# Patient Record
Sex: Female | Born: 1935 | Race: White | Hispanic: No | Smoking: Never smoker
Health system: Southern US, Community
[De-identification: ages and names within clinical notes are randomized; demographics above are authoritative.]

## PROBLEM LIST (undated history)

## (undated) DIAGNOSIS — I4819 Other persistent atrial fibrillation: Secondary | ICD-10-CM

## (undated) DIAGNOSIS — I2510 Atherosclerotic heart disease of native coronary artery without angina pectoris: Secondary | ICD-10-CM

## (undated) DIAGNOSIS — I119 Hypertensive heart disease without heart failure: Secondary | ICD-10-CM

## (undated) DIAGNOSIS — M1990 Unspecified osteoarthritis, unspecified site: Secondary | ICD-10-CM

## (undated) DIAGNOSIS — Z8639 Personal history of other endocrine, nutritional and metabolic disease: Secondary | ICD-10-CM

## (undated) DIAGNOSIS — I4821 Permanent atrial fibrillation: Secondary | ICD-10-CM

## (undated) DIAGNOSIS — N183 Chronic kidney disease, stage 3 unspecified: Secondary | ICD-10-CM

## (undated) DIAGNOSIS — K8020 Calculus of gallbladder without cholecystitis without obstruction: Secondary | ICD-10-CM

## (undated) DIAGNOSIS — C50919 Malignant neoplasm of unspecified site of unspecified female breast: Secondary | ICD-10-CM

## (undated) DIAGNOSIS — I10 Essential (primary) hypertension: Secondary | ICD-10-CM

## (undated) DIAGNOSIS — K219 Gastro-esophageal reflux disease without esophagitis: Secondary | ICD-10-CM

## (undated) DIAGNOSIS — E7800 Pure hypercholesterolemia, unspecified: Secondary | ICD-10-CM

## (undated) DIAGNOSIS — I5042 Chronic combined systolic (congestive) and diastolic (congestive) heart failure: Secondary | ICD-10-CM

## (undated) DIAGNOSIS — I495 Sick sinus syndrome: Secondary | ICD-10-CM

## (undated) HISTORY — DX: Malignant neoplasm of unspecified site of unspecified female breast: C50.919

## (undated) HISTORY — DX: Hypertensive heart disease without heart failure: I11.9

## (undated) HISTORY — PX: COLONOSCOPY: SHX174

## (undated) HISTORY — DX: Other persistent atrial fibrillation: I48.19

## (undated) HISTORY — DX: Gastro-esophageal reflux disease without esophagitis: K21.9

## (undated) HISTORY — DX: Atherosclerotic heart disease of native coronary artery without angina pectoris: I25.10

## (undated) HISTORY — DX: Chronic combined systolic (congestive) and diastolic (congestive) heart failure: I50.42

## (undated) HISTORY — DX: Personal history of other endocrine, nutritional and metabolic disease: Z86.39

## (undated) HISTORY — DX: Pure hypercholesterolemia, unspecified: E78.00

## (undated) HISTORY — DX: Essential (primary) hypertension: I10

## (undated) HISTORY — DX: Sick sinus syndrome: I49.5

## (undated) HISTORY — DX: Unspecified osteoarthritis, unspecified site: M19.90

## (undated) HISTORY — DX: Permanent atrial fibrillation: I48.21

---

## 1990-11-03 HISTORY — PX: MASTECTOMY: SHX3

## 1998-02-21 ENCOUNTER — Other Ambulatory Visit: Admission: RE | Admit: 1998-02-21 | Discharge: 1998-02-21 | Payer: Self-pay | Admitting: Oncology

## 2000-03-06 ENCOUNTER — Encounter: Admission: RE | Admit: 2000-03-06 | Discharge: 2000-03-06 | Payer: Self-pay | Admitting: Oncology

## 2000-03-06 ENCOUNTER — Encounter: Payer: Self-pay | Admitting: Oncology

## 2000-08-28 ENCOUNTER — Emergency Department (HOSPITAL_COMMUNITY): Admission: EM | Admit: 2000-08-28 | Discharge: 2000-08-28 | Payer: Self-pay | Admitting: Emergency Medicine

## 2002-08-31 ENCOUNTER — Encounter: Payer: Self-pay | Admitting: Cardiology

## 2002-08-31 ENCOUNTER — Encounter: Admission: RE | Admit: 2002-08-31 | Discharge: 2002-08-31 | Payer: Self-pay | Admitting: Cardiology

## 2002-09-07 ENCOUNTER — Encounter: Admission: RE | Admit: 2002-09-07 | Discharge: 2002-12-06 | Payer: Self-pay | Admitting: Cardiology

## 2002-12-14 ENCOUNTER — Encounter: Admission: RE | Admit: 2002-12-14 | Discharge: 2003-03-14 | Payer: Self-pay | Admitting: Cardiology

## 2003-08-23 ENCOUNTER — Emergency Department (HOSPITAL_COMMUNITY): Admission: EM | Admit: 2003-08-23 | Discharge: 2003-08-23 | Payer: Self-pay | Admitting: Emergency Medicine

## 2003-11-01 ENCOUNTER — Inpatient Hospital Stay (HOSPITAL_COMMUNITY): Admission: EM | Admit: 2003-11-01 | Discharge: 2003-11-03 | Payer: Self-pay | Admitting: Emergency Medicine

## 2004-01-04 ENCOUNTER — Emergency Department (HOSPITAL_COMMUNITY): Admission: AD | Admit: 2004-01-04 | Discharge: 2004-01-04 | Payer: Self-pay | Admitting: Family Medicine

## 2005-05-01 ENCOUNTER — Encounter: Admission: RE | Admit: 2005-05-01 | Discharge: 2005-05-01 | Payer: Self-pay | Admitting: Cardiology

## 2009-03-23 ENCOUNTER — Encounter (HOSPITAL_COMMUNITY): Admission: RE | Admit: 2009-03-23 | Discharge: 2009-06-06 | Payer: Self-pay | Admitting: Cardiology

## 2009-07-04 ENCOUNTER — Inpatient Hospital Stay (HOSPITAL_COMMUNITY): Admission: EM | Admit: 2009-07-04 | Discharge: 2009-07-06 | Payer: Self-pay | Admitting: Emergency Medicine

## 2009-07-05 HISTORY — PX: CARDIAC CATHETERIZATION: SHX172

## 2010-06-20 ENCOUNTER — Ambulatory Visit: Payer: Self-pay | Admitting: Cardiology

## 2010-06-27 ENCOUNTER — Encounter: Admission: RE | Admit: 2010-06-27 | Discharge: 2010-06-27 | Payer: Self-pay | Admitting: Cardiology

## 2010-07-04 HISTORY — PX: INSERT / REPLACE / REMOVE PACEMAKER: SUR710

## 2010-07-14 ENCOUNTER — Ambulatory Visit: Payer: Self-pay | Admitting: Internal Medicine

## 2010-07-14 ENCOUNTER — Ambulatory Visit: Payer: Self-pay | Admitting: Cardiology

## 2010-07-14 ENCOUNTER — Inpatient Hospital Stay (HOSPITAL_COMMUNITY): Admission: EM | Admit: 2010-07-14 | Discharge: 2010-07-16 | Payer: Self-pay | Admitting: Emergency Medicine

## 2010-07-15 ENCOUNTER — Ambulatory Visit: Payer: Self-pay | Admitting: Surgery

## 2010-07-15 ENCOUNTER — Encounter (INDEPENDENT_AMBULATORY_CARE_PROVIDER_SITE_OTHER): Payer: Self-pay | Admitting: Internal Medicine

## 2010-07-16 ENCOUNTER — Encounter (INDEPENDENT_AMBULATORY_CARE_PROVIDER_SITE_OTHER): Payer: Self-pay | Admitting: Internal Medicine

## 2010-07-18 ENCOUNTER — Encounter: Payer: Self-pay | Admitting: Internal Medicine

## 2010-07-26 ENCOUNTER — Ambulatory Visit: Payer: Self-pay | Admitting: Internal Medicine

## 2010-09-02 ENCOUNTER — Ambulatory Visit: Payer: Self-pay | Admitting: Cardiology

## 2010-10-21 ENCOUNTER — Ambulatory Visit: Payer: Self-pay | Admitting: Cardiology

## 2010-10-22 DIAGNOSIS — M1990 Unspecified osteoarthritis, unspecified site: Secondary | ICD-10-CM | POA: Insufficient documentation

## 2010-10-22 DIAGNOSIS — E7800 Pure hypercholesterolemia, unspecified: Secondary | ICD-10-CM

## 2010-10-22 DIAGNOSIS — E1140 Type 2 diabetes mellitus with diabetic neuropathy, unspecified: Secondary | ICD-10-CM | POA: Insufficient documentation

## 2010-10-22 DIAGNOSIS — I10 Essential (primary) hypertension: Secondary | ICD-10-CM | POA: Insufficient documentation

## 2010-10-22 DIAGNOSIS — E119 Type 2 diabetes mellitus without complications: Secondary | ICD-10-CM

## 2010-10-22 DIAGNOSIS — I209 Angina pectoris, unspecified: Secondary | ICD-10-CM

## 2010-10-22 DIAGNOSIS — I495 Sick sinus syndrome: Secondary | ICD-10-CM | POA: Insufficient documentation

## 2010-10-23 ENCOUNTER — Ambulatory Visit: Payer: Self-pay | Admitting: Internal Medicine

## 2010-12-03 NOTE — Miscellaneous (Signed)
Summary: Device preload  Clinical Lists Changes  Observations: Added new observation of PPM INDICATN: Brady (07/18/2010 13:00) Added new observation of MAGNET RTE: BOL 85 ERI 65 (07/18/2010 13:00) Added new observation of PPMLEADSTAT2: active (07/18/2010 13:00) Added new observation of PPMLEADSER2: [id number] (07/18/2010 13:00) Added new observation of PPMLEADMOD2: 5092  (07/18/2010 13:00) Added new observation of PPMLEADLOC2: RV  (07/18/2010 13:00) Added new observation of PPMLEADSTAT1: active  (07/18/2010 13:00) Added new observation of PPMLEADSER1: [id number]  (07/18/2010 13:00) Added new observation of PPMLEADMOD1: 5076  (07/18/2010 13:00) Added new observation of PPMLEADLOC1: RA  (07/18/2010 13:00) Added new observation of PPM IMP MD: Hillis Range, MD  (07/18/2010 13:00) Added new observation of PPMLEADDOI2: 07/15/2010  (07/18/2010 13:00) Added new observation of PPMLEADDOI1: 07/15/2010  (07/18/2010 13:00) Added new observation of PPM DOI: 07/15/2010  (07/18/2010 13:00) Added new observation of PPM SERL#: [id number]  (07/18/2010 13:00) Added new observation of PPM MODL#: [id number]  (07/18/2010 63:87) Added new observation of PACEMAKERMFG: Medtronic  (07/18/2010 13:00) Added new observation of PACEMAKER MD: Hillis Range, MD  (07/18/2010 13:00)      PPM Specifications Following MD:  Hillis Range, MD     PPM Vendor:  Medtronic     PPM Model Number:  ADDRL1     PPM Serial Number:  [id number] PPM DOI:  07/15/2010     PPM Implanting MD:  Hillis Range, MD  Lead 1    Location: RA     DOI: 07/15/2010     Model #: [id number]     Serial #: [id number]     Status: active Lead 2    Location: RV     DOI: 07/15/2010     Model #: [id number]     Serial #: [id number]     Status: active  Magnet Response Rate:  BOL 85 ERI 65  Indications:  Huston Foley

## 2010-12-03 NOTE — Procedures (Signed)
Summary: WOUND CHECK/D.MILLER   Current Medications (verified): 1)  Carvedilol 6.25 Mg Tabs (Carvedilol) .... One By Mouth Two Times A Day 2)  Amitiza 24 Mcg Caps (Lubiprostone) .... One By Mouth Two Times A Day 3)  Aspir-Low 81 Mg Tbec (Aspirin) .... One By Mouth Daily 4)  Calcium Carbonate-Vitamin D 6000 Mg-Unit Caps (Calcium Carbonate-Vitamin D) .... One By Mouth Daily 5)  Pepcid 20 Mg Tabs (Famotidine) .... One By Mouth Two Times A Day 6)  Fish Oil Maximum Strength 1200 Mg Caps (Omega-3 Fatty Acids) .... One By Mouth Daily 7)  Metformin Hcl 500 Mg Tabs (Metformin Hcl) .... One By Mouth Daily 8)  Nifedipine 30 Mg Xr24h-Tab (Nifedipine) .... One By Mouth Daily 9)  Plavix 75 Mg Tabs (Clopidogrel Bisulfate) .... One By Mouth Daily 10)  Vitamin B-12 1000 Mcg Tabs (Cyanocobalamin) .... One By Mouth Daily 11)  Nitrostat 0.4 Mg Subl (Nitroglycerin) .... As Needed  Allergies (verified): 1)  ! Sulfa  PPM Specifications Following MD:  Hillis Range, MD     PPM Vendor:  Medtronic     PPM Model Number:  ADDRL1     PPM Serial Number:  [id number] PPM DOI:  07/15/2010     PPM Implanting MD:  Hillis Range, MD  Lead 1    Location: RA     DOI: 07/15/2010     Model #: 4098     Serial #: [id number]     Status: active Lead 2    Location: RV     DOI: 07/15/2010     Model #: 6213     Serial #: [id number]     Status: active  Magnet Response Rate:  BOL 85 ERI 65  Indications:  Huston Foley   PPM Follow Up Remote Check?  No Battery Voltage:  2.79 V     Battery Est. Longevity:  11.5 years     Pacer Dependent:  Yes       PPM Device Measurements Atrium  Impedance: 665 ohms, Threshold: 0.75 V at 0.4 msec Right Ventricle  Amplitude: 5.6 mV, Impedance: 708 ohms, Threshold: 0.5 V at 0.4 msec  Episodes MS Episodes:  0     Percent Mode Switch:  0     Coumadin:  No Ventricular High Rate:  0     Atrial Pacing:  90.2%     Ventricular Pacing:  0.2%  Parameters Mode:  DDDR+     Lower Rate Limit:  60     Upper Rate  Limit:  120 Paced AV Delay:  180     Sensed AV Delay:  150 Next Cardiology Appt Due:  10/23/2010 Tech Comments:  Steri strips removed, no redness or edema noted.  Device function normal. No parameter changes.  ROV 12/21 with Dr. Johney Frame. Altha Harm, LPN  July 26, 2010 10:27 AM

## 2010-12-03 NOTE — Cardiovascular Report (Signed)
Summary: Office Visit   Office Visit   Imported By: Roderic Ovens 07/31/2010 15:43:49  _____________________________________________________________________  External Attachment:    Type:   Image     Comment:   External Document

## 2010-12-05 ENCOUNTER — Ambulatory Visit (INDEPENDENT_AMBULATORY_CARE_PROVIDER_SITE_OTHER): Payer: Medicare Other | Admitting: Cardiology

## 2010-12-05 DIAGNOSIS — E119 Type 2 diabetes mellitus without complications: Secondary | ICD-10-CM

## 2010-12-05 DIAGNOSIS — I119 Hypertensive heart disease without heart failure: Secondary | ICD-10-CM

## 2010-12-05 NOTE — Assessment & Plan Note (Signed)
Summary: 3 MONTH/PER AMBER/D.MILLER   Visit Type:  Follow-up   History of Present Illness: The patient presents today for routine electrophysiology followup. She reports doing very well since having her pacemaker implanted.  Her energy has improved.  The patient denies symptoms of palpitations, chest pain, shortness of breath, orthopnea, PND, lower extremity edema, dizziness, presyncope, syncope, or neurologic sequela. The patient is tolerating medications without difficulties and is otherwise without complaint today.   Current Medications (verified): 1)  Carvedilol 6.25 Mg Tabs (Carvedilol) .... One By Mouth Two Times A Day 2)  Amitiza 24 Mcg Caps (Lubiprostone) .... One By Mouth Two Times A Day 3)  Aspir-Low 81 Mg Tbec (Aspirin) .... One By Mouth Daily 4)  Calcium Carbonate-Vitamin D 6000 Mg-Unit Caps (Calcium Carbo .... One By Mouth Daily 5)  Pepcid 20 Mg Tabs (Famotidine) .... One By Mouth Two Times A Day 6)  Fish Oil Maximum Strength 1200 Mg Caps (Omega-3 Fatty Acids) .... One By Mouth Daily 7)  Metformin Hcl 500 Mg Tabs (Metformin Hcl) .... One By Mouth Daily 8)  Nifedipine 30 Mg Xr24h-Tab (Nifedipine) .... One By Mouth Daily 9)  Plavix 75 Mg Tabs (Clopidogrel Bisulfate) .... One By Mouth Daily 10)  Vitamin B-12 1000 Mcg Tabs (Cyanocobalamin) .... One By Mouth Daily 11)  Nitrostat 0.4 Mg Subl (Nitroglycerin) .... As Needed 12)  Crestor 10 Mg Tabs (Rosuvastatin Calcium) .... Take One Tablet By Mouth Daily.  Allergies: 1)  ! Sulfa  Past History:  Past Medical History: Reviewed history from 10/22/2010 and no changes required. Current Problems:  SICK SINUS SYNDROME (ICD-427.81) DEGENERATIVE JOINT DISEASE (ICD-715.90) HYPERCHOLESTEROLEMIA (ICD-272.0) DM (ICD-250.00) HYPERTENSION (ICD-401.9) CAD (ICD-414.00)    Past Surgical History: s/p PPM 9/11  Vital Signs:  Patient profile:   77 year old female Height:      64 inches Weight:      164 pounds BMI:     28.25 Pulse  rate:   78 / minute BP sitting:   150 / 80  (left arm)  Vitals Entered By: Laurance Flatten CMA (October 23, 2010 9:42 AM)  Physical Exam  General:  NAD Head:  normocephalic and atraumatic Eyes:  PERRLA/EOM intact; conjunctiva and lids normal. Mouth:  Teeth, gums and palate normal. Oral mucosa normal. Neck:  supple Chest Wall:  pacemaker pocket is well healed Lungs:  Clear bilaterally to auscultation and percussion. Heart:  Non-displaced PMI, chest non-tender; regular rate and rhythm, S1, S2 without murmurs, rubs or gallops. Carotid upstroke normal, no bruit. Normal abdominal aortic size, no bruits. Femorals normal pulses, no bruits. Pedals normal pulses. No edema, no varicosities. Abdomen:  Bowel sounds positive; abdomen soft and non-tender without masses, organomegaly, or hernias noted. No hepatosplenomegaly. Msk:  Back normal, normal gait. Muscle strength and tone normal. Extremities:  No clubbing or cyanosis. Neurologic:  Alert and oriented x 3.   PPM Specifications Following MD:  Hillis Range, MD     PPM Vendor:  [employer]tronic     PPM Model Number:  ADDRL1     PPM Serial Number:  [id number] [facility] PPM DOI:  07/15/2010     PPM Implanting MD:  Hillis Range, MD  Lead 1    Location: RA     DOI: 07/15/2010     Model #: 6606     Serial #: [id number]     Status: active Lead 2    Location: RV     DOI: 07/15/2010     Model #: 3557     Serial #:  [id number]     Status: active  Magnet Response Rate:  BOL 85 ERI 65  Indications:  Huston Foley   PPM Follow Up Remote Check?  No Battery Voltage:  2.79 V     Battery Est. Longevity:  11 years     Pacer Dependent:  Yes       PPM Device Measurements Atrium  Impedance: 613 ohms, Threshold: 0.625 V at 0.4 msec Right Ventricle  Amplitude: 5.6 mV, Impedance: 693 ohms, Threshold: 0.5 V at 0.4 msec  Episodes MS Episodes:  4     Percent Mode Switch:  <0.1%     Coumadin:  No Ventricular High Rate:  0     Atrial Pacing:  90.5%     Ventricular Pacing:   1.6%  Parameters Mode:  DDDR+     Lower Rate Limit:  60     Upper Rate Limit:  120 Paced AV Delay:  180     Sensed AV Delay:  150 Tech Comments:  Outputs reprogrammed for chronic thresholds.  Checked by [employer]. ROV 9/12 with Dr. Johney Frame. Altha Harm, LPN  October 23, 2010 10:49 AM  MD Comments:  normal pacemaker function  Impression & Recommendations:  Problem # 1:  SICK SINUS SYNDROME (ICD-427.81) doing well s/p PPM as above  Problem # 2:  HYPERTENSION (ICD-401.9) above goal pt to follow-up with Dr Patty Sermons  Patient Instructions: 1)  Your physician wants you to follow-up in: Sept 2012   You will receive a reminder letter in the mail two months in advance. If you don't receive a letter, please call our office to schedule the follow-up appointment. 2)  Your physician recommends that you continue on your current medications as directed. Please refer to the Current Medication list given to you today.

## 2010-12-05 NOTE — Cardiovascular Report (Signed)
Summary: Office Visit   Office Visit   Imported By: Roderic Ovens 10/29/2010 11:10:01  _____________________________________________________________________  External Attachment:    Type:   Image     Comment:   External Document

## 2010-12-18 ENCOUNTER — Other Ambulatory Visit (INDEPENDENT_AMBULATORY_CARE_PROVIDER_SITE_OTHER): Payer: Medicare Other

## 2010-12-18 DIAGNOSIS — E7800 Pure hypercholesterolemia, unspecified: Secondary | ICD-10-CM

## 2010-12-18 DIAGNOSIS — I2510 Atherosclerotic heart disease of native coronary artery without angina pectoris: Secondary | ICD-10-CM

## 2010-12-18 DIAGNOSIS — E119 Type 2 diabetes mellitus without complications: Secondary | ICD-10-CM

## 2011-01-16 LAB — DIFFERENTIAL
Basophils Absolute: 0 10*3/uL (ref 0.0–0.1)
Basophils Relative: 0 % (ref 0–1)
Eosinophils Absolute: 0.1 10*3/uL (ref 0.0–0.7)
Eosinophils Relative: 1 % (ref 0–5)
Lymphocytes Relative: 21 % (ref 12–46)
Lymphs Abs: 2.2 10*3/uL (ref 0.7–4.0)
Monocytes Absolute: 0.6 10*3/uL (ref 0.1–1.0)
Monocytes Relative: 6 % (ref 3–12)
Neutro Abs: 7.6 10*3/uL (ref 1.7–7.7)
Neutrophils Relative %: 73 % (ref 43–77)

## 2011-01-16 LAB — BASIC METABOLIC PANEL
BUN: 11 mg/dL (ref 6–23)
BUN: 14 mg/dL (ref 6–23)
CO2: 23 mEq/L (ref 19–32)
CO2: 25 mEq/L (ref 19–32)
Calcium: 9.2 mg/dL (ref 8.4–10.5)
Chloride: 109 mEq/L (ref 96–112)
Chloride: 112 mEq/L (ref 96–112)
Creatinine, Ser: 0.83 mg/dL (ref 0.4–1.2)
GFR calc Af Amer: >60 mL/min (ref >60)
GFR calc non Af Amer: >60 mL/min (ref >60)
Glucose, Bld: 111 mg/dL — ABNORMAL HIGH (ref 70–99)
Glucose, Bld: 128 mg/dL — ABNORMAL HIGH (ref 70–99)
Potassium: 3.3 mEq/L — ABNORMAL LOW (ref 3.5–5.1)
Potassium: 3.3 mEq/L — ABNORMAL LOW (ref 3.5–5.1)
Sodium: 140 mEq/L (ref 135–145)

## 2011-01-16 LAB — CBC
HCT: 36.6 % (ref 36.0–46.0)
HCT: 40.8 % (ref 36.0–46.0)
Hemoglobin: 13.1 g/dL (ref 12.0–15.0)
Hemoglobin: 14.7 g/dL (ref 12.0–15.0)
MCH: 29.8 pg (ref 26.0–34.0)
MCH: 30.6 pg (ref 26.0–34.0)
MCHC: 35.8 g/dL (ref 30.0–36.0)
MCHC: 36 g/dL (ref 30.0–36.0)
MCV: 83.4 fL (ref 78.0–100.0)
MCV: 84.8 fL (ref 78.0–100.0)
Platelets: 276 10*3/uL (ref 150–400)
RBC: 4.81 MIL/uL (ref 3.87–5.11)
RDW: 13.6 % (ref 11.5–15.5)
RDW: 13.7 % (ref 11.5–15.5)
WBC: 10.5 10*3/uL (ref 4.0–10.5)

## 2011-01-16 LAB — CK TOTAL AND CKMB (NOT AT ARMC)
CK, MB: 1.3 ng/mL (ref 0.3–4.0)
CK, MB: 1.3 ng/mL (ref 0.3–4.0)
CK, MB: 1.4 ng/mL (ref 0.3–4.0)
Relative Index: INVALID (ref 0.0–2.5)
Relative Index: INVALID (ref 0.0–2.5)
Relative Index: INVALID (ref 0.0–2.5)
Total CK: 46 U/L (ref 7–177)
Total CK: 48 U/L (ref 7–177)
Total CK: 52 U/L (ref 7–177)

## 2011-01-16 LAB — URINALYSIS, ROUTINE W REFLEX MICROSCOPIC
Bilirubin Urine: NEGATIVE
Glucose, UA: NEGATIVE mg/dL
Hgb urine dipstick: NEGATIVE
Ketones, ur: NEGATIVE mg/dL
Nitrite: NEGATIVE
Protein, ur: NEGATIVE mg/dL
Specific Gravity, Urine: 1.007 (ref 1.005–1.030)
Urobilinogen, UA: 0.2 mg/dL (ref 0.0–1.0)
pH: 7.5 (ref 5.0–8.0)

## 2011-01-16 LAB — GLUCOSE, CAPILLARY
Glucose-Capillary: 101 mg/dL — ABNORMAL HIGH (ref 70–99)
Glucose-Capillary: 125 mg/dL — ABNORMAL HIGH (ref 70–99)
Glucose-Capillary: 126 mg/dL — ABNORMAL HIGH (ref 70–99)
Glucose-Capillary: 135 mg/dL — ABNORMAL HIGH (ref 70–99)
Glucose-Capillary: 152 mg/dL — ABNORMAL HIGH (ref 70–99)
Glucose-Capillary: 176 mg/dL — ABNORMAL HIGH (ref 70–99)
Glucose-Capillary: 98 mg/dL (ref 70–99)

## 2011-01-16 LAB — HEMOGLOBIN A1C
Hgb A1c MFr Bld: 6.1 % — ABNORMAL HIGH (ref <5.7)
Mean Plasma Glucose: 128 mg/dL — ABNORMAL HIGH (ref <117)

## 2011-01-16 LAB — COMPREHENSIVE METABOLIC PANEL
ALT: 16 U/L (ref 0–35)
AST: 22 U/L (ref 0–37)
Albumin: 3.9 g/dL (ref 3.5–5.2)
Alkaline Phosphatase: 86 U/L (ref 39–117)
BUN: 15 mg/dL (ref 6–23)
CO2: 22 mEq/L (ref 19–32)
Calcium: 9.6 mg/dL (ref 8.4–10.5)
Chloride: 108 mEq/L (ref 96–112)
Creatinine, Ser: 1.32 mg/dL — ABNORMAL HIGH (ref 0.4–1.2)
GFR calc Af Amer: 47 mL/min — ABNORMAL LOW (ref >60)
GFR calc non Af Amer: 39 mL/min — ABNORMAL LOW (ref >60)
Glucose, Bld: 164 mg/dL — ABNORMAL HIGH (ref 70–99)
Potassium: 3.2 mEq/L — ABNORMAL LOW (ref 3.5–5.1)
Sodium: 138 mEq/L (ref 135–145)
Total Bilirubin: 0.9 mg/dL (ref 0.3–1.2)
Total Protein: 6.8 g/dL (ref 6.0–8.3)

## 2011-01-16 LAB — TSH: TSH: 1.84 u[IU]/mL (ref 0.350–4.500)

## 2011-01-16 LAB — POCT CARDIAC MARKERS
CKMB, poc: <1 ng/mL — ABNORMAL LOW (ref 1.0–8.0)
CKMB, poc: <1 ng/mL — ABNORMAL LOW (ref 1.0–8.0)
Myoglobin, poc: 71.8 ng/mL (ref 12–200)
Myoglobin, poc: 86.4 ng/mL (ref 12–200)
Troponin i, poc: <0.05 ng/mL (ref 0.00–0.09)
Troponin i, poc: <0.05 ng/mL (ref 0.00–0.09)

## 2011-01-16 LAB — TROPONIN I
Troponin I: 0.04 ng/mL (ref 0.00–0.06)
Troponin I: 0.04 ng/mL (ref 0.00–0.06)

## 2011-01-16 LAB — HEMOCCULT GUIAC POC 1CARD (OFFICE): Fecal Occult Bld: NEGATIVE

## 2011-02-07 LAB — DIFFERENTIAL
Basophils Absolute: 0 10*3/uL (ref 0.0–0.1)
Basophils Relative: 1 % (ref 0–1)
Eosinophils Absolute: 0 10*3/uL (ref 0.0–0.7)
Monocytes Absolute: 0.6 10*3/uL (ref 0.1–1.0)
Monocytes Relative: 7 % (ref 3–12)
Neutro Abs: 5.6 10*3/uL (ref 1.7–7.7)

## 2011-02-07 LAB — LIPID PANEL
Cholesterol: 142 mg/dL (ref 0–200)
HDL: 35 mg/dL — ABNORMAL LOW (ref >39)
LDL Cholesterol: 91 mg/dL (ref 0–99)
Total CHOL/HDL Ratio: 4.1 RATIO

## 2011-02-07 LAB — CBC
HCT: 38.6 % (ref 36.0–46.0)
Hemoglobin: 13.1 g/dL (ref 12.0–15.0)
Hemoglobin: 13.6 g/dL (ref 12.0–15.0)
MCHC: 34 g/dL (ref 30.0–36.0)
MCHC: 34.4 g/dL (ref 30.0–36.0)
MCV: 88.2 fL (ref 78.0–100.0)
MCV: 88.4 fL (ref 78.0–100.0)
MCV: 89.7 fL (ref 78.0–100.0)
Platelets: 211 10*3/uL (ref 150–400)
Platelets: 213 10*3/uL (ref 150–400)
RBC: 4.1 MIL/uL (ref 3.87–5.11)
RBC: 4.31 MIL/uL (ref 3.87–5.11)
RBC: 4.48 MIL/uL (ref 3.87–5.11)
RDW: 14.3 % (ref 11.5–15.5)
RDW: 14.4 % (ref 11.5–15.5)
WBC: 8.4 10*3/uL (ref 4.0–10.5)
WBC: 8.9 10*3/uL (ref 4.0–10.5)

## 2011-02-07 LAB — BASIC METABOLIC PANEL
BUN: 10 mg/dL (ref 6–23)
CO2: 21 mEq/L (ref 19–32)
CO2: 25 mEq/L (ref 19–32)
Calcium: 8 mg/dL — ABNORMAL LOW (ref 8.4–10.5)
Calcium: 9 mg/dL (ref 8.4–10.5)
Chloride: 105 mEq/L (ref 96–112)
Chloride: 116 mEq/L — ABNORMAL HIGH (ref 96–112)
Creatinine, Ser: 0.86 mg/dL (ref 0.4–1.2)
Creatinine, Ser: 0.95 mg/dL (ref 0.4–1.2)
GFR calc Af Amer: >60 mL/min (ref >60)
GFR calc Af Amer: >60 mL/min (ref >60)
GFR calc non Af Amer: >60 mL/min (ref >60)
Glucose, Bld: 121 mg/dL — ABNORMAL HIGH (ref 70–99)
Glucose, Bld: 133 mg/dL — ABNORMAL HIGH (ref 70–99)
Potassium: 3.6 mEq/L (ref 3.5–5.1)
Sodium: 138 mEq/L (ref 135–145)
Sodium: 142 mEq/L (ref 135–145)

## 2011-02-07 LAB — GLUCOSE, CAPILLARY
Glucose-Capillary: 114 mg/dL — ABNORMAL HIGH (ref 70–99)
Glucose-Capillary: 133 mg/dL — ABNORMAL HIGH (ref 70–99)
Glucose-Capillary: 94 mg/dL (ref 70–99)
Glucose-Capillary: 97 mg/dL (ref 70–99)

## 2011-02-07 LAB — CARDIAC PANEL(CRET KIN+CKTOT+MB+TROPI)
CK, MB: 0.8 ng/mL (ref 0.3–4.0)
CK, MB: 0.9 ng/mL (ref 0.3–4.0)
Relative Index: INVALID (ref 0.0–2.5)

## 2011-02-07 LAB — CK TOTAL AND CKMB (NOT AT ARMC)
CK, MB: 0.9 ng/mL (ref 0.3–4.0)
CK, MB: 1.2 ng/mL (ref 0.3–4.0)
Total CK: 34 U/L (ref 7–177)
Total CK: 41 U/L (ref 7–177)

## 2011-02-07 LAB — COMPREHENSIVE METABOLIC PANEL
Alkaline Phosphatase: 87 U/L (ref 39–117)
BUN: 14 mg/dL (ref 6–23)
Calcium: 8.5 mg/dL (ref 8.4–10.5)
GFR calc Af Amer: >60 mL/min (ref >60)
GFR calc non Af Amer: >60 mL/min (ref >60)
Glucose, Bld: 125 mg/dL — ABNORMAL HIGH (ref 70–99)
Sodium: 141 mEq/L (ref 135–145)
Total Protein: 6.1 g/dL (ref 6.0–8.3)

## 2011-02-07 LAB — URINE MICROSCOPIC-ADD ON

## 2011-02-07 LAB — TROPONIN I: Troponin I: 0.02 ng/mL (ref 0.00–0.06)

## 2011-02-07 LAB — URINALYSIS, ROUTINE W REFLEX MICROSCOPIC
Nitrite: NEGATIVE
Protein, ur: NEGATIVE mg/dL
Specific Gravity, Urine: 1.007 (ref 1.005–1.030)
Urobilinogen, UA: 0.2 mg/dL (ref 0.0–1.0)

## 2011-02-07 LAB — PROTIME-INR
INR: 0.9 (ref 0.00–1.49)
Prothrombin Time: 12.4 seconds (ref 11.6–15.2)

## 2011-02-07 LAB — APTT: aPTT: 26 seconds (ref 24–37)

## 2011-02-26 ENCOUNTER — Other Ambulatory Visit: Payer: Self-pay | Admitting: Cardiology

## 2011-02-26 DIAGNOSIS — E785 Hyperlipidemia, unspecified: Secondary | ICD-10-CM

## 2011-02-26 NOTE — Telephone Encounter (Signed)
Fax received from pharmacy. Refill completed. Jodette Shamia Uppal RN  

## 2011-02-27 ENCOUNTER — Other Ambulatory Visit: Payer: Self-pay | Admitting: Cardiology

## 2011-02-27 DIAGNOSIS — F419 Anxiety disorder, unspecified: Secondary | ICD-10-CM

## 2011-02-27 NOTE — Telephone Encounter (Signed)
Needs a refill of Crestor and Lorazetan. [facility] on [address] [phone]. I can't find the chart.

## 2011-02-28 NOTE — Telephone Encounter (Signed)
Do you fill her lorazepam?  I didn't see anything in the chart.

## 2011-03-02 NOTE — Telephone Encounter (Signed)
Okay to refill lorazepam

## 2011-03-03 MED ORDER — LORAZEPAM 1 MG PO TABS: 1.0000 mg | ORAL_TABLET | Freq: Three times a day (TID) | ORAL | Status: AC

## 2011-03-03 NOTE — Telephone Encounter (Signed)
Refilled per patient request. 

## 2011-03-18 NOTE — Cardiovascular Report (Signed)
NAME:  GRETE, BOSKO                ACCOUNT NO.:  [id number]   MEDICAL RECORD NO.:  [id number]          PATIENT TYPE:  INP   LOCATION:  2501                         FACILITY:  [facility]   PHYSICIAN:  Mohan N. Sharyn Lull, M.D. DATE OF BIRTH:  10-21-1927   DATE OF PROCEDURE:  07/05/2009  DATE OF DISCHARGE:                            CARDIAC CATHETERIZATION   PROCEDURES:  1. Left cardiac catheterization with selective left and right coronary      angiography, left ventricular graft via right groin using Judkins      technique.  2. Successful insertion of saphenous pacemaker via right femoral      venous approach.  3. Successful percutaneous transluminal coronary angioplasty to mid      and distal right coronary artery using 2.5 x 12 mm long Voyager      balloon.  4. Successful deployment of 2.5 x 15 mm long Xience V stent in distal      right coronary artery.  5. Successful post-dilatation of this stent using 2.75 x 12 mm long      Noncompliant Voyager balloon.  6. Successful deployment of 3.5 x 28 mm long Xience V stent in mid      right coronary artery.  7. Successful post-dilatation of this stent using 3.75 x 20 mm long      Noncompliant Voyager balloon   INDICATIONS FOR PROCEDURE:  Ms. Magnussen is an 77 year old white female  with past medical history significant for hypertension, non-insulin-  dependent diabetes mellitus, hypercholesteremia, degenerative joint  disease, diabetic neuropathy, positive family history of coronary artery  disease complained of left-sided chest pain radiating across the chest  lasting approximately 20 minutes.  Chest pain was rated 6/10 associated  with mild shortness of breath.  Denies any nausea, vomiting,  diaphoresis.  Denies palpitation, lightheadedness, or syncope.  Denies  PND, orthopnea, leg swelling.  The patient also gives history of  exertional dyspnea associated with feeling weak and tired.  The patient  recently had Persantine Myoview in May 2010  which showed no evidence of  ischemia.  The patient also complains of cough with nasal congestion and  postnasal drip.  The patient denies any relation of chest pain to food,  breathing, coughing.  The patient was noted to have 2.1-second pause in  the ED and nonsustained VT for which the patient was asymptomatic.  Due  to typical anginal chest pain, multiple risk factors discussed with the  patient and her daughter regarding left cath possible, PTCA stenting,  its risks and benefits, i.e., death, MI, stroke, need for emergency  CABG, local vascular complications, etc., and consented for the  procedure.   PROCEDURE:  After obtaining the informed consent, the patient was  brought to the Cath Lab and was placed on fluoroscopy table.  Right  groin was prepped and draped in usual fashion.  A 1% Xylocaine was used  for local anesthesia.  With the help of thin-wall needle, a 6-French  arterial sheath was placed.  Sheath was aspirated and flushed.  Next, 6-  French left Judkins catheter was advanced over the wire under  fluoroscopic guidance up to the ascending aorta.  Wire was pulled out.  The catheter was aspirated and connected to the manifold.  Catheter was  further advanced and engaged into left coronary ostium.  Multiple views  of the left system were taken.  Next, catheter was disengaged and was  pulled out over the wire and was replaced with 6-French right Judkins  catheter which was advanced over the wire under fluoroscopic guidance up  to the ascending aorta.  Wire was pulled out.  The catheter was  aspirated and connected to the manifold.  Catheter was further advanced  and engaged into right coronary ostium.  Multiple views of the right  system were taken.  Next, catheter was disengaged and was pulled out  over the wire and was replaced with a 6-French pigtail catheter which  was advanced over the wire under fluoroscopic guidance up to the  ascending aorta.  Wire was pulled out.   The catheter was aspirated and  connected to the manifold.  Catheter was further advanced across the  aortic valve and to the LV.  LV pressures were recorded.  Next, LV graft  was done in 30-degrees RAO position.  Post-angiographic pressures were  recorded from LV and then pullback pressures were recorded from the  aorta.  There was no gradient across the aortic valve.  Next, the  pigtail catheter was pulled out over the wire.  Sheaths were aspirated  and flushed.  Of note, the patient had episodes of bradycardia during  coronary injections, especially in RCA with heart rate dropping in 30.  The patient received a total of 2 mg of IV atropine, and her heart rate  remained in high 40s to low 50s during the procedure.   INTERVENTIONAL PROCEDURE:  Transvenous temporary pacer was inserted via  right femoral venous approach up to RV apex prior to PCI without  difficulty which was discontinued at the end of the procedure.   FINDINGS:  LV showed good LV systolic function.  There was 3+ catheter-  induced MR.  EF of 55-60%.  Left main was patent proximally and had 15-  20% distal stenosis.  LAD has 15-20% proximal stenosis and 30% proximal  bifurcation stenosis with diagonal-1.  Diagonal-1 had 60% ostial  stenosis.  Ramus was very small, which was patent.  Left circumflex has  10-15% ostial stenosis and it tapers down in the AV groove after giving  off OM-2.  OM-1 is very, very small.  OM-2 was moderate size, which was  patent.  RCA has 10-15% proximal stenosis and 70-75% sequential mid  stenosis and 70-75% focal distal stenosis with TIMI grade 3 distal flow.   INTERVENTIONAL PROCEDURE:  Successful PTCA to distal RCA and proximal  RCA was done using 2.5 x 12-mm long [place] Voyager balloon for pre-  dilatation and then 2.5 x 15-mm long Xience V stent was deployed in  distal RCA at 11 atmospheric pressure.  Stent was post-dilated using  2.75 x 12-mm long [place] Voyager balloon going up to 18 atmospheric  pressure  and then 3.5 x 28 mm long Xience V stent was deployed at 11 atmospheric  pressure in mid RCA.  Stent was post-dilated using 3.75 x 20 mm long [place]  Voyager balloon going up to 18 and 20 atmospheric pressure.  Lesions  were dilated from 70-75% to 0% with excellent TIMI grade 3 distal flow  without evidence of dissection or distal embolization.  The patient  received weight-based Angiomax and 600 mg  of Plavix during the  procedure.  The patient did not have any episodes of marked sinus  bradycardia or pauses during the procedure.  The patient tolerated the  procedure well.  There were no complications.  The patient was  transferred to recovery room in stable condition.      Eduardo Osier. Sharyn Lull, M.D.  Electronically Signed     [facility]  D:  07/05/2009  T:  07/06/2009  Job:  [id number]   cc:   Cath Lab  Osvaldo Shipper. Spruill, M.D.

## 2011-03-21 NOTE — Discharge Summary (Signed)
NAME:  Jessica [name], Jessica [name]                          ACCOUNT NO.:  [id number]   MEDICAL RECORD NO.:  [id number]                   PATIENT TYPE:  INP   LOCATION:  3708                                 FACILITY:  [facility]   PHYSICIAN:  Osvaldo Shipper. Spruill, M.D.             DATE OF BIRTH:  November 29, 1926   DATE OF ADMISSION:  10/31/2003  DATE OF DISCHARGE:  11/03/2003                                 DISCHARGE SUMMARY   DISCHARGE DIAGNOSES:  1. Chest pain.  2. Hypertension.  3. Noninsulin-dependent diabetes mellitus.  4. Paroxysmal ventricular tachycardia.  5. Hyperlipidemia.   HISTORY:  This is a 77 year old female who presented initially to the  emergency department at [facility] with a complaint of dizziness as  well as some shortness of breath.  The patient has a history of bronchitis,  high cholesterol and noninsulin-dependent diabetes mellitus as well as  hypertension.  She was evaluated in the emergency department. She was found  to have negative cardiac markers.  After evaluation it was the opinion that  the patient should be admitted for possible new onset of angina and also for  assessment of the dizziness.  The patient underwent a carotid Doppler study  which revealed a 40-60% higher end of range internal carotid artery  stenosis.  The left shoulder no evidence of significant inner coronary  artery stenosis.  There was antegrade vertebral artery flow.  The patient  further underwent Cardiolite study.  The calculated ejection fraction was  65%.  There was no evidence of pharmacologic induced myocardial ischemia.  There was normal left ventricular wall motion study.  On November 03, 2003,  the patient had nonsustained five beat of ventricular tachycardia.  She has  serial enzymes and there were no significant enzyme changes as well.  On  November 03, 2003, it was the opinion that the patient had received maximal  benefit from this hospitalization and could be discharged home with  very  close office followup.   DISCHARGE MEDICATIONS:  1. Lipitor 20 mg.  2. Avandia 2 mg.  3. Diovan HCT 160/12.5.  4. Potassium 20 mEq q.d.   The patient is to notify the physician immediately of any changes, problems  or concerns.      Ivery Quale, P.A.                       Osvaldo Shipper. Spruill, M.D.    [place]ella [place]  D:  12/27/2003  T:  12/28/2003  Job:  [id number]

## 2011-04-09 ENCOUNTER — Other Ambulatory Visit: Payer: Self-pay | Admitting: Cardiology

## 2011-04-09 DIAGNOSIS — I259 Chronic ischemic heart disease, unspecified: Secondary | ICD-10-CM

## 2011-04-09 MED ORDER — CARVEDILOL 6.25 MG PO TABS: 6.2500 mg | ORAL_TABLET | Freq: Two times a day (BID) | ORAL | Status: DC

## 2011-04-09 NOTE — Telephone Encounter (Signed)
Pt wants refill carvedilol sent to [facility]

## 2011-04-09 NOTE — Telephone Encounter (Signed)
Refilled per patient request. 

## 2011-04-14 ENCOUNTER — Encounter: Payer: Self-pay | Admitting: Cardiology

## 2011-04-15 ENCOUNTER — Other Ambulatory Visit: Payer: Self-pay | Admitting: *Deleted

## 2011-04-15 DIAGNOSIS — E785 Hyperlipidemia, unspecified: Secondary | ICD-10-CM

## 2011-04-16 ENCOUNTER — Encounter: Payer: Self-pay | Admitting: Cardiology

## 2011-04-16 ENCOUNTER — Other Ambulatory Visit (INDEPENDENT_AMBULATORY_CARE_PROVIDER_SITE_OTHER): Payer: Medicare Other | Admitting: *Deleted

## 2011-04-16 ENCOUNTER — Ambulatory Visit (INDEPENDENT_AMBULATORY_CARE_PROVIDER_SITE_OTHER): Payer: Medicare Other | Admitting: Cardiology

## 2011-04-16 DIAGNOSIS — I10 Essential (primary) hypertension: Secondary | ICD-10-CM

## 2011-04-16 DIAGNOSIS — E119 Type 2 diabetes mellitus without complications: Secondary | ICD-10-CM

## 2011-04-16 DIAGNOSIS — I209 Angina pectoris, unspecified: Secondary | ICD-10-CM

## 2011-04-16 DIAGNOSIS — E7800 Pure hypercholesterolemia, unspecified: Secondary | ICD-10-CM

## 2011-04-16 DIAGNOSIS — E785 Hyperlipidemia, unspecified: Secondary | ICD-10-CM

## 2011-04-16 LAB — LIPID PANEL
HDL: 50.7 mg/dL (ref >39.00)
LDL Cholesterol: 76 mg/dL (ref 0–99)
Total CHOL/HDL Ratio: 3
Triglycerides: 69 mg/dL (ref 0.0–149.0)
VLDL: 13.8 mg/dL (ref 0.0–40.0)

## 2011-04-16 LAB — HEPATIC FUNCTION PANEL
Alkaline Phosphatase: 68 U/L (ref 39–117)
Bilirubin, Direct: 0.1 mg/dL (ref 0.0–0.3)
Total Bilirubin: 0.7 mg/dL (ref 0.3–1.2)
Total Protein: 6.9 g/dL (ref 6.0–8.3)

## 2011-04-16 LAB — BASIC METABOLIC PANEL
Calcium: 9.4 mg/dL (ref 8.4–10.5)
Creatinine, Ser: 1 mg/dL (ref 0.4–1.2)
GFR: 59.85 mL/min — ABNORMAL LOW (ref >60.00)
Sodium: 142 mEq/L (ref 135–145)

## 2011-04-16 MED ORDER — NITROGLYCERIN 0.4 MG SL SUBL: 0.4000 mg | SUBLINGUAL_TABLET | SUBLINGUAL | Status: DC | PRN

## 2011-04-16 NOTE — Progress Notes (Signed)
Lenox Ponds Hora Date of Birth:  1927-09-10 [facility] / [facility] [address]   [address] [place], [place]  [address] [phone]           Fax   [phone]  History of Present Illness: This pleasant elderly woman is seen for a scheduled 4 month followup office visit.  She has a history of sick sinus syndrome.  She presented in September 2011 with bradycardia and syncope and underwent permanent transvenous pacemaker implant.  Since then she has done well and has had no further syncope.  She denies any chest pain.  She denies dizziness.  She has a history of ischemic heart disease and had a history of cardiac catheterization and 2 drug-eluting stents placed in the mid and distal right coronary arteries in September 2010.  She's had a past history of diabetes and hypertension.  Been diabetic since 2004 and has diabetic neuropathy of her feet.  She said high cholesterol and has been on Crestor.  She had an echocardiogram 07/16/10 which showed ejection fraction 60-65% with no wall motion abnormalities and She had a grade 2 diastolic dysfunction.  Current Outpatient Prescriptions  Medication Sig Dispense Refill  . aspirin 81 MG tablet Take 81 mg by mouth daily.        . Calcium Carbonate-Vitamin D (CALCIUM + D PO) Take 600 mg by mouth daily.        . carvedilol (COREG) 6.25 MG tablet Take 1 tablet (6.25 mg total) by mouth 2 (two) times daily.  60 tablet  11  . clopidogrel (PLAVIX) 75 MG tablet Take 75 mg by mouth daily.        . CRESTOR 10 MG tablet TAKE ONE TABLET BY MOUTH EVERY DAY  90 each  3  . Cyanocobalamin (VITAMIN B12 PO) Take by mouth daily.        . famotidine (PEPCID) 20 MG tablet Take 20 mg by mouth 2 (two) times daily.        . fish oil-omega-3 fatty acids 1000 MG capsule Take by mouth daily.        Marland Kitchen lisinopril (PRINIVIL,ZESTRIL) 10 MG tablet Take 10 mg by mouth daily.        Marland Kitchen LORazepam (ATIVAN) 1 MG tablet Take 1 mg by mouth every 8 (eight) hours as needed.          Marland Kitchen NIFEdipine (PROCARDIA XL/ADALAT-CC) 30 MG 24 hr tablet Take 30 mg by mouth daily.        . nitroGLYCERIN (NITROSTAT) 0.4 MG SL tablet Place 1 tablet (0.4 mg total) under the tongue every 5 (five) minutes as needed.  90 tablet  3  . DISCONTD: nitroGLYCERIN (NITROSTAT) 0.4 MG SL tablet Place 0.4 mg under the tongue every 5 (five) minutes as needed.        . lubiprostone (AMITIZA) 24 MCG capsule Take 24 mcg by mouth as needed.        Marland Kitchen DISCONTD: metFORMIN (GLUMETZA) 500 MG (MOD) 24 hr tablet Take 500 mg by mouth daily with breakfast.          Allergies  Allergen Reactions  . Sulfonamide Derivatives     Patient Active Problem List  Diagnoses  . DM  . HYPERCHOLESTEROLEMIA  . HYPERTENSION  . CAD  . SICK SINUS SYNDROME  . DEGENERATIVE JOINT DISEASE    History  Smoking status  . Never Smoker   Smokeless tobacco  . Not on file    History  Alcohol Use No  Family History  Problem Relation Age of Onset  . Cancer Mother   . Cancer Father     Review of Systems: Constitutional: no fever chills diaphoresis or fatigue or change in weight.  Head and neck: no hearing loss, no epistaxis, no photophobia or visual disturbance. Respiratory: No cough, shortness of breath or wheezing. Cardiovascular: No chest pain peripheral edema, palpitations. Gastrointestinal: No abdominal distention, no abdominal pain, no change in bowel habits hematochezia or melena. Genitourinary: No dysuria, no frequency, no urgency, no nocturia. Musculoskeletal:No arthralgias, no back pain, no gait disturbance or myalgias. Neurological: No dizziness, no headaches, no numbness, no seizures, no syncope, no weakness, no tremors. Hematologic: No lymphadenopathy, no easy bruising. Psychiatric: No confusion, no hallucinations, no sleep disturbance.     Physical Exam: Filed Vitals:   04/16/11 0952  BP: 120/70  Pulse: 75  The general appearance reveals a well-developed well-nourished elderly woman in no  distress.The head and neck exam reveals pupils equal and reactive.  Extraocular movements are full.  There is no scleral icterus.  The mouth and pharynx are normal.  The neck is supple.  The carotids reveal no bruits.  The jugular venous pressure is normal.  The  thyroid is not enlarged.  There is no lymphadenopathy.  The chest is clear to percussion and auscultation.  There are no rales or rhonchi.  Expansion of the chest is symmetrical.  The precordium is quiet.  The first heart sound is normal.  The second heart sound is physiologically split.  There is no murmur gallop rub or click.  There is no abnormal lift or heave.  The abdomen is soft and nontender.  The bowel sounds are normal.  The liver and spleen are not enlarged.  There are no abdominal masses.  There are no abdominal bruits.  Extremities reveal good pedal pulses.  There is no phlebitis or edema.  There is no cyanosis or clubbing.  Strength is normal and symmetrical in all extremities.  There is no lateralizing weakness.  There are no sensory deficits.  The skin is warm and dry.  There is no rash.   Assessment / Plan: Continue same medication.  Recheck in 4 months for office visit and lab work including A1c

## 2011-04-16 NOTE — Assessment & Plan Note (Signed)
The patient has a history of hypercholesterolemia.  She is on Crestor.  She's tolerating it well and is not having side effects from the statin therapy

## 2011-04-16 NOTE — Assessment & Plan Note (Signed)
The patient has a past history of diabetes.  She is on metformin 500 mg daily.  She's not having any hypoglycemic reactions.

## 2011-04-16 NOTE — Assessment & Plan Note (Signed)
This pleasant elderly woman is seen for a scheduled 4 month followup office visit.  She has a past history of essential hypertension.  Since last visit she has been feeling well.  She denies any chest pain or shortness of breath.  She's had no dizziness or syncope.  Tolerating her medication without side effects.  She has a history of sick sinus syndrome and has a functioning pacemaker

## 2011-04-17 ENCOUNTER — Telehealth: Payer: Self-pay | Admitting: Cardiology

## 2011-04-17 NOTE — Telephone Encounter (Signed)
Advised patient of labs 

## 2011-04-17 NOTE — Telephone Encounter (Signed)
Returned your phone call about her blood work. Please call back.

## 2011-06-09 ENCOUNTER — Encounter: Payer: Self-pay | Admitting: Internal Medicine

## 2011-06-30 ENCOUNTER — Other Ambulatory Visit: Payer: Self-pay | Admitting: Cardiology

## 2011-06-30 NOTE — Telephone Encounter (Signed)
escribe request  

## 2011-07-02 ENCOUNTER — Telehealth: Payer: Self-pay | Admitting: Cardiology

## 2011-07-02 DIAGNOSIS — I2510 Atherosclerotic heart disease of native coronary artery without angina pectoris: Secondary | ICD-10-CM

## 2011-07-02 MED ORDER — CLOPIDOGREL BISULFATE 75 MG PO TABS: 75.0000 mg | ORAL_TABLET | Freq: Every day | ORAL | Status: DC

## 2011-07-02 NOTE — Telephone Encounter (Signed)
Pt wants refill of plavix sent to [facility] [address] [phone]

## 2011-07-02 NOTE — Telephone Encounter (Signed)
Advised patient done.  °

## 2011-07-09 ENCOUNTER — Encounter: Payer: Self-pay | Admitting: Internal Medicine

## 2011-07-09 ENCOUNTER — Ambulatory Visit (INDEPENDENT_AMBULATORY_CARE_PROVIDER_SITE_OTHER): Payer: Medicare Other | Admitting: Internal Medicine

## 2011-07-09 DIAGNOSIS — I10 Essential (primary) hypertension: Secondary | ICD-10-CM

## 2011-07-09 DIAGNOSIS — I495 Sick sinus syndrome: Secondary | ICD-10-CM

## 2011-07-09 LAB — PACEMAKER DEVICE OBSERVATION
AL IMPEDENCE PM: 689 Ohm
AL THRESHOLD: 0.25 V
BATTERY VOLTAGE: 2.79 V
RV LEAD IMPEDENCE PM: 743 Ohm

## 2011-07-09 NOTE — Patient Instructions (Signed)
Remote monitoring is used to monitor your Pacemaker of ICD from home. This monitoring reduces the number of office visits required to check your device to one time per year. It allows [place] to keep an eye on the functioning of your device to ensure it is working properly. You are scheduled for a device check from home on 10/09/11. You may send your transmission at any time that day. If you have a wireless device, the transmission will be sent automatically. After your physician reviews your transmission, you will receive a postcard with your next transmission date.  Your physician wants you to follow-up in: 1 year with Dr. Johney Frame. You will receive a reminder letter in the mail two months in advance. If you don't receive a letter, please call our office to schedule the follow-up appointment.  Your physician recommends that you continue on your current medications as directed. Please refer to the Current Medication list given to you today.

## 2011-07-09 NOTE — Assessment & Plan Note (Signed)
Normal pacemaker function See Pace Art report No changes today  

## 2011-07-09 NOTE — Progress Notes (Signed)
The patient presents today for routine electrophysiology followup.  Since last being seen in our clinic, the patient reports doing very well.  Today, she denies symptoms of palpitations, chest pain, shortness of breath, orthopnea, PND, lower extremity edema, dizziness, presyncope, syncope, or neurologic sequela.  The patient feels that she is tolerating medications without difficulties and is otherwise without complaint today.   Past Medical History  Diagnosis Date  . Hypertension   . Diabetes mellitus     NON INSULIN DEPENDENT  . Hypercholesterolemia   . DJD (degenerative joint disease)   . History of diabetic neuropathy   . Chest pain   . DOE (dyspnea on exertion)   . IHD (ischemic heart disease)   . SSS (sick sinus syndrome)     s/p PPM by JA for SSS and syncope 9/11  . Breast cancer   . GERD (gastroesophageal reflux disease)   . Hemorrhoids   . CAD (coronary artery disease)    Past Surgical History  Procedure Date  . Cardiac catheterization 07/05/2009    EF 55-60%  . Insert / replace / remove pacemaker 9/11    SSS and syncope, implanted by JA (MDT)  . Mastectomy 1992    BILATERAL WITH RECONSTRUCTION  . Colonoscopy     Current Outpatient Prescriptions  Medication Sig Dispense Refill  . aspirin 81 MG tablet Take 81 mg by mouth daily.        . Calcium Carbonate-Vitamin D (CALCIUM + D PO) Take 600 mg by mouth daily.        . carvedilol (COREG) 6.25 MG tablet Take 1 tablet (6.25 mg total) by mouth 2 (two) times daily.  60 tablet  11  . clopidogrel (PLAVIX) 75 MG tablet Take 1 tablet (75 mg total) by mouth daily.  30 each  11  . CRESTOR 10 MG tablet TAKE ONE TABLET BY MOUTH EVERY DAY  90 each  3  . Cyanocobalamin (VITAMIN B12 PO) Take by mouth daily.        . famotidine (PEPCID) 20 MG tablet Take 20 mg by mouth 2 (two) times daily.        . fish oil-omega-3 fatty acids 1000 MG capsule Take by mouth daily.        Marland Kitchen lisinopril (PRINIVIL,ZESTRIL) 10 MG tablet Take 10 mg by mouth  daily.        Marland Kitchen LORazepam (ATIVAN) 1 MG tablet Take 1 mg by mouth every 8 (eight) hours as needed.        . lubiprostone (AMITIZA) 24 MCG capsule Take 24 mcg by mouth daily.       . metFORMIN (GLUCOPHAGE-XR) 500 MG 24 hr tablet Take 500 mg by mouth daily.        Marland Kitchen NIFEdipine (PROCARDIA XL/ADALAT-CC) 30 MG 24 hr tablet Take 30 mg by mouth daily.        . nitroGLYCERIN (NITROSTAT) 0.4 MG SL tablet Place 1 tablet (0.4 mg total) under the tongue every 5 (five) minutes as needed.  90 tablet  3    Allergies  Allergen Reactions  . Sulfonamide Derivatives     History   Social History  . Marital Status: Widowed    Spouse Name: N/A    Number of Children: N/A  . Years of Education: N/A   Occupational History  . Not on file.   Social History Main Topics  . Smoking status: Never Smoker   . Smokeless tobacco: Not on file  . Alcohol Use: No  . Drug Use:  No  . Sexually Active:    Other Topics Concern  . Not on file   Social History Narrative  . No narrative on file    Family History  Problem Relation Age of Onset  . Cancer Mother   . Cancer Father     Physical Exam: Filed Vitals:   07/09/11 1018  BP: 152/72  Pulse: 78  Height: 5\' 6"  (1.676 m)  Weight: 159 lb 12 oz (72.462 kg)    GEN- The patient is well appearing, alert and oriented x 3 today.   Head- normocephalic, atraumatic Eyes-  Sclera clear, conjunctiva pink Ears- hearing intact Oropharynx- clear Neck- supple, no JVP Lymph- no cervical lymphadenopathy Lungs- Clear to ausculation bilaterally, normal work of breathing Chest- pacemaker pocket is well healed Heart- Regular rate and rhythm, no murmurs, rubs or gallops, PMI not laterally displaced GI- soft, NT, ND, + BS Extremities- no clubbing, cyanosis, or edema MS- no significant deformity or atrophy Skin- no rash or lesion Psych- euthymic mood, full affect Neuro- strength and sensation are intact  Pacemaker interrogation- reviewed in detail today,  See  PACEART report  Assessment and Plan:

## 2011-07-09 NOTE — Assessment & Plan Note (Signed)
Elevated today, though she reports good BP control at home  No changes today

## 2011-08-12 ENCOUNTER — Telehealth: Payer: Self-pay | Admitting: Cardiology

## 2011-08-12 DIAGNOSIS — E119 Type 2 diabetes mellitus without complications: Secondary | ICD-10-CM

## 2011-08-12 NOTE — Telephone Encounter (Signed)
Metformin 500mg  refill needed, uses [facility] [address]

## 2011-08-14 MED ORDER — METFORMIN HCL 500 MG PO TABS: 500.0000 mg | ORAL_TABLET | Freq: Every day | ORAL | Status: DC

## 2011-08-14 NOTE — Telephone Encounter (Signed)
Done as requested.

## 2011-09-06 ENCOUNTER — Other Ambulatory Visit: Payer: Self-pay | Admitting: Cardiology

## 2011-09-08 NOTE — Telephone Encounter (Signed)
Refilled nifedipine

## 2011-09-10 ENCOUNTER — Other Ambulatory Visit (INDEPENDENT_AMBULATORY_CARE_PROVIDER_SITE_OTHER): Payer: Medicare Other | Admitting: *Deleted

## 2011-09-10 DIAGNOSIS — E7800 Pure hypercholesterolemia, unspecified: Secondary | ICD-10-CM

## 2011-09-10 DIAGNOSIS — E119 Type 2 diabetes mellitus without complications: Secondary | ICD-10-CM

## 2011-09-10 LAB — BASIC METABOLIC PANEL
BUN: 21 mg/dL (ref 6–23)
Calcium: 9.3 mg/dL (ref 8.4–10.5)
GFR: 64.46 mL/min (ref >60.00)
Glucose, Bld: 114 mg/dL — ABNORMAL HIGH (ref 70–99)

## 2011-09-10 LAB — HEPATIC FUNCTION PANEL
ALT: 15 U/L (ref 0–35)
AST: 15 U/L (ref 0–37)
Bilirubin, Direct: 0 mg/dL (ref 0.0–0.3)
Total Bilirubin: 0.8 mg/dL (ref 0.3–1.2)

## 2011-09-10 LAB — LIPID PANEL: HDL: 52.7 mg/dL (ref >39.00)

## 2011-09-17 ENCOUNTER — Ambulatory Visit (INDEPENDENT_AMBULATORY_CARE_PROVIDER_SITE_OTHER): Payer: Medicare Other | Admitting: Cardiology

## 2011-09-17 ENCOUNTER — Encounter: Payer: Self-pay | Admitting: Cardiology

## 2011-09-17 DIAGNOSIS — I10 Essential (primary) hypertension: Secondary | ICD-10-CM

## 2011-09-17 DIAGNOSIS — I2510 Atherosclerotic heart disease of native coronary artery without angina pectoris: Secondary | ICD-10-CM

## 2011-09-17 DIAGNOSIS — E119 Type 2 diabetes mellitus without complications: Secondary | ICD-10-CM

## 2011-09-17 DIAGNOSIS — E7800 Pure hypercholesterolemia, unspecified: Secondary | ICD-10-CM

## 2011-09-17 DIAGNOSIS — I119 Hypertensive heart disease without heart failure: Secondary | ICD-10-CM

## 2011-09-17 DIAGNOSIS — I209 Angina pectoris, unspecified: Secondary | ICD-10-CM

## 2011-09-17 NOTE — Patient Instructions (Signed)
Your physician recommends that you continue on your current medications as directed. Please refer to the Current Medication list given to you today. Your physician recommends that you schedule a follow-up appointment in: 4 months with fasting labs (LP/BMET/HFP/A1C)

## 2011-09-17 NOTE — Assessment & Plan Note (Signed)
The patient has a history of elevated cholesterol and is on Crestor.  She's not had any side effects from the statin therapy.

## 2011-09-17 NOTE — Progress Notes (Signed)
Lenox Ponds Seher Date of Birth:  1927/03/30 [facility] / [facility] [address]   [address] [place], [place]  [address] [phone]           Fax   [phone]  History of Present Illness: This pleasant elderly woman is seen for a scheduled four-month followup office visit.  She has a history of sick sinus syndrome and has a permanent pacemaker implant implanted September 2011.  She presented with syncope.  She's had no further syncope since the pacemaker.  She denies any chest pain.  She has a history of ischemic heart disease and has had 2 drug-eluting stents placed in the mid and distal right coronary arteries in September 2010.  She has a history of diabetes and history of high blood pressure and high cholesterol.  Her echocardiogram in September 2011 showed an ejection fraction of 60-65% with no wall motion abnormalities and she does have diastolic dysfunction.  Current Outpatient Prescriptions  Medication Sig Dispense Refill  . aspirin 81 MG tablet Take 81 mg by mouth daily.        . Calcium Carbonate-Vitamin D (CALCIUM + D PO) Take 600 mg by mouth daily.        . carvedilol (COREG) 6.25 MG tablet Take 1 tablet (6.25 mg total) by mouth 2 (two) times daily.  60 tablet  11  . clopidogrel (PLAVIX) 75 MG tablet Take 1 tablet (75 mg total) by mouth daily.  30 each  11  . CRESTOR 10 MG tablet TAKE ONE TABLET BY MOUTH EVERY DAY  90 each  3  . Cyanocobalamin (VITAMIN B12 PO) Take by mouth daily.        . famotidine (PEPCID) 20 MG tablet Take 20 mg by mouth 2 (two) times daily.        . fish oil-omega-3 fatty acids 1000 MG capsule Take by mouth daily.        Marland Kitchen lisinopril (PRINIVIL,ZESTRIL) 10 MG tablet Take 10 mg by mouth daily.        Marland Kitchen LORazepam (ATIVAN) 1 MG tablet Take 1 mg by mouth every 8 (eight) hours as needed.        . metFORMIN (GLUCOPHAGE) 500 MG tablet Take 1 tablet (500 mg total) by mouth daily with breakfast.  90 tablet  1  . NIFEDIAC CC 30 MG 24 hr tablet TAKE ONE   BY MOUTH EVERY DAY  90 each  3  . nitroGLYCERIN (NITROSTAT) 0.4 MG SL tablet Place 1 tablet (0.4 mg total) under the tongue every 5 (five) minutes as needed.  90 tablet  3    Allergies  Allergen Reactions  . Sulfonamide Derivatives     Patient Active Problem List  Diagnoses  . DM  . HYPERCHOLESTEROLEMIA  . HYPERTENSION  . CAD  . SICK SINUS SYNDROME  . DEGENERATIVE JOINT DISEASE    History  Smoking status  . Never Smoker   Smokeless tobacco  . Not on file    History  Alcohol Use No    Family History  Problem Relation Age of Onset  . Cancer Mother   . Cancer Father     Review of Systems: Constitutional: no fever chills diaphoresis or fatigue or change in weight.  Head and neck: no hearing loss, no epistaxis, no photophobia or visual disturbance. Respiratory: No cough, shortness of breath or wheezing. Cardiovascular: No chest pain peripheral edema, palpitations. Gastrointestinal: No abdominal distention, no abdominal pain, no change in bowel habits hematochezia or melena. Genitourinary:  No dysuria, no frequency, no urgency, no nocturia. Musculoskeletal:No arthralgias, no back pain, no gait disturbance or myalgias. Neurological: No dizziness, no headaches, no numbness, no seizures, no syncope, no weakness, no tremors. Hematologic: No lymphadenopathy, no easy bruising. Psychiatric: No confusion, no hallucinations, no sleep disturbance.    Physical Exam: Filed Vitals:   09/17/11 1605  BP: 130/88  Pulse: 80  The patient appears to be in no distress.  Head and neck exam reveals that the pupils are equal and reactive.  The extraocular movements are full.  There is no scleral icterus.  Mouth and pharynx are benign.  No lymphadenopathy.  No carotid bruits.  The jugular venous pressure is normal.  Thyroid is not enlarged or tender.  Chest is clear to percussion and auscultation.  No rales or rhonchi.  Expansion of the chest is symmetrical.  Heart reveals no abnormal  lift or heave.  First and second heart sounds are normal.  There is nor gallop rub or click.  There is a soft systolic ejection murmur at the base.  The abdomen is soft and nontender.  Bowel sounds are normoactive.  There is no hepatosplenomegaly or mass.  There are no abdominal bruits.  Extremities reveal no phlebitis or edema.  Pedal pulses are good.  There is no cyanosis or clubbing.  Neurologic exam is normal strength and no lateralizing weakness.  No sensory deficits.  Integument reveals no rash    Assessment / Plan: Continue same medication.  Recheck in 4 months for followup office visit in fasting lab work including hemoglobin A1c

## 2011-09-17 NOTE — Assessment & Plan Note (Signed)
There was some confusion about the patient's blood pressure medication.  She had both nifedipine and Nifediac on her list.  We clarified that.  She is no longer taking nifedipine and she is taking nifediac.  She has not been expressing any dizziness.  No chest pain or shortness of breath.

## 2011-09-17 NOTE — Assessment & Plan Note (Signed)
The patient does have a history of prior stents and is on aspirin.  She has had no recurrent angina pectoris.

## 2011-10-09 ENCOUNTER — Encounter: Payer: Medicare Other | Admitting: *Deleted

## 2011-10-16 ENCOUNTER — Encounter: Payer: Self-pay | Admitting: *Deleted

## 2011-12-15 ENCOUNTER — Other Ambulatory Visit: Payer: Self-pay | Admitting: Cardiology

## 2011-12-15 NOTE — Telephone Encounter (Signed)
Refilled lisinopril and pepcid

## 2012-01-28 ENCOUNTER — Other Ambulatory Visit: Payer: Medicare Other

## 2012-01-28 ENCOUNTER — Encounter: Payer: Self-pay | Admitting: Cardiology

## 2012-01-28 ENCOUNTER — Ambulatory Visit (INDEPENDENT_AMBULATORY_CARE_PROVIDER_SITE_OTHER): Payer: Medicare Other | Admitting: Cardiology

## 2012-01-28 VITALS — BP 130/98 | Ht 64.0 in | Wt 156.0 lb

## 2012-01-28 DIAGNOSIS — Z950 Presence of cardiac pacemaker: Secondary | ICD-10-CM

## 2012-01-28 DIAGNOSIS — E119 Type 2 diabetes mellitus without complications: Secondary | ICD-10-CM

## 2012-01-28 DIAGNOSIS — I2510 Atherosclerotic heart disease of native coronary artery without angina pectoris: Secondary | ICD-10-CM | POA: Diagnosis not present

## 2012-01-28 DIAGNOSIS — I110 Hypertensive heart disease with heart failure: Secondary | ICD-10-CM | POA: Insufficient documentation

## 2012-01-28 DIAGNOSIS — I119 Hypertensive heart disease without heart failure: Secondary | ICD-10-CM

## 2012-01-28 DIAGNOSIS — E7800 Pure hypercholesterolemia, unspecified: Secondary | ICD-10-CM

## 2012-01-28 DIAGNOSIS — I495 Sick sinus syndrome: Secondary | ICD-10-CM

## 2012-01-28 LAB — HEPATIC FUNCTION PANEL
AST: 17 U/L (ref 0–37)
Albumin: 4.1 g/dL (ref 3.5–5.2)
Alkaline Phosphatase: 54 U/L (ref 39–117)
Bilirubin, Direct: 0 mg/dL (ref 0.0–0.3)
Total Protein: 7.2 g/dL (ref 6.0–8.3)

## 2012-01-28 LAB — BASIC METABOLIC PANEL
BUN: 25 mg/dL — ABNORMAL HIGH (ref 6–23)
CO2: 27 mEq/L (ref 19–32)
Calcium: 9.8 mg/dL (ref 8.4–10.5)
Creatinine, Ser: 1.1 mg/dL (ref 0.4–1.2)

## 2012-01-28 LAB — CBC WITH DIFFERENTIAL/PLATELET
Basophils Absolute: 0.1 10*3/uL (ref 0.0–0.1)
Eosinophils Absolute: 0.1 10*3/uL (ref 0.0–0.7)
Eosinophils Relative: 1.1 % (ref 0.0–5.0)
HCT: 41.7 % (ref 36.0–46.0)
Lymphs Abs: 2.6 10*3/uL (ref 0.7–4.0)
MCV: 89.4 fl (ref 78.0–100.0)
Monocytes Absolute: 0.5 10*3/uL (ref 0.1–1.0)
Neutrophils Relative %: 66.7 % (ref 43.0–77.0)
Platelets: 278 10*3/uL (ref 150.0–400.0)
RDW: 13.9 % (ref 11.5–14.6)
WBC: 10 10*3/uL (ref 4.5–10.5)

## 2012-01-28 LAB — LIPID PANEL
Cholesterol: 123 mg/dL (ref 0–200)
Triglycerides: 74 mg/dL (ref 0.0–149.0)

## 2012-01-28 MED ORDER — LISINOPRIL 10 MG PO TABS: 10.0000 mg | ORAL_TABLET | Freq: Two times a day (BID) | ORAL | Status: DC

## 2012-01-28 NOTE — Patient Instructions (Signed)
Your physician has recommended you make the following change in your medication: Stop Plavix   Increase Lisinopril to twice a day  Your physician recommends that you schedule a follow-up appointment in: 4 months with Dr. Patty Sermons. & fasting lab work that same day.

## 2012-01-28 NOTE — Assessment & Plan Note (Signed)
Her blood pressure has been running high at home and it was high here.  We will increase her lisinopril to 10 mg twice a day

## 2012-01-28 NOTE — Assessment & Plan Note (Signed)
The patient is on Crestor.  Her lipids are remaining quite satisfactory.  She's not having any side effects from the Crestor

## 2012-01-28 NOTE — Assessment & Plan Note (Signed)
The patient has no further syncope since having her pacemaker placed for sick sinus syndrome with marked bradycardia.

## 2012-01-28 NOTE — Progress Notes (Signed)
Quick Note:  Please report to patient. The recent labs are stable. Continue same medication and careful diet. A1C 6.4 stable. ______

## 2012-01-28 NOTE — Assessment & Plan Note (Signed)
The patient has known ischemic heart disease with 2 prior stents in the right coronary artery in September 2010.  He has not been having any recurrent chest pain or angina.

## 2012-01-28 NOTE — Progress Notes (Signed)
Jessica [name] Date of Birth:  [birth year] [facility] [address] [place], [place]  [address] [phone]         Fax   [phone]  History of Present Illness: This pleasant 78 year old woman is seen for a scheduled followup visit.  She has a history of sick sinus syndrome and has a permanent pacemaker.  She presented with syncope.  She has a history of ischemic heart disease and has had 2 drug-eluting stents placed in the mid and distal right coronary artery in September 2000 patient has a history of diabetes, essential hypertension, and high cholesterol.  She has not had a history of heart failure and her ejection fraction in September 2011 was 60-65% with no wall motion abnormalities although she did have some diastolic dysfunction.  Current Outpatient Prescriptions  Medication Sig Dispense Refill  . aspirin 81 MG tablet Take 81 mg by mouth daily.        . Calcium Carbonate-Vitamin D (CALCIUM + D PO) Take 600 mg by mouth daily.        . carvedilol (COREG) 6.25 MG tablet Take 1 tablet (6.25 mg total) by mouth 2 (two) times daily.  60 tablet  11  . CRESTOR 10 MG tablet TAKE ONE TABLET BY MOUTH EVERY DAY  90 each  3  . Cyanocobalamin (VITAMIN B12 PO) Take by mouth daily.        . famotidine (PEPCID) 20 MG tablet TAKE ONE TABLET BY MOUTH TWICE DAILY  180 tablet  3  . fish oil-omega-3 fatty acids 1000 MG capsule Take by mouth daily.        Marland Kitchen lisinopril (PRINIVIL,ZESTRIL) 10 MG tablet Take 1 tablet (10 mg total) by mouth 2 (two) times daily.  180 tablet  3  . LORazepam (ATIVAN) 1 MG tablet Take 1 mg by mouth every 8 (eight) hours as needed.        . metFORMIN (GLUCOPHAGE) 500 MG tablet Take 1 tablet (500 mg total) by mouth daily with breakfast.  90 tablet  1  . nitroGLYCERIN (NITROSTAT) 0.4 MG SL tablet Place 1 tablet (0.4 mg total) under the tongue every 5 (five) minutes as needed.  90 tablet  3    Allergies  Allergen Reactions  . Sulfonamide Derivatives      Patient Active Problem List  Diagnoses  . DM  . HYPERCHOLESTEROLEMIA  . HYPERTENSION  . CAD  . SICK SINUS SYNDROME  . DEGENERATIVE JOINT DISEASE    History  Smoking status  . Never Smoker   Smokeless tobacco  . Not on file    History  Alcohol Use No    Family History  Problem Relation Age of Onset  . Cancer Mother   . Cancer Father     Review of Systems: Constitutional: no fever chills diaphoresis or fatigue or change in weight.  Head and neck: no hearing loss, no epistaxis, no photophobia or visual disturbance. Respiratory: No cough, shortness of breath or wheezing. Cardiovascular: No chest pain peripheral edema, palpitations. Gastrointestinal: No abdominal distention, no abdominal pain, no change in bowel habits hematochezia or melena. Genitourinary: No dysuria, no frequency, no urgency, no nocturia. Musculoskeletal:No arthralgias, no back pain, no gait disturbance or myalgias. Neurological: No dizziness, no headaches, no numbness, no seizures, no syncope, no weakness, no tremors. Hematologic: No lymphadenopathy, no easy bruising. Psychiatric: No confusion, no hallucinations, no sleep disturbance.    Physical Exam: Filed Vitals:   01/28/12 0939  BP: 130/98   the general  appearance reveals a well-developed well-nourished woman in no distress.Pupils equal and reactive.   Extraocular Movements are full.  There is no scleral icterus.  The mouth and pharynx are normal.  The neck is supple.  The carotids reveal no bruits.  The jugular venous pressure is normal.  The thyroid is not enlarged.  There is no lymphadenopathy.  The chest is clear to percussion and auscultation. There are no rales or rhonchi. Expansion of the chest is symmetrical.  The precordium is quiet.  The first heart sound is normal.  The second heart sound is physiologically split.  There is no murmur gallop rub or click.  There is no abnormal lift or heave.  The abdomen is soft and nontender.  Bowel sounds are normal. The liver and spleen are not enlarged. There Are no abdominal masses. There are no bruits.  The pedal pulses are good.  There is no phlebitis or edema.  There is no cyanosis or clubbing. Integument reveals evidence of PE bruising.   Assessment / Plan: Because of her easy bruising we will stop her Plavix now.  If she begins to have problems with angina we will need to restart it.  She will continue to take the baby 81 mg aspirin. For her blood pressure we are increasing lisinopril to twice a day. Recheck in 4 months for followup office visit CBC A1c lipid panel hepatic function panel basal metabolic panel

## 2012-01-29 ENCOUNTER — Telehealth: Payer: Self-pay | Admitting: *Deleted

## 2012-01-29 NOTE — Telephone Encounter (Signed)
Mailed copy of labs and left message to call if any questions  

## 2012-01-29 NOTE — Telephone Encounter (Signed)
Message copied by Burnell Blanks on Thu Jan 29, 2012  4:54 PM ------      Message from: [name]      Created: Wed Jan 28, 2012  9:42 PM       Please report to patient.  The recent labs are stable. Continue same medication and careful diet. A1C 6.4 stable.

## 2012-02-09 ENCOUNTER — Other Ambulatory Visit: Payer: Self-pay | Admitting: Cardiology

## 2012-03-08 ENCOUNTER — Other Ambulatory Visit: Payer: Self-pay | Admitting: Cardiology

## 2012-03-08 NOTE — Telephone Encounter (Signed)
Refilled crestor 

## 2012-03-24 DIAGNOSIS — M2040 Other hammer toe(s) (acquired), unspecified foot: Secondary | ICD-10-CM | POA: Diagnosis not present

## 2012-03-24 DIAGNOSIS — B351 Tinea unguium: Secondary | ICD-10-CM | POA: Diagnosis not present

## 2012-03-24 DIAGNOSIS — M79609 Pain in unspecified limb: Secondary | ICD-10-CM | POA: Diagnosis not present

## 2012-03-29 ENCOUNTER — Encounter: Payer: Self-pay | Admitting: *Deleted

## 2012-04-28 ENCOUNTER — Ambulatory Visit (INDEPENDENT_AMBULATORY_CARE_PROVIDER_SITE_OTHER): Payer: Medicare Other | Admitting: *Deleted

## 2012-04-28 ENCOUNTER — Encounter: Payer: Self-pay | Admitting: Internal Medicine

## 2012-04-28 DIAGNOSIS — I495 Sick sinus syndrome: Secondary | ICD-10-CM

## 2012-04-28 LAB — PACEMAKER DEVICE OBSERVATION
AL THRESHOLD: 0.625 V
BAMS-0001: 150 {beats}/min
BATTERY VOLTAGE: 2.79 V
VENTRICULAR PACING PM: 32

## 2012-04-28 NOTE — Progress Notes (Signed)
PPM check 

## 2012-05-11 ENCOUNTER — Other Ambulatory Visit: Payer: Self-pay | Admitting: Cardiology

## 2012-06-09 ENCOUNTER — Encounter: Payer: Self-pay | Admitting: Cardiology

## 2012-06-09 ENCOUNTER — Ambulatory Visit (INDEPENDENT_AMBULATORY_CARE_PROVIDER_SITE_OTHER): Payer: Medicare Other | Admitting: Cardiology

## 2012-06-09 VITALS — BP 130/90 | HR 80 | Ht 64.0 in | Wt 154.0 lb

## 2012-06-09 DIAGNOSIS — I2510 Atherosclerotic heart disease of native coronary artery without angina pectoris: Secondary | ICD-10-CM

## 2012-06-09 DIAGNOSIS — E119 Type 2 diabetes mellitus without complications: Secondary | ICD-10-CM

## 2012-06-09 DIAGNOSIS — I119 Hypertensive heart disease without heart failure: Secondary | ICD-10-CM

## 2012-06-09 DIAGNOSIS — E7800 Pure hypercholesterolemia, unspecified: Secondary | ICD-10-CM

## 2012-06-09 DIAGNOSIS — IMO0001 Reserved for inherently not codable concepts without codable children: Secondary | ICD-10-CM

## 2012-06-09 LAB — HEPATIC FUNCTION PANEL
ALT: 16 U/L (ref 0–35)
AST: 18 U/L (ref 0–37)
Bilirubin, Direct: 0.1 mg/dL (ref 0.0–0.3)
Total Bilirubin: 0.8 mg/dL (ref 0.3–1.2)
Total Protein: 6.7 g/dL (ref 6.0–8.3)

## 2012-06-09 LAB — BASIC METABOLIC PANEL
CO2: 28 mEq/L (ref 19–32)
Calcium: 9.5 mg/dL (ref 8.4–10.5)
Creatinine, Ser: 0.9 mg/dL (ref 0.4–1.2)
GFR: 63.52 mL/min (ref >60.00)
Glucose, Bld: 108 mg/dL — ABNORMAL HIGH (ref 70–99)
Sodium: 143 mEq/L (ref 135–145)

## 2012-06-09 LAB — LIPID PANEL
Cholesterol: 117 mg/dL (ref 0–200)
HDL: 52.5 mg/dL (ref >39.00)
VLDL: 12.4 mg/dL (ref 0.0–40.0)

## 2012-06-09 NOTE — Progress Notes (Signed)
Quick Note:  Please report to patient. The recent labs are stable. Continue same medication and careful diet. A1C is down to 6.0 excellent. ______

## 2012-06-09 NOTE — Assessment & Plan Note (Signed)
The patient has a history of hypercholesterolemia.  She is on Crestor.  She's not having myalgias.  We are checking lab work today.

## 2012-06-09 NOTE — Assessment & Plan Note (Signed)
The patient has a history of diabetes mellitus.  She does not see a diabetologist.  She went to some classes concerning diabetes and she watches what she eats.  This morning at home her blood sugar was 88.  She is not having any hypoglycemic episodes and her weight is down 2 pounds

## 2012-06-09 NOTE — Assessment & Plan Note (Signed)
Patient denies any chest pain or angina pectoris.

## 2012-06-09 NOTE — Patient Instructions (Addendum)
Will obtain labs today and call you with the results  Your physician recommends that you continue on your current medications as directed. Please refer to the Current Medication list given to you today.  Your physician recommends that you schedule a follow-up appointment in: 4 months with fasting labs (lp/bmet/hfp/a1c)

## 2012-06-09 NOTE — Progress Notes (Signed)
Jessica [name] Date of Birth:  20-Jul-1927 [facility] [address] [place], [place]  [address] [phone]         Fax   [phone]  History of Present Illness: This pleasant 78 year old woman is seen for a four-month followup office visit.  She has a complex past medical history the she has a history of sick sinus syndrome and has a permanent pacemaker.  She has had no further episodes of syncope since the pacemaker implantation.  She has a history of ischemic heart disease in his head to drug-eluting stents placed in the mid and distal right coronary artery in September 2000.  She has a history of diabetes, essential hypertension, and high cholesterol.  An echocardiogram in September 2011 showed normal ejection fraction of 60-65% with diastolic dysfunction  Current Outpatient Prescriptions  Medication Sig Dispense Refill  . aspirin 81 MG tablet Take 81 mg by mouth daily.        . Calcium Carbonate-Vitamin D (CALCIUM + D PO) Take 600 mg by mouth daily.        . carvedilol (COREG) 6.25 MG tablet Take 6.25 mg by mouth 2 (two) times daily with a meal.      . carvedilol (COREG) 6.25 MG tablet TAKE ONE TABLET BY MOUTH TWICE DAILY  60 tablet  12  . CRESTOR 10 MG tablet TAKE ONE TABLET BY MOUTH EVERY DAY  90 each  3  . Cyanocobalamin (VITAMIN B12 PO) Take by mouth daily.        . famotidine (PEPCID) 20 MG tablet TAKE ONE TABLET BY MOUTH TWICE DAILY  180 tablet  3  . lisinopril (PRINIVIL,ZESTRIL) 10 MG tablet Take 1 tablet (10 mg total) by mouth 2 (two) times daily.  180 tablet  3  . LORazepam (ATIVAN) 1 MG tablet Take 1 mg by mouth every 8 (eight) hours as needed.        . metFORMIN (GLUCOPHAGE) 500 MG tablet TAKE ONE TABLET BY MOUTH EVERY DAY WITH BREAKFAST  90 tablet  1  . nitroGLYCERIN (NITROSTAT) 0.4 MG SL tablet Place 1 tablet (0.4 mg total) under the tongue every 5 (five) minutes as needed.  90 tablet  3  . pregabalin (LYRICA) 25 MG capsule Take 25 mg by mouth daily.       . fish oil-omega-3 fatty acids 1000 MG capsule Take by mouth daily.          Allergies  Allergen Reactions  . Lyrica (Pregabalin)     wgt gain  . Sulfonamide Derivatives     Patient Active Problem List  Diagnosis  . DM  . HYPERCHOLESTEROLEMIA  . CAD  . SICK SINUS SYNDROME  . DEGENERATIVE JOINT DISEASE  . Benign hypertensive heart disease without heart failure    History  Smoking status  . Never Smoker   Smokeless tobacco  . Not on file    History  Alcohol Use No    Family History  Problem Relation Age of Onset  . Cancer Mother   . Cancer Father     Review of Systems: Constitutional: no fever chills diaphoresis or fatigue or change in weight.  Head and neck: no hearing loss, no epistaxis, no photophobia or visual disturbance. Respiratory: No cough, shortness of breath or wheezing. Cardiovascular: No chest pain peripheral edema, palpitations. Gastrointestinal: No abdominal distention, no abdominal pain, no change in bowel habits hematochezia or melena. Genitourinary: No dysuria, no frequency, no urgency, no nocturia. Musculoskeletal:No arthralgias, no back pain, no  gait disturbance or myalgias. Neurological: No dizziness, no headaches, no numbness, no seizures, no syncope, no weakness, no tremors. Hematologic: No lymphadenopathy, no easy bruising. Psychiatric: No confusion, no hallucinations, no sleep disturbance.    Physical Exam: Filed Vitals:   06/09/12 1032  BP: 130/90  Pulse: 80   general appearance reveals a well-developed well-nourished woman in no distress.The head and neck exam reveals pupils equal and reactive.  Extraocular movements are full.  There is no scleral icterus.  The mouth and pharynx are normal.  The neck is supple.  The carotids reveal no bruits.  The jugular venous pressure is normal.  The  thyroid is not enlarged.  There is no lymphadenopathy.  The chest is clear to percussion and auscultation.  There are no rales or rhonchi.   Expansion of the chest is symmetrical.  The precordium is quiet.  The first heart sound is normal.  The second heart sound is physiologically split.  There is no murmur gallop rub or click.  There is no abnormal lift or heave.  The abdomen is soft and nontender.  The bowel sounds are normal.  The liver and spleen are not enlarged.  There are no abdominal masses.  There are no abdominal bruits.  Extremities reveal good pedal pulses.  There is no phlebitis or edema.  There is no cyanosis or clubbing.  Strength is normal and symmetrical in all extremities.  There is no lateralizing weakness.  There are no sensory deficits.  The skin is warm and dry.  There is no rash.     Assessment / Plan: Continue same medication.  She recently stopped taking Lyrica which her podiatrist had given her for diabetic neuropathy.  She has an appointment to see him again soon.  She has not had a trial of gabapentin yet.  Recheck here in 4 months for followup office visit lipid panel hepatic function panel nasal metabolic panel and A1c

## 2012-06-14 ENCOUNTER — Telehealth: Payer: Self-pay | Admitting: *Deleted

## 2012-06-14 NOTE — Telephone Encounter (Signed)
Mailed copy of labs and left message to call if any questions  

## 2012-06-14 NOTE — Telephone Encounter (Signed)
Message copied by Burnell Blanks on Mon Jun 14, 2012  5:25 PM ------      Message from: [name]      Created: Wed Jun 09, 2012  8:39 PM       Please report to patient.  The recent labs are stable. Continue same medication and careful diet. A1C is down to 6.0 excellent.

## 2012-07-19 ENCOUNTER — Encounter: Payer: Self-pay | Admitting: *Deleted

## 2012-07-19 DIAGNOSIS — Z950 Presence of cardiac pacemaker: Secondary | ICD-10-CM | POA: Insufficient documentation

## 2012-07-30 ENCOUNTER — Ambulatory Visit (INDEPENDENT_AMBULATORY_CARE_PROVIDER_SITE_OTHER): Payer: Medicare Other | Admitting: Internal Medicine

## 2012-07-30 ENCOUNTER — Other Ambulatory Visit: Payer: Self-pay | Admitting: Cardiology

## 2012-07-30 ENCOUNTER — Encounter: Payer: Self-pay | Admitting: Internal Medicine

## 2012-07-30 VITALS — BP 182/86 | HR 87 | Ht 63.0 in | Wt 154.0 lb

## 2012-07-30 DIAGNOSIS — I10 Essential (primary) hypertension: Secondary | ICD-10-CM | POA: Diagnosis not present

## 2012-07-30 DIAGNOSIS — I160 Hypertensive urgency: Secondary | ICD-10-CM | POA: Insufficient documentation

## 2012-07-30 DIAGNOSIS — I495 Sick sinus syndrome: Secondary | ICD-10-CM

## 2012-07-30 LAB — PACEMAKER DEVICE OBSERVATION
ATRIAL PACING PM: 97
BATTERY VOLTAGE: 2.79 V
RV LEAD AMPLITUDE: 5.6 mv
RV LEAD IMPEDENCE PM: 773 Ohm
VENTRICULAR PACING PM: 26

## 2012-07-30 MED ORDER — CARVEDILOL 12.5 MG PO TABS: 12.5000 mg | ORAL_TABLET | Freq: Two times a day (BID) | ORAL | Status: DC

## 2012-07-30 NOTE — Patient Instructions (Addendum)
Your physician wants you to follow-up in: 12 months with Dr Jacquiline Doe will receive a reminder letter in the mail two months in advance. If you don't receive a letter, please call our office to schedule the follow-up appointment.    Remote monitoring is used to monitor your Pacemaker of ICD from home. This monitoring reduces the number of office visits required to check your device to one time per year. It allows [place] to keep an eye on the functioning of your device to ensure it is working properly. You are scheduled for a device check from home on 11/01/12. You may send your transmission at any time that day. If you have a wireless device, the transmission will be sent automatically. After your physician reviews your transmission, you will receive a postcard with your next transmission date.   Your physician has recommended you make the following change in your medication:  1) Increase Carvedilol to 12.5mg  twice daily

## 2012-07-30 NOTE — Progress Notes (Signed)
Primary Cardiologist Dr Patty Sermons  The patient presents today for routine electrophysiology followup.  Since last being seen in our clinic, the patient reports doing very well.  Today, she denies symptoms of palpitations, chest pain, shortness of breath, orthopnea, PND, lower extremity edema, dizziness, presyncope, syncope, or neurologic sequela.  The patient feels that she is tolerating medications without difficulties and is otherwise without complaint today.   Past Medical History  Diagnosis Date  . Hypertension   . Diabetes mellitus     NON INSULIN DEPENDENT  . Hypercholesterolemia   . DJD (degenerative joint disease)   . History of diabetic neuropathy   . Chest pain   . DOE (dyspnea on exertion)   . IHD (ischemic heart disease)   . SSS (sick sinus syndrome)     s/p PPM by JA for SSS and syncope 9/11  . Breast cancer   . GERD (gastroesophageal reflux disease)   . Hemorrhoids   . CAD (coronary artery disease)    Past Surgical History  Procedure Date  . Cardiac catheterization 07/05/2009    EF 55-60%  . Insert / replace / remove pacemaker 9/11    SSS and syncope, implanted by JA (MDT)  . Mastectomy 1992    BILATERAL WITH RECONSTRUCTION  . Colonoscopy     Current Outpatient Prescriptions  Medication Sig Dispense Refill  . aspirin 81 MG tablet Take 81 mg by mouth daily.        . Calcium Carbonate-Vitamin D (CALCIUM + D PO) Take 600 mg by mouth daily.        . carvedilol (COREG) 6.25 MG tablet TAKE ONE TABLET BY MOUTH TWICE DAILY  60 tablet  12  . CRESTOR 10 MG tablet TAKE ONE TABLET BY MOUTH EVERY DAY  90 each  3  . Cyanocobalamin (VITAMIN B12 PO) Take by mouth daily.        . famotidine (PEPCID) 20 MG tablet TAKE ONE TABLET BY MOUTH TWICE DAILY  180 tablet  3  . fish oil-omega-3 fatty acids 1000 MG capsule Take by mouth daily.        Marland Kitchen lisinopril (PRINIVIL,ZESTRIL) 10 MG tablet Take 1 tablet (10 mg total) by mouth 2 (two) times daily.  180 tablet  3  . LORazepam (ATIVAN) 1  MG tablet Take 1 mg by mouth every 8 (eight) hours as needed.        . metFORMIN (GLUCOPHAGE) 500 MG tablet TAKE ONE TABLET BY MOUTH EVERY DAY WITH BREAKFAST  90 tablet  1  . nitroGLYCERIN (NITROSTAT) 0.4 MG SL tablet Place 1 tablet (0.4 mg total) under the tongue every 5 (five) minutes as needed.  90 tablet  3  . DISCONTD: carvedilol (COREG) 6.25 MG tablet Take 6.25 mg by mouth 2 (two) times daily with a meal.        Allergies  Allergen Reactions  . Lyrica (Pregabalin)     wgt gain  . Sulfonamide Derivatives     History   Social History  . Marital Status: Widowed    Spouse Name: N/A    Number of Children: N/A  . Years of Education: N/A   Occupational History  . Not on file.   Social History Main Topics  . Smoking status: Never Smoker   . Smokeless tobacco: Not on file  . Alcohol Use: No  . Drug Use: No  . Sexually Active:    Other Topics Concern  . Not on file   Social History Narrative  . No narrative  on file    Family History  Problem Relation Age of Onset  . Cancer Mother   . Cancer Father     Physical Exam: Filed Vitals:   07/30/12 0922  BP: 182/86  Pulse: 87  Height: 5\' 3"  (1.6 m)  Weight: 154 lb (69.854 kg)  SpO2: 98%   Repeat BP by MD 162/86 GEN- The patient is well appearing, alert and oriented x 3 today.   Head- normocephalic, atraumatic Eyes-  Sclera clear, conjunctiva pink Ears- hearing intact Oropharynx- clear Neck- supple, no JVP Lymph- no cervical lymphadenopathy Lungs- Clear to ausculation bilaterally, normal work of breathing Chest- pacemaker pocket is well healed Heart- Regular rate and rhythm, no murmurs, rubs or gallops, PMI not laterally displaced GI- soft, NT, ND, + BS Extremities- no clubbing, cyanosis, or edema MS- no significant deformity or atrophy Skin- no rash or lesion Psych- euthymic mood, full affect Neuro- strength and sensation are intact  Pacemaker interrogation- reviewed in detail today,  See PACEART  report  Assessment and Plan:   SICK SINUS SYNDROME  Normal pacemaker function  See Pace Art report  No changes today   HYPERTENSION  Elevated today, Increase coreg to 12.5mg  BID  Follow-up with Dr Patty Sermons I will see again in 12 months

## 2012-08-11 DIAGNOSIS — E119 Type 2 diabetes mellitus without complications: Secondary | ICD-10-CM | POA: Diagnosis not present

## 2012-09-03 ENCOUNTER — Encounter (HOSPITAL_COMMUNITY): Payer: Self-pay | Admitting: Emergency Medicine

## 2012-09-03 ENCOUNTER — Emergency Department (INDEPENDENT_AMBULATORY_CARE_PROVIDER_SITE_OTHER)
Admission: EM | Admit: 2012-09-03 | Discharge: 2012-09-03 | Disposition: A | Discharge status: Home / Self Care | Payer: Medicare Other | Source: Home / Self Care | Attending: Emergency Medicine | Admitting: Emergency Medicine

## 2012-09-03 DIAGNOSIS — S00419A Abrasion of unspecified ear, initial encounter: Secondary | ICD-10-CM

## 2012-09-03 DIAGNOSIS — H6120 Impacted cerumen, unspecified ear: Secondary | ICD-10-CM | POA: Diagnosis not present

## 2012-09-03 MED ORDER — NEOMYCIN-POLYMYXIN-HC 3.5-10000-1 OT SUSP: 4.0000 [drp] | Freq: Three times a day (TID) | OTIC | Status: AC

## 2012-09-03 NOTE — ED Notes (Signed)
Reports she was going to get hearing aids this past week.  Was told ears needed cleaning, "full of wax" patient hear today for wax removal

## 2012-09-03 NOTE — ED Provider Notes (Signed)
History     CSN: [id number]  Arrival date & time 09/03/12  1026   First MD Initiated Contact with Patient 09/03/12 1034      Chief Complaint  Patient presents with  . Hearing Problem    (Consider location/radiation/quality/duration/timing/severity/associated sxs/prior treatment) HPI Comments: " Patient presents to urgent care after been sent here from a hearing aid facility she was getting ready to get a new pair.Marland Kitchenshe was told that her right ear canal is obstructed with ear wax that she needed to have irrigated. Patient denies any pain, drainage in minimal discomfort with right ear traction.  The history is provided by the patient.    Past Medical History  Diagnosis Date  . Hypertension   . Diabetes mellitus     NON INSULIN DEPENDENT  . Hypercholesterolemia   . DJD (degenerative joint disease)   . History of diabetic neuropathy   . Chest pain   . DOE (dyspnea on exertion)   . IHD (ischemic heart disease)   . SSS (sick sinus syndrome)     s/p PPM by JA for SSS and syncope 9/11  . Breast cancer   . GERD (gastroesophageal reflux disease)   . Hemorrhoids   . CAD (coronary artery disease)     Past Surgical History  Procedure Date  . Cardiac catheterization 07/05/2009    EF 55-60%  . Insert / replace / remove pacemaker 9/11    SSS and syncope, implanted by JA (MDT)  . Mastectomy 1992    BILATERAL WITH RECONSTRUCTION  . Colonoscopy     Family History  Problem Relation Age of Onset  . Cancer Mother   . Cancer Father     History  Substance Use Topics  . Smoking status: Never Smoker   . Smokeless tobacco: Not on file  . Alcohol Use: No    OB History    Grav Para Term Preterm Abortions TAB SAB Ect Mult Living                  Review of Systems  Constitutional: Negative for fever, chills, diaphoresis, activity change, appetite change and fatigue.  HENT: Positive for ear pain. Negative for congestion, facial swelling, mouth sores, neck pain and tinnitus.     Genitourinary: Negative for dysuria, urgency, frequency and flank pain.  Musculoskeletal: Negative for arthralgias.  Skin: Negative for rash.  Neurological: Negative for dizziness.    Allergies  Lyrica and Sulfonamide derivatives  Home Medications   Current Outpatient Rx  Name Route Sig Dispense Refill  . ASPIRIN 81 MG PO TABS Oral Take 81 mg by mouth daily.      Marland Kitchen CALCIUM + D PO Oral Take 600 mg by mouth daily.      Marland Kitchen CARVEDILOL 12.5 MG PO TABS Oral Take 1 tablet (12.5 mg total) by mouth 2 (two) times daily with a meal. 60 tablet 12  . CRESTOR 10 MG PO TABS  TAKE ONE TABLET BY MOUTH EVERY DAY 90 each 3  . VITAMIN B12 PO Oral Take by mouth daily.      Marland Kitchen FAMOTIDINE 20 MG PO TABS  TAKE ONE TABLET BY MOUTH TWICE DAILY 180 tablet 3  . OMEGA-3 FATTY ACIDS 1000 MG PO CAPS Oral Take by mouth daily.      Marland Kitchen LISINOPRIL 10 MG PO TABS Oral Take 1 tablet (10 mg total) by mouth 2 (two) times daily. 180 tablet 3  . LORAZEPAM 1 MG PO TABS Oral Take 1 mg by mouth every 8 (  eight) hours as needed.      Marland Kitchen METFORMIN HCL 500 MG PO TABS  TAKE ONE TABLET BY MOUTH EVERY DAY WITH BREAKFAST 90 tablet 3  . NITROGLYCERIN 0.4 MG SL SUBL Sublingual Place 1 tablet (0.4 mg total) under the tongue every 5 (five) minutes as needed. 90 tablet 3    BP 152/85  Pulse 75  Temp 97.1 F (36.2 C)  Resp 20  SpO2 98%  Physical Exam  Nursing note and vitals reviewed. Constitutional: Vital signs are normal. She appears well-developed and well-nourished.  Non-toxic appearance. She does not have a sickly appearance. She does not appear ill. No distress.  HENT:  Right Ear: Hearing, tympanic membrane, external ear and ear canal normal. No drainage or tenderness. No middle ear effusion.  Left Ear: Tympanic membrane and ear canal normal. No drainage or tenderness.  No middle ear effusion.  Ears:  Eyes: Conjunctivae normal are normal.  Neck: Neck supple.  Cardiovascular: Exam reveals no friction rub.   No murmur  heard. Pulmonary/Chest: Effort normal.  Skin: No erythema.    ED Course  Procedures (including critical care time)  Labs Reviewed - No data to display No results found.   1. Cerumen impaction       MDM   Partial R cerumen obstruction       Jimmie Molly, MD 09/03/12 1253

## 2012-10-13 ENCOUNTER — Ambulatory Visit: Payer: Medicare Other | Admitting: Cardiology

## 2012-10-13 ENCOUNTER — Other Ambulatory Visit: Payer: Medicare Other

## 2012-10-21 ENCOUNTER — Encounter: Payer: Self-pay | Admitting: Cardiology

## 2012-10-21 ENCOUNTER — Telehealth: Payer: Self-pay | Admitting: *Deleted

## 2012-10-21 ENCOUNTER — Ambulatory Visit (INDEPENDENT_AMBULATORY_CARE_PROVIDER_SITE_OTHER): Payer: Medicare Other | Admitting: Cardiology

## 2012-10-21 VITALS — BP 146/90 | HR 84 | Ht 63.0 in | Wt 154.0 lb

## 2012-10-21 DIAGNOSIS — G629 Polyneuropathy, unspecified: Secondary | ICD-10-CM

## 2012-10-21 DIAGNOSIS — I2510 Atherosclerotic heart disease of native coronary artery without angina pectoris: Secondary | ICD-10-CM | POA: Diagnosis not present

## 2012-10-21 DIAGNOSIS — I119 Hypertensive heart disease without heart failure: Secondary | ICD-10-CM

## 2012-10-21 DIAGNOSIS — G609 Hereditary and idiopathic neuropathy, unspecified: Secondary | ICD-10-CM

## 2012-10-21 DIAGNOSIS — E7800 Pure hypercholesterolemia, unspecified: Secondary | ICD-10-CM | POA: Diagnosis not present

## 2012-10-21 DIAGNOSIS — I495 Sick sinus syndrome: Secondary | ICD-10-CM

## 2012-10-21 LAB — HEPATIC FUNCTION PANEL
ALT: 13 U/L (ref 0–35)
Total Bilirubin: 0.7 mg/dL (ref 0.3–1.2)

## 2012-10-21 LAB — LIPID PANEL
Cholesterol: 130 mg/dL (ref 0–200)
LDL Cholesterol: 62 mg/dL (ref 0–99)
Triglycerides: 80 mg/dL (ref 0.0–149.0)

## 2012-10-21 LAB — BASIC METABOLIC PANEL
BUN: 17 mg/dL (ref 6–23)
Calcium: 9.6 mg/dL (ref 8.4–10.5)
Chloride: 107 mEq/L (ref 96–112)
Creatinine, Ser: 0.9 mg/dL (ref 0.4–1.2)
GFR: 63.46 mL/min (ref >60.00)

## 2012-10-21 NOTE — Assessment & Plan Note (Signed)
The patient's blood pressure is elevated today which she attributes to white coat syndrome.  Normally her blood pressure at home is in normal range.  She denies any symptoms of congestive heart failure or palpitations.

## 2012-10-21 NOTE — Progress Notes (Signed)
Jessica [name] Date of Birth:  [birth year] [facility] [address] [place], [place]  [address] [phone]         Fax   [phone]  History of Present Illness: This pleasant 78 year old woman is seen for a four-month followup office visit. She has a complex past medical history.  She has a history of sick sinus syndrome and has a permanent pacemaker. She has had no further episodes of syncope since the pacemaker implantation. She has a history of ischemic heart disease and underwent insertion of 2 drug-eluting stents placed in the mid and distal right coronary artery in September 2000. She has a history of diabetes, essential hypertension, and high cholesterol. An echocardiogram in September 2011 showed normal ejection fraction of 60-65% with diastolic dysfunction.   Current Outpatient Prescriptions  Medication Sig Dispense Refill  . aspirin 81 MG tablet Take 81 mg by mouth daily.        . Calcium Carbonate-Vitamin D (CALCIUM + D PO) Take 600 mg by mouth daily.        . carvedilol (COREG) 12.5 MG tablet Take 1 tablet (12.5 mg total) by mouth 2 (two) times daily with a meal.  60 tablet  12  . CRESTOR 10 MG tablet TAKE ONE TABLET BY MOUTH EVERY DAY  90 each  3  . Cyanocobalamin (VITAMIN B12 PO) Take by mouth daily.        . famotidine (PEPCID) 20 MG tablet TAKE ONE TABLET BY MOUTH TWICE DAILY  180 tablet  3  . fish oil-omega-3 fatty acids 1000 MG capsule Take by mouth daily.        Marland Kitchen lisinopril (PRINIVIL,ZESTRIL) 10 MG tablet Take 1 tablet (10 mg total) by mouth 2 (two) times daily.  180 tablet  3  . LORazepam (ATIVAN) 1 MG tablet Take 1 mg by mouth every 8 (eight) hours as needed.        . metFORMIN (GLUCOPHAGE) 500 MG tablet TAKE ONE TABLET BY MOUTH EVERY DAY WITH BREAKFAST  90 tablet  3  . nitroGLYCERIN (NITROSTAT) 0.4 MG SL tablet Place 1 tablet (0.4 mg total) under the tongue every 5 (five) minutes as needed.  90 tablet  3    Allergies  Allergen Reactions  .  Lyrica (Pregabalin)     wgt gain  . Sulfonamide Derivatives     Patient Active Problem List  Diagnosis  . DM  . HYPERCHOLESTEROLEMIA  . CAD  . SICK SINUS SYNDROME  . DEGENERATIVE JOINT DISEASE  . Benign hypertensive heart disease without heart failure  . Pacemaker-[employer]tronic  . Hypertension    History  Smoking status  . Never Smoker   Smokeless tobacco  . Not on file    History  Alcohol Use No    Family History  Problem Relation Age of Onset  . Cancer Mother   . Cancer Father     Review of Systems: Constitutional: no fever chills diaphoresis or fatigue or change in weight.  Head and neck: no hearing loss, no epistaxis, no photophobia or visual disturbance. Respiratory: No cough, shortness of breath or wheezing. Cardiovascular: No chest pain peripheral edema, palpitations. Gastrointestinal: No abdominal distention, no abdominal pain, no change in bowel habits hematochezia or melena. Genitourinary: No dysuria, no frequency, no urgency, no nocturia. Musculoskeletal:No arthralgias, no back pain, no gait disturbance or myalgias. Neurological: No dizziness, no headaches, no numbness, no seizures, no syncope, no weakness, no tremors. Hematologic: No lymphadenopathy, no easy bruising. Psychiatric: No confusion, no  hallucinations, no sleep disturbance.    Physical Exam: Filed Vitals:   10/21/12 1135  BP: 146/90  Pulse: 84   general appearance reveals a well-developed well-nourished woman in no distress.The head and neck exam reveals pupils equal and reactive.  Extraocular movements are full.  There is no scleral icterus.  The mouth and pharynx are normal.  The neck is supple.  The carotids reveal no bruits.  The jugular venous pressure is normal.  The  thyroid is not enlarged.  There is no lymphadenopathy.  The chest is clear to percussion and auscultation.  There are no rales or rhonchi.  Expansion of the chest is symmetrical.  The precordium is quiet.  The first heart  sound is normal.  The second heart sound is physiologically split.  There is no murmur gallop rub or click.  There is no abnormal lift or heave.  The abdomen is soft and nontender.  The bowel sounds are normal.  The liver and spleen are not enlarged.  There are no abdominal masses.  There are no abdominal bruits.  Extremities reveal good pedal pulses.  There is no phlebitis or edema.  There is no cyanosis or clubbing.  Strength is normal and symmetrical in all extremities.  There is no lateralizing weakness.  There are no sensory deficits.  The skin is warm and dry.  There is no rash.     Assessment / Plan: Continue same medication.  Continue careful low-cholesterol diabetic diet.  Check in 5 months for followup office visit and fasting lab work and hemoglobin A1c.

## 2012-10-21 NOTE — Assessment & Plan Note (Signed)
The patient has a history of hypercholesterolemia.  She is on Crestor 10 mg daily.  Is not having any side effects from the Crestor.  Work today is pending.

## 2012-10-21 NOTE — Progress Notes (Signed)
Quick Note:  Please report to patient. The recent labs are stable. Continue same medication and careful diet. Hemoglobin A1c is excellent at 6.1 ______

## 2012-10-21 NOTE — Patient Instructions (Addendum)
Your physician recommends that you continue on your current medications as directed. Please refer to the Current Medication list given to you today.  Your physician wants you to follow-up in: 5 months You will receive a reminder letter in the mail two months in advance. If you don't receive a letter, please call our office to schedule the follow-up appointment.   Will obtain labs today and call you with the results (lp/bmet/hfp/A1c)

## 2012-10-21 NOTE — Telephone Encounter (Signed)
Mailed copy of labs and left message to call if any questions  

## 2012-10-21 NOTE — Assessment & Plan Note (Signed)
No further episodes of syncope since her pacemaker was placed

## 2012-10-21 NOTE — Telephone Encounter (Signed)
Message copied by Burnell Blanks on Thu Oct 21, 2012  6:25 PM ------      Message from: [name]      Created: Thu Oct 21, 2012  4:41 PM       Please report to patient.  The recent labs are stable. Continue same medication and careful diet.  Hemoglobin A1c is excellent at 6.1

## 2012-10-21 NOTE — Assessment & Plan Note (Signed)
The patient has not had any recurrence of angina pectoris or ischemic heart disease symptoms.

## 2012-11-01 ENCOUNTER — Encounter: Payer: Medicare Other | Admitting: *Deleted

## 2012-11-05 ENCOUNTER — Encounter: Payer: Self-pay | Admitting: *Deleted

## 2012-12-03 ENCOUNTER — Encounter: Payer: Self-pay | Admitting: *Deleted

## 2013-01-05 ENCOUNTER — Other Ambulatory Visit: Payer: Self-pay | Admitting: *Deleted

## 2013-01-05 MED ORDER — FAMOTIDINE 20 MG PO TABS: 20.0000 mg | ORAL_TABLET | Freq: Two times a day (BID) | ORAL | Status: DC

## 2013-01-13 ENCOUNTER — Other Ambulatory Visit: Payer: Self-pay | Admitting: Internal Medicine

## 2013-01-13 ENCOUNTER — Ambulatory Visit (INDEPENDENT_AMBULATORY_CARE_PROVIDER_SITE_OTHER): Payer: Medicare Other | Admitting: *Deleted

## 2013-01-13 DIAGNOSIS — I495 Sick sinus syndrome: Secondary | ICD-10-CM | POA: Diagnosis not present

## 2013-01-13 LAB — PACEMAKER DEVICE OBSERVATION
BATTERY VOLTAGE: 2.8 V
RV LEAD AMPLITUDE: 4 mv
VENTRICULAR PACING PM: 21.7

## 2013-01-13 NOTE — Progress Notes (Signed)
Pacer check in clinic  

## 2013-02-10 ENCOUNTER — Emergency Department (HOSPITAL_COMMUNITY): Payer: Medicare Other

## 2013-02-10 ENCOUNTER — Encounter (HOSPITAL_COMMUNITY): Payer: Self-pay

## 2013-02-10 ENCOUNTER — Observation Stay (HOSPITAL_COMMUNITY)
Admission: EM | Admit: 2013-02-10 | Discharge: 2013-02-11 | Disposition: A | Discharge status: Home / Self Care | Payer: Medicare Other | Attending: Internal Medicine | Admitting: Internal Medicine

## 2013-02-10 DIAGNOSIS — E7800 Pure hypercholesterolemia, unspecified: Secondary | ICD-10-CM

## 2013-02-10 DIAGNOSIS — W010XXA Fall on same level from slipping, tripping and stumbling without subsequent striking against object, initial encounter: Secondary | ICD-10-CM | POA: Insufficient documentation

## 2013-02-10 DIAGNOSIS — I160 Hypertensive urgency: Secondary | ICD-10-CM | POA: Diagnosis present

## 2013-02-10 DIAGNOSIS — I5189 Other ill-defined heart diseases: Secondary | ICD-10-CM | POA: Diagnosis present

## 2013-02-10 DIAGNOSIS — I119 Hypertensive heart disease without heart failure: Secondary | ICD-10-CM

## 2013-02-10 DIAGNOSIS — R9431 Abnormal electrocardiogram [ECG] [EKG]: Secondary | ICD-10-CM | POA: Insufficient documentation

## 2013-02-10 DIAGNOSIS — M1990 Unspecified osteoarthritis, unspecified site: Secondary | ICD-10-CM

## 2013-02-10 DIAGNOSIS — E1142 Type 2 diabetes mellitus with diabetic polyneuropathy: Secondary | ICD-10-CM | POA: Diagnosis not present

## 2013-02-10 DIAGNOSIS — M79609 Pain in unspecified limb: Secondary | ICD-10-CM | POA: Diagnosis not present

## 2013-02-10 DIAGNOSIS — M7989 Other specified soft tissue disorders: Secondary | ICD-10-CM | POA: Diagnosis not present

## 2013-02-10 DIAGNOSIS — S8990XA Unspecified injury of unspecified lower leg, initial encounter: Secondary | ICD-10-CM | POA: Diagnosis not present

## 2013-02-10 DIAGNOSIS — E1149 Type 2 diabetes mellitus with other diabetic neurological complication: Secondary | ICD-10-CM | POA: Insufficient documentation

## 2013-02-10 DIAGNOSIS — E119 Type 2 diabetes mellitus without complications: Secondary | ICD-10-CM

## 2013-02-10 DIAGNOSIS — I209 Angina pectoris, unspecified: Secondary | ICD-10-CM | POA: Diagnosis present

## 2013-02-10 DIAGNOSIS — Y9301 Activity, walking, marching and hiking: Secondary | ICD-10-CM | POA: Insufficient documentation

## 2013-02-10 DIAGNOSIS — S6990XA Unspecified injury of unspecified wrist, hand and finger(s), initial encounter: Secondary | ICD-10-CM | POA: Diagnosis not present

## 2013-02-10 DIAGNOSIS — R51 Headache: Principal | ICD-10-CM | POA: Insufficient documentation

## 2013-02-10 DIAGNOSIS — S8000XA Contusion of unspecified knee, initial encounter: Secondary | ICD-10-CM | POA: Insufficient documentation

## 2013-02-10 DIAGNOSIS — I2510 Atherosclerotic heart disease of native coronary artery without angina pectoris: Secondary | ICD-10-CM

## 2013-02-10 DIAGNOSIS — S199XXA Unspecified injury of neck, initial encounter: Secondary | ICD-10-CM | POA: Diagnosis not present

## 2013-02-10 DIAGNOSIS — Z9861 Coronary angioplasty status: Secondary | ICD-10-CM | POA: Diagnosis not present

## 2013-02-10 DIAGNOSIS — Y9210 Unspecified residential institution as the place of occurrence of the external cause: Secondary | ICD-10-CM | POA: Insufficient documentation

## 2013-02-10 DIAGNOSIS — I519 Heart disease, unspecified: Secondary | ICD-10-CM | POA: Insufficient documentation

## 2013-02-10 DIAGNOSIS — I10 Essential (primary) hypertension: Secondary | ICD-10-CM | POA: Diagnosis not present

## 2013-02-10 DIAGNOSIS — E1140 Type 2 diabetes mellitus with diabetic neuropathy, unspecified: Secondary | ICD-10-CM | POA: Diagnosis present

## 2013-02-10 DIAGNOSIS — Z950 Presence of cardiac pacemaker: Secondary | ICD-10-CM

## 2013-02-10 DIAGNOSIS — S99929A Unspecified injury of unspecified foot, initial encounter: Secondary | ICD-10-CM | POA: Diagnosis not present

## 2013-02-10 DIAGNOSIS — I495 Sick sinus syndrome: Secondary | ICD-10-CM

## 2013-02-10 DIAGNOSIS — S0990XA Unspecified injury of head, initial encounter: Secondary | ICD-10-CM | POA: Diagnosis not present

## 2013-02-10 LAB — BASIC METABOLIC PANEL
Calcium: 9.6 mg/dL (ref 8.4–10.5)
GFR calc Af Amer: 67 mL/min — ABNORMAL LOW (ref >90)
GFR calc non Af Amer: 58 mL/min — ABNORMAL LOW (ref >90)
Glucose, Bld: 102 mg/dL — ABNORMAL HIGH (ref 70–99)
Potassium: 3.9 mEq/L (ref 3.5–5.1)
Sodium: 141 mEq/L (ref 135–145)

## 2013-02-10 LAB — CBC
Hemoglobin: 12.5 g/dL (ref 12.0–15.0)
MCH: 29.8 pg (ref 26.0–34.0)
MCHC: 35.1 g/dL (ref 30.0–36.0)
Platelets: 244 10*3/uL (ref 150–400)
RDW: 14 % (ref 11.5–15.5)

## 2013-02-10 LAB — URINALYSIS, ROUTINE W REFLEX MICROSCOPIC
Hgb urine dipstick: NEGATIVE
Leukocytes, UA: NEGATIVE
Nitrite: NEGATIVE
Protein, ur: NEGATIVE mg/dL
Specific Gravity, Urine: 1.01 (ref 1.005–1.030)
Urobilinogen, UA: 1 mg/dL (ref 0.0–1.0)

## 2013-02-10 LAB — CK: Total CK: 55 U/L (ref 7–177)

## 2013-02-10 LAB — POCT I-STAT TROPONIN I: Troponin i, poc: 0 ng/mL (ref 0.00–0.08)

## 2013-02-10 MED ORDER — ACETAMINOPHEN 650 MG RE SUPP: 650.0000 mg | Freq: Four times a day (QID) | RECTAL | Status: DC | PRN

## 2013-02-10 MED ORDER — OMEGA-3-ACID ETHYL ESTERS 1 G PO CAPS
1.0000 g | ORAL_CAPSULE | Freq: Every day | ORAL | Status: DC
  Administered 2013-02-11: 1 g via ORAL
  Filled 2013-02-10: qty 1

## 2013-02-10 MED ORDER — LORAZEPAM 1 MG PO TABS: 1.0000 mg | ORAL_TABLET | Freq: Once | ORAL | Status: DC

## 2013-02-10 MED ORDER — LORAZEPAM 2 MG/ML IJ SOLN
0.5000 mg | Freq: Once | INTRAMUSCULAR | Status: AC
  Administered 2013-02-10: 0.5 mg via INTRAVENOUS
  Filled 2013-02-10: qty 1

## 2013-02-10 MED ORDER — ATORVASTATIN CALCIUM 20 MG PO TABS
20.0000 mg | ORAL_TABLET | Freq: Every day | ORAL | Status: DC
  Filled 2013-02-10: qty 1

## 2013-02-10 MED ORDER — HYDRALAZINE HCL 20 MG/ML IJ SOLN
10.0000 mg | Freq: Once | INTRAMUSCULAR | Status: DC
  Filled 2013-02-10: qty 1

## 2013-02-10 MED ORDER — ENOXAPARIN SODIUM 40 MG/0.4ML ~~LOC~~ SOLN
40.0000 mg | SUBCUTANEOUS | Status: DC
  Administered 2013-02-10: 40 mg via SUBCUTANEOUS
  Filled 2013-02-10 (×2): qty 0.4

## 2013-02-10 MED ORDER — ASPIRIN 81 MG PO CHEW
81.0000 mg | CHEWABLE_TABLET | Freq: Every day | ORAL | Status: DC
  Administered 2013-02-11: 81 mg via ORAL
  Filled 2013-02-10: qty 1

## 2013-02-10 MED ORDER — OXYCODONE HCL 5 MG PO TABS: 5.0000 mg | ORAL_TABLET | ORAL | Status: DC | PRN

## 2013-02-10 MED ORDER — HYDRALAZINE HCL 20 MG/ML IJ SOLN: 10.0000 mg | Freq: Four times a day (QID) | INTRAMUSCULAR | Status: DC | PRN

## 2013-02-10 MED ORDER — NITROGLYCERIN 0.4 MG SL SUBL: 0.4000 mg | SUBLINGUAL_TABLET | SUBLINGUAL | Status: DC | PRN

## 2013-02-10 MED ORDER — INSULIN ASPART 100 UNIT/ML ~~LOC~~ SOLN
0.0000 [IU] | Freq: Three times a day (TID) | SUBCUTANEOUS | Status: DC
  Administered 2013-02-11: 2 [IU] via SUBCUTANEOUS

## 2013-02-10 MED ORDER — LORAZEPAM 0.5 MG PO TABS: 0.5000 mg | ORAL_TABLET | Freq: Three times a day (TID) | ORAL | Status: DC | PRN

## 2013-02-10 MED ORDER — LABETALOL HCL 5 MG/ML IV SOLN
20.0000 mg | Freq: Once | INTRAVENOUS | Status: AC
  Administered 2013-02-10: 20 mg via INTRAVENOUS
  Filled 2013-02-10: qty 4

## 2013-02-10 MED ORDER — ACETAMINOPHEN 325 MG PO TABS: 650.0000 mg | ORAL_TABLET | Freq: Four times a day (QID) | ORAL | Status: DC | PRN

## 2013-02-10 MED ORDER — CARVEDILOL 12.5 MG PO TABS
12.5000 mg | ORAL_TABLET | Freq: Two times a day (BID) | ORAL | Status: DC
  Administered 2013-02-11: 12.5 mg via ORAL
  Filled 2013-02-10 (×3): qty 1

## 2013-02-10 MED ORDER — METOPROLOL TARTRATE 1 MG/ML IV SOLN
5.0000 mg | Freq: Once | INTRAVENOUS | Status: AC
  Administered 2013-02-10: 5 mg via INTRAVENOUS
  Filled 2013-02-10: qty 5

## 2013-02-10 MED ORDER — LISINOPRIL 10 MG PO TABS
10.0000 mg | ORAL_TABLET | Freq: Two times a day (BID) | ORAL | Status: DC
  Administered 2013-02-10 – 2013-02-11 (×2): 10 mg via ORAL
  Filled 2013-02-10 (×4): qty 1

## 2013-02-10 MED ORDER — SODIUM CHLORIDE 0.9 % IV SOLN
INTRAVENOUS | Status: DC
  Administered 2013-02-10: 22:00:00 via INTRAVENOUS

## 2013-02-10 MED ORDER — OMEGA-3 FATTY ACIDS 1000 MG PO CAPS: 1.0000 g | ORAL_CAPSULE | Freq: Every morning | ORAL | Status: DC

## 2013-02-10 MED ORDER — ASPIRIN 81 MG PO TABS: 81.0000 mg | ORAL_TABLET | Freq: Every morning | ORAL | Status: DC

## 2013-02-10 NOTE — H&P (Signed)
[facility] History and Physical  Jessica [name] YQI:[id number] DOB: 10-02-1927 DOA: 02/10/2013   PCP: No primary provider on file.   Chief Complaint: fall, question syncope  HPI:  79 year old female medical history of coronary artery disease status post DES x 2 sept 2011, SSS, HTN, HLD was here at [facility] visiting her friend for her and she is the primary caretaker when the patient claims to have tripped over a blanket and fell onto her face hit her for head and left side of her head. The patient denies any syncope adamantly. She states that she was a little time. However, the nursing staff that witnessed the event had concerns that the patient actually had syncope. The patient was placed on a stretcher and brought to the emergency department. The patient has been her usual state of health without any major complaints. She denies any recent fevers, chills, chest discomfort, shortness of breath, nausea, vomiting, diarrhea, headaches, dizziness, focal extremity weakness. During this episode, the patient did not have any prodromal symptoms whatsoever. She did not have any urinary or bowel incontinence. She did not bite her tongue. Of note, the patient has history of syncope which was thought to be due to her sick sinus syndrome. The patient had a pacemaker placed in 2012 after which the patient had no more syncopal episodes. She recently had her pacemaker interrogated by Dr. Johney Frame on 01/13/13, and it was normally functioning.  In emergency department, the patient was found to be extremely hypertensive with a systolic blood pressure of 211/98. The patient was given Lopressor IV 5 mg. There was no improvement. The patient was only given labetalol 20 mg IV x1. Her blood pressure was 196/80 during my interview. EKG showed T wave inversions in II,III, aVF as well as V3, V4.troponins are pending at the time of my interview. The patient is totally asymptomatic without any chest discomfort or  shortness of breath. In fact,the patient wants to go home.BMP and CBC were essentially unremarkable.CT brain was unremarkable as well as CT of the cervical spine. The CT cervical spine showed only spondylosis. Assessment/Plan: Mechanical fall with concerns of syncope -PPM was interrogated 01/13/2013--functioning normally -Echocardiogram -Orthostatic vitals -Judicious IV fluids--only ordered 1 L -Echocardiogram 08/02/2010 showed ejection fraction 60-65%, grade 2 diastolic dysfunction -TSH -CPK Abnormal EKG -Cycle troponins -Repeat EKG in the morning -Echocardiogram -May need cardiology evaluation Hypertensive urgency -Ordered hydralazine 10 mg IV x1 and when necessary every 6 hours -Restart carvedilol and lisinopril -Patient endorses compliance with her medications -Urine drug screen Diabetes mellitus type 2 -Discontinue metformin -novolog ISS while in hospital -AIC Hyperlipidemia -continue statin       Past Medical History  Diagnosis Date  . Hypertension   . Diabetes mellitus     NON INSULIN DEPENDENT  . Hypercholesterolemia   . DJD (degenerative joint disease)   . History of diabetic neuropathy   . Chest pain   . DOE (dyspnea on exertion)   . IHD (ischemic heart disease)   . SSS (sick sinus syndrome)     s/p PPM by JA for SSS and syncope 9/11  . Breast cancer   . GERD (gastroesophageal reflux disease)   . Hemorrhoids   . CAD (coronary artery disease)    Past Surgical History  Procedure Laterality Date  . Cardiac catheterization  07/05/2009    EF 55-60%  . Insert / replace / remove pacemaker  9/11    SSS and syncope, implanted by JA (MDT)  . Mastectomy  1992  BILATERAL WITH RECONSTRUCTION  . Colonoscopy     Social History:  reports that she has never smoked. She does not have any smokeless tobacco history on file. She reports that she does not drink alcohol or use illicit drugs.   Family History  Problem Relation Age of Onset  . Cancer Mother   .  Cancer Father      Allergies  Allergen Reactions  . Lyrica (Pregabalin)     wgt gain  . Sulfonamide Derivatives Rash      Prior to Admission medications   Medication Sig Start Date End Date Taking? Authorizing Provider  aspirin 81 MG tablet Take 81 mg by mouth every morning.    Yes Historical Provider, MD  Calcium Carbonate-Vitamin D (CALCIUM + D PO) Take 1 tablet by mouth every morning.    Yes Historical Provider, MD  carvedilol (COREG) 12.5 MG tablet Take 1 tablet (12.5 mg total) by mouth 2 (two) times daily with a meal. 07/30/12  Yes [name], MD  Cyanocobalamin (VITAMIN B12 PO) Take 1 tablet by mouth daily.    Yes Historical Provider, MD  famotidine (PEPCID) 20 MG tablet Take 1 tablet (20 mg total) by mouth 2 (two) times daily. 01/05/13  Yes [name], MD  fish oil-omega-3 fatty acids 1000 MG capsule Take 1 g by mouth every morning.    Yes Historical Provider, MD  lisinopril (PRINIVIL,ZESTRIL) 10 MG tablet Take 1 tablet (10 mg total) by mouth 2 (two) times daily. 01/28/12  Yes [name], MD  LORazepam (ATIVAN) 1 MG tablet Take 0.5 mg by mouth every 8 (eight) hours as needed for anxiety. For anxiety   Yes Historical Provider, MD  metFORMIN (GLUCOPHAGE) 500 MG tablet Take 500 mg by mouth daily with breakfast.   Yes Historical Provider, MD  nitroGLYCERIN (NITROSTAT) 0.4 MG SL tablet Place 0.4 mg under the tongue every 5 (five) minutes as needed for chest pain. x3 doses as needed for chest pain 04/16/11  Yes [name], MD  rosuvastatin (CRESTOR) 10 MG tablet Take 10 mg by mouth every morning.   Yes Historical Provider, MD    Review of Systems:  Constitutional:  No weight loss, night sweats, Fevers, chills, fatigue.  Head&Eyes: No headache.  No vision loss.  No eye pain or scotoma ENT:  No Difficulty swallowing,Tooth/dental problems,Sore throat,  No ear ache, post nasal drip,  Cardio-vascular:  No chest pain, Orthopnea, PND, swelling in lower extremities,   dizziness, palpitations  GI:  No  abdominal pain, nausea, vomiting, diarrhea, loss of appetite, hematochezia, melena, heartburn, indigestion, Resp:  No shortness of breath with exertion or at rest. No cough. No coughing up of blood .No wheezing.No chest wall deformity  Skin:  no rash or lesions.  GU:  no dysuria, change in color of urine, no urgency or frequency. No flank pain.  Musculoskeletal:  No joint pain or swelling. No decreased range of motion. No back pain.  Psych:  No change in mood or affect. No depression or anxiety. Neurologic: No headache, no dysesthesia, no focal weakness, no vision loss.   Physical Exam: Filed Vitals:   02/10/13 1630 02/10/13 1700 02/10/13 1830 02/10/13 1900  BP: 199/76 166/86 205/84 188/77  Pulse: 62 63 67 66  Resp: 14 21 23 26   Height:      Weight:      SpO2: 99% 99% 96% 98%   General:  A&O x 3, NAD, nontoxic, pleasant/cooperative Head/Eye: No conjunctival hemorrhage, no icterus, [place]/AT, No nystagmus ENT:  No icterus,  No thrush, good dentition, no pharyngeal exudate Neck:  No masses, no lymphadenpathy, no bruits CV:  RRR, no rub, no gallop, no S3 Lung:  CTAB, good air movement, no wheeze, no rhonchi Abdomen: soft/NT, +BS, nondistended, no peritoneal signs Ext: No cyanosis, No rashes, No petechiae, No lymphangitis, No edema;ecchymosis noted left periorbital area Neuro: CNII-XII intact, strength 4/5 in bilateral upper and lower extremities, no dysmetria  Labs on Admission:  Basic Metabolic Panel:  Recent Labs Lab 02/10/13 1300  NA 141  K 3.9  CL 109  CO2 25  GLUCOSE 102*  BUN 17  CREATININE 0.90  CALCIUM 9.6   Liver Function Tests: No results found for this basename: AST, ALT, ALKPHOS, BILITOT, PROT, ALBUMIN,  in the last 168 hours No results found for this basename: LIPASE, AMYLASE,  in the last 168 hours No results found for this basename: AMMONIA,  in the last 168 hours CBC:  Recent Labs Lab 02/10/13 1300  WBC 9.0  HGB  12.5  HCT 35.6*  MCV 85.0  PLT 244   Cardiac Enzymes: No results found for this basename: CKTOTAL, CKMB, CKMBINDEX, TROPONINI,  in the last 168 hours BNP: No components found with this basename: POCBNP,  CBG: No results found for this basename: GLUCAP,  in the last 168 hours  Radiological Exams on Admission: Ct Head Wo Contrast  02/10/2013  *RADIOLOGY REPORT*  Clinical Data:  Fall and trauma.  CT HEAD WITHOUT CONTRAST CT CERVICAL SPINE WITHOUT CONTRAST  Technique:  Multidetector CT imaging of the head and cervical spine was performed following the standard protocol without intravenous contrast.  Multiplanar CT image reconstructions of the cervical spine were also generated.  Comparison:   None  CT HEAD  Findings: No evidence for acute hemorrhage, mass lesion, midline shift, hydrocephalus or large infarct.  There is low density in the periventricular white matter suggesting chronic changes.  Soft tissue swelling along the left side of the forehead.  Both globes are intact.  The visualized sinuses are intact.  No evidence for a facial bone or calvarial fracture.  IMPRESSION:  No acute intracranial abnormality.  CT CERVICAL SPINE  Findings: No evidence for acute fracture or dislocation.  No evidence for an apical pneumothorax.  There is disc space narrowing at C5-C6.  Normal alignment at the cervicothoracic junction. Sub centimeter nodule in the right thyroid lobe.  IMPRESSION: Cervical spondylosis at C5-C6.  No acute bony abnormality.   Original Report Authenticated By: Richarda Overlie, M.D.    Ct Cervical Spine Wo Contrast  02/10/2013  *RADIOLOGY REPORT*  Clinical Data:  Fall and trauma.  CT HEAD WITHOUT CONTRAST CT CERVICAL SPINE WITHOUT CONTRAST  Technique:  Multidetector CT imaging of the head and cervical spine was performed following the standard protocol without intravenous contrast.  Multiplanar CT image reconstructions of the cervical spine were also generated.  Comparison:   None  CT HEAD   Findings: No evidence for acute hemorrhage, mass lesion, midline shift, hydrocephalus or large infarct.  There is low density in the periventricular white matter suggesting chronic changes.  Soft tissue swelling along the left side of the forehead.  Both globes are intact.  The visualized sinuses are intact.  No evidence for a facial bone or calvarial fracture.  IMPRESSION:  No acute intracranial abnormality.  CT CERVICAL SPINE  Findings: No evidence for acute fracture or dislocation.  No evidence for an apical pneumothorax.  There is disc space narrowing at C5-C6.  Normal alignment at the  cervicothoracic junction. Sub centimeter nodule in the right thyroid lobe.  IMPRESSION: Cervical spondylosis at C5-C6.  No acute bony abnormality.   Original Report Authenticated By: Richarda Overlie, M.D.    Dg Knee Complete 4 Views Right  02/10/2013  *RADIOLOGY REPORT*  Clinical Data: Fall  RIGHT KNEE - COMPLETE 4+ VIEW  Comparison: None.  Findings: Four views of the right knee submitted.  No acute fracture or subluxation.  Mild prepatellar soft tissue swelling. No joint effusion.  Diffuse osteopenia.  IMPRESSION: No acute fracture or subluxation.  Diffuse osteopenia.  Mild prepatellar soft tissue swelling.   Original Report Authenticated By: Natasha Mead, M.D.    Dg Hand Complete Left  02/10/2013  *RADIOLOGY REPORT*  Clinical Data: Fall, pain of the fifth metacarpal phalangeal joint  LEFT HAND - COMPLETE 3+ VIEW  Comparison: None.  Findings: Bones are osteopenic, which may mask subtle fracture.  On the oblique view, there is suggestion of cortical discontinuity at the base of the fifth metacarpal.  No dislocation.  No radiopaque foreign body.  IMPRESSION: There is cortical discontinuity at the base of the fifth metacarpal on one view only, which may indicate nondisplaced fracture. Correlate for point tenderness over this area and consider repeat imaging if symptoms persist and/or correlate.   Original Report Authenticated By:  Christiana Pellant, M.D.     EKG: Independently reviewed. Sinus rhythm, T-wave inversion II, III, aVF, paced    Time spent:70 minutes Code Status:   FULL Family Communication:   Family at bedside   Yovana Scogin, DO  [facility] Pager [phone]  If 7PM-7AM, please contact night-coverage [email or web address] Password [facility] 02/10/2013, 7:41 PM

## 2013-02-10 NOTE — ED Provider Notes (Signed)
History     CSN: [id number]  Arrival date & time 02/10/13  1221   First MD Initiated Contact with Patient 02/10/13 1242      Chief Complaint  Patient presents with  . Fall    (Consider location/radiation/quality/duration/timing/severity/associated sxs/prior treatment) Patient is a 79 y.o. female presenting with fall. The history is provided by the patient.  Fall The accident occurred less than 1 hour ago. The fall occurred while walking. She fell from a height of 3 to 5 ft. The point of impact was the head. The pain is mild. She was not ambulatory at the scene. There was no entrapment after the fall. There was no drug use involved in the accident. There was no alcohol use involved in the accident. Pertinent negatives include no visual change, no fever, no abdominal pain, no bowel incontinence, no nausea, no vomiting, no hematuria, no headaches, no hearing loss, no loss of consciousness and no tingling. Treatment on scene includes a c-collar and a backboard. She has tried nothing for the symptoms.   Patient was upstairs visiting a friend when she tripped over her blanket and fell striking her head. She denies any loss of consciousness. He was placed in a collar and a long spine board and transported to the emergency department. She had been removed from the spine board prior to my evaluation. Her cervical collar is still in place. She states she fell and struck her face. She has some pain in her knee. Otherwise she feels well. She denies any syncopal or lightheaded events. She has not had chest pain or dyspnea. Past Medical History  Diagnosis Date  . Hypertension   . Diabetes mellitus     NON INSULIN DEPENDENT  . Hypercholesterolemia   . DJD (degenerative joint disease)   . History of diabetic neuropathy   . Chest pain   . DOE (dyspnea on exertion)   . IHD (ischemic heart disease)   . SSS (sick sinus syndrome)     s/p PPM by JA for SSS and syncope 9/11  . Breast cancer   . GERD  (gastroesophageal reflux disease)   . Hemorrhoids   . CAD (coronary artery disease)     Past Surgical History  Procedure Laterality Date  . Cardiac catheterization  07/05/2009    EF 55-60%  . Insert / replace / remove pacemaker  9/11    SSS and syncope, implanted by JA (MDT)  . Mastectomy  1992    BILATERAL WITH RECONSTRUCTION  . Colonoscopy      Family History  Problem Relation Age of Onset  . Cancer Mother   . Cancer Father     History  Substance Use Topics  . Smoking status: Never Smoker   . Smokeless tobacco: Not on file  . Alcohol Use: No    OB History   Grav Para Term Preterm Abortions TAB SAB Ect Mult Living                  Review of Systems  Constitutional: Negative for fever.  Gastrointestinal: Negative for nausea, vomiting, abdominal pain and bowel incontinence.  Genitourinary: Negative for hematuria.  Neurological: Negative for tingling, loss of consciousness and headaches.  All other systems reviewed and are negative.    Allergies  Lyrica and Sulfonamide derivatives  Home Medications   Current Outpatient Rx  Name  Route  Sig  Dispense  Refill  . aspirin 81 MG tablet   Oral   Take 81 mg by mouth every morning.          Marland Kitchen  Calcium Carbonate-Vitamin D (CALCIUM + D PO)   Oral   Take 1 tablet by mouth every morning.          . carvedilol (COREG) 12.5 MG tablet   Oral   Take 1 tablet (12.5 mg total) by mouth 2 (two) times daily with a meal.   60 tablet   12   . Cyanocobalamin (VITAMIN B12 PO)   Oral   Take 1 tablet by mouth daily.          . famotidine (PEPCID) 20 MG tablet   Oral   Take 1 tablet (20 mg total) by mouth 2 (two) times daily.   60 tablet   5   . fish oil-omega-3 fatty acids 1000 MG capsule   Oral   Take 1 g by mouth every morning.          Marland Kitchen lisinopril (PRINIVIL,ZESTRIL) 10 MG tablet   Oral   Take 1 tablet (10 mg total) by mouth 2 (two) times daily.   180 tablet   3   . LORazepam (ATIVAN) 1 MG tablet    Oral   Take 0.5 mg by mouth every 8 (eight) hours as needed for anxiety. For anxiety         . metFORMIN (GLUCOPHAGE) 500 MG tablet   Oral   Take 500 mg by mouth daily with breakfast.         . nitroGLYCERIN (NITROSTAT) 0.4 MG SL tablet   Sublingual   Place 0.4 mg under the tongue every 5 (five) minutes as needed for chest pain. x3 doses as needed for chest pain         . rosuvastatin (CRESTOR) 10 MG tablet   Oral   Take 10 mg by mouth every morning.           BP 211/88  Pulse 72  Resp 22  Ht 5\' 3"  (1.6 m)  Wt 149 lb (67.586 kg)  BMI 26.4 kg/m2  SpO2 94%  Physical Exam  Nursing note and vitals reviewed. Constitutional: She is oriented to person, place, and time. She appears well-developed and well-nourished.  HENT:  Head: Normocephalic.  Right Ear: External ear normal.  Left Ear: External ear normal.  Mouth/Throat: Oropharynx is clear and moist.  Contusion above the left eye. No laceration. Extraocular movements are intact. No bony step off or crepitus is noted.  Eyes: Conjunctivae and EOM are normal. Pupils are equal, round, and reactive to light.  Neck: Normal range of motion. Neck supple.  No point tenderness is noted to cervical spine  Cardiovascular: Normal rate, regular rhythm, normal heart sounds and intact distal pulses.   Pulmonary/Chest: Effort normal and breath sounds normal.  Chest wall is significant for bilateral mastectomy status post reconstruction  Abdominal: Soft. Bowel sounds are normal.  Musculoskeletal: Normal range of motion.  Mild anterior tenderness right knee with contusion no swelling or laceration noted.  Neurological: She is alert and oriented to person, place, and time. She has normal reflexes.  Skin: Skin is warm and dry.  Psychiatric: She has a normal mood and affect. Her behavior is normal. Judgment and thought content normal.    ED Course  Procedures (including critical care time)  Labs Reviewed  CBC - Abnormal; Notable for  the following:    HCT 35.6 (*)    All other components within normal limits  BASIC METABOLIC PANEL - Abnormal; Notable for the following:    Glucose, Bld 102 (*)    GFR calc non  Af Amer 58 (*)    GFR calc Af Amer 67 (*)    All other components within normal limits   Ct Head Wo Contrast  02/10/2013  *RADIOLOGY REPORT*  Clinical Data:  Fall and trauma.  CT HEAD WITHOUT CONTRAST CT CERVICAL SPINE WITHOUT CONTRAST  Technique:  Multidetector CT imaging of the head and cervical spine was performed following the standard protocol without intravenous contrast.  Multiplanar CT image reconstructions of the cervical spine were also generated.  Comparison:   None  CT HEAD  Findings: No evidence for acute hemorrhage, mass lesion, midline shift, hydrocephalus or large infarct.  There is low density in the periventricular white matter suggesting chronic changes.  Soft tissue swelling along the left side of the forehead.  Both globes are intact.  The visualized sinuses are intact.  No evidence for a facial bone or calvarial fracture.  IMPRESSION:  No acute intracranial abnormality.  CT CERVICAL SPINE  Findings: No evidence for acute fracture or dislocation.  No evidence for an apical pneumothorax.  There is disc space narrowing at C5-C6.  Normal alignment at the cervicothoracic junction. Sub centimeter nodule in the right thyroid lobe.  IMPRESSION: Cervical spondylosis at C5-C6.  No acute bony abnormality.   Original Report Authenticated By: Richarda Overlie, M.D.    Ct Cervical Spine Wo Contrast  02/10/2013  *RADIOLOGY REPORT*  Clinical Data:  Fall and trauma.  CT HEAD WITHOUT CONTRAST CT CERVICAL SPINE WITHOUT CONTRAST  Technique:  Multidetector CT imaging of the head and cervical spine was performed following the standard protocol without intravenous contrast.  Multiplanar CT image reconstructions of the cervical spine were also generated.  Comparison:   None  CT HEAD  Findings: No evidence for acute hemorrhage, mass  lesion, midline shift, hydrocephalus or large infarct.  There is low density in the periventricular white matter suggesting chronic changes.  Soft tissue swelling along the left side of the forehead.  Both globes are intact.  The visualized sinuses are intact.  No evidence for a facial bone or calvarial fracture.  IMPRESSION:  No acute intracranial abnormality.  CT CERVICAL SPINE  Findings: No evidence for acute fracture or dislocation.  No evidence for an apical pneumothorax.  There is disc space narrowing at C5-C6.  Normal alignment at the cervicothoracic junction. Sub centimeter nodule in the right thyroid lobe.  IMPRESSION: Cervical spondylosis at C5-C6.  No acute bony abnormality.   Original Report Authenticated By: Richarda Overlie, M.D.    Dg Knee Complete 4 Views Right  02/10/2013  *RADIOLOGY REPORT*  Clinical Data: Fall  RIGHT KNEE - COMPLETE 4+ VIEW  Comparison: None.  Findings: Four views of the right knee submitted.  No acute fracture or subluxation.  Mild prepatellar soft tissue swelling. No joint effusion.  Diffuse osteopenia.  IMPRESSION: No acute fracture or subluxation.  Diffuse osteopenia.  Mild prepatellar soft tissue swelling.   Original Report Authenticated By: Natasha Mead, M.D.      No diagnosis found.  Date: 02/10/2013  Rate: 63  Rhythm: normal sinus rhythm  QRS Axis: normal  Intervals: normal  ST/T Wave abnormalities: t wave inversion II, avf, v3, nonspecific st changes lateral leads  Conduction Disutrbances:none  Narrative Interpretation:   Old EKG Reviewed: none available     MDM   1 fall versus syncope Nurse came down from floor where patient was when she fell  she states that they heard the patient fall and came into the room and she believes that the patient had  a syncopal episode.   CT scan of head and neck reveal no acute intracranial abnormality or injury to the cervical vertebrae. Right knee with contusion x-Mahitha Hickling does not show any signs of fracture  2 hypertension  Patient has been hypertensive here throughout her visit. She states that her blood pressure normally runs high in reviewing her records from Dr. Yevonne Pax office reveal that her systolic blood pressure does run around 140-150. However, her blood pressure has been systolically been about 200 here. She received Lopressor 5 mg IV with some decreased to 160 but has returned back to 200. She is receiving labetalol IV and will be placed on a drip if needed.  3 Abnormal EKG patient has ST nonspecific changes in lateral leads and T-wave inversion in 3 aVF and V3. I am unable to locate an old EKG for comparison. Her initial troponins are negative.  Patient denies chest pain. Troponin is negative.  Patient's care was discussed with Dr. Arbutus Leas and he will be in to see and evaluate patient.  CRITICAL CARE Performed by: Hilario Quarry   Total critical care time: 30  Critical care time was exclusive of separately billable procedures and treating other patients.  Critical care was necessary to treat or prevent imminent or life-threatening deterioration.  Critical care was time spent personally by me on the following activities: development of treatment plan with patient and/or surrogate as well as nursing, discussions with consultants, evaluation of patient's response to treatment, examination of patient, obtaining history from patient or surrogate, ordering and performing treatments and interventions, ordering and review of laboratory studies, ordering and review of radiographic studies, pulse oximetry and re-evaluation of patient's condition.       Hilario Quarry, MD 02/10/13 [phone]

## 2013-02-10 NOTE — Progress Notes (Signed)
Pt's b/s is 230. In the pass 24 hours, it has been closer to 100. Pt has no coverage. No number to page doctor. Will continue to monitor

## 2013-02-10 NOTE — Progress Notes (Signed)
  Pt admitted to the unit. Pt is stable, alert and oriented per baseline. Oriented to room, staff, and call bell. Educated to call for any assistance. Bed in lowest position, call bell within reach- will continue to monitor. 

## 2013-02-10 NOTE — ED Notes (Addendum)
Pt presents to ED via stretcher by [place] EMT, fully immobolized w/LSB, head blocks, and c-collar in place. Pt was a visitor on 4700, pt reports her feet got tangled up in the patients blankets causing her to trip and fall. Pt states she fell face first hitting her frontal face causing her nose to bleed and a "bump" over left eye. Pt denies LOC or N/V, but reports she did become dizzy while in transport on the stretcher. Pt is a diabetic, takes ASA 81 mg daily. Pt a/o x4.  Pt's VS on 4700 @ 11540 BP 199/73, HR 79, RR 20, 98% RA

## 2013-02-10 NOTE — ED Notes (Signed)
Dr Ray at bedside. 

## 2013-02-10 NOTE — ED Notes (Signed)
Patient transported to CT 

## 2013-02-10 NOTE — ED Notes (Signed)
CBG 95 

## 2013-02-11 ENCOUNTER — Encounter (HOSPITAL_COMMUNITY): Payer: Self-pay | Admitting: General Practice

## 2013-02-11 DIAGNOSIS — I495 Sick sinus syndrome: Secondary | ICD-10-CM | POA: Diagnosis not present

## 2013-02-11 DIAGNOSIS — I2510 Atherosclerotic heart disease of native coronary artery without angina pectoris: Secondary | ICD-10-CM | POA: Diagnosis not present

## 2013-02-11 DIAGNOSIS — I5189 Other ill-defined heart diseases: Secondary | ICD-10-CM | POA: Diagnosis present

## 2013-02-11 DIAGNOSIS — I10 Essential (primary) hypertension: Secondary | ICD-10-CM | POA: Diagnosis not present

## 2013-02-11 DIAGNOSIS — I059 Rheumatic mitral valve disease, unspecified: Secondary | ICD-10-CM

## 2013-02-11 LAB — BASIC METABOLIC PANEL
CO2: 24 mEq/L (ref 19–32)
Glucose, Bld: 116 mg/dL — ABNORMAL HIGH (ref 70–99)
Potassium: 3.3 mEq/L — ABNORMAL LOW (ref 3.5–5.1)
Sodium: 140 mEq/L (ref 135–145)

## 2013-02-11 LAB — TROPONIN I: Troponin I: <0.3 ng/mL (ref <0.30)

## 2013-02-11 LAB — RAPID URINE DRUG SCREEN, HOSP PERFORMED
Amphetamines: NOT DETECTED
Benzodiazepines: NOT DETECTED
Opiates: NOT DETECTED

## 2013-02-11 LAB — CBC
Hemoglobin: 12 g/dL (ref 12.0–15.0)
MCH: 29.8 pg (ref 26.0–34.0)
MCV: 83.1 fL (ref 78.0–100.0)
RBC: 4.03 MIL/uL (ref 3.87–5.11)

## 2013-02-11 LAB — GLUCOSE, CAPILLARY: Glucose-Capillary: 153 mg/dL — ABNORMAL HIGH (ref 70–99)

## 2013-02-11 MED ORDER — LISINOPRIL 10 MG PO TABS: 20.0000 mg | ORAL_TABLET | Freq: Two times a day (BID) | ORAL | Status: DC

## 2013-02-11 MED ORDER — POTASSIUM CHLORIDE CRYS ER 20 MEQ PO TBCR
40.0000 meq | EXTENDED_RELEASE_TABLET | Freq: Once | ORAL | Status: AC
  Administered 2013-02-11: 40 meq via ORAL
  Filled 2013-02-11: qty 2

## 2013-02-11 NOTE — Progress Notes (Signed)
RN has been watching pt's b/p- seems that it is still high but not as high as it was in the ED. Pt SBP has stayed below 165 all night.

## 2013-02-11 NOTE — Care Management Note (Addendum)
    Page 1 of 1   02/11/2013     3:08:53 PM   CARE MANAGEMENT NOTE 02/11/2013  Patient:  Jessica [name], Jessica [name]   Account Number:  [id number]  Date Initiated:  02/11/2013  Documentation initiated by:  Letha Cape  Subjective/Objective Assessment:   dx sick sinus syndrome  admit- lives with daughter     Action/Plan:   pt eval- no pt needs   Anticipated DC Date:  02/11/2013   Anticipated DC Plan:  HOME/SELF CARE      DC Planning Services  CM consult      Choice offered to / List presented to:             Status of service:  Completed, signed off Medicare Important Message given?   (If response is "NO", the following Medicare IM given date fields will be blank) Date Medicare IM given:   Date Additional Medicare IM given:    Discharge Disposition:  HOME/SELF CARE  Per UR Regulation:  Reviewed for med. necessity/level of care/duration of stay  If discussed at Long Length of Stay Meetings, dates discussed:    Comments:  02/11/13 14:49 Letha Cape RN, BSN [phone] patient lives with daughter, await pt eval.  Per physical therapy, pt has not pt needs.

## 2013-02-11 NOTE — Evaluation (Signed)
Physical Therapy Evaluation Patient Details Name: Jessica RULAND MRN: [id number] DOB: January 03, 1927 Today's Date: 02/11/2013 Time: 4098-1191 PT Time Calculation (min): 19 min  PT Assessment / Plan / Recommendation Clinical Impression  Patient is an 79 y/o female who fell in the hospital striking her head.  Does have BP drop sitting to standing only 8 mmHg systolic and no symptoms.  Does have decreased foot clearance and decreased single limb stance time, but likely [facility] for her age.  Did educate on outpatient PT option if feels balance is an issue.  Currently per dynamic gait index pt at low risk for falls in community scored 21/24 (scores less than 19 predict falls in older community living adults.)  Agree with d/c home and no current follow up PT needs.    PT Assessment  Patent does not need any further PT services    Follow Up Recommendations  No PT follow up          Equipment Recommendations  None recommended by PT          Precautions / Restrictions Precautions Precautions: None   Pertinent Vitals/Pain Denies pain      Mobility  Bed Mobility Bed Mobility: Supine to Sit Supine to Sit: 6: Modified independent (Device/Increase time);HOB elevated Transfers Transfers: Sit to Stand;Stand to Sit Sit to Stand: 6: Modified independent (Device/Increase time);From bed Stand to Sit: 6: Modified independent (Device/Increase time);To bed Ambulation/Gait Ambulation/Gait Assistance: 7: Independent Ambulation Distance (Feet): 250 Feet Assistive device: None Ambulation/Gait Assistance Details: decreased foot clearance bilateral, right foot/hip external rotation Gait Pattern: Step-through pattern;Decreased stride length;Decreased hip/knee flexion - right;Decreased hip/knee flexion - left Gait velocity: not measured, but at least 1.8 ft/sec Stairs: Yes Stairs Assistance: 6: Modified independent (Device/Increase time) Stair Management Technique: One rail Right;Alternating pattern Number  of Stairs: 3    Education   Educated on fall prevention in the home with good understanding.    Visit Information  Last PT Received On: 02/11/13 Assistance Needed: +1    Subjective Data  Subjective: I think I just fell over that blanket. Patient Stated Goal: To go home today   Prior Functioning  Home Living Lives With: Daughter Available Help at Discharge: Family;Available PRN/intermittently Type of Home: House Home Access: Level entry Home Layout: One level Bathroom Shower/Tub: Walk-in shower;Curtain Home Adaptive Equipment: Straight cane;Built-in shower seat Prior Function Level of Independence: Needs assistance Needs Assistance: Light Housekeeping;Meal Prep Meal Prep: Maximal Light Housekeeping: Maximal Driving: Yes Vocation: Retired Musician: Surveyor, mining Overall Cognitive Status: Appears within functional limits for tasks assessed/performed Arousal/Alertness: Awake/alert Orientation Level: Appears intact for tasks assessed Behavior During Session: [facility] for tasks performed    Extremity/Trunk Assessment Right Lower Extremity Assessment RLE ROM/Strength/Tone: Within functional levels RLE Sensation: History of peripheral neuropathy Left Lower Extremity Assessment LLE ROM/Strength/Tone: Within functional levels LLE Sensation: History of peripheral neuropathy   Balance Standardized Balance Assessment Standardized Balance Assessment: Dynamic Gait Index Dynamic Gait Index Level Surface: Mild Impairment Change in Gait Speed: Normal Gait with Horizontal Head Turns: Mild Impairment Gait with Vertical Head Turns: Normal Gait and Pivot Turn: Normal Step Over Obstacle: Normal Step Around Obstacles: Normal Steps: Mild Impairment Total Score: 21  End of Session PT - End of Session Activity Tolerance: Patient tolerated treatment well Patient left: in bed;with call bell/phone within reach  GP Functional Assessment Tool Used: Dynamic  Gait Index Functional Limitation: Mobility: Walking and moving around Mobility: Walking and Moving Around Current Status ([id number]): At least  1 percent but less than 20 percent impaired, limited or restricted Mobility: Walking and Moving Around Goal Status [phone]): At least 1 percent but less than 20 percent impaired, limited or restricted Mobility: Walking and Moving Around Discharge Status [phone]): At least 1 percent but less than 20 percent impaired, limited or restricted   [facility] 02/11/2013, 3:19 PM Sheran Lawless, PT [phone] 02/11/2013

## 2013-02-11 NOTE — Discharge Summary (Signed)
Physician Discharge Summary  HAILLIE RADU JXB:[id number] DOB: [birth year] DOA: 02/10/2013  PCP: [name], MD  Admit date: 02/10/2013 Discharge date: 02/11/2013  Time spent: 45  minutes  Recommendations for Outpatient Follow-up:  Appointment scheduled with Dr. Patty Sermons on 4/22 at 2:30. Please monitor blood pressure.  Patient's BP was elevated in the hospital. Lisinopril was increased. Patient sustained a nondisplaced fracture to the 5th digit on her left hand.  Follow up with Dr. Rayburn Ma (ortho) if needed. Echo cardiogram showed grade 2 diastolic dysfunction.  Results below  Discharge Diagnoses:  Active Problems:  Mechanical fall Uncontrolled hypertension   DM   HYPERCHOLESTEROLEMIA   CAD   DEGENERATIVE JOINT DISEASE   Pacemaker-[employer]   Hypertension   SSS (sick sinus syndrome)   Diastolic dysfunction   Discharge Condition: stable  Diet recommendation: Heart Healthy, carb modified.  Filed Weights   02/10/13 1229 02/10/13 2114  Weight: 67.586 kg (149 lb) 67.7 kg (149 lb 4 oz)    History of present illness:  79 year old female medical history of coronary artery disease status post DES x 2 sept 2011, SSS, HTN, HLD was here at [facility] visiting her friend.  When the patient  tripped over a blanket and fell onto her face.  She hit forehead and left side of the head. The patient denies any syncope adamantly.  However, the nursing staff that witnessed the event had concerns that the patient actually had syncope. The patient was placed on a stretcher and brought to the emergency department. The patient has been her usual state of health without any major complaints. She denies any recent fevers, chills, chest discomfort, shortness of breath, nausea, vomiting, diarrhea, headaches, dizziness, focal extremity weakness. During this episode, the patient did not have any prodromal symptoms whatsoever. She did not have any urinary or bowel incontinence. She did not bite  her tongue. Of note, the patient has history of syncope which was thought to be due to her sick sinus syndrome. The patient had a pacemaker placed in 2012 after which the patient had no more syncopal episodes. She recently had her pacemaker interrogated by Dr. Johney Frame on 01/13/13, and it was normally functioning.  In emergency department, the patient was found to be extremely hypertensive with a systolic blood pressure of 211/98. The patient was given Lopressor IV 5 mg. There was no improvement. The patient was only given labetalol 20 mg IV x1. Her blood pressure was 196/80 during my interview. EKG showed T wave inversions in II,III, aVF as well as V3, V4.troponins are pending at the time of my interview. The patient is totally asymptomatic without any chest discomfort or shortness of breath. In fact,the patient wants to go home.BMP and CBC were essentially unremarkable.CT brain was unremarkable as well as CT of the cervical spine. The CT cervical spine showed only spondylosis.   Hospital Course:  Mechanical fall with concerns of syncope  Patient clearly remembers the fall and her landing.  Doubt syncope.  She was not orthostatic.  She received IV fluid and was monitored overnight.  Echo cardiogram was completed but resulted were still pending at the time of discharge.  Troponins X 3, total ck and TSH are all with in normal limits. Further workup if at all indicated can be done in the outpatient setting.  Abnormal EKG  Although T wave inversions were seen in the EKG, cardiac enzymes were negative. Patient indicated that she had followup with Dr. Patty Sermons her primary cardiologist-since she was chest pain-free on ambulation-it  was felt that further workup could be pursued in the outpatient setting. Echocardiogram did not show any wall motion abnormality as well.  Hypertensive urgency  The patient experienced hypertensive urgency (211/98) as a result of her fall.  However her blood pressure was still  significantly elevated at the time of discharge. 174/78.  He lisinopril has been increased to 20 mg bid.  We request that she follow with Dr. Patty Sermons in the next week.  Diabetes mellitus type 2  Hgb A1C is 5.8 indicating good control.  Will discharge on prior home regimen.  Hyperlipidemia  Continue statin  Nondisplaced fracture of the Left upper extremity 5th digit. Per Dr. Magnus Ivan will be painful for 3-6 weeks but should heal with out treatment. Ms. Cratty may follow up with him in his office if her pain becomes severe.   Procedures:  2D echocardiogram Study Conclusions  - Left ventricle: The cavity size was normal. Wall thickness was increased in a pattern of mild LVH. The estimated ejection fraction was 55%. Wall motion was normal; there were no regional wall motion abnormalities. Features are consistent with a pseudonormal left ventricular filling pattern, with concomitant abnormal relaxation and increased filling pressure (grade 2 diastolic dysfunction). E/medial e' > 15 suggests LV end diastolic pressure at least 20 mmHg. - Aortic valve: Trileaflet; moderately calcified leaflets. Sclerosis without stenosis. - Mitral valve: Mildly calcified annulus. Mildly calcified leaflets . Mild regurgitation. - Left atrium: The atrium was mildly dilated. - Right ventricle: The cavity size was normal. Pacer wire or catheter noted in right ventricle. Systolic function was normal. - Tricuspid valve: Peak RV-RA gradient: 48mm Hg (S). - Pulmonary arteries: PA peak pressure: 58mm Hg (S). - Systemic veins: IVC measured 2.1 cm with some respirophasic variation, suggesting RA pressure 10 mmHg.  Impressions:  - Normal LV size with mild LV hypertrophy. EF 55%. Moderate diastolic dysfunction with evidence for elevated LV filling pressure. Normal RV size and systoilc function . Mild MR. Moderate pulmonary hypertension.     Consultations:  Curb sided Dr. Magnus Ivan of orthopedic surgery via  telephone to look at hand xray.  Spoke with Dr. Cay Schillings agreed  with above  Discharge Exam: Filed Vitals:   02/11/13 0500 02/11/13 0855 02/11/13 1502 02/11/13 1504  BP: 155/70 156/76 181/90 174/78  Pulse: 65 78 81 80  Temp: 98 F (36.7 C)  98.4 F (36.9 C)   TempSrc: Oral  Oral   Resp:   20   Height:      Weight:      SpO2: 94%  96%     General: WD, WN 79 yo female, NAD HEENT:  Ecchymosis around left eye Cardiovascular: rrr no m/r/g Respiratory: CTA no w/c/r Extremities:  Bruising on dorsum of left hand near the base of the 5th digit  Discharge Instructions      Discharge Orders   Future Appointments Provider Department Dept Phone   02/22/2013 2:30 PM [name], MD  [facility] [place]) [phone]   Future Orders Complete By Expires     Diet - low sodium heart healthy  As directed     Increase activity slowly  As directed         Medication List    TAKE these medications       aspirin 81 MG tablet  Take 81 mg by mouth every morning.     CALCIUM + D PO  Take 1 tablet by mouth every morning.     carvedilol 12.5 MG tablet  Commonly  known as:  COREG  Take 1 tablet (12.5 mg total) by mouth 2 (two) times daily with a meal.     famotidine 20 MG tablet  Commonly known as:  PEPCID  Take 1 tablet (20 mg total) by mouth 2 (two) times daily.     fish oil-omega-3 fatty acids 1000 MG capsule  Take 1 g by mouth every morning.     lisinopril 10 MG tablet  Commonly known as:  PRINIVIL,ZESTRIL  Take 2 tablets (20 mg total) by mouth 2 (two) times daily.     LORazepam 1 MG tablet  Commonly known as:  ATIVAN  Take 0.5 mg by mouth every 8 (eight) hours as needed for anxiety. For anxiety     metFORMIN 500 MG tablet  Commonly known as:  GLUCOPHAGE  Take 500 mg by mouth daily with breakfast.     nitroGLYCERIN 0.4 MG SL tablet  Commonly known as:  NITROSTAT  Place 0.4 mg under the tongue every 5 (five) minutes as needed for chest pain. x3  doses as needed for chest pain     rosuvastatin 10 MG tablet  Commonly known as:  CRESTOR  Take 10 mg by mouth every morning.     VITAMIN B12 PO  Take 1 tablet by mouth daily.       Follow-up Information   Follow up On 02/22/2013. (2:30 pm with Dr. Patty Sermons)       Follow up with [name], MD.   Contact information:   [address] [place] [address] [phone]        The results of significant diagnostics from this hospitalization (including imaging, microbiology, ancillary and laboratory) are listed below for reference.    Significant Diagnostic Studies: Ct Head Wo Contrast  02/10/2013  *RADIOLOGY REPORT*  Clinical Data:  Fall and trauma.  CT HEAD WITHOUT CONTRAST CT CERVICAL SPINE WITHOUT CONTRAST  Technique:  Multidetector CT imaging of the head and cervical spine was performed following the standard protocol without intravenous contrast.  Multiplanar CT image reconstructions of the cervical spine were also generated.  Comparison:   None  CT HEAD  Findings: No evidence for acute hemorrhage, mass lesion, midline shift, hydrocephalus or large infarct.  There is low density in the periventricular white matter suggesting chronic changes.  Soft tissue swelling along the left side of the forehead.  Both globes are intact.  The visualized sinuses are intact.  No evidence for a facial bone or calvarial fracture.  IMPRESSION:  No acute intracranial abnormality.  CT CERVICAL SPINE  Findings: No evidence for acute fracture or dislocation.  No evidence for an apical pneumothorax.  There is disc space narrowing at C5-C6.  Normal alignment at the cervicothoracic junction. Sub centimeter nodule in the right thyroid lobe.  IMPRESSION: Cervical spondylosis at C5-C6.  No acute bony abnormality.   Original Report Authenticated By: Richarda Overlie, M.D.    Ct Cervical Spine Wo Contrast  02/10/2013  *RADIOLOGY REPORT*  Clinical Data:  Fall and trauma.  CT HEAD WITHOUT CONTRAST CT  CERVICAL SPINE WITHOUT CONTRAST  Technique:  Multidetector CT imaging of the head and cervical spine was performed following the standard protocol without intravenous contrast.  Multiplanar CT image reconstructions of the cervical spine were also generated.  Comparison:   None  CT HEAD  Findings: No evidence for acute hemorrhage, mass lesion, midline shift, hydrocephalus or large infarct.  There is low density in the periventricular white matter suggesting chronic changes.  Soft tissue  swelling along the left side of the forehead.  Both globes are intact.  The visualized sinuses are intact.  No evidence for a facial bone or calvarial fracture.  IMPRESSION:  No acute intracranial abnormality.  CT CERVICAL SPINE  Findings: No evidence for acute fracture or dislocation.  No evidence for an apical pneumothorax.  There is disc space narrowing at C5-C6.  Normal alignment at the cervicothoracic junction. Sub centimeter nodule in the right thyroid lobe.  IMPRESSION: Cervical spondylosis at C5-C6.  No acute bony abnormality.   Original Report Authenticated By: Richarda Overlie, M.D.    Dg Knee Complete 4 Views Right  02/10/2013  *RADIOLOGY REPORT*  Clinical Data: Fall  RIGHT KNEE - COMPLETE 4+ VIEW  Comparison: None.  Findings: Four views of the right knee submitted.  No acute fracture or subluxation.  Mild prepatellar soft tissue swelling. No joint effusion.  Diffuse osteopenia.  IMPRESSION: No acute fracture or subluxation.  Diffuse osteopenia.  Mild prepatellar soft tissue swelling.   Original Report Authenticated By: Natasha Mead, M.D.    Dg Hand Complete Left  02/10/2013  *RADIOLOGY REPORT*  Clinical Data: Fall, pain of the fifth metacarpal phalangeal joint  LEFT HAND - COMPLETE 3+ VIEW  Comparison: None.  Findings: Bones are osteopenic, which may mask subtle fracture.  On the oblique view, there is suggestion of cortical discontinuity at the base of the fifth metacarpal.  No dislocation.  No radiopaque foreign body.   IMPRESSION: There is cortical discontinuity at the base of the fifth metacarpal on one view only, which may indicate nondisplaced fracture. Correlate for point tenderness over this area and consider repeat imaging if symptoms persist and/or correlate.   Original Report Authenticated By: Christiana Pellant, M.D.     Labs: Basic Metabolic Panel:  Recent Labs Lab 02/10/13 1300 02/11/13 0231  NA 141 140  K 3.9 3.3*  CL 109 107  CO2 25 24  GLUCOSE 102* 116*  BUN 17 14  CREATININE 0.90 0.85  CALCIUM 9.6 9.2  CBC:  Recent Labs Lab 02/10/13 1300 02/11/13 0231  WBC 9.0 9.1  HGB 12.5 12.0  HCT 35.6* 33.5*  MCV 85.0 83.1  PLT 244 228   Cardiac Enzymes:  Recent Labs Lab 02/10/13 2117 02/11/13 0231 02/11/13 0908  CKTOTAL 55  --   --   TROPONINI <0.30 <0.30 <0.30   CBG:  Recent Labs Lab 02/10/13 2312 02/11/13 0804 02/11/13 1126  GLUCAP 230* 111* 153*     Signed:  Conley Canal [phone]  [facility] 02/11/2013, 4:17 PM  Attending Patient seen and examined, agree with the assessment and plan. Anxious to go home today-admitted after a mechanical fall-patient denies syncope-tele-did not show major events overnight. Enzymes negative. BP better controlled-but would increase Lisinopril to 20 mg. She has a follow up with Dr Patty Sermons in less than 2 weeks. She was evaluated by PT-no chest pain on ambulation.  She is stable for discharge  S Laree Garron

## 2013-02-11 NOTE — Progress Notes (Signed)
  Echocardiogram 2D Echocardiogram has been performed.  Jessica [name] A 02/11/2013, 11:13 AM

## 2013-02-16 LAB — GLUCOSE, CAPILLARY: Glucose-Capillary: 95 mg/dL (ref 70–99)

## 2013-02-22 ENCOUNTER — Ambulatory Visit (INDEPENDENT_AMBULATORY_CARE_PROVIDER_SITE_OTHER): Payer: Medicare Other | Admitting: Cardiology

## 2013-02-22 ENCOUNTER — Encounter: Payer: Self-pay | Admitting: Cardiology

## 2013-02-22 VITALS — BP 150/76 | HR 86 | Ht 64.0 in | Wt 153.2 lb

## 2013-02-22 DIAGNOSIS — E119 Type 2 diabetes mellitus without complications: Secondary | ICD-10-CM

## 2013-02-22 DIAGNOSIS — I119 Hypertensive heart disease without heart failure: Secondary | ICD-10-CM | POA: Diagnosis not present

## 2013-02-22 DIAGNOSIS — Z950 Presence of cardiac pacemaker: Secondary | ICD-10-CM

## 2013-02-22 NOTE — Assessment & Plan Note (Signed)
The patient has not been aware of any tachycardia or atrial fibrillation

## 2013-02-22 NOTE — Patient Instructions (Addendum)
Start monitoring your blood pressures at home and call if your systolic (top number) is greater than 150  Your physician recommends that you continue on your current medications as directed. Please refer to the Current Medication list given to you today.  Your physician recommends that you schedule a follow-up appointment in: 4 months with fasting labs (lp/bmet/hfp)

## 2013-02-22 NOTE — Progress Notes (Signed)
Jessica [name] Date of Birth:  December 01, 1926 [facility] [address] [place], [place]  [address] [phone]         Fax   [phone]  History of Present Illness: This pleasant 79 year old woman is seen for a four-month followup office visit. She has a complex past medical history. She has a history of sick sinus syndrome and has a permanent pacemaker. She has had no further episodes of syncope since the pacemaker implantation. She has a history of ischemic heart disease and underwent insertion of 2 drug-eluting stents placed in the mid and distal right coronary artery in September 2000. She has a history of diabetes, essential hypertension, and high cholesterol. An echocardiogram in September 2011 showed normal ejection fraction of 60-65% with diastolic dysfunction.  She was recently hospitalized after a mechanical fall in which she injured her right knee and suffered a slight fracture of the fifth finger of the left hand.   Current Outpatient Prescriptions  Medication Sig Dispense Refill  . aspirin 81 MG tablet Take 81 mg by mouth every morning.       . Calcium Carbonate-Vitamin D (CALCIUM + D PO) Take 1 tablet by mouth every morning.       . carvedilol (COREG) 12.5 MG tablet Take 1 tablet (12.5 mg total) by mouth 2 (two) times daily with a meal.  60 tablet  12  . Cyanocobalamin (VITAMIN B12 PO) Take 1 tablet by mouth daily.       . famotidine (PEPCID) 20 MG tablet Take 1 tablet (20 mg total) by mouth 2 (two) times daily.  60 tablet  5  . fish oil-omega-3 fatty acids 1000 MG capsule Take 1 g by mouth every morning.       Marland Kitchen LORazepam (ATIVAN) 1 MG tablet Take 0.5 mg by mouth every 8 (eight) hours as needed for anxiety. For anxiety      . metFORMIN (GLUCOPHAGE) 500 MG tablet Take 500 mg by mouth daily with breakfast.      . nitroGLYCERIN (NITROSTAT) 0.4 MG SL tablet Place 0.4 mg under the tongue every 5 (five) minutes as needed for chest pain. x3 doses as needed for  chest pain      . rosuvastatin (CRESTOR) 10 MG tablet Take 10 mg by mouth every morning.      Marland Kitchen lisinopril (PRINIVIL,ZESTRIL) 10 MG tablet Take 2 tablets (20 mg total) by mouth 2 (two) times daily.  180 tablet  3   No current facility-administered medications for this visit.    Allergies  Allergen Reactions  . Lyrica (Pregabalin)     wgt gain  . Sulfonamide Derivatives Rash    Patient Active Problem List  Diagnosis  . DM  . HYPERCHOLESTEROLEMIA  . CAD  . SICK SINUS SYNDROME  . DEGENERATIVE JOINT DISEASE  . Benign hypertensive heart disease without heart failure  . Pacemaker-Medtronic  . Hypertension  . SSS (sick sinus syndrome)  . Diastolic dysfunction    History  Smoking status  . Never Smoker   Smokeless tobacco  . Not on file    History  Alcohol Use No    Family History  Problem Relation Age of Onset  . Cancer Mother   . Cancer Father     Review of Systems: Constitutional: no fever chills diaphoresis or fatigue or change in weight.  Head and neck: no hearing loss, no epistaxis, no photophobia or visual disturbance. Respiratory: No cough, shortness of breath or wheezing. Cardiovascular: No chest pain peripheral  edema, palpitations. Gastrointestinal: No abdominal distention, no abdominal pain, no change in bowel habits hematochezia or melena. Genitourinary: No dysuria, no frequency, no urgency, no nocturia. Musculoskeletal:No arthralgias, no back pain, no gait disturbance or myalgias. Neurological: No dizziness, no headaches, no numbness, no seizures, no syncope, no weakness, no tremors. Hematologic: No lymphadenopathy, no easy bruising. Psychiatric: No confusion, no hallucinations, no sleep disturbance.    Physical Exam: Filed Vitals:   02/22/13 1435  BP: 150/76  Pulse: 86   the general appearance reveals a well-developed well-nourished elderly female in no distress.  She is somewhat hard of hearing.The head and neck exam reveals pupils equal and  reactive.  Extraocular movements are full.  There is no scleral icterus.  The mouth and pharynx are normal.  The neck is supple.  The carotids reveal no bruits.  The jugular venous pressure is normal.  The  thyroid is not enlarged.  There is no lymphadenopathy.  The chest is clear to percussion and auscultation.  There are no rales or rhonchi.  Expansion of the chest is symmetrical.  The precordium is quiet.  The first heart sound is normal.  The second heart sound is physiologically split.  There is no murmur gallop rub or click.  There is no abnormal lift or heave.  The abdomen is soft and nontender.  The bowel sounds are normal.  The liver and spleen are not enlarged.  There are no abdominal masses.  There are no abdominal bruits.  Extremities reveal good pedal pulses.  There is no phlebitis or edema.  There is no cyanosis or clubbing.  Strength is normal and symmetrical in all extremities.  There is no lateralizing weakness.  There are no sensory deficits.  The skin is warm and dry.  There is no rash.     Assessment / Plan: Continue same medication.  Recheck in 4 months for followup office visit lipid panel hepatic function panel and basal metabolic panel

## 2013-02-22 NOTE — Assessment & Plan Note (Signed)
The patient denies any hypoglycemic episodes.  She states it her blood sugars are well controlled at home.

## 2013-02-22 NOTE — Assessment & Plan Note (Signed)
Blood pressure today is slightly high.  She blames it on being at the doctor's office.  Want her to start checking her blood pressures at home and she is to call us if the blood pressure is running more than 150 systolic.  She is not having any symptoms from her high blood pressure.  No symptoms of CHF, headaches dizziness or syncope

## 2013-03-15 ENCOUNTER — Other Ambulatory Visit: Payer: Self-pay | Admitting: *Deleted

## 2013-03-15 DIAGNOSIS — I119 Hypertensive heart disease without heart failure: Secondary | ICD-10-CM

## 2013-03-15 DIAGNOSIS — I2510 Atherosclerotic heart disease of native coronary artery without angina pectoris: Secondary | ICD-10-CM

## 2013-03-15 MED ORDER — LISINOPRIL 10 MG PO TABS: 20.0000 mg | ORAL_TABLET | Freq: Two times a day (BID) | ORAL | Status: DC

## 2013-03-15 NOTE — Telephone Encounter (Signed)
Fax Received. Refill Completed. Julena Barbour Chowoe (R.M.A)   

## 2013-03-25 ENCOUNTER — Other Ambulatory Visit: Payer: Self-pay | Admitting: *Deleted

## 2013-03-25 DIAGNOSIS — F419 Anxiety disorder, unspecified: Secondary | ICD-10-CM

## 2013-03-27 MED ORDER — LORAZEPAM 1 MG PO TABS: 0.5000 mg | ORAL_TABLET | Freq: Three times a day (TID) | ORAL | Status: DC | PRN

## 2013-07-06 ENCOUNTER — Ambulatory Visit (INDEPENDENT_AMBULATORY_CARE_PROVIDER_SITE_OTHER): Payer: Medicare Other | Admitting: Cardiology

## 2013-07-06 ENCOUNTER — Encounter: Payer: Self-pay | Admitting: Cardiology

## 2013-07-06 VITALS — BP 138/80 | HR 72 | Ht 64.0 in | Wt 149.0 lb

## 2013-07-06 DIAGNOSIS — I495 Sick sinus syndrome: Secondary | ICD-10-CM

## 2013-07-06 DIAGNOSIS — E7800 Pure hypercholesterolemia, unspecified: Secondary | ICD-10-CM | POA: Diagnosis not present

## 2013-07-06 DIAGNOSIS — E119 Type 2 diabetes mellitus without complications: Secondary | ICD-10-CM

## 2013-07-06 DIAGNOSIS — E1149 Type 2 diabetes mellitus with other diabetic neurological complication: Secondary | ICD-10-CM | POA: Diagnosis not present

## 2013-07-06 DIAGNOSIS — I119 Hypertensive heart disease without heart failure: Secondary | ICD-10-CM

## 2013-07-06 DIAGNOSIS — E1140 Type 2 diabetes mellitus with diabetic neuropathy, unspecified: Secondary | ICD-10-CM

## 2013-07-06 DIAGNOSIS — E1142 Type 2 diabetes mellitus with diabetic polyneuropathy: Secondary | ICD-10-CM | POA: Diagnosis not present

## 2013-07-06 DIAGNOSIS — IMO0001 Reserved for inherently not codable concepts without codable children: Secondary | ICD-10-CM

## 2013-07-06 LAB — BASIC METABOLIC PANEL
BUN: 20 mg/dL (ref 6–23)
CO2: 26 mEq/L (ref 19–32)
Calcium: 9.4 mg/dL (ref 8.4–10.5)
Creatinine, Ser: 1.1 mg/dL (ref 0.4–1.2)
Glucose, Bld: 112 mg/dL — ABNORMAL HIGH (ref 70–99)

## 2013-07-06 LAB — HEPATIC FUNCTION PANEL
ALT: 14 U/L (ref 0–35)
AST: 16 U/L (ref 0–37)
Alkaline Phosphatase: 55 U/L (ref 39–117)
Total Bilirubin: 0.7 mg/dL (ref 0.3–1.2)

## 2013-07-06 LAB — LIPID PANEL: Total CHOL/HDL Ratio: 4

## 2013-07-06 LAB — HEMOGLOBIN A1C: Hgb A1c MFr Bld: 6.2 % (ref 4.6–6.5)

## 2013-07-06 MED ORDER — GABAPENTIN 300 MG PO CAPS: 300.0000 mg | ORAL_CAPSULE | Freq: Three times a day (TID) | ORAL | Status: DC

## 2013-07-06 MED ORDER — GABAPENTIN 300 MG PO CAPS: 300.0000 mg | ORAL_CAPSULE | Freq: Every day | ORAL | Status: DC

## 2013-07-06 NOTE — Assessment & Plan Note (Signed)
Patient has a functioning pacemaker.  She has not been aware of any racing of her heart or tachycardia.

## 2013-07-06 NOTE — Assessment & Plan Note (Signed)
The patient has a history of hypercholesterolemia.  For some reason she has not been taking her Crestor since May.  She apparently forgot about it.  She did not realize it was missing from her collection of pill bottles.  We are checking lab work today and this will reflect her levels without statin therapy

## 2013-07-06 NOTE — Progress Notes (Signed)
Quick Note:  Please report to patient. The recent labs are stable. Continue same medication and careful diet. Cholesterol too high so resume Crestor 10 mg daily ______

## 2013-07-06 NOTE — Assessment & Plan Note (Signed)
The patient has significant discomfort in her feet from her diabetic neuropathy.  We will give her a trial of gabapentin 300 mg at at bedtime.

## 2013-07-06 NOTE — Progress Notes (Signed)
Jessica [name] Date of Birth:  01-12-1927 [facility] [address] [place], [place]  [address] [phone]         Fax   [phone]  History of Present Illness: This pleasant 79 year old woman is seen for a four-month followup office visit. She has a complex past medical history. She has a history of sick sinus syndrome and has a permanent pacemaker. She has had no further episodes of syncope since the pacemaker implantation. She has a history of ischemic heart disease and underwent insertion of 2 drug-eluting stents placed in the mid and distal right coronary artery in September 2000. She has a history of diabetes, essential hypertension, and high cholesterol. An echocardiogram in September 2011 showed normal ejection fraction of 60-65% with diastolic dysfunction. She was recently hospitalized after a mechanical fall in which she injured her right knee and suffered a slight fracture of the fifth finger of the left hand.   Current Outpatient Prescriptions  Medication Sig Dispense Refill  . aspirin 81 MG tablet Take 81 mg by mouth every morning.       . Calcium Carbonate-Vitamin D (CALCIUM + D PO) Take 1 tablet by mouth every morning.       . carvedilol (COREG) 12.5 MG tablet Take 1 tablet (12.5 mg total) by mouth 2 (two) times daily with a meal.  60 tablet  12  . Cyanocobalamin (VITAMIN B12 PO) Take 1 tablet by mouth daily.       . famotidine (PEPCID) 20 MG tablet Take 1 tablet (20 mg total) by mouth 2 (two) times daily.  60 tablet  5  . fish oil-omega-3 fatty acids 1000 MG capsule Take 1 g by mouth every morning.       Marland Kitchen lisinopril (PRINIVIL,ZESTRIL) 10 MG tablet Take 2 tablets (20 mg total) by mouth 2 (two) times daily.  180 tablet  3  . LORazepam (ATIVAN) 1 MG tablet Take 0.5 tablets (0.5 mg total) by mouth every 8 (eight) hours as needed for anxiety. For anxiety  100 tablet  1  . metFORMIN (GLUCOPHAGE) 500 MG tablet Take 500 mg by mouth daily with breakfast.        . nitroGLYCERIN (NITROSTAT) 0.4 MG SL tablet Place 0.4 mg under the tongue every 5 (five) minutes as needed for chest pain. x3 doses as needed for chest pain      . gabapentin (NEURONTIN) 300 MG capsule Take 1 capsule (300 mg total) by mouth daily.  30 capsule  prn  . rosuvastatin (CRESTOR) 10 MG tablet Take 10 mg by mouth every morning.       No current facility-administered medications for this visit.    Allergies  Allergen Reactions  . Lyrica [Pregabalin]     wgt gain  . Sulfonamide Derivatives Rash    Patient Active Problem List   Diagnosis Date Noted  . HYPERCHOLESTEROLEMIA 10/22/2010    Priority: High  . Diabetic neuropathy 07/06/2013  . Diastolic dysfunction 02/11/2013  . SSS (sick sinus syndrome) 02/10/2013  . Hypertension 07/30/2012  . Pacemaker-Medtronic 07/19/2012  . Benign hypertensive heart disease without heart failure 01/28/2012  . DM 10/22/2010  . CAD 10/22/2010  . SICK SINUS SYNDROME 10/22/2010  . DEGENERATIVE JOINT DISEASE 10/22/2010    History  Smoking status  . Never Smoker   Smokeless tobacco  . Not on file    History  Alcohol Use No    Family History  Problem Relation Age of Onset  . Cancer Mother   .  Cancer Father     Review of Systems: Constitutional: no fever chills diaphoresis or fatigue or change in weight.  Head and neck: no hearing loss, no epistaxis, no photophobia or visual disturbance. Respiratory: No cough, shortness of breath or wheezing. Cardiovascular: No chest pain peripheral edema, palpitations. Gastrointestinal: No abdominal distention, no abdominal pain, no change in bowel habits hematochezia or melena. Genitourinary: No dysuria, no frequency, no urgency, no nocturia. Musculoskeletal:No arthralgias, no back pain, no gait disturbance or myalgias. Neurological: No dizziness, no headaches, no numbness, no seizures, no syncope, no weakness, no tremors. Hematologic: No lymphadenopathy, no easy bruising. Psychiatric: No  confusion, no hallucinations, no sleep disturbance.    Physical Exam: Filed Vitals:   07/06/13 0908  BP: 138/80  Pulse: 72   the general appearance is that of an elderly woman in no distress.The head and neck exam reveals pupils equal and reactive.  Extraocular movements are full.  There is no scleral icterus.  The mouth and pharynx are normal.  The neck is supple.  The carotids reveal no bruits.  The jugular venous pressure is normal.  The  thyroid is not enlarged.  There is no lymphadenopathy.  The chest is clear to percussion and auscultation.  There are no rales or rhonchi.  Expansion of the chest is symmetrical.  The precordium is quiet.  The first heart sound is normal.  The second heart sound is physiologically split.  There is no murmur gallop rub or click.  There is no abnormal lift or heave.  The abdomen is soft and nontender.  The bowel sounds are normal.  The liver and spleen are not enlarged.  There are no abdominal masses.  There are no abdominal bruits.  Extremities reveal good pedal pulses.  There is no phlebitis or edema.  There is no cyanosis or clubbing.  Strength is normal and symmetrical in all extremities.  There is no lateralizing weakness.  There are no sensory deficits.  The skin is warm and dry.  There is no rash.    Assessment / Plan: Continue same medication.  Get blood work today.  At gabapentin 300 mg at bedtime.  Recheck in 4 months for followup office visit EKG lipid panel hepatic function panel basal metabolic panel and A1c

## 2013-07-06 NOTE — Patient Instructions (Addendum)
Will obtain labs today and call you with the results (lp/bmet/hfp/a1c)  START GABAPENTIN 300 MG DAILY   Your physician wants you to follow-up in: 4 months with fasting labs (lp/bmet/hfp) and ekg You will receive a reminder letter in the mail two months in advance. If you don't receive a letter, please call our office to schedule the follow-up appointment.

## 2013-07-08 ENCOUNTER — Telehealth: Payer: Self-pay | Admitting: *Deleted

## 2013-07-08 ENCOUNTER — Other Ambulatory Visit: Payer: Self-pay | Admitting: *Deleted

## 2013-07-08 MED ORDER — ROSUVASTATIN CALCIUM 10 MG PO TABS: 10.0000 mg | ORAL_TABLET | Freq: Every morning | ORAL | Status: DC

## 2013-07-08 NOTE — Telephone Encounter (Signed)
Message copied by Burnell Blanks on Fri Jul 08, 2013  1:57 PM ------      Message from: [name]      Created: Wed Jul 06, 2013  9:29 PM       Please report to patient.  The recent labs are stable. Continue same medication and careful diet. Cholesterol too high so resume Crestor 10 mg daily ------

## 2013-07-08 NOTE — Telephone Encounter (Signed)
Advised patient of lab results and medication change  

## 2013-07-13 ENCOUNTER — Encounter: Payer: Self-pay | Admitting: Internal Medicine

## 2013-07-13 ENCOUNTER — Ambulatory Visit (INDEPENDENT_AMBULATORY_CARE_PROVIDER_SITE_OTHER): Payer: Medicare Other | Admitting: Internal Medicine

## 2013-07-13 VITALS — BP 160/90 | HR 80 | Ht 64.0 in | Wt 154.0 lb

## 2013-07-13 DIAGNOSIS — Z950 Presence of cardiac pacemaker: Secondary | ICD-10-CM | POA: Diagnosis not present

## 2013-07-13 DIAGNOSIS — I10 Essential (primary) hypertension: Secondary | ICD-10-CM

## 2013-07-13 DIAGNOSIS — I495 Sick sinus syndrome: Secondary | ICD-10-CM

## 2013-07-13 LAB — PACEMAKER DEVICE OBSERVATION
AL IMPEDENCE PM: 654 Ohm
BATTERY VOLTAGE: 2.8 V
RV LEAD AMPLITUDE: 5.6 mv
RV LEAD IMPEDENCE PM: 739 Ohm
VENTRICULAR PACING PM: 22

## 2013-07-13 MED ORDER — CARVEDILOL 12.5 MG PO TABS: ORAL_TABLET | ORAL | Status: DC

## 2013-07-13 NOTE — Addendum Note (Signed)
Addended by: Dennis Bast F on: 07/13/2013 03:43 PM   Modules accepted: Orders

## 2013-07-13 NOTE — Progress Notes (Signed)
Primary Cardiologist Dr Patty Sermons  The patient presents today for routine electrophysiology followup.  Since last being seen in our clinic, the patient reports doing very well.  Today, she denies symptoms of palpitations, chest pain, shortness of breath, orthopnea, PND, lower extremity edema, dizziness, presyncope, syncope, or neurologic sequela.  The patient feels that she is tolerating medications without difficulties and is otherwise without complaint today.   Past Medical History  Diagnosis Date  . Hypertension   . Diabetes mellitus     NON INSULIN DEPENDENT  . Hypercholesterolemia   . DJD (degenerative joint disease)   . History of diabetic neuropathy   . Chest pain   . DOE (dyspnea on exertion)   . IHD (ischemic heart disease)   . SSS (sick sinus syndrome)     s/p PPM by JA for SSS and syncope 9/11  . Breast cancer   . GERD (gastroesophageal reflux disease)   . Hemorrhoids   . CAD (coronary artery disease)    Past Surgical History  Procedure Laterality Date  . Cardiac catheterization  07/05/2009    EF 55-60%  . Insert / replace / remove pacemaker  9/11    SSS and syncope, implanted by JA (MDT)  . Mastectomy  1992    BILATERAL WITH RECONSTRUCTION  . Colonoscopy      Current Outpatient Prescriptions  Medication Sig Dispense Refill  . aspirin 81 MG tablet Take 81 mg by mouth every morning.       . Calcium Carbonate-Vitamin D (CALCIUM + D PO) Take 1 tablet by mouth every morning.       . carvedilol (COREG) 12.5 MG tablet Take 1 tablet (12.5 mg total) by mouth 2 (two) times daily with a meal.  60 tablet  12  . Cyanocobalamin (VITAMIN B12 PO) Take 1 tablet by mouth daily.       . famotidine (PEPCID) 20 MG tablet Take 1 tablet (20 mg total) by mouth 2 (two) times daily.  60 tablet  5  . fish oil-omega-3 fatty acids 1000 MG capsule Take 1 g by mouth every morning.       . gabapentin (NEURONTIN) 300 MG capsule Take 1 capsule (300 mg total) by mouth daily.  30 capsule  prn  .  lisinopril (PRINIVIL,ZESTRIL) 10 MG tablet Take 2 tablets (20 mg total) by mouth 2 (two) times daily.  180 tablet  3  . LORazepam (ATIVAN) 1 MG tablet Take 0.5 tablets (0.5 mg total) by mouth every 8 (eight) hours as needed for anxiety. For anxiety  100 tablet  1  . metFORMIN (GLUCOPHAGE) 500 MG tablet Take 500 mg by mouth daily with breakfast.      . nitroGLYCERIN (NITROSTAT) 0.4 MG SL tablet Place 0.4 mg under the tongue every 5 (five) minutes as needed for chest pain. x3 doses as needed for chest pain      . rosuvastatin (CRESTOR) 10 MG tablet Take 1 tablet (10 mg total) by mouth every morning.  30 tablet  5   No current facility-administered medications for this visit.    Allergies  Allergen Reactions  . Lyrica [Pregabalin]     wgt gain  . Sulfonamide Derivatives Rash    History   Social History  . Marital Status: Widowed    Spouse Name: N/A    Number of Children: N/A  . Years of Education: N/A   Occupational History  . Not on file.   Social History Main Topics  . Smoking status: Never Smoker   .  Smokeless tobacco: Not on file  . Alcohol Use: No  . Drug Use: No  . Sexual Activity:    Other Topics Concern  . Not on file   Social History Narrative  . No narrative on file    Family History  Problem Relation Age of Onset  . Cancer Mother   . Cancer Father     Physical Exam: Filed Vitals:   07/13/13 1456  BP: 160/90  Pulse: 80  Height: 5\' 4"  (1.626 m)  Weight: 154 lb (69.854 kg)   Repeat BP by MD 158/86 GEN- The patient is well appearing, alert and oriented x 3 today.   Head- normocephalic, atraumatic Eyes-  Sclera clear, conjunctiva pink Ears- hearing intact Oropharynx- clear Neck- supple, no JVP Lymph- no cervical lymphadenopathy Lungs- Clear to ausculation bilaterally, normal work of breathing Chest- pacemaker pocket is well healed Heart- Regular rate and rhythm, no murmurs, rubs or gallops, PMI not laterally displaced GI- soft, NT, ND, +  BS Extremities- no clubbing, cyanosis, or edema  Pacemaker interrogation- reviewed in detail today,  See PACEART report  Assessment and Plan:   SICK SINUS SYNDROME  Normal pacemaker function  See Pace Art report  No changes today   HYPERTENSION  Elevated today, Increase coreg to 18.75mg  BID  Follow-up with Dr Patty Sermons Return to the device clinic in 6 months Will follow-up with my PA in 1 year

## 2013-07-13 NOTE — Patient Instructions (Addendum)
Your physician wants you to follow-up in: 6 months in the device clinic and 12 months with Dr Johney Frame Bonita Quin will receive a reminder letter in the mail two months in advance. If you don't receive a letter, please call our office to schedule the follow-up appointment.     Your physician has recommended you make the following change in your medication:  1) Increase Carvedilol to 1 1/2 tablets twice daily

## 2013-07-21 ENCOUNTER — Other Ambulatory Visit: Payer: Self-pay | Admitting: Cardiology

## 2013-07-26 DIAGNOSIS — Z23 Encounter for immunization: Secondary | ICD-10-CM | POA: Diagnosis not present

## 2013-08-03 ENCOUNTER — Other Ambulatory Visit: Payer: Self-pay | Admitting: *Deleted

## 2013-08-03 MED ORDER — FAMOTIDINE 20 MG PO TABS: 20.0000 mg | ORAL_TABLET | Freq: Two times a day (BID) | ORAL | Status: DC

## 2013-08-15 DIAGNOSIS — E119 Type 2 diabetes mellitus without complications: Secondary | ICD-10-CM | POA: Diagnosis not present

## 2013-08-15 DIAGNOSIS — Z961 Presence of intraocular lens: Secondary | ICD-10-CM | POA: Diagnosis not present

## 2013-09-12 ENCOUNTER — Inpatient Hospital Stay (HOSPITAL_COMMUNITY)
Admission: EM | Admit: 2013-09-12 | Discharge: 2013-09-16 | DRG: 418 | Disposition: A | Discharge status: Home Health | Payer: Medicare Other | Attending: Internal Medicine | Admitting: Internal Medicine

## 2013-09-12 ENCOUNTER — Emergency Department (HOSPITAL_COMMUNITY): Payer: Medicare Other

## 2013-09-12 ENCOUNTER — Inpatient Hospital Stay (HOSPITAL_COMMUNITY): Payer: Medicare Other

## 2013-09-12 ENCOUNTER — Encounter (HOSPITAL_COMMUNITY): Payer: Self-pay | Admitting: Emergency Medicine

## 2013-09-12 DIAGNOSIS — Z7982 Long term (current) use of aspirin: Secondary | ICD-10-CM

## 2013-09-12 DIAGNOSIS — I209 Angina pectoris, unspecified: Secondary | ICD-10-CM | POA: Diagnosis present

## 2013-09-12 DIAGNOSIS — E1142 Type 2 diabetes mellitus with diabetic polyneuropathy: Secondary | ICD-10-CM | POA: Diagnosis present

## 2013-09-12 DIAGNOSIS — E1140 Type 2 diabetes mellitus with diabetic neuropathy, unspecified: Secondary | ICD-10-CM | POA: Diagnosis present

## 2013-09-12 DIAGNOSIS — K828 Other specified diseases of gallbladder: Secondary | ICD-10-CM | POA: Diagnosis not present

## 2013-09-12 DIAGNOSIS — E1149 Type 2 diabetes mellitus with other diabetic neurological complication: Secondary | ICD-10-CM | POA: Diagnosis present

## 2013-09-12 DIAGNOSIS — K8020 Calculus of gallbladder without cholecystitis without obstruction: Secondary | ICD-10-CM

## 2013-09-12 DIAGNOSIS — I119 Hypertensive heart disease without heart failure: Secondary | ICD-10-CM

## 2013-09-12 DIAGNOSIS — E785 Hyperlipidemia, unspecified: Secondary | ICD-10-CM | POA: Diagnosis present

## 2013-09-12 DIAGNOSIS — K8042 Calculus of bile duct with acute cholecystitis without obstruction: Secondary | ICD-10-CM | POA: Diagnosis not present

## 2013-09-12 DIAGNOSIS — I10 Essential (primary) hypertension: Secondary | ICD-10-CM | POA: Diagnosis not present

## 2013-09-12 DIAGNOSIS — E119 Type 2 diabetes mellitus without complications: Secondary | ICD-10-CM | POA: Diagnosis present

## 2013-09-12 DIAGNOSIS — Z950 Presence of cardiac pacemaker: Secondary | ICD-10-CM | POA: Diagnosis not present

## 2013-09-12 DIAGNOSIS — R109 Unspecified abdominal pain: Secondary | ICD-10-CM | POA: Diagnosis not present

## 2013-09-12 DIAGNOSIS — R1084 Generalized abdominal pain: Secondary | ICD-10-CM | POA: Diagnosis not present

## 2013-09-12 DIAGNOSIS — E7800 Pure hypercholesterolemia, unspecified: Secondary | ICD-10-CM | POA: Diagnosis present

## 2013-09-12 DIAGNOSIS — K8050 Calculus of bile duct without cholangitis or cholecystitis without obstruction: Secondary | ICD-10-CM | POA: Diagnosis not present

## 2013-09-12 DIAGNOSIS — I160 Hypertensive urgency: Secondary | ICD-10-CM | POA: Diagnosis present

## 2013-09-12 DIAGNOSIS — I2510 Atherosclerotic heart disease of native coronary artery without angina pectoris: Secondary | ICD-10-CM | POA: Diagnosis not present

## 2013-09-12 DIAGNOSIS — Z853 Personal history of malignant neoplasm of breast: Secondary | ICD-10-CM | POA: Diagnosis not present

## 2013-09-12 DIAGNOSIS — R932 Abnormal findings on diagnostic imaging of liver and biliary tract: Secondary | ICD-10-CM | POA: Diagnosis not present

## 2013-09-12 DIAGNOSIS — M1990 Unspecified osteoarthritis, unspecified site: Secondary | ICD-10-CM | POA: Diagnosis present

## 2013-09-12 DIAGNOSIS — I495 Sick sinus syndrome: Secondary | ICD-10-CM

## 2013-09-12 DIAGNOSIS — K8010 Calculus of gallbladder with chronic cholecystitis without obstruction: Secondary | ICD-10-CM | POA: Diagnosis not present

## 2013-09-12 DIAGNOSIS — K8309 Other cholangitis: Secondary | ICD-10-CM | POA: Diagnosis not present

## 2013-09-12 DIAGNOSIS — K829 Disease of gallbladder, unspecified: Secondary | ICD-10-CM

## 2013-09-12 DIAGNOSIS — K8040 Calculus of bile duct with cholecystitis, unspecified, without obstruction: Secondary | ICD-10-CM

## 2013-09-12 DIAGNOSIS — R1011 Right upper quadrant pain: Secondary | ICD-10-CM | POA: Diagnosis not present

## 2013-09-12 HISTORY — DX: Calculus of gallbladder without cholecystitis without obstruction: K80.20

## 2013-09-12 LAB — POCT I-STAT, CHEM 8
Creatinine, Ser: 0.9 mg/dL (ref 0.50–1.10)
Glucose, Bld: 191 mg/dL — ABNORMAL HIGH (ref 70–99)
HCT: 34 % — ABNORMAL LOW (ref 36.0–46.0)
Hemoglobin: 11.6 g/dL — ABNORMAL LOW (ref 12.0–15.0)
Potassium: 3.7 mEq/L (ref 3.5–5.1)
Sodium: 139 mEq/L (ref 135–145)
TCO2: 21 mmol/L (ref 0–100)

## 2013-09-12 LAB — GLUCOSE, CAPILLARY
Glucose-Capillary: 128 mg/dL — ABNORMAL HIGH (ref 70–99)
Glucose-Capillary: 130 mg/dL — ABNORMAL HIGH (ref 70–99)

## 2013-09-12 LAB — URINALYSIS, ROUTINE W REFLEX MICROSCOPIC
Nitrite: NEGATIVE
Protein, ur: 30 mg/dL — AB
Urobilinogen, UA: 1 mg/dL (ref 0.0–1.0)

## 2013-09-12 LAB — COMPREHENSIVE METABOLIC PANEL
Albumin: 3.6 g/dL (ref 3.5–5.2)
BUN: 15 mg/dL (ref 6–23)
CO2: 23 mEq/L (ref 19–32)
Chloride: 100 mEq/L (ref 96–112)
Creatinine, Ser: 0.66 mg/dL (ref 0.50–1.10)
GFR calc non Af Amer: 79 mL/min — ABNORMAL LOW (ref >90)
Total Bilirubin: 3.1 mg/dL — ABNORMAL HIGH (ref 0.3–1.2)

## 2013-09-12 LAB — CBC WITH DIFFERENTIAL/PLATELET
Basophils Relative: 0 % (ref 0–1)
HCT: 35.2 % — ABNORMAL LOW (ref 36.0–46.0)
Hemoglobin: 12.2 g/dL (ref 12.0–15.0)
MCH: 30 pg (ref 26.0–34.0)
MCHC: 34.7 g/dL (ref 30.0–36.0)
MCV: 86.5 fL (ref 78.0–100.0)
Monocytes Absolute: 0.4 10*3/uL (ref 0.1–1.0)
Monocytes Relative: 3 % (ref 3–12)
Neutro Abs: 10.6 10*3/uL — ABNORMAL HIGH (ref 1.7–7.7)

## 2013-09-12 LAB — HEMOGLOBIN A1C
Hgb A1c MFr Bld: 5.9 % — ABNORMAL HIGH (ref <5.7)
Mean Plasma Glucose: 123 mg/dL — ABNORMAL HIGH (ref <117)

## 2013-09-12 LAB — LIPASE, BLOOD: Lipase: 81 U/L — ABNORMAL HIGH (ref 11–59)

## 2013-09-12 LAB — URINE MICROSCOPIC-ADD ON

## 2013-09-12 MED ORDER — CARVEDILOL 6.25 MG PO TABS
18.7500 mg | ORAL_TABLET | Freq: Two times a day (BID) | ORAL | Status: DC
  Administered 2013-09-12 – 2013-09-16 (×8): 18.75 mg via ORAL
  Filled 2013-09-12 (×12): qty 1

## 2013-09-12 MED ORDER — NITROGLYCERIN 0.4 MG SL SUBL: 0.4000 mg | SUBLINGUAL_TABLET | SUBLINGUAL | Status: DC | PRN

## 2013-09-12 MED ORDER — ONDANSETRON HCL 4 MG/2ML IJ SOLN: 4.0000 mg | Freq: Four times a day (QID) | INTRAMUSCULAR | Status: DC | PRN

## 2013-09-12 MED ORDER — SODIUM CHLORIDE 0.9 % IJ SOLN
3.0000 mL | Freq: Two times a day (BID) | INTRAMUSCULAR | Status: DC
  Administered 2013-09-14 – 2013-09-16 (×5): 3 mL via INTRAVENOUS

## 2013-09-12 MED ORDER — SODIUM CHLORIDE 0.9 % IV SOLN
INTRAVENOUS | Status: DC
  Administered 2013-09-12 – 2013-09-15 (×4): via INTRAVENOUS

## 2013-09-12 MED ORDER — LORAZEPAM 0.5 MG PO TABS
0.5000 mg | ORAL_TABLET | Freq: Three times a day (TID) | ORAL | Status: DC | PRN
  Administered 2013-09-13: 0.5 mg via ORAL
  Filled 2013-09-12: qty 1

## 2013-09-12 MED ORDER — ASPIRIN 81 MG PO TABS: 81.0000 mg | ORAL_TABLET | Freq: Every morning | ORAL | Status: DC

## 2013-09-12 MED ORDER — MORPHINE SULFATE 4 MG/ML IJ SOLN
2.0000 mg | Freq: Once | INTRAMUSCULAR | Status: AC
  Administered 2013-09-12: 2 mg via INTRAVENOUS
  Filled 2013-09-12: qty 1

## 2013-09-12 MED ORDER — ONDANSETRON HCL 4 MG/2ML IJ SOLN
4.0000 mg | Freq: Once | INTRAMUSCULAR | Status: AC
  Administered 2013-09-12: 4 mg via INTRAVENOUS
  Filled 2013-09-12: qty 2

## 2013-09-12 MED ORDER — INSULIN ASPART 100 UNIT/ML ~~LOC~~ SOLN
0.0000 [IU] | Freq: Three times a day (TID) | SUBCUTANEOUS | Status: DC
  Administered 2013-09-12 – 2013-09-14 (×3): 1 [IU] via SUBCUTANEOUS
  Administered 2013-09-15 (×3): 2 [IU] via SUBCUTANEOUS
  Administered 2013-09-16 (×2): 1 [IU] via SUBCUTANEOUS

## 2013-09-12 MED ORDER — LISINOPRIL 20 MG PO TABS
20.0000 mg | ORAL_TABLET | Freq: Two times a day (BID) | ORAL | Status: DC
  Administered 2013-09-12 – 2013-09-16 (×9): 20 mg via ORAL
  Filled 2013-09-12 (×10): qty 1

## 2013-09-12 MED ORDER — IOHEXOL 300 MG/ML  SOLN
25.0000 mL | INTRAMUSCULAR | Status: DC
  Administered 2013-09-12: 25 mL via ORAL

## 2013-09-12 MED ORDER — HYDROCODONE-ACETAMINOPHEN 5-325 MG PO TABS: 1.0000 | ORAL_TABLET | ORAL | Status: DC | PRN

## 2013-09-12 MED ORDER — HYDROMORPHONE HCL PF 1 MG/ML IJ SOLN: 1.0000 mg | INTRAMUSCULAR | Status: DC | PRN

## 2013-09-12 MED ORDER — ASPIRIN EC 81 MG PO TBEC
81.0000 mg | DELAYED_RELEASE_TABLET | Freq: Every day | ORAL | Status: DC
  Administered 2013-09-12 – 2013-09-13 (×2): 81 mg via ORAL
  Filled 2013-09-12 (×3): qty 1

## 2013-09-12 MED ORDER — FENTANYL CITRATE 0.05 MG/ML IJ SOLN
50.0000 ug | Freq: Once | INTRAMUSCULAR | Status: AC
  Administered 2013-09-12: 50 ug via INTRAVENOUS
  Filled 2013-09-12: qty 2

## 2013-09-12 MED ORDER — SODIUM CHLORIDE 0.9 % IV SOLN
Freq: Once | INTRAVENOUS | Status: AC
  Administered 2013-09-12: 05:00:00 via INTRAVENOUS

## 2013-09-12 MED ORDER — ONDANSETRON HCL 4 MG PO TABS: 4.0000 mg | ORAL_TABLET | Freq: Four times a day (QID) | ORAL | Status: DC | PRN

## 2013-09-12 MED ORDER — ACETAMINOPHEN 325 MG PO TABS
650.0000 mg | ORAL_TABLET | Freq: Four times a day (QID) | ORAL | Status: DC | PRN
  Administered 2013-09-15 – 2013-09-16 (×2): 650 mg via ORAL
  Filled 2013-09-12 (×2): qty 2

## 2013-09-12 MED ORDER — INSULIN ASPART 100 UNIT/ML ~~LOC~~ SOLN: 0.0000 [IU] | Freq: Every day | SUBCUTANEOUS | Status: DC

## 2013-09-12 MED ORDER — HYDRALAZINE HCL 20 MG/ML IJ SOLN
10.0000 mg | Freq: Once | INTRAMUSCULAR | Status: AC
  Administered 2013-09-12: 10 mg via INTRAVENOUS
  Filled 2013-09-12: qty 1

## 2013-09-12 MED ORDER — IOHEXOL 300 MG/ML  SOLN
80.0000 mL | Freq: Once | INTRAMUSCULAR | Status: AC | PRN
  Administered 2013-09-12: 80 mL via INTRAVENOUS

## 2013-09-12 MED ORDER — SODIUM CHLORIDE 0.9 % IV SOLN: INTRAVENOUS | Status: AC

## 2013-09-12 MED ORDER — ACETAMINOPHEN 650 MG RE SUPP: 650.0000 mg | Freq: Four times a day (QID) | RECTAL | Status: DC | PRN

## 2013-09-12 NOTE — Consult Note (Signed)
Reason for Consult: Choledocholithiasis Referring Physician: [facility]  Jessica [name] HPI: This is an 79 year old female admitted for choledocholithiasis and cholelithiasis.  She started to have symptoms acutely last night at 11 PM.  The pain continued to worsen and it was associated with nausea.  She presented to the ER and she was identified to have an elevated liver panel as well as an abnormal CT scan.  The CT scan revealed multiple stones in a dilated CBD as well as sludge in the gallbladder.  Additionally, in the distal CBD there may be a mass, but it is unclear.  Currently she feels well after having two large bowel movements.  Her pain, she describes, is located in the mid to lower abdomen with radiation to her back.  Past Medical History  Diagnosis Date  . Hypertension   . Diabetes mellitus     NON INSULIN DEPENDENT  . Hypercholesterolemia   . DJD (degenerative joint disease)   . History of diabetic neuropathy   . Chest pain   . DOE (dyspnea on exertion)   . IHD (ischemic heart disease)   . SSS (sick sinus syndrome)     s/p PPM by [facility] for SSS and syncope 9/11  . Breast cancer   . GERD (gastroesophageal reflux disease)   . Hemorrhoids   . CAD (coronary artery disease)     Past Surgical History  Procedure Laterality Date  . Cardiac catheterization  07/05/2009    EF 55-60%  . Insert / replace / remove pacemaker  9/11    SSS and syncope, implanted by JA (MDT)  . Mastectomy  1992    BILATERAL WITH RECONSTRUCTION  . Colonoscopy      Family History  Problem Relation Age of Onset  . Cancer Mother   . Cancer Father     Social History:  reports that she has never smoked. She does not have any smokeless tobacco history on file. She reports that she does not drink alcohol or use illicit drugs.  Allergies:  Allergies  Allergen Reactions  . Lyrica [Pregabalin] Swelling    wgt gain  . Sulfonamide Derivatives Rash    Medications:  Scheduled: . sodium chloride    Intravenous STAT  . aspirin EC  81 mg Oral Daily  . carvedilol  18.75 mg Oral BID WC  . insulin aspart  0-5 Units Subcutaneous QHS  . insulin aspart  0-9 Units Subcutaneous TID WC  . lisinopril  20 mg Oral BID  . sodium chloride  3 mL Intravenous Q12H   Continuous: . sodium chloride 75 mL/hr at 09/12/13 1237    Results for orders placed during the hospital encounter of 09/12/13 (from the past 24 hour(s))  CBC WITH DIFFERENTIAL     Status: Abnormal   Collection Time    09/12/13  5:15 AM      Result Value Range   WBC 11.9 (*) 4.0 - 10.5 K/uL   RBC 4.07  3.87 - 5.11 MIL/uL   Hemoglobin 12.2  12.0 - 15.0 g/dL   HCT 98.1 (*) 19.1 - 47.8 %   MCV 86.5  78.0 - 100.0 fL   MCH 30.0  26.0 - 34.0 pg   MCHC 34.7  30.0 - 36.0 g/dL   RDW 29.5  62.1 - 30.8 %   Platelets 258  150 - 400 K/uL   Neutrophils Relative % 88 (*) 43 - 77 %   Neutro Abs 10.6 (*) 1.7 - 7.7 K/uL   Lymphocytes  Relative 8 (*) 12 - 46 %   Lymphs Abs 1.0  0.7 - 4.0 K/uL   Monocytes Relative 3  3 - 12 %   Monocytes Absolute 0.4  0.1 - 1.0 K/uL   Eosinophils Relative 0  0 - 5 %   Eosinophils Absolute 0.0  0.0 - 0.7 K/uL   Basophils Relative 0  0 - 1 %   Basophils Absolute 0.0  0.0 - 0.1 K/uL  COMPREHENSIVE METABOLIC PANEL     Status: Abnormal   Collection Time    09/12/13  5:15 AM      Result Value Range   Sodium 136  135 - 145 mEq/L   Potassium 3.5  3.5 - 5.1 mEq/L   Chloride 100  96 - 112 mEq/L   CO2 23  19 - 32 mEq/L   Glucose, Bld 182 (*) 70 - 99 mg/dL   BUN 15  6 - 23 mg/dL   Creatinine, Ser 4.78  0.50 - 1.10 mg/dL   Calcium 9.6  8.4 - 29.5 mg/dL   Total Protein 7.3  6.0 - 8.3 g/dL   Albumin 3.6  3.5 - 5.2 g/dL   AST 45 (*) 0 - 37 U/L   ALT 70 (*) 0 - 35 U/L   Alkaline Phosphatase 295 (*) 39 - 117 U/L   Total Bilirubin 3.1 (*) 0.3 - 1.2 mg/dL   GFR calc non Af Amer 79 (*) >90 mL/min   GFR calc Af Amer >90  >90 mL/min  LIPASE, BLOOD     Status: Abnormal   Collection Time    09/12/13  5:15 AM      Result  Value Range   Lipase 81 (*) 11 - 59 U/L  POCT I-STAT, CHEM 8     Status: Abnormal   Collection Time    09/12/13  5:20 AM      Result Value Range   Sodium 139  135 - 145 mEq/L   Potassium 3.7  3.5 - 5.1 mEq/L   Chloride 102  96 - 112 mEq/L   BUN 15  6 - 23 mg/dL   Creatinine, Ser 6.21  0.50 - 1.10 mg/dL   Glucose, Bld 308 (*) 70 - 99 mg/dL   Calcium, Ion 6.57  8.46 - 1.30 mmol/L   TCO2 21  0 - 100 mmol/L   Hemoglobin 11.6 (*) 12.0 - 15.0 g/dL   HCT 96.2 (*) 95.2 - 84.1 %  CG4 I-STAT (LACTIC ACID)     Status: None   Collection Time    09/12/13  5:21 AM      Result Value Range   Lactic Acid, Venous 1.02  0.5 - 2.2 mmol/L  URINALYSIS, ROUTINE W REFLEX MICROSCOPIC     Status: Abnormal   Collection Time    09/12/13  6:39 AM      Result Value Range   Color, Urine YELLOW  YELLOW   APPearance CLOUDY (*) CLEAR   Specific Gravity, Urine 1.014  1.005 - 1.030   pH 7.5  5.0 - 8.0   Glucose, UA 100 (*) NEGATIVE mg/dL   Hgb urine dipstick NEGATIVE  NEGATIVE   Bilirubin Urine NEGATIVE  NEGATIVE   Ketones, ur 15 (*) NEGATIVE mg/dL   Protein, ur 30 (*) NEGATIVE mg/dL   Urobilinogen, UA 1.0  0.0 - 1.0 mg/dL   Nitrite NEGATIVE  NEGATIVE   Leukocytes, UA TRACE (*) NEGATIVE  URINE MICROSCOPIC-ADD ON     Status: Abnormal   Collection Time  09/12/13  6:39 AM      Result Value Range   Squamous Epithelial / LPF RARE  RARE   WBC, UA 3-6  <3 WBC/hpf   RBC / HPF 0-2  <3 RBC/hpf   Bacteria, UA FEW (*) RARE   Urine-Other AMORPHOUS URATES/PHOSPHATES    GLUCOSE, CAPILLARY     Status: Abnormal   Collection Time    09/12/13  4:49 PM      Result Value Range   Glucose-Capillary 128 (*) 70 - 99 mg/dL     US Abdomen Complete  09/12/2013   CLINICAL DATA:  Choledocholithiasis, elevated bilirubin and LFTs  EXAM: ULTRASOUND ABDOMEN COMPLETE  COMPARISON:  Concurrently obtained CT scan of the abdomen and pelvis performed earlier today  FINDINGS: Gallbladder  The gallbladder is distended. Echogenic  shadowing debris consistent with sludge and small stones layers dependently. There is diffuse gallbladder wall thickening up to 7.3 mm. Trace pericholecystic fluid. Per the sonographer, sonographic Eulah Pont sign was negative.  Common bile duct  Diameter: Dilated up to 1.5 cm. The distal most common bile duct is obscured by overlying bowel gas. Small echogenic foci are noted within the dilated common duct suggesting the presence of sludge and/ or small stones.  Liver  No focal lesion identified. Within normal limits in parenchymal echogenicity. Mild intrahepatic biliary ductal dilatation.  IVC  No abnormality visualized.  Pancreas  Visualized portion unremarkable. Calcification in the region the pancreatic tail is likely vascular in related to the splenic artery.  Spleen  Within normal limits for size. There are 3 sonographically simple cystic lesions. The largest measures up to 1.5 cm.  Right Kidney  Length: 10.6. Echogenicity within normal limits. No mass or hydronephrosis visualized. Mild nonspecific perinephric fluid.  Left Kidney  Length: 11.3 cm. Echogenicity within normal limits. No mass or hydronephrosis visualized.  Abdominal aorta  No aneurysm visualized.  IMPRESSION: 1. Cholelithiasis with dilated intra and extrahepatic bile ducts. The gallbladder is distended in the gallbladder wall thickened with a small amount of pericholecystic fluid. The sonographic Eulah Pont sign was negative, however this finding is less sensitive in the elderly population. Findings are concerning for acute versus chronic cholecystitis. 2. Dilated common bile duct containing internal debris consistent with the findings of choledocholithiasis on the recently obtained CT scan of the abdomen and pelvis. Recommend a GI consultation if not already obtained for possible ERCP. 3. Sonographically simple splenic cysts.   Electronically Signed   By: Malachy Moan M.D.   On: 09/12/2013 15:53   Ct Abdomen Pelvis W Contrast  09/12/2013    CLINICAL DATA:  Diffuse lower abdominal pain.  EXAM: CT ABDOMEN AND PELVIS WITH CONTRAST  TECHNIQUE: Multidetector CT imaging of the abdomen and pelvis was performed using the standard protocol following bolus administration of intravenous contrast.  CONTRAST:  80mL OMNIPAQUE IOHEXOL 300 MG/ML  SOLN  COMPARISON:  None  FINDINGS: Visualization of the lower thorax demonstrates small, right greater than left, pleural effusions. Bilateral lower lung ground-glass opacities likely represent atelectasis. Heart is enlarged.  The liver is heterogeneous in attenuation. There is relative increased enhancement of the liver adjacent to the gallbladder. Additionally there is suggestion of a small amount of fluid insinuated between the gallbladder and the right hepatic lobe. Layering high attenuation material within the gallbladder lumen. Marked intrahepatic and extrahepatic biliary ductal dilatation with the common bile duct measuring up to 1.5 cm. Within the distal aspect of the common bile duct there are high attenuation filling defects measuring approximately 3.5 cm  proximal to the ampulla.  Spleen is normal in size. There are 2 low-attenuation lesions within the spleen measuring 1.5 and 1.4 cm respectively. Mild atrophy of the pancreatic parenchyma. Mild thickening of the right adrenal gland. The left adrenal gland is grossly unremarkable. Kidneys enhance symmetrically with contrast. No hydronephrosis.  Normal caliber abdominal aorta with scattered calcified atherosclerotic plaque. Urinary bladder is unremarkable. Extensive descending and sigmoid colonic diverticulosis without CT evidence for acute diverticulitis. No abnormal bowel wall thickening. No evidence for bowel obstruction. No free fluid or free intraperitoneal air. Surgical clips within the anterior lower abdominal wall.  Lower lumbar spine degenerative change. No aggressive appearing osseous lesions.  IMPRESSION: 1. Marked intrahepatic and extrahepatic biliary  ductal dilatation with the common bile duct measuring up to 1.5 cm. There are multiple high density filling defects demonstrated within the distal aspect of the common bile duct, raising the possibility of choledocholithiasis. Intraductal mass lesion is an alternative possibility. Further evaluation with ERCP is recommended. 2. Cholelithiasis. Relative increased enhancement of the liver parenchyma adjacent to the gallbladder fossa with a small amount of fluid versus dilated duct insinuated between the gallbladder and the liver. Further evaluation/characterization of the gallbladder and associated hepatic parenchyma can be performed with gallbladder ultrasound as clinically indicated. 3. Small right greater than left bilateral pleural effusions with underlying atelectasis. 4. Two adjacent low attenuation splenic lesions as above. While these are likely benign (cyst, hemangioma) they demonstrate nonspecific imaging features and a followup MRI is recommended in 6 months to ensure stability.   Electronically Signed   By: Annia Belt M.D.   On: 09/12/2013 08:32    ROS:  As stated above in the HPI otherwise negative.  Blood pressure 163/75, pulse 70, temperature 98.1 F (36.7 C), temperature source Oral, resp. rate 22, height 5\' 4"  (1.626 m), weight 154 lb (69.854 kg), SpO2 96.00%.    PE: Gen: NAD, Alert and Oriented HEENT:  [place]/AT, EOMI Neck: Supple, no LAD Lungs: CTA Bilaterally CV: RRR without M/G/R ABM: Soft, NTND, +BS Ext: No C/C/E  Assessment/Plan: 1) Choledocholithiasis. 2) Cholelithiasis. 3) Abnormal liver enzymes. 4) ? Mass in the distal CBD.   I will pursue further evaluation and treatment with an ERCP.  If there is mass in the distal CBD, I brushings will be obtained.  Plan: 1) ERCP tomorrow.  Sasuke Yaffe D 09/12/2013, 5:38 PM

## 2013-09-12 NOTE — ED Notes (Signed)
Patient tolerating contrast well at this time.

## 2013-09-12 NOTE — ED Provider Notes (Signed)
CSN: [id number]     Arrival date & time 09/12/13  0434 History   First MD Initiated Contact with Patient 09/12/13 0458     Chief Complaint  Patient presents with  . Abdominal Pain   (Consider location/radiation/quality/duration/timing/severity/associated sxs/prior Treatment) HPI Comments: 79 year old female who states that this evening she developed acute onset of lower abdominal pain that was associated with abdominal distention and nausea. She has not been able to vomit and has not had any bowel movements in approximately 24 hours. She has not had prior abdominal surgery though she has had a mastectomy in the past with a subsequent reconstructive surgery. This pain is persistent, radiates to her back and is poorly described. She admits to having decreased urinary frequency but no dysuria or hematuria.  Patient is a 79 y.o. female presenting with abdominal pain. The history is provided by the patient, a relative and the EMS personnel.  Abdominal Pain   Past Medical History  Diagnosis Date  . Hypertension   . Diabetes mellitus     NON INSULIN DEPENDENT  . Hypercholesterolemia   . DJD (degenerative joint disease)   . History of diabetic neuropathy   . Chest pain   . DOE (dyspnea on exertion)   . IHD (ischemic heart disease)   . SSS (sick sinus syndrome)     s/p PPM by [facility] for SSS and syncope 9/11  . Breast cancer   . GERD (gastroesophageal reflux disease)   . Hemorrhoids   . CAD (coronary artery disease)    Past Surgical History  Procedure Laterality Date  . Cardiac catheterization  07/05/2009    EF 55-60%  . Insert / replace / remove pacemaker  9/11    SSS and syncope, implanted by JA (MDT)  . Mastectomy  1992    BILATERAL WITH RECONSTRUCTION  . Colonoscopy     Family History  Problem Relation Age of Onset  . Cancer Mother   . Cancer Father    History  Substance Use Topics  . Smoking status: Never Smoker   . Smokeless tobacco: Not on file  . Alcohol Use: No   OB  History   Grav Para Term Preterm Abortions TAB SAB Ect Mult Living                 Review of Systems  Gastrointestinal: Positive for abdominal pain.  All other systems reviewed and are negative.    Allergies  Lyrica and Sulfonamide derivatives  Home Medications   Current Outpatient Rx  Name  Route  Sig  Dispense  Refill  . aspirin 81 MG tablet   Oral   Take 81 mg by mouth every morning.          . Calcium Carbonate-Vitamin D (CALCIUM + D PO)   Oral   Take 1 tablet by mouth every morning.          . carvedilol (COREG) 12.5 MG tablet   Oral   Take 18.75 mg by mouth 2 (two) times daily with a meal. Take 1 1/2 tablets twice daily         . Cyanocobalamin (VITAMIN B12 PO)   Oral   Take 1 tablet by mouth daily.          . fish oil-omega-3 fatty acids 1000 MG capsule   Oral   Take 1 g by mouth every morning.          . gabapentin (NEURONTIN) 300 MG capsule   Oral   Take 1  capsule (300 mg total) by mouth daily.   30 capsule   prn   . lisinopril (PRINIVIL,ZESTRIL) 10 MG tablet   Oral   Take 2 tablets (20 mg total) by mouth 2 (two) times daily.   180 tablet   3   . LORazepam (ATIVAN) 1 MG tablet   Oral   Take 0.5 tablets (0.5 mg total) by mouth every 8 (eight) hours as needed for anxiety. For anxiety   100 tablet   1   . metFORMIN (GLUCOPHAGE) 500 MG tablet   Oral   Take 500 mg by mouth daily with breakfast.         . rosuvastatin (CRESTOR) 10 MG tablet   Oral   Take 1 tablet (10 mg total) by mouth every morning.   30 tablet   5   . nitroGLYCERIN (NITROSTAT) 0.4 MG SL tablet   Sublingual   Place 0.4 mg under the tongue every 5 (five) minutes as needed for chest pain. x3 doses as needed for chest pain          BP 197/89  Pulse 73  Temp(Src) 97.3 F (36.3 C)  Resp 21  Ht 5\' 4"  (1.626 m)  Wt 155 lb (70.308 kg)  BMI 26.59 kg/m2  SpO2 97% Physical Exam  Nursing note and vitals reviewed. Constitutional: She appears well-developed and  well-nourished. No distress.  HENT:  Head: Normocephalic and atraumatic.  Mouth/Throat: Oropharynx is clear and moist. No oropharyngeal exudate.  Eyes: Conjunctivae and EOM are normal. Pupils are equal, round, and reactive to light. Right eye exhibits no discharge. Left eye exhibits no discharge. No scleral icterus.  Neck: Normal range of motion. Neck supple. No JVD present. No thyromegaly present.  Cardiovascular: Normal rate, regular rhythm, normal heart sounds and intact distal pulses.  Exam reveals no gallop and no friction rub.   No murmur heard. Pulmonary/Chest: Effort normal and breath sounds normal. No respiratory distress. She has no wheezes. She has no rales.  Abdominal: Soft. Bowel sounds are normal. She exhibits distension (Mild). She exhibits no mass. There is tenderness ( Minimal diffuse tenderness, no guarding). There is no rebound and no guarding.  Musculoskeletal: Normal range of motion. She exhibits no edema and no tenderness.  Lymphadenopathy:    She has no cervical adenopathy.  Neurological: She is alert. Coordination normal.  Skin: Skin is warm and dry. No rash noted. No erythema.  Psychiatric: She has a normal mood and affect. Her behavior is normal.    ED Course  Procedures (including critical care time) Labs Review Labs Reviewed  CBC WITH DIFFERENTIAL - Abnormal; Notable for the following:    WBC 11.9 (*)    HCT 35.2 (*)    Neutrophils Relative % 88 (*)    Neutro Abs 10.6 (*)    Lymphocytes Relative 8 (*)    All other components within normal limits  COMPREHENSIVE METABOLIC PANEL - Abnormal; Notable for the following:    Glucose, Bld 182 (*)    AST 45 (*)    ALT 70 (*)    Alkaline Phosphatase 295 (*)    Total Bilirubin 3.1 (*)    GFR calc non Af Amer 79 (*)    All other components within normal limits  LIPASE, BLOOD - Abnormal; Notable for the following:    Lipase 81 (*)    All other components within normal limits  POCT I-STAT, CHEM 8 - Abnormal;  Notable for the following:    Glucose, Bld 191 (*)  Hemoglobin 11.6 (*)    HCT 34.0 (*)    All other components within normal limits  URINALYSIS, ROUTINE W REFLEX MICROSCOPIC  CG4 I-STAT (LACTIC ACID)   Imaging Review No results found.  EKG Interpretation   None       MDM  No diagnosis found. Patient appears uncomfortable, she does have a soft abdomen with no specific guarding though she does state that it hurts in all quadrants. She has normal bowel sounds, normal heart and lung sounds. She is very hypertensive at this time, may be related to pain. Will start pain medications, nausea medications and labs including a lactic acid.  Pt reexamined at change of shift - she has ongoing pain - this is 8/10 at this time.  On exam she still has a soft abdomen with ttp across the mid abdomen slightly more tender in the RUQ.  Labs with slight elevation in LFT's and lipase, slight leukocytosis.  Change of shift - care signed out to Dr. Effie Shy.  Vida Roller, MD 09/12/13 [phone]

## 2013-09-12 NOTE — Consult Note (Signed)
CARDIOLOGY CONSULT NOTE     Patient ID: Jessica [name] MRN: [id number] DOB/AGE: [birth year] 79 y.o.  Admit date: 09/12/2013 Referring Physician Thad Ranger MD Primary Physician [name] MD Primary Cardiologist [name] MD Reason for Consultation pre op cardiac evaluation.  HPI: 79 year old white female admitted with retained common bile duct stone who is seen at the request of the hospitalist service for preoperative evaluation for ERCP and/or cholecystectomy. Patient has a history of hypertension, diabetes, hyperlipidemia, and sick sinus syndrome. She is status post pacemaker implant. She also has a history of coronary disease with remote placement of a DES in the mid and distal RCA in 2000. She was seen in our pacemaker clinic in September and had normal pacemaker function. Echocardiogram in September 2011 showed normal LV function. Patient presents on this occasion with abrupt onset of abdominal pain. This began in the epigastric area radiating to the right upper quadrant. This was associated with nausea and some distention. She denies any fever or chills. She had no vomiting. She denies any diarrhea. CT of the abdomen demonstrated dilatation of the intrahepatic and extrahepatic bile ducts with a filling defect in the distal common bile duct. Patient currently denies any significant chest pain, shortness of breath, or palpitations. She's had no recent syncope.  Review of systems complete and found to be negative unless listed above   Past Medical History  Diagnosis Date  . Hypertension   . Diabetes mellitus     NON INSULIN DEPENDENT  . Hypercholesterolemia   . DJD (degenerative joint disease)   . History of diabetic neuropathy   . Chest pain   . DOE (dyspnea on exertion)   . IHD (ischemic heart disease)   . SSS (sick sinus syndrome)     s/p PPM by JA for SSS and syncope 9/11  . Breast cancer   . GERD (gastroesophageal reflux disease)   . Hemorrhoids   . CAD  (coronary artery disease)     Family History  Problem Relation Age of Onset  . Cancer Mother   . Cancer Father     History   Social History  . Marital Status: Widowed    Spouse Name: N/A    Number of Children: N/A  . Years of Education: N/A   Occupational History  . Not on file.   Social History Main Topics  . Smoking status: Never Smoker   . Smokeless tobacco: Not on file  . Alcohol Use: No  . Drug Use: No  . Sexual Activity:    Other Topics Concern  . Not on file   Social History Narrative  . No narrative on file    Past Surgical History  Procedure Laterality Date  . Cardiac catheterization  07/05/2009    EF 55-60%  . Insert / replace / remove pacemaker  9/11    SSS and syncope, implanted by JA (MDT)  . Mastectomy  1992    BILATERAL WITH RECONSTRUCTION  . Colonoscopy       Prescriptions prior to admission  Medication Sig Dispense Refill  . aspirin 81 MG tablet Take 81 mg by mouth every morning.       . Calcium Carbonate-Vitamin D (CALCIUM + D PO) Take 1 tablet by mouth every morning.       . carvedilol (COREG) 12.5 MG tablet Take 18.75 mg by mouth 2 (two) times daily with a meal. Take 1 1/2 tablets twice daily      . Cyanocobalamin (VITAMIN B12 PO) Take 1  tablet by mouth daily.       . fish oil-omega-3 fatty acids 1000 MG capsule Take 1 g by mouth every morning.       . gabapentin (NEURONTIN) 300 MG capsule Take 1 capsule (300 mg total) by mouth daily.  30 capsule  prn  . lisinopril (PRINIVIL,ZESTRIL) 10 MG tablet Take 2 tablets (20 mg total) by mouth 2 (two) times daily.  180 tablet  3  . LORazepam (ATIVAN) 1 MG tablet Take 0.5 tablets (0.5 mg total) by mouth every 8 (eight) hours as needed for anxiety. For anxiety  100 tablet  1  . metFORMIN (GLUCOPHAGE) 500 MG tablet Take 500 mg by mouth daily with breakfast.      . rosuvastatin (CRESTOR) 10 MG tablet Take 1 tablet (10 mg total) by mouth every morning.  30 tablet  5  . nitroGLYCERIN (NITROSTAT) 0.4 MG SL  tablet Place 0.4 mg under the tongue every 5 (five) minutes as needed for chest pain. x3 doses as needed for chest pain        Physical Exam: Blood pressure 163/75, pulse 70, temperature 98.1 F (36.7 C), temperature source Oral, resp. rate 22, height 5\' 4"  (1.626 m), weight 154 lb (69.854 kg), SpO2 96.00%. Patient is an elderly white female who appears to be in no acute distress. HEENT: Normocephalic, atraumatic. Pupils equal round and reactive light accommodation. Extraocular movements are full. Oropharynx is clear. Neck: No JVD, bruits, adenopathy, or thyromegaly. Lungs: Clear Cardiovascular: Regular rate and rhythm. Normal S1 and S2. No gallop, murmur, or click. Abdomen: Mildly distended, soft, with mild chronic quadrant tenderness to palpation. No rebound or guarding. Sounds are positive. Extremities: No cyanosis or edema. Pedal pulses are 2+ and symmetric. Neuro: Alert and oriented x3. Cranial nerves II through XII are intact. Skin: Warm and dry Labs:   Lab Results  Component Value Date   WBC 11.9* 09/12/2013   HGB 11.6* 09/12/2013   HCT 34.0* 09/12/2013   MCV 86.5 09/12/2013   PLT 258 09/12/2013    Recent Labs Lab 09/12/13 0515 09/12/13 0520  NA 136 139  K 3.5 3.7  CL 100 102  CO2 23  --   BUN 15 15  CREATININE 0.66 0.90  CALCIUM 9.6  --   PROT 7.3  --   BILITOT 3.1*  --   ALKPHOS 295*  --   ALT 70*  --   AST 45*  --   GLUCOSE 182* 191*   Lab Results  Component Value Date   CKTOTAL 55 02/10/2013   CKMB 1.4 07/15/2010   TROPONINI <0.30 02/11/2013    Lab Results  Component Value Date   CHOL 200 07/06/2013   CHOL 130 10/21/2012   CHOL 117 06/09/2012   Lab Results  Component Value Date   HDL 46.70 07/06/2013   HDL 52.00 10/21/2012   HDL 52.50 06/09/2012   Lab Results  Component Value Date   LDLCALC 137* 07/06/2013   LDLCALC 62 10/21/2012   LDLCALC 52 06/09/2012   Lab Results  Component Value Date   TRIG 82.0 07/06/2013   TRIG 80.0 10/21/2012   TRIG 62.0  06/09/2012   Lab Results  Component Value Date   CHOLHDL 4 07/06/2013   CHOLHDL 3 10/21/2012   CHOLHDL 2 06/09/2012   No results found for this basename: LDLDIRECT      Radiology:CT ABDOMEN AND PELVIS WITH CONTRAST  TECHNIQUE:  Multidetector CT imaging of the abdomen and pelvis was performed  using the standard  protocol following bolus administration of  intravenous contrast.  CONTRAST: 80mL OMNIPAQUE IOHEXOL 300 MG/ML SOLN  COMPARISON: None  FINDINGS:  Visualization of the lower thorax demonstrates small, right greater  than left, pleural effusions. Bilateral lower lung ground-glass  opacities likely represent atelectasis. Heart is enlarged.  The liver is heterogeneous in attenuation. There is relative  increased enhancement of the liver adjacent to the gallbladder.  Additionally there is suggestion of a small amount of fluid  insinuated between the gallbladder and the right hepatic lobe.  Layering high attenuation material within the gallbladder lumen.  Marked intrahepatic and extrahepatic biliary ductal dilatation with  the common bile duct measuring up to 1.5 cm. Within the distal  aspect of the common bile duct there are high attenuation filling  defects measuring approximately 3.5 cm proximal to the ampulla.  Spleen is normal in size. There are 2 low-attenuation lesions within  the spleen measuring 1.5 and 1.4 cm respectively. Mild atrophy of  the pancreatic parenchyma. Mild thickening of the right adrenal  gland. The left adrenal gland is grossly unremarkable. Kidneys  enhance symmetrically with contrast. No hydronephrosis.  Normal caliber abdominal aorta with scattered calcified  atherosclerotic plaque. Urinary bladder is unremarkable. Extensive  descending and sigmoid colonic diverticulosis without CT evidence  for acute diverticulitis. No abnormal bowel wall thickening. No  evidence for bowel obstruction. No free fluid or free  intraperitoneal air. Surgical clips within  the anterior lower  abdominal wall.  Lower lumbar spine degenerative change. No aggressive appearing  osseous lesions.  IMPRESSION:  1. Marked intrahepatic and extrahepatic biliary ductal dilatation  with the common bile duct measuring up to 1.5 cm. There are multiple  high density filling defects demonstrated within the distal aspect  of the common bile duct, raising the possibility of  choledocholithiasis. Intraductal mass lesion is an alternative  possibility. Further evaluation with ERCP is recommended.  2. Cholelithiasis. Relative increased enhancement of the liver  parenchyma adjacent to the gallbladder fossa with a small amount of  fluid versus dilated duct insinuated between the gallbladder and the  liver. Further evaluation/characterization of the gallbladder and  associated hepatic parenchyma can be performed with gallbladder  ultrasound as clinically indicated.  3. Small right greater than left bilateral pleural effusions with  underlying atelectasis.  4. Two adjacent low attenuation splenic lesions as above. While  these are likely benign (cyst, hemangioma) they demonstrate  nonspecific imaging features and a followup MRI is recommended in 6  months to ensure stability.  Electronically Signed  By: Annia Belt M.D.  On: 09/12/2013 08:32  EKG: pending   ASSESSMENT AND PLAN:  1. Cholelithiasis with probable retained common bile duct stone. 2. Sick sinus syndrome status post DDD pacemaker implant. 3. Coronary disease with remote stenting of the mid and distal RCA in 2000. Patient is asymptomatic. 4. Hypertension-controlled 5. Hyperlipidemia on chronic statin therapy.  Plan: The patient is cleared for ERCP and/or cholecystectomy from a cardiac standpoint. We will update her ECG which is chronically abnormal. I would recommend continuing her home cardiac medications.  SignedTheron Arista [facility] 09/12/2013, 2:49 PM

## 2013-09-12 NOTE — ED Notes (Signed)
Patient presents to ED via [facility]. Pt c/o of diffuse lower abdominal pain that started last night at approximately 2200. Pt states, "I feel like if I could burp then I would feel better but I cant." Pt denies any pain with palpation, no guarding noted. Bowel sounds are audible. Pt states that she has been having normal bowel movements- LBM being yesterday. 22g to left hand placed by [facility]. A&Ox4 upon arrival to ED.

## 2013-09-12 NOTE — H&P (Signed)
History and Physical       Hospital Admission Note Date: 09/12/2013  Patient name: Jessica [name] Medical record number: [id number] Date of birth: [birth year] Age: 79 y.o. Gender: female PCP: Jessica Clement, MD    Chief Complaint:  Abdominal pain since yesterday  HPI: Patient is 79 year old female with history of hypertension, diabetes, hyperlipidemia, SSS s/p pacemaker, coronary disease presented to ED with abdominal pain since yesterday. History was obtained from the patient who stated that she was in normal state of health until yesterday evening when she started developing acute onset of abdominal pain associated with nausea and abdominal distention. She denied any vomiting, fevers or chills, any diarrhea, or medication melena. She did describe intermittent dysuria but none today, no hematuria.  CT abdomen in the ER showed marked intrahepatic and extrahepatic dilatation and CBD 1.5 cm, multiple high-density filling defects in distal aspect of CBD raising the possibility of choledocholithiasis or possible intraductal mass lesion, recommend ERCP. Also cholelithiasis. General surgery was consulted in ED, who recommended ERCP and obtain abdominal ultrasound for further workup, may have future cholecystectomy.   Review of Systems:  Constitutional: Denies fever, chills, diaphoresis, poor appetite and fatigue.  HEENT: Denies photophobia, eye pain, redness, hearing loss, ear pain, congestion, sore throat, rhinorrhea, sneezing, mouth sores, trouble swallowing, neck pain, neck stiffness and tinnitus.   Respiratory: Denies SOB, DOE, cough, chest tightness,  and wheezing.   Cardiovascular: Denies chest pain, palpitations and leg swelling.  Gastrointestinal:  please see history of present illness  Genitourinary: Denies urgency, frequency, hematuria, flank pain and difficulty urinating. , Intermittent dysuria  Musculoskeletal: Denies myalgias,  back pain, joint swelling, arthralgias and gait problem.  Skin: Denies pallor, rash and wound.  Neurological: Denies dizziness, seizures, syncope, weakness, light-headedness, numbness and headaches.  Hematological: Denies adenopathy. Easy bruising, personal or family bleeding history  Psychiatric/Behavioral: Denies suicidal ideation, mood changes, confusion, nervousness, sleep disturbance and agitation  Past Medical History: Past Medical History  Diagnosis Date  . Hypertension   . Diabetes mellitus     NON INSULIN DEPENDENT  . Hypercholesterolemia   . DJD (degenerative joint disease)   . History of diabetic neuropathy   . Chest pain   . DOE (dyspnea on exertion)   . IHD (ischemic heart disease)   . SSS (sick sinus syndrome)     s/p PPM by JA for SSS and syncope 9/11  . Breast cancer   . GERD (gastroesophageal reflux disease)   . Hemorrhoids   . CAD (coronary artery disease)    Past Surgical History  Procedure Laterality Date  . Cardiac catheterization  07/05/2009    EF 55-60%  . Insert / replace / remove pacemaker  9/11    SSS and syncope, implanted by JA (MDT)  . Mastectomy  1992    BILATERAL WITH RECONSTRUCTION  . Colonoscopy      Medications: Prior to Admission medications   Medication Sig Start Date End Date Taking? Authorizing Provider  aspirin 81 MG tablet Take 81 mg by mouth every morning.    Yes Historical Provider, MD  Calcium Carbonate-Vitamin D (CALCIUM + D PO) Take 1 tablet by mouth every morning.    Yes Historical Provider, MD  carvedilol (COREG) 12.5 MG tablet Take 18.75 mg by mouth 2 (two) times daily with a meal. Take 1 1/2 tablets twice daily 07/13/13  Yes Hillis Range, MD  Cyanocobalamin (VITAMIN B12 PO) Take 1 tablet by mouth daily.    Yes Historical Provider, MD  fish oil-omega-3 fatty  acids 1000 MG capsule Take 1 g by mouth every morning.    Yes Historical Provider, MD  gabapentin (NEURONTIN) 300 MG capsule Take 1 capsule (300 mg total) by mouth daily.  07/06/13  Yes Jessica Clement, MD  lisinopril (PRINIVIL,ZESTRIL) 10 MG tablet Take 2 tablets (20 mg total) by mouth 2 (two) times daily. 03/15/13  Yes Jessica Clement, MD  LORazepam (ATIVAN) 1 MG tablet Take 0.5 tablets (0.5 mg total) by mouth every 8 (eight) hours as needed for anxiety. For anxiety 03/25/13  Yes Jessica Clement, MD  metFORMIN (GLUCOPHAGE) 500 MG tablet Take 500 mg by mouth daily with breakfast.   Yes Historical Provider, MD  rosuvastatin (CRESTOR) 10 MG tablet Take 1 tablet (10 mg total) by mouth every morning. 07/08/13  Yes Jessica Clement, MD  nitroGLYCERIN (NITROSTAT) 0.4 MG SL tablet Place 0.4 mg under the tongue every 5 (five) minutes as needed for chest pain. x3 doses as needed for chest pain 04/16/11   Jessica Clement, MD    Allergies:   Allergies  Allergen Reactions  . Lyrica [Pregabalin] Swelling    wgt gain  . Sulfonamide Derivatives Rash    Social History:  reports that she has never smoked. She does not have any smokeless tobacco history on file. She reports that she does not drink alcohol or use illicit drugs.She lives at home with her daughter, ambulates without any assistance  Family History: Family History  Problem Relation Age of Onset  . Cancer Mother   . Cancer Father     Physical Exam: Blood pressure 188/76, pulse 70, temperature 97.2 F (36.2 C), temperature source Oral, resp. rate 33, height 5\' 4"  (1.626 m), weight 70.308 kg (155 lb), SpO2 95.00%. General: Alert, awake, oriented x3, in no acute distress. HEENT: normocephalic, atraumatic, anicteric sclera, pink conjunctiva, pupils equal and reactive to light and accomodation, oropharynx clear Neck: supple, no masses or lymphadenopathy, no goiter, no bruits  Heart: Regular rate and rhythm, without murmurs, rubs or gallops. Lungs: Clear to auscultation bilaterally, no wheezing, rales or rhonchi. Abdomen: Soft, very minimal diffuse tenderness, abdominal distention, no rebound or guarding   extremities: No clubbing, cyanosis or edema with positive pedal pulses. Neuro: Grossly intact, no focal neurological deficits, strength 5/5 upper and lower extremities bilaterally Psych: alert and oriented x 3, normal mood and affect Skin: no rashes or lesions, warm and dry   LABS on Admission:  Basic Metabolic Panel:  Recent Labs Lab 09/12/13 0515 09/12/13 0520  NA 136 139  K 3.5 3.7  CL 100 102  CO2 23  --   GLUCOSE 182* 191*  BUN 15 15  CREATININE 0.66 0.90  CALCIUM 9.6  --    Liver Function Tests:  Recent Labs Lab 09/12/13 0515  AST 45*  ALT 70*  ALKPHOS 295*  BILITOT 3.1*  PROT 7.3  ALBUMIN 3.6    Recent Labs Lab 09/12/13 0515  LIPASE 81*   No results found for this basename: AMMONIA,  in the last 168 hours CBC:  Recent Labs Lab 09/12/13 0515 09/12/13 0520  WBC 11.9*  --   NEUTROABS 10.6*  --   HGB 12.2 11.6*  HCT 35.2* 34.0*  MCV 86.5  --   PLT 258  --    Cardiac Enzymes: No results found for this basename: CKTOTAL, CKMB, CKMBINDEX, TROPONINI,  in the last 168 hours BNP: No components found with this basename: POCBNP,  CBG: No results found for this basename: GLUCAP,  in the last 168 hours  Radiological Exams on Admission: Ct Abdomen Pelvis W Contrast  09/12/2013   CLINICAL DATA:  Diffuse lower abdominal pain.  EXAM: CT ABDOMEN AND PELVIS WITH CONTRAST  TECHNIQUE: Multidetector CT imaging of the abdomen and pelvis was performed using the standard protocol following bolus administration of intravenous contrast.  CONTRAST:  80mL OMNIPAQUE IOHEXOL 300 MG/ML  SOLN  COMPARISON:  None  FINDINGS: Visualization of the lower thorax demonstrates small, right greater than left, pleural effusions. Bilateral lower lung ground-glass opacities likely represent atelectasis. Heart is enlarged.  The liver is heterogeneous in attenuation. There is relative increased enhancement of the liver adjacent to the gallbladder. Additionally there is suggestion of a small  amount of fluid insinuated between the gallbladder and the right hepatic lobe. Layering high attenuation material within the gallbladder lumen. Marked intrahepatic and extrahepatic biliary ductal dilatation with the common bile duct measuring up to 1.5 cm. Within the distal aspect of the common bile duct there are high attenuation filling defects measuring approximately 3.5 cm proximal to the ampulla.  Spleen is normal in size. There are 2 low-attenuation lesions within the spleen measuring 1.5 and 1.4 cm respectively. Mild atrophy of the pancreatic parenchyma. Mild thickening of the right adrenal gland. The left adrenal gland is grossly unremarkable. Kidneys enhance symmetrically with contrast. No hydronephrosis.  Normal caliber abdominal aorta with scattered calcified atherosclerotic plaque. Urinary bladder is unremarkable. Extensive descending and sigmoid colonic diverticulosis without CT evidence for acute diverticulitis. No abnormal bowel wall thickening. No evidence for bowel obstruction. No free fluid or free intraperitoneal air. Surgical clips within the anterior lower abdominal wall.  Lower lumbar spine degenerative change. No aggressive appearing osseous lesions.  IMPRESSION: 1. Marked intrahepatic and extrahepatic biliary ductal dilatation with the common bile duct measuring up to 1.5 cm. There are multiple high density filling defects demonstrated within the distal aspect of the common bile duct, raising the possibility of choledocholithiasis. Intraductal mass lesion is an alternative possibility. Further evaluation with ERCP is recommended. 2. Cholelithiasis. Relative increased enhancement of the liver parenchyma adjacent to the gallbladder fossa with a small amount of fluid versus dilated duct insinuated between the gallbladder and the liver. Further evaluation/characterization of the gallbladder and associated hepatic parenchyma can be performed with gallbladder ultrasound as clinically indicated. 3.  Small right greater than left bilateral pleural effusions with underlying atelectasis. 4. Two adjacent low attenuation splenic lesions as above. While these are likely benign (cyst, hemangioma) they demonstrate nonspecific imaging features and a followup MRI is recommended in 6 months to ensure stability.   Electronically Signed   By: Annia Belt M.D.   On: 09/12/2013 08:32    Assessment/Plan Principal Problem:   Abdominal pain likely due to cholelithiasis/choledocholithiasis rule out any intraductal mass lesion, lipase is slightly high with transaminitis: Significantly improved   -  patient will be admitted for further workup, general surgery recommends abdominal ultrasound and GI consultation with cardiology to clear the patient for the procedures. She may need also cholecystectomy - Abdominal ultrasound ordered, GI consult called, d/w Jennye Moccasin ([facility])   - Will continue n.p.o. Status, IV fluids, pain control, and antiemetics - Cardiology also consulted for clearance.   Active Problems:   DM -  Placed on sliding scale insulin      HYPERCHOLESTEROLEMIA - Hold Crestor and omega-3 fatty acids due to transaminitis    CAD: Currently stable, no chest pain or shortness of breath  - Continue aspirin Coreg, lisinopril, hold statin     Pacemaker-[employer]  Hypertension:  - Currently elevated likely due to abdominal pain and patient did not have her regular antihypertensives yet today  - Will give one dose of IV hydralazine 10 mg, restart her Coreg and lisinopril  DVT prophylaxis:  SCDs   CODE STATUS:  Family Communication: Admission, patients condition and plan of care including tests being ordered have been discussed with the patient who indicates understanding and agree with the plan and Code Status   Further plan will depend as patient's clinical course evolves and further radiologic and laboratory data become available.   Time Spent on Admission: 1 hour  Evynn Boutelle  M.D. [facility] 09/12/2013, 11:41 AM Pager: [phone]  If 7PM-7AM, please contact night-coverage [email or web address] Password [email or web address]

## 2013-09-12 NOTE — ED Notes (Signed)
CT to room with contrast.

## 2013-09-12 NOTE — Consult Note (Signed)
Pt seen  Needs medical workup and U/S.  D/w pt and daughter.

## 2013-09-12 NOTE — ED Provider Notes (Signed)
Evaluation post-return of CT imaging  Medications  0.9 %  sodium chloride infusion ( Intravenous Duplicate 09/12/13 1230)  carvedilol (COREG) tablet 18.75 mg (not administered)  LORazepam (ATIVAN) tablet 0.5 mg (not administered)  lisinopril (PRINIVIL,ZESTRIL) tablet 20 mg (20 mg Oral Given 09/12/13 1646)  nitroGLYCERIN (NITROSTAT) SL tablet 0.4 mg (not administered)  acetaminophen (TYLENOL) tablet 650 mg (not administered)    Or  acetaminophen (TYLENOL) suppository 650 mg (not administered)  HYDROcodone-acetaminophen (NORCO/VICODIN) 5-325 MG per tablet 1 tablet (not administered)  HYDROmorphone (DILAUDID) injection 1 mg (not administered)  ondansetron (ZOFRAN) tablet 4 mg (not administered)    Or  ondansetron (ZOFRAN) injection 4 mg (not administered)  sodium chloride 0.9 % injection 3 mL (3 mLs Intravenous Not Given 09/12/13 1230)  0.9 %  sodium chloride infusion ( Intravenous New Bag/Given 09/12/13 1237)  insulin aspart (novoLOG) injection 0-5 Units (not administered)  insulin aspart (novoLOG) injection 0-9 Units (not administered)  aspirin EC tablet 81 mg (81 mg Oral Given 09/12/13 1646)  0.9 %  sodium chloride infusion ( Intravenous New Bag/Given 09/12/13 0512)  ondansetron (ZOFRAN) injection 4 mg (4 mg Intravenous Given 09/12/13 0512)  morphine 4 MG/ML injection 2 mg (2 mg Intravenous Given 09/12/13 0511)  ondansetron (ZOFRAN) injection 4 mg (4 mg Intravenous Given 09/12/13 0717)  fentaNYL (SUBLIMAZE) injection 50 mcg (50 mcg Intravenous Given 09/12/13 0718)  iohexol (OMNIPAQUE) 300 MG/ML solution 80 mL (80 mLs Intravenous Contrast Given 09/12/13 0732)  hydrALAZINE (APRESOLINE) injection 10 mg (10 mg Intravenous Given 09/12/13 1241)   Patient Vitals for the past 24 hrs:  BP Temp Temp src Pulse Resp SpO2 Height Weight  09/12/13 1227 163/75 mmHg 98.1 F (36.7 C) Oral - 22 96 % 5\' 4"  (1.626 m) 154 lb (69.854 kg)  09/12/13 1107 188/76 mmHg - - 70 33 95 % - -  09/12/13 1015 190/79  mmHg - - 68 24 98 % - -  09/12/13 0930 188/80 mmHg - - 71 24 97 % - -  09/12/13 0845 180/77 mmHg - - 73 24 96 % - -  09/12/13 0843 171/69 mmHg - - 74 24 96 % - -  09/12/13 0803 - - - 75 24 95 % - -  09/12/13 0802 - - - 75 27 95 % - -  09/12/13 0801 - - - 75 22 92 % - -  09/12/13 0800 171/69 mmHg - - 75 25 90 % - -  09/12/13 0759 - - - 75 25 85 % - -  09/12/13 0751 174/74 mmHg - - 76 22 94 % - -  09/12/13 0715 119/87 mmHg 97.2 F (36.2 C) Oral 73 22 97 % - -  09/12/13 0630 197/89 mmHg - - 73 21 97 % - -  09/12/13 0615 196/84 mmHg - - 73 21 95 % - -  09/12/13 0600 196/78 mmHg - - 73 20 97 % - -  09/12/13 0515 200/83 mmHg - - 75 21 94 % - -  09/12/13 0500 201/88 mmHg - - 74 14 96 % - -  09/12/13 0450 199/84 mmHg 97.3 F (36.3 C) - 74 24 97 % - -  09/12/13 0445 - - - - - - 5\' 4"  (1.626 m) 155 lb (70.308 kg)   Ct Abdomen Pelvis W Contrast  09/12/2013   CLINICAL DATA:  Diffuse lower abdominal pain.  EXAM: CT ABDOMEN AND PELVIS WITH CONTRAST  TECHNIQUE: Multidetector CT imaging of the abdomen and pelvis was performed using the standard protocol following  bolus administration of intravenous contrast.  CONTRAST:  80mL OMNIPAQUE IOHEXOL 300 MG/ML  SOLN  COMPARISON:  None  FINDINGS: Visualization of the lower thorax demonstrates small, right greater than left, pleural effusions. Bilateral lower lung ground-glass opacities likely represent atelectasis. Heart is enlarged.  The liver is heterogeneous in attenuation. There is relative increased enhancement of the liver adjacent to the gallbladder. Additionally there is suggestion of a small amount of fluid insinuated between the gallbladder and the right hepatic lobe. Layering high attenuation material within the gallbladder lumen. Marked intrahepatic and extrahepatic biliary ductal dilatation with the common bile duct measuring up to 1.5 cm. Within the distal aspect of the common bile duct there are high attenuation filling defects measuring approximately  3.5 cm proximal to the ampulla.  Spleen is normal in size. There are 2 low-attenuation lesions within the spleen measuring 1.5 and 1.4 cm respectively. Mild atrophy of the pancreatic parenchyma. Mild thickening of the right adrenal gland. The left adrenal gland is grossly unremarkable. Kidneys enhance symmetrically with contrast. No hydronephrosis.  Normal caliber abdominal aorta with scattered calcified atherosclerotic plaque. Urinary bladder is unremarkable. Extensive descending and sigmoid colonic diverticulosis without CT evidence for acute diverticulitis. No abnormal bowel wall thickening. No evidence for bowel obstruction. No free fluid or free intraperitoneal air. Surgical clips within the anterior lower abdominal wall.  Lower lumbar spine degenerative change. No aggressive appearing osseous lesions.  IMPRESSION: 1. Marked intrahepatic and extrahepatic biliary ductal dilatation with the common bile duct measuring up to 1.5 cm. There are multiple high density filling defects demonstrated within the distal aspect of the common bile duct, raising the possibility of choledocholithiasis. Intraductal mass lesion is an alternative possibility. Further evaluation with ERCP is recommended. 2. Cholelithiasis. Relative increased enhancement of the liver parenchyma adjacent to the gallbladder fossa with a small amount of fluid versus dilated duct insinuated between the gallbladder and the liver. Further evaluation/characterization of the gallbladder and associated hepatic parenchyma can be performed with gallbladder ultrasound as clinically indicated. 3. Small right greater than left bilateral pleural effusions with underlying atelectasis. 4. Two adjacent low attenuation splenic lesions as above. While these are likely benign (cyst, hemangioma) they demonstrate nonspecific imaging features and a followup MRI is recommended in 6 months to ensure stability.   Electronically Signed   By: Annia Belt M.D.   On: 09/12/2013  08:32     Consultation- [facility] will see as a [facility] Consultation- gastroenterology, they will see as a consultant   Diagnoses that have been ruled out:  None  Diagnoses that are still under consideration:  None  Final diagnoses:  Gall bladder disease  Abdominal pain    Plan: Admit  Flint Melter, MD 09/12/13 1651

## 2013-09-12 NOTE — ED Notes (Signed)
MD at bedside. 

## 2013-09-12 NOTE — Consult Note (Signed)
Jessica [name] 11-Aug-1927  [id number].   Primary Care MD:  Requesting MD: Gray Bernhardt Chief Complaint/Reason for Consult: Abdominal pain HPI: Ms. Jessica [name] is an 79 year old female who presents to the ED following abdominal pain early this morning. She describes upper abdominal pain bilaterally that radiates to the flank. She denies having any pain like this before. Her pain is well controlled with morphine. She has not had pain following ingestion of certain foods. She has had some nausea but no vomiting. Her last bowel movement was one day ago. She has a PMH of sick sinus syndrome with a pacemaker and sees her cardiologist every six months.   ROS Admits to urinary retention and chills. Denies chest pain, dyspnea, fever, and dysuria.   Family History  Problem Relation Age of Onset  . Cancer Mother   . Cancer Father     Past Medical History  Diagnosis Date  . Hypertension   . Diabetes mellitus     NON INSULIN DEPENDENT  . Hypercholesterolemia   . DJD (degenerative joint disease)   . History of diabetic neuropathy   . Chest pain   . DOE (dyspnea on exertion)   . IHD (ischemic heart disease)   . SSS (sick sinus syndrome)     s/p PPM by [facility] for SSS and syncope 9/11  . Breast cancer   . GERD (gastroesophageal reflux disease)   . Hemorrhoids   . CAD (coronary artery disease)     Past Surgical History  Procedure Laterality Date  . Cardiac catheterization  07/05/2009    EF 55-60%  . Insert / replace / remove pacemaker  9/11    SSS and syncope, implanted by [facility] (MDT)  . Mastectomy  1992    BILATERAL WITH RECONSTRUCTION  . Colonoscopy      Social History:  reports that she has never smoked. She does not have any smokeless tobacco history on file. She reports that she does not drink alcohol or use illicit drugs.  Allergies:  Allergies  Allergen Reactions  . Lyrica [Pregabalin] Swelling    wgt gain  . Sulfonamide Derivatives Rash     (Not in a hospital admission)  Blood  pressure 188/80, pulse 71, temperature 97.2 F (36.2 C), temperature source Oral, resp. rate 24, height 5\' 4"  (1.626 m), weight 70.308 kg (155 lb), SpO2 97.00%. Physical Exam: Constitutional: alert and oriented x 3 in no distress Cardio: regular rate and rhythm Pulmonary: clear to auscultation bilaterally GI: +BS, soft, non tender to palpation. Mild distension noted, no guarding. Negative Murphy's sign.  Skin: Warm and dry.    Results for orders placed during the hospital encounter of 09/12/13 (from the past 48 hour(s))  CBC WITH DIFFERENTIAL     Status: Abnormal   Collection Time    09/12/13  5:15 AM      Result Value Range   WBC 11.9 (*) 4.0 - 10.5 K/uL   RBC 4.07  3.87 - 5.11 MIL/uL   Hemoglobin 12.2  12.0 - 15.0 g/dL   HCT 40.9 (*) 81.1 - 91.4 %   MCV 86.5  78.0 - 100.0 fL   MCH 30.0  26.0 - 34.0 pg   MCHC 34.7  30.0 - 36.0 g/dL   RDW 78.2  95.6 - 21.3 %   Platelets 258  150 - 400 K/uL   Neutrophils Relative % 88 (*) 43 - 77 %   Neutro Abs 10.6 (*) 1.7 - 7.7 K/uL   Lymphocytes Relative 8 (*) 12 -  46 %   Lymphs Abs 1.0  0.7 - 4.0 K/uL   Monocytes Relative 3  3 - 12 %   Monocytes Absolute 0.4  0.1 - 1.0 K/uL   Eosinophils Relative 0  0 - 5 %   Eosinophils Absolute 0.0  0.0 - 0.7 K/uL   Basophils Relative 0  0 - 1 %   Basophils Absolute 0.0  0.0 - 0.1 K/uL  COMPREHENSIVE METABOLIC PANEL     Status: Abnormal   Collection Time    09/12/13  5:15 AM      Result Value Range   Sodium 136  135 - 145 mEq/L   Potassium 3.5  3.5 - 5.1 mEq/L   Chloride 100  96 - 112 mEq/L   CO2 23  19 - 32 mEq/L   Glucose, Bld 182 (*) 70 - 99 mg/dL   BUN 15  6 - 23 mg/dL   Creatinine, Ser 1.61  0.50 - 1.10 mg/dL   Calcium 9.6  8.4 - 09.6 mg/dL   Total Protein 7.3  6.0 - 8.3 g/dL   Albumin 3.6  3.5 - 5.2 g/dL   AST 45 (*) 0 - 37 U/L   ALT 70 (*) 0 - 35 U/L   Alkaline Phosphatase 295 (*) 39 - 117 U/L   Total Bilirubin 3.1 (*) 0.3 - 1.2 mg/dL   GFR calc non Af Amer 79 (*) >90 mL/min   GFR calc  Af Amer >90  >90 mL/min   Comment: (NOTE)     The eGFR has been calculated using the CKD EPI equation.     This calculation has not been validated in all clinical situations.     eGFR's persistently <90 mL/min signify possible Chronic Kidney     Disease.  LIPASE, BLOOD     Status: Abnormal   Collection Time    09/12/13  5:15 AM      Result Value Range   Lipase 81 (*) 11 - 59 U/L  POCT I-STAT, CHEM 8     Status: Abnormal   Collection Time    09/12/13  5:20 AM      Result Value Range   Sodium 139  135 - 145 mEq/L   Potassium 3.7  3.5 - 5.1 mEq/L   Chloride 102  96 - 112 mEq/L   BUN 15  6 - 23 mg/dL   Creatinine, Ser 0.45  0.50 - 1.10 mg/dL   Glucose, Bld 409 (*) 70 - 99 mg/dL   Calcium, Ion 8.11  9.14 - 1.30 mmol/L   TCO2 21  0 - 100 mmol/L   Hemoglobin 11.6 (*) 12.0 - 15.0 g/dL   HCT 78.2 (*) 95.6 - 21.3 %  CG4 I-STAT (LACTIC ACID)     Status: None   Collection Time    09/12/13  5:21 AM      Result Value Range   Lactic Acid, Venous 1.02  0.5 - 2.2 mmol/L  URINALYSIS, ROUTINE W REFLEX MICROSCOPIC     Status: Abnormal   Collection Time    09/12/13  6:39 AM      Result Value Range   Color, Urine YELLOW  YELLOW   APPearance CLOUDY (*) CLEAR   Specific Gravity, Urine 1.014  1.005 - 1.030   pH 7.5  5.0 - 8.0   Glucose, UA 100 (*) NEGATIVE mg/dL   Hgb urine dipstick NEGATIVE  NEGATIVE   Bilirubin Urine NEGATIVE  NEGATIVE   Ketones, ur 15 (*) NEGATIVE mg/dL   Protein,  ur 30 (*) NEGATIVE mg/dL   Urobilinogen, UA 1.0  0.0 - 1.0 mg/dL   Nitrite NEGATIVE  NEGATIVE   Leukocytes, UA TRACE (*) NEGATIVE  URINE MICROSCOPIC-ADD ON     Status: Abnormal   Collection Time    09/12/13  6:39 AM      Result Value Range   Squamous Epithelial / LPF RARE  RARE   WBC, UA 3-6  <3 WBC/hpf   RBC / HPF 0-2  <3 RBC/hpf   Bacteria, UA FEW (*) RARE   Urine-Other AMORPHOUS URATES/PHOSPHATES     Ct Abdomen Pelvis W Contrast  09/12/2013   CLINICAL DATA:  Diffuse lower abdominal pain.  EXAM: CT  ABDOMEN AND PELVIS WITH CONTRAST  TECHNIQUE: Multidetector CT imaging of the abdomen and pelvis was performed using the standard protocol following bolus administration of intravenous contrast.  CONTRAST:  80mL OMNIPAQUE IOHEXOL 300 MG/ML  SOLN  COMPARISON:  None  FINDINGS: Visualization of the lower thorax demonstrates small, right greater than left, pleural effusions. Bilateral lower lung ground-glass opacities likely represent atelectasis. Heart is enlarged.  The liver is heterogeneous in attenuation. There is relative increased enhancement of the liver adjacent to the gallbladder. Additionally there is suggestion of a small amount of fluid insinuated between the gallbladder and the right hepatic lobe. Layering high attenuation material within the gallbladder lumen. Marked intrahepatic and extrahepatic biliary ductal dilatation with the common bile duct measuring up to 1.5 cm. Within the distal aspect of the common bile duct there are high attenuation filling defects measuring approximately 3.5 cm proximal to the ampulla.  Spleen is normal in size. There are 2 low-attenuation lesions within the spleen measuring 1.5 and 1.4 cm respectively. Mild atrophy of the pancreatic parenchyma. Mild thickening of the right adrenal gland. The left adrenal gland is grossly unremarkable. Kidneys enhance symmetrically with contrast. No hydronephrosis.  Normal caliber abdominal aorta with scattered calcified atherosclerotic plaque. Urinary bladder is unremarkable. Extensive descending and sigmoid colonic diverticulosis without CT evidence for acute diverticulitis. No abnormal bowel wall thickening. No evidence for bowel obstruction. No free fluid or free intraperitoneal air. Surgical clips within the anterior lower abdominal wall.  Lower lumbar spine degenerative change. No aggressive appearing osseous lesions.  IMPRESSION: 1. Marked intrahepatic and extrahepatic biliary ductal dilatation with the common bile duct measuring up to  1.5 cm. There are multiple high density filling defects demonstrated within the distal aspect of the common bile duct, raising the possibility of choledocholithiasis. Intraductal mass lesion is an alternative possibility. Further evaluation with ERCP is recommended. 2. Cholelithiasis. Relative increased enhancement of the liver parenchyma adjacent to the gallbladder fossa with a small amount of fluid versus dilated duct insinuated between the gallbladder and the liver. Further evaluation/characterization of the gallbladder and associated hepatic parenchyma can be performed with gallbladder ultrasound as clinically indicated. 3. Small right greater than left bilateral pleural effusions with underlying atelectasis. 4. Two adjacent low attenuation splenic lesions as above. While these are likely benign (cyst, hemangioma) they demonstrate nonspecific imaging features and a followup MRI is recommended in 6 months to ensure stability.   Electronically Signed   By: Annia Belt M.D.   On: 09/12/2013 08:32       Assessment/Plan Choledocholithiasis without evidence of cholecystitis: recommend ERCP. Cardiology and GI to evaluate and clear for procedure. Medicine to see and admit her. Obtain abdominal US for further evaluation of cholelithiasis. May recommend future cholecystectomy.   Maris Berger 09/12/2013, 10:49 AM Pager: [phone]

## 2013-09-13 ENCOUNTER — Encounter (HOSPITAL_COMMUNITY)
Admission: EM | Disposition: A | Discharge status: Home Health | Payer: Self-pay | Source: Home / Self Care | Attending: Internal Medicine

## 2013-09-13 ENCOUNTER — Inpatient Hospital Stay (HOSPITAL_COMMUNITY): Payer: Medicare Other

## 2013-09-13 DIAGNOSIS — K829 Disease of gallbladder, unspecified: Secondary | ICD-10-CM | POA: Diagnosis not present

## 2013-09-13 DIAGNOSIS — K8040 Calculus of bile duct with cholecystitis, unspecified, without obstruction: Secondary | ICD-10-CM | POA: Diagnosis not present

## 2013-09-13 DIAGNOSIS — I10 Essential (primary) hypertension: Secondary | ICD-10-CM

## 2013-09-13 DIAGNOSIS — K8020 Calculus of gallbladder without cholecystitis without obstruction: Secondary | ICD-10-CM | POA: Diagnosis not present

## 2013-09-13 DIAGNOSIS — K8050 Calculus of bile duct without cholangitis or cholecystitis without obstruction: Secondary | ICD-10-CM | POA: Diagnosis not present

## 2013-09-13 DIAGNOSIS — E1142 Type 2 diabetes mellitus with diabetic polyneuropathy: Secondary | ICD-10-CM | POA: Diagnosis not present

## 2013-09-13 DIAGNOSIS — R932 Abnormal findings on diagnostic imaging of liver and biliary tract: Secondary | ICD-10-CM | POA: Diagnosis not present

## 2013-09-13 DIAGNOSIS — I2510 Atherosclerotic heart disease of native coronary artery without angina pectoris: Secondary | ICD-10-CM | POA: Diagnosis not present

## 2013-09-13 DIAGNOSIS — R109 Unspecified abdominal pain: Secondary | ICD-10-CM | POA: Diagnosis not present

## 2013-09-13 DIAGNOSIS — K8042 Calculus of bile duct with acute cholecystitis without obstruction: Secondary | ICD-10-CM | POA: Diagnosis not present

## 2013-09-13 DIAGNOSIS — K8010 Calculus of gallbladder with chronic cholecystitis without obstruction: Secondary | ICD-10-CM | POA: Diagnosis not present

## 2013-09-13 DIAGNOSIS — K8309 Other cholangitis: Secondary | ICD-10-CM | POA: Diagnosis not present

## 2013-09-13 HISTORY — PX: ERCP: SHX5425

## 2013-09-13 LAB — GLUCOSE, CAPILLARY
Glucose-Capillary: 104 mg/dL — ABNORMAL HIGH (ref 70–99)
Glucose-Capillary: 94 mg/dL (ref 70–99)
Glucose-Capillary: 135 mg/dL — ABNORMAL HIGH (ref 70–99)
Glucose-Capillary: 138 mg/dL — ABNORMAL HIGH (ref 70–99)

## 2013-09-13 LAB — BASIC METABOLIC PANEL
CO2: 22 mEq/L (ref 19–32)
Calcium: 9 mg/dL (ref 8.4–10.5)
Creatinine, Ser: 0.81 mg/dL (ref 0.50–1.10)
GFR calc Af Amer: 75 mL/min — ABNORMAL LOW (ref >90)
GFR calc non Af Amer: 65 mL/min — ABNORMAL LOW (ref >90)
Sodium: 138 mEq/L (ref 135–145)

## 2013-09-13 LAB — URINE CULTURE

## 2013-09-13 LAB — CBC
HCT: 31.9 % — ABNORMAL LOW (ref 36.0–46.0)
MCHC: 35.1 g/dL (ref 30.0–36.0)
Platelets: 247 10*3/uL (ref 150–400)
RBC: 3.7 MIL/uL — ABNORMAL LOW (ref 3.87–5.11)
RDW: 14.8 % (ref 11.5–15.5)
WBC: 7.5 10*3/uL (ref 4.0–10.5)

## 2013-09-13 SURGERY — ERCP, WITH INTERVENTION IF INDICATED
Anesthesia: Moderate Sedation

## 2013-09-13 MED ORDER — GLYCOPYRROLATE 0.2 MG/ML IJ SOLN: INTRAMUSCULAR | Status: DC | PRN

## 2013-09-13 MED ORDER — DIPHENHYDRAMINE HCL 50 MG/ML IJ SOLN
INTRAMUSCULAR | Status: AC
  Filled 2013-09-13: qty 1

## 2013-09-13 MED ORDER — HYDRALAZINE HCL 20 MG/ML IJ SOLN
10.0000 mg | Freq: Three times a day (TID) | INTRAMUSCULAR | Status: DC | PRN
  Administered 2013-09-13 – 2013-09-14 (×3): 10 mg via INTRAVENOUS
  Filled 2013-09-13 (×3): qty 1

## 2013-09-13 MED ORDER — CIPROFLOXACIN IN D5W 400 MG/200ML IV SOLN
400.0000 mg | Freq: Two times a day (BID) | INTRAVENOUS | Status: DC
  Administered 2013-09-13 – 2013-09-16 (×5): 400 mg via INTRAVENOUS
  Filled 2013-09-13 (×8): qty 200

## 2013-09-13 MED ORDER — MIDAZOLAM HCL 5 MG/ML IJ SOLN
INTRAMUSCULAR | Status: AC
  Filled 2013-09-13: qty 3

## 2013-09-13 MED ORDER — SODIUM CHLORIDE 0.9 % IV SOLN
INTRAVENOUS | Status: DC | PRN
  Administered 2013-09-13: 15:00:00

## 2013-09-13 MED ORDER — SODIUM CHLORIDE 0.9 % IV SOLN
1.5000 g | Freq: Once | INTRAVENOUS | Status: DC
  Filled 2013-09-13: qty 1.5

## 2013-09-13 MED ORDER — MIDAZOLAM HCL 10 MG/2ML IJ SOLN
INTRAMUSCULAR | Status: DC | PRN
  Administered 2013-09-13: 1.5 mg via INTRAVENOUS

## 2013-09-13 MED ORDER — FENTANYL CITRATE 0.05 MG/ML IJ SOLN
INTRAMUSCULAR | Status: DC | PRN
  Administered 2013-09-13 (×2): 25 ug via INTRAVENOUS

## 2013-09-13 MED ORDER — GLUCAGON HCL (RDNA) 1 MG IJ SOLR
INTRAMUSCULAR | Status: DC | PRN
  Administered 2013-09-13: 1 mg via INTRAVENOUS

## 2013-09-13 MED ORDER — SODIUM CHLORIDE 0.9 % IV SOLN: INTRAVENOUS | Status: DC

## 2013-09-13 MED ORDER — GLUCAGON HCL (RDNA) 1 MG IJ SOLR
INTRAMUSCULAR | Status: AC
  Filled 2013-09-13: qty 2

## 2013-09-13 MED ORDER — FENTANYL CITRATE 0.05 MG/ML IJ SOLN
INTRAMUSCULAR | Status: AC
  Filled 2013-09-13: qty 4

## 2013-09-13 MED ORDER — BUTAMBEN-TETRACAINE-BENZOCAINE 2-2-14 % EX AERO
INHALATION_SPRAY | CUTANEOUS | Status: DC | PRN
  Administered 2013-09-13: 1 via TOPICAL

## 2013-09-13 NOTE — H&P (View-Only) (Signed)
Reason for Consult: Choledocholithiasis Referring Physician: [facility]  Kesa S Justus HPI: This is an 79 year old female admitted for choledocholithiasis and cholelithiasis.  She started to have symptoms acutely last night at 11 PM.  The pain continued to worsen and it was associated with nausea.  She presented to the ER and she was identified to have an elevated liver panel as well as an abnormal CT scan.  The CT scan revealed multiple stones in a dilated CBD as well as sludge in the gallbladder.  Additionally, in the distal CBD there may be a mass, but it is unclear.  Currently she feels well after having two large bowel movements.  Her pain, she describes, is located in the mid to lower abdomen with radiation to her back.  Past Medical History  Diagnosis Date  . Hypertension   . Diabetes mellitus     NON INSULIN DEPENDENT  . Hypercholesterolemia   . DJD (degenerative joint disease)   . History of diabetic neuropathy   . Chest pain   . DOE (dyspnea on exertion)   . IHD (ischemic heart disease)   . SSS (sick sinus syndrome)     s/p PPM by JA for SSS and syncope 9/11  . Breast cancer   . GERD (gastroesophageal reflux disease)   . Hemorrhoids   . CAD (coronary artery disease)     Past Surgical History  Procedure Laterality Date  . Cardiac catheterization  07/05/2009    EF 55-60%  . Insert / replace / remove pacemaker  9/11    SSS and syncope, implanted by JA (MDT)  . Mastectomy  1992    BILATERAL WITH RECONSTRUCTION  . Colonoscopy      Family History  Problem Relation Age of Onset  . Cancer Mother   . Cancer Father     Social History:  reports that she has never smoked. She does not have any smokeless tobacco history on file. She reports that she does not drink alcohol or use illicit drugs.  Allergies:  Allergies  Allergen Reactions  . Lyrica [Pregabalin] Swelling    wgt gain  . Sulfonamide Derivatives Rash    Medications:  Scheduled: . sodium chloride    Intravenous STAT  . aspirin EC  81 mg Oral Daily  . carvedilol  18.75 mg Oral BID WC  . insulin aspart  0-5 Units Subcutaneous QHS  . insulin aspart  0-9 Units Subcutaneous TID WC  . lisinopril  20 mg Oral BID  . sodium chloride  3 mL Intravenous Q12H   Continuous: . sodium chloride 75 mL/hr at 09/12/13 1237    Results for orders placed during the hospital encounter of 09/12/13 (from the past 24 hour(s))  CBC WITH DIFFERENTIAL     Status: Abnormal   Collection Time    09/12/13  5:15 AM      Result Value Range   WBC 11.9 (*) 4.0 - 10.5 K/uL   RBC 4.07  3.87 - 5.11 MIL/uL   Hemoglobin 12.2  12.0 - 15.0 g/dL   HCT 35.2 (*) 36.0 - 46.0 %   MCV 86.5  78.0 - 100.0 fL   MCH 30.0  26.0 - 34.0 pg   MCHC 34.7  30.0 - 36.0 g/dL   RDW 14.4  11.5 - 15.5 %   Platelets 258  150 - 400 K/uL   Neutrophils Relative % 88 (*) 43 - 77 %   Neutro Abs 10.6 (*) 1.7 - 7.7 K/uL   Lymphocytes   Relative 8 (*) 12 - 46 %   Lymphs Abs 1.0  0.7 - 4.0 K/uL   Monocytes Relative 3  3 - 12 %   Monocytes Absolute 0.4  0.1 - 1.0 K/uL   Eosinophils Relative 0  0 - 5 %   Eosinophils Absolute 0.0  0.0 - 0.7 K/uL   Basophils Relative 0  0 - 1 %   Basophils Absolute 0.0  0.0 - 0.1 K/uL  COMPREHENSIVE METABOLIC PANEL     Status: Abnormal   Collection Time    09/12/13  5:15 AM      Result Value Range   Sodium 136  135 - 145 mEq/L   Potassium 3.5  3.5 - 5.1 mEq/L   Chloride 100  96 - 112 mEq/L   CO2 23  19 - 32 mEq/L   Glucose, Bld 182 (*) 70 - 99 mg/dL   BUN 15  6 - 23 mg/dL   Creatinine, Ser 0.66  0.50 - 1.10 mg/dL   Calcium 9.6  8.4 - 10.5 mg/dL   Total Protein 7.3  6.0 - 8.3 g/dL   Albumin 3.6  3.5 - 5.2 g/dL   AST 45 (*) 0 - 37 U/L   ALT 70 (*) 0 - 35 U/L   Alkaline Phosphatase 295 (*) 39 - 117 U/L   Total Bilirubin 3.1 (*) 0.3 - 1.2 mg/dL   GFR calc non Af Amer 79 (*) >90 mL/min   GFR calc Af Amer >90  >90 mL/min  LIPASE, BLOOD     Status: Abnormal   Collection Time    09/12/13  5:15 AM      Result  Value Range   Lipase 81 (*) 11 - 59 U/L  POCT I-STAT, CHEM 8     Status: Abnormal   Collection Time    09/12/13  5:20 AM      Result Value Range   Sodium 139  135 - 145 mEq/L   Potassium 3.7  3.5 - 5.1 mEq/L   Chloride 102  96 - 112 mEq/L   BUN 15  6 - 23 mg/dL   Creatinine, Ser 0.90  0.50 - 1.10 mg/dL   Glucose, Bld 191 (*) 70 - 99 mg/dL   Calcium, Ion 1.18  1.13 - 1.30 mmol/L   TCO2 21  0 - 100 mmol/L   Hemoglobin 11.6 (*) 12.0 - 15.0 g/dL   HCT 34.0 (*) 36.0 - 46.0 %  CG4 I-STAT (LACTIC ACID)     Status: None   Collection Time    09/12/13  5:21 AM      Result Value Range   Lactic Acid, Venous 1.02  0.5 - 2.2 mmol/L  URINALYSIS, ROUTINE W REFLEX MICROSCOPIC     Status: Abnormal   Collection Time    09/12/13  6:39 AM      Result Value Range   Color, Urine YELLOW  YELLOW   APPearance CLOUDY (*) CLEAR   Specific Gravity, Urine 1.014  1.005 - 1.030   pH 7.5  5.0 - 8.0   Glucose, UA 100 (*) NEGATIVE mg/dL   Hgb urine dipstick NEGATIVE  NEGATIVE   Bilirubin Urine NEGATIVE  NEGATIVE   Ketones, ur 15 (*) NEGATIVE mg/dL   Protein, ur 30 (*) NEGATIVE mg/dL   Urobilinogen, UA 1.0  0.0 - 1.0 mg/dL   Nitrite NEGATIVE  NEGATIVE   Leukocytes, UA TRACE (*) NEGATIVE  URINE MICROSCOPIC-ADD ON     Status: Abnormal   Collection Time      09/12/13  6:39 AM      Result Value Range   Squamous Epithelial / LPF RARE  RARE   WBC, UA 3-6  <3 WBC/hpf   RBC / HPF 0-2  <3 RBC/hpf   Bacteria, UA FEW (*) RARE   Urine-Other AMORPHOUS URATES/PHOSPHATES    GLUCOSE, CAPILLARY     Status: Abnormal   Collection Time    09/12/13  4:49 PM      Result Value Range   Glucose-Capillary 128 (*) 70 - 99 mg/dL     Us Abdomen Complete  09/12/2013   CLINICAL DATA:  Choledocholithiasis, elevated bilirubin and LFTs  EXAM: ULTRASOUND ABDOMEN COMPLETE  COMPARISON:  Concurrently obtained CT scan of the abdomen and pelvis performed earlier today  FINDINGS: Gallbladder  The gallbladder is distended. Echogenic  shadowing debris consistent with sludge and small stones layers dependently. There is diffuse gallbladder wall thickening up to 7.3 mm. Trace pericholecystic fluid. Per the sonographer, sonographic Murphy sign was negative.  Common bile duct  Diameter: Dilated up to 1.5 cm. The distal most common bile duct is obscured by overlying bowel gas. Small echogenic foci are noted within the dilated common duct suggesting the presence of sludge and/ or small stones.  Liver  No focal lesion identified. Within normal limits in parenchymal echogenicity. Mild intrahepatic biliary ductal dilatation.  IVC  No abnormality visualized.  Pancreas  Visualized portion unremarkable. Calcification in the region the pancreatic tail is likely vascular in related to the splenic artery.  Spleen  Within normal limits for size. There are 3 sonographically simple cystic lesions. The largest measures up to 1.5 cm.  Right Kidney  Length: 10.6. Echogenicity within normal limits. No mass or hydronephrosis visualized. Mild nonspecific perinephric fluid.  Left Kidney  Length: 11.3 cm. Echogenicity within normal limits. No mass or hydronephrosis visualized.  Abdominal aorta  No aneurysm visualized.  IMPRESSION: 1. Cholelithiasis with dilated intra and extrahepatic bile ducts. The gallbladder is distended in the gallbladder wall thickened with a small amount of pericholecystic fluid. The sonographic Murphy sign was negative, however this finding is less sensitive in the elderly population. Findings are concerning for acute versus chronic cholecystitis. 2. Dilated common bile duct containing internal debris consistent with the findings of choledocholithiasis on the recently obtained CT scan of the abdomen and pelvis. Recommend a GI consultation if not already obtained for possible ERCP. 3. Sonographically simple splenic cysts.   Electronically Signed   By: Heath  McCullough M.D.   On: 09/12/2013 15:53   Ct Abdomen Pelvis W Contrast  09/12/2013    CLINICAL DATA:  Diffuse lower abdominal pain.  EXAM: CT ABDOMEN AND PELVIS WITH CONTRAST  TECHNIQUE: Multidetector CT imaging of the abdomen and pelvis was performed using the standard protocol following bolus administration of intravenous contrast.  CONTRAST:  80mL OMNIPAQUE IOHEXOL 300 MG/ML  SOLN  COMPARISON:  None  FINDINGS: Visualization of the lower thorax demonstrates small, right greater than left, pleural effusions. Bilateral lower lung ground-glass opacities likely represent atelectasis. Heart is enlarged.  The liver is heterogeneous in attenuation. There is relative increased enhancement of the liver adjacent to the gallbladder. Additionally there is suggestion of a small amount of fluid insinuated between the gallbladder and the right hepatic lobe. Layering high attenuation material within the gallbladder lumen. Marked intrahepatic and extrahepatic biliary ductal dilatation with the common bile duct measuring up to 1.5 cm. Within the distal aspect of the common bile duct there are high attenuation filling defects measuring approximately 3.5 cm   proximal to the ampulla.  Spleen is normal in size. There are 2 low-attenuation lesions within the spleen measuring 1.5 and 1.4 cm respectively. Mild atrophy of the pancreatic parenchyma. Mild thickening of the right adrenal gland. The left adrenal gland is grossly unremarkable. Kidneys enhance symmetrically with contrast. No hydronephrosis.  Normal caliber abdominal aorta with scattered calcified atherosclerotic plaque. Urinary bladder is unremarkable. Extensive descending and sigmoid colonic diverticulosis without CT evidence for acute diverticulitis. No abnormal bowel wall thickening. No evidence for bowel obstruction. No free fluid or free intraperitoneal air. Surgical clips within the anterior lower abdominal wall.  Lower lumbar spine degenerative change. No aggressive appearing osseous lesions.  IMPRESSION: 1. Marked intrahepatic and extrahepatic biliary  ductal dilatation with the common bile duct measuring up to 1.5 cm. There are multiple high density filling defects demonstrated within the distal aspect of the common bile duct, raising the possibility of choledocholithiasis. Intraductal mass lesion is an alternative possibility. Further evaluation with ERCP is recommended. 2. Cholelithiasis. Relative increased enhancement of the liver parenchyma adjacent to the gallbladder fossa with a small amount of fluid versus dilated duct insinuated between the gallbladder and the liver. Further evaluation/characterization of the gallbladder and associated hepatic parenchyma can be performed with gallbladder ultrasound as clinically indicated. 3. Small right greater than left bilateral pleural effusions with underlying atelectasis. 4. Two adjacent low attenuation splenic lesions as above. While these are likely benign (cyst, hemangioma) they demonstrate nonspecific imaging features and a followup MRI is recommended in 6 months to ensure stability.   Electronically Signed   By: Drew  Davis M.D.   On: 09/12/2013 08:32    ROS:  As stated above in the HPI otherwise negative.  Blood pressure 163/75, pulse 70, temperature 98.1 F (36.7 C), temperature source Oral, resp. rate 22, height 5' 4" (1.626 m), weight 154 lb (69.854 kg), SpO2 96.00%.    PE: Gen: NAD, Alert and Oriented HEENT:  /AT, EOMI Neck: Supple, no LAD Lungs: CTA Bilaterally CV: RRR without M/G/R ABM: Soft, NTND, +BS Ext: No C/C/E  Assessment/Plan: 1) Choledocholithiasis. 2) Cholelithiasis. 3) Abnormal liver enzymes. 4) ? Mass in the distal CBD.   I will pursue further evaluation and treatment with an ERCP.  If there is mass in the distal CBD, I brushings will be obtained.  Plan: 1) ERCP tomorrow.  Mcdaniel Ohms D 09/12/2013, 5:38 PM      

## 2013-09-13 NOTE — Progress Notes (Signed)
Subjective: Denies abd pain, n/v.    Objective: Vital signs in last 24 hours: Temp:  [97.7 F (36.5 C)-98.8 F (37.1 C)] 97.7 F (36.5 C) (11/11 0603) Pulse Rate:  [62-76] 62 (11/11 0603) Resp:  [20-33] 20 (11/11 0603) BP: (149-190)/(59-80) 178/78 mmHg (11/11 0603) SpO2:  [85 %-98 %] 94 % (11/11 0603) Weight:  [154 lb (69.854 kg)] 154 lb (69.854 kg) (11/10 1227) Last BM Date: 09/12/13  Intake/Output from previous day: 11/10 0701 - 11/11 0700 In: 103.8 [I.V.:103.8] Out: -  Intake/Output this shift:   Physical Exam: General appearance: alert, cooperative, appears stated age and no distress Resp: clear to auscultation bilaterally Cardio: regular rate and rhythm, S1, S2 normal, no murmur, click, rub or gallop GI: soft, non-tender; bowel sounds normal; no masses,  no organomegaly.  Negative Murphy's sign Extremities: extremities normal, atraumatic, no cyanosis or edema  Lab Results:   Recent Labs  09/12/13 0515 09/12/13 0520  WBC 11.9*  --   HGB 12.2 11.6*  HCT 35.2* 34.0*  PLT 258  --    BMET  Recent Labs  09/12/13 0515 09/12/13 0520  NA 136 139  K 3.5 3.7  CL 100 102  CO2 23  --   GLUCOSE 182* 191*  BUN 15 15  CREATININE 0.66 0.90  CALCIUM 9.6  --    PT/INR No results found for this basename: LABPROT, INR,  in the last 72 hours ABG No results found for this basename: PHART, PCO2, PO2, HCO3,  in the last 72 hours  Studies/Results: US Abdomen Complete  09/12/2013   CLINICAL DATA:  Choledocholithiasis, elevated bilirubin and LFTs  EXAM: ULTRASOUND ABDOMEN COMPLETE  COMPARISON:  Concurrently obtained CT scan of the abdomen and pelvis performed earlier today  FINDINGS: Gallbladder  The gallbladder is distended. Echogenic shadowing debris consistent with sludge and small stones layers dependently. There is diffuse gallbladder wall thickening up to 7.3 mm. Trace pericholecystic fluid. Per the sonographer, sonographic Eulah Pont sign was negative.  Common bile duct   Diameter: Dilated up to 1.5 cm. The distal most common bile duct is obscured by overlying bowel gas. Small echogenic foci are noted within the dilated common duct suggesting the presence of sludge and/ or small stones.  Liver  No focal lesion identified. Within normal limits in parenchymal echogenicity. Mild intrahepatic biliary ductal dilatation.  IVC  No abnormality visualized.  Pancreas  Visualized portion unremarkable. Calcification in the region the pancreatic tail is likely vascular in related to the splenic artery.  Spleen  Within normal limits for size. There are 3 sonographically simple cystic lesions. The largest measures up to 1.5 cm.  Right Kidney  Length: 10.6. Echogenicity within normal limits. No mass or hydronephrosis visualized. Mild nonspecific perinephric fluid.  Left Kidney  Length: 11.3 cm. Echogenicity within normal limits. No mass or hydronephrosis visualized.  Abdominal aorta  No aneurysm visualized.  IMPRESSION: 1. Cholelithiasis with dilated intra and extrahepatic bile ducts. The gallbladder is distended in the gallbladder wall thickened with a small amount of pericholecystic fluid. The sonographic Eulah Pont sign was negative, however this finding is less sensitive in the elderly population. Findings are concerning for acute versus chronic cholecystitis. 2. Dilated common bile duct containing internal debris consistent with the findings of choledocholithiasis on the recently obtained CT scan of the abdomen and pelvis. Recommend a GI consultation if not already obtained for possible ERCP. 3. Sonographically simple splenic cysts.   Electronically Signed   By: Malachy Moan M.D.   On: 09/12/2013 15:53  Ct Abdomen Pelvis W Contrast  09/12/2013   CLINICAL DATA:  Diffuse lower abdominal pain.  EXAM: CT ABDOMEN AND PELVIS WITH CONTRAST  TECHNIQUE: Multidetector CT imaging of the abdomen and pelvis was performed using the standard protocol following bolus administration of intravenous  contrast.  CONTRAST:  80mL OMNIPAQUE IOHEXOL 300 MG/ML  SOLN  COMPARISON:  None  FINDINGS: Visualization of the lower thorax demonstrates small, right greater than left, pleural effusions. Bilateral lower lung ground-glass opacities likely represent atelectasis. Heart is enlarged.  The liver is heterogeneous in attenuation. There is relative increased enhancement of the liver adjacent to the gallbladder. Additionally there is suggestion of a small amount of fluid insinuated between the gallbladder and the right hepatic lobe. Layering high attenuation material within the gallbladder lumen. Marked intrahepatic and extrahepatic biliary ductal dilatation with the common bile duct measuring up to 1.5 cm. Within the distal aspect of the common bile duct there are high attenuation filling defects measuring approximately 3.5 cm proximal to the ampulla.  Spleen is normal in size. There are 2 low-attenuation lesions within the spleen measuring 1.5 and 1.4 cm respectively. Mild atrophy of the pancreatic parenchyma. Mild thickening of the right adrenal gland. The left adrenal gland is grossly unremarkable. Kidneys enhance symmetrically with contrast. No hydronephrosis.  Normal caliber abdominal aorta with scattered calcified atherosclerotic plaque. Urinary bladder is unremarkable. Extensive descending and sigmoid colonic diverticulosis without CT evidence for acute diverticulitis. No abnormal bowel wall thickening. No evidence for bowel obstruction. No free fluid or free intraperitoneal air. Surgical clips within the anterior lower abdominal wall.  Lower lumbar spine degenerative change. No aggressive appearing osseous lesions.  IMPRESSION: 1. Marked intrahepatic and extrahepatic biliary ductal dilatation with the common bile duct measuring up to 1.5 cm. There are multiple high density filling defects demonstrated within the distal aspect of the common bile duct, raising the possibility of choledocholithiasis. Intraductal mass  lesion is an alternative possibility. Further evaluation with ERCP is recommended. 2. Cholelithiasis. Relative increased enhancement of the liver parenchyma adjacent to the gallbladder fossa with a small amount of fluid versus dilated duct insinuated between the gallbladder and the liver. Further evaluation/characterization of the gallbladder and associated hepatic parenchyma can be performed with gallbladder ultrasound as clinically indicated. 3. Small right greater than left bilateral pleural effusions with underlying atelectasis. 4. Two adjacent low attenuation splenic lesions as above. While these are likely benign (cyst, hemangioma) they demonstrate nonspecific imaging features and a followup MRI is recommended in 6 months to ensure stability.   Electronically Signed   By: Annia Belt M.D.   On: 09/12/2013 08:32    Anti-infectives: Anti-infectives   None      Assessment/Plan: 79 year old female with a history of CAD, DM, HTN, SSS with permanent pacemaker   Choledocholithiasis  Cholelithiasis Abnormal liver enzymes   -Undergoing ERCP today -cleared by cardiology -NPO after midnight tonight -repeat labs in AM -will likely need a cholecystectomy   LOS: 1 day    Hydeia Mcatee ANP-BC 09/13/2013 7:36 AM

## 2013-09-13 NOTE — Interval H&P Note (Signed)
History and Physical Interval Note:  09/13/2013 1:48 PM  Jessica [name]  has presented today for surgery, with the diagnosis of Choledocholithiasis  The various methods of treatment have been discussed with the patient and family. After consideration of risks, benefits and other options for treatment, the patient has consented to  Procedure(s): ENDOSCOPIC RETROGRADE CHOLANGIOPANCREATOGRAPHY (ERCP) (N/A) as a surgical intervention .  The patient's history has been reviewed, patient examined, no change in status, stable for surgery.  I have reviewed the patient's chart and labs.  Questions were answered to the patient's satisfaction.     Jazlene Bares D

## 2013-09-13 NOTE — Op Note (Signed)
[facility] [address] [place], [address]   ERCP PROCEDURE REPORT  PATIENT: Jessica, [name].  MR# :[id number] BIRTHDATE: 08-29-1927  GENDER: Female ENDOSCOPIST: Jeani Hawking, MD REFERRED BY: PROCEDURE DATE:  09/13/2013 PROCEDURE:   ERCP with removal of calculus/calculi ASA CLASS:   Class III INDICATIONS:Choledocholithiasis. MEDICATIONS: Versed 4 mg IV, Fentanyl 67.5 mcg IV, and Glucagon 1 mg IV, Unasyn 1.5 g IV TOPICAL ANESTHETIC: Cetacaine Spray  DESCRIPTION OF PROCEDURE:   After the risks benefits and alternatives of the procedure were thoroughly explained, informed consent was obtained.  The Pentax Ercp Scope [id number]  endoscope was introduced through the mouth  and advanced to the second portion of the duodenum .The ampulla was identified and there was evidence of sludge from the ampulla.  Cannulation was performed with ease and the guidewire was secured in the right intrahepatic ducts. Contrast injection revealed a dilated CBD measuring 12 mm with multiple distal filling defects.  A 1 cm sphincterotomy was created.  Several attempts were made to extend the sphincterotomy to the base of the ampulla, but the contractions of the duodenum preclued visualization.  The contractions did not stop with a 1 mg dose of glucagon.  With the extraction balloon the distal stones were removed.  A copious amount of sludge was also extracted with the stones.  A second pass removed the remaining stones.  In total there were 4-5 stones.  The CBD was swept multiple times from the proximal CBD and a final occlusion cholangiogram was negative for any retained stones.  No evidence of any masses in the distal CBD to correlate with the CT scan findings.  During the final CBD sweeps pus was noted extruding from the CBD consistent with a cholangitis.  The scope was then completely withdrawn from the patient and the procedure terminated.     COMPLICATIONS: .  There  were no complications.  ENDOSCOPIC IMPRESSION: 1)  Choledocholithiasis s/p successful stone extraction. 2)  Cholangitis.  RECOMMENDATIONS: 1) Clear liquid diet. 2) Cipro 400 mg IV Q12 hours.    _______________________________ eSignedJeani Hawking, MD 09/13/2013 2:58 PM   CC:

## 2013-09-13 NOTE — Progress Notes (Signed)
Will need lap chole at some point in next 24 - 48 hours.

## 2013-09-13 NOTE — Progress Notes (Signed)
[facility] PROGRESS NOTE  SHAM ALVIAR ZOX:[id number] DOB: 06/06/1927 DOA: 09/12/2013 PCP: [name], MD  Assessment/Plan: 1-cholelithiasis/choledocholithiasis rule out any intraductal mass lesion, lipase is slightly high with transaminitis: Significantly improved  - ERCP: 1) Choledocholithiasis s/p successful stone extraction.  2) Cholangitis. -Surgery planning cholecystectomy during this admission.  - Will continue n.p.o. Status, IV fluids, pain control, and antiemetics  -Started on Ciprofloxacin day 1.   2-DM  - Placed on sliding scale insulin   3-Hypertriglyceridemia:  - Hold Crestor and omega-3 fatty acids due to transaminitis   4-CAD:  - Continue aspirin Coreg, lisinopril, hold statin  -Clear by cardio for procedure.   5-Pacemaker-Medtronic   6-Hypertension:  -  Coreg and lisinopril  -Will start IV PRN hydralazine.   DVT prophylaxis: SCDs    Code Status: Full Code.  Family Communication: care discussed with patient, daughter at bedside.  Disposition Plan: to be determine   Consultants:  Surgery  GI  Procedures: ERCP: ENDOSCOPIC IMPRESSION:  1) Choledocholithiasis s/p successful stone extraction.  2) Cholangitis.    Antibiotics:  Ciprofloxacin 11-11  HPI/Subjective: Denies abdominal pain.  No nausea or vomiting. Feeling better.   Objective: Filed Vitals:   09/13/13 1515  BP: 177/64  Pulse: 64  Temp:   Resp: 22    Intake/Output Summary (Last 24 hours) at 09/13/13 1611 Last data filed at 09/13/13 8119  Gross per 24 hour  Intake   1350 ml  Output      0 ml  Net   1350 ml   Filed Weights   09/12/13 0445 09/12/13 1227  Weight: 70.308 kg (155 lb) 69.854 kg (154 lb)    Exam:   General:  NAD  Cardiovascular: S 1, S 2 RRR  Respiratory: CTA  Abdomen: BS present, soft, nt  Musculoskeletal: no edema.   Data Reviewed: Basic Metabolic Panel:  Recent Labs Lab 09/12/13 0515 09/12/13 0520 09/13/13 0635  NA 136 139  138  K 3.5 3.7 3.6  CL 100 102 105  CO2 23  --  22  GLUCOSE 182* 191* 104*  BUN 15 15 14   CREATININE 0.66 0.90 0.81  CALCIUM 9.6  --  9.0   Liver Function Tests:  Recent Labs Lab 09/12/13 0515  AST 45*  ALT 70*  ALKPHOS 295*  BILITOT 3.1*  PROT 7.3  ALBUMIN 3.6    Recent Labs Lab 09/12/13 0515  LIPASE 81*   No results found for this basename: AMMONIA,  in the last 168 hours CBC:  Recent Labs Lab 09/12/13 0515 09/12/13 0520 09/13/13 0635  WBC 11.9*  --  7.5  NEUTROABS 10.6*  --   --   HGB 12.2 11.6* 11.2*  HCT 35.2* 34.0* 31.9*  MCV 86.5  --  86.2  PLT 258  --  247   Cardiac Enzymes: No results found for this basename: CKTOTAL, CKMB, CKMBINDEX, TROPONINI,  in the last 168 hours BNP (last 3 results) No results found for this basename: PROBNP,  in the last 8760 hours CBG:  Recent Labs Lab 09/12/13 1649 09/12/13 2111 09/13/13 0800 09/13/13 1146  GLUCAP 128* 130* 104* 94    Recent Results (from the past 240 hour(s))  URINE CULTURE     Status: None   Collection Time    09/12/13  6:39 AM      Result Value Range Status   Specimen Description URINE, CLEAN CATCH   Final   Special Requests NONE   Final   Culture  Setup Time  Final   Value: 09/12/2013 13:58     Performed at [facility]   Culture     Final   Value: >=100,000 COLONIES/mL ESCHERICHIA COLI     Performed at [facility]   Report Status PENDING   Incomplete     Studies: US Abdomen Complete  09/12/2013   CLINICAL DATA:  Choledocholithiasis, elevated bilirubin and LFTs  EXAM: ULTRASOUND ABDOMEN COMPLETE  COMPARISON:  Concurrently obtained CT scan of the abdomen and pelvis performed earlier today  FINDINGS: Gallbladder  The gallbladder is distended. Echogenic shadowing debris consistent with sludge and small stones layers dependently. There is diffuse gallbladder wall thickening up to 7.3 mm. Trace pericholecystic fluid. Per the sonographer, sonographic Eulah Pont sign was  negative.  Common bile duct  Diameter: Dilated up to 1.5 cm. The distal most common bile duct is obscured by overlying bowel gas. Small echogenic foci are noted within the dilated common duct suggesting the presence of sludge and/ or small stones.  Liver  No focal lesion identified. Within normal limits in parenchymal echogenicity. Mild intrahepatic biliary ductal dilatation.  IVC  No abnormality visualized.  Pancreas  Visualized portion unremarkable. Calcification in the region the pancreatic tail is likely vascular in related to the splenic artery.  Spleen  Within normal limits for size. There are 3 sonographically simple cystic lesions. The largest measures up to 1.5 cm.  Right Kidney  Length: 10.6. Echogenicity within normal limits. No mass or hydronephrosis visualized. Mild nonspecific perinephric fluid.  Left Kidney  Length: 11.3 cm. Echogenicity within normal limits. No mass or hydronephrosis visualized.  Abdominal aorta  No aneurysm visualized.  IMPRESSION: 1. Cholelithiasis with dilated intra and extrahepatic bile ducts. The gallbladder is distended in the gallbladder wall thickened with a small amount of pericholecystic fluid. The sonographic Eulah Pont sign was negative, however this finding is less sensitive in the elderly population. Findings are concerning for acute versus chronic cholecystitis. 2. Dilated common bile duct containing internal debris consistent with the findings of choledocholithiasis on the recently obtained CT scan of the abdomen and pelvis. Recommend a GI consultation if not already obtained for possible ERCP. 3. Sonographically simple splenic cysts.   Electronically Signed   By: Malachy Moan M.D.   On: 09/12/2013 15:53   Ct Abdomen Pelvis W Contrast  09/12/2013   CLINICAL DATA:  Diffuse lower abdominal pain.  EXAM: CT ABDOMEN AND PELVIS WITH CONTRAST  TECHNIQUE: Multidetector CT imaging of the abdomen and pelvis was performed using the standard protocol following bolus  administration of intravenous contrast.  CONTRAST:  80mL OMNIPAQUE IOHEXOL 300 MG/ML  SOLN  COMPARISON:  None  FINDINGS: Visualization of the lower thorax demonstrates small, right greater than left, pleural effusions. Bilateral lower lung ground-glass opacities likely represent atelectasis. Heart is enlarged.  The liver is heterogeneous in attenuation. There is relative increased enhancement of the liver adjacent to the gallbladder. Additionally there is suggestion of a small amount of fluid insinuated between the gallbladder and the right hepatic lobe. Layering high attenuation material within the gallbladder lumen. Marked intrahepatic and extrahepatic biliary ductal dilatation with the common bile duct measuring up to 1.5 cm. Within the distal aspect of the common bile duct there are high attenuation filling defects measuring approximately 3.5 cm proximal to the ampulla.  Spleen is normal in size. There are 2 low-attenuation lesions within the spleen measuring 1.5 and 1.4 cm respectively. Mild atrophy of the pancreatic parenchyma. Mild thickening of the right adrenal gland. The left adrenal gland  is grossly unremarkable. Kidneys enhance symmetrically with contrast. No hydronephrosis.  Normal caliber abdominal aorta with scattered calcified atherosclerotic plaque. Urinary bladder is unremarkable. Extensive descending and sigmoid colonic diverticulosis without CT evidence for acute diverticulitis. No abnormal bowel wall thickening. No evidence for bowel obstruction. No free fluid or free intraperitoneal air. Surgical clips within the anterior lower abdominal wall.  Lower lumbar spine degenerative change. No aggressive appearing osseous lesions.  IMPRESSION: 1. Marked intrahepatic and extrahepatic biliary ductal dilatation with the common bile duct measuring up to 1.5 cm. There are multiple high density filling defects demonstrated within the distal aspect of the common bile duct, raising the possibility of  choledocholithiasis. Intraductal mass lesion is an alternative possibility. Further evaluation with ERCP is recommended. 2. Cholelithiasis. Relative increased enhancement of the liver parenchyma adjacent to the gallbladder fossa with a small amount of fluid versus dilated duct insinuated between the gallbladder and the liver. Further evaluation/characterization of the gallbladder and associated hepatic parenchyma can be performed with gallbladder ultrasound as clinically indicated. 3. Small right greater than left bilateral pleural effusions with underlying atelectasis. 4. Two adjacent low attenuation splenic lesions as above. While these are likely benign (cyst, hemangioma) they demonstrate nonspecific imaging features and a followup MRI is recommended in 6 months to ensure stability.   Electronically Signed   By: Annia Belt M.D.   On: 09/12/2013 08:32    Scheduled Meds: . aspirin EC  81 mg Oral Daily  . carvedilol  18.75 mg Oral BID WC  . ciprofloxacin  400 mg Intravenous Q12H  . insulin aspart  0-5 Units Subcutaneous QHS  . insulin aspart  0-9 Units Subcutaneous TID WC  . lisinopril  20 mg Oral BID  . sodium chloride  3 mL Intravenous Q12H   Continuous Infusions: . sodium chloride 75 mL/hr at 09/13/13 0155    Principal Problem:   Abdominal pain Active Problems:   DM   HYPERCHOLESTEROLEMIA   CAD   Pacemaker-Medtronic   Hypertension   Cholelithiasis    Time spent: 25 minutes.     Samanthia Howland  [facility] Pager [phone]. If 7PM-7AM, please contact night-coverage at [email or web address], password [facility] 09/13/2013, 4:11 PM  LOS: 1 day

## 2013-09-14 ENCOUNTER — Encounter (HOSPITAL_COMMUNITY)
Admission: EM | Disposition: A | Discharge status: Home Health | Payer: Self-pay | Source: Home / Self Care | Attending: Internal Medicine

## 2013-09-14 ENCOUNTER — Encounter (HOSPITAL_COMMUNITY): Payer: Self-pay | Admitting: Gastroenterology

## 2013-09-14 ENCOUNTER — Inpatient Hospital Stay (HOSPITAL_COMMUNITY): Payer: Medicare Other | Admitting: Anesthesiology

## 2013-09-14 ENCOUNTER — Encounter (HOSPITAL_COMMUNITY): Payer: Medicare Other | Admitting: Anesthesiology

## 2013-09-14 ENCOUNTER — Inpatient Hospital Stay (HOSPITAL_COMMUNITY): Payer: Medicare Other

## 2013-09-14 DIAGNOSIS — K8020 Calculus of gallbladder without cholecystitis without obstruction: Secondary | ICD-10-CM | POA: Diagnosis not present

## 2013-09-14 DIAGNOSIS — K8309 Other cholangitis: Secondary | ICD-10-CM | POA: Diagnosis not present

## 2013-09-14 DIAGNOSIS — I10 Essential (primary) hypertension: Secondary | ICD-10-CM | POA: Diagnosis not present

## 2013-09-14 DIAGNOSIS — E119 Type 2 diabetes mellitus without complications: Secondary | ICD-10-CM | POA: Diagnosis not present

## 2013-09-14 DIAGNOSIS — K8042 Calculus of bile duct with acute cholecystitis without obstruction: Secondary | ICD-10-CM | POA: Diagnosis not present

## 2013-09-14 DIAGNOSIS — K8010 Calculus of gallbladder with chronic cholecystitis without obstruction: Secondary | ICD-10-CM

## 2013-09-14 DIAGNOSIS — K8040 Calculus of bile duct with cholecystitis, unspecified, without obstruction: Secondary | ICD-10-CM | POA: Diagnosis not present

## 2013-09-14 DIAGNOSIS — E1142 Type 2 diabetes mellitus with diabetic polyneuropathy: Secondary | ICD-10-CM | POA: Diagnosis not present

## 2013-09-14 DIAGNOSIS — I119 Hypertensive heart disease without heart failure: Secondary | ICD-10-CM | POA: Diagnosis not present

## 2013-09-14 DIAGNOSIS — K829 Disease of gallbladder, unspecified: Secondary | ICD-10-CM | POA: Diagnosis not present

## 2013-09-14 DIAGNOSIS — I2510 Atherosclerotic heart disease of native coronary artery without angina pectoris: Secondary | ICD-10-CM | POA: Diagnosis not present

## 2013-09-14 HISTORY — PX: CHOLECYSTECTOMY: SHX55

## 2013-09-14 LAB — GLUCOSE, CAPILLARY
Glucose-Capillary: 87 mg/dL (ref 70–99)
Glucose-Capillary: 115 mg/dL — ABNORMAL HIGH (ref 70–99)
Glucose-Capillary: 143 mg/dL — ABNORMAL HIGH (ref 70–99)
Glucose-Capillary: 126 mg/dL — ABNORMAL HIGH (ref 70–99)

## 2013-09-14 LAB — CBC
Hemoglobin: 11 g/dL — ABNORMAL LOW (ref 12.0–15.0)
MCV: 86.5 fL (ref 78.0–100.0)
Platelets: 256 10*3/uL (ref 150–400)
RBC: 3.7 MIL/uL — ABNORMAL LOW (ref 3.87–5.11)
WBC: 9.6 10*3/uL (ref 4.0–10.5)

## 2013-09-14 LAB — BASIC METABOLIC PANEL
BUN: 13 mg/dL (ref 6–23)
CO2: 20 mEq/L (ref 19–32)
Calcium: 8.7 mg/dL (ref 8.4–10.5)
Chloride: 107 mEq/L (ref 96–112)
Creatinine, Ser: 0.73 mg/dL (ref 0.50–1.10)
Glucose, Bld: 97 mg/dL (ref 70–99)
Sodium: 139 mEq/L (ref 135–145)

## 2013-09-14 LAB — HEPATIC FUNCTION PANEL
Albumin: 2.9 g/dL — ABNORMAL LOW (ref 3.5–5.2)
Total Bilirubin: 3.4 mg/dL — ABNORMAL HIGH (ref 0.3–1.2)
Total Protein: 6 g/dL (ref 6.0–8.3)

## 2013-09-14 SURGERY — LAPAROSCOPIC CHOLECYSTECTOMY WITH INTRAOPERATIVE CHOLANGIOGRAM
Anesthesia: General | Wound class: Contaminated

## 2013-09-14 MED ORDER — SODIUM CHLORIDE 0.9 % IV SOLN
INTRAVENOUS | Status: DC | PRN
  Administered 2013-09-14: 15:00:00

## 2013-09-14 MED ORDER — HYDROMORPHONE HCL PF 1 MG/ML IJ SOLN
INTRAMUSCULAR | Status: AC
  Administered 2013-09-14: 0.5 mg via INTRAVENOUS
  Filled 2013-09-14: qty 1

## 2013-09-14 MED ORDER — 0.9 % SODIUM CHLORIDE (POUR BTL) OPTIME
TOPICAL | Status: DC | PRN
  Administered 2013-09-14: 1000 mL

## 2013-09-14 MED ORDER — SODIUM CHLORIDE 0.9 % IV SOLN
1.5000 g | INTRAVENOUS | Status: AC
  Administered 2013-09-14: 1.5 g via INTRAVENOUS
  Filled 2013-09-14: qty 1.5

## 2013-09-14 MED ORDER — HYDROMORPHONE HCL PF 1 MG/ML IJ SOLN
0.2500 mg | INTRAMUSCULAR | Status: DC | PRN
  Administered 2013-09-14: 0.5 mg via INTRAVENOUS
  Administered 2013-09-14 (×2): 0.25 mg via INTRAVENOUS

## 2013-09-14 MED ORDER — DEXTROSE 5 % IV SOLN
3.0000 g | INTRAVENOUS | Status: AC
  Administered 2013-09-14: 3 g via INTRAVENOUS
  Filled 2013-09-14 (×2): qty 3000

## 2013-09-14 MED ORDER — NEOSTIGMINE METHYLSULFATE 1 MG/ML IJ SOLN
INTRAMUSCULAR | Status: DC | PRN
  Administered 2013-09-14: 5 mg via INTRAVENOUS

## 2013-09-14 MED ORDER — ONDANSETRON HCL 4 MG/2ML IJ SOLN
INTRAMUSCULAR | Status: AC
  Filled 2013-09-14: qty 2

## 2013-09-14 MED ORDER — GLYCOPYRROLATE 0.2 MG/ML IJ SOLN
INTRAMUSCULAR | Status: DC | PRN
  Administered 2013-09-14: .8 mg via INTRAVENOUS

## 2013-09-14 MED ORDER — LACTATED RINGERS IV SOLN
INTRAVENOUS | Status: DC
  Administered 2013-09-14 – 2013-09-15 (×4): via INTRAVENOUS

## 2013-09-14 MED ORDER — BUPIVACAINE-EPINEPHRINE (PF) 0.5% -1:200000 IJ SOLN
INTRAMUSCULAR | Status: AC
  Filled 2013-09-14: qty 10

## 2013-09-14 MED ORDER — ROCURONIUM BROMIDE 100 MG/10ML IV SOLN
INTRAVENOUS | Status: DC | PRN
  Administered 2013-09-14: 40 mg via INTRAVENOUS

## 2013-09-14 MED ORDER — LACTATED RINGERS IV SOLN
INTRAVENOUS | Status: DC | PRN
  Administered 2013-09-14 (×2): via INTRAVENOUS

## 2013-09-14 MED ORDER — ARTIFICIAL TEARS OP OINT
TOPICAL_OINTMENT | OPHTHALMIC | Status: DC | PRN
  Administered 2013-09-14: 1 via OPHTHALMIC

## 2013-09-14 MED ORDER — ONDANSETRON HCL 4 MG/2ML IJ SOLN
INTRAMUSCULAR | Status: DC | PRN
  Administered 2013-09-14 (×2): 4 mg via INTRAVENOUS

## 2013-09-14 MED ORDER — PROPOFOL 10 MG/ML IV BOLUS
INTRAVENOUS | Status: DC | PRN
  Administered 2013-09-14: 150 mg via INTRAVENOUS

## 2013-09-14 MED ORDER — OXYCODONE-ACETAMINOPHEN 5-325 MG PO TABS: 1.0000 | ORAL_TABLET | ORAL | Status: DC | PRN

## 2013-09-14 MED ORDER — DEXAMETHASONE SODIUM PHOSPHATE 10 MG/ML IJ SOLN
INTRAMUSCULAR | Status: DC | PRN
  Administered 2013-09-14: 8 mg via INTRAVENOUS

## 2013-09-14 MED ORDER — BUPIVACAINE-EPINEPHRINE 0.25% -1:200000 IJ SOLN
INTRAMUSCULAR | Status: DC | PRN
  Administered 2013-09-14: 8 mL

## 2013-09-14 MED ORDER — FENTANYL CITRATE 0.05 MG/ML IJ SOLN
INTRAMUSCULAR | Status: DC | PRN
  Administered 2013-09-14 (×2): 50 ug via INTRAVENOUS

## 2013-09-14 MED ORDER — ONDANSETRON HCL 4 MG/2ML IJ SOLN
4.0000 mg | Freq: Once | INTRAMUSCULAR | Status: AC | PRN
  Administered 2013-09-14: 4 mg via INTRAVENOUS

## 2013-09-14 MED ORDER — SODIUM CHLORIDE 0.9 % IR SOLN
Status: DC | PRN
  Administered 2013-09-14: 1000 mL

## 2013-09-14 SURGICAL SUPPLY — 37 items
APPLIER CLIP ROT 10 11.4 M/L (STAPLE) ×2
BLADE SURG ROTATE 9660 (MISCELLANEOUS) IMPLANT
CANISTER SUCTION 2500CC (MISCELLANEOUS) ×2 IMPLANT
CHLORAPREP W/TINT 26ML (MISCELLANEOUS) ×2 IMPLANT
CLIP APPLIE ROT 10 11.4 M/L (STAPLE) ×1 IMPLANT
COVER MAYO STAND STRL (DRAPES) ×2 IMPLANT
COVER SURGICAL LIGHT HANDLE (MISCELLANEOUS) ×2 IMPLANT
DECANTER SPIKE VIAL GLASS SM (MISCELLANEOUS) ×4 IMPLANT
DERMABOND ADVANCED (GAUZE/BANDAGES/DRESSINGS) ×1
DERMABOND ADVANCED .7 DNX12 (GAUZE/BANDAGES/DRESSINGS) ×1 IMPLANT
DRAPE C-ARM 42X72 X-RAY (DRAPES) ×2 IMPLANT
DRAPE UTILITY 15X26 W/TAPE STR (DRAPE) ×4 IMPLANT
DRAPE WARM FLUID 44X44 (DRAPE) ×2 IMPLANT
ELECT REM PT RETURN 9FT ADLT (ELECTROSURGICAL) ×2
ELECTRODE REM PT RTRN 9FT ADLT (ELECTROSURGICAL) ×1 IMPLANT
GLOVE BIO SURGEON STRL SZ8 (GLOVE) ×2 IMPLANT
GLOVE BIOGEL PI IND STRL 8 (GLOVE) ×1 IMPLANT
GLOVE BIOGEL PI INDICATOR 8 (GLOVE) ×1
GOWN STRL NON-REIN LRG LVL3 (GOWN DISPOSABLE) ×8 IMPLANT
GOWN STRL REIN XL XLG (GOWN DISPOSABLE) ×2 IMPLANT
KIT BASIN OR (CUSTOM PROCEDURE TRAY) ×2 IMPLANT
KIT ROOM TURNOVER OR (KITS) ×2 IMPLANT
NS IRRIG 1000ML POUR BTL (IV SOLUTION) ×4 IMPLANT
PAD ARMBOARD 7.5X6 YLW CONV (MISCELLANEOUS) ×2 IMPLANT
POUCH SPECIMEN RETRIEVAL 10MM (ENDOMECHANICALS) ×2 IMPLANT
SCISSORS LAP 5X35 DISP (ENDOMECHANICALS) IMPLANT
SET CHOLANGIOGRAPH 5 50 .035 (SET/KITS/TRAYS/PACK) ×2 IMPLANT
SET IRRIG TUBING LAPAROSCOPIC (IRRIGATION / IRRIGATOR) ×2 IMPLANT
SLEEVE ENDOPATH XCEL 5M (ENDOMECHANICALS) ×2 IMPLANT
SPECIMEN JAR SMALL (MISCELLANEOUS) ×2 IMPLANT
SUT MNCRL AB 4-0 PS2 18 (SUTURE) ×2 IMPLANT
TOWEL OR 17X24 6PK STRL BLUE (TOWEL DISPOSABLE) ×2 IMPLANT
TOWEL OR 17X26 10 PK STRL BLUE (TOWEL DISPOSABLE) ×2 IMPLANT
TRAY LAPAROSCOPIC (CUSTOM PROCEDURE TRAY) ×2 IMPLANT
TROCAR XCEL BLUNT TIP 100MML (ENDOMECHANICALS) ×2 IMPLANT
TROCAR XCEL NON-BLD 11X100MML (ENDOMECHANICALS) ×2 IMPLANT
TROCAR XCEL NON-BLD 5MMX100MML (ENDOMECHANICALS) ×2 IMPLANT

## 2013-09-14 NOTE — Progress Notes (Signed)
Post-op VS taken upon return to 6N

## 2013-09-14 NOTE — Progress Notes (Signed)
Subjective:  No chest pain. Complains of stuffy nose and difficult to breathe through the nose.  Objective:  Vital Signs in the last 24 hours: Temp:  [97.3 F (36.3 C)-98.1 F (36.7 C)] 97.3 F (36.3 C) (11/12 0551) Pulse Rate:  [60-70] 63 (11/12 0551) Resp:  [18-30] 18 (11/12 0551) BP: (154-231)/(58-102) 186/77 mmHg (11/12 0647) SpO2:  [90 %-96 %] 96 % (11/12 0551)  Intake/Output from previous day: 11/11 0701 - 11/12 0700 In: 1957.5 [I.V.:1757.5; IV Piggyback:200] Out: -  Intake/Output from this shift:    . aspirin EC  81 mg Oral Daily  . carvedilol  18.75 mg Oral BID WC  .  ceFAZolin (ANCEF) IV  3 g Intravenous On Call to OR  . ciprofloxacin  400 mg Intravenous Q12H  . insulin aspart  0-5 Units Subcutaneous QHS  . insulin aspart  0-9 Units Subcutaneous TID WC  . lisinopril  20 mg Oral BID  . sodium chloride  3 mL Intravenous Q12H   . sodium chloride 75 mL/hr at 09/13/13 1650    Physical Exam: The patient appears to be in no distress.  Head and neck exam reveals that the pupils are equal and reactive.  The extraocular movements are full.  There is no scleral icterus.  Mouth and pharynx are benign.  No lymphadenopathy.  No carotid bruits.  The jugular venous pressure is normal.  Thyroid is not enlarged or tender.  Chest is clear to percussion and auscultation.  No rales or rhonchi.  Expansion of the chest is symmetrical.  Heart reveals no abnormal lift or heave.  First and second heart sounds are normal.  There is no murmur gallop rub or click.  The abdomen is soft and nontender.  Bowel sounds are normoactive.  There is no hepatosplenomegaly or mass.  There are no abdominal bruits.  Extremities reveal no phlebitis or edema.  Pedal pulses are good.  There is no cyanosis or clubbing.  Neurologic exam is normal strength and no lateralizing weakness.  No sensory deficits.  Integument reveals no rash  Lab Results:  Recent Labs  09/13/13 0635 09/14/13 0615    WBC 7.5 9.6  HGB 11.2* 11.0*  PLT 247 256    Recent Labs  09/13/13 0635 09/14/13 0615  NA 138 139  K 3.6 3.3*  CL 105 107  CO2 22 20  GLUCOSE 104* 97  BUN 14 13  CREATININE 0.81 0.73   No results found for this basename: TROPONINI, CK, MB,  in the last 72 hours Hepatic Function Panel  Recent Labs  09/14/13 0615  PROT 6.0  ALBUMIN 2.9*  AST 47*  ALT 68*  ALKPHOS 369*  BILITOT 3.4*  BILIDIR 2.2*  IBILI 1.2*   No results found for this basename: CHOL,  in the last 72 hours No results found for this basename: PROTIME,  in the last 72 hours  Imaging: US Abdomen Complete  09/12/2013   CLINICAL DATA:  Choledocholithiasis, elevated bilirubin and LFTs  EXAM: ULTRASOUND ABDOMEN COMPLETE  COMPARISON:  Concurrently obtained CT scan of the abdomen and pelvis performed earlier today  FINDINGS: Gallbladder  The gallbladder is distended. Echogenic shadowing debris consistent with sludge and small stones layers dependently. There is diffuse gallbladder wall thickening up to 7.3 mm. Trace pericholecystic fluid. Per the sonographer, sonographic Eulah Pont sign was negative.  Common bile duct  Diameter: Dilated up to 1.5 cm. The distal most common bile duct is obscured by overlying bowel gas. Small echogenic foci are  noted within the dilated common duct suggesting the presence of sludge and/ or small stones.  Liver  No focal lesion identified. Within normal limits in parenchymal echogenicity. Mild intrahepatic biliary ductal dilatation.  IVC  No abnormality visualized.  Pancreas  Visualized portion unremarkable. Calcification in the region the pancreatic tail is likely vascular in related to the splenic artery.  Spleen  Within normal limits for size. There are 3 sonographically simple cystic lesions. The largest measures up to 1.5 cm.  Right Kidney  Length: 10.6. Echogenicity within normal limits. No mass or hydronephrosis visualized. Mild nonspecific perinephric fluid.  Left Kidney  Length: 11.3 cm.  Echogenicity within normal limits. No mass or hydronephrosis visualized.  Abdominal aorta  No aneurysm visualized.  IMPRESSION: 1. Cholelithiasis with dilated intra and extrahepatic bile ducts. The gallbladder is distended in the gallbladder wall thickened with a small amount of pericholecystic fluid. The sonographic Eulah Pont sign was negative, however this finding is less sensitive in the elderly population. Findings are concerning for acute versus chronic cholecystitis. 2. Dilated common bile duct containing internal debris consistent with the findings of choledocholithiasis on the recently obtained CT scan of the abdomen and pelvis. Recommend a GI consultation if not already obtained for possible ERCP. 3. Sonographically simple splenic cysts.   Electronically Signed   By: Malachy Moan M.D.   On: 09/12/2013 15:53   Dg Ercp Biliary & Pancreatic Ducts  09/13/2013   CLINICAL DATA:  Choledocholithiasis.  EXAM: ERCP  TECHNIQUE: Multiple spot images obtained with the fluoroscopic device and submitted for interpretation post-procedure.  COMPARISON:  Ultrasound on 09/12/2013  FINDINGS: Imaging obtained by a C-arm during the procedure shows initial cannulation of the common bile duct with contrast injection demonstrating numerous filling defects in the distal aspect of the CBD. After stone extraction, cholangiogram shows no further visualized filling defects.  IMPRESSION: Choledocholithiasis with numerous calculi identified in the common bile duct. The stones were extracted.  These images were submitted for radiologic interpretation only. Please see the procedural report for the amount of contrast and the fluoroscopy time utilized.   Electronically Signed   By: Irish Lack M.D.   On: 09/13/2013 17:07    Cardiac Studies: Telemetry shows atrial paced rhythm. Assessment/Plan:  1. Cholelithiasis post ERCP 2. Sick sinus syndrome status post DDD pacemaker implant.  3. Coronary disease with remote stenting of  the mid and distal RCA in 2000. Patient is asymptomatic.  4. Hypertension-controlled  5. Hyperlipidemia on chronic statin therapy.   Plan: Awaiting cholecystectomy. Potassium repletion per primary service.   LOS: 2 days    [name] 09/14/2013, 8:15 AM

## 2013-09-14 NOTE — Anesthesia Procedure Notes (Signed)
Procedure Name: Intubation Date/Time: 09/14/2013 2:06 PM Performed by: Sherie Don Pre-anesthesia Checklist: Patient identified, Emergency Drugs available, Suction available, Patient being monitored and Timeout performed Patient Re-evaluated:Patient Re-evaluated prior to inductionOxygen Delivery Method: Circle system utilized Preoxygenation: Pre-oxygenation with 100% oxygen Intubation Type: IV induction Ventilation: Mask ventilation without difficulty and Oral airway inserted - appropriate to patient size Laryngoscope Size: Mac and 3 Grade View: Grade I Tube type: Oral Tube size: 7.0 mm Number of attempts: 1 Placement Confirmation: ETT inserted through vocal cords under direct vision and breath sounds checked- equal and bilateral Secured at: 21 cm Tube secured with: Tape Dental Injury: Teeth and Oropharynx as per pre-operative assessment

## 2013-09-14 NOTE — Progress Notes (Signed)
[facility] PROGRESS NOTE  Jessica [name]:[id number] DOB: [birth year] DOA: 09/12/2013 PCP: [name], MD  Assessment/Plan: 1-cholelithiasis/choledocholithiasis rule out any intraductal mass lesion, lipase is slightly high with transaminitis: Significantly improved  - ERCP: Choledocholithiasis s/p successful stone extraction. Cholangitis. -for cholecystectomy today.  - Will continue n.p.o. Status, IV fluids, pain control, and antiemetics  -Started on Ciprofloxacin day 2.   2-DM  - Placed on sliding scale insulin   3-Hypertriglyceridemia:  - Hold Crestor and omega-3 fatty acids due to transaminitis   4-CAD:  - Continue aspirin Coreg, lisinopril, hold statin  -Clear by cardio for procedure.   5-Pacemaker-Medtronic   6-Hypertension:  - Coreg and lisinopril  -Will start IV PRN hydralazine.   DVT prophylaxis: SCDs    Code Status: Full Code.  Family Communication: care discussed with patient, daughter at bedside.  Disposition Plan: to be determine   Consultants:  Surgery  GI  Procedures: ERCP: ENDOSCOPIC IMPRESSION:  1) Choledocholithiasis s/p successful stone extraction.  2) Cholangitis.    Antibiotics:  Ciprofloxacin 11-11  HPI/Subjective: Denies abdominal pain.  No nausea or vomiting. Feeling better.    Objective: Filed Vitals:   09/14/13 0902  BP: 166/61  Pulse: 63  Temp: 97.9 F (36.6 C)  Resp: 18    Intake/Output Summary (Last 24 hours) at 09/14/13 1317 Last data filed at 09/14/13 8119  Gross per 24 hour  Intake 1882.5 ml  Output      0 ml  Net 1882.5 ml   Filed Weights   09/12/13 0445 09/12/13 1227  Weight: 70.308 kg (155 lb) 69.854 kg (154 lb)    Exam:   General:  NAD  Cardiovascular: S 1, S 2 RRR  Respiratory: CTA  Abdomen: BS present, soft, nt  Musculoskeletal: no edema.   Data Reviewed: Basic Metabolic Panel:  Recent Labs Lab 09/12/13 0515 09/12/13 0520 09/13/13 0635 09/14/13 0615  NA 136 139 138  139  K 3.5 3.7 3.6 3.3*  CL 100 102 105 107  CO2 23  --  22 20  GLUCOSE 182* 191* 104* 97  BUN 15 15 14 13   CREATININE 0.66 0.90 0.81 0.73  CALCIUM 9.6  --  9.0 8.7   Liver Function Tests:  Recent Labs Lab 09/12/13 0515 09/14/13 0615  AST 45* 47*  ALT 70* 68*  ALKPHOS 295* 369*  BILITOT 3.1* 3.4*  PROT 7.3 6.0  ALBUMIN 3.6 2.9*    Recent Labs Lab 09/12/13 0515  LIPASE 81*   No results found for this basename: AMMONIA,  in the last 168 hours CBC:  Recent Labs Lab 09/12/13 0515 09/12/13 0520 09/13/13 0635 09/14/13 0615  WBC 11.9*  --  7.5 9.6  NEUTROABS 10.6*  --   --   --   HGB 12.2 11.6* 11.2* 11.0*  HCT 35.2* 34.0* 31.9* 32.0*  MCV 86.5  --  86.2 86.5  PLT 258  --  247 256   Cardiac Enzymes: No results found for this basename: CKTOTAL, CKMB, CKMBINDEX, TROPONINI,  in the last 168 hours BNP (last 3 results) No results found for this basename: PROBNP,  in the last 8760 hours CBG:  Recent Labs Lab 09/13/13 1146 09/13/13 1717 09/13/13 2121 09/14/13 0743 09/14/13 1215  GLUCAP 94 135* 138* 87 115*    Recent Results (from the past 240 hour(s))  URINE CULTURE     Status: None   Collection Time    09/12/13  6:39 AM      Result Value Range Status  Specimen Description URINE, CLEAN CATCH   Final   Special Requests NONE   Final   Culture  Setup Time     Final   Value: 09/12/2013 13:58     Performed at [facility]   Culture     Final   Value: >=100,000 COLONIES/mL ESCHERICHIA COLI     Performed at [facility]   Report Status 09/13/2013 FINAL   Final   Organism ID, Bacteria ESCHERICHIA COLI   Final     Studies: US Abdomen Complete  09/12/2013   CLINICAL DATA:  Choledocholithiasis, elevated bilirubin and LFTs  EXAM: ULTRASOUND ABDOMEN COMPLETE  COMPARISON:  Concurrently obtained CT scan of the abdomen and pelvis performed earlier today  FINDINGS: Gallbladder  The gallbladder is distended. Echogenic shadowing debris consistent with  sludge and small stones layers dependently. There is diffuse gallbladder wall thickening up to 7.3 mm. Trace pericholecystic fluid. Per the sonographer, sonographic Eulah Pont sign was negative.  Common bile duct  Diameter: Dilated up to 1.5 cm. The distal most common bile duct is obscured by overlying bowel gas. Small echogenic foci are noted within the dilated common duct suggesting the presence of sludge and/ or small stones.  Liver  No focal lesion identified. Within normal limits in parenchymal echogenicity. Mild intrahepatic biliary ductal dilatation.  IVC  No abnormality visualized.  Pancreas  Visualized portion unremarkable. Calcification in the region the pancreatic tail is likely vascular in related to the splenic artery.  Spleen  Within normal limits for size. There are 3 sonographically simple cystic lesions. The largest measures up to 1.5 cm.  Right Kidney  Length: 10.6. Echogenicity within normal limits. No mass or hydronephrosis visualized. Mild nonspecific perinephric fluid.  Left Kidney  Length: 11.3 cm. Echogenicity within normal limits. No mass or hydronephrosis visualized.  Abdominal aorta  No aneurysm visualized.  IMPRESSION: 1. Cholelithiasis with dilated intra and extrahepatic bile ducts. The gallbladder is distended in the gallbladder wall thickened with a small amount of pericholecystic fluid. The sonographic Eulah Pont sign was negative, however this finding is less sensitive in the elderly population. Findings are concerning for acute versus chronic cholecystitis. 2. Dilated common bile duct containing internal debris consistent with the findings of choledocholithiasis on the recently obtained CT scan of the abdomen and pelvis. Recommend a GI consultation if not already obtained for possible ERCP. 3. Sonographically simple splenic cysts.   Electronically Signed   By: Malachy Moan M.D.   On: 09/12/2013 15:53   Dg Ercp Biliary & Pancreatic Ducts  09/13/2013   CLINICAL DATA:   Choledocholithiasis.  EXAM: ERCP  TECHNIQUE: Multiple spot images obtained with the fluoroscopic device and submitted for interpretation post-procedure.  COMPARISON:  Ultrasound on 09/12/2013  FINDINGS: Imaging obtained by a C-arm during the procedure shows initial cannulation of the common bile duct with contrast injection demonstrating numerous filling defects in the distal aspect of the CBD. After stone extraction, cholangiogram shows no further visualized filling defects.  IMPRESSION: Choledocholithiasis with numerous calculi identified in the common bile duct. The stones were extracted.  These images were submitted for radiologic interpretation only. Please see the procedural report for the amount of contrast and the fluoroscopy time utilized.   Electronically Signed   By: Irish Lack M.D.   On: 09/13/2013 17:07    Scheduled Meds: . [facility] HOLD] aspirin EC  81 mg Oral Daily  . [facility] HOLD] carvedilol  18.75 mg Oral BID WC  . [facility] HOLD] ciprofloxacin  400 mg Intravenous Q12H  . [  MAR HOLD] insulin aspart  0-5 Units Subcutaneous QHS  . [MAR HOLD] insulin aspart  0-9 Units Subcutaneous TID WC  . [MAR HOLD] lisinopril  20 mg Oral BID  . [facility] HOLD] sodium chloride  3 mL Intravenous Q12H   Continuous Infusions: . sodium chloride 75 mL/hr at 09/13/13 1650  . lactated ringers 50 mL/hr at 09/14/13 1254    Principal Problem:   Abdominal pain Active Problems:   DM   HYPERCHOLESTEROLEMIA   CAD   Pacemaker-[employer]   Hypertension   Cholelithiasis    Time spent: 25 minutes.     REGALADO,BELKYS  [facility] Pager [phone]. If 7PM-7AM, please contact night-coverage at [email or web address], password [facility] 09/14/2013, 1:17 PM  LOS: 2 days

## 2013-09-14 NOTE — Anesthesia Preprocedure Evaluation (Signed)
Anesthesia Evaluation  Patient identified by MRN, date of birth, ID band Patient awake    Reviewed: Allergy & Precautions, H&P , NPO status , Patient's Chart, lab work & pertinent test results  Airway Mallampati: I  Neck ROM: Full    Dental   Pulmonary shortness of breath,  breath sounds clear to auscultation        Cardiovascular hypertension, + CAD, + Cardiac Stents and + DOE + dysrhythmias + pacemaker Rhythm:Regular Rate:Normal  Sick Sinus syn   Neuro/Psych    GI/Hepatic GERD-  ,  Endo/Other  diabetes  Renal/GU      Musculoskeletal   Abdominal   Peds  Hematology  (+) Blood dyscrasia, anemia ,   Anesthesia Other Findings   Reproductive/Obstetrics                           Anesthesia Physical Anesthesia Plan  ASA: III  Anesthesia Plan: General   Post-op Pain Management:    Induction: Intravenous  Airway Management Planned: Oral ETT  Additional Equipment:   Intra-op Plan:   Post-operative Plan: Extubation in OR  Informed Consent: I have reviewed the patients History and Physical, chart, labs and discussed the procedure including the risks, benefits and alternatives for the proposed anesthesia with the patient or authorized representative who has indicated his/her understanding and acceptance.   Dental advisory given  Plan Discussed with:   Anesthesia Plan Comments:         Anesthesia Quick Evaluation

## 2013-09-14 NOTE — Preoperative (Signed)
Beta Blockers   Reason not to administer Beta Blockers:Coreg taken this am 

## 2013-09-14 NOTE — Interval H&P Note (Signed)
History and Physical Interval Note:  09/14/2013 9:47 AM  Jessica [name]  has presented today for surgery, with the diagnosis of cholecystitis  The various methods of treatment have been discussed with the patient and family. After consideration of risks, benefits and other options for treatment, the patient has consented to  Procedure(s): LAPAROSCOPIC CHOLECYSTECTOMY WITH INTRAOPERATIVE CHOLANGIOGRAM (N/A) as a surgical intervention .  The patient's history has been reviewed, patient examined, no change in status, stable for surgery.  I have reviewed the patient's chart and labs.  Questions were answered to the patient's satisfaction.     [name] A.

## 2013-09-14 NOTE — Progress Notes (Signed)
1 Day Post-Op  Subjective: Having pain nose dry  Objective: Vital signs in last 24 hours: Temp:  [97.3 F (36.3 C)-98.1 F (36.7 C)] 97.3 F (36.3 C) (11/12 0551) Pulse Rate:  [60-70] 63 (11/12 0551) Resp:  [18-30] 18 (11/12 0551) BP: (154-231)/(58-102) 186/77 mmHg (11/12 0647) SpO2:  [90 %-96 %] 96 % (11/12 0551) Last BM Date: 09/13/13  Intake/Output from previous day: 11/11 0701 - 11/12 0700 In: 1957.5 [I.V.:1757.5; IV Piggyback:200] Out: -  Intake/Output this shift:    GI NEG MURPHY SIGN.  MILD TENDERNESS  Lab Results:   Recent Labs  09/13/13 0635 09/14/13 0615  WBC 7.5 9.6  HGB 11.2* 11.0*  HCT 31.9* 32.0*  PLT 247 256   BMET  Recent Labs  09/13/13 0635 09/14/13 0615  NA 138 139  K 3.6 3.3*  CL 105 107  CO2 22 20  GLUCOSE 104* 97  BUN 14 13  CREATININE 0.81 0.73  CALCIUM 9.0 8.7   PT/INR No results found for this basename: LABPROT, INR,  in the last 72 hours ABG No results found for this basename: PHART, PCO2, PO2, HCO3,  in the last 72 hours  Studies/Results: Us Abdomen Complete  09/12/2013   CLINICAL DATA:  Choledocholithiasis, elevated bilirubin and LFTs  EXAM: ULTRASOUND ABDOMEN COMPLETE  COMPARISON:  Concurrently obtained CT scan of the abdomen and pelvis performed earlier today  FINDINGS: Gallbladder  The gallbladder is distended. Echogenic shadowing debris consistent with sludge and small stones layers dependently. There is diffuse gallbladder wall thickening up to 7.3 mm. Trace pericholecystic fluid. Per the sonographer, sonographic Murphy sign was negative.  Common bile duct  Diameter: Dilated up to 1.5 cm. The distal most common bile duct is obscured by overlying bowel gas. Small echogenic foci are noted within the dilated common duct suggesting the presence of sludge and/ or small stones.  Liver  No focal lesion identified. Within normal limits in parenchymal echogenicity. Mild intrahepatic biliary ductal dilatation.  IVC  No abnormality  visualized.  Pancreas  Visualized portion unremarkable. Calcification in the region the pancreatic tail is likely vascular in related to the splenic artery.  Spleen  Within normal limits for size. There are 3 sonographically simple cystic lesions. The largest measures up to 1.5 cm.  Right Kidney  Length: 10.6. Echogenicity within normal limits. No mass or hydronephrosis visualized. Mild nonspecific perinephric fluid.  Left Kidney  Length: 11.3 cm. Echogenicity within normal limits. No mass or hydronephrosis visualized.  Abdominal aorta  No aneurysm visualized.  IMPRESSION: 1. Cholelithiasis with dilated intra and extrahepatic bile ducts. The gallbladder is distended in the gallbladder wall thickened with a small amount of pericholecystic fluid. The sonographic Murphy sign was negative, however this finding is less sensitive in the elderly population. Findings are concerning for acute versus chronic cholecystitis. 2. Dilated common bile duct containing internal debris consistent with the findings of choledocholithiasis on the recently obtained CT scan of the abdomen and pelvis. Recommend a GI consultation if not already obtained for possible ERCP. 3. Sonographically simple splenic cysts.   Electronically Signed   By: Heath  McCullough M.D.   On: 09/12/2013 15:53   Dg Ercp Biliary & Pancreatic Ducts  09/13/2013   CLINICAL DATA:  Choledocholithiasis.  EXAM: ERCP  TECHNIQUE: Multiple spot images obtained with the fluoroscopic device and submitted for interpretation post-procedure.  COMPARISON:  Ultrasound on 09/12/2013  FINDINGS: Imaging obtained by a C-arm during the procedure shows initial cannulation of the common bile duct with contrast injection demonstrating   numerous filling defects in the distal aspect of the CBD. After stone extraction, cholangiogram shows no further visualized filling defects.  IMPRESSION: Choledocholithiasis with numerous calculi identified in the common bile duct. The stones were  extracted.  These images were submitted for radiologic interpretation only. Please see the procedural report for the amount of contrast and the fluoroscopy time utilized.   Electronically Signed   By: Glenn  Yamagata M.D.   On: 09/13/2013 17:07    Anti-infectives: Anti-infectives   Start     Dose/Rate Route Frequency Ordered Stop   09/14/13 0730  ceFAZolin (ANCEF) 3 g in dextrose 5 % 50 mL IVPB     3 g 160 mL/hr over 30 Minutes Intravenous On call to O.R. 09/14/13 0724 09/15/13 0559   09/13/13 1700  ciprofloxacin (CIPRO) IVPB 400 mg     400 mg 200 mL/hr over 60 Minutes Intravenous Every 12 hours 09/13/13 1604     09/13/13 1400  ampicillin-sulbactam (UNASYN) 1.5 g in sodium chloride 0.9 % 50 mL IVPB  Status:  Discontinued     1.5 g 100 mL/hr over 30 Minutes Intravenous  Once 09/13/13 1350 09/13/13 1604      Assessment/Plan: s/p Procedure(s): ENDOSCOPIC RETROGRADE CHOLANGIOPANCREATOGRAPHY (ERCP) (N/A)  CHOLELITHIASIS CHOLEDOCOLITHIASIS  OR for lap chole/IOC.   The procedure has been discussed with the patient. Operative and non operative treatments have been discussed. Risks of surgery include bleeding, infection,  Common bile duct injury,  Injury to the stomach,liver, colon,small intestine, abdominal wall,  Diaphragm,  Major blood vessels,  And the need for an open procedure.  Other risks include worsening of medical problems, death,  DVT and pulmonary embolism, and cardiovascular events.   Medical options have also been discussed. The patient has been informed of long term expectations of surgery and non surgical options,  The patient agrees to proceed.    LOS: 2 days    Amay Mijangos A. 09/14/2013  

## 2013-09-14 NOTE — Anesthesia Postprocedure Evaluation (Signed)
Anesthesia Post Note  Patient: Jessica [name]  Procedure(s) Performed: Procedure(s) (LRB): LAPAROSCOPIC CHOLECYSTECTOMY WITH INTRAOPERATIVE CHOLANGIOGRAM (N/A)  Anesthesia type: General  Patient location: PACU  Post pain: Pain level controlled  Post assessment: Patient's Cardiovascular Status Stable  Last Vitals:  Filed Vitals:   09/14/13 1545  BP: 167/71  Pulse:   Temp:   Resp:     Post vital signs: Reviewed and stable  Level of consciousness: alert  Complications: No apparent anesthesia complications

## 2013-09-14 NOTE — Op Note (Signed)
Laparoscopic Cholecystectomy with IOC Procedure Note  Indications: This patient presents with symptomatic gallbladder disease and will undergo laparoscopic cholecystectomy.The procedure has been discussed with the patient. Operative and non operative treatments have been discussed. Risks of surgery include bleeding, infection,  Common bile duct injury,  Injury to the stomach,liver, colon,small intestine, abdominal wall,  Diaphragm,  Major blood vessels,  And the need for an open procedure.  Other risks include worsening of medical problems, death,  DVT and pulmonary embolism, and cardiovascular events.   Medical options have also been discussed. The patient has been informed of long term expectations of surgery and non surgical options,  The patient agrees to proceed.    Pre-operative Diagnosis: Calculus of gallbladder with acute cholecystitis, without mention of obstruction  Post-operative Diagnosis: Same  Surgeon: Deedra Pro A.   Assistants: OR staff  Anesthesia: General endotracheal anesthesia and Local anesthesia 0.25.% bupivacaine, with epinephrine  ASA Class: 3  Procedure Details  The patient was seen again in the Holding Room. The risks, benefits, complications, treatment options, and expected outcomes were discussed with the patient. The possibilities of reaction to medication, pulmonary aspiration, perforation of viscus, bleeding, recurrent infection, finding a normal gallbladder, the need for additional procedures, failure to diagnose a condition, the possible need to convert to an open procedure, and creating a complication requiring transfusion or operation were discussed with the patient. The patient and/or family concurred with the proposed plan, giving informed consent. The site of surgery properly noted/marked. The patient was taken to Operating Room, identified as Jessica [name] and the procedure verified as Laparoscopic Cholecystectomy with Intraoperative Cholangiograms. A Time  Out was held and the above information confirmed.  Prior to the induction of general anesthesia, antibiotic prophylaxis was administered. General endotracheal anesthesia was then administered and tolerated well. After the induction, the abdomen was prepped in the usual sterile fashion. The patient was positioned in the supine position with the left arm comfortably tucked, along with some reverse Trendelenburg.  Local anesthetic agent was injected into the skin near the umbilicus and an incision made. The midline fascia was incised and the Hasson technique was used to introduce a 12 mm port under direct vision. It was secured with a figure of eight Vicryl suture placed in the usual fashion. Pneumoperitoneum was then created with CO2 and tolerated well without any adverse changes in the patient's vital signs. Additional trocars were introduced under direct vision with an 11 mm trocar in the epigastrium and two  5 mm trocars in the right upper quadrant. All skin incisions were infiltrated with a local anesthetic agent before making the incision and placing the trocars.   The gallbladder was identified, the fundus grasped and retracted cephalad. Adhesions were lysed bluntly and with the electrocautery where indicated, taking care not to injure any adjacent organs or viscus. The infundibulum was grasped and retracted laterally, exposing the peritoneum overlying the triangle of Calot. This was then divided and exposed in a blunt fashion. The cystic duct was clearly identified and bluntly dissected circumferentially. The junctions of the gallbladder, cystic duct and common bile duct were clearly identified prior to the division of any linear structure.   An incision was made in the cystic duct and the cholangiogram catheter introduced. The catheter was secured using an endoclip. The study showed no stones and good visualization of the distal and proximal biliary tree. The catheter was then removed.   The cystic  duct was then  ligated with surgical clips  on the patient  side and  clipped on the gallbladder side and divided. The cystic artery was identified, dissected free, ligated with clips and divided as well. Posterior cystic artery clipped and divided.  The gallbladder was dissected from the liver bed in retrograde fashion with the electrocautery. The gallbladder was removed. The liver bed was irrigated and inspected. Hemostasis was achieved with the electrocautery. Copious irrigation was utilized and was repeatedly aspirated until clear all particulate matter. Hemostasis was achieved with no signs  Of bleeding or bile leakage. Gall bladder removed with endocatch bag through umbilicus.   Pneumoperitoneum was completely reduced after viewing removal of the trocars under direct vision. The wound was thoroughly irrigated and the fascia was then closed with a figure of eight suture; the skin was then closed with 4 O monocryl  and a sterile dressing was applied.  Instrument, sponge, and needle counts were correct at closure and at the conclusion of the case.   Findings: Cholecystitis with Cholelithiasis  Estimated Blood Loss: less than 50 mL                Total IV Fluids: 500 mL         Specimens: Gallbladder           Complications: None; patient tolerated the procedure well.         Disposition: PACU - hemodynamically stable.         Condition: stable

## 2013-09-14 NOTE — Transfer of Care (Signed)
Immediate Anesthesia Transfer of Care Note  Patient: Jessica [name]  Procedure(s) Performed: Procedure(s): LAPAROSCOPIC CHOLECYSTECTOMY WITH INTRAOPERATIVE CHOLANGIOGRAM (N/A)  Patient Location: PACU  Anesthesia Type:General  Level of Consciousness: awake and alert   Airway & Oxygen Therapy: Patient Spontanous Breathing and Patient connected to nasal cannula oxygen  Post-op Assessment: Report given to PACU RN and Post -op Vital signs reviewed and stable  Post vital signs: Reviewed and stable  Complications: No apparent anesthesia complications

## 2013-09-14 NOTE — H&P (View-Only) (Signed)
1 Day Post-Op  Subjective: Having pain nose dry  Objective: Vital signs in last 24 hours: Temp:  [97.3 F (36.3 C)-98.1 F (36.7 C)] 97.3 F (36.3 C) (11/12 0551) Pulse Rate:  [60-70] 63 (11/12 0551) Resp:  [18-30] 18 (11/12 0551) BP: (154-231)/(58-102) 186/77 mmHg (11/12 0647) SpO2:  [90 %-96 %] 96 % (11/12 0551) Last BM Date: 09/13/13  Intake/Output from previous day: 11/11 0701 - 11/12 0700 In: 1957.5 [I.V.:1757.5; IV Piggyback:200] Out: -  Intake/Output this shift:    GI NEG MURPHY SIGN.  MILD TENDERNESS  Lab Results:   Recent Labs  09/13/13 0635 09/14/13 0615  WBC 7.5 9.6  HGB 11.2* 11.0*  HCT 31.9* 32.0*  PLT 247 256   BMET  Recent Labs  09/13/13 0635 09/14/13 0615  NA 138 139  K 3.6 3.3*  CL 105 107  CO2 22 20  GLUCOSE 104* 97  BUN 14 13  CREATININE 0.81 0.73  CALCIUM 9.0 8.7   PT/INR No results found for this basename: LABPROT, INR,  in the last 72 hours ABG No results found for this basename: PHART, PCO2, PO2, HCO3,  in the last 72 hours  Studies/Results: US Abdomen Complete  09/12/2013   CLINICAL DATA:  Choledocholithiasis, elevated bilirubin and LFTs  EXAM: ULTRASOUND ABDOMEN COMPLETE  COMPARISON:  Concurrently obtained CT scan of the abdomen and pelvis performed earlier today  FINDINGS: Gallbladder  The gallbladder is distended. Echogenic shadowing debris consistent with sludge and small stones layers dependently. There is diffuse gallbladder wall thickening up to 7.3 mm. Trace pericholecystic fluid. Per the sonographer, sonographic Eulah Pont sign was negative.  Common bile duct  Diameter: Dilated up to 1.5 cm. The distal most common bile duct is obscured by overlying bowel gas. Small echogenic foci are noted within the dilated common duct suggesting the presence of sludge and/ or small stones.  Liver  No focal lesion identified. Within normal limits in parenchymal echogenicity. Mild intrahepatic biliary ductal dilatation.  IVC  No abnormality  visualized.  Pancreas  Visualized portion unremarkable. Calcification in the region the pancreatic tail is likely vascular in related to the splenic artery.  Spleen  Within normal limits for size. There are 3 sonographically simple cystic lesions. The largest measures up to 1.5 cm.  Right Kidney  Length: 10.6. Echogenicity within normal limits. No mass or hydronephrosis visualized. Mild nonspecific perinephric fluid.  Left Kidney  Length: 11.3 cm. Echogenicity within normal limits. No mass or hydronephrosis visualized.  Abdominal aorta  No aneurysm visualized.  IMPRESSION: 1. Cholelithiasis with dilated intra and extrahepatic bile ducts. The gallbladder is distended in the gallbladder wall thickened with a small amount of pericholecystic fluid. The sonographic Eulah Pont sign was negative, however this finding is less sensitive in the elderly population. Findings are concerning for acute versus chronic cholecystitis. 2. Dilated common bile duct containing internal debris consistent with the findings of choledocholithiasis on the recently obtained CT scan of the abdomen and pelvis. Recommend a GI consultation if not already obtained for possible ERCP. 3. Sonographically simple splenic cysts.   Electronically Signed   By: Malachy Moan M.D.   On: 09/12/2013 15:53   Dg Ercp Biliary & Pancreatic Ducts  09/13/2013   CLINICAL DATA:  Choledocholithiasis.  EXAM: ERCP  TECHNIQUE: Multiple spot images obtained with the fluoroscopic device and submitted for interpretation post-procedure.  COMPARISON:  Ultrasound on 09/12/2013  FINDINGS: Imaging obtained by a C-arm during the procedure shows initial cannulation of the common bile duct with contrast injection demonstrating  numerous filling defects in the distal aspect of the CBD. After stone extraction, cholangiogram shows no further visualized filling defects.  IMPRESSION: Choledocholithiasis with numerous calculi identified in the common bile duct. The stones were  extracted.  These images were submitted for radiologic interpretation only. Please see the procedural report for the amount of contrast and the fluoroscopy time utilized.   Electronically Signed   By: Irish Lack M.D.   On: 09/13/2013 17:07    Anti-infectives: Anti-infectives   Start     Dose/Rate Route Frequency Ordered Stop   09/14/13 0730  ceFAZolin (ANCEF) 3 g in dextrose 5 % 50 mL IVPB     3 g 160 mL/hr over 30 Minutes Intravenous On call to O.R. 09/14/13 0724 09/15/13 0559   09/13/13 1700  ciprofloxacin (CIPRO) IVPB 400 mg     400 mg 200 mL/hr over 60 Minutes Intravenous Every 12 hours 09/13/13 1604     09/13/13 1400  ampicillin-sulbactam (UNASYN) 1.5 g in sodium chloride 0.9 % 50 mL IVPB  Status:  Discontinued     1.5 g 100 mL/hr over 30 Minutes Intravenous  Once 09/13/13 1350 09/13/13 1604      Assessment/Plan: s/p Procedure(s): ENDOSCOPIC RETROGRADE CHOLANGIOPANCREATOGRAPHY (ERCP) (N/A)  CHOLELITHIASIS CHOLEDOCOLITHIASIS  OR for lap chole/IOC.   The procedure has been discussed with the patient. Operative and non operative treatments have been discussed. Risks of surgery include bleeding, infection,  Common bile duct injury,  Injury to the stomach,liver, colon,small intestine, abdominal wall,  Diaphragm,  Major blood vessels,  And the need for an open procedure.  Other risks include worsening of medical problems, death,  DVT and pulmonary embolism, and cardiovascular events.   Medical options have also been discussed. The patient has been informed of long term expectations of surgery and non surgical options,  The patient agrees to proceed.    LOS: 2 days    Lakeyia Surber A. 09/14/2013

## 2013-09-14 NOTE — Progress Notes (Signed)
Subjective: Complaining of a stuffy nose.  No able to clear her nose.  Objective: Vital signs in last 24 hours: Temp:  [97.3 F (36.3 C)-98.1 F (36.7 C)] 97.3 F (36.3 C) (11/12 0551) Pulse Rate:  [60-70] 63 (11/12 0551) Resp:  [18-30] 18 (11/12 0551) BP: (154-231)/(58-102) 186/77 mmHg (11/12 0647) SpO2:  [90 %-96 %] 96 % (11/12 0551) Last BM Date: 09/13/13  Intake/Output from previous day: 11/11 0701 - 11/12 0700 In: 1957.5 [I.V.:1757.5; IV Piggyback:200] Out: -  Intake/Output this shift:    General appearance: alert and mild distress GI: soft, non-tender; bowel sounds normal; no masses,  no organomegaly  Lab Results:  Recent Labs  09/12/13 0515 09/12/13 0520 09/13/13 0635 09/14/13 0615  WBC 11.9*  --  7.5 9.6  HGB 12.2 11.6* 11.2* 11.0*  HCT 35.2* 34.0* 31.9* 32.0*  PLT 258  --  247 256   BMET  Recent Labs  09/12/13 0515 09/12/13 0520 09/13/13 0635  NA 136 139 138  K 3.5 3.7 3.6  CL 100 102 105  CO2 23  --  22  GLUCOSE 182* 191* 104*  BUN 15 15 14   CREATININE 0.66 0.90 0.81  CALCIUM 9.6  --  9.0   LFT  Recent Labs  09/12/13 0515  PROT 7.3  ALBUMIN 3.6  AST 45*  ALT 70*  ALKPHOS 295*  BILITOT 3.1*   PT/INR No results found for this basename: LABPROT, INR,  in the last 72 hours Hepatitis Panel No results found for this basename: HEPBSAG, HCVAB, HEPAIGM, HEPBIGM,  in the last 72 hours C-Diff No results found for this basename: CDIFFTOX,  in the last 72 hours Fecal Lactopherrin No results found for this basename: FECLLACTOFRN,  in the last 72 hours  Studies/Results: US Abdomen Complete  09/12/2013   CLINICAL DATA:  Choledocholithiasis, elevated bilirubin and LFTs  EXAM: ULTRASOUND ABDOMEN COMPLETE  COMPARISON:  Concurrently obtained CT scan of the abdomen and pelvis performed earlier today  FINDINGS: Gallbladder  The gallbladder is distended. Echogenic shadowing debris consistent with sludge and small stones layers dependently. There is  diffuse gallbladder wall thickening up to 7.3 mm. Trace pericholecystic fluid. Per the sonographer, sonographic Eulah Pont sign was negative.  Common bile duct  Diameter: Dilated up to 1.5 cm. The distal most common bile duct is obscured by overlying bowel gas. Small echogenic foci are noted within the dilated common duct suggesting the presence of sludge and/ or small stones.  Liver  No focal lesion identified. Within normal limits in parenchymal echogenicity. Mild intrahepatic biliary ductal dilatation.  IVC  No abnormality visualized.  Pancreas  Visualized portion unremarkable. Calcification in the region the pancreatic tail is likely vascular in related to the splenic artery.  Spleen  Within normal limits for size. There are 3 sonographically simple cystic lesions. The largest measures up to 1.5 cm.  Right Kidney  Length: 10.6. Echogenicity within normal limits. No mass or hydronephrosis visualized. Mild nonspecific perinephric fluid.  Left Kidney  Length: 11.3 cm. Echogenicity within normal limits. No mass or hydronephrosis visualized.  Abdominal aorta  No aneurysm visualized.  IMPRESSION: 1. Cholelithiasis with dilated intra and extrahepatic bile ducts. The gallbladder is distended in the gallbladder wall thickened with a small amount of pericholecystic fluid. The sonographic Eulah Pont sign was negative, however this finding is less sensitive in the elderly population. Findings are concerning for acute versus chronic cholecystitis. 2. Dilated common bile duct containing internal debris consistent with the findings of choledocholithiasis on the recently obtained CT  scan of the abdomen and pelvis. Recommend a GI consultation if not already obtained for possible ERCP. 3. Sonographically simple splenic cysts.   Electronically Signed   By: Malachy Moan M.D.   On: 09/12/2013 15:53   Ct Abdomen Pelvis W Contrast  09/12/2013   CLINICAL DATA:  Diffuse lower abdominal pain.  EXAM: CT ABDOMEN AND PELVIS WITH CONTRAST   TECHNIQUE: Multidetector CT imaging of the abdomen and pelvis was performed using the standard protocol following bolus administration of intravenous contrast.  CONTRAST:  80mL OMNIPAQUE IOHEXOL 300 MG/ML  SOLN  COMPARISON:  None  FINDINGS: Visualization of the lower thorax demonstrates small, right greater than left, pleural effusions. Bilateral lower lung ground-glass opacities likely represent atelectasis. Heart is enlarged.  The liver is heterogeneous in attenuation. There is relative increased enhancement of the liver adjacent to the gallbladder. Additionally there is suggestion of a small amount of fluid insinuated between the gallbladder and the right hepatic lobe. Layering high attenuation material within the gallbladder lumen. Marked intrahepatic and extrahepatic biliary ductal dilatation with the common bile duct measuring up to 1.5 cm. Within the distal aspect of the common bile duct there are high attenuation filling defects measuring approximately 3.5 cm proximal to the ampulla.  Spleen is normal in size. There are 2 low-attenuation lesions within the spleen measuring 1.5 and 1.4 cm respectively. Mild atrophy of the pancreatic parenchyma. Mild thickening of the right adrenal gland. The left adrenal gland is grossly unremarkable. Kidneys enhance symmetrically with contrast. No hydronephrosis.  Normal caliber abdominal aorta with scattered calcified atherosclerotic plaque. Urinary bladder is unremarkable. Extensive descending and sigmoid colonic diverticulosis without CT evidence for acute diverticulitis. No abnormal bowel wall thickening. No evidence for bowel obstruction. No free fluid or free intraperitoneal air. Surgical clips within the anterior lower abdominal wall.  Lower lumbar spine degenerative change. No aggressive appearing osseous lesions.  IMPRESSION: 1. Marked intrahepatic and extrahepatic biliary ductal dilatation with the common bile duct measuring up to 1.5 cm. There are multiple high  density filling defects demonstrated within the distal aspect of the common bile duct, raising the possibility of choledocholithiasis. Intraductal mass lesion is an alternative possibility. Further evaluation with ERCP is recommended. 2. Cholelithiasis. Relative increased enhancement of the liver parenchyma adjacent to the gallbladder fossa with a small amount of fluid versus dilated duct insinuated between the gallbladder and the liver. Further evaluation/characterization of the gallbladder and associated hepatic parenchyma can be performed with gallbladder ultrasound as clinically indicated. 3. Small right greater than left bilateral pleural effusions with underlying atelectasis. 4. Two adjacent low attenuation splenic lesions as above. While these are likely benign (cyst, hemangioma) they demonstrate nonspecific imaging features and a followup MRI is recommended in 6 months to ensure stability.   Electronically Signed   By: Annia Belt M.D.   On: 09/12/2013 08:32   Dg Ercp Biliary & Pancreatic Ducts  09/13/2013   CLINICAL DATA:  Choledocholithiasis.  EXAM: ERCP  TECHNIQUE: Multiple spot images obtained with the fluoroscopic device and submitted for interpretation post-procedure.  COMPARISON:  Ultrasound on 09/12/2013  FINDINGS: Imaging obtained by a C-arm during the procedure shows initial cannulation of the common bile duct with contrast injection demonstrating numerous filling defects in the distal aspect of the CBD. After stone extraction, cholangiogram shows no further visualized filling defects.  IMPRESSION: Choledocholithiasis with numerous calculi identified in the common bile duct. The stones were extracted.  These images were submitted for radiologic interpretation only. Please see the procedural report for  the amount of contrast and the fluoroscopy time utilized.   Electronically Signed   By: Irish Lack M.D.   On: 09/13/2013 17:07    Medications:  Scheduled: . aspirin EC  81 mg Oral Daily   . carvedilol  18.75 mg Oral BID WC  .  ceFAZolin (ANCEF) IV  3 g Intravenous On Call to OR  . ciprofloxacin  400 mg Intravenous Q12H  . insulin aspart  0-5 Units Subcutaneous QHS  . insulin aspart  0-9 Units Subcutaneous TID WC  . lisinopril  20 mg Oral BID  . sodium chloride  3 mL Intravenous Q12H   Continuous: . sodium chloride 75 mL/hr at 09/13/13 1650    Assessment/Plan: 1) S/p choledocholithiasis. 2) Cholangitis. 3) Cholelithiasis.   No complications from the ERCP.  The patient is well at this time.  Plan: 1) Lap chole per Surgery.   LOS: 2 days   Keerthana Vanrossum D 09/14/2013, 7:50 AM

## 2013-09-15 ENCOUNTER — Encounter (HOSPITAL_COMMUNITY): Payer: Self-pay | Admitting: Surgery

## 2013-09-15 DIAGNOSIS — I10 Essential (primary) hypertension: Secondary | ICD-10-CM | POA: Diagnosis not present

## 2013-09-15 DIAGNOSIS — K8020 Calculus of gallbladder without cholecystitis without obstruction: Secondary | ICD-10-CM | POA: Diagnosis not present

## 2013-09-15 DIAGNOSIS — I119 Hypertensive heart disease without heart failure: Secondary | ICD-10-CM | POA: Diagnosis not present

## 2013-09-15 DIAGNOSIS — K829 Disease of gallbladder, unspecified: Secondary | ICD-10-CM | POA: Diagnosis not present

## 2013-09-15 LAB — COMPREHENSIVE METABOLIC PANEL
ALT: 55 U/L — ABNORMAL HIGH (ref 0–35)
AST: 48 U/L — ABNORMAL HIGH (ref 0–37)
CO2: 15 mEq/L — ABNORMAL LOW (ref 19–32)
Calcium: 8.5 mg/dL (ref 8.4–10.5)
Creatinine, Ser: 0.64 mg/dL (ref 0.50–1.10)
GFR calc Af Amer: >90 mL/min (ref >90)
GFR calc non Af Amer: 80 mL/min — ABNORMAL LOW (ref >90)
Total Protein: 5.9 g/dL — ABNORMAL LOW (ref 6.0–8.3)

## 2013-09-15 LAB — BASIC METABOLIC PANEL
CO2: 21 mEq/L (ref 19–32)
Calcium: 8.7 mg/dL (ref 8.4–10.5)
Creatinine, Ser: 0.92 mg/dL (ref 0.50–1.10)
Glucose, Bld: 201 mg/dL — ABNORMAL HIGH (ref 70–99)

## 2013-09-15 LAB — GLUCOSE, CAPILLARY
Glucose-Capillary: 157 mg/dL — ABNORMAL HIGH (ref 70–99)
Glucose-Capillary: 186 mg/dL — ABNORMAL HIGH (ref 70–99)
Glucose-Capillary: 122 mg/dL — ABNORMAL HIGH (ref 70–99)

## 2013-09-15 LAB — CBC
Hemoglobin: 11.4 g/dL — ABNORMAL LOW (ref 12.0–15.0)
MCV: 86.5 fL (ref 78.0–100.0)
Platelets: 269 10*3/uL (ref 150–400)
RBC: 3.79 MIL/uL — ABNORMAL LOW (ref 3.87–5.11)
RDW: 15.3 % (ref 11.5–15.5)
WBC: 16.4 10*3/uL — ABNORMAL HIGH (ref 4.0–10.5)

## 2013-09-15 MED ORDER — SODIUM CHLORIDE 0.9 % IV SOLN
INTRAVENOUS | Status: DC
  Administered 2013-09-15: 12:00:00 via INTRAVENOUS

## 2013-09-15 NOTE — Progress Notes (Signed)
[facility] PROGRESS NOTE  Jessica [name] FAO:[id number] DOB: 08-16-1927 DOA: 09/12/2013 PCP: [name], MD  Assessment/Plan: 1-cholelithiasis/choledocholithiasis:   - ERCP: Choledocholithiasis s/p successful stone extraction. Cholangitis. -S/P  cholecystectomy 11-12 - tolerating clear diet.  -Continue with Ciprofloxacin day 3.  -Increase WBC this morning. Will observed patient overnight.   2-DM  - Placed on sliding scale insulin   3-Hypertriglyceridemia:  - Hold Crestor and omega-3 fatty acids due to transaminitis   4-CAD:  - Continue aspirin Coreg, lisinopril, hold statin  -Clear by cardio for procedure.   5-Pacemaker-Medtronic   6-Hypertension:  - Coreg and lisinopril  -Will start IV PRN hydralazine.   DVT prophylaxis: SCDs    Code Status: Full Code.  Family Communication: care discussed with patient, daughter at bedside.  Disposition Plan: to be determine   Consultants:  Surgery  GI  Procedures: ERCP: ENDOSCOPIC IMPRESSION:  1) Choledocholithiasis s/p successful stone extraction.  2) Cholangitis.    Antibiotics:  Ciprofloxacin 11-11  HPI/Subjective: Denies abdominal pain. No chest pain.  No nausea or vomiting. Feeling better.    Objective: Filed Vitals:   09/15/13 0956  BP: 155/57  Pulse: 60  Temp: 97.2 F (36.2 C)  Resp: 16    Intake/Output Summary (Last 24 hours) at 09/15/13 1438 Last data filed at 09/15/13 0600  Gross per 24 hour  Intake   2255 ml  Output      0 ml  Net   2255 ml   Filed Weights   09/12/13 0445 09/12/13 1227  Weight: 70.308 kg (155 lb) 69.854 kg (154 lb)    Exam:   General:  NAD  Cardiovascular: S 1, S 2 RRR  Respiratory: CTA  Abdomen: BS present, soft, nt  Musculoskeletal: no edema.   Data Reviewed: Basic Metabolic Panel:  Recent Labs Lab 09/12/13 0515 09/12/13 0520 09/13/13 0635 09/14/13 0615 09/15/13 0012 09/15/13 1345  NA 136 139 138 139 135 135  K 3.5 3.7 3.6 3.3* 3.8  3.8  CL 100 102 105 107 103 103  CO2 23  --  22 20 15* 21  GLUCOSE 182* 191* 104* 97 124* 201*  BUN 15 15 14 13 14 21   CREATININE 0.66 0.90 0.81 0.73 0.64 0.92  CALCIUM 9.6  --  9.0 8.7 8.5 8.7   Liver Function Tests:  Recent Labs Lab 09/12/13 0515 09/14/13 0615 09/15/13 0012  AST 45* 47* 48*  ALT 70* 68* 55*  ALKPHOS 295* 369* 327*  BILITOT 3.1* 3.4* 2.4*  PROT 7.3 6.0 5.9*  ALBUMIN 3.6 2.9* 2.5*    Recent Labs Lab 09/12/13 0515  LIPASE 81*   No results found for this basename: AMMONIA,  in the last 168 hours CBC:  Recent Labs Lab 09/12/13 0515 09/12/13 0520 09/13/13 0635 09/14/13 0615 09/15/13 0140  WBC 11.9*  --  7.5 9.6 16.4*  NEUTROABS 10.6*  --   --   --   --   HGB 12.2 11.6* 11.2* 11.0* 11.4*  HCT 35.2* 34.0* 31.9* 32.0* 32.8*  MCV 86.5  --  86.2 86.5 86.5  PLT 258  --  247 256 269   Cardiac Enzymes: No results found for this basename: CKTOTAL, CKMB, CKMBINDEX, TROPONINI,  in the last 168 hours BNP (last 3 results) No results found for this basename: PROBNP,  in the last 8760 hours CBG:  Recent Labs Lab 09/14/13 1215 09/14/13 1736 09/14/13 2147 09/15/13 0727 09/15/13 1204  GLUCAP 115* 143* 126* 157* 186*    Recent Results (from the  past 240 hour(s))  URINE CULTURE     Status: None   Collection Time    09/12/13  6:39 AM      Result Value Range Status   Specimen Description URINE, CLEAN CATCH   Final   Special Requests NONE   Final   Culture  Setup Time     Final   Value: 09/12/2013 13:58     Performed at [facility]   Culture     Final   Value: >=100,000 COLONIES/mL ESCHERICHIA COLI     Performed at [facility]   Report Status 09/13/2013 FINAL   Final   Organism ID, Bacteria ESCHERICHIA COLI   Final     Studies: Dg Cholangiogram Operative  09/14/2013   CLINICAL DATA:  Cholelithiasis  EXAM: INTRAOPERATIVE CHOLANGIOGRAM  TECHNIQUE: Cholangiographic images from the C-arm fluoroscopic device were submitted for  interpretation post-operatively. Please see the procedural report for the amount of contrast and the fluoroscopy time utilized.  COMPARISON:  ERCP from the previous day  FINDINGS: Injection in the cystic duct remnant reveals no filling defects within the common bile duct. There is free flow contrast material into the duodenum. 16 seconds of fluoroscopy was utilized.   Electronically Signed   By: Alcide Clever M.D.   On: 09/14/2013 16:01   Dg Ercp Biliary & Pancreatic Ducts  09/13/2013   CLINICAL DATA:  Choledocholithiasis.  EXAM: ERCP  TECHNIQUE: Multiple spot images obtained with the fluoroscopic device and submitted for interpretation post-procedure.  COMPARISON:  Ultrasound on 09/12/2013  FINDINGS: Imaging obtained by a C-arm during the procedure shows initial cannulation of the common bile duct with contrast injection demonstrating numerous filling defects in the distal aspect of the CBD. After stone extraction, cholangiogram shows no further visualized filling defects.  IMPRESSION: Choledocholithiasis with numerous calculi identified in the common bile duct. The stones were extracted.  These images were submitted for radiologic interpretation only. Please see the procedural report for the amount of contrast and the fluoroscopy time utilized.   Electronically Signed   By: Irish Lack M.D.   On: 09/13/2013 17:07    Scheduled Meds: . carvedilol  18.75 mg Oral BID WC  . ciprofloxacin  400 mg Intravenous Q12H  . insulin aspart  0-5 Units Subcutaneous QHS  . insulin aspart  0-9 Units Subcutaneous TID WC  . lisinopril  20 mg Oral BID  . sodium chloride  3 mL Intravenous Q12H   Continuous Infusions: . sodium chloride 75 mL/hr at 09/15/13 1224    Principal Problem:   Abdominal pain Active Problems:   DM   HYPERCHOLESTEROLEMIA   CAD   Pacemaker-Medtronic   Hypertension   Cholelithiasis    Time spent: 25 minutes.     REGALADO,BELKYS  [facility] Pager [phone]. If 7PM-7AM,  please contact night-coverage at [email or web address], password [facility] 09/15/2013, 2:38 PM  LOS: 3 days

## 2013-09-15 NOTE — Progress Notes (Signed)
Subjective:  One day post-op cholecystectomy.  Doing well. No chest pain or dyspnea.  Objective:  Vital Signs in the last 24 hours: Temp:  [97 F (36.1 C)-98.2 F (36.8 C)] 97.2 F (36.2 C) (11/13 0956) Pulse Rate:  [59-79] 60 (11/13 0956) Resp:  [11-21] 16 (11/13 0956) BP: (127-179)/(46-87) 155/57 mmHg (11/13 0956) SpO2:  [96 %-99 %] 96 % (11/13 0956)  Intake/Output from previous day: 11/12 0701 - 11/13 0700 In: 2255 [I.V.:1855; IV Piggyback:400] Out: -  Intake/Output from this shift:    . carvedilol  18.75 mg Oral BID WC  . ciprofloxacin  400 mg Intravenous Q12H  . insulin aspart  0-5 Units Subcutaneous QHS  . insulin aspart  0-9 Units Subcutaneous TID WC  . lisinopril  20 mg Oral BID  . sodium chloride  3 mL Intravenous Q12H   . sodium chloride      Physical Exam: The patient appears to be in no distress.  The abdomen is soft and nontender.  Bowel sounds are normoactive.  There is no hepatosplenomegaly or mass.  There are no abdominal bruits. Laparoscopic sites look good.  Extremities reveal no phlebitis or edema.  Pedal pulses are good.  There is no cyanosis or clubbing.  Neurologic exam is normal strength and no lateralizing weakness.  No sensory deficits.  Integument reveals no rash  Lab Results:  Recent Labs  09/14/13 0615 09/15/13 0140  WBC 9.6 16.4*  HGB 11.0* 11.4*  PLT 256 269    Recent Labs  09/14/13 0615 09/15/13 0012  NA 139 135  K 3.3* 3.8  CL 107 103  CO2 20 15*  GLUCOSE 97 124*  BUN 13 14  CREATININE 0.73 0.64   No results found for this basename: TROPONINI, CK, MB,  in the last 72 hours Hepatic Function Panel  Recent Labs  09/14/13 0615 09/15/13 0012  PROT 6.0 5.9*  ALBUMIN 2.9* 2.5*  AST 47* 48*  ALT 68* 55*  ALKPHOS 369* 327*  BILITOT 3.4* 2.4*  BILIDIR 2.2*  --   IBILI 1.2*  --    No results found for this basename: CHOL,  in the last 72 hours No results found for this basename: PROTIME,  in the last 72  hours  Imaging: Dg Cholangiogram Operative  09/14/2013   CLINICAL DATA:  Cholelithiasis  EXAM: INTRAOPERATIVE CHOLANGIOGRAM  TECHNIQUE: Cholangiographic images from the C-arm fluoroscopic device were submitted for interpretation post-operatively. Please see the procedural report for the amount of contrast and the fluoroscopy time utilized.  COMPARISON:  ERCP from the previous day  FINDINGS: Injection in the cystic duct remnant reveals no filling defects within the common bile duct. There is free flow contrast material into the duodenum. 16 seconds of fluoroscopy was utilized.   Electronically Signed   By: Alcide Clever M.D.   On: 09/14/2013 16:01   Dg Ercp Biliary & Pancreatic Ducts  09/13/2013   CLINICAL DATA:  Choledocholithiasis.  EXAM: ERCP  TECHNIQUE: Multiple spot images obtained with the fluoroscopic device and submitted for interpretation post-procedure.  COMPARISON:  Ultrasound on 09/12/2013  FINDINGS: Imaging obtained by a C-arm during the procedure shows initial cannulation of the common bile duct with contrast injection demonstrating numerous filling defects in the distal aspect of the CBD. After stone extraction, cholangiogram shows no further visualized filling defects.  IMPRESSION: Choledocholithiasis with numerous calculi identified in the common bile duct. The stones were extracted.  These images were submitted for radiologic interpretation only. Please see the  procedural report for the amount of contrast and the fluoroscopy time utilized.   Electronically Signed   By: Irish Lack M.D.   On: 09/13/2013 17:07    Cardiac Studies: Telemetry shows atrial paced rhythm. Assessment/Plan:  1. S/P lap cholecystectomy doing well. 2. Sick sinus syndrome status post DDD pacemaker implant.  3. Coronary disease with remote stenting of the mid and distal RCA in 2000. Patient is asymptomatic.  4. Hypertension-controlled  5. Hyperlipidemia on chronic statin therapy.   Plan: Continue current  cardiac meds.   LOS: 3 days    [name] 09/15/2013, 11:46 AM

## 2013-09-15 NOTE — Progress Notes (Signed)
1 Day Post-Op   Assessment: s/p Procedure(s): LAPAROSCOPIC CHOLECYSTECTOMY WITH INTRAOPERATIVE CHOLANGIOGRAM Patient Active Problem List   Diagnosis Date Noted  . Abdominal pain 09/12/2013  . Cholelithiasis 09/12/2013  . Diabetic neuropathy 07/06/2013  . Diastolic dysfunction 02/11/2013  . SSS (sick sinus syndrome) 02/10/2013  . Hypertension 07/30/2012  . Pacemaker-Medtronic 07/19/2012  . Benign hypertensive heart disease without heart failure 01/28/2012  . DM 10/22/2010  . HYPERCHOLESTEROLEMIA 10/22/2010  . CAD 10/22/2010  . SICK SINUS SYNDROME 10/22/2010  . DEGENERATIVE JOINT DISEASE 10/22/2010    Improved frompre-op. slt increased wbc, probably from surgery  Plan: Advance diet Might be able to go home late today, but might be wise tokeeptill tomorrow and recheck cbc in am Subjective: Feels OK,not much pain, no nausea, tolerated liquids, says she feels better than preop  Objective: Vital signs in last 24 hours: Temp:  [97 F (36.1 C)-98.2 F (36.8 C)] 98.1 F (36.7 C) (11/13 0631) Pulse Rate:  [59-79] 70 (11/13 0631) Resp:  [11-21] 18 (11/13 0631) BP: (127-179)/(46-87) 152/55 mmHg (11/13 0631) SpO2:  [95 %-99 %] 99 % (11/13 0631)   Intake/Output from previous day: 11/12 0701 - 11/13 0700 In: 2255 [I.V.:1855; IV Piggyback:400] Out: -   General appearance: alert, cooperative, appears stated age and no distress Resp: clear to auscultation bilaterally Cardio: regular rate and rhythm, S1, S2 normal, no murmur, click, rub or gallop GI: soft, mildly distneded, mildly tender  Incision: healing well  Lab Results:   Recent Labs  09/14/13 0615 09/15/13 0140  WBC 9.6 16.4*  HGB 11.0* 11.4*  HCT 32.0* 32.8*  PLT 256 269   BMET  Recent Labs  09/14/13 0615 09/15/13 0012  NA 139 135  K 3.3* 3.8  CL 107 103  CO2 20 15*  GLUCOSE 97 124*  BUN 13 14  CREATININE 0.73 0.64  CALCIUM 8.7 8.5    MEDS, Scheduled . carvedilol  18.75 mg Oral BID WC  .  ciprofloxacin  400 mg Intravenous Q12H  . insulin aspart  0-5 Units Subcutaneous QHS  . insulin aspart  0-9 Units Subcutaneous TID WC  . lisinopril  20 mg Oral BID  . sodium chloride  3 mL Intravenous Q12H    Studies/Results: Dg Cholangiogram Operative  09/14/2013   CLINICAL DATA:  Cholelithiasis  EXAM: INTRAOPERATIVE CHOLANGIOGRAM  TECHNIQUE: Cholangiographic images from the C-arm fluoroscopic device were submitted for interpretation post-operatively. Please see the procedural report for the amount of contrast and the fluoroscopy time utilized.  COMPARISON:  ERCP from the previous day  FINDINGS: Injection in the cystic duct remnant reveals no filling defects within the common bile duct. There is free flow contrast material into the duodenum. 16 seconds of fluoroscopy was utilized.   Electronically Signed   By: Alcide Clever M.D.   On: 09/14/2013 16:01   Dg Ercp Biliary & Pancreatic Ducts  09/13/2013   CLINICAL DATA:  Choledocholithiasis.  EXAM: ERCP  TECHNIQUE: Multiple spot images obtained with the fluoroscopic device and submitted for interpretation post-procedure.  COMPARISON:  Ultrasound on 09/12/2013  FINDINGS: Imaging obtained by a C-arm during the procedure shows initial cannulation of the common bile duct with contrast injection demonstrating numerous filling defects in the distal aspect of the CBD. After stone extraction, cholangiogram shows no further visualized filling defects.  IMPRESSION: Choledocholithiasis with numerous calculi identified in the common bile duct. The stones were extracted.  These images were submitted for radiologic interpretation only. Please see the procedural report for the amount of contrast and  the fluoroscopy time utilized.   Electronically Signed   By: Irish Lack M.D.   On: 09/13/2013 17:07      LOS: 3 days     [name], MD, [facility] Surgery, [place] [phone]   09/15/2013 7:38 AM

## 2013-09-16 DIAGNOSIS — K8020 Calculus of gallbladder without cholecystitis without obstruction: Secondary | ICD-10-CM | POA: Diagnosis not present

## 2013-09-16 DIAGNOSIS — K829 Disease of gallbladder, unspecified: Secondary | ICD-10-CM | POA: Diagnosis not present

## 2013-09-16 DIAGNOSIS — I119 Hypertensive heart disease without heart failure: Secondary | ICD-10-CM | POA: Diagnosis not present

## 2013-09-16 LAB — CBC
HCT: 30.9 % — ABNORMAL LOW (ref 36.0–46.0)
MCH: 30.2 pg (ref 26.0–34.0)
MCHC: 35 g/dL (ref 30.0–36.0)
MCV: 86.3 fL (ref 78.0–100.0)
Platelets: 283 10*3/uL (ref 150–400)
RDW: 15.7 % — ABNORMAL HIGH (ref 11.5–15.5)

## 2013-09-16 LAB — BASIC METABOLIC PANEL
BUN: 26 mg/dL — ABNORMAL HIGH (ref 6–23)
Calcium: 8.6 mg/dL (ref 8.4–10.5)
Creatinine, Ser: 0.95 mg/dL (ref 0.50–1.10)
GFR calc Af Amer: 62 mL/min — ABNORMAL LOW (ref >90)
GFR calc non Af Amer: 53 mL/min — ABNORMAL LOW (ref >90)
Glucose, Bld: 166 mg/dL — ABNORMAL HIGH (ref 70–99)
Potassium: 3.9 mEq/L (ref 3.5–5.1)

## 2013-09-16 LAB — GLUCOSE, CAPILLARY: Glucose-Capillary: 141 mg/dL — ABNORMAL HIGH (ref 70–99)

## 2013-09-16 MED ORDER — TRAMADOL HCL 50 MG PO TABS: 50.0000 mg | ORAL_TABLET | Freq: Four times a day (QID) | ORAL | Status: DC | PRN

## 2013-09-16 MED ORDER — CIPROFLOXACIN HCL 500 MG PO TABS: 500.0000 mg | ORAL_TABLET | Freq: Two times a day (BID) | ORAL | Status: DC

## 2013-09-16 NOTE — Progress Notes (Signed)
LOOKS OK

## 2013-09-16 NOTE — Care Management Note (Signed)
  Page 1 of 1   09/16/2013     3:37:30 PM   CARE MANAGEMENT NOTE 09/16/2013  Patient:  Jessica [name], Jessica [name]   Account Number:  [id number]  Date Initiated:  09/16/2013  Documentation initiated by:  Ronny Flurry  Subjective/Objective Assessment:     Action/Plan:   Anticipated DC Date:  09/16/2013   Anticipated DC Plan:  HOME W HOME HEALTH SERVICES         Choice offered to / List presented to:  C-1 Patient        HH arranged  HH-1 RN  HH-2 PT      HH agency  [facility]   Status of service:  Completed, signed off Medicare Important Message given?   (If response is "NO", the following Medicare IM given date fields will be blank) Date Medicare IM given:   Date Additional Medicare IM given:    Discharge Disposition:  HOME W HOME HEALTH SERVICES  Per UR Regulation:    If discussed at Long Length of Stay Meetings, dates discussed:    Comments:

## 2013-09-16 NOTE — Progress Notes (Signed)
     Subjective:  Two days post-op cholecystectomy.  Doing well. No chest pain or dyspnea.  Objective:  Vital Signs in the last 24 hours: Temp:  [97.2 F (36.2 C)-97.6 F (36.4 C)] 97.6 F (36.4 C) (11/14 0508) Pulse Rate:  [60-84] 84 (11/14 0508) Resp:  [16-17] 16 (11/14 0508) BP: (133-157)/(54-87) 157/87 mmHg (11/14 0508) SpO2:  [95 %-97 %] 97 % (11/14 0508)  Intake/Output from previous day: 11/13 0701 - 11/14 0700 In: 400 [IV Piggyback:400] Out: -  Intake/Output from this shift:    . carvedilol  18.75 mg Oral BID WC  . ciprofloxacin  400 mg Intravenous Q12H  . insulin aspart  0-5 Units Subcutaneous QHS  . insulin aspart  0-9 Units Subcutaneous TID WC  . lisinopril  20 mg Oral BID  . sodium chloride  3 mL Intravenous Q12H      Physical Exam: The patient appears to be in no distress.  The abdomen is soft and nontender.  Bowel sounds are normoactive.  There is no hepatosplenomegaly or mass.  There are no abdominal bruits. Laparoscopic sites look good. Lungs clear to P and A Extremities reveal no phlebitis or edema.  Pedal pulses are good.  There is no cyanosis or clubbing.  Neurologic exam is normal strength and no lateralizing weakness.  No sensory deficits.  Integument reveals no rash  Lab Results:  Recent Labs  09/15/13 0140 09/16/13 0605  WBC 16.4* 13.9*  HGB 11.4* 10.8*  PLT 269 283    Recent Labs  09/15/13 1345 09/16/13 0605  NA 135 135  K 3.8 3.9  CL 103 103  CO2 21 20  GLUCOSE 201* 166*  BUN 21 26*  CREATININE 0.92 0.95   No results found for this basename: TROPONINI, CK, MB,  in the last 72 hours Hepatic Function Panel  Recent Labs  09/14/13 0615 09/15/13 0012  PROT 6.0 5.9*  ALBUMIN 2.9* 2.5*  AST 47* 48*  ALT 68* 55*  ALKPHOS 369* 327*  BILITOT 3.4* 2.4*  BILIDIR 2.2*  --   IBILI 1.2*  --    No results found for this basename: CHOL,  in the last 72 hours No results found for this basename: PROTIME,  in the last 72  hours  Imaging: Dg Cholangiogram Operative  09/14/2013   CLINICAL DATA:  Cholelithiasis  EXAM: INTRAOPERATIVE CHOLANGIOGRAM  TECHNIQUE: Cholangiographic images from the C-arm fluoroscopic device were submitted for interpretation post-operatively. Please see the procedural report for the amount of contrast and the fluoroscopy time utilized.  COMPARISON:  ERCP from the previous day  FINDINGS: Injection in the cystic duct remnant reveals no filling defects within the common bile duct. There is free flow contrast material into the duodenum. 16 seconds of fluoroscopy was utilized.   Electronically Signed   By: Alcide Clever M.D.   On: 09/14/2013 16:01    Cardiac Studies: Telemetry shows atrial paced rhythm. Assessment/Plan:  1. S/P lap cholecystectomy doing well. 2. Sick sinus syndrome status post DDD pacemaker implant.  3. Coronary disease with remote stenting of the mid and distal RCA in 2000. Patient is asymptomatic.  4. Hypertension-controlled  5. Hyperlipidemia on chronic statin therapy.   Plan: Continue current cardiac meds. DC telemetry. Okay for discharge home from cardiac standpoint. She will see me in December for OV  LOS: 4 days    Jessica [name] 09/16/2013, 8:32 AM

## 2013-09-16 NOTE — Discharge Summary (Signed)
Physician Discharge Summary  Jessica [name] ZOX:[id number] DOB: [birth year] DOA: 09/12/2013  PCP: [name], MD  Admit date: 09/12/2013 Discharge date: 09/16/2013  Time spent: 35 minutes  Recommendations for Outpatient Follow-up:  1. Need B-met to follow renal function, and to determine resume Lisinopril. 2. Need LFT, if normal resume statins.  3. Needs to follow up with surgery post cholecystectomy.    Discharge Diagnoses:    Cholelithiasis/choledocholithiasis   Cholangitis.    DM   HYPERCHOLESTEROLEMIA   CAD   Pacemaker-Medtronic   Hypertension   Cholelithiasis   Discharge Condition: Stable.   Diet recommendation: Heart Healthy  Filed Weights   09/12/13 0445 09/12/13 1227  Weight: 70.308 kg (155 lb) 69.854 kg (154 lb)    History of present illness:  Patient is 79 year old female with history of hypertension, diabetes, hyperlipidemia, SSS s/p pacemaker, coronary disease presented to ED with abdominal pain since yesterday. History was obtained from the patient who stated that she was in normal state of health until yesterday evening when she started developing acute onset of abdominal pain associated with nausea and abdominal distention. She denied any vomiting, fevers or chills, any diarrhea, or medication melena. She did describe intermittent dysuria but none today, no hematuria.  CT abdomen in the ER showed marked intrahepatic and extrahepatic dilatation and CBD 1.5 cm, multiple high-density filling defects in distal aspect of CBD raising the possibility of choledocholithiasis or possible intraductal mass lesion, recommend ERCP. Also cholelithiasis. General surgery was consulted in ED, who recommended ERCP and obtain abdominal ultrasound for further workup, may have future cholecystectomy.   Hospital Course:  1-cholelithiasis/choledocholithiasis:  - ERCP: Choledocholithiasis s/p successful stone extraction. Cholangitis.  -S/P cholecystectomy 11-12  - tolerating  regular  diet.  -Continue with Ciprofloxacin day 4/10.  -wbc decrease to 13 from 16.  -Had a Bowel movement.   2-DM  - Placed on sliding scale insulin  -resume metformin at discharge.   3-Hypertriglyceridemia:  - Hold Crestor and omega-3 fatty acids due to transaminitis  -Need repeat LFT outpatient.   4-CAD:  - Continue aspirin Coreg, l, hold statin  -Clear by cardio for procedure.   5-Pacemaker-Medtronic  6-Hypertension:  - Coreg. Will hold lisinopril due to mild increase BUN.   DVT prophylaxis: SCDs    Procedures: ERCP: ENDOSCOPIC IMPRESSION:  1) Choledocholithiasis s/p successful stone extraction.  2) Cholangitis.  Cholecystectomy 11-12  Consultations:  [facility],  GI  Discharge Exam: Filed Vitals:   09/16/13 0508  BP: 157/87  Pulse: 84  Temp: 97.6 F (36.4 C)  Resp: 16    General: No acute distress.  Cardiovascular: S 1, S 2 RRR Respiratory: CTA Abdomen: soft, incision healing.   Discharge Instructions  Discharge Orders   Future Appointments Provider Department Dept Phone   10/11/2013 2:30 PM [facility], [place] [phone]   Future Orders Complete By Expires   Diet - low sodium heart healthy  As directed    Increase activity slowly  As directed        Medication List    STOP taking these medications       lisinopril 10 MG tablet  Commonly known as:  PRINIVIL,ZESTRIL     rosuvastatin 10 MG tablet  Commonly known as:  CRESTOR      TAKE these medications       aspirin 81 MG tablet  Take 81 mg by mouth every morning.     CALCIUM + D PO  Take 1 tablet by  mouth every morning.     carvedilol 12.5 MG tablet  Commonly known as:  COREG  Take 18.75 mg by mouth 2 (two) times daily with a meal. Take 1 1/2 tablets twice daily     ciprofloxacin 500 MG tablet  Commonly known as:  CIPRO  Take 1 tablet (500 mg total) by mouth every 12 (twelve) hours.     fish oil-omega-3 fatty acids 1000 MG capsule  Take 1 g by  mouth every morning.     gabapentin 300 MG capsule  Commonly known as:  NEURONTIN  Take 1 capsule (300 mg total) by mouth daily.     LORazepam 1 MG tablet  Commonly known as:  ATIVAN  Take 0.5 tablets (0.5 mg total) by mouth every 8 (eight) hours as needed for anxiety. For anxiety     metFORMIN 500 MG tablet  Commonly known as:  GLUCOPHAGE  Take 500 mg by mouth daily with breakfast.     nitroGLYCERIN 0.4 MG SL tablet  Commonly known as:  NITROSTAT  Place 0.4 mg under the tongue every 5 (five) minutes as needed for chest pain. x3 doses as needed for chest pain     traMADol 50 MG tablet  Commonly known as:  ULTRAM  Take 1 tablet (50 mg total) by mouth every 6 (six) hours as needed.     VITAMIN B12 PO  Take 1 tablet by mouth daily.       Allergies  Allergen Reactions  . Lyrica [Pregabalin] Swelling    wgt gain  . Sulfonamide Derivatives Rash       Follow-up Information   Follow up with [facility] [place] On 10/11/2013. (arrive by 2pm for a 2:30appt.  this is a post operation check with surgery PA/NP)    Contact information:   [address]   [place] [address] [phone]       Follow up with [name], MD. Call in 3 days.   Specialty:  Cardiology   Contact information:   [address] [place] [address] [phone]        The results of significant diagnostics from this hospitalization (including imaging, microbiology, ancillary and laboratory) are listed below for reference.    Significant Diagnostic Studies: Dg Cholangiogram Operative  09/14/2013   CLINICAL DATA:  Cholelithiasis  EXAM: INTRAOPERATIVE CHOLANGIOGRAM  TECHNIQUE: Cholangiographic images from the C-arm fluoroscopic device were submitted for interpretation post-operatively. Please see the procedural report for the amount of contrast and the fluoroscopy time utilized.  COMPARISON:  ERCP from the previous day  FINDINGS: Injection in the cystic duct  remnant reveals no filling defects within the common bile duct. There is free flow contrast material into the duodenum. 16 seconds of fluoroscopy was utilized.   Electronically Signed   By: Alcide Clever M.D.   On: 09/14/2013 16:01   US Abdomen Complete  09/12/2013   CLINICAL DATA:  Choledocholithiasis, elevated bilirubin and LFTs  EXAM: ULTRASOUND ABDOMEN COMPLETE  COMPARISON:  Concurrently obtained CT scan of the abdomen and pelvis performed earlier today  FINDINGS: Gallbladder  The gallbladder is distended. Echogenic shadowing debris consistent with sludge and small stones layers dependently. There is diffuse gallbladder wall thickening up to 7.3 mm. Trace pericholecystic fluid. Per the sonographer, sonographic Eulah Pont sign was negative.  Common bile duct  Diameter: Dilated up to 1.5 cm. The distal most common bile duct is obscured by overlying bowel gas. Small echogenic foci are noted  within the dilated common duct suggesting the presence of sludge and/ or small stones.  Liver  No focal lesion identified. Within normal limits in parenchymal echogenicity. Mild intrahepatic biliary ductal dilatation.  IVC  No abnormality visualized.  Pancreas  Visualized portion unremarkable. Calcification in the region the pancreatic tail is likely vascular in related to the splenic artery.  Spleen  Within normal limits for size. There are 3 sonographically simple cystic lesions. The largest measures up to 1.5 cm.  Right Kidney  Length: 10.6. Echogenicity within normal limits. No mass or hydronephrosis visualized. Mild nonspecific perinephric fluid.  Left Kidney  Length: 11.3 cm. Echogenicity within normal limits. No mass or hydronephrosis visualized.  Abdominal aorta  No aneurysm visualized.  IMPRESSION: 1. Cholelithiasis with dilated intra and extrahepatic bile ducts. The gallbladder is distended in the gallbladder wall thickened with a small amount of pericholecystic fluid. The sonographic Eulah Pont sign was negative, however  this finding is less sensitive in the elderly population. Findings are concerning for acute versus chronic cholecystitis. 2. Dilated common bile duct containing internal debris consistent with the findings of choledocholithiasis on the recently obtained CT scan of the abdomen and pelvis. Recommend a GI consultation if not already obtained for possible ERCP. 3. Sonographically simple splenic cysts.   Electronically Signed   By: Malachy Moan M.D.   On: 09/12/2013 15:53   Ct Abdomen Pelvis W Contrast  09/12/2013   CLINICAL DATA:  Diffuse lower abdominal pain.  EXAM: CT ABDOMEN AND PELVIS WITH CONTRAST  TECHNIQUE: Multidetector CT imaging of the abdomen and pelvis was performed using the standard protocol following bolus administration of intravenous contrast.  CONTRAST:  80mL OMNIPAQUE IOHEXOL 300 MG/ML  SOLN  COMPARISON:  None  FINDINGS: Visualization of the lower thorax demonstrates small, right greater than left, pleural effusions. Bilateral lower lung ground-glass opacities likely represent atelectasis. Heart is enlarged.  The liver is heterogeneous in attenuation. There is relative increased enhancement of the liver adjacent to the gallbladder. Additionally there is suggestion of a small amount of fluid insinuated between the gallbladder and the right hepatic lobe. Layering high attenuation material within the gallbladder lumen. Marked intrahepatic and extrahepatic biliary ductal dilatation with the common bile duct measuring up to 1.5 cm. Within the distal aspect of the common bile duct there are high attenuation filling defects measuring approximately 3.5 cm proximal to the ampulla.  Spleen is normal in size. There are 2 low-attenuation lesions within the spleen measuring 1.5 and 1.4 cm respectively. Mild atrophy of the pancreatic parenchyma. Mild thickening of the right adrenal gland. The left adrenal gland is grossly unremarkable. Kidneys enhance symmetrically with contrast. No hydronephrosis.  Normal  caliber abdominal aorta with scattered calcified atherosclerotic plaque. Urinary bladder is unremarkable. Extensive descending and sigmoid colonic diverticulosis without CT evidence for acute diverticulitis. No abnormal bowel wall thickening. No evidence for bowel obstruction. No free fluid or free intraperitoneal air. Surgical clips within the anterior lower abdominal wall.  Lower lumbar spine degenerative change. No aggressive appearing osseous lesions.  IMPRESSION: 1. Marked intrahepatic and extrahepatic biliary ductal dilatation with the common bile duct measuring up to 1.5 cm. There are multiple high density filling defects demonstrated within the distal aspect of the common bile duct, raising the possibility of choledocholithiasis. Intraductal mass lesion is an alternative possibility. Further evaluation with ERCP is recommended. 2. Cholelithiasis. Relative increased enhancement of the liver parenchyma adjacent to the gallbladder fossa with a small amount of fluid versus dilated duct insinuated between the gallbladder and  the liver. Further evaluation/characterization of the gallbladder and associated hepatic parenchyma can be performed with gallbladder ultrasound as clinically indicated. 3. Small right greater than left bilateral pleural effusions with underlying atelectasis. 4. Two adjacent low attenuation splenic lesions as above. While these are likely benign (cyst, hemangioma) they demonstrate nonspecific imaging features and a followup MRI is recommended in 6 months to ensure stability.   Electronically Signed   By: Annia Belt M.D.   On: 09/12/2013 08:32   Dg Ercp Biliary & Pancreatic Ducts  09/13/2013   CLINICAL DATA:  Choledocholithiasis.  EXAM: ERCP  TECHNIQUE: Multiple spot images obtained with the fluoroscopic device and submitted for interpretation post-procedure.  COMPARISON:  Ultrasound on 09/12/2013  FINDINGS: Imaging obtained by a C-arm during the procedure shows initial cannulation of the  common bile duct with contrast injection demonstrating numerous filling defects in the distal aspect of the CBD. After stone extraction, cholangiogram shows no further visualized filling defects.  IMPRESSION: Choledocholithiasis with numerous calculi identified in the common bile duct. The stones were extracted.  These images were submitted for radiologic interpretation only. Please see the procedural report for the amount of contrast and the fluoroscopy time utilized.   Electronically Signed   By: Irish Lack M.D.   On: 09/13/2013 17:07    Microbiology: Recent Results (from the past 240 hour(s))  URINE CULTURE     Status: None   Collection Time    09/12/13  6:39 AM      Result Value Range Status   Specimen Description URINE, CLEAN CATCH   Final   Special Requests NONE   Final   Culture  Setup Time     Final   Value: 09/12/2013 13:58     Performed at [facility]   Culture     Final   Value: >=100,000 COLONIES/mL ESCHERICHIA COLI     Performed at [facility]   Report Status 09/13/2013 FINAL   Final   Organism ID, Bacteria ESCHERICHIA COLI   Final     Labs: Basic Metabolic Panel:  Recent Labs Lab 09/13/13 0635 09/14/13 0615 09/15/13 0012 09/15/13 1345 09/16/13 0605  NA 138 139 135 135 135  K 3.6 3.3* 3.8 3.8 3.9  CL 105 107 103 103 103  CO2 22 20 15* 21 20  GLUCOSE 104* 97 124* 201* 166*  BUN 14 13 14 21  26*  CREATININE 0.81 0.73 0.64 0.92 0.95  CALCIUM 9.0 8.7 8.5 8.7 8.6   Liver Function Tests:  Recent Labs Lab 09/12/13 0515 09/14/13 0615 09/15/13 0012  AST 45* 47* 48*  ALT 70* 68* 55*  ALKPHOS 295* 369* 327*  BILITOT 3.1* 3.4* 2.4*  PROT 7.3 6.0 5.9*  ALBUMIN 3.6 2.9* 2.5*    Recent Labs Lab 09/12/13 0515  LIPASE 81*   No results found for this basename: AMMONIA,  in the last 168 hours CBC:  Recent Labs Lab 09/12/13 0515 09/12/13 0520 09/13/13 0635 09/14/13 0615 09/15/13 0140 09/16/13 0605  WBC 11.9*  --  7.5 9.6 16.4*  13.9*  NEUTROABS 10.6*  --   --   --   --   --   HGB 12.2 11.6* 11.2* 11.0* 11.4* 10.8*  HCT 35.2* 34.0* 31.9* 32.0* 32.8* 30.9*  MCV 86.5  --  86.2 86.5 86.5 86.3  PLT 258  --  247 256 269 283   Cardiac Enzymes: No results found for this basename: CKTOTAL, CKMB, CKMBINDEX, TROPONINI,  in the last 168 hours BNP: BNP (last  3 results) No results found for this basename: PROBNP,  in the last 8760 hours CBG:  Recent Labs Lab 09/15/13 1204 09/15/13 1709 09/15/13 2156 09/16/13 0811 09/16/13 1209  GLUCAP 186* 165* 122* 141* 125*       Signed:  Zayla Agar  [facility] 09/16/2013, 12:33 PM

## 2013-09-16 NOTE — Progress Notes (Signed)
2 Days Post-Op  Subjective: Pt sitting up in chair eating breakfast.  Denies pain, n/v.  Tolerating diet.  +flatus, voiding.  Objective: Vital signs in last 24 hours: Temp:  [97.2 F (36.2 C)-97.6 F (36.4 C)] 97.6 F (36.4 C) (11/14 0508) Pulse Rate:  [60-84] 84 (11/14 0508) Resp:  [16-17] 16 (11/14 0508) BP: (133-157)/(54-87) 157/87 mmHg (11/14 0508) SpO2:  [95 %-97 %] 97 % (11/14 0508) Last BM Date: 09/13/13  Intake/Output from previous day: 11/13 0701 - 11/14 0700 In: 400 [IV Piggyback:400] Out: -  Intake/Output this shift:    General appearance: alert, cooperative, appears stated age and no distress Resp: clear to auscultation bilaterally Cardio: regular rate and rhythm, S1, S2 normal, no murmur, click, rub or gallop GI: +bs, abdomen is soft round and non tender. Incisions are c/d/i Extremities: extremities normal, atraumatic, no cyanosis or edema  Lab Results:   Recent Labs  09/15/13 0140 09/16/13 0605  WBC 16.4* 13.9*  HGB 11.4* 10.8*  HCT 32.8* 30.9*  PLT 269 283   BMET  Recent Labs  09/15/13 1345 09/16/13 0605  NA 135 135  K 3.8 3.9  CL 103 103  CO2 21 20  GLUCOSE 201* 166*  BUN 21 26*  CREATININE 0.92 0.95  CALCIUM 8.7 8.6   PT/INR No results found for this basename: LABPROT, INR,  in the last 72 hours ABG No results found for this basename: PHART, PCO2, PO2, HCO3,  in the last 72 hours  Studies/Results: Dg Cholangiogram Operative  09/14/2013   CLINICAL DATA:  Cholelithiasis  EXAM: INTRAOPERATIVE CHOLANGIOGRAM  TECHNIQUE: Cholangiographic images from the C-arm fluoroscopic device were submitted for interpretation post-operatively. Please see the procedural report for the amount of contrast and the fluoroscopy time utilized.  COMPARISON:  ERCP from the previous day  FINDINGS: Injection in the cystic duct remnant reveals no filling defects within the common bile duct. There is free flow contrast material into the duodenum. 16 seconds of  fluoroscopy was utilized.   Electronically Signed   By: Alcide Clever M.D.   On: 09/14/2013 16:01    Anti-infectives: Anti-infectives   Start     Dose/Rate Route Frequency Ordered Stop   09/14/13 1400  ampicillin-sulbactam (UNASYN) 1.5 g in sodium chloride 0.9 % 50 mL IVPB     1.5 g 100 mL/hr over 30 Minutes Intravenous To Surgery 09/14/13 1351 09/14/13 1410   09/14/13 0730  ceFAZolin (ANCEF) 3 g in dextrose 5 % 50 mL IVPB     3 g 160 mL/hr over 30 Minutes Intravenous On call to O.R. 09/14/13 0724 09/14/13 1038   09/13/13 1700  ciprofloxacin (CIPRO) IVPB 400 mg     400 mg 200 mL/hr over 60 Minutes Intravenous Every 12 hours 09/13/13 1604     09/13/13 1400  ampicillin-sulbactam (UNASYN) 1.5 g in sodium chloride 0.9 % 50 mL IVPB  Status:  Discontinued     1.5 g 100 mL/hr over 30 Minutes Intravenous  Once 09/13/13 1350 09/13/13 1604      Assessment/Plan: 79 year old female with a history of CAD, DM, HTN, SSS with permanent pacemaker   Choledocholithiasis  Cholelithiasis  Abnormal liver enzymes  Acute cholecystitis   S/p Laparoscopic Cholecystectomy Dr. Luisa Hart 09/14/13  -POD #2 -Tolerating diet, pain well controlled.  Would recommend tylenol and tramadol prn for pain -Cipro D#4 she needs a total of 7 days of therapy -she is stable for discharge from surgical standpoint. -she is scheduled in [facility] clinic on 10/11/13 at 2:30  LOS: 4 days    Bonner Puna [facility] ANP-BC Pager [phone] 09/16/2013 8:52 AM

## 2013-09-16 NOTE — Progress Notes (Signed)
DC instructions gone over with patient and dtr, questions answered, verbalized understanding.  Rx given to daughter.  Patient transported to front of hospital via wheelchair to be taken home by dtr.  [facility] to follow patient at home.  Patient in good condition at time of discharge.

## 2013-09-19 ENCOUNTER — Encounter (HOSPITAL_COMMUNITY): Payer: Self-pay | Admitting: Surgery

## 2013-09-19 ENCOUNTER — Telehealth: Payer: Self-pay | Admitting: Cardiology

## 2013-09-19 DIAGNOSIS — E1149 Type 2 diabetes mellitus with other diabetic neurological complication: Secondary | ICD-10-CM | POA: Diagnosis not present

## 2013-09-19 DIAGNOSIS — I10 Essential (primary) hypertension: Secondary | ICD-10-CM | POA: Diagnosis not present

## 2013-09-19 DIAGNOSIS — G909 Disorder of the autonomic nervous system, unspecified: Secondary | ICD-10-CM | POA: Diagnosis not present

## 2013-09-19 DIAGNOSIS — L89109 Pressure ulcer of unspecified part of back, unspecified stage: Secondary | ICD-10-CM | POA: Diagnosis not present

## 2013-09-19 DIAGNOSIS — Z853 Personal history of malignant neoplasm of breast: Secondary | ICD-10-CM | POA: Diagnosis not present

## 2013-09-19 DIAGNOSIS — I2510 Atherosclerotic heart disease of native coronary artery without angina pectoris: Secondary | ICD-10-CM | POA: Diagnosis not present

## 2013-09-19 DIAGNOSIS — L89309 Pressure ulcer of unspecified buttock, unspecified stage: Secondary | ICD-10-CM | POA: Diagnosis not present

## 2013-09-19 DIAGNOSIS — L8991 Pressure ulcer of unspecified site, stage 1: Secondary | ICD-10-CM | POA: Diagnosis not present

## 2013-09-19 NOTE — Telephone Encounter (Signed)
New Problem:  Robin, [facility] RN, states the pt came home from the hospital recently with three stage 1 pressure ulcers on her bottom. Zella Ball is requesting orders for wound care and for orders for the pt to have a Child psychotherapist. Please call Robin back.

## 2013-09-19 NOTE — Telephone Encounter (Signed)
Jessica [name] was told that I will forward to Dr Patty Sermons pt does not have a PCP.

## 2013-09-19 NOTE — Telephone Encounter (Signed)
Yes please order social worker and wound care

## 2013-09-19 NOTE — Telephone Encounter (Signed)
msg left to go ahead and sign paperwork/ orders to sign.

## 2013-09-23 DIAGNOSIS — E1149 Type 2 diabetes mellitus with other diabetic neurological complication: Secondary | ICD-10-CM | POA: Diagnosis not present

## 2013-09-23 DIAGNOSIS — L8991 Pressure ulcer of unspecified site, stage 1: Secondary | ICD-10-CM | POA: Diagnosis not present

## 2013-09-23 DIAGNOSIS — G909 Disorder of the autonomic nervous system, unspecified: Secondary | ICD-10-CM | POA: Diagnosis not present

## 2013-09-23 DIAGNOSIS — L89309 Pressure ulcer of unspecified buttock, unspecified stage: Secondary | ICD-10-CM | POA: Diagnosis not present

## 2013-09-23 DIAGNOSIS — L89109 Pressure ulcer of unspecified part of back, unspecified stage: Secondary | ICD-10-CM | POA: Diagnosis not present

## 2013-09-27 DIAGNOSIS — L89309 Pressure ulcer of unspecified buttock, unspecified stage: Secondary | ICD-10-CM | POA: Diagnosis not present

## 2013-09-27 DIAGNOSIS — L8991 Pressure ulcer of unspecified site, stage 1: Secondary | ICD-10-CM | POA: Diagnosis not present

## 2013-09-27 DIAGNOSIS — L89109 Pressure ulcer of unspecified part of back, unspecified stage: Secondary | ICD-10-CM | POA: Diagnosis not present

## 2013-09-27 DIAGNOSIS — G909 Disorder of the autonomic nervous system, unspecified: Secondary | ICD-10-CM | POA: Diagnosis not present

## 2013-09-27 DIAGNOSIS — E1149 Type 2 diabetes mellitus with other diabetic neurological complication: Secondary | ICD-10-CM | POA: Diagnosis not present

## 2013-09-28 ENCOUNTER — Telehealth: Payer: Self-pay | Admitting: Cardiology

## 2013-09-28 NOTE — Telephone Encounter (Signed)
Follow up    Pt's daughter calling about pt's BP and the meds.   They need a call back   BP  187/130  On 11/25,   11/24  155/72  .     Pt's daughter need a letter for her POB paper work.

## 2013-09-28 NOTE — Telephone Encounter (Signed)
Patients blood pressure has been elevated per daughter since before and after surgery. Will forward to  Dr. Patty Sermons for review

## 2013-09-28 NOTE — Telephone Encounter (Signed)
Left message to call back  

## 2013-09-28 NOTE — Telephone Encounter (Signed)
New message     FYI from home health nurse- At home visit her bp was 188/92.  Ms [name]'s daughter stated that her bp has been high for about 2 weeks.  Pt stated that the doctor took her off of her lisinopril.

## 2013-09-28 NOTE — Telephone Encounter (Signed)
Prior to her surgery she was on lisinopril.  Since her blood pressure is running high, restart lisinopril 10 mg one daily.  If blood pressure remains high she can increase it to 10 mg twice a day

## 2013-09-28 NOTE — Telephone Encounter (Signed)
Advised daughter and left message on [facility] voicemail

## 2013-09-29 DIAGNOSIS — G909 Disorder of the autonomic nervous system, unspecified: Secondary | ICD-10-CM | POA: Diagnosis not present

## 2013-09-29 DIAGNOSIS — L89309 Pressure ulcer of unspecified buttock, unspecified stage: Secondary | ICD-10-CM | POA: Diagnosis not present

## 2013-09-29 DIAGNOSIS — L89109 Pressure ulcer of unspecified part of back, unspecified stage: Secondary | ICD-10-CM | POA: Diagnosis not present

## 2013-09-29 DIAGNOSIS — E1149 Type 2 diabetes mellitus with other diabetic neurological complication: Secondary | ICD-10-CM | POA: Diagnosis not present

## 2013-09-29 DIAGNOSIS — L8991 Pressure ulcer of unspecified site, stage 1: Secondary | ICD-10-CM | POA: Diagnosis not present

## 2013-09-30 ENCOUNTER — Telehealth: Payer: Self-pay | Admitting: Cardiology

## 2013-09-30 DIAGNOSIS — L8991 Pressure ulcer of unspecified site, stage 1: Secondary | ICD-10-CM | POA: Diagnosis not present

## 2013-09-30 DIAGNOSIS — L89109 Pressure ulcer of unspecified part of back, unspecified stage: Secondary | ICD-10-CM | POA: Diagnosis not present

## 2013-09-30 DIAGNOSIS — G909 Disorder of the autonomic nervous system, unspecified: Secondary | ICD-10-CM | POA: Diagnosis not present

## 2013-09-30 DIAGNOSIS — L89309 Pressure ulcer of unspecified buttock, unspecified stage: Secondary | ICD-10-CM | POA: Diagnosis not present

## 2013-09-30 DIAGNOSIS — E1149 Type 2 diabetes mellitus with other diabetic neurological complication: Secondary | ICD-10-CM | POA: Diagnosis not present

## 2013-09-30 MED ORDER — LISINOPRIL 10 MG PO TABS: ORAL_TABLET | ORAL | Status: DC

## 2013-09-30 NOTE — Telephone Encounter (Signed)
Daughter was not able to find Lisinopril, new Rx sent to [facility]

## 2013-09-30 NOTE — Telephone Encounter (Signed)
New Problem:  Pt's daughter is calling about the pt's medication. Questions about lesinopril.... Pt's daughter states her mom was taking lesinopril. Pt's daughter wants to know if she can be put back on the medication.

## 2013-10-03 DIAGNOSIS — L8991 Pressure ulcer of unspecified site, stage 1: Secondary | ICD-10-CM | POA: Diagnosis not present

## 2013-10-03 DIAGNOSIS — L89109 Pressure ulcer of unspecified part of back, unspecified stage: Secondary | ICD-10-CM | POA: Diagnosis not present

## 2013-10-03 DIAGNOSIS — E1149 Type 2 diabetes mellitus with other diabetic neurological complication: Secondary | ICD-10-CM | POA: Diagnosis not present

## 2013-10-03 DIAGNOSIS — L89309 Pressure ulcer of unspecified buttock, unspecified stage: Secondary | ICD-10-CM | POA: Diagnosis not present

## 2013-10-03 DIAGNOSIS — G909 Disorder of the autonomic nervous system, unspecified: Secondary | ICD-10-CM | POA: Diagnosis not present

## 2013-10-05 DIAGNOSIS — L89309 Pressure ulcer of unspecified buttock, unspecified stage: Secondary | ICD-10-CM | POA: Diagnosis not present

## 2013-10-05 DIAGNOSIS — L8991 Pressure ulcer of unspecified site, stage 1: Secondary | ICD-10-CM | POA: Diagnosis not present

## 2013-10-05 DIAGNOSIS — G909 Disorder of the autonomic nervous system, unspecified: Secondary | ICD-10-CM | POA: Diagnosis not present

## 2013-10-05 DIAGNOSIS — L89109 Pressure ulcer of unspecified part of back, unspecified stage: Secondary | ICD-10-CM | POA: Diagnosis not present

## 2013-10-05 DIAGNOSIS — E1149 Type 2 diabetes mellitus with other diabetic neurological complication: Secondary | ICD-10-CM | POA: Diagnosis not present

## 2013-10-10 ENCOUNTER — Encounter: Payer: Self-pay | Admitting: Cardiology

## 2013-10-10 ENCOUNTER — Ambulatory Visit (INDEPENDENT_AMBULATORY_CARE_PROVIDER_SITE_OTHER): Payer: Medicare Other | Admitting: Cardiology

## 2013-10-10 VITALS — BP 160/96 | HR 77 | Ht 64.0 in | Wt 155.0 lb

## 2013-10-10 DIAGNOSIS — I519 Heart disease, unspecified: Secondary | ICD-10-CM

## 2013-10-10 DIAGNOSIS — E7800 Pure hypercholesterolemia, unspecified: Secondary | ICD-10-CM | POA: Diagnosis not present

## 2013-10-10 DIAGNOSIS — I10 Essential (primary) hypertension: Secondary | ICD-10-CM | POA: Diagnosis not present

## 2013-10-10 DIAGNOSIS — R5381 Other malaise: Secondary | ICD-10-CM

## 2013-10-10 DIAGNOSIS — I495 Sick sinus syndrome: Secondary | ICD-10-CM

## 2013-10-10 DIAGNOSIS — I5189 Other ill-defined heart diseases: Secondary | ICD-10-CM

## 2013-10-10 DIAGNOSIS — I119 Hypertensive heart disease without heart failure: Secondary | ICD-10-CM | POA: Diagnosis not present

## 2013-10-10 MED ORDER — LISINOPRIL 20 MG PO TABS: 20.0000 mg | ORAL_TABLET | Freq: Two times a day (BID) | ORAL | Status: DC

## 2013-10-10 NOTE — Patient Instructions (Addendum)
Will obtain labs today and call you with the results (lp/bmet/hfp/cbc)  INCREASE YOUR LISINOPRIL  TO 20 MG TWICE A DAY  Your physician recommends that you schedule a follow-up appointment in: 1 MONTH OV/BMET

## 2013-10-10 NOTE — Assessment & Plan Note (Signed)
Blood pressure is still running high at home.  She is back on her lisinopril 10 mg twice a day .  She is not yet back on her statin drug

## 2013-10-10 NOTE — Progress Notes (Signed)
Jessica [name] Date of Birth:  1927-09-25 [address] [place], [place]  [address] [phone]         Fax   [phone]  History of Present Illness: This pleasant 79 year old woman is seen for a four-month followup office visit. She has a complex past medical history. She has a history of sick sinus syndrome and has a permanent pacemaker. She has had no further episodes of syncope since the pacemaker implantation. She has a history of ischemic heart disease and underwent insertion of 2 drug-eluting stents placed in the mid and distal right coronary artery in September 2000. She has a history of diabetes, essential hypertension, and high cholesterol. An echocardiogram in September 2011 showed normal ejection fraction of 60-65% with diastolic dysfunction.  She was recently hospitalized with acute cholecystitis.  She underwent surgery by Dr. Luisa Hart.  She has had a good recovery.  She is getting her strength back.  Her appetite is improving..   Current Outpatient Prescriptions  Medication Sig Dispense Refill  . aspirin 81 MG tablet Take 81 mg by mouth every morning.       . Calcium Carbonate-Vitamin D (CALCIUM + D PO) Take 1 tablet by mouth every morning.       . carvedilol (COREG) 12.5 MG tablet Take 18.75 mg by mouth 2 (two) times daily with a meal. Take 1 1/2 tablets twice daily      . ciprofloxacin (CIPRO) 500 MG tablet Take 1 tablet (500 mg total) by mouth every 12 (twelve) hours.  14 tablet  0  . Cyanocobalamin (VITAMIN B12 PO) Take 1 tablet by mouth daily.       . fish oil-omega-3 fatty acids 1000 MG capsule Take 1 g by mouth every morning.       . gabapentin (NEURONTIN) 300 MG capsule Take 1 capsule (300 mg total) by mouth daily.  30 capsule  prn  . lisinopril (PRINIVIL,ZESTRIL) 20 MG tablet Take 1 tablet (20 mg total) by mouth 2 (two) times daily.  60 tablet  5  . LORazepam (ATIVAN) 1 MG tablet Take 0.5 tablets (0.5 mg total) by mouth every 8 (eight) hours as needed  for anxiety. For anxiety  100 tablet  1  . metFORMIN (GLUCOPHAGE) 500 MG tablet Take 500 mg by mouth daily with breakfast.      . nitroGLYCERIN (NITROSTAT) 0.4 MG SL tablet Place 0.4 mg under the tongue every 5 (five) minutes as needed for chest pain. x3 doses as needed for chest pain      . traMADol (ULTRAM) 50 MG tablet Take 1 tablet (50 mg total) by mouth every 6 (six) hours as needed.  30 tablet  0   No current facility-administered medications for this visit.    Allergies  Allergen Reactions  . Lyrica [Pregabalin] Swelling    wgt gain  . Sulfonamide Derivatives Rash    Patient Active Problem List   Diagnosis Date Noted  . HYPERCHOLESTEROLEMIA 10/22/2010    Priority: High  . Abdominal pain 09/12/2013  . Cholelithiasis 09/12/2013  . Diabetic neuropathy 07/06/2013  . Diastolic dysfunction 02/11/2013  . SSS (sick sinus syndrome) 02/10/2013  . Hypertension 07/30/2012  . Pacemaker-Medtronic 07/19/2012  . Benign hypertensive heart disease without heart failure 01/28/2012  . DM 10/22/2010  . CAD 10/22/2010  . SICK SINUS SYNDROME 10/22/2010  . DEGENERATIVE JOINT DISEASE 10/22/2010    History  Smoking status  . Never Smoker   Smokeless tobacco  . Not on file  History  Alcohol Use No    Family History  Problem Relation Age of Onset  . Cancer Mother   . Cancer Father     Review of Systems: Constitutional: no fever chills diaphoresis or fatigue or change in weight.  Head and neck: no hearing loss, no epistaxis, no photophobia or visual disturbance. Respiratory: No cough, shortness of breath or wheezing. Cardiovascular: No chest pain peripheral edema, palpitations. Gastrointestinal: No abdominal distention, no abdominal pain, no change in bowel habits hematochezia or melena. Genitourinary: No dysuria, no frequency, no urgency, no nocturia. Musculoskeletal:No arthralgias, no back pain, no gait disturbance or myalgias. Neurological: No dizziness, no headaches, no  numbness, no seizures, no syncope, no weakness, no tremors. Hematologic: No lymphadenopathy, no easy bruising. Psychiatric: No confusion, no hallucinations, no sleep disturbance.    Physical Exam: Filed Vitals:   10/10/13 1534  BP: 160/96  Pulse: 77   the general appearance is that of an elderly woman in no distress.The head and neck exam reveals pupils equal and reactive.  Extraocular movements are full.  There is no scleral icterus.  The mouth and pharynx are normal.  The neck is supple.  The carotids reveal no bruits.  The jugular venous pressure is normal.  The  thyroid is not enlarged.  There is no lymphadenopathy.  The chest is clear to percussion and auscultation.  There are no rales or rhonchi.  Expansion of the chest is symmetrical.  The precordium is quiet.  The first heart sound is normal.  The second heart sound is physiologically split.  There is no murmur gallop rub or click.  There is no abnormal lift or heave.  The abdomen is soft and nontender.  The bowel sounds are normal.  The liver and spleen are not enlarged.  There are no abdominal masses.  The laparoscopic surgery incisions are healing well.  There are no abdominal bruits.  Extremities reveal good pedal pulses.  There is no phlebitis or edema.  There is no cyanosis or clubbing.  Strength is normal and symmetrical in all extremities.  There is no lateralizing weakness.  There are no sensory deficits.  The skin is warm and dry.  There is no rash.  EKG today shows atrial pacing and nonspecific T-wave flattening   Assessment / Plan: For her blood pressure we will increase her lisinopril up to 20 mg twice a day. Await results of liver function studies. Recheck in one month for office visit and followup basal metabolic panel

## 2013-10-10 NOTE — Assessment & Plan Note (Signed)
The patient has not been aware of any racing of her heart.  EKG today shows paced atrial rhythm.

## 2013-10-10 NOTE — Assessment & Plan Note (Signed)
The patient is not having any symptoms of CHF 

## 2013-10-10 NOTE — Assessment & Plan Note (Signed)
Recently she had elevated liver function studies because of her cholecystitis.  She is presently off her statin.  We are checking liver functions today and if normal we will resume statin therapy.

## 2013-10-11 ENCOUNTER — Encounter (INDEPENDENT_AMBULATORY_CARE_PROVIDER_SITE_OTHER): Payer: Self-pay

## 2013-10-11 ENCOUNTER — Ambulatory Visit (INDEPENDENT_AMBULATORY_CARE_PROVIDER_SITE_OTHER): Payer: Medicare Other | Admitting: General Surgery

## 2013-10-11 VITALS — BP 142/90 | HR 84 | Temp 98.1°F | Resp 16 | Ht 64.0 in | Wt 154.6 lb

## 2013-10-11 DIAGNOSIS — K811 Chronic cholecystitis: Secondary | ICD-10-CM | POA: Insufficient documentation

## 2013-10-11 LAB — CBC WITH DIFFERENTIAL/PLATELET
Basophils Absolute: 0 10*3/uL (ref 0.0–0.1)
Basophils Relative: 0.4 % (ref 0.0–3.0)
Eosinophils Absolute: 0.3 10*3/uL (ref 0.0–0.7)
HCT: 38.1 % (ref 36.0–46.0)
Lymphocytes Relative: 31.6 % (ref 12.0–46.0)
MCHC: 32.8 g/dL (ref 30.0–36.0)
MCV: 87.1 fl (ref 78.0–100.0)
Monocytes Absolute: 0.7 10*3/uL (ref 0.1–1.0)
Neutrophils Relative %: 57.1 % (ref 43.0–77.0)
RBC: 4.37 Mil/uL (ref 3.87–5.11)
RDW: 15.2 % — ABNORMAL HIGH (ref 11.5–14.6)

## 2013-10-11 LAB — BASIC METABOLIC PANEL
CO2: 27 mEq/L (ref 19–32)
Calcium: 9.7 mg/dL (ref 8.4–10.5)
GFR: 71.5 mL/min (ref >60.00)
Glucose, Bld: 90 mg/dL (ref 70–99)
Potassium: 4 mEq/L (ref 3.5–5.1)
Sodium: 137 mEq/L (ref 135–145)

## 2013-10-11 LAB — LIPID PANEL
Cholesterol: 215 mg/dL — ABNORMAL HIGH (ref 0–200)
HDL: 49 mg/dL (ref >39.00)
Total CHOL/HDL Ratio: 4
Triglycerides: 77 mg/dL (ref 0.0–149.0)

## 2013-10-11 LAB — HEPATIC FUNCTION PANEL
AST: 21 U/L (ref 0–37)
Alkaline Phosphatase: 99 U/L (ref 39–117)
Total Bilirubin: 0.8 mg/dL (ref 0.3–1.2)
Total Protein: 7.1 g/dL (ref 6.0–8.3)

## 2013-10-11 NOTE — Patient Instructions (Signed)
You may resume to normal activities.  Be sure to follow up with Dr. Amie Critchley to address the lesion on your spleen.  Follow up in our clinic as needed.

## 2013-10-11 NOTE — Progress Notes (Signed)
Quick Note:  Please report to patient. The recent labs are stable. Continue same medication and careful diet. Hemoglobin is back up to normal at 12.5. White count is now normal at 8.4. ______

## 2013-10-11 NOTE — Progress Notes (Signed)
Quick Note:  Please report to patient. The recent labs are stable. Continue same medication and careful diet. The liver tests have returned to normal. The LDL is too high 152. Start back on crestor 10 mg daily. ______

## 2013-10-11 NOTE — Progress Notes (Signed)
Jessica [name] Mar 22, 1927 [id number] 10/11/2013   Jessica [name] is a 79 y.o. female who had a laparoscopic cholecystectomy with intraoperative cholangiogram by Dr. Larwance Rote.  The pathology report confirmed chronic cholecystitis.  The patient reports that they are feeling well with normal bowel movements and good appetite.  The pre-operative symptoms of abdominal pain, nausea, and vomiting have resolved.  She has followed up with her pcp.  Physical examination - Incisions appear well-healed with no sign of infection or bleeding.   Abdomen - soft, non-tender  Filed Vitals:   10/11/13 1430  BP: 142/90  Pulse: 84  Temp: 98.1 F (36.7 C)  Resp: 16     Impression:  s/p laparoscopic cholecystectomy  Plan:  She may resume a regular diet and full activity.  She may follow-up on a PRN basis.  I will send a message to PCP regarding follow up for splenic lesions.

## 2013-10-12 DIAGNOSIS — E1149 Type 2 diabetes mellitus with other diabetic neurological complication: Secondary | ICD-10-CM | POA: Diagnosis not present

## 2013-10-12 DIAGNOSIS — L89109 Pressure ulcer of unspecified part of back, unspecified stage: Secondary | ICD-10-CM | POA: Diagnosis not present

## 2013-10-12 DIAGNOSIS — L8991 Pressure ulcer of unspecified site, stage 1: Secondary | ICD-10-CM | POA: Diagnosis not present

## 2013-10-12 DIAGNOSIS — L89309 Pressure ulcer of unspecified buttock, unspecified stage: Secondary | ICD-10-CM | POA: Diagnosis not present

## 2013-10-12 DIAGNOSIS — G909 Disorder of the autonomic nervous system, unspecified: Secondary | ICD-10-CM | POA: Diagnosis not present

## 2013-10-13 DIAGNOSIS — L89309 Pressure ulcer of unspecified buttock, unspecified stage: Secondary | ICD-10-CM | POA: Diagnosis not present

## 2013-10-13 DIAGNOSIS — G909 Disorder of the autonomic nervous system, unspecified: Secondary | ICD-10-CM | POA: Diagnosis not present

## 2013-10-13 DIAGNOSIS — L89109 Pressure ulcer of unspecified part of back, unspecified stage: Secondary | ICD-10-CM | POA: Diagnosis not present

## 2013-10-13 DIAGNOSIS — E1149 Type 2 diabetes mellitus with other diabetic neurological complication: Secondary | ICD-10-CM | POA: Diagnosis not present

## 2013-10-13 DIAGNOSIS — L8991 Pressure ulcer of unspecified site, stage 1: Secondary | ICD-10-CM | POA: Diagnosis not present

## 2013-10-17 DIAGNOSIS — E1149 Type 2 diabetes mellitus with other diabetic neurological complication: Secondary | ICD-10-CM | POA: Diagnosis not present

## 2013-10-17 DIAGNOSIS — L8991 Pressure ulcer of unspecified site, stage 1: Secondary | ICD-10-CM | POA: Diagnosis not present

## 2013-10-17 DIAGNOSIS — L89109 Pressure ulcer of unspecified part of back, unspecified stage: Secondary | ICD-10-CM | POA: Diagnosis not present

## 2013-10-17 DIAGNOSIS — G909 Disorder of the autonomic nervous system, unspecified: Secondary | ICD-10-CM | POA: Diagnosis not present

## 2013-10-17 DIAGNOSIS — L89309 Pressure ulcer of unspecified buttock, unspecified stage: Secondary | ICD-10-CM | POA: Diagnosis not present

## 2013-10-19 ENCOUNTER — Telehealth: Payer: Self-pay | Admitting: Cardiology

## 2013-10-19 DIAGNOSIS — L89309 Pressure ulcer of unspecified buttock, unspecified stage: Secondary | ICD-10-CM | POA: Diagnosis not present

## 2013-10-19 DIAGNOSIS — L8991 Pressure ulcer of unspecified site, stage 1: Secondary | ICD-10-CM | POA: Diagnosis not present

## 2013-10-19 DIAGNOSIS — G909 Disorder of the autonomic nervous system, unspecified: Secondary | ICD-10-CM | POA: Diagnosis not present

## 2013-10-19 DIAGNOSIS — L89109 Pressure ulcer of unspecified part of back, unspecified stage: Secondary | ICD-10-CM | POA: Diagnosis not present

## 2013-10-19 DIAGNOSIS — E1149 Type 2 diabetes mellitus with other diabetic neurological complication: Secondary | ICD-10-CM | POA: Diagnosis not present

## 2013-10-19 MED ORDER — AMLODIPINE BESYLATE 5 MG PO TABS: 5.0000 mg | ORAL_TABLET | Freq: Every day | ORAL | Status: DC

## 2013-10-19 NOTE — Telephone Encounter (Signed)
Advised daughter and left message with home health nurse

## 2013-10-19 NOTE — Telephone Encounter (Signed)
New problem   RN Gordan w/[facility] called to report pt's BP 182/92 running elevatied for the pass week.   Please give her a call back.

## 2013-10-19 NOTE — Telephone Encounter (Signed)
Spoke with Schuyler Amor RN with [facility]. Patient blood pressure today 182/92 and has been running this high consistently over the last week. Patient Lisinopril increase to 20 mg twice a day on 10/10/13. Will forward to  Dr. Patty Sermons for review

## 2013-10-19 NOTE — Telephone Encounter (Signed)
Add amlodipine 5 mg one daily

## 2013-10-24 DIAGNOSIS — L89109 Pressure ulcer of unspecified part of back, unspecified stage: Secondary | ICD-10-CM | POA: Diagnosis not present

## 2013-10-24 DIAGNOSIS — L89309 Pressure ulcer of unspecified buttock, unspecified stage: Secondary | ICD-10-CM | POA: Diagnosis not present

## 2013-10-24 DIAGNOSIS — L8991 Pressure ulcer of unspecified site, stage 1: Secondary | ICD-10-CM | POA: Diagnosis not present

## 2013-10-24 DIAGNOSIS — G909 Disorder of the autonomic nervous system, unspecified: Secondary | ICD-10-CM | POA: Diagnosis not present

## 2013-10-24 DIAGNOSIS — E1149 Type 2 diabetes mellitus with other diabetic neurological complication: Secondary | ICD-10-CM | POA: Diagnosis not present

## 2013-10-26 DIAGNOSIS — E1149 Type 2 diabetes mellitus with other diabetic neurological complication: Secondary | ICD-10-CM | POA: Diagnosis not present

## 2013-10-26 DIAGNOSIS — L89309 Pressure ulcer of unspecified buttock, unspecified stage: Secondary | ICD-10-CM | POA: Diagnosis not present

## 2013-10-26 DIAGNOSIS — L8991 Pressure ulcer of unspecified site, stage 1: Secondary | ICD-10-CM | POA: Diagnosis not present

## 2013-10-26 DIAGNOSIS — G909 Disorder of the autonomic nervous system, unspecified: Secondary | ICD-10-CM | POA: Diagnosis not present

## 2013-10-26 DIAGNOSIS — L89109 Pressure ulcer of unspecified part of back, unspecified stage: Secondary | ICD-10-CM | POA: Diagnosis not present

## 2013-11-02 DIAGNOSIS — L8991 Pressure ulcer of unspecified site, stage 1: Secondary | ICD-10-CM | POA: Diagnosis not present

## 2013-11-02 DIAGNOSIS — L89309 Pressure ulcer of unspecified buttock, unspecified stage: Secondary | ICD-10-CM | POA: Diagnosis not present

## 2013-11-02 DIAGNOSIS — L89109 Pressure ulcer of unspecified part of back, unspecified stage: Secondary | ICD-10-CM | POA: Diagnosis not present

## 2013-11-02 DIAGNOSIS — G909 Disorder of the autonomic nervous system, unspecified: Secondary | ICD-10-CM | POA: Diagnosis not present

## 2013-11-02 DIAGNOSIS — E1149 Type 2 diabetes mellitus with other diabetic neurological complication: Secondary | ICD-10-CM | POA: Diagnosis not present

## 2013-11-09 DIAGNOSIS — L89109 Pressure ulcer of unspecified part of back, unspecified stage: Secondary | ICD-10-CM | POA: Diagnosis not present

## 2013-11-09 DIAGNOSIS — L89309 Pressure ulcer of unspecified buttock, unspecified stage: Secondary | ICD-10-CM | POA: Diagnosis not present

## 2013-11-09 DIAGNOSIS — L8991 Pressure ulcer of unspecified site, stage 1: Secondary | ICD-10-CM | POA: Diagnosis not present

## 2013-11-09 DIAGNOSIS — Z48815 Encounter for surgical aftercare following surgery on the digestive system: Secondary | ICD-10-CM | POA: Diagnosis not present

## 2013-11-09 DIAGNOSIS — G909 Disorder of the autonomic nervous system, unspecified: Secondary | ICD-10-CM | POA: Diagnosis not present

## 2013-11-09 DIAGNOSIS — E1149 Type 2 diabetes mellitus with other diabetic neurological complication: Secondary | ICD-10-CM | POA: Diagnosis not present

## 2013-11-15 ENCOUNTER — Encounter: Payer: Self-pay | Admitting: Cardiology

## 2013-11-15 ENCOUNTER — Ambulatory Visit (INDEPENDENT_AMBULATORY_CARE_PROVIDER_SITE_OTHER): Payer: Medicare Other | Admitting: Cardiology

## 2013-11-15 VITALS — BP 166/82 | HR 80 | Ht 64.0 in | Wt 150.0 lb

## 2013-11-15 DIAGNOSIS — I10 Essential (primary) hypertension: Secondary | ICD-10-CM | POA: Diagnosis not present

## 2013-11-15 DIAGNOSIS — R609 Edema, unspecified: Secondary | ICD-10-CM

## 2013-11-15 DIAGNOSIS — I119 Hypertensive heart disease without heart failure: Secondary | ICD-10-CM

## 2013-11-15 DIAGNOSIS — I495 Sick sinus syndrome: Secondary | ICD-10-CM

## 2013-11-15 DIAGNOSIS — I2510 Atherosclerotic heart disease of native coronary artery without angina pectoris: Secondary | ICD-10-CM

## 2013-11-15 DIAGNOSIS — R600 Localized edema: Secondary | ICD-10-CM

## 2013-11-15 MED ORDER — CARVEDILOL 25 MG PO TABS: 25.0000 mg | ORAL_TABLET | Freq: Two times a day (BID) | ORAL | Status: DC

## 2013-11-15 MED ORDER — HYDROCHLOROTHIAZIDE 12.5 MG PO CAPS: 12.5000 mg | ORAL_CAPSULE | Freq: Every day | ORAL | Status: DC

## 2013-11-15 NOTE — Patient Instructions (Addendum)
STOP AMLODIPINE  START HCTZ 12.5 MG DAILY  INCREASE CARVEDILOL TO 25 MG TWICE A DAY  Call Yasaman Kolek next Wednesday with update ([phone])  Your physician recommends that you schedule a follow-up appointment in: 2 month ov/bmet

## 2013-11-15 NOTE — Assessment & Plan Note (Signed)
The patient has had no recurrent chest pain or angina pectoris. 

## 2013-11-15 NOTE — Progress Notes (Signed)
Jessica [name] Date of Birth:  Sep 15, 1927 [address] [place], [place]  [address] [phone]         Fax   [phone]  History of Present Illness: This pleasant 80 year old woman is seen for a  office visit. She has a complex past medical history. She has a history of sick sinus syndrome and has a permanent pacemaker. She has had no further episodes of syncope since the pacemaker implantation. She has a history of ischemic heart disease and underwent insertion of 2 drug-eluting stents placed in the mid and distal right coronary artery in September 2000. She has a history of diabetes, essential hypertension, and high cholesterol. An echocardiogram in September 2011 showed normal ejection fraction of 37-16% with diastolic dysfunction.  She was recently hospitalized with acute cholecystitis.  She underwent surgery by Dr. Brantley Stage.  She has had a good recovery.  She is getting her strength back.  Her appetite is improving.. she is still having problems with elevated blood pressure and with swelling of her feet.   Current Outpatient Prescriptions  Medication Sig Dispense Refill  . aspirin 81 MG tablet Take 81 mg by mouth every morning.       . Calcium Carbonate-Vitamin D (CALCIUM + D PO) Take 1 tablet by mouth every morning.       . carvedilol (COREG) 25 MG tablet Take 1 tablet (25 mg total) by mouth 2 (two) times daily.  180 tablet  3  . Cyanocobalamin (VITAMIN B12 PO) Take 1 tablet by mouth daily.       . fish oil-omega-3 fatty acids 1000 MG capsule Take 1 g by mouth every morning.       . gabapentin (NEURONTIN) 300 MG capsule Take 1 capsule (300 mg total) by mouth daily.  30 capsule  prn  . lisinopril (PRINIVIL,ZESTRIL) 20 MG tablet Take 1 tablet (20 mg total) by mouth 2 (two) times daily.  60 tablet  5  . LORazepam (ATIVAN) 1 MG tablet Take 0.5 tablets (0.5 mg total) by mouth every 8 (eight) hours as needed for anxiety. For anxiety  100 tablet  1  . metFORMIN (GLUCOPHAGE)  500 MG tablet Take 500 mg by mouth daily with breakfast.      . nitroGLYCERIN (NITROSTAT) 0.4 MG SL tablet Place 0.4 mg under the tongue every 5 (five) minutes as needed for chest pain. x3 doses as needed for chest pain      . traMADol (ULTRAM) 50 MG tablet Take 1 tablet (50 mg total) by mouth every 6 (six) hours as needed.  30 tablet  0  . hydrochlorothiazide (MICROZIDE) 12.5 MG capsule Take 1 capsule (12.5 mg total) by mouth daily.  90 capsule  3   No current facility-administered medications for this visit.    Allergies  Allergen Reactions  . Amlodipine     Swelling   . Lyrica [Pregabalin] Swelling    wgt gain  . Sulfonamide Derivatives Rash    Patient Active Problem List   Diagnosis Date Noted  . HYPERCHOLESTEROLEMIA 10/22/2010    Priority: High  . Chronic cholecystitis 10/11/2013  . Abdominal pain 09/12/2013  . Cholelithiasis 09/12/2013  . Diabetic neuropathy 07/06/2013  . Diastolic dysfunction 96/78/9381  . SSS (sick sinus syndrome) 02/10/2013  . Hypertension 07/30/2012  . Pacemaker-Medtronic 07/19/2012  . Benign hypertensive heart disease without heart failure 01/28/2012  . DM 10/22/2010  . CAD 10/22/2010  . SICK SINUS SYNDROME 10/22/2010  . DEGENERATIVE JOINT DISEASE 10/22/2010  History  Smoking status  . Never Smoker   Smokeless tobacco  . Not on file    History  Alcohol Use No    Family History  Problem Relation Age of Onset  . Cancer Mother   . Cancer Father     Review of Systems: Constitutional: no fever chills diaphoresis or fatigue or change in weight.  Head and neck: no hearing loss, no epistaxis, no photophobia or visual disturbance. Respiratory: No cough, shortness of breath or wheezing. Cardiovascular: No chest pain peripheral edema, palpitations. Gastrointestinal: No abdominal distention, no abdominal pain, no change in bowel habits hematochezia or melena. Genitourinary: No dysuria, no frequency, no urgency, no  nocturia. Musculoskeletal:No arthralgias, no back pain, no gait disturbance or myalgias. Neurological: No dizziness, no headaches, no numbness, no seizures, no syncope, no weakness, no tremors. Hematologic: No lymphadenopathy, no easy bruising. Psychiatric: No confusion, no hallucinations, no sleep disturbance.    Physical Exam: Filed Vitals:   11/15/13 1531  BP: 166/82  Pulse: 80   the general appearance is that of an elderly woman in no distress.The head and neck exam reveals pupils equal and reactive.  Extraocular movements are full.  There is no scleral icterus.  The mouth and pharynx are normal.  The neck is supple.  The carotids reveal no bruits.  The jugular venous pressure is normal.  The  thyroid is not enlarged.  There is no lymphadenopathy.  The chest is clear to percussion and auscultation.  There are no rales or rhonchi.  Expansion of the chest is symmetrical.  The precordium is quiet.  The first heart sound is normal.  The second heart sound is physiologically split.  There is no murmur gallop rub or click.  There is no abnormal lift or heave.  The abdomen is soft and nontender.  The bowel sounds are normal.  The liver and spleen are not enlarged.  There are no abdominal masses.  The laparoscopic surgery incisions are healing well.  There are no abdominal bruits.  Extremities reveal good pedal pulses.  There is no phlebitis or edema.  There is no cyanosis or clubbing.  Strength is normal and symmetrical in all extremities.  There is no lateralizing weakness.  There are no sensory deficits.  The skin is warm and dry.  There is no rash.    Assessment / Plan: Stop amlodipine because of peripheral edema.Marland Kitchen and HCTZ and increase carvedilol.  If blood pressure remains up we will consider adding low-dose hydralazine.  She will call us with a report on her blood pressures in about a week

## 2013-11-15 NOTE — Assessment & Plan Note (Signed)
The patient has not been having a spells of tachycardia.  No dizziness or syncope.

## 2013-11-15 NOTE — Assessment & Plan Note (Signed)
Her blood pressure remains high.  She is not on any diuretic.  She is on amlodipine which may be contributing to her pedal edema.  We will stop amlodipine and we will add HCTZ 12.5 mg one daily.  We will also increase her carvedilol up to 25 mg twice a day

## 2013-11-16 DIAGNOSIS — Z48815 Encounter for surgical aftercare following surgery on the digestive system: Secondary | ICD-10-CM | POA: Diagnosis not present

## 2013-11-16 DIAGNOSIS — G909 Disorder of the autonomic nervous system, unspecified: Secondary | ICD-10-CM | POA: Diagnosis not present

## 2013-11-16 DIAGNOSIS — E1149 Type 2 diabetes mellitus with other diabetic neurological complication: Secondary | ICD-10-CM | POA: Diagnosis not present

## 2013-11-16 DIAGNOSIS — L8991 Pressure ulcer of unspecified site, stage 1: Secondary | ICD-10-CM | POA: Diagnosis not present

## 2013-11-16 DIAGNOSIS — L89109 Pressure ulcer of unspecified part of back, unspecified stage: Secondary | ICD-10-CM | POA: Diagnosis not present

## 2013-11-16 DIAGNOSIS — L89309 Pressure ulcer of unspecified buttock, unspecified stage: Secondary | ICD-10-CM | POA: Diagnosis not present

## 2013-11-23 ENCOUNTER — Telehealth: Payer: Self-pay | Admitting: Cardiology

## 2013-11-23 NOTE — Telephone Encounter (Signed)
New message     bp this am 168/79 pulse 61; took medication and it was 159/69 pulse 66

## 2013-11-23 NOTE — Telephone Encounter (Signed)
Start hydralazine 10 mg 3 times a day

## 2013-11-23 NOTE — Telephone Encounter (Signed)
Blood pressure has still been high over the last week  1/16 am 150/74   1/17 am 139/74  1/18 am 144/62   1/19 am 135/61  1/20 am 145/70  1/21 am 168/79 before medications and 1 1/2 later down to 159/69.   Will forward to  Dr. [name] for review

## 2013-11-23 NOTE — Telephone Encounter (Signed)
Left message to call back  

## 2013-11-24 MED ORDER — HYDRALAZINE HCL 10 MG PO TABS: 10.0000 mg | ORAL_TABLET | Freq: Three times a day (TID) | ORAL | Status: DC

## 2013-11-24 NOTE — Telephone Encounter (Signed)
agree

## 2013-11-24 NOTE — Telephone Encounter (Signed)
Spoke with patients daughter and blood pressure was down to 126/74, P 80 this am. Daughter would like to continue to monitor for another week since it is good this am. Did send Rx to pharmacy so they would have on hand if blood pressure up over the weekend. Daughter will call back next week with an update.

## 2013-12-08 ENCOUNTER — Other Ambulatory Visit: Payer: Self-pay | Admitting: Cardiology

## 2013-12-08 DIAGNOSIS — F419 Anxiety disorder, unspecified: Secondary | ICD-10-CM

## 2013-12-09 NOTE — Telephone Encounter (Signed)
Blood pressure Sunday 114/64, Monday 164/80, Tuesday 126/71 wednesday 148/73, Thursday 125/77 today  129/67, patient did not get Hydralazine and start. Will continue to monitor   Was recommended from labs in December to restart, patient has been taking.

## 2013-12-09 NOTE — Telephone Encounter (Signed)
New Message  Pt daughter called states that there was no mention that the pt takes Crestor// pt daughter is wondering if she still needs to take it or discontinue. Please call back to discuss.

## 2014-01-13 ENCOUNTER — Ambulatory Visit (INDEPENDENT_AMBULATORY_CARE_PROVIDER_SITE_OTHER): Payer: Medicare Other | Admitting: Cardiology

## 2014-01-13 ENCOUNTER — Encounter: Payer: Self-pay | Admitting: Cardiology

## 2014-01-13 VITALS — BP 140/84 | HR 85 | Ht 64.0 in | Wt 145.0 lb

## 2014-01-13 DIAGNOSIS — I119 Hypertensive heart disease without heart failure: Secondary | ICD-10-CM | POA: Diagnosis not present

## 2014-01-13 DIAGNOSIS — E119 Type 2 diabetes mellitus without complications: Secondary | ICD-10-CM

## 2014-01-13 DIAGNOSIS — I2510 Atherosclerotic heart disease of native coronary artery without angina pectoris: Secondary | ICD-10-CM

## 2014-01-13 DIAGNOSIS — I495 Sick sinus syndrome: Secondary | ICD-10-CM

## 2014-01-13 DIAGNOSIS — IMO0001 Reserved for inherently not codable concepts without codable children: Secondary | ICD-10-CM

## 2014-01-13 DIAGNOSIS — I10 Essential (primary) hypertension: Secondary | ICD-10-CM

## 2014-01-13 DIAGNOSIS — E1165 Type 2 diabetes mellitus with hyperglycemia: Secondary | ICD-10-CM

## 2014-01-13 LAB — BASIC METABOLIC PANEL
BUN: 25 mg/dL — ABNORMAL HIGH (ref 6–23)
CO2: 30 mEq/L (ref 19–32)
CREATININE: 1.1 mg/dL (ref 0.4–1.2)
Calcium: 10.3 mg/dL (ref 8.4–10.5)
Chloride: 104 mEq/L (ref 96–112)
GFR: 48.17 mL/min — ABNORMAL LOW (ref >60.00)
Glucose, Bld: 94 mg/dL (ref 70–99)
Potassium: 3.9 mEq/L (ref 3.5–5.1)
Sodium: 141 mEq/L (ref 135–145)

## 2014-01-13 NOTE — Assessment & Plan Note (Signed)
Blood pressure is still mildly elevated.  She has not been taking the hydralazine 10 mg 3 times a day that we have listed for her.  She will go ahead and start that now.  Her edema cleared up when we stopped her amlodipine and switched her to hydrochlorothiazide and increased her ACE inhibitor.  We are checking a basal metabolic panel today for renal function

## 2014-01-13 NOTE — Assessment & Plan Note (Signed)
The patient is not having any hypoglycemic episodes 

## 2014-01-13 NOTE — Assessment & Plan Note (Signed)
The patient took dual and platelet therapy for a year after her stents and now is on aspirin alone.  She has not had any recurrent chest pain or angina and she states she has never taken nitroglycerin

## 2014-01-13 NOTE — Patient Instructions (Signed)
Will obtain labs today and call you with the results (bmet)  Your physician recommends that you continue on your current medications as directed. Please refer to the Current Medication list given to you today.  Your physician recommends that you schedule a follow-up appointment in: 3 months with fasting labs (LP/BMET/HFP/A1C) and ekg

## 2014-01-13 NOTE — Progress Notes (Signed)
Jessica [name] Date of Birth:  [birth year] [address] [place], [place]  [address] [phone]         Fax   [phone]  History of Present Illness: This pleasant 80 year old woman is seen for a  office visit. She has a complex past medical history. She has a history of sick sinus syndrome and has a permanent pacemaker implanted on 07/14/10.   She has had no further episodes of syncope since the pacemaker implantation. She has a history of ischemic heart disease and underwent insertion of 2 drug-eluting stents placed in the mid and distal right coronary artery in September 2010 by Dr. Terrence Dupont. She has a history of diabetes, essential hypertension, and high cholesterol. An echocardiogram in September 2011 showed normal ejection fraction of 37-10% with diastolic dysfunction.  She was recently hospitalized with acute cholecystitis.  She underwent surgery by Dr. Brantley Stage.  She has had a good recovery.  She is getting her strength back.  Her appetite is improving.  She has lost 5 pounds because of diuresis since last visit.   Current Outpatient Prescriptions  Medication Sig Dispense Refill  . aspirin 81 MG tablet Take 81 mg by mouth every morning.       . Calcium Carbonate-Vitamin D (CALCIUM + D PO) Take 1 tablet by mouth every morning.       . carvedilol (COREG) 25 MG tablet Take 1 tablet (25 mg total) by mouth 2 (two) times daily.  180 tablet  3  . Cyanocobalamin (VITAMIN B12 PO) Take 1 tablet by mouth daily.       . fish oil-omega-3 fatty acids 1000 MG capsule Take 1 g by mouth every morning.       . gabapentin (NEURONTIN) 300 MG capsule Take 1 capsule (300 mg total) by mouth daily.  30 capsule  prn  . hydrALAZINE (APRESOLINE) 10 MG tablet Take 1 tablet (10 mg total) by mouth 3 (three) times daily.  90 tablet  3  . hydrochlorothiazide (MICROZIDE) 12.5 MG capsule Take 1 capsule (12.5 mg total) by mouth daily.  90 capsule  3  . lisinopril (PRINIVIL,ZESTRIL) 20 MG tablet Take 1  tablet (20 mg total) by mouth 2 (two) times daily.  60 tablet  5  . LORazepam (ATIVAN) 1 MG tablet TAKE ONE TABLET BY MOUTH THREE TIMES DAILY AS NEEDED  100 tablet  0  . metFORMIN (GLUCOPHAGE) 500 MG tablet Take 500 mg by mouth daily with breakfast.      . nitroGLYCERIN (NITROSTAT) 0.4 MG SL tablet Place 0.4 mg under the tongue every 5 (five) minutes as needed for chest pain. x3 doses as needed for chest pain      . rosuvastatin (CRESTOR) 10 MG tablet Take 10 mg by mouth daily.      . traMADol (ULTRAM) 50 MG tablet Take 1 tablet (50 mg total) by mouth every 6 (six) hours as needed.  30 tablet  0   No current facility-administered medications for this visit.    Allergies  Allergen Reactions  . Amlodipine     Swelling   . Lyrica [Pregabalin] Swelling    wgt gain  . Sulfonamide Derivatives Rash    Patient Active Problem List   Diagnosis Date Noted  . HYPERCHOLESTEROLEMIA 10/22/2010    Priority: High  . Chronic cholecystitis 10/11/2013  . Abdominal pain 09/12/2013  . Cholelithiasis 09/12/2013  . Diabetic neuropathy 07/06/2013  . Diastolic dysfunction 62/69/4854  . SSS (sick sinus syndrome) 02/10/2013  .  Hypertension 07/30/2012  . Pacemaker-Medtronic 07/19/2012  . Benign hypertensive heart disease without heart failure 01/28/2012  . DM 10/22/2010  . CAD 10/22/2010  . SICK SINUS SYNDROME 10/22/2010  . DEGENERATIVE JOINT DISEASE 10/22/2010    History  Smoking status  . Never Smoker   Smokeless tobacco  . Not on file    History  Alcohol Use No    Family History  Problem Relation Age of Onset  . Cancer Mother   . Cancer Father     Review of Systems: Constitutional: no fever chills diaphoresis or fatigue or change in weight.  Head and neck: no hearing loss, no epistaxis, no photophobia or visual disturbance. Respiratory: No cough, shortness of breath or wheezing. Cardiovascular: No chest pain peripheral edema, palpitations. Gastrointestinal: No abdominal distention,  no abdominal pain, no change in bowel habits hematochezia or melena. Genitourinary: No dysuria, no frequency, no urgency, no nocturia. Musculoskeletal:No arthralgias, no back pain, no gait disturbance or myalgias. Neurological: No dizziness, no headaches, no numbness, no seizures, no syncope, no weakness, no tremors. Hematologic: No lymphadenopathy, no easy bruising. Psychiatric: No confusion, no hallucinations, no sleep disturbance.    Physical Exam: Filed Vitals:   01/13/14 1212  BP: 140/84  Pulse:    the general appearance is that of an elderly woman in no distress.The head and neck exam reveals pupils equal and reactive.  Extraocular movements are full.  There is no scleral icterus.  The mouth and pharynx are normal.  The neck is supple.  The carotids reveal no bruits.  The jugular venous pressure is normal.  The  thyroid is not enlarged.  There is no lymphadenopathy.  The chest is clear to percussion and auscultation.  There are no rales or rhonchi.  Expansion of the chest is symmetrical.  The precordium is quiet.  The first heart sound is normal.  The second heart sound is physiologically split.  There is no murmur gallop rub or click.  There is no abnormal lift or heave.  The abdomen is soft and nontender.  The bowel sounds are normal.  The liver and spleen are not enlarged.  There are no abdominal masses.  The laparoscopic surgery incisions are healing well.  There are no abdominal bruits.  Extremities reveal good pedal pulses.  There is no phlebitis or edema.  There is no cyanosis or clubbing.  Strength is normal and symmetrical in all extremities.  There is no lateralizing weakness.  There are no sensory deficits.  The skin is warm and dry.  There is no rash.    Assessment / Plan: Continue on current medication.  Start hydralazine 10 mg 3 times a day.  If her blood pressure comes down significantly she can cut back on the hydralazine.  She still tires easily we filled out a handicap  sticker application for automobile. Recheck in 3 months for office visit EKG fasting lipid panel hepatic function panel basal metabolic panel and K7Q

## 2014-01-14 NOTE — Progress Notes (Signed)
Quick Note:  Please report to patient. The recent labs are stable. Continue same medication and careful diet. ______ 

## 2014-01-17 ENCOUNTER — Telehealth: Payer: Self-pay | Admitting: *Deleted

## 2014-01-17 NOTE — Telephone Encounter (Signed)
Message copied by Earvin Hansen on Tue Jan 17, 2014  9:16 AM ------      Message from: Darlin Coco      Created: Sat Jan 14, 2014  3:24 PM       Please report to patient.  The recent labs are stable. Continue same medication and careful diet. ------

## 2014-01-17 NOTE — Telephone Encounter (Signed)
Advised patient of lab results  

## 2014-01-19 ENCOUNTER — Encounter: Payer: Self-pay | Admitting: *Deleted

## 2014-02-02 ENCOUNTER — Other Ambulatory Visit: Payer: Self-pay | Admitting: Cardiology

## 2014-02-08 ENCOUNTER — Ambulatory Visit (INDEPENDENT_AMBULATORY_CARE_PROVIDER_SITE_OTHER): Payer: Medicare Other | Admitting: *Deleted

## 2014-02-08 ENCOUNTER — Encounter: Payer: Self-pay | Admitting: Internal Medicine

## 2014-02-08 DIAGNOSIS — I495 Sick sinus syndrome: Secondary | ICD-10-CM

## 2014-02-08 LAB — MDC_IDC_ENUM_SESS_TYPE_INCLINIC
Battery Impedance: 154 Ohm
Battery Remaining Longevity: 133 mo
Brady Statistic AP VP Percent: 13 %
Date Time Interrogation Session: 20150408160711
Lead Channel Impedance Value: 622 Ohm
Lead Channel Setting Pacing Amplitude: 2 V
Lead Channel Setting Pacing Amplitude: 2.5 V
Lead Channel Setting Sensing Sensitivity: 2 mV
MDC IDC MSMT BATTERY VOLTAGE: 2.79 V
MDC IDC MSMT LEADCHNL RA PACING THRESHOLD AMPLITUDE: 0.75 V
MDC IDC MSMT LEADCHNL RA PACING THRESHOLD PULSEWIDTH: 0.4 ms
MDC IDC MSMT LEADCHNL RV IMPEDANCE VALUE: 673 Ohm
MDC IDC MSMT LEADCHNL RV PACING THRESHOLD AMPLITUDE: 0.5 V
MDC IDC MSMT LEADCHNL RV PACING THRESHOLD PULSEWIDTH: 0.4 ms
MDC IDC MSMT LEADCHNL RV SENSING INTR AMPL: 2.8 mV
MDC IDC SET LEADCHNL RV PACING PULSEWIDTH: 0.4 ms
MDC IDC STAT BRADY AP VS PERCENT: 84 %
MDC IDC STAT BRADY AS VP PERCENT: 0 %
MDC IDC STAT BRADY AS VS PERCENT: 3 %

## 2014-02-08 NOTE — Progress Notes (Signed)
Pacemaker check in clinic. Normal device function.  RA thresholds, sensing, impedances consistent with previous measurements.  R-waves 2.8-4.38mV , impedance and threshold stable. Device programmed to maximize longevity. 7 mode switches all <1 minute.  No high ventricular rates noted. Device programmed at appropriate safety margins. Histogram distribution appropriate for patient activity level. Device programmed to optimize intrinsic conduction. Estimated longevity 11 years. Patient education completed.  We will recheck her R-waves when she comes in June with Dr. [name].  ROV 6 months with Dr. [name].

## 2014-04-07 ENCOUNTER — Encounter (INDEPENDENT_AMBULATORY_CARE_PROVIDER_SITE_OTHER): Payer: Self-pay

## 2014-04-07 ENCOUNTER — Encounter: Payer: Self-pay | Admitting: Cardiology

## 2014-04-07 ENCOUNTER — Ambulatory Visit (INDEPENDENT_AMBULATORY_CARE_PROVIDER_SITE_OTHER): Payer: Medicare Other | Admitting: *Deleted

## 2014-04-07 ENCOUNTER — Other Ambulatory Visit: Payer: Medicare Other

## 2014-04-07 ENCOUNTER — Ambulatory Visit (INDEPENDENT_AMBULATORY_CARE_PROVIDER_SITE_OTHER): Payer: Medicare Other | Admitting: Cardiology

## 2014-04-07 VITALS — BP 140/92 | HR 68 | Ht 63.0 in

## 2014-04-07 DIAGNOSIS — IMO0001 Reserved for inherently not codable concepts without codable children: Secondary | ICD-10-CM

## 2014-04-07 DIAGNOSIS — E1165 Type 2 diabetes mellitus with hyperglycemia: Secondary | ICD-10-CM

## 2014-04-07 DIAGNOSIS — G609 Hereditary and idiopathic neuropathy, unspecified: Secondary | ICD-10-CM | POA: Diagnosis not present

## 2014-04-07 DIAGNOSIS — E1149 Type 2 diabetes mellitus with other diabetic neurological complication: Secondary | ICD-10-CM

## 2014-04-07 DIAGNOSIS — I119 Hypertensive heart disease without heart failure: Secondary | ICD-10-CM

## 2014-04-07 DIAGNOSIS — I2510 Atherosclerotic heart disease of native coronary artery without angina pectoris: Secondary | ICD-10-CM

## 2014-04-07 DIAGNOSIS — G629 Polyneuropathy, unspecified: Secondary | ICD-10-CM

## 2014-04-07 DIAGNOSIS — E1140 Type 2 diabetes mellitus with diabetic neuropathy, unspecified: Secondary | ICD-10-CM

## 2014-04-07 DIAGNOSIS — I495 Sick sinus syndrome: Secondary | ICD-10-CM

## 2014-04-07 DIAGNOSIS — E1142 Type 2 diabetes mellitus with diabetic polyneuropathy: Secondary | ICD-10-CM

## 2014-04-07 LAB — HEPATIC FUNCTION PANEL
ALT: 16 U/L (ref 0–35)
AST: 20 U/L (ref 0–37)
Albumin: 3.7 g/dL (ref 3.5–5.2)
Alkaline Phosphatase: 56 U/L (ref 39–117)
BILIRUBIN TOTAL: 0.7 mg/dL (ref 0.2–1.2)
Bilirubin, Direct: 0.2 mg/dL (ref 0.0–0.3)
TOTAL PROTEIN: 6.2 g/dL (ref 6.0–8.3)

## 2014-04-07 LAB — MDC_IDC_ENUM_SESS_TYPE_INCLINIC
Battery Impedance: 177 Ohm
Battery Remaining Longevity: 124 mo
Battery Voltage: 2.79 V
Brady Statistic AP VP Percent: 29 %
Brady Statistic AP VS Percent: 70 %
Brady Statistic AS VS Percent: 0 %
Date Time Interrogation Session: 20150605100120
Lead Channel Impedance Value: 591 Ohm
Lead Channel Sensing Intrinsic Amplitude: 4 mV
Lead Channel Setting Pacing Amplitude: 2 V
Lead Channel Setting Pacing Amplitude: 2.5 V
Lead Channel Setting Pacing Pulse Width: 0.4 ms
MDC IDC MSMT LEADCHNL RV IMPEDANCE VALUE: 674 Ohm
MDC IDC SET LEADCHNL RV SENSING SENSITIVITY: 2.8 mV
MDC IDC STAT BRADY AS VP PERCENT: 1 %

## 2014-04-07 LAB — LIPID PANEL
Cholesterol: 126 mg/dL (ref 0–200)
HDL: 57 mg/dL (ref >39.00)
LDL Cholesterol: 59 mg/dL (ref 0–99)
NonHDL: 69
TRIGLYCERIDES: 50 mg/dL (ref 0.0–149.0)
Total CHOL/HDL Ratio: 2
VLDL: 10 mg/dL (ref 0.0–40.0)

## 2014-04-07 LAB — BASIC METABOLIC PANEL
BUN: 21 mg/dL (ref 6–23)
CALCIUM: 10.1 mg/dL (ref 8.4–10.5)
CO2: 29 mEq/L (ref 19–32)
CREATININE: 1.1 mg/dL (ref 0.4–1.2)
Chloride: 109 mEq/L (ref 96–112)
GFR: 51.79 mL/min — ABNORMAL LOW (ref >60.00)
Glucose, Bld: 100 mg/dL — ABNORMAL HIGH (ref 70–99)
Potassium: 4 mEq/L (ref 3.5–5.1)
SODIUM: 142 meq/L (ref 135–145)

## 2014-04-07 LAB — HEMOGLOBIN A1C: Hgb A1c MFr Bld: 5.7 % (ref 4.6–6.5)

## 2014-04-07 NOTE — Progress Notes (Signed)
Jessica [name] Date of Birth:  1927-09-08 [facility] [address] [place], [place]  [address] [phone]        Fax   [phone]   History of Present Illness: This pleasant 80 year old woman is seen for a office visit. She has a complex past medical history. She has a history of sick sinus syndrome and has a permanent pacemaker implanted on 07/14/10. She has had no further episodes of syncope since the pacemaker implantation. She has a history of ischemic heart disease and underwent insertion of 2 drug-eluting stents placed in the mid and distal right coronary artery in September 2010 by Dr. Terrence Dupont. She has a history of diabetes, essential hypertension, and high cholesterol. An echocardiogram in September 2011 showed normal ejection fraction of 27-74% with diastolic dysfunction. She was recently hospitalized with acute cholecystitis. She underwent surgery by Dr. Brantley Stage. She has had a good recovery. She is getting her strength back. Her appetite is improving.  Her appetite has returned and she has been gaining weight.   Current Outpatient Prescriptions  Medication Sig Dispense Refill  . calcium carbonate (OS-CAL) 600 MG TABS tablet Take 600 mg by mouth 2 (two) times daily with a meal.      . carvedilol (COREG) 25 MG tablet Take 1 tablet (25 mg total) by mouth 2 (two) times daily.  180 tablet  3  . Cyanocobalamin (VITAMIN B12 PO) Take 1 tablet by mouth daily.       . fish oil-omega-3 fatty acids 1000 MG capsule Take 1 g by mouth every morning.       . gabapentin (NEURONTIN) 300 MG capsule Take 1 capsule (300 mg total) by mouth daily.  30 capsule  prn  . hydrALAZINE (APRESOLINE) 10 MG tablet Take 1 tablet (10 mg total) by mouth 3 (three) times daily.  90 tablet  3  . hydrochlorothiazide (MICROZIDE) 12.5 MG capsule Take 1 capsule (12.5 mg total) by mouth daily.  90 capsule  3  . lisinopril (PRINIVIL,ZESTRIL) 20 MG tablet Take 1 tablet (20 mg total) by mouth 2 (two)  times daily.  60 tablet  5  . LORazepam (ATIVAN) 1 MG tablet TAKE ONE TABLET BY MOUTH THREE TIMES DAILY AS NEEDED  100 tablet  0  . metFORMIN (GLUCOPHAGE) 500 MG tablet Take 500 mg by mouth daily with breakfast.      . Multiple Vitamins-Minerals (ECHINACEA ACZ PO) Take by mouth.      . nitroGLYCERIN (NITROSTAT) 0.4 MG SL tablet Place 0.4 mg under the tongue every 5 (five) minutes as needed for chest pain. x3 doses as needed for chest pain      . pyridOXINE (VITAMIN B-6) 100 MG tablet Take 100 mg by mouth daily.      . rosuvastatin (CRESTOR) 10 MG tablet Take 10 mg by mouth daily.      Marland Kitchen aspirin 81 MG tablet Take 81 mg by mouth every morning.       . senna (SENOKOT) 8.6 MG tablet Take 2 tablets by mouth daily.        No current facility-administered medications for this visit.    Allergies  Allergen Reactions  . Amlodipine     Swelling   . Lyrica [Pregabalin] Swelling    wgt gain  . Sulfonamide Derivatives Rash    Patient Active Problem List   Diagnosis Date Noted  . HYPERCHOLESTEROLEMIA 10/22/2010    Priority: High  . Chronic cholecystitis 10/11/2013  . Abdominal pain 09/12/2013  .  Cholelithiasis 09/12/2013  . Diabetic neuropathy 07/06/2013  . Diastolic dysfunction 05/02/1600  . SSS (sick sinus syndrome) 02/10/2013  . Hypertension 07/30/2012  . Pacemaker-Medtronic 07/19/2012  . Benign hypertensive heart disease without heart failure 01/28/2012  . DM 10/22/2010  . CAD 10/22/2010  . SICK SINUS SYNDROME 10/22/2010  . DEGENERATIVE JOINT DISEASE 10/22/2010    History  Smoking status  . Never Smoker   Smokeless tobacco  . Not on file    History  Alcohol Use No    Family History  Problem Relation Age of Onset  . Cancer Mother   . Cancer Father     Review of Systems: Constitutional: no fever chills diaphoresis or fatigue or change in weight.  Head and neck: no hearing loss, no epistaxis, no photophobia or visual disturbance. Respiratory: No cough, shortness of  breath or wheezing. Cardiovascular: No chest pain peripheral edema, palpitations. Gastrointestinal: No abdominal distention, no abdominal pain, no change in bowel habits hematochezia or melena. Genitourinary: No dysuria, no frequency, no urgency, no nocturia. Musculoskeletal:No arthralgias, no back pain, no gait disturbance or myalgias. Neurological: No dizziness, no headaches, no numbness, no seizures, no syncope, no weakness, no tremors. Hematologic: No lymphadenopathy, no easy bruising. Psychiatric: No confusion, no hallucinations, no sleep disturbance.    Physical Exam: Filed Vitals:   04/07/14 0948  BP: 140/92  Pulse: 68   the general appearance reveals an alert elderly woman in no distress.The head and neck exam reveals pupils equal and reactive.  Extraocular movements are full.  There is no scleral icterus.  The mouth and pharynx are normal.  The neck is supple.  The carotids reveal no bruits.  The jugular venous pressure is normal.  The  thyroid is not enlarged.  There is no lymphadenopathy.  The chest is clear to percussion and auscultation.  There are no rales or rhonchi.  Expansion of the chest is symmetrical.  The precordium is quiet.  The first heart sound is normal.  The second heart sound is physiologically split.  There is no murmur gallop rub or click.  There is no abnormal lift or heave.  The abdomen is soft and nontender.  The bowel sounds are normal.  The liver and spleen are not enlarged.  There are no abdominal masses.  There are no abdominal bruits.  Extremities reveal good pedal pulses.  There is no phlebitis or edema.  There is no cyanosis or clubbing.  Strength is normal and symmetrical in all extremities.  There is no lateralizing weakness.  There are no sensory deficits.  The skin is warm and dry.  There is no rash.     Assessment / Plan: 1.  Hypertensive heart disease without heart failure 2. sick sinus syndrome with functioning pacemaker 3. type 2 diabetes with  peripheral neuropathy 4. coronary artery disease with drug-eluting stents placed in September 2010 by Dr. Terrence Dupont 5. status post cholecystectomy 6. hypercholesterolemia.  Disposition.  Blood work today drawn and is pending.  Continue same medication.  Recheck in 4 months for office visit EKG A1c lipid panel hepatic function panel and basal metabolic panel.

## 2014-04-07 NOTE — Assessment & Plan Note (Signed)
Patient has had no recurrent chest pain.  She's not had to take any sublingual nitroglycerin.

## 2014-04-07 NOTE — Progress Notes (Signed)
Pacemaker interrogation for R wave recheck only. R waves were 4.0-5.54mV today. R waves were 2.8-34mV during last interrogation on 4-8. No episodes recorded. No changes were made this session. Patient will follow up with JA as scheduled.

## 2014-04-07 NOTE — Assessment & Plan Note (Signed)
The patient has noted improvement in her diabetic neuropathy pain on gabapentin.

## 2014-04-07 NOTE — Assessment & Plan Note (Signed)
No headaches, dizzy spells or syncope.  Her blood pressure in the office is high but her blood pressure at home is normal.

## 2014-04-07 NOTE — Patient Instructions (Signed)
Your physician recommends that you continue on your current medications as directed. Please refer to the Current Medication list given to you today.  Your physician wants you to follow-up in: 4 months with fasting labs (lp/bmet/hfp/a1c)  You will receive a reminder letter in the mail two months in advance. If you don't receive a letter, please call our office to schedule the follow-up appointment.  

## 2014-04-09 NOTE — Progress Notes (Signed)
Quick Note:  Please report to patient. The recent labs are stable. Continue same medication and careful diet. ______ 

## 2014-04-10 ENCOUNTER — Telehealth: Payer: Self-pay | Admitting: *Deleted

## 2014-04-10 NOTE — Telephone Encounter (Signed)
Mailed copy of labs and left message to call if any questions  

## 2014-04-10 NOTE — Telephone Encounter (Signed)
Message copied by Earvin Hansen on Mon Apr 10, 2014  3:09 PM ------      Message from: Darlin Coco      Created: Sun Apr 09, 2014  7:28 PM       Please report to patient.  The recent labs are stable. Continue same medication and careful diet. ------

## 2014-04-13 ENCOUNTER — Other Ambulatory Visit: Payer: Self-pay | Admitting: Cardiology

## 2014-04-19 ENCOUNTER — Encounter: Payer: Self-pay | Admitting: Internal Medicine

## 2014-05-02 ENCOUNTER — Other Ambulatory Visit: Payer: Self-pay | Admitting: *Deleted

## 2014-05-02 DIAGNOSIS — F419 Anxiety disorder, unspecified: Secondary | ICD-10-CM

## 2014-05-02 MED ORDER — LORAZEPAM 1 MG PO TABS: ORAL_TABLET | ORAL | Status: DC

## 2014-05-02 NOTE — Telephone Encounter (Signed)
Patient needs lorazepam refill sent to [facility]. Thanks, MI

## 2014-05-02 NOTE — Telephone Encounter (Signed)
Jessica [name], Please review

## 2014-05-10 ENCOUNTER — Other Ambulatory Visit: Payer: Self-pay | Admitting: Cardiology

## 2014-05-14 ENCOUNTER — Other Ambulatory Visit: Payer: Self-pay | Admitting: Cardiology

## 2014-06-08 ENCOUNTER — Other Ambulatory Visit: Payer: Self-pay | Admitting: Cardiology

## 2014-06-14 ENCOUNTER — Other Ambulatory Visit: Payer: Self-pay

## 2014-06-14 MED ORDER — HYDRALAZINE HCL 10 MG PO TABS: ORAL_TABLET | ORAL | Status: DC

## 2014-07-17 ENCOUNTER — Other Ambulatory Visit: Payer: Self-pay | Admitting: *Deleted

## 2014-07-17 MED ORDER — ROSUVASTATIN CALCIUM 10 MG PO TABS: ORAL_TABLET | ORAL | Status: DC

## 2014-07-26 ENCOUNTER — Other Ambulatory Visit: Payer: Self-pay | Admitting: Cardiology

## 2014-07-27 DIAGNOSIS — Z23 Encounter for immunization: Secondary | ICD-10-CM | POA: Diagnosis not present

## 2014-07-30 ENCOUNTER — Other Ambulatory Visit: Payer: Self-pay | Admitting: Cardiology

## 2014-07-30 DIAGNOSIS — E1143 Type 2 diabetes mellitus with diabetic autonomic (poly)neuropathy: Secondary | ICD-10-CM

## 2014-08-11 ENCOUNTER — Encounter: Payer: Self-pay | Admitting: *Deleted

## 2014-08-11 ENCOUNTER — Other Ambulatory Visit: Payer: Self-pay | Admitting: Cardiology

## 2014-08-14 ENCOUNTER — Encounter: Payer: Self-pay | Admitting: Cardiology

## 2014-08-14 ENCOUNTER — Ambulatory Visit (INDEPENDENT_AMBULATORY_CARE_PROVIDER_SITE_OTHER): Payer: Medicare Other | Admitting: Cardiology

## 2014-08-14 VITALS — BP 124/64 | HR 80 | Ht 63.0 in | Wt 147.0 lb

## 2014-08-14 DIAGNOSIS — I2510 Atherosclerotic heart disease of native coronary artery without angina pectoris: Secondary | ICD-10-CM | POA: Diagnosis not present

## 2014-08-14 DIAGNOSIS — I119 Hypertensive heart disease without heart failure: Secondary | ICD-10-CM

## 2014-08-14 DIAGNOSIS — I495 Sick sinus syndrome: Secondary | ICD-10-CM

## 2014-08-14 DIAGNOSIS — I209 Angina pectoris, unspecified: Secondary | ICD-10-CM

## 2014-08-14 DIAGNOSIS — E0842 Diabetes mellitus due to underlying condition with diabetic polyneuropathy: Secondary | ICD-10-CM

## 2014-08-14 DIAGNOSIS — I2583 Coronary atherosclerosis due to lipid rich plaque: Secondary | ICD-10-CM

## 2014-08-14 MED ORDER — ROSUVASTATIN CALCIUM 10 MG PO TABS: ORAL_TABLET | ORAL | Status: DC

## 2014-08-14 MED ORDER — HYDRALAZINE HCL 10 MG PO TABS: ORAL_TABLET | ORAL | Status: DC

## 2014-08-14 MED ORDER — NITROGLYCERIN 0.4 MG SL SUBL: 0.4000 mg | SUBLINGUAL_TABLET | SUBLINGUAL | Status: DC | PRN

## 2014-08-14 NOTE — Assessment & Plan Note (Signed)
Her blood pressure is remaining stable on current therapy.

## 2014-08-14 NOTE — Progress Notes (Signed)
Jessica [name] Date of Birth:  04/11/1927 [facility] [address] [place], [place]  [address] [phone]        Fax   [phone]   History of Present Illness: This pleasant 80 year old woman is seen for a office visit. She has a complex past medical history. She has a history of sick sinus syndrome and has a permanent pacemaker implanted on 07/14/10. She has had no further episodes of syncope since the pacemaker implantation. She has a history of ischemic heart disease and underwent insertion of 2 drug-eluting stents placed in the mid and distal right coronary artery in September 2010 by Dr. Terrence Dupont. She has a history of diabetes, essential hypertension, and high cholesterol. An echocardiogram in September 2011 showed normal ejection fraction of 94-70% with diastolic dysfunction.  She is status post cholecystectomy by Dr. Brantley Stage.  She has had an uneventful postoperative course and is not having any digestive symptoms other than for occasional bowel irregularity.  She does take stool softeners.  Current Outpatient Prescriptions  Medication Sig Dispense Refill  . aspirin 81 MG tablet Take 81 mg by mouth every morning.       . calcium carbonate (OS-CAL) 600 MG TABS tablet Take 600 mg by mouth 2 (two) times daily with a meal.      . carvedilol (COREG) 25 MG tablet Take 1 tablet (25 mg total) by mouth 2 (two) times daily.  180 tablet  3  . Cyanocobalamin (VITAMIN B12 PO) Take 1 tablet by mouth daily.       . fish oil-omega-3 fatty acids 1000 MG capsule Take 1 g by mouth every morning.       . gabapentin (NEURONTIN) 300 MG capsule TAKE ONE CAPSULE BY MOUTH ONCE DAILY  30 capsule  0  . hydrALAZINE (APRESOLINE) 10 MG tablet TAKE ONE TABLET BY MOUTH THREE TIMES DAILY  180 tablet  3  . hydrochlorothiazide (MICROZIDE) 12.5 MG capsule Take 1 capsule (12.5 mg total) by mouth daily.  90 capsule  3  . lisinopril (PRINIVIL,ZESTRIL) 20 MG tablet TAKE ONE TABLET BY MOUTH TWICE DAILY   60 tablet  11  . LORazepam (ATIVAN) 1 MG tablet TAKE ONE TABLET BY MOUTH THREE TIMES DAILY AS NEEDED  100 tablet  0  . metFORMIN (GLUCOPHAGE) 500 MG tablet TAKE ONE TABLET BY MOUTH ONCE DAILY WITH  BREAKFAST  90 tablet  3  . Multiple Vitamins-Minerals (ECHINACEA ACZ PO) Take by mouth.      . nitroGLYCERIN (NITROSTAT) 0.4 MG SL tablet Place 1 tablet (0.4 mg total) under the tongue every 5 (five) minutes as needed for chest pain. x3 doses as needed for chest pain  25 tablet  prn  . pyridOXINE (VITAMIN B-6) 100 MG tablet Take 100 mg by mouth daily.      . rosuvastatin (CRESTOR) 10 MG tablet TAKE ONE TABLET BY MOUTH ONCE DAILY IN THE MORNING  90 tablet  3  . senna (SENOKOT) 8.6 MG tablet Take 2 tablets by mouth daily.        No current facility-administered medications for this visit.    Allergies  Allergen Reactions  . Amlodipine     Swelling   . Lyrica [Pregabalin] Swelling    wgt gain  . Sulfonamide Derivatives Rash    Patient Active Problem List   Diagnosis Date Noted  . HYPERCHOLESTEROLEMIA 10/22/2010    Priority: High  . Chronic cholecystitis 10/11/2013  . Abdominal pain 09/12/2013  . Cholelithiasis 09/12/2013  .  Diabetic neuropathy 07/06/2013  . Diastolic dysfunction 53/61/4431  . SSS (sick sinus syndrome) 02/10/2013  . Hypertension 07/30/2012  . Pacemaker-Medtronic 07/19/2012  . Benign hypertensive heart disease without heart failure 01/28/2012  . DM 10/22/2010  . CAD 10/22/2010  . SICK SINUS SYNDROME 10/22/2010  . DEGENERATIVE JOINT DISEASE 10/22/2010    History  Smoking status  . Never Smoker   Smokeless tobacco  . Not on file    History  Alcohol Use No    Family History  Problem Relation Age of Onset  . Cancer Mother   . Cancer Father     Review of Systems: Constitutional: no fever chills diaphoresis or fatigue or change in weight.  Head and neck: no hearing loss, no epistaxis, no photophobia or visual disturbance. Respiratory: No cough, shortness  of breath or wheezing. Cardiovascular: No chest pain peripheral edema, palpitations. Gastrointestinal: No abdominal distention, no abdominal pain, no change in bowel habits hematochezia or melena. Genitourinary: No dysuria, no frequency, no urgency, no nocturia. Musculoskeletal:No arthralgias, no back pain, no gait disturbance or myalgias. Neurological: No dizziness, no headaches, no numbness, no seizures, no syncope, no weakness, no tremors. Hematologic: No lymphadenopathy, no easy bruising. Psychiatric: No confusion, no hallucinations, no sleep disturbance.    Physical Exam: Filed Vitals:   08/14/14 1607  BP: 124/64  Pulse: 80   the general appearance reveals an alert elderly woman in no distress.The head and neck exam reveals pupils equal and reactive.  Extraocular movements are full.  There is no scleral icterus.  The mouth and pharynx are normal.  The neck is supple.  The carotids reveal no bruits.  The jugular venous pressure is normal.  The  thyroid is not enlarged.  There is no lymphadenopathy.  The chest is clear to percussion and auscultation.  There are no rales or rhonchi.  Expansion of the chest is symmetrical.  The precordium is quiet.  The first heart sound is normal.  The second heart sound is physiologically split.  There is no murmur gallop rub or click.  There is no abnormal lift or heave.  The abdomen is soft and nontender.  The bowel sounds are normal.  The liver and spleen are not enlarged.  There are no abdominal masses.  There are no abdominal bruits.  Extremities reveal good pedal pulses.  There is no phlebitis or edema.  There is no cyanosis or clubbing.  Strength is normal and symmetrical in all extremities.  There is no lateralizing weakness.  There are no sensory deficits.  The skin is warm and dry.  There is no rash.  EKG shows paced atrial rhythm.  There are nonspecific ST-T wave changes.  Assessment / Plan: 1.  Hypertensive heart disease without heart failure 2.  sick sinus syndrome with functioning pacemaker 3. type 2 diabetes with peripheral neuropathy 4. coronary artery disease with drug-eluting stents placed in September 2010 by Dr. Terrence Dupont 5. status post cholecystectomy 6. hypercholesterolemia.  Disposition.   Continue current medications.  We prescribed a new bottle of sublingual nitroglycerin for her Recheck in 4 months for office visit and fasting lab work lipid panel hepatic function panel basal metabolic panel and hemoglobin A1c

## 2014-08-14 NOTE — Patient Instructions (Signed)
Your physician recommends that you continue on your current medications as directed. Please refer to the Current Medication list given to you today.  Your physician wants you to follow-up in: 4 months with fasting labs (lp/bmet/hfp/a1c)  You will receive a reminder letter in the mail two months in advance. If you don't receive a letter, please call our office to schedule the follow-up appointment.

## 2014-08-14 NOTE — Assessment & Plan Note (Signed)
The patient has not been having any hypoglycemic episodes.  She does have a thick callus on the base of her left foot.  I encouraged her to see her podiatrist.

## 2014-08-14 NOTE — Assessment & Plan Note (Signed)
The patient has occasional mild chest tightness if she exerts herself too much doing yard work.  The discomfort subsides immediately when she stops to rest.  Her nitroglycerin are outdated by several years.  we called her in some new ones today.

## 2014-08-17 ENCOUNTER — Encounter: Payer: Self-pay | Admitting: Cardiology

## 2014-08-17 DIAGNOSIS — E119 Type 2 diabetes mellitus without complications: Secondary | ICD-10-CM | POA: Diagnosis not present

## 2014-08-17 LAB — HM DIABETES EYE EXAM

## 2014-08-28 ENCOUNTER — Other Ambulatory Visit: Payer: Self-pay | Admitting: Cardiology

## 2014-09-07 ENCOUNTER — Ambulatory Visit (INDEPENDENT_AMBULATORY_CARE_PROVIDER_SITE_OTHER): Payer: Medicare Other | Admitting: Podiatrist

## 2014-09-07 ENCOUNTER — Encounter: Payer: Self-pay | Admitting: Podiatrist

## 2014-09-07 DIAGNOSIS — E0842 Diabetes mellitus due to underlying condition with diabetic polyneuropathy: Secondary | ICD-10-CM

## 2014-09-07 DIAGNOSIS — E1140 Type 2 diabetes mellitus with diabetic neuropathy, unspecified: Secondary | ICD-10-CM

## 2014-09-07 DIAGNOSIS — B351 Tinea unguium: Secondary | ICD-10-CM | POA: Diagnosis not present

## 2014-09-07 DIAGNOSIS — I2510 Atherosclerotic heart disease of native coronary artery without angina pectoris: Secondary | ICD-10-CM

## 2014-09-07 DIAGNOSIS — M79676 Pain in unspecified toe(s): Secondary | ICD-10-CM | POA: Diagnosis not present

## 2014-09-07 DIAGNOSIS — Q828 Other specified congenital malformations of skin: Secondary | ICD-10-CM | POA: Diagnosis not present

## 2014-09-07 DIAGNOSIS — M216X2 Other acquired deformities of left foot: Secondary | ICD-10-CM

## 2014-09-07 NOTE — Patient Instructions (Signed)
Diabetic Neuropathy Diabetic neuropathy is a nerve disease or nerve damage that is caused by diabetes mellitus. About half of all people with diabetes mellitus have some form of nerve damage. Nerve damage is more common in those who have had diabetes mellitus for many years and who generally have not had good control of their blood sugar (glucose) level. Diabetic neuropathy is a common complication of diabetes mellitus. There are three more common types of diabetic neuropathy and a fourth type that is less common and less understood:   Peripheral neuropathy--This is the most common type of diabetic neuropathy. It causes damage to the nerves of the feet and legs first and then eventually the hands and arms.The damage affects the ability to sense touch.  Autonomic neuropathy--This type causes damage to the autonomic nervous system, which controls the following functions:  Heartbeat.  Body temperature.  Blood pressure.  Urination.  Digestion.  Sweating.  Sexual function.  Focal neuropathy--Focal neuropathy can be painful and unpredictable and occurs most often in older adults with diabetes mellitus. It involves a specific nerve or one area and often comes on suddenly. It usually does not cause long-term problems.  Radiculoplexus neuropathy-- Sometimes called lumbosacral radiculoplexus neuropathy, radiculoplexus neuropathy affects the nerves of the thighs, hips, buttocks, or legs. It is more common in people with type 2 diabetes mellitus and in older men. It is characterized by debilitating pain, weakness, and atrophy, usually in the thigh muscles. CAUSES  The cause of peripheral, autonomic, and focal neuropathies is diabetes mellitus that is uncontrolled and high glucose levels. The cause of radiculoplexus neuropathy is unknown. However, it is thought to be caused by inflammation related to uncontrolled glucose levels. SIGNS AND SYMPTOMS  Peripheral Neuropathy Peripheral neuropathy develops  slowly over time. When the nerves of the feet and legs no longer work there may be:   Burning, stabbing, or aching pain in the legs or feet.  Inability to feel pressure or pain in your feet. This can lead to:  Thick calluses over pressure areas.  Pressure sores.  Ulcers.  Foot deformities.  Reduced ability to feel temperature changes.  Muscle weakness. Autonomic Neuropathy The symptoms of autonomic neuropathy vary depending on which nerves are affected. Symptoms may include:  Problems with digestion, such as:  Feeling sick to your stomach (nausea).  Vomiting.  Bloating.  Constipation.  Diarrhea.  Abdominal pain.  Difficulty with urination. This occurs if you lose your ability to sense when your bladder is full. Problems include:  Urine leakage (incontinence).  Inability to empty your bladder completely (retention).  Rapid or irregular heartbeat (palpitations).  Blood pressure drops when you stand up (orthostatic hypotension). When you stand up you may feel:  Dizzy.  Weak.  Faint.  In men, inability to attain and maintain an erection.  In women, vaginal dryness and problems with decreased sexual desire and arousal.  Problems with body temperature regulation.  Increased or decreased sweating. Focal Neuropathy  Abnormal eye movements or abnormal alignment of both eyes.  Weakness in the wrist.  Foot drop. This results in an inability to lift the foot properly and abnormal walking or foot movement.  Paralysis on one side of your face (Bell palsy).  Chest or abdominal pain. Radiculoplexus Neuropathy  Sudden, severe pain in your hip, thigh, or buttocks.  Weakness and wasting of thigh muscles.  Difficulty rising from a seated position.  Abdominal swelling.  Unexplained weight loss (usually more than 10 lb [4.5 kg]). DIAGNOSIS  Peripheral Neuropathy Your senses may   be tested. Sensory function testing can be done with:  A light touch using a  monofilament.  A vibration with tuning fork.  A sharp sensation with a pin prick. Other tests that can help diagnose neuropathy are:  Nerve conduction velocity. This test checks the transmission of an electrical current through a nerve.  Electromyography. This shows how muscles respond to electrical signals transmitted by nearby nerves.  Quantitative sensory testing. This is used to assess how your nerves respond to vibrations and changes in temperature. Autonomic Neuropathy Diagnosis is often based on reported symptoms. Tell your health care provider if you experience:   Dizziness.   Constipation.   Diarrhea.   Inappropriate urination or inability to urinate.   Inability to get or maintain an erection.  Tests that may be done include:   Electrocardiography or Holter monitor. These are tests that can help show problems with the heart rate or heart rhythm.   An X-ray exam may be done. Focal Neuropathy Diagnosis is made based on your symptoms and what your health care provider finds during your exam. Other tests may be done. They may include:  Nerve conduction velocities. This checks the transmission of electrical current through a nerve.  Electromyography. This shows how muscles respond to electrical signals transmitted by nearby nerves.  Quantitative sensory testing. This test is used to assess how your nerves respond to vibration and changes in temperature. Radiculoplexus Neuropathy  Often the first thing is to eliminate any other issue or problems that might be the cause, as there is no stick test for diagnosis.  X-ray exam of your spine and lumbar region.  Spinal tap to rule out cancer.  MRI to rule out other lesions. TREATMENT  Once nerve damage occurs, it cannot be reversed. The goal of treatment is to keep the disease or nerve damage from getting worse and affecting more nerve fibers. Controlling your blood glucose level is the key. Most people with  radiculoplexus neuropathy see at least a partial improvement over time. You will need to keep your blood glucose and HbA1c levels in the target range determined by your health care provider. Things that help control blood glucose levels include:   Blood glucose monitoring.   Meal planning.   Physical activity.   Diabetes medicine.  Over time, maintaining lower blood glucose levels helps lessen symptoms. Sometimes, prescription pain medicine is needed. HOME CARE INSTRUCTIONS:  Do not smoke.  Keep your blood glucose level in the range that you and your health care provider have determined acceptable for you.  Keep your blood pressure level in the range that you and your health care provider have determined acceptable for you.  Eat a well-balanced diet.  Be active every day.  Check your feet every day. SEEK MEDICAL CARE IF:   You have burning, stabbing, or aching pain in the legs or feet.  You are unable to feel pressure or pain in your feet.  You develop problems with digestion such as:  Nausea.  Vomiting.  Bloating.  Constipation.  Diarrhea.  Abdominal pain.  You have difficulty with urination, such as:  Incontinence.  Retention.  You have palpitations.  You develop orthostatic hypotension. When you stand up you may feel:  Dizzy.  Weak.  Faint.  You cannot attain and maintain an erection (in men).  You have vaginal dryness and problems with decreased sexual desire and arousal (in women).  You have severe pain in your thighs, legs, or buttocks.  You have unexplained weight loss.   Document Released: 12/29/2001 Document Revised: 08/10/2013 Document Reviewed: 03/31/2013 [employer] Patient Information 2015 [employer]. This information is not intended to replace advice given to you by your health care provider. Make sure you discuss any questions you have with your health care provider. 

## 2014-09-07 NOTE — Progress Notes (Signed)
   Subjective:    Patient ID: Jessica [name], female    DOB: Apr 23, 1927, 80 y.o.   MRN: [id number]  HPI    Review of Systems  HENT: Positive for hearing loss.   Musculoskeletal: Positive for back pain and gait problem.  Allergic/Immunologic: Positive for environmental allergies.       Objective:   Physical Exam Vascular exam reveals pedal pulses palpable at 1/4 DP and PT bilateral.neurological sensation is decreased to be a [employer] monofilament at 3/5 sites bilateral light touch and vibratory sensation are also decreased. Prominent plantarflexed metatarsals and metatarsal through the left foot is noted. Prominent callus is also present in this area. All digital nails are elongated, thickened, discolored, dystrophic and clinically mycotic.       Assessment & Plan:  Neuropathy, callus sub-metatarsal 3 left foot, symptomatic mycotic toenails  Plan: Debrided the toenails without complication. Recommending increasing her Neurontin to twice daily. Also debrided the callus without complication. She'll be seen back routinely every 3 months for follow-up care.

## 2014-09-29 ENCOUNTER — Other Ambulatory Visit: Payer: Self-pay | Admitting: *Deleted

## 2014-09-29 DIAGNOSIS — F419 Anxiety disorder, unspecified: Secondary | ICD-10-CM

## 2014-10-02 NOTE — Telephone Encounter (Signed)
Spoke with [name] and advised left on [employer] VM

## 2014-10-02 NOTE — Telephone Encounter (Signed)
Please address this. 

## 2014-10-02 NOTE — Telephone Encounter (Signed)
F/U   Patient daughter is calling to check on prescription for Lorazepam 1mg , states it should have been refilled. Please contact daughter teresa at [phone].

## 2014-10-03 ENCOUNTER — Encounter: Payer: Self-pay | Admitting: Cardiology

## 2014-10-04 ENCOUNTER — Telehealth: Payer: Self-pay | Admitting: *Deleted

## 2014-10-04 NOTE — Telephone Encounter (Signed)
New Message  Left vm for pt concerning flu shot 

## 2014-10-04 NOTE — Telephone Encounter (Signed)
Follow up  Pt called back and stated she had received the flu shot at [facility] this year but did not specify when.

## 2014-10-17 ENCOUNTER — Other Ambulatory Visit: Payer: Self-pay | Admitting: Cardiology

## 2014-10-25 ENCOUNTER — Ambulatory Visit (INDEPENDENT_AMBULATORY_CARE_PROVIDER_SITE_OTHER): Payer: Medicare Other | Admitting: Internal Medicine

## 2014-10-25 ENCOUNTER — Encounter: Payer: Self-pay | Admitting: Internal Medicine

## 2014-10-25 VITALS — BP 140/80 | HR 86 | Ht 64.0 in | Wt 158.0 lb

## 2014-10-25 DIAGNOSIS — I2510 Atherosclerotic heart disease of native coronary artery without angina pectoris: Secondary | ICD-10-CM | POA: Diagnosis not present

## 2014-10-25 DIAGNOSIS — I519 Heart disease, unspecified: Secondary | ICD-10-CM | POA: Diagnosis not present

## 2014-10-25 DIAGNOSIS — I10 Essential (primary) hypertension: Secondary | ICD-10-CM

## 2014-10-25 DIAGNOSIS — I480 Paroxysmal atrial fibrillation: Secondary | ICD-10-CM | POA: Diagnosis not present

## 2014-10-25 DIAGNOSIS — I495 Sick sinus syndrome: Secondary | ICD-10-CM

## 2014-10-25 DIAGNOSIS — I5189 Other ill-defined heart diseases: Secondary | ICD-10-CM

## 2014-10-25 DIAGNOSIS — I4891 Unspecified atrial fibrillation: Secondary | ICD-10-CM | POA: Insufficient documentation

## 2014-10-25 LAB — MDC_IDC_ENUM_SESS_TYPE_INCLINIC
Battery Impedance: 201 Ohm
Battery Voltage: 2.79 V
Brady Statistic AP VP Percent: 26 %
Brady Statistic AP VS Percent: 74 %
Brady Statistic AS VP Percent: 0 %
Brady Statistic AS VS Percent: 0 %
Date Time Interrogation Session: 20151223101611
Lead Channel Impedance Value: 582 Ohm
Lead Channel Impedance Value: 642 Ohm
Lead Channel Pacing Threshold Amplitude: 0.5 V
Lead Channel Pacing Threshold Amplitude: 0.5 V
Lead Channel Pacing Threshold Pulse Width: 0.4 ms
Lead Channel Pacing Threshold Pulse Width: 0.4 ms
Lead Channel Setting Pacing Amplitude: 2.5 V
Lead Channel Setting Pacing Pulse Width: 0.4 ms
MDC IDC MSMT BATTERY REMAINING LONGEVITY: 120 mo
MDC IDC MSMT LEADCHNL RV SENSING INTR AMPL: 4 mV
MDC IDC SET LEADCHNL RA PACING AMPLITUDE: 2 V
MDC IDC SET LEADCHNL RV SENSING SENSITIVITY: 2 mV

## 2014-10-25 NOTE — Progress Notes (Signed)
Primary Cardiologist Dr [name]  The patient presents today for routine electrophysiology followup.  Since last being seen in our clinic, the patient reports doing very well.  She continues to do her house work without limitation. Today, she denies symptoms of palpitations, chest pain, shortness of breath, orthopnea, PND, lower extremity edema, dizziness, presyncope, syncope, or neurologic sequela.  The patient feels that she is tolerating medications without difficulties and is otherwise without complaint today.   Past Medical History  Diagnosis Date  . Hypertension   . Diabetes mellitus     NON INSULIN DEPENDENT  . Hypercholesterolemia   . DJD (degenerative joint disease)   . History of diabetic neuropathy   . Chest pain   . DOE (dyspnea on exertion)   . IHD (ischemic heart disease)   . SSS (sick sinus syndrome)     s/p PPM by JA for SSS and syncope 9/11  . Breast cancer   . GERD (gastroesophageal reflux disease)   . Hemorrhoids   . CAD (coronary artery disease)   . Cholelithiasis 09/12/2013    Lap Chole on 09/14/13   . Paroxysmal atrial fibrillation     discovered on PPM interrogation 12/15, CHAD2VASC score is 6   Past Surgical History  Procedure Laterality Date  . Cardiac catheterization  07/05/2009    EF 55-60%  . Insert / replace / remove pacemaker  9/11    SSS and syncope, implanted by JA (MDT)  . Mastectomy  1992    BILATERAL WITH RECONSTRUCTION  . Colonoscopy    . Ercp N/A 09/13/2013    Procedure: ENDOSCOPIC RETROGRADE CHOLANGIOPANCREATOGRAPHY (ERCP);  Surgeon: [name], MD;  Location: [facility] ENDOSCOPY;  Service: Endoscopy;  Laterality: N/A;  . Cholecystectomy N/A 09/14/2013    Procedure: LAPAROSCOPIC CHOLECYSTECTOMY WITH INTRAOPERATIVE CHOLANGIOGRAM;  Surgeon: [name], MD;  Location: [place] OR;  Service: General;  Laterality: N/A;    Current Outpatient Prescriptions  Medication Sig Dispense Refill  . aspirin 81 MG tablet Take 81 mg by mouth every morning.      . calcium carbonate (OS-CAL) 600 MG TABS tablet Take 600 mg by mouth 2 (two) times daily with a meal.    . carvedilol (COREG) 25 MG tablet Take 1 tablet (25 mg total) by mouth 2 (two) times daily. 180 tablet 3  . Cyanocobalamin (VITAMIN B12 PO) Take 1 tablet by mouth daily.     . fish oil-omega-3 fatty acids 1000 MG capsule Take 1 g by mouth every morning.     . gabapentin (NEURONTIN) 300 MG capsule TAKE ONE CAPSULE BY MOUTH ONCE DAILY 30 capsule 11  . hydrALAZINE (APRESOLINE) 10 MG tablet TAKE ONE TABLET BY MOUTH THREE TIMES DAILY 180 tablet 3  . hydrochlorothiazide (MICROZIDE) 12.5 MG capsule TAKE ONE CAPSULE BY MOUTH ONCE DAILY 90 capsule 0  . lisinopril (PRINIVIL,ZESTRIL) 20 MG tablet TAKE ONE TABLET BY MOUTH TWICE DAILY 60 tablet 11  . LORazepam (ATIVAN) 1 MG tablet TAKE ONE TABLET BY MOUTH THREE TIMES DAILY AS NEEDED 100 tablet 0  . metFORMIN (GLUCOPHAGE) 500 MG tablet TAKE ONE TABLET BY MOUTH ONCE DAILY WITH  BREAKFAST 90 tablet 3  . Multiple Vitamins-Minerals (ECHINACEA ACZ PO) Take by mouth.    . nitroGLYCERIN (NITROSTAT) 0.4 MG SL tablet Place 1 tablet (0.4 mg total) under the tongue every 5 (five) minutes as needed for chest pain. x3 doses as needed for chest pain 25 tablet prn  . pyridOXINE (VITAMIN B-6) 100 MG tablet Take 100 mg by mouth  daily.    . rosuvastatin (CRESTOR) 10 MG tablet TAKE ONE TABLET BY MOUTH ONCE DAILY IN THE MORNING 90 tablet 3  . senna (SENOKOT) 8.6 MG tablet Take 2 tablets by mouth daily.      No current facility-administered medications for this visit.    Allergies  Allergen Reactions  . Amlodipine     Swelling   . Lyrica [Pregabalin] Swelling    wgt gain  . Sulfonamide Derivatives Rash    History   Social History  . Marital Status: Widowed    Spouse Name: N/A    Number of Children: N/A  . Years of Education: N/A   Occupational History  . Not on file.   Social History Main Topics  . Smoking status: Never Smoker   . Smokeless tobacco:  Not on file  . Alcohol Use: No  . Drug Use: No  . Sexual Activity: Not on file   Other Topics Concern  . Not on file   Social History Narrative    Family History  Problem Relation Age of Onset  . Cancer Mother   . Cancer Father    ROS- sinus pressure, SOB, all other systems are reviewed and negative except as per HPI  Physical Exam: Filed Vitals:   10/25/14 0930  BP: 140/80  Pulse: 86  Height: 5\' 4"  (1.626 m)  Weight: 158 lb (71.668 kg)    GEN- The patient is well appearing, alert and oriented x 3 today.   Head- normocephalic, atraumatic Eyes-  Sclera clear, conjunctiva pink Ears- hearing intact Oropharynx- clear Neck- supple, no JVP Lymph- no cervical lymphadenopathy Lungs- Clear to ausculation bilaterally, normal work of breathing Chest- pacemaker pocket is well healed Heart- Regular rate and rhythm, no murmurs, rubs or gallops, PMI not laterally displaced GI- soft, NT, ND, + BS Extremities- no clubbing, cyanosis, or edema  Pacemaker interrogation- reviewed in detail today,  See PACEART report  Assessment and Plan:  SICK SINUS SYNDROME  Normal pacemaker function  See Pace Art report  No changes today   HYPERTENSION  Stable No change required today  Paroxysmal atrial fibrillation This is a new diagnosis Device interrogation reveals longest episode 07/03/14 lasting 7 hours, 16 minutes This patients CHA2DS2-VASc Score and unadjusted Ischemic Stroke Rate (% per year) is equal to 9.7 % stroke rate/year from a score of 6  She should ideally be started on anticoagulation.  I will forward to Dr [name] for his input as he knows the patient well. I think that stopping ASA and starting eliquis would be the preferred strategy.  Above score calculated as 1 point each if present [CHF, HTN, DM, Vascular=MI/PAD/Aortic Plaque, Age if 65-74, or Female] Above score calculated as 2 points each if present [Age > 75, or Stroke/TIA/TE]   Follow-up with Dr  [name] Importance of compliance with carelink was stressed today.  She is willing to comply. Will follow-up with device clinic in 1 year

## 2014-10-25 NOTE — Patient Instructions (Signed)
Your physician wants you to follow-up in: 12 months Dr. [name]. You will receive a reminder letter in the mail two months in advance. If you don't receive a letter, please call our office to schedule the follow-up appointment.  Remote monitoring is used to monitor your Pacemaker or ICD from home. This monitoring reduces the number of office visits required to check your device to one time per year. It allows [place] to keep an eye on the functioning of your device to ensure it is working properly. You are scheduled for a device check from home on 01/24/2015. You may send your transmission at any time that day. If you have a wireless device, the transmission will be sent automatically. After your physician reviews your transmission, you will receive a postcard with your next transmission date.

## 2014-11-06 ENCOUNTER — Telehealth: Payer: Self-pay | Admitting: Cardiology

## 2014-11-06 NOTE — Telephone Encounter (Signed)
-----   Message from Ranee Gosselin, RN sent at 10/25/2014 10:17 AM EST ----- Follow up with patient in 1 week (12/30) about remote monitoring.  She was instructed how to transmit and to send a trial transmission once she was home from her appt 12/23. Thanks!

## 2014-11-06 NOTE — Telephone Encounter (Signed)
Pt stated that he daughter can hook up home monitor. She is aware that if they need any help to call me.

## 2014-11-23 ENCOUNTER — Telehealth: Payer: Self-pay | Admitting: *Deleted

## 2014-11-23 MED ORDER — APIXABAN 5 MG PO TABS: 5.0000 mg | ORAL_TABLET | Freq: Two times a day (BID) | ORAL | Status: DC

## 2014-11-23 NOTE — Telephone Encounter (Signed)
Advised patient, verbalized understanding    Please report. She has intermittent atrial fibrillation. Start Apixaban 5 mg twice a day. If she is taking aspirin, she may stop the aspirin now.        Above per  Dr. [name]

## 2014-12-12 ENCOUNTER — Other Ambulatory Visit (INDEPENDENT_AMBULATORY_CARE_PROVIDER_SITE_OTHER): Payer: Medicare Other | Admitting: *Deleted

## 2014-12-12 ENCOUNTER — Ambulatory Visit (INDEPENDENT_AMBULATORY_CARE_PROVIDER_SITE_OTHER): Payer: Medicare Other | Admitting: Cardiology

## 2014-12-12 ENCOUNTER — Encounter: Payer: Self-pay | Admitting: Cardiology

## 2014-12-12 VITALS — BP 148/102 | HR 78 | Ht 64.0 in | Wt 156.0 lb

## 2014-12-12 DIAGNOSIS — I10 Essential (primary) hypertension: Secondary | ICD-10-CM

## 2014-12-12 DIAGNOSIS — I480 Paroxysmal atrial fibrillation: Secondary | ICD-10-CM

## 2014-12-12 DIAGNOSIS — E0842 Diabetes mellitus due to underlying condition with diabetic polyneuropathy: Secondary | ICD-10-CM

## 2014-12-12 DIAGNOSIS — E1142 Type 2 diabetes mellitus with diabetic polyneuropathy: Secondary | ICD-10-CM | POA: Diagnosis not present

## 2014-12-12 DIAGNOSIS — I119 Hypertensive heart disease without heart failure: Secondary | ICD-10-CM | POA: Diagnosis not present

## 2014-12-12 LAB — LIPID PANEL
CHOLESTEROL: 131 mg/dL (ref 0–200)
HDL: 54.4 mg/dL (ref >39.00)
LDL CALC: 62 mg/dL (ref 0–99)
NONHDL: 76.6
Total CHOL/HDL Ratio: 2
Triglycerides: 74 mg/dL (ref 0.0–149.0)
VLDL: 14.8 mg/dL (ref 0.0–40.0)

## 2014-12-12 LAB — BASIC METABOLIC PANEL
BUN: 24 mg/dL — AB (ref 6–23)
CHLORIDE: 108 meq/L (ref 96–112)
CO2: 29 meq/L (ref 19–32)
CREATININE: 1.09 mg/dL (ref 0.40–1.20)
Calcium: 10 mg/dL (ref 8.4–10.5)
GFR: 50.62 mL/min — ABNORMAL LOW (ref >60.00)
Glucose, Bld: 102 mg/dL — ABNORMAL HIGH (ref 70–99)
Potassium: 3.8 mEq/L (ref 3.5–5.1)
Sodium: 142 mEq/L (ref 135–145)

## 2014-12-12 LAB — HEPATIC FUNCTION PANEL
ALBUMIN: 3.9 g/dL (ref 3.5–5.2)
ALT: 18 U/L (ref 0–35)
AST: 21 U/L (ref 0–37)
Alkaline Phosphatase: 61 U/L (ref 39–117)
Bilirubin, Direct: 0.1 mg/dL (ref 0.0–0.3)
TOTAL PROTEIN: 6.6 g/dL (ref 6.0–8.3)
Total Bilirubin: 0.6 mg/dL (ref 0.2–1.2)

## 2014-12-12 LAB — HEMOGLOBIN A1C: HEMOGLOBIN A1C: 6.1 % (ref 4.6–6.5)

## 2014-12-12 NOTE — Progress Notes (Signed)
Cardiology Office Note   Date:  12/12/2014   ID:  Jessica GRONDAHL, DOB Aug 28, 1927, MRN [id number]  PCP:  Darlin Coco, MD  Cardiologist:   Darlin Coco, MD   No chief complaint on file.     History of Present Illness: Jessica [name] is a 81 y.o. female who presents for scheduled four-month follow-up office visit.  This pleasant 81 year old woman is seen for a office visit. She has a complex past medical history. She has a history of sick sinus syndrome and has a permanent pacemaker implanted on 07/14/10. She has had no further episodes of syncope since the pacemaker implantation. She has a history of ischemic heart disease and underwent insertion of 2 drug-eluting stents placed in the mid and distal right coronary artery in September 2010 by Dr. Terrence Dupont. She has a history of diabetes, essential hypertension, and high cholesterol. An echocardiogram in September 2011 showed normal ejection fraction of 40-98% with diastolic dysfunction. She is status post cholecystectomy by Dr. Brantley Stage. She has had an uneventful postoperative course and is not having any digestive symptoms other than for occasional bowel irregularity. She does take stool softeners. Since last visit she has not been experiencing any chest pain or shortness of breath.  She is not been aware of any racing of her heart.  She has not had any evidence of GI bleeding.  Her previous diarrhea has improved. Blood pressure was higher today but she neglected to take any of her medications today. 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  Past Medical History  Diagnosis Date  . Hypertension   . Diabetes mellitus     NON INSULIN DEPENDENT  . Hypercholesterolemia   . DJD (degenerative joint disease)   . History of diabetic neuropathy   . Chest pain   . DOE (dyspnea  on exertion)   . IHD (ischemic heart disease)   . SSS (sick sinus syndrome)     s/p PPM by JA for SSS and syncope 9/11  . Breast cancer   . GERD (gastroesophageal reflux disease)   . Hemorrhoids   . CAD (coronary artery disease)   . Cholelithiasis 09/12/2013    Lap Chole on 09/14/13   . Paroxysmal atrial fibrillation     discovered on PPM interrogation 12/15, CHAD2VASC score is 6    Past Surgical History  Procedure Laterality Date  . Cardiac catheterization  07/05/2009    EF 55-60%  . Insert / replace / remove pacemaker  9/11    SSS and syncope, implanted by JA (MDT)  . Mastectomy  1992    BILATERAL WITH RECONSTRUCTION  . Colonoscopy    . Ercp N/A 09/13/2013    Procedure: ENDOSCOPIC RETROGRADE CHOLANGIOPANCREATOGRAPHY (ERCP);  Surgeon: [name], MD;  [facility] ENDOSCOPY;  Service: Endoscopy;  Laterality: N/A;  . Cholecystectomy N/A 09/14/2013    Procedure: LAPAROSCOPIC CHOLECYSTECTOMY WITH INTRAOPERATIVE CHOLANGIOGRAM;  Surgeon: [name], MD;  Location: [facility] OR;  Service: General;  Laterality: N/A;     Current Outpatient Prescriptions  Medication Sig Dispense Refill  . apixaban (ELIQUIS) 5 MG TABS tablet Take 1 tablet (5 mg total) by mouth 2 (two) times daily. 60 tablet 5  . aspirin 81 MG tablet Take 81 mg by mouth daily.    . calcium carbonate (OS-CAL) 600  MG TABS tablet Take 600 mg by mouth 2 (two) times daily with a meal.    . carvedilol (COREG) 25 MG tablet Take 1 tablet (25 mg total) by mouth 2 (two) times daily. 180 tablet 3  . Cyanocobalamin (VITAMIN B12 PO) Take 1 tablet by mouth daily.     . fish oil-omega-3 fatty acids 1000 MG capsule Take 1 g by mouth every morning.     . gabapentin (NEURONTIN) 300 MG capsule TAKE ONE CAPSULE BY MOUTH ONCE DAILY 30 capsule 11  . hydrALAZINE (APRESOLINE) 10 MG tablet TAKE ONE TABLET BY MOUTH THREE TIMES DAILY 180 tablet 3  . hydrochlorothiazide (MICROZIDE) 12.5 MG capsule TAKE ONE CAPSULE BY MOUTH ONCE DAILY 90 capsule  0  . lisinopril (PRINIVIL,ZESTRIL) 20 MG tablet TAKE ONE TABLET BY MOUTH TWICE DAILY 60 tablet 11  . LORazepam (ATIVAN) 1 MG tablet TAKE ONE TABLET BY MOUTH THREE TIMES DAILY AS NEEDED 100 tablet 0  . metFORMIN (GLUCOPHAGE) 500 MG tablet TAKE ONE TABLET BY MOUTH ONCE DAILY WITH  BREAKFAST 90 tablet 3  . Multiple Vitamins-Minerals (ECHINACEA ACZ PO) Take by mouth.    . nitroGLYCERIN (NITROSTAT) 0.4 MG SL tablet Place 1 tablet (0.4 mg total) under the tongue every 5 (five) minutes as needed for chest pain. x3 doses as needed for chest pain 25 tablet prn  . pyridOXINE (VITAMIN B-6) 100 MG tablet Take 100 mg by mouth daily.    . rosuvastatin (CRESTOR) 10 MG tablet TAKE ONE TABLET BY MOUTH ONCE DAILY IN THE MORNING 90 tablet 3  . senna (SENOKOT) 8.6 MG tablet Take 2 tablets by mouth daily.      No current facility-administered medications for this visit.    Allergies:   Amlodipine; Lyrica; and Sulfonamide derivatives   Social History:  The patient  reports that she has never smoked. She does not have any smokeless tobacco history on file. She reports that she does not drink alcohol or use illicit drugs.   Family History:  The patient's family history includes Cancer in her father and mother.    ROS:  Please see the history of present illness.   Otherwise, review of systems are positive for none.   All other systems are reviewed and negative.    PHYSICAL EXAM: VS:  BP 148/102 mmHg  Pulse 78  Ht 5\' 4"  (1.626 m)  Wt 156 lb (70.761 kg)  BMI 26.76 kg/m2 , BMI Body mass index is 26.76 kg/(m^2). GEN: Well nourished, well developed, in no acute distress HEENT: normal Neck: no JVD, carotid bruits, or masses Cardiac: RRR; no murmurs, rubs, or gallops,no edema  Respiratory:  clear to auscultation bilaterally, normal work of breathing GI: soft, nontender, nondistended, + BS MS: no deformity or atrophy Skin: warm and dry, no rash Neuro:  Strength and sensation are intact Psych: euthymic mood,  full affect   EKG:  EKG is not ordered today   Recent Labs: 12/12/2014: ALT 18; BUN 24*; Creatinine 1.09; Potassium 3.8; Sodium 142    Lipid Panel    Component Value Date/Time   CHOL 131 12/12/2014 0921   TRIG 74.0 12/12/2014 0921   HDL 54.40 12/12/2014 0921   CHOLHDL 2 12/12/2014 0921   VLDL 14.8 12/12/2014 0921   LDLCALC 62 12/12/2014 0921   LDLDIRECT 152.0 10/10/2013 1619      Wt Readings from Last 3 Encounters:  12/12/14 156 lb (70.761 kg)  10/25/14 158 lb (71.668 kg)  08/14/14 147 lb (66.679 kg)  ASSESSMENT AND PLAN:  1. Hypertensive heart disease without heart failure 2. sick sinus syndrome with functioning pacemaker 3. type 2 diabetes with peripheral neuropathy 4. coronary artery disease with drug-eluting stents placed in September 2010 by Dr. Terrence Dupont 5. status post cholecystectomy 6. Hypercholesterolemia. 7.  Paroxysmal atrial fibrillation with chads vascular score of 6.  On Eliquis   Current medicines are reviewed at length with the patient today.  The patient does not have concerns regarding medicines.  The following changes have been made:  no change  Labs/ tests ordered today include  Orders Placed This Encounter  Procedures  . Lipid panel  . Hepatic function panel  . Basic metabolic panel     Disposition:   FU with Dr. [name] in 4 months for office visit EKG lipid panel hepatic function panel and basal metabolic panel. She was reminded next time to take her morning blood pressure medicines before she comes.    Signed, Darlin Coco, MD  12/12/2014 7:11 PM    [facility] [place], [place], [place]  [address] Phone: [phone]; Fax: [phone]

## 2014-12-12 NOTE — Patient Instructions (Signed)
Your physician recommends that you continue on your current medications as directed. Please refer to the Current Medication list given to you today. Your physician recommends that you schedule a follow-up appointment in: 4 months with fasting labs (lp/bmet/hfp) and ekg  

## 2014-12-13 NOTE — Progress Notes (Signed)
Quick Note:  Please report to patient. The recent labs are stable. Continue same medication and careful diet. ______ 

## 2014-12-14 ENCOUNTER — Encounter: Payer: Self-pay | Admitting: Podiatrist

## 2014-12-14 ENCOUNTER — Ambulatory Visit (INDEPENDENT_AMBULATORY_CARE_PROVIDER_SITE_OTHER): Payer: Medicare Other | Admitting: Podiatrist

## 2014-12-14 DIAGNOSIS — B351 Tinea unguium: Secondary | ICD-10-CM

## 2014-12-14 DIAGNOSIS — M79676 Pain in unspecified toe(s): Secondary | ICD-10-CM

## 2014-12-14 DIAGNOSIS — E1140 Type 2 diabetes mellitus with diabetic neuropathy, unspecified: Secondary | ICD-10-CM

## 2014-12-14 NOTE — Progress Notes (Signed)
   Subjective:    Patient ID: Jessica [name], female    DOB: Nov 18, 1926, 81 y.o.   MRN: [id number]  Patient presents for continued at risk foot care- she has neuropathy and pre ulcerative lesions which we are watching carefully.      Objective:   Physical Exam Vascular exam reveals pedal pulses palpable at 1/4 DP and PT bilateral.neurological sensation is decreased to be a [employer] monofilament at 3/5 sites bilateral light touch and vibratory sensation are also decreased. Prominent plantarflexed metatarsals and metatarsal through the left foot is noted. Prominent callus is also present in this area. All digital nails are elongated, thickened, discolored, dystrophic and clinically mycotic.       Assessment & Plan:  Neuropathy, callus sub-metatarsal 3 left foot, symptomatic mycotic toenails  Plan: Debrided the toenails without complication. Callus did not require trimming today and she will continue to offload and pad. She'll be seen back routinely every 3 months for follow-up care.

## 2014-12-21 ENCOUNTER — Telehealth: Payer: Self-pay | Admitting: *Deleted

## 2014-12-21 NOTE — Telephone Encounter (Signed)
Pt 's dtr, [name] states pt has a swollen ankle since Monday.  I told [name] to call tomorrow morning at 800am to schedule an appt and I would leave a message for the schedulers to call also 1st thing in the morning to schedule for tomorrow, but if the ankle worsens or has red streaks go to the ER.  Pt's dtr agreed.

## 2014-12-22 ENCOUNTER — Ambulatory Visit: Payer: Medicare Other

## 2014-12-22 ENCOUNTER — Encounter: Payer: Self-pay | Admitting: Podiatrist

## 2014-12-22 ENCOUNTER — Ambulatory Visit (INDEPENDENT_AMBULATORY_CARE_PROVIDER_SITE_OTHER): Payer: Medicare Other | Admitting: Podiatrist

## 2014-12-22 VITALS — BP 174/82 | HR 72 | Resp 16

## 2014-12-22 DIAGNOSIS — M79671 Pain in right foot: Secondary | ICD-10-CM

## 2014-12-22 DIAGNOSIS — M109 Gout, unspecified: Secondary | ICD-10-CM

## 2014-12-22 DIAGNOSIS — M10071 Idiopathic gout, right ankle and foot: Secondary | ICD-10-CM | POA: Diagnosis not present

## 2014-12-22 NOTE — Progress Notes (Signed)
Chief Complaint  Patient presents with  . Foot Pain    1st MPJ and medial foot right    "Its swollen and red"     HPI: Patient is 81 y.o. female who presents today for red, painful and warmth to the right foot at the ankle and first metatarsophalangeal joint for 2 days. She relates no trauma or injury to the foot. She has had gout in the past and states that her physician injected her with steroid and that this helped her problem. She has not had a gout flare in several years.   Allergies  Allergen Reactions  . Amlodipine     Swelling   . Lyrica [Pregabalin] Swelling    wgt gain  . Sulfonamide Derivatives Rash    Physical Exam  Neurovascular status unchanged with pedal pulses palpable at 1/4 DP and PT bilateral and decrease in neurological sensation bilateral. Right first metatarsophalangeal joint is red, hot, swollen and painful with range of motion. Mild swelling of the ankle is also present. X-rays are negative for fracture.  Assessment: Gout right foot  Plan: Recommended a steroid injection into the first metatarsophalangeal joint region and this was carried out today under sterile technique and without complication and the patient tolerated this well. Because of her list of medications elected to try the injection first and if it is not beneficial she will call it can consider oral therapy. Otherwise she'll be seen back for her routine care appointment.

## 2014-12-22 NOTE — Telephone Encounter (Signed)
Patient's daughter called and we scheduled an appointment to bring her mother in at 1:15pm today.

## 2014-12-22 NOTE — Patient Instructions (Signed)

## 2014-12-23 ENCOUNTER — Emergency Department (HOSPITAL_COMMUNITY): Payer: Medicare Other

## 2014-12-23 ENCOUNTER — Emergency Department (HOSPITAL_COMMUNITY)
Admission: EM | Admit: 2014-12-23 | Discharge: 2014-12-23 | Disposition: A | Discharge status: Home / Self Care | Payer: Medicare Other | Attending: Emergency Medicine | Admitting: Emergency Medicine

## 2014-12-23 ENCOUNTER — Encounter (HOSPITAL_COMMUNITY): Payer: Self-pay | Admitting: Emergency Medicine

## 2014-12-23 DIAGNOSIS — S299XXA Unspecified injury of thorax, initial encounter: Secondary | ICD-10-CM | POA: Diagnosis not present

## 2014-12-23 DIAGNOSIS — Z79899 Other long term (current) drug therapy: Secondary | ICD-10-CM | POA: Insufficient documentation

## 2014-12-23 DIAGNOSIS — I10 Essential (primary) hypertension: Secondary | ICD-10-CM | POA: Diagnosis not present

## 2014-12-23 DIAGNOSIS — Y998 Other external cause status: Secondary | ICD-10-CM | POA: Diagnosis not present

## 2014-12-23 DIAGNOSIS — S42491A Other displaced fracture of lower end of right humerus, initial encounter for closed fracture: Secondary | ICD-10-CM | POA: Insufficient documentation

## 2014-12-23 DIAGNOSIS — S0990XA Unspecified injury of head, initial encounter: Secondary | ICD-10-CM | POA: Diagnosis not present

## 2014-12-23 DIAGNOSIS — Z7982 Long term (current) use of aspirin: Secondary | ICD-10-CM | POA: Diagnosis not present

## 2014-12-23 DIAGNOSIS — I2510 Atherosclerotic heart disease of native coronary artery without angina pectoris: Secondary | ICD-10-CM | POA: Insufficient documentation

## 2014-12-23 DIAGNOSIS — S42201A Unspecified fracture of upper end of right humerus, initial encounter for closed fracture: Secondary | ICD-10-CM | POA: Diagnosis not present

## 2014-12-23 DIAGNOSIS — S42211A Unspecified displaced fracture of surgical neck of right humerus, initial encounter for closed fracture: Secondary | ICD-10-CM | POA: Diagnosis not present

## 2014-12-23 DIAGNOSIS — Z8719 Personal history of other diseases of the digestive system: Secondary | ICD-10-CM | POA: Insufficient documentation

## 2014-12-23 DIAGNOSIS — M1990 Unspecified osteoarthritis, unspecified site: Secondary | ICD-10-CM | POA: Insufficient documentation

## 2014-12-23 DIAGNOSIS — E780 Pure hypercholesterolemia: Secondary | ICD-10-CM | POA: Diagnosis not present

## 2014-12-23 DIAGNOSIS — Y9289 Other specified places as the place of occurrence of the external cause: Secondary | ICD-10-CM | POA: Diagnosis not present

## 2014-12-23 DIAGNOSIS — I4891 Unspecified atrial fibrillation: Secondary | ICD-10-CM | POA: Diagnosis not present

## 2014-12-23 DIAGNOSIS — Z853 Personal history of malignant neoplasm of breast: Secondary | ICD-10-CM | POA: Insufficient documentation

## 2014-12-23 DIAGNOSIS — W010XXA Fall on same level from slipping, tripping and stumbling without subsequent striking against object, initial encounter: Secondary | ICD-10-CM | POA: Insufficient documentation

## 2014-12-23 DIAGNOSIS — Y9301 Activity, walking, marching and hiking: Secondary | ICD-10-CM | POA: Diagnosis not present

## 2014-12-23 DIAGNOSIS — S4991XA Unspecified injury of right shoulder and upper arm, initial encounter: Secondary | ICD-10-CM | POA: Diagnosis present

## 2014-12-23 DIAGNOSIS — M25511 Pain in right shoulder: Secondary | ICD-10-CM | POA: Diagnosis not present

## 2014-12-23 DIAGNOSIS — E119 Type 2 diabetes mellitus without complications: Secondary | ICD-10-CM | POA: Diagnosis not present

## 2014-12-23 DIAGNOSIS — M79601 Pain in right arm: Secondary | ICD-10-CM | POA: Diagnosis not present

## 2014-12-23 MED ORDER — HYDROCODONE-ACETAMINOPHEN 5-325 MG PO TABS: 1.0000 | ORAL_TABLET | ORAL | Status: DC | PRN

## 2014-12-23 MED ORDER — FENTANYL CITRATE 0.05 MG/ML IJ SOLN
25.0000 ug | Freq: Once | INTRAMUSCULAR | Status: AC
  Administered 2014-12-23: 25 ug via INTRAVENOUS
  Filled 2014-12-23: qty 2

## 2014-12-23 NOTE — ED Notes (Signed)
Per ems pt tripped over walker coming inside house. Pt fell on right shoulder. Denies hitting head or LOC. Pt c/o pain in right shoulder. Radial pulses equal bilaterally and strong. Sensation intact.

## 2014-12-23 NOTE — ED Notes (Signed)
Pt transported to radiology.

## 2014-12-23 NOTE — ED Notes (Signed)
Family at bedside. 

## 2014-12-23 NOTE — Progress Notes (Signed)
Orthopedic Tech Progress Note Patient Details:  Jessica [name] November 07, 1926 [id number]  Ortho Devices Ortho Device/Splint Location: sling and swath Ortho Device/Splint Interventions: Ordered, Application   Braulio Bosch 12/23/2014, 8:02 PM

## 2014-12-23 NOTE — ED Provider Notes (Signed)
CSN: [id number]     Arrival date & time 12/23/14  1753 History   First MD Initiated Contact with Patient 12/23/14 1755     No chief complaint on file.  (Consider location/radiation/quality/duration/timing/severity/associated sxs/prior Treatment) HPI Comments: 81 yo F hx of CAD, SSS, Atrial fibrillation on Eliquis, HTN, DMII, presents with CC of fall.  Pt states she was walking with a walker, and accidentally tripped over it.  Denies prodromal symptoms including lightheadedness, dizziness, CP, SOB, or palpitations.  She fell directly on R shoulder onto carpeted floor.  C/o R shoulder pain.  Denies any other pain or injuries.  Denies head trauma or LOC.  Pt states she was unable to get off ground 2/2 R shoulder pain.  No meds prior to arrival.  No other concerns currently.    The history is provided by the patient and the EMS personnel. No language interpreter was used.    Past Medical History  Diagnosis Date  . Hypertension   . Diabetes mellitus     NON INSULIN DEPENDENT  . Hypercholesterolemia   . DJD (degenerative joint disease)   . History of diabetic neuropathy   . Chest pain   . DOE (dyspnea on exertion)   . IHD (ischemic heart disease)   . SSS (sick sinus syndrome)     s/p PPM by JA for SSS and syncope 9/11  . Breast cancer   . GERD (gastroesophageal reflux disease)   . Hemorrhoids   . CAD (coronary artery disease)   . Cholelithiasis 09/12/2013    Lap Chole on 09/14/13   . Paroxysmal atrial fibrillation     discovered on PPM interrogation 12/15, CHAD2VASC score is 6   Past Surgical History  Procedure Laterality Date  . Cardiac catheterization  07/05/2009    EF 55-60%  . Insert / replace / remove pacemaker  9/11    SSS and syncope, implanted by JA (MDT)  . Mastectomy  1992    BILATERAL WITH RECONSTRUCTION  . Colonoscopy    . Ercp N/A 09/13/2013    Procedure: ENDOSCOPIC RETROGRADE CHOLANGIOPANCREATOGRAPHY (ERCP);  Surgeon: [name], MD;  Location: [facility] ENDOSCOPY;   Service: Endoscopy;  Laterality: N/A;  . Cholecystectomy N/A 09/14/2013    Procedure: LAPAROSCOPIC CHOLECYSTECTOMY WITH INTRAOPERATIVE CHOLANGIOGRAM;  Surgeon: [name], MD;  Location: [facility] OR;  Service: General;  Laterality: N/A;   Family History  Problem Relation Age of Onset  . Cancer Mother   . Cancer Father    History  Substance Use Topics  . Smoking status: Never Smoker   . Smokeless tobacco: Not on file  . Alcohol Use: No   OB History    No data available     Review of Systems  Constitutional: Negative for fever and chills.  Respiratory: Negative for cough and shortness of breath.   Cardiovascular: Negative for chest pain, palpitations and leg swelling.  Gastrointestinal: Negative for nausea, vomiting and abdominal pain.  Genitourinary: Negative for dysuria.  Musculoskeletal: Positive for arthralgias. Negative for myalgias, back pain, joint swelling, gait problem and neck pain.  Skin: Negative for wound.  Neurological: Negative for dizziness, weakness, light-headedness, numbness and headaches.  Hematological: Negative for adenopathy. Does not bruise/bleed easily.  All other systems reviewed and are negative.     Allergies  Amlodipine; Lyrica; and Sulfonamide derivatives  Home Medications   Prior to Admission medications   Medication Sig Start Date End Date Taking? Authorizing Provider  apixaban (ELIQUIS) 5 MG TABS tablet Take 1 tablet (5 mg  total) by mouth 2 (two) times daily. 11/23/14   Darlin Coco, MD  aspirin 81 MG tablet Take 81 mg by mouth daily.    Historical Provider, MD  calcium carbonate (OS-CAL) 600 MG TABS tablet Take 600 mg by mouth 2 (two) times daily with a meal.    Historical Provider, MD  carvedilol (COREG) 25 MG tablet Take 1 tablet (25 mg total) by mouth 2 (two) times daily. 11/15/13   Darlin Coco, MD  Cyanocobalamin (VITAMIN B12 PO) Take 1 tablet by mouth daily.     Historical Provider, MD  fish oil-omega-3 fatty acids 1000 MG  capsule Take 1 g by mouth every morning.     Historical Provider, MD  gabapentin (NEURONTIN) 300 MG capsule TAKE ONE CAPSULE BY MOUTH ONCE DAILY 08/29/14   Darlin Coco, MD  hydrALAZINE (APRESOLINE) 10 MG tablet TAKE ONE TABLET BY MOUTH THREE TIMES DAILY 08/14/14   Darlin Coco, MD  hydrochlorothiazide (MICROZIDE) 12.5 MG capsule TAKE ONE CAPSULE BY MOUTH ONCE DAILY 10/18/14   Darlin Coco, MD  lisinopril (PRINIVIL,ZESTRIL) 20 MG tablet TAKE ONE TABLET BY MOUTH TWICE DAILY 08/14/14   Darlin Coco, MD  LORazepam (ATIVAN) 1 MG tablet TAKE ONE TABLET BY MOUTH THREE TIMES DAILY AS NEEDED 05/02/14   Darlin Coco, MD  metFORMIN (GLUCOPHAGE) 500 MG tablet TAKE ONE TABLET BY MOUTH ONCE DAILY WITH  BREAKFAST 07/27/14   Darlin Coco, MD  Multiple Vitamins-Minerals (ECHINACEA ACZ PO) Take by mouth.    Historical Provider, MD  nitroGLYCERIN (NITROSTAT) 0.4 MG SL tablet Place 1 tablet (0.4 mg total) under the tongue every 5 (five) minutes as needed for chest pain. x3 doses as needed for chest pain 08/14/14   Darlin Coco, MD  pyridOXINE (VITAMIN B-6) 100 MG tablet Take 100 mg by mouth daily.    Historical Provider, MD  rosuvastatin (CRESTOR) 10 MG tablet TAKE ONE TABLET BY MOUTH ONCE DAILY IN THE MORNING 08/14/14   Darlin Coco, MD  senna (SENOKOT) 8.6 MG tablet Take 2 tablets by mouth daily.     Historical Provider, MD   There were no vitals taken for this visit. Physical Exam  Constitutional: She is oriented to person, place, and time. She appears well-developed and well-nourished.  HENT:  Head: Normocephalic and atraumatic.  Right Ear: External ear normal.  Left Ear: External ear normal.  Mouth/Throat: Oropharynx is clear and moist.  Eyes: Conjunctivae and EOM are normal. Pupils are equal, round, and reactive to light.  Neck: Normal range of motion. Neck supple.  Cardiovascular: Normal rate, regular rhythm, normal heart sounds and intact distal pulses.   2+ radial pulses  bilaterally.   Pulmonary/Chest: Effort normal and breath sounds normal. No respiratory distress. She has no wheezes. She has no rales. She exhibits tenderness.  Mild R chest wall TTP.   Abdominal: Soft. Bowel sounds are normal. She exhibits no distension and no mass. There is no tenderness. There is no rebound and no guarding.  Musculoskeletal: Normal range of motion. She exhibits tenderness.  TTP R shoulder, proximal humerus.  No palpable deformity, decreased ROM 2/2 pain.    Neurological: She is alert and oriented to person, place, and time.  CN II-XII intact.  No focal motor or sensory deficits on exam; only exception is ROM R shoulder reduced, 2/2 pain per patient.    Skin: Skin is warm and dry.  Nursing note and vitals reviewed.   ED Course  Procedures (including critical care time) Labs Review Labs Reviewed - No data to  display  Imaging Review Dg Shoulder Right  12/23/2014   CLINICAL DATA:  Anterior right shoulder pain after tripping over a walker and falling today on the right side of her body.  EXAM: RIGHT SHOULDER - 2+ VIEW  COMPARISON:  None.  FINDINGS: Transverse fracture of the right humeral neck with 1/2 shaft width of medial displacement of the distal fragment. There is also lateral angulation of the distal fragment. There is no true lateral view for assessment of the AP orientation. Right axillary surgical clips are noted.  IMPRESSION: Displaced and angulated right humeral neck fracture.   Electronically Signed   By: Claudie Revering M.D.   On: 12/23/2014 19:29   Ct Head Wo Contrast  12/23/2014   CLINICAL DATA:  Initial evaluation for head trauma, fell in her house, takes blood thinners  EXAM: CT HEAD WITHOUT CONTRAST  TECHNIQUE: Contiguous axial images were obtained from the base of the skull through the vertex without intravenous contrast.  COMPARISON:  02/10/2013  FINDINGS: Severe diffuse atrophy. Moderate low attenuation in the deep white matter. No evidence of mass or vascular  territory infarct. No hemorrhage or extra-axial fluid. No skull fracture.  IMPRESSION: Chronic involutional change with no acute traumatic injury.   Electronically Signed   By: Skipper Cliche M.D.   On: 12/23/2014 18:57   Dg Chest Portable 1 View  12/23/2014   CLINICAL DATA:  Fall. Hx HTN, diabetes, ischemic heart disease, breast ca, CAD, and paroxysmal afib  EXAM: PORTABLE CHEST - 1 VIEW  COMPARISON:  07/16/2010  FINDINGS: Cardiac silhouette normal in size and configuration. No mediastinal or hilar masses.  Mild thickening of the interstitial markings. No acute lung consolidation or convincing pulmonary edema. No pleural effusion or pneumothorax.  Left anterior chest wall sequential pacemaker is stable with its leads well-positioned.  Changes from right breast surgery are stable.  There is a transverse fracture across the proximal right humeral metaphysis, which is acute.  IMPRESSION: Transverse fracture of the proximal right humerus.  No acute cardiopulmonary disease.   Electronically Signed   By: Lajean Manes M.D.   On: 12/23/2014 18:43   Dg Humerus Right  12/23/2014   CLINICAL DATA:  Initial evaluation for right arm pain, patient fell today  EXAM: RIGHT HUMERUS - 2+ VIEW  COMPARISON:  None.  FINDINGS: There is a fracture of the surgical neck of the humerus with Barrett's angulation. There is mild to moderate displacement of fracture fragments.  IMPRESSION: Fracture of the surgical neck of the humerus.   Electronically Signed   By: Skipper Cliche M.D.   On: 12/23/2014 19:28     EKG Interpretation None      MDM   Final diagnoses:  Proximal humerus fracture, right, closed, initial encounter   81 yo F hx of CAD, SSS, Atrial fibrillation on Eliquis, HTN, DMII, presents with CC of fall. Nature of fall sounds mechanical, unlikely syncopal given no prodromal symptoms, and report that pt clearly tripped over her walker.    Physical exam as above.  Pt hypertensive, but VS otherwise WNL.  C/o R  shoulder pain.  Will obtain R shoulder, R humerus, CXR to eval for acute bony trauma.  Pt also on Eliquis with a fall.  Will obtain head CT as well to r/o bleed.  Fentanyl given for pain.  Will reeval.  6:18 PM  Imaging negative, with exception of shoulder which demonstrates fx neck of R proximal humerus.  Pt neurovascularly intact at this time.  Pt placed  in sling and swath.  Given another dose of fentanyl after XR.    Diagnosis of R proximal humerus fx, NVI.  D/c home at this time.  Sling and swath.  RICE therapy.  Will have pt f/u with orthopedics in 1 week.  Dr Erlinda Hong on call.  Rx Norco.  Pt to stay with family, and family okay taking care of patient during her illness.    Sinda Du  Discussed pt with attending Dr. Aline Brochure.    Sinda Du, MD 12/24/14 [id number]  Pamella Pert, MD 12/24/14 [phone]

## 2014-12-23 NOTE — Discharge Instructions (Signed)
Humerus Fracture, Treated with Immobilization  The humerus is the large bone in your upper arm. You have a broken (fractured) humerus. These fractures are easily diagnosed with X-rays.  TREATMENT   Simple fractures which will heal without disability are treated with simple immobilization. Immobilization means you will wear a cast, splint, or sling. You have a fracture which will do well with immobilization. The fracture will heal well simply by being held in a good position until it is stable enough to begin range of motion exercises. Do not take part in activities which would further injure your arm.   HOME CARE INSTRUCTIONS    Put ice on the injured area.   Put ice in a plastic bag.   Place a towel between your skin and the bag.   Leave the ice on for 15-20 minutes, 03-04 times a day.   If you have a cast:   Do not scratch the skin under the cast using sharp or pointed objects.   Check the skin around the cast every day. You may put lotion on any red or sore areas.   Keep your cast dry and clean.   If you have a splint:   Wear the splint as directed.   Keep your splint dry and clean.   You may loosen the elastic around the splint if your fingers become numb, tingle, or turn cold or blue.   If you have a sling:   Wear the sling as directed.   Do not put pressure on any part of your cast or splint until it is fully hardened.   Your cast or splint can be protected during bathing with a plastic bag. Do not lower the cast or splint into water.   Only take over-the-counter or prescription medicines for pain, discomfort, or fever as directed by your caregiver.   Do range of motion exercises as instructed by your caregiver.   Follow up as directed by your caregiver. This is very important in order to avoid permanent injury or disability and chronic pain.  SEEK IMMEDIATE MEDICAL CARE IF:    Your skin or nails in the injured arm turn blue or gray.   Your arm feels cold or numb.   You develop severe  pain in the injured arm.   You are having problems with the medicines you were given.  MAKE SURE YOU:    Understand these instructions.   Will watch your condition.   Will get help right away if you are not doing well or get worse.  Document Released: 01/26/2001 Document Revised: 01/12/2012 Document Reviewed: 12/04/2010  [employer] Patient Information 2015 [employer]. This information is not intended to replace advice given to you by your health care provider. Make sure you discuss any questions you have with your health care provider.

## 2014-12-25 ENCOUNTER — Other Ambulatory Visit: Payer: Self-pay | Admitting: Cardiology

## 2014-12-29 ENCOUNTER — Other Ambulatory Visit: Payer: Self-pay | Admitting: Orthopaedic Surgery

## 2014-12-29 ENCOUNTER — Ambulatory Visit
Admission: RE | Admit: 2014-12-29 | Discharge: 2014-12-29 | Disposition: A | Discharge status: Home / Self Care | Payer: Medicare Other | Source: Ambulatory Visit | Attending: Orthopaedic Surgery | Admitting: Orthopaedic Surgery

## 2014-12-29 DIAGNOSIS — S4292XG Fracture of left shoulder girdle, part unspecified, subsequent encounter for fracture with delayed healing: Secondary | ICD-10-CM

## 2014-12-29 DIAGNOSIS — M25511 Pain in right shoulder: Secondary | ICD-10-CM

## 2014-12-29 DIAGNOSIS — S42291D Other displaced fracture of upper end of right humerus, subsequent encounter for fracture with routine healing: Secondary | ICD-10-CM | POA: Diagnosis not present

## 2014-12-29 DIAGNOSIS — S42291A Other displaced fracture of upper end of right humerus, initial encounter for closed fracture: Secondary | ICD-10-CM | POA: Diagnosis not present

## 2015-01-05 DIAGNOSIS — S42291A Other displaced fracture of upper end of right humerus, initial encounter for closed fracture: Secondary | ICD-10-CM | POA: Diagnosis not present

## 2015-01-05 DIAGNOSIS — S42291D Other displaced fracture of upper end of right humerus, subsequent encounter for fracture with routine healing: Secondary | ICD-10-CM | POA: Diagnosis not present

## 2015-01-11 DIAGNOSIS — S42291D Other displaced fracture of upper end of right humerus, subsequent encounter for fracture with routine healing: Secondary | ICD-10-CM | POA: Diagnosis not present

## 2015-01-15 DIAGNOSIS — M1990 Unspecified osteoarthritis, unspecified site: Secondary | ICD-10-CM | POA: Diagnosis not present

## 2015-01-15 DIAGNOSIS — M810 Age-related osteoporosis without current pathological fracture: Secondary | ICD-10-CM | POA: Diagnosis not present

## 2015-01-15 DIAGNOSIS — E119 Type 2 diabetes mellitus without complications: Secondary | ICD-10-CM | POA: Diagnosis not present

## 2015-01-15 DIAGNOSIS — S42211D Unspecified displaced fracture of surgical neck of right humerus, subsequent encounter for fracture with routine healing: Secondary | ICD-10-CM | POA: Diagnosis not present

## 2015-01-15 DIAGNOSIS — I10 Essential (primary) hypertension: Secondary | ICD-10-CM | POA: Diagnosis not present

## 2015-01-15 DIAGNOSIS — R531 Weakness: Secondary | ICD-10-CM | POA: Diagnosis not present

## 2015-01-16 DIAGNOSIS — R531 Weakness: Secondary | ICD-10-CM | POA: Diagnosis not present

## 2015-01-16 DIAGNOSIS — S42211D Unspecified displaced fracture of surgical neck of right humerus, subsequent encounter for fracture with routine healing: Secondary | ICD-10-CM | POA: Diagnosis not present

## 2015-01-16 DIAGNOSIS — E119 Type 2 diabetes mellitus without complications: Secondary | ICD-10-CM | POA: Diagnosis not present

## 2015-01-16 DIAGNOSIS — I10 Essential (primary) hypertension: Secondary | ICD-10-CM | POA: Diagnosis not present

## 2015-01-16 DIAGNOSIS — M1990 Unspecified osteoarthritis, unspecified site: Secondary | ICD-10-CM | POA: Diagnosis not present

## 2015-01-16 DIAGNOSIS — M810 Age-related osteoporosis without current pathological fracture: Secondary | ICD-10-CM | POA: Diagnosis not present

## 2015-01-17 DIAGNOSIS — S42211D Unspecified displaced fracture of surgical neck of right humerus, subsequent encounter for fracture with routine healing: Secondary | ICD-10-CM | POA: Diagnosis not present

## 2015-01-17 DIAGNOSIS — M1990 Unspecified osteoarthritis, unspecified site: Secondary | ICD-10-CM | POA: Diagnosis not present

## 2015-01-17 DIAGNOSIS — I10 Essential (primary) hypertension: Secondary | ICD-10-CM | POA: Diagnosis not present

## 2015-01-17 DIAGNOSIS — M810 Age-related osteoporosis without current pathological fracture: Secondary | ICD-10-CM | POA: Diagnosis not present

## 2015-01-17 DIAGNOSIS — R531 Weakness: Secondary | ICD-10-CM | POA: Diagnosis not present

## 2015-01-17 DIAGNOSIS — E119 Type 2 diabetes mellitus without complications: Secondary | ICD-10-CM | POA: Diagnosis not present

## 2015-01-18 DIAGNOSIS — M810 Age-related osteoporosis without current pathological fracture: Secondary | ICD-10-CM | POA: Diagnosis not present

## 2015-01-18 DIAGNOSIS — S42211D Unspecified displaced fracture of surgical neck of right humerus, subsequent encounter for fracture with routine healing: Secondary | ICD-10-CM | POA: Diagnosis not present

## 2015-01-18 DIAGNOSIS — E119 Type 2 diabetes mellitus without complications: Secondary | ICD-10-CM | POA: Diagnosis not present

## 2015-01-18 DIAGNOSIS — R531 Weakness: Secondary | ICD-10-CM | POA: Diagnosis not present

## 2015-01-18 DIAGNOSIS — M1990 Unspecified osteoarthritis, unspecified site: Secondary | ICD-10-CM | POA: Diagnosis not present

## 2015-01-18 DIAGNOSIS — I10 Essential (primary) hypertension: Secondary | ICD-10-CM | POA: Diagnosis not present

## 2015-01-19 ENCOUNTER — Other Ambulatory Visit: Payer: Self-pay | Admitting: Cardiology

## 2015-01-19 DIAGNOSIS — M1990 Unspecified osteoarthritis, unspecified site: Secondary | ICD-10-CM | POA: Diagnosis not present

## 2015-01-19 DIAGNOSIS — M810 Age-related osteoporosis without current pathological fracture: Secondary | ICD-10-CM | POA: Diagnosis not present

## 2015-01-19 DIAGNOSIS — E119 Type 2 diabetes mellitus without complications: Secondary | ICD-10-CM | POA: Diagnosis not present

## 2015-01-19 DIAGNOSIS — I10 Essential (primary) hypertension: Secondary | ICD-10-CM | POA: Diagnosis not present

## 2015-01-19 DIAGNOSIS — S42211D Unspecified displaced fracture of surgical neck of right humerus, subsequent encounter for fracture with routine healing: Secondary | ICD-10-CM | POA: Diagnosis not present

## 2015-01-19 DIAGNOSIS — R531 Weakness: Secondary | ICD-10-CM | POA: Diagnosis not present

## 2015-01-22 DIAGNOSIS — M810 Age-related osteoporosis without current pathological fracture: Secondary | ICD-10-CM | POA: Diagnosis not present

## 2015-01-22 DIAGNOSIS — I10 Essential (primary) hypertension: Secondary | ICD-10-CM | POA: Diagnosis not present

## 2015-01-22 DIAGNOSIS — M1990 Unspecified osteoarthritis, unspecified site: Secondary | ICD-10-CM | POA: Diagnosis not present

## 2015-01-22 DIAGNOSIS — R531 Weakness: Secondary | ICD-10-CM | POA: Diagnosis not present

## 2015-01-22 DIAGNOSIS — S42211D Unspecified displaced fracture of surgical neck of right humerus, subsequent encounter for fracture with routine healing: Secondary | ICD-10-CM | POA: Diagnosis not present

## 2015-01-22 DIAGNOSIS — E119 Type 2 diabetes mellitus without complications: Secondary | ICD-10-CM | POA: Diagnosis not present

## 2015-01-24 ENCOUNTER — Ambulatory Visit (INDEPENDENT_AMBULATORY_CARE_PROVIDER_SITE_OTHER): Payer: Medicare Other | Admitting: *Deleted

## 2015-01-24 ENCOUNTER — Telehealth: Payer: Self-pay | Admitting: Cardiology

## 2015-01-24 DIAGNOSIS — M1990 Unspecified osteoarthritis, unspecified site: Secondary | ICD-10-CM | POA: Diagnosis not present

## 2015-01-24 DIAGNOSIS — I495 Sick sinus syndrome: Secondary | ICD-10-CM | POA: Diagnosis not present

## 2015-01-24 DIAGNOSIS — S42211D Unspecified displaced fracture of surgical neck of right humerus, subsequent encounter for fracture with routine healing: Secondary | ICD-10-CM | POA: Diagnosis not present

## 2015-01-24 DIAGNOSIS — E119 Type 2 diabetes mellitus without complications: Secondary | ICD-10-CM | POA: Diagnosis not present

## 2015-01-24 DIAGNOSIS — M810 Age-related osteoporosis without current pathological fracture: Secondary | ICD-10-CM | POA: Diagnosis not present

## 2015-01-24 DIAGNOSIS — I10 Essential (primary) hypertension: Secondary | ICD-10-CM | POA: Diagnosis not present

## 2015-01-24 DIAGNOSIS — R531 Weakness: Secondary | ICD-10-CM | POA: Diagnosis not present

## 2015-01-24 LAB — MDC_IDC_ENUM_SESS_TYPE_REMOTE
Battery Impedance: 224 Ohm
Battery Remaining Longevity: 116 mo
Battery Voltage: 2.79 V
Brady Statistic AP VP Percent: 28 %
Brady Statistic AS VP Percent: 0 %
Brady Statistic AS VS Percent: 0 %
Date Time Interrogation Session: 20160323141902
Lead Channel Pacing Threshold Amplitude: 0.625 V
Lead Channel Pacing Threshold Pulse Width: 0.4 ms
Lead Channel Pacing Threshold Pulse Width: 0.4 ms
Lead Channel Sensing Intrinsic Amplitude: 5.6 mV
Lead Channel Setting Pacing Amplitude: 2 V
Lead Channel Setting Pacing Amplitude: 2.5 V
MDC IDC MSMT LEADCHNL RA IMPEDANCE VALUE: 564 Ohm
MDC IDC MSMT LEADCHNL RV IMPEDANCE VALUE: 656 Ohm
MDC IDC MSMT LEADCHNL RV PACING THRESHOLD AMPLITUDE: 0.5 V
MDC IDC SET LEADCHNL RV PACING PULSEWIDTH: 0.4 ms
MDC IDC SET LEADCHNL RV SENSING SENSITIVITY: 2 mV
MDC IDC STAT BRADY AP VS PERCENT: 72 %

## 2015-01-24 NOTE — Telephone Encounter (Signed)
LMOVM reminding pt to send remote transmission.   

## 2015-01-24 NOTE — Progress Notes (Signed)
Remote pacemaker transmission.   

## 2015-01-25 DIAGNOSIS — I10 Essential (primary) hypertension: Secondary | ICD-10-CM | POA: Diagnosis not present

## 2015-01-25 DIAGNOSIS — M810 Age-related osteoporosis without current pathological fracture: Secondary | ICD-10-CM | POA: Diagnosis not present

## 2015-01-25 DIAGNOSIS — E119 Type 2 diabetes mellitus without complications: Secondary | ICD-10-CM | POA: Diagnosis not present

## 2015-01-25 DIAGNOSIS — M1990 Unspecified osteoarthritis, unspecified site: Secondary | ICD-10-CM | POA: Diagnosis not present

## 2015-01-25 DIAGNOSIS — R531 Weakness: Secondary | ICD-10-CM | POA: Diagnosis not present

## 2015-01-25 DIAGNOSIS — S42211D Unspecified displaced fracture of surgical neck of right humerus, subsequent encounter for fracture with routine healing: Secondary | ICD-10-CM | POA: Diagnosis not present

## 2015-01-29 DIAGNOSIS — R531 Weakness: Secondary | ICD-10-CM | POA: Diagnosis not present

## 2015-01-29 DIAGNOSIS — E119 Type 2 diabetes mellitus without complications: Secondary | ICD-10-CM | POA: Diagnosis not present

## 2015-01-29 DIAGNOSIS — S42211D Unspecified displaced fracture of surgical neck of right humerus, subsequent encounter for fracture with routine healing: Secondary | ICD-10-CM | POA: Diagnosis not present

## 2015-01-29 DIAGNOSIS — M810 Age-related osteoporosis without current pathological fracture: Secondary | ICD-10-CM | POA: Diagnosis not present

## 2015-01-29 DIAGNOSIS — M1990 Unspecified osteoarthritis, unspecified site: Secondary | ICD-10-CM | POA: Diagnosis not present

## 2015-01-29 DIAGNOSIS — I10 Essential (primary) hypertension: Secondary | ICD-10-CM | POA: Diagnosis not present

## 2015-01-31 DIAGNOSIS — M1990 Unspecified osteoarthritis, unspecified site: Secondary | ICD-10-CM | POA: Diagnosis not present

## 2015-01-31 DIAGNOSIS — E119 Type 2 diabetes mellitus without complications: Secondary | ICD-10-CM | POA: Diagnosis not present

## 2015-01-31 DIAGNOSIS — S42211D Unspecified displaced fracture of surgical neck of right humerus, subsequent encounter for fracture with routine healing: Secondary | ICD-10-CM | POA: Diagnosis not present

## 2015-01-31 DIAGNOSIS — R531 Weakness: Secondary | ICD-10-CM | POA: Diagnosis not present

## 2015-01-31 DIAGNOSIS — I10 Essential (primary) hypertension: Secondary | ICD-10-CM | POA: Diagnosis not present

## 2015-01-31 DIAGNOSIS — M810 Age-related osteoporosis without current pathological fracture: Secondary | ICD-10-CM | POA: Diagnosis not present

## 2015-02-01 DIAGNOSIS — S42291D Other displaced fracture of upper end of right humerus, subsequent encounter for fracture with routine healing: Secondary | ICD-10-CM | POA: Diagnosis not present

## 2015-02-05 DIAGNOSIS — M810 Age-related osteoporosis without current pathological fracture: Secondary | ICD-10-CM | POA: Diagnosis not present

## 2015-02-05 DIAGNOSIS — M1990 Unspecified osteoarthritis, unspecified site: Secondary | ICD-10-CM | POA: Diagnosis not present

## 2015-02-05 DIAGNOSIS — E119 Type 2 diabetes mellitus without complications: Secondary | ICD-10-CM | POA: Diagnosis not present

## 2015-02-05 DIAGNOSIS — S42211D Unspecified displaced fracture of surgical neck of right humerus, subsequent encounter for fracture with routine healing: Secondary | ICD-10-CM | POA: Diagnosis not present

## 2015-02-05 DIAGNOSIS — R531 Weakness: Secondary | ICD-10-CM | POA: Diagnosis not present

## 2015-02-05 DIAGNOSIS — I10 Essential (primary) hypertension: Secondary | ICD-10-CM | POA: Diagnosis not present

## 2015-02-06 DIAGNOSIS — M810 Age-related osteoporosis without current pathological fracture: Secondary | ICD-10-CM | POA: Diagnosis not present

## 2015-02-06 DIAGNOSIS — E119 Type 2 diabetes mellitus without complications: Secondary | ICD-10-CM | POA: Diagnosis not present

## 2015-02-06 DIAGNOSIS — M1990 Unspecified osteoarthritis, unspecified site: Secondary | ICD-10-CM | POA: Diagnosis not present

## 2015-02-06 DIAGNOSIS — I10 Essential (primary) hypertension: Secondary | ICD-10-CM | POA: Diagnosis not present

## 2015-02-06 DIAGNOSIS — S42211D Unspecified displaced fracture of surgical neck of right humerus, subsequent encounter for fracture with routine healing: Secondary | ICD-10-CM | POA: Diagnosis not present

## 2015-02-06 DIAGNOSIS — R531 Weakness: Secondary | ICD-10-CM | POA: Diagnosis not present

## 2015-02-07 DIAGNOSIS — M810 Age-related osteoporosis without current pathological fracture: Secondary | ICD-10-CM | POA: Diagnosis not present

## 2015-02-07 DIAGNOSIS — M1990 Unspecified osteoarthritis, unspecified site: Secondary | ICD-10-CM | POA: Diagnosis not present

## 2015-02-07 DIAGNOSIS — R531 Weakness: Secondary | ICD-10-CM | POA: Diagnosis not present

## 2015-02-07 DIAGNOSIS — E119 Type 2 diabetes mellitus without complications: Secondary | ICD-10-CM | POA: Diagnosis not present

## 2015-02-07 DIAGNOSIS — I10 Essential (primary) hypertension: Secondary | ICD-10-CM | POA: Diagnosis not present

## 2015-02-07 DIAGNOSIS — S42211D Unspecified displaced fracture of surgical neck of right humerus, subsequent encounter for fracture with routine healing: Secondary | ICD-10-CM | POA: Diagnosis not present

## 2015-02-08 ENCOUNTER — Encounter: Payer: Self-pay | Admitting: Cardiology

## 2015-02-08 DIAGNOSIS — M810 Age-related osteoporosis without current pathological fracture: Secondary | ICD-10-CM | POA: Diagnosis not present

## 2015-02-08 DIAGNOSIS — S42211D Unspecified displaced fracture of surgical neck of right humerus, subsequent encounter for fracture with routine healing: Secondary | ICD-10-CM | POA: Diagnosis not present

## 2015-02-08 DIAGNOSIS — E119 Type 2 diabetes mellitus without complications: Secondary | ICD-10-CM | POA: Diagnosis not present

## 2015-02-08 DIAGNOSIS — I10 Essential (primary) hypertension: Secondary | ICD-10-CM | POA: Diagnosis not present

## 2015-02-08 DIAGNOSIS — M1990 Unspecified osteoarthritis, unspecified site: Secondary | ICD-10-CM | POA: Diagnosis not present

## 2015-02-08 DIAGNOSIS — R531 Weakness: Secondary | ICD-10-CM | POA: Diagnosis not present

## 2015-02-12 ENCOUNTER — Encounter: Payer: Self-pay | Admitting: Internal Medicine

## 2015-02-12 DIAGNOSIS — S42211D Unspecified displaced fracture of surgical neck of right humerus, subsequent encounter for fracture with routine healing: Secondary | ICD-10-CM | POA: Diagnosis not present

## 2015-02-12 DIAGNOSIS — M1990 Unspecified osteoarthritis, unspecified site: Secondary | ICD-10-CM | POA: Diagnosis not present

## 2015-02-12 DIAGNOSIS — M810 Age-related osteoporosis without current pathological fracture: Secondary | ICD-10-CM | POA: Diagnosis not present

## 2015-02-12 DIAGNOSIS — R531 Weakness: Secondary | ICD-10-CM | POA: Diagnosis not present

## 2015-02-12 DIAGNOSIS — E119 Type 2 diabetes mellitus without complications: Secondary | ICD-10-CM | POA: Diagnosis not present

## 2015-02-12 DIAGNOSIS — I10 Essential (primary) hypertension: Secondary | ICD-10-CM | POA: Diagnosis not present

## 2015-02-14 DIAGNOSIS — E119 Type 2 diabetes mellitus without complications: Secondary | ICD-10-CM | POA: Diagnosis not present

## 2015-02-14 DIAGNOSIS — R531 Weakness: Secondary | ICD-10-CM | POA: Diagnosis not present

## 2015-02-14 DIAGNOSIS — S42211D Unspecified displaced fracture of surgical neck of right humerus, subsequent encounter for fracture with routine healing: Secondary | ICD-10-CM | POA: Diagnosis not present

## 2015-02-14 DIAGNOSIS — I10 Essential (primary) hypertension: Secondary | ICD-10-CM | POA: Diagnosis not present

## 2015-02-14 DIAGNOSIS — M810 Age-related osteoporosis without current pathological fracture: Secondary | ICD-10-CM | POA: Diagnosis not present

## 2015-02-14 DIAGNOSIS — M1990 Unspecified osteoarthritis, unspecified site: Secondary | ICD-10-CM | POA: Diagnosis not present

## 2015-02-15 DIAGNOSIS — S42211D Unspecified displaced fracture of surgical neck of right humerus, subsequent encounter for fracture with routine healing: Secondary | ICD-10-CM | POA: Diagnosis not present

## 2015-02-15 DIAGNOSIS — M810 Age-related osteoporosis without current pathological fracture: Secondary | ICD-10-CM | POA: Diagnosis not present

## 2015-02-15 DIAGNOSIS — I10 Essential (primary) hypertension: Secondary | ICD-10-CM | POA: Diagnosis not present

## 2015-02-15 DIAGNOSIS — E119 Type 2 diabetes mellitus without complications: Secondary | ICD-10-CM | POA: Diagnosis not present

## 2015-02-15 DIAGNOSIS — R531 Weakness: Secondary | ICD-10-CM | POA: Diagnosis not present

## 2015-02-15 DIAGNOSIS — M1990 Unspecified osteoarthritis, unspecified site: Secondary | ICD-10-CM | POA: Diagnosis not present

## 2015-02-19 DIAGNOSIS — S42211D Unspecified displaced fracture of surgical neck of right humerus, subsequent encounter for fracture with routine healing: Secondary | ICD-10-CM | POA: Diagnosis not present

## 2015-02-19 DIAGNOSIS — R531 Weakness: Secondary | ICD-10-CM | POA: Diagnosis not present

## 2015-02-19 DIAGNOSIS — M810 Age-related osteoporosis without current pathological fracture: Secondary | ICD-10-CM | POA: Diagnosis not present

## 2015-02-19 DIAGNOSIS — M1990 Unspecified osteoarthritis, unspecified site: Secondary | ICD-10-CM | POA: Diagnosis not present

## 2015-02-19 DIAGNOSIS — I10 Essential (primary) hypertension: Secondary | ICD-10-CM | POA: Diagnosis not present

## 2015-02-19 DIAGNOSIS — E119 Type 2 diabetes mellitus without complications: Secondary | ICD-10-CM | POA: Diagnosis not present

## 2015-02-20 DIAGNOSIS — E119 Type 2 diabetes mellitus without complications: Secondary | ICD-10-CM | POA: Diagnosis not present

## 2015-02-20 DIAGNOSIS — S42211D Unspecified displaced fracture of surgical neck of right humerus, subsequent encounter for fracture with routine healing: Secondary | ICD-10-CM | POA: Diagnosis not present

## 2015-02-20 DIAGNOSIS — M810 Age-related osteoporosis without current pathological fracture: Secondary | ICD-10-CM | POA: Diagnosis not present

## 2015-02-20 DIAGNOSIS — I10 Essential (primary) hypertension: Secondary | ICD-10-CM | POA: Diagnosis not present

## 2015-02-20 DIAGNOSIS — M1990 Unspecified osteoarthritis, unspecified site: Secondary | ICD-10-CM | POA: Diagnosis not present

## 2015-02-20 DIAGNOSIS — R531 Weakness: Secondary | ICD-10-CM | POA: Diagnosis not present

## 2015-02-21 DIAGNOSIS — S42211D Unspecified displaced fracture of surgical neck of right humerus, subsequent encounter for fracture with routine healing: Secondary | ICD-10-CM | POA: Diagnosis not present

## 2015-02-21 DIAGNOSIS — M810 Age-related osteoporosis without current pathological fracture: Secondary | ICD-10-CM | POA: Diagnosis not present

## 2015-02-21 DIAGNOSIS — I10 Essential (primary) hypertension: Secondary | ICD-10-CM | POA: Diagnosis not present

## 2015-02-21 DIAGNOSIS — M1990 Unspecified osteoarthritis, unspecified site: Secondary | ICD-10-CM | POA: Diagnosis not present

## 2015-02-21 DIAGNOSIS — E119 Type 2 diabetes mellitus without complications: Secondary | ICD-10-CM | POA: Diagnosis not present

## 2015-02-21 DIAGNOSIS — R531 Weakness: Secondary | ICD-10-CM | POA: Diagnosis not present

## 2015-02-22 DIAGNOSIS — M810 Age-related osteoporosis without current pathological fracture: Secondary | ICD-10-CM | POA: Diagnosis not present

## 2015-02-22 DIAGNOSIS — R531 Weakness: Secondary | ICD-10-CM | POA: Diagnosis not present

## 2015-02-22 DIAGNOSIS — S42211D Unspecified displaced fracture of surgical neck of right humerus, subsequent encounter for fracture with routine healing: Secondary | ICD-10-CM | POA: Diagnosis not present

## 2015-02-22 DIAGNOSIS — E119 Type 2 diabetes mellitus without complications: Secondary | ICD-10-CM | POA: Diagnosis not present

## 2015-02-22 DIAGNOSIS — M1990 Unspecified osteoarthritis, unspecified site: Secondary | ICD-10-CM | POA: Diagnosis not present

## 2015-02-22 DIAGNOSIS — I10 Essential (primary) hypertension: Secondary | ICD-10-CM | POA: Diagnosis not present

## 2015-02-28 DIAGNOSIS — M810 Age-related osteoporosis without current pathological fracture: Secondary | ICD-10-CM | POA: Diagnosis not present

## 2015-02-28 DIAGNOSIS — S42211D Unspecified displaced fracture of surgical neck of right humerus, subsequent encounter for fracture with routine healing: Secondary | ICD-10-CM | POA: Diagnosis not present

## 2015-02-28 DIAGNOSIS — M1990 Unspecified osteoarthritis, unspecified site: Secondary | ICD-10-CM | POA: Diagnosis not present

## 2015-02-28 DIAGNOSIS — R531 Weakness: Secondary | ICD-10-CM | POA: Diagnosis not present

## 2015-02-28 DIAGNOSIS — I10 Essential (primary) hypertension: Secondary | ICD-10-CM | POA: Diagnosis not present

## 2015-02-28 DIAGNOSIS — E119 Type 2 diabetes mellitus without complications: Secondary | ICD-10-CM | POA: Diagnosis not present

## 2015-03-01 DIAGNOSIS — M810 Age-related osteoporosis without current pathological fracture: Secondary | ICD-10-CM | POA: Diagnosis not present

## 2015-03-01 DIAGNOSIS — M1990 Unspecified osteoarthritis, unspecified site: Secondary | ICD-10-CM | POA: Diagnosis not present

## 2015-03-01 DIAGNOSIS — R531 Weakness: Secondary | ICD-10-CM | POA: Diagnosis not present

## 2015-03-01 DIAGNOSIS — S42211D Unspecified displaced fracture of surgical neck of right humerus, subsequent encounter for fracture with routine healing: Secondary | ICD-10-CM | POA: Diagnosis not present

## 2015-03-01 DIAGNOSIS — E119 Type 2 diabetes mellitus without complications: Secondary | ICD-10-CM | POA: Diagnosis not present

## 2015-03-01 DIAGNOSIS — I10 Essential (primary) hypertension: Secondary | ICD-10-CM | POA: Diagnosis not present

## 2015-03-05 DIAGNOSIS — M1990 Unspecified osteoarthritis, unspecified site: Secondary | ICD-10-CM | POA: Diagnosis not present

## 2015-03-05 DIAGNOSIS — I10 Essential (primary) hypertension: Secondary | ICD-10-CM | POA: Diagnosis not present

## 2015-03-05 DIAGNOSIS — M810 Age-related osteoporosis without current pathological fracture: Secondary | ICD-10-CM | POA: Diagnosis not present

## 2015-03-05 DIAGNOSIS — E119 Type 2 diabetes mellitus without complications: Secondary | ICD-10-CM | POA: Diagnosis not present

## 2015-03-05 DIAGNOSIS — S42211D Unspecified displaced fracture of surgical neck of right humerus, subsequent encounter for fracture with routine healing: Secondary | ICD-10-CM | POA: Diagnosis not present

## 2015-03-05 DIAGNOSIS — R531 Weakness: Secondary | ICD-10-CM | POA: Diagnosis not present

## 2015-03-06 DIAGNOSIS — S42211D Unspecified displaced fracture of surgical neck of right humerus, subsequent encounter for fracture with routine healing: Secondary | ICD-10-CM | POA: Diagnosis not present

## 2015-03-06 DIAGNOSIS — M810 Age-related osteoporosis without current pathological fracture: Secondary | ICD-10-CM | POA: Diagnosis not present

## 2015-03-06 DIAGNOSIS — I10 Essential (primary) hypertension: Secondary | ICD-10-CM | POA: Diagnosis not present

## 2015-03-06 DIAGNOSIS — R531 Weakness: Secondary | ICD-10-CM | POA: Diagnosis not present

## 2015-03-06 DIAGNOSIS — E119 Type 2 diabetes mellitus without complications: Secondary | ICD-10-CM | POA: Diagnosis not present

## 2015-03-06 DIAGNOSIS — M1990 Unspecified osteoarthritis, unspecified site: Secondary | ICD-10-CM | POA: Diagnosis not present

## 2015-03-07 DIAGNOSIS — E119 Type 2 diabetes mellitus without complications: Secondary | ICD-10-CM | POA: Diagnosis not present

## 2015-03-07 DIAGNOSIS — I10 Essential (primary) hypertension: Secondary | ICD-10-CM | POA: Diagnosis not present

## 2015-03-07 DIAGNOSIS — S42211D Unspecified displaced fracture of surgical neck of right humerus, subsequent encounter for fracture with routine healing: Secondary | ICD-10-CM | POA: Diagnosis not present

## 2015-03-07 DIAGNOSIS — M1990 Unspecified osteoarthritis, unspecified site: Secondary | ICD-10-CM | POA: Diagnosis not present

## 2015-03-07 DIAGNOSIS — M810 Age-related osteoporosis without current pathological fracture: Secondary | ICD-10-CM | POA: Diagnosis not present

## 2015-03-07 DIAGNOSIS — R531 Weakness: Secondary | ICD-10-CM | POA: Diagnosis not present

## 2015-03-08 DIAGNOSIS — R531 Weakness: Secondary | ICD-10-CM | POA: Diagnosis not present

## 2015-03-08 DIAGNOSIS — M810 Age-related osteoporosis without current pathological fracture: Secondary | ICD-10-CM | POA: Diagnosis not present

## 2015-03-08 DIAGNOSIS — I10 Essential (primary) hypertension: Secondary | ICD-10-CM | POA: Diagnosis not present

## 2015-03-08 DIAGNOSIS — S42211D Unspecified displaced fracture of surgical neck of right humerus, subsequent encounter for fracture with routine healing: Secondary | ICD-10-CM | POA: Diagnosis not present

## 2015-03-08 DIAGNOSIS — M1990 Unspecified osteoarthritis, unspecified site: Secondary | ICD-10-CM | POA: Diagnosis not present

## 2015-03-08 DIAGNOSIS — E119 Type 2 diabetes mellitus without complications: Secondary | ICD-10-CM | POA: Diagnosis not present

## 2015-03-12 DIAGNOSIS — E119 Type 2 diabetes mellitus without complications: Secondary | ICD-10-CM | POA: Diagnosis not present

## 2015-03-12 DIAGNOSIS — I10 Essential (primary) hypertension: Secondary | ICD-10-CM | POA: Diagnosis not present

## 2015-03-12 DIAGNOSIS — M1990 Unspecified osteoarthritis, unspecified site: Secondary | ICD-10-CM | POA: Diagnosis not present

## 2015-03-12 DIAGNOSIS — R531 Weakness: Secondary | ICD-10-CM | POA: Diagnosis not present

## 2015-03-12 DIAGNOSIS — S42211D Unspecified displaced fracture of surgical neck of right humerus, subsequent encounter for fracture with routine healing: Secondary | ICD-10-CM | POA: Diagnosis not present

## 2015-03-12 DIAGNOSIS — M810 Age-related osteoporosis without current pathological fracture: Secondary | ICD-10-CM | POA: Diagnosis not present

## 2015-03-13 DIAGNOSIS — E119 Type 2 diabetes mellitus without complications: Secondary | ICD-10-CM | POA: Diagnosis not present

## 2015-03-13 DIAGNOSIS — M810 Age-related osteoporosis without current pathological fracture: Secondary | ICD-10-CM | POA: Diagnosis not present

## 2015-03-13 DIAGNOSIS — I10 Essential (primary) hypertension: Secondary | ICD-10-CM | POA: Diagnosis not present

## 2015-03-13 DIAGNOSIS — S42211D Unspecified displaced fracture of surgical neck of right humerus, subsequent encounter for fracture with routine healing: Secondary | ICD-10-CM | POA: Diagnosis not present

## 2015-03-13 DIAGNOSIS — R531 Weakness: Secondary | ICD-10-CM | POA: Diagnosis not present

## 2015-03-13 DIAGNOSIS — M1990 Unspecified osteoarthritis, unspecified site: Secondary | ICD-10-CM | POA: Diagnosis not present

## 2015-03-14 DIAGNOSIS — R531 Weakness: Secondary | ICD-10-CM | POA: Diagnosis not present

## 2015-03-14 DIAGNOSIS — I10 Essential (primary) hypertension: Secondary | ICD-10-CM | POA: Diagnosis not present

## 2015-03-14 DIAGNOSIS — M810 Age-related osteoporosis without current pathological fracture: Secondary | ICD-10-CM | POA: Diagnosis not present

## 2015-03-14 DIAGNOSIS — M1990 Unspecified osteoarthritis, unspecified site: Secondary | ICD-10-CM | POA: Diagnosis not present

## 2015-03-14 DIAGNOSIS — E119 Type 2 diabetes mellitus without complications: Secondary | ICD-10-CM | POA: Diagnosis not present

## 2015-03-14 DIAGNOSIS — S42211D Unspecified displaced fracture of surgical neck of right humerus, subsequent encounter for fracture with routine healing: Secondary | ICD-10-CM | POA: Diagnosis not present

## 2015-03-15 ENCOUNTER — Other Ambulatory Visit: Payer: Self-pay | Admitting: Cardiology

## 2015-03-15 ENCOUNTER — Ambulatory Visit: Payer: Medicare Other

## 2015-03-15 DIAGNOSIS — R531 Weakness: Secondary | ICD-10-CM | POA: Diagnosis not present

## 2015-03-15 DIAGNOSIS — M1990 Unspecified osteoarthritis, unspecified site: Secondary | ICD-10-CM | POA: Diagnosis not present

## 2015-03-15 DIAGNOSIS — M810 Age-related osteoporosis without current pathological fracture: Secondary | ICD-10-CM | POA: Diagnosis not present

## 2015-03-15 DIAGNOSIS — E119 Type 2 diabetes mellitus without complications: Secondary | ICD-10-CM | POA: Diagnosis not present

## 2015-03-15 DIAGNOSIS — I10 Essential (primary) hypertension: Secondary | ICD-10-CM | POA: Diagnosis not present

## 2015-03-15 DIAGNOSIS — E1140 Type 2 diabetes mellitus with diabetic neuropathy, unspecified: Secondary | ICD-10-CM

## 2015-03-15 DIAGNOSIS — B351 Tinea unguium: Secondary | ICD-10-CM

## 2015-03-15 DIAGNOSIS — S42211D Unspecified displaced fracture of surgical neck of right humerus, subsequent encounter for fracture with routine healing: Secondary | ICD-10-CM | POA: Diagnosis not present

## 2015-03-15 DIAGNOSIS — M79676 Pain in unspecified toe(s): Secondary | ICD-10-CM

## 2015-03-29 DIAGNOSIS — S42291D Other displaced fracture of upper end of right humerus, subsequent encounter for fracture with routine healing: Secondary | ICD-10-CM | POA: Diagnosis not present

## 2015-04-09 ENCOUNTER — Telehealth: Payer: Self-pay | Admitting: *Deleted

## 2015-04-09 NOTE — Telephone Encounter (Addendum)
Pt called request information about making and appt for her toe.  Left message informing pt if she was ready to schedule for the toenail procedure she could call our office and press the option for the appt line and schedule for her convenience.

## 2015-04-16 ENCOUNTER — Other Ambulatory Visit: Payer: Self-pay | Admitting: Cardiology

## 2015-04-19 ENCOUNTER — Ambulatory Visit (INDEPENDENT_AMBULATORY_CARE_PROVIDER_SITE_OTHER): Payer: Medicare Other | Admitting: Podiatry

## 2015-04-19 ENCOUNTER — Encounter: Payer: Self-pay | Admitting: Podiatry

## 2015-04-19 VITALS — BP 151/90 | HR 87 | Resp 14

## 2015-04-19 DIAGNOSIS — E1140 Type 2 diabetes mellitus with diabetic neuropathy, unspecified: Secondary | ICD-10-CM

## 2015-04-19 DIAGNOSIS — B351 Tinea unguium: Secondary | ICD-10-CM

## 2015-04-19 DIAGNOSIS — M79676 Pain in unspecified toe(s): Secondary | ICD-10-CM

## 2015-04-19 NOTE — Progress Notes (Signed)
Subjective:     Patient ID: Jessica [name], female   DOB: 04-26-1927, 81 y.o.   MRN: [id number]  HPIThis patient presents to the office for surgery for removal of her big toenail right foot.  She says her nails continues to pain.   Review of Systems     Objective:   Physical Exam No changes from her last visit.     Assessment:     Onychomycosis     Plan:     ROV  I decided against performing her surgery at this time.  She is reluctant and I lack my instrument for doing her surgery.  Told her to reschedule her surgery.

## 2015-04-30 ENCOUNTER — Encounter: Payer: Medicare Other | Admitting: *Deleted

## 2015-04-30 ENCOUNTER — Telehealth: Payer: Self-pay | Admitting: Cardiology

## 2015-04-30 NOTE — Telephone Encounter (Signed)
LMOVM reminding pt to send remote transmission.   

## 2015-05-01 ENCOUNTER — Encounter: Payer: Self-pay | Admitting: Cardiology

## 2015-05-02 ENCOUNTER — Telehealth: Payer: Self-pay | Admitting: Internal Medicine

## 2015-05-02 NOTE — Telephone Encounter (Signed)
Spoke w/ pt and informed her that transmission was not received. She verbalized understanding and said she would try again.

## 2015-05-02 NOTE — Telephone Encounter (Signed)
New problem    Pt want to know if her remote pacer check went through yesterday.

## 2015-05-04 ENCOUNTER — Telehealth: Payer: Self-pay | Admitting: Internal Medicine

## 2015-05-04 NOTE — Telephone Encounter (Signed)
LMOVM stating we will attempt to reach her again on 05/08/15.

## 2015-05-04 NOTE — Telephone Encounter (Signed)
NewMessage  Pt wanting to speak w/ Device about remote. Please call back and discuss.

## 2015-05-11 NOTE — Telephone Encounter (Signed)
Pt's daughter, [name], could not send a remote. They did receive their [place]. They were attempting to use [facility] & analog phone line simultaneously. [name] aware to effectively use [employer], [facility] must not be simultaneously connected to an analog phone line. They will reattempt to transmit using [place] only this afternoon.

## 2015-05-15 ENCOUNTER — Telehealth: Payer: Self-pay | Admitting: Internal Medicine

## 2015-05-15 NOTE — Telephone Encounter (Signed)
New message      Pt is having trouble sending her remote transmission.  Please call tomorrow afternoon----she will not be home.

## 2015-05-16 NOTE — Telephone Encounter (Signed)
Spoke w/ and she stated that she wanted her daughter to help her w/ home monitor informed pt to have daughter call me back when she is available to send remote transmission. Pt verbalized understanding.

## 2015-05-30 ENCOUNTER — Other Ambulatory Visit: Payer: Self-pay

## 2015-05-30 MED ORDER — LISINOPRIL 20 MG PO TABS: 20.0000 mg | ORAL_TABLET | Freq: Two times a day (BID) | ORAL | Status: DC

## 2015-05-31 NOTE — Progress Notes (Signed)
   Subjective:    Patient ID: Jessica [name], female    DOB: Aug 28, 1927, 81 y.o.   MRN: [id number]  Patient presents for continued at risk foot care- she has neuropathy and pre ulcerative lesions which we are watching carefully.      Objective:   Physical Exam Vascular exam reveals pedal pulses palpable at 1/4 DP and PT bilateral.neurological sensation is decreased to be a [employer] monofilament at 3/5 sites bilateral light touch and vibratory sensation are also decreased. Prominent plantarflexed metatarsals and metatarsal through the left foot is noted. Prominent callus is also present in this area. All digital nails are elongated, thickened, discolored, dystrophic and clinically mycotic.       Assessment & Plan:  Neuropathy, callus sub-metatarsal 3 left foot, symptomatic mycotic toenails  Plan: Debrided the toenails without complication. Callus did not require trimming today and she will continue to offload and pad. She'll be seen back routinely every 3 months for follow-up care.

## 2015-06-08 ENCOUNTER — Inpatient Hospital Stay (HOSPITAL_COMMUNITY)
Admission: EM | Admit: 2015-06-08 | Discharge: 2015-06-11 | DRG: 291 | Disposition: A | Discharge status: Home / Self Care | Payer: Medicare Other | Attending: Cardiovascular Disease | Admitting: Cardiovascular Disease

## 2015-06-08 ENCOUNTER — Emergency Department (HOSPITAL_COMMUNITY): Payer: Medicare Other

## 2015-06-08 ENCOUNTER — Encounter (HOSPITAL_COMMUNITY): Payer: Self-pay | Admitting: *Deleted

## 2015-06-08 DIAGNOSIS — I10 Essential (primary) hypertension: Secondary | ICD-10-CM | POA: Diagnosis present

## 2015-06-08 DIAGNOSIS — N179 Acute kidney failure, unspecified: Secondary | ICD-10-CM | POA: Diagnosis present

## 2015-06-08 DIAGNOSIS — I481 Persistent atrial fibrillation: Secondary | ICD-10-CM | POA: Diagnosis not present

## 2015-06-08 DIAGNOSIS — Z955 Presence of coronary angioplasty implant and graft: Secondary | ICD-10-CM | POA: Diagnosis not present

## 2015-06-08 DIAGNOSIS — I160 Hypertensive urgency: Secondary | ICD-10-CM | POA: Diagnosis present

## 2015-06-08 DIAGNOSIS — M1990 Unspecified osteoarthritis, unspecified site: Secondary | ICD-10-CM | POA: Diagnosis present

## 2015-06-08 DIAGNOSIS — I480 Paroxysmal atrial fibrillation: Secondary | ICD-10-CM | POA: Diagnosis present

## 2015-06-08 DIAGNOSIS — Z9049 Acquired absence of other specified parts of digestive tract: Secondary | ICD-10-CM | POA: Diagnosis present

## 2015-06-08 DIAGNOSIS — E876 Hypokalemia: Secondary | ICD-10-CM | POA: Diagnosis not present

## 2015-06-08 DIAGNOSIS — Z79899 Other long term (current) drug therapy: Secondary | ICD-10-CM

## 2015-06-08 DIAGNOSIS — Z7982 Long term (current) use of aspirin: Secondary | ICD-10-CM | POA: Diagnosis not present

## 2015-06-08 DIAGNOSIS — E780 Pure hypercholesterolemia: Secondary | ICD-10-CM | POA: Diagnosis present

## 2015-06-08 DIAGNOSIS — I2510 Atherosclerotic heart disease of native coronary artery without angina pectoris: Secondary | ICD-10-CM | POA: Diagnosis present

## 2015-06-08 DIAGNOSIS — Z9013 Acquired absence of bilateral breasts and nipples: Secondary | ICD-10-CM | POA: Diagnosis present

## 2015-06-08 DIAGNOSIS — N183 Chronic kidney disease, stage 3 (moderate): Secondary | ICD-10-CM | POA: Diagnosis present

## 2015-06-08 DIAGNOSIS — Z888 Allergy status to other drugs, medicaments and biological substances status: Secondary | ICD-10-CM | POA: Diagnosis not present

## 2015-06-08 DIAGNOSIS — Z950 Presence of cardiac pacemaker: Secondary | ICD-10-CM | POA: Diagnosis not present

## 2015-06-08 DIAGNOSIS — Z882 Allergy status to sulfonamides status: Secondary | ICD-10-CM | POA: Diagnosis not present

## 2015-06-08 DIAGNOSIS — K219 Gastro-esophageal reflux disease without esophagitis: Secondary | ICD-10-CM | POA: Diagnosis present

## 2015-06-08 DIAGNOSIS — I5031 Acute diastolic (congestive) heart failure: Secondary | ICD-10-CM | POA: Diagnosis present

## 2015-06-08 DIAGNOSIS — I509 Heart failure, unspecified: Secondary | ICD-10-CM | POA: Diagnosis not present

## 2015-06-08 DIAGNOSIS — Z853 Personal history of malignant neoplasm of breast: Secondary | ICD-10-CM | POA: Diagnosis not present

## 2015-06-08 DIAGNOSIS — I700 Atherosclerosis of aorta: Secondary | ICD-10-CM | POA: Diagnosis present

## 2015-06-08 DIAGNOSIS — I495 Sick sinus syndrome: Secondary | ICD-10-CM | POA: Diagnosis not present

## 2015-06-08 DIAGNOSIS — Z7901 Long term (current) use of anticoagulants: Secondary | ICD-10-CM | POA: Diagnosis not present

## 2015-06-08 DIAGNOSIS — I272 Other secondary pulmonary hypertension: Secondary | ICD-10-CM | POA: Diagnosis present

## 2015-06-08 DIAGNOSIS — E1140 Type 2 diabetes mellitus with diabetic neuropathy, unspecified: Secondary | ICD-10-CM | POA: Diagnosis present

## 2015-06-08 DIAGNOSIS — I4891 Unspecified atrial fibrillation: Secondary | ICD-10-CM | POA: Diagnosis not present

## 2015-06-08 DIAGNOSIS — N189 Chronic kidney disease, unspecified: Secondary | ICD-10-CM | POA: Diagnosis present

## 2015-06-08 DIAGNOSIS — R0602 Shortness of breath: Secondary | ICD-10-CM | POA: Diagnosis not present

## 2015-06-08 DIAGNOSIS — I130 Hypertensive heart and chronic kidney disease with heart failure and stage 1 through stage 4 chronic kidney disease, or unspecified chronic kidney disease: Principal | ICD-10-CM | POA: Diagnosis present

## 2015-06-08 HISTORY — DX: Chronic kidney disease, stage 3 unspecified: N18.30

## 2015-06-08 HISTORY — DX: Chronic kidney disease, stage 3 (moderate): N18.3

## 2015-06-08 LAB — BASIC METABOLIC PANEL
Anion gap: 9 (ref 5–15)
BUN: 23 mg/dL — ABNORMAL HIGH (ref 6–20)
CO2: 24 mmol/L (ref 22–32)
Calcium: 10 mg/dL (ref 8.9–10.3)
Chloride: 109 mmol/L (ref 101–111)
Creatinine, Ser: 1.03 mg/dL — ABNORMAL HIGH (ref 0.44–1.00)
GFR calc Af Amer: 55 mL/min — ABNORMAL LOW (ref >60)
GFR, EST NON AFRICAN AMERICAN: 48 mL/min — AB (ref >60)
GLUCOSE: 105 mg/dL — AB (ref 65–99)
POTASSIUM: 3.8 mmol/L (ref 3.5–5.1)
Sodium: 142 mmol/L (ref 135–145)

## 2015-06-08 LAB — I-STAT TROPONIN, ED: TROPONIN I, POC: 0.01 ng/mL (ref 0.00–0.08)

## 2015-06-08 LAB — BRAIN NATRIURETIC PEPTIDE: B Natriuretic Peptide: 828.8 pg/mL — ABNORMAL HIGH (ref 0.0–100.0)

## 2015-06-08 LAB — CBC
HCT: 33.1 % — ABNORMAL LOW (ref 36.0–46.0)
Hemoglobin: 11.3 g/dL — ABNORMAL LOW (ref 12.0–15.0)
MCH: 30.6 pg (ref 26.0–34.0)
MCHC: 34.1 g/dL (ref 30.0–36.0)
MCV: 89.7 fL (ref 78.0–100.0)
PLATELETS: 236 10*3/uL (ref 150–400)
RBC: 3.69 MIL/uL — AB (ref 3.87–5.11)
RDW: 14.4 % (ref 11.5–15.5)
WBC: 7.5 10*3/uL (ref 4.0–10.5)

## 2015-06-08 LAB — HEPATIC FUNCTION PANEL
ALT: 21 U/L (ref 14–54)
AST: 24 U/L (ref 15–41)
Albumin: 3.7 g/dL (ref 3.5–5.0)
Alkaline Phosphatase: 100 U/L (ref 38–126)
Bilirubin, Direct: 0.3 mg/dL (ref 0.1–0.5)
Indirect Bilirubin: 0.5 mg/dL (ref 0.3–0.9)
TOTAL PROTEIN: 6.7 g/dL (ref 6.5–8.1)
Total Bilirubin: 0.8 mg/dL (ref 0.3–1.2)

## 2015-06-08 LAB — GLUCOSE, CAPILLARY: Glucose-Capillary: 134 mg/dL — ABNORMAL HIGH (ref 65–99)

## 2015-06-08 MED ORDER — ROSUVASTATIN CALCIUM 10 MG PO TABS
10.0000 mg | ORAL_TABLET | Freq: Every day | ORAL | Status: DC
  Administered 2015-06-09 – 2015-06-11 (×3): 10 mg via ORAL
  Filled 2015-06-08 (×3): qty 1

## 2015-06-08 MED ORDER — ACETAMINOPHEN 325 MG PO TABS
650.0000 mg | ORAL_TABLET | ORAL | Status: DC | PRN
  Administered 2015-06-09 – 2015-06-10 (×2): 650 mg via ORAL
  Filled 2015-06-08 (×2): qty 2

## 2015-06-08 MED ORDER — LISINOPRIL 20 MG PO TABS
20.0000 mg | ORAL_TABLET | Freq: Two times a day (BID) | ORAL | Status: DC
  Administered 2015-06-08 – 2015-06-11 (×6): 20 mg via ORAL
  Filled 2015-06-08 (×7): qty 1

## 2015-06-08 MED ORDER — SODIUM CHLORIDE 0.9 % IV SOLN: 250.0000 mL | INTRAVENOUS | Status: DC | PRN

## 2015-06-08 MED ORDER — CALCIUM CARBONATE 600 MG PO TABS
600.0000 mg | ORAL_TABLET | Freq: Every day | ORAL | Status: DC
  Filled 2015-06-08: qty 1

## 2015-06-08 MED ORDER — OMEGA-3-ACID ETHYL ESTERS 1 G PO CAPS
1.0000 g | ORAL_CAPSULE | Freq: Every day | ORAL | Status: DC
  Administered 2015-06-09 – 2015-06-11 (×3): 1 g via ORAL
  Filled 2015-06-08 (×3): qty 1

## 2015-06-08 MED ORDER — APIXABAN 5 MG PO TABS
5.0000 mg | ORAL_TABLET | Freq: Two times a day (BID) | ORAL | Status: DC
  Administered 2015-06-08 – 2015-06-11 (×6): 5 mg via ORAL
  Filled 2015-06-08 (×7): qty 1

## 2015-06-08 MED ORDER — FUROSEMIDE 10 MG/ML IJ SOLN
20.0000 mg | Freq: Once | INTRAMUSCULAR | Status: AC
  Administered 2015-06-08: 20 mg via INTRAVENOUS
  Filled 2015-06-08: qty 2

## 2015-06-08 MED ORDER — CALCIUM CARBONATE 1250 (500 CA) MG PO TABS
1.0000 | ORAL_TABLET | Freq: Every day | ORAL | Status: DC
  Administered 2015-06-09 – 2015-06-11 (×3): 500 mg via ORAL
  Filled 2015-06-08 (×5): qty 1

## 2015-06-08 MED ORDER — FUROSEMIDE 10 MG/ML IJ SOLN
40.0000 mg | Freq: Once | INTRAMUSCULAR | Status: AC
Start: 2015-06-08 — End: 2015-06-08
  Administered 2015-06-08: 40 mg via INTRAVENOUS
  Filled 2015-06-08: qty 4

## 2015-06-08 MED ORDER — LORAZEPAM 1 MG PO TABS: 1.0000 mg | ORAL_TABLET | Freq: Every day | ORAL | Status: DC | PRN

## 2015-06-08 MED ORDER — GABAPENTIN 300 MG PO CAPS
300.0000 mg | ORAL_CAPSULE | Freq: Every day | ORAL | Status: DC
  Administered 2015-06-08 – 2015-06-09 (×2): 300 mg via ORAL
  Filled 2015-06-08 (×3): qty 1

## 2015-06-08 MED ORDER — SODIUM CHLORIDE 0.9 % IJ SOLN
3.0000 mL | Freq: Two times a day (BID) | INTRAMUSCULAR | Status: DC
  Administered 2015-06-08 – 2015-06-11 (×4): 3 mL via INTRAVENOUS

## 2015-06-08 MED ORDER — POLYETHYLENE GLYCOL 3350 17 G PO PACK
17.0000 g | PACK | Freq: Every day | ORAL | Status: DC | PRN
  Filled 2015-06-08: qty 1

## 2015-06-08 MED ORDER — VITAMIN B-6 100 MG PO TABS
100.0000 mg | ORAL_TABLET | Freq: Every day | ORAL | Status: DC
  Administered 2015-06-09 – 2015-06-11 (×3): 100 mg via ORAL
  Filled 2015-06-08 (×3): qty 1

## 2015-06-08 MED ORDER — NITROGLYCERIN 0.4 MG SL SUBL: 0.4000 mg | SUBLINGUAL_TABLET | SUBLINGUAL | Status: DC | PRN

## 2015-06-08 MED ORDER — ONDANSETRON HCL 4 MG/2ML IJ SOLN: 4.0000 mg | Freq: Four times a day (QID) | INTRAMUSCULAR | Status: DC | PRN

## 2015-06-08 MED ORDER — POTASSIUM CHLORIDE CRYS ER 20 MEQ PO TBCR
20.0000 meq | EXTENDED_RELEASE_TABLET | Freq: Once | ORAL | Status: AC
  Administered 2015-06-08: 20 meq via ORAL
  Filled 2015-06-08: qty 1

## 2015-06-08 MED ORDER — INSULIN ASPART 100 UNIT/ML ~~LOC~~ SOLN
0.0000 [IU] | Freq: Three times a day (TID) | SUBCUTANEOUS | Status: DC
  Administered 2015-06-09 (×2): 2 [IU] via SUBCUTANEOUS
  Administered 2015-06-10: 1 [IU] via SUBCUTANEOUS
  Administered 2015-06-11: 2 [IU] via SUBCUTANEOUS

## 2015-06-08 MED ORDER — OMEGA-3 FATTY ACIDS 1000 MG PO CAPS: 1.0000 g | ORAL_CAPSULE | Freq: Every morning | ORAL | Status: DC

## 2015-06-08 MED ORDER — CARVEDILOL 25 MG PO TABS
25.0000 mg | ORAL_TABLET | Freq: Two times a day (BID) | ORAL | Status: DC
  Administered 2015-06-08 – 2015-06-11 (×6): 25 mg via ORAL
  Filled 2015-06-08 (×7): qty 1

## 2015-06-08 MED ORDER — HYDROCODONE-ACETAMINOPHEN 5-325 MG PO TABS: 1.0000 | ORAL_TABLET | ORAL | Status: DC | PRN

## 2015-06-08 MED ORDER — HYDRALAZINE HCL 10 MG PO TABS
10.0000 mg | ORAL_TABLET | Freq: Three times a day (TID) | ORAL | Status: DC
  Administered 2015-06-08 – 2015-06-10 (×5): 10 mg via ORAL
  Filled 2015-06-08 (×9): qty 1

## 2015-06-08 MED ORDER — SENNOSIDES 8.6 MG PO TABS: 1.0000 | ORAL_TABLET | Freq: Every day | ORAL | Status: DC | PRN

## 2015-06-08 MED ORDER — SODIUM CHLORIDE 0.9 % IJ SOLN: 3.0000 mL | INTRAMUSCULAR | Status: DC | PRN

## 2015-06-08 MED ORDER — ASPIRIN 81 MG PO TABS: 81.0000 mg | ORAL_TABLET | Freq: Every day | ORAL | Status: DC

## 2015-06-08 NOTE — ED Notes (Signed)
Iv team at bedside  

## 2015-06-08 NOTE — H&P (Signed)
History and Physical  Patient ID: Jessica [name] MRN: [id number], DOB: 11/04/1926 Date of Encounter: 06/08/2015, 3:14 PM Primary Physician: [name], MD Primary Cardiologist: Dr. [name]  Chief Complaint: SOB, leg swelling Reason for Admission: CHF  HPI: Jessica [name] is an 81 y/o female with CAD (DESx2 to mid and distal RCA in 2010), DM with peripheral neuropathy, HTN, HLD, SSS s/p Medtronic pacemaker 2011, hypertensive heart disease, diastolic dysfunction without prior CHF, PAF (discovered on device interrogation 10/2014), probable CKD stage III (based on Cr 1.0-1.1), moderate pulm HTN (by last echo) who presented to [facility] with SOB and edema.Last echo 02/2013: mild LVH, EF 55%, mild LVH, grade 2 DD, aortic sclerosis without stenosis, mild MR, mild LAE, moderate pulm HTN - PASP 62mmHg.  For the past week she has noticed progressive SOB, LEE, orthopnea, and a 10lb weight gain. Her mobility is somewhat limited but she says she still gets around [place]. She walks with a cane. She fell in February and broke her arm but no other recent falls. No chest pain. She does admit to eating a decent amount of salt but has tried to cut down over ever since she started feeling bad. Drinks somewhere between 36-64 oz of water per day. Reports med compliance. Due to persistence of symptoms, she came to the ER. BP up in the ER - peak 192/82. She took home meds today. Her daughter says BP usually runs in the 140s. In the ER, Hgb 11.3 (prev 10.8-12.5), BUN/Cr 23/1.03, troponin neg x 1. CXR: cardiomegaly, mild edema and small left pleural effusion; atherosclerosis of the thoracic aorta. She is in rate controlled AF today. Device interrogation by [facility]/[employer]tr[employer] pending. Patient denies fever, chills, palpitations, syncope, or unusual bleeding. She is pending a dose of 40mg  IV Lasix as ordered by the ER.   Past Medical History  Diagnosis Date  . Hypertension   . Diabetes mellitus     NON INSULIN  DEPENDENT  . Hypercholesterolemia   . DJD (degenerative joint disease)   . History of diabetic neuropathy   . CAD (coronary artery disease)     a. s/p DESx2 to mid and distal RCA in 2010.  . SSS (sick sinus syndrome)     a. s/p Medtronic PPM by JA for SSS and syncope 9/11.  . Breast cancer   . GERD (gastroesophageal reflux disease)   . Hemorrhoids   . Cholelithiasis 09/12/2013    Lap Chole on 09/14/13   . Paroxysmal atrial fibrillation     discovered on PPM interrogation 12/15, CHAD2VASC score is 6  . CKD (chronic kidney disease), stage III      Surgical History:  Past Surgical History  Procedure Laterality Date  . Cardiac catheterization  07/05/2009    EF 55-60%  . Insert / replace / remove pacemaker  9/11    SSS and syncope, implanted by JA (MDT)  . Mastectomy  1992    BILATERAL WITH RECONSTRUCTION  . Colonoscopy    . Ercp N/A 09/13/2013    Procedure: ENDOSCOPIC RETROGRADE CHOLANGIOPANCREATOGRAPHY (ERCP);  Surgeon: [name], MD;  Location: [facility] ENDOSCOPY;  Service: Endoscopy;  Laterality: N/A;  . Cholecystectomy N/A 09/14/2013    Procedure: LAPAROSCOPIC CHOLECYSTECTOMY WITH INTRAOPERATIVE CHOLANGIOGRAM;  Surgeon: [name], MD;  Location: [place];  Service: General;  Laterality: N/A;     Home Meds: Prior to Admission medications   Medication Sig Start Date End Date Taking? Authorizing Provider  apixaban (ELIQUIS) 5 MG TABS tablet Take  1 tablet (5 mg total) by mouth 2 (two) times daily. 11/23/14   Darlin Coco, MD  aspirin 81 MG tablet Take 81 mg by mouth daily.    Historical Provider, MD  calcium carbonate (OS-CAL) 600 MG TABS tablet Take 600 mg by mouth 2 (two) times daily with a meal.    Historical Provider, MD  carvedilol (COREG) 25 MG tablet TAKE ONE TABLET BY MOUTH TWICE DAILY. 03/16/15   Darlin Coco, MD  Cyanocobalamin (VITAMIN B12 PO) Take 1 tablet by mouth daily.     Historical Provider, MD  fish oil-omega-3 fatty acids 1000 MG capsule Take 1 g by  mouth every morning.     Historical Provider, MD  gabapentin (NEURONTIN) 300 MG capsule TAKE ONE CAPSULE BY MOUTH ONCE DAILY 08/29/14   Darlin Coco, MD  hydrALAZINE (APRESOLINE) 10 MG tablet TAKE ONE TABLET BY MOUTH THREE TIMES DAILY 08/14/14   Darlin Coco, MD  hydrochlorothiazide (MICROZIDE) 12.5 MG capsule TAKE ONE CAPSULE BY MOUTH ONCE DAILY 04/17/15   Darlin Coco, MD  HYDROcodone-acetaminophen (NORCO/VICODIN) 5-325 MG per tablet Take 1 tablet by mouth every 4 (four) hours as needed for moderate pain or severe pain. 12/23/14   Sinda Du, MD  lisinopril (PRINIVIL,ZESTRIL) 20 MG tablet Take 1 tablet (20 mg total) by mouth 2 (two) times daily. 05/30/15   Darlin Coco, MD  LORazepam (ATIVAN) 1 MG tablet TAKE ONE TABLET BY MOUTH THREE TIMES DAILY AS NEEDED 05/02/14   Darlin Coco, MD  metFORMIN (GLUCOPHAGE) 500 MG tablet TAKE ONE TABLET BY MOUTH ONCE DAILY WITH  BREAKFAST 07/27/14   Darlin Coco, MD  Multiple Vitamins-Minerals (ECHINACEA ACZ PO) Take by mouth.    Historical Provider, MD  nitroGLYCERIN (NITROSTAT) 0.4 MG SL tablet Place 1 tablet (0.4 mg total) under the tongue every 5 (five) minutes as needed for chest pain. x3 doses as needed for chest pain 08/14/14   Darlin Coco, MD  pyridOXINE (VITAMIN B-6) 100 MG tablet Take 100 mg by mouth daily.    Historical Provider, MD  rosuvastatin (CRESTOR) 10 MG tablet TAKE ONE TABLET BY MOUTH ONCE DAILY IN THE MORNING 08/14/14   Darlin Coco, MD  senna (SENOKOT) 8.6 MG tablet Take 2 tablets by mouth daily.     Historical Provider, MD    Allergies:  Allergies  Allergen Reactions  . Amlodipine     Swelling   . Lyrica [Pregabalin] Swelling    wgt gain  . Sulfonamide Derivatives Rash    History   Social History  . Marital Status: Widowed    Spouse Name: N/A  . Number of Children: N/A  . Years of Education: N/A   Occupational History  . Not on file.   Social History Main Topics  . Smoking status: Never Smoker    . Smokeless tobacco: Never Used  . Alcohol Use: No  . Drug Use: No  . Sexual Activity: Not on file   Other Topics Concern  . Not on file   Social History Narrative     Family History  Problem Relation Age of Onset  . Cancer Mother   . Cancer Father     Review of Systems: All other systems reviewed and are otherwise negative except as noted above.  Labs:   Lab Results  Component Value Date   WBC 7.5 06/08/2015   HGB 11.3* 06/08/2015   HCT 33.1* 06/08/2015   MCV 89.7 06/08/2015   PLT 236 06/08/2015    Recent Labs Lab 06/08/15 1241  NA 142  K 3.8  CL 109  CO2 24  BUN 23*  CREATININE 1.03*  CALCIUM 10.0  GLUCOSE 105*   No results for input(s): CKTOTAL, CKMB, TROPONINI in the last 72 hours. Lab Results  Component Value Date   CHOL 131 12/12/2014   HDL 54.40 12/12/2014   LDLCALC 62 12/12/2014   TRIG 74.0 12/12/2014   No results found for: DDIMER  Radiology/Studies:  Dg Chest 2 View  06/08/2015   CLINICAL DATA:  Shortness of breath for 1 week.  EXAM: CHEST - 2 VIEW  COMPARISON:  One-view chest x-ray 12/23/2014  FINDINGS: The heart is enlarged. Atherosclerotic calcifications are again noted at the aortic arch. Mild edema is present. A dual lead pacemaker is in place. A small left pleural effusion is noted. Bibasilar atelectasis is more prominent on the left.  The proximal right humerus fracture has healed.  IMPRESSION: 1. Cardiomegaly with mild edema and a left pleural effusion compatible with congestive heart failure. 2. Pacing wires are stable. 3. Atherosclerosis of the thoracic aorta.   Electronically Signed   By: San Morelle M.D.   On: 06/08/2015 13:02   Wt Readings from Last 3 Encounters:  06/08/15 162 lb 8 oz (73.71 kg)  12/23/14 156 lb (70.761 kg)  12/12/14 156 lb (70.761 kg)    EKG: Atrial fibrillation 72bpm occasional V-pacing, TWI inferiorly as well as V3-V6 with mild associated ST sagging, appear similar to prior tracings, slightly more  accentuated today  Physical Exam: Blood pressure 175/81, pulse 64, temperature 97.9 F (36.6 C), temperature source Oral, resp. rate 22, height 5\' 2"  (1.575 m), weight 162 lb 8 oz (73.71 kg), SpO2 96 %. General: Well developed, well nourished WF in no acute distress. Kyphotic posture Head: Normocephalic, atraumatic, sclera non-icteric, no xanthomas, nares are without discharge. Neck: JVD mildly elevated. Lungs: Diminished BS at bases, coarse 1/2 way up, no wheezes or rhonchi. Breathing is unlabored. Heart: Irregularly irregular, rate controlled, with S1 S2. No murmurs, rubs, or gallops appreciated. Abdomen: Soft, non-tender, non-distended with normoactive bowel sounds. No hepatomegaly. No rebound/guarding. No obvious abdominal masses. Msk: Strength and tone appear normal for age. Extremities: No clubbing or cyanosis. No edema. Distal pedal pulses are 2+ and equal bilaterally. Neuro: Alert and oriented X 3. No facial asymmetry. No focal deficit. Moves all extremities spontaneously. Psych: Responds to questions appropriately with a normal affect.    ASSESSMENT AND PLAN:   1. Acute diastolic CHF - will diurese with IV Lasix. She received 40mg  IV Lasix in the ER. Will give another 20mg  this evening.  Will also give 5meq of KCl. She also already took 25mg  of HCTZ this AM. Will follow up renal function in AM before dosing further diuretics since she is Lasix naive with CKD. Update 2D echocardiogram. Importance of salt restriction discussed. Nutrition consult given new CHF. She's had diastolic dysfunction but never clinical heart failure before.   2. Paroxysmal atrial fibrillation - she is back in AF. She was noted to have PAF on interrogation in 10/2014 but did not require any DCCV or antiarrhythmics to get back into NSR. Will interrogate device to see if there is any temporal correlation with onset of CHF. Will continue Eliquis at present dose.   3. Accelerated hypertension - follow  with diuresis. Will hold HCTZ given that we are going to be using Lasix. Will titrate hydralazine if necessary.  4. CKD stage III, appearing stable - follow renal function.  5. SSS s/p Medtronic PPM - will interrogate device.  6. CAD s/p PCI 2010 - no recent CP. Will await updated echo to determine if any updated ischemic testing is necessary. Troponin negative.  SignedMelina Copa PA-C 06/08/2015, 3:14 PM Pager: [phone]  I have personally seen and examined this patient with [name], PA-C. I agree with the assessment and plan as outlined above. She is here with elevated BP, acute diastolic CHF. She is volume overloaded. Will admit to telemetry. Will diurese with IV Lasix. Will update echo tomorrow.   MCALHANY,CHRISTOPHER 06/08/2015 4:23 PM

## 2015-06-08 NOTE — ED Provider Notes (Signed)
CSN: [id number]     Arrival date & time 06/08/15  1223 History   First MD Initiated Contact with Patient 06/08/15 1245     Chief Complaint  Patient presents with  . Shortness of Breath     (Consider location/radiation/quality/duration/timing/severity/associated sxs/prior Treatment) Patient is a 81 y.o. female presenting with shortness of breath. The history is provided by the patient and a relative.  Shortness of Breath Severity:  Moderate Onset quality:  Gradual Duration:  1 week Timing:  Constant Progression:  Worsening Chronicity:  New Context: activity   Relieved by:  Sitting up Exacerbated by: lying flat. Associated symptoms: no chest pain, no cough, no fever, no headaches, no vomiting and no wheezing    81 yo F with a history of ischemic heart disease and ICD comes in with a chief complaint of shortness of breath. This been going on for the past week. Associated with increased leg swelling orthopnea PND. Patient denies fevers chills denies cough. Has a echo in the system with a EF of 55-60%.    Past Medical History  Diagnosis Date  . Hypertension   . Diabetes mellitus     NON INSULIN DEPENDENT  . Hypercholesterolemia   . DJD (degenerative joint disease)   . History of diabetic neuropathy   . CAD (coronary artery disease)     a. s/p DESx2 to mid and distal RCA in 2010.  . SSS (sick sinus syndrome)     a. s/p Medtronic PPM by JA for SSS and syncope 9/11.  . Breast cancer   . GERD (gastroesophageal reflux disease)   . Hemorrhoids   . Cholelithiasis 09/12/2013    Lap Chole on 09/14/13   . Paroxysmal atrial fibrillation     discovered on PPM interrogation 12/15, CHAD2VASC score is 6  . CKD (chronic kidney disease), stage III    Past Surgical History  Procedure Laterality Date  . Cardiac catheterization  07/05/2009    EF 55-60%  . Insert / replace / remove pacemaker  9/11    SSS and syncope, implanted by JA (MDT)  . Mastectomy  1992    BILATERAL WITH RECONSTRUCTION   . Colonoscopy    . Ercp N/A 09/13/2013    Procedure: ENDOSCOPIC RETROGRADE CHOLANGIOPANCREATOGRAPHY (ERCP);  Surgeon: [name], MD;  Location[facility] ENDOSCOPY;  Service: Endoscopy;  Laterality: N/A;  . Cholecystectomy N/A 09/14/2013    Procedure: LAPAROSCOPIC CHOLECYSTECTOMY WITH INTRAOPERATIVE CHOLANGIOGRAM;  Surgeon: [name], MD;  Location: [place] [facility];  Service: General;  Laterality: N/A;   Family History  Problem Relation Age of Onset  . Cancer Mother   . Cancer Father    History  Substance Use Topics  . Smoking status: Never Smoker   . Smokeless tobacco: Never Used  . Alcohol Use: No   OB History    No data available     Review of Systems  Constitutional: Negative for fever and chills.  HENT: Negative for congestion and rhinorrhea.   Eyes: Negative for redness and visual disturbance.  Respiratory: Positive for shortness of breath. Negative for cough and wheezing.   Cardiovascular: Negative for chest pain and palpitations.  Gastrointestinal: Negative for nausea and vomiting.  Genitourinary: Negative for dysuria and urgency.  Musculoskeletal: Negative for myalgias and arthralgias.  Skin: Negative for pallor and wound.  Neurological: Negative for dizziness and headaches.      Allergies  Lyrica; Amlodipine; and Sulfonamide derivatives  Home Medications   Prior to Admission medications   Medication Sig Start Date  End Date Taking? Authorizing Provider  apixaban (ELIQUIS) 5 MG TABS tablet Take 1 tablet (5 mg total) by mouth 2 (two) times daily. 11/23/14  Yes Darlin Coco, MD  calcium carbonate (OS-CAL) 600 MG TABS tablet Take 600 mg by mouth daily with breakfast.    Yes Historical Provider, MD  carvedilol (COREG) 25 MG tablet TAKE ONE TABLET BY MOUTH TWICE DAILY. 03/16/15  Yes Darlin Coco, MD  Cyanocobalamin (VITAMIN B12 PO) Take 1 tablet by mouth daily.    Yes Historical Provider, MD  fish oil-omega-3 fatty acids 1000 MG capsule Take 1 g by mouth every  morning.    Yes Historical Provider, MD  gabapentin (NEURONTIN) 300 MG capsule TAKE ONE CAPSULE BY MOUTH ONCE DAILY 08/29/14  Yes Darlin Coco, MD  hydrALAZINE (APRESOLINE) 10 MG tablet TAKE ONE TABLET BY MOUTH THREE TIMES DAILY 08/14/14  Yes Darlin Coco, MD  hydrochlorothiazide (MICROZIDE) 12.5 MG capsule TAKE ONE CAPSULE BY MOUTH ONCE DAILY 04/17/15  Yes Darlin Coco, MD  lisinopril (PRINIVIL,ZESTRIL) 20 MG tablet Take 1 tablet (20 mg total) by mouth 2 (two) times daily. 05/30/15  Yes Darlin Coco, MD  metFORMIN (GLUCOPHAGE) 500 MG tablet TAKE ONE TABLET BY MOUTH ONCE DAILY WITH  BREAKFAST 07/27/14  Yes Darlin Coco, MD  Multiple Vitamins-Minerals (ECHINACEA ACZ PO) Take 1 tablet by mouth daily.    Yes Historical Provider, MD  nitroGLYCERIN (NITROSTAT) 0.4 MG SL tablet Place 1 tablet (0.4 mg total) under the tongue every 5 (five) minutes as needed for chest pain. x3 doses as needed for chest pain 08/14/14  Yes Darlin Coco, MD  polyethylene glycol [facility] / GLYCOLAX) packet Take 17 g by mouth daily as needed for moderate constipation.   Yes Historical Provider, MD  pyridOXINE (VITAMIN B-6) 100 MG tablet Take 100 mg by mouth daily.   Yes Historical Provider, MD  rosuvastatin (CRESTOR) 10 MG tablet TAKE ONE TABLET BY MOUTH ONCE DAILY IN THE MORNING 08/14/14  Yes Darlin Coco, MD  aspirin 81 MG tablet Take 81 mg by mouth daily.    Historical Provider, MD  HYDROcodone-acetaminophen (NORCO/VICODIN) 5-325 MG per tablet Take 1 tablet by mouth every 4 (four) hours as needed for moderate pain or severe pain. 12/23/14   Sinda Du, MD  LORazepam (ATIVAN) 1 MG tablet TAKE ONE TABLET BY MOUTH THREE TIMES DAILY AS NEEDED 05/02/14   Darlin Coco, MD  senna (SENOKOT) 8.6 MG tablet Take 2 tablets by mouth daily.     Historical Provider, MD   BP 175/81 mmHg  Pulse 64  Temp(Src) 97.9 F (36.6 C) (Oral)  Resp 22  Ht 5\' 2"  (1.575 m)  Wt 162 lb 8 oz (73.71 kg)  BMI 29.71 kg/m2  SpO2  96% Physical Exam  Constitutional: She is oriented to person, place, and time. She appears well-developed and well-nourished. No distress.  HENT:  Head: Normocephalic and atraumatic.  JVD up to the angle of jaw  Eyes: EOM are normal. Pupils are equal, round, and reactive to light.  Neck: Normal range of motion. Neck supple.  Cardiovascular: Normal rate and regular rhythm.  Exam reveals no gallop and no friction rub.   No murmur heard. Pulmonary/Chest: Effort normal. She has no wheezes. She has rales (in bases).  Abdominal: Soft. She exhibits no distension. There is no tenderness.  Musculoskeletal: She exhibits edema (3+ up to the knees). She exhibits no tenderness.  Neurological: She is alert and oriented to person, place, and time.  Skin: Skin is warm and dry. She  is not diaphoretic.  Psychiatric: She has a normal mood and affect. Her behavior is normal.    ED Course  Procedures (including critical care time) Labs Review Labs Reviewed  BASIC METABOLIC PANEL - Abnormal; Notable for the following:    Glucose, Bld 105 (*)    BUN 23 (*)    Creatinine, Ser 1.03 (*)    GFR calc non Af Amer 48 (*)    GFR calc Af Amer 55 (*)    All other components within normal limits  CBC - Abnormal; Notable for the following:    RBC 3.69 (*)    Hemoglobin 11.3 (*)    HCT 33.1 (*)    All other components within normal limits  BRAIN NATRIURETIC PEPTIDE  I-STAT TROPOININ, ED    Imaging Review Dg Chest 2 View  06/08/2015   CLINICAL DATA:  Shortness of breath for 1 week.  EXAM: CHEST - 2 VIEW  COMPARISON:  One-view chest x-ray 12/23/2014  FINDINGS: The heart is enlarged. Atherosclerotic calcifications are again noted at the aortic arch. Mild edema is present. A dual lead pacemaker is in place. A small left pleural effusion is noted. Bibasilar atelectasis is more prominent on the left.  The proximal right humerus fracture has healed.  IMPRESSION: 1. Cardiomegaly with mild edema and a left pleural  effusion compatible with congestive heart failure. 2. Pacing wires are stable. 3. Atherosclerosis of the thoracic aorta.   Electronically Signed   By: San Morelle M.D.   On: 06/08/2015 13:02     EKG Interpretation   Date/Time:  Friday June 08 2015 12:28:05 EDT Ventricular Rate:  72 PR Interval:    QRS Duration: 88 QT Interval:  410 QTC Calculation: 448 R Axis:   44 Text Interpretation:  Atrial fibrillation with frequent ventricular-paced  complexes Anterior infarct , age undetermined ST \\T \ T wave abnormality,  consider inferolateral ischemia Abnormal ECG Otherwise no significant  change Confirmed by Sheylin Scharnhorst MD, Quillian Quince [phone]) on 06/08/2015 4:02:17 PM      MDM   Final diagnoses:  Acute heart failure, unspecified heart failure type  Paroxysmal atrial fibrillation    81 yo F with a chief complaint of shortness of breath and orthopnea. Patient clinically in heart failure. JVD up to the angle of the jaw. No prior history of heart failure. Spoke with cardiology about seeing the patient.  Cardiology will admit to the hospital.  The patients results and plan were reviewed and discussed.   Any x-rays performed were independently reviewed by myself.   Differential diagnosis were considered with the presenting HPI.  Medications  furosemide (LASIX) injection 40 mg (40 mg Intravenous Given 06/08/15 1502)    Filed Vitals:   06/08/15 1315 06/08/15 1345 06/08/15 1415 06/08/15 1500  BP: 184/97 173/78 176/81 175/81  Pulse: 68 73 66 64  Temp:      TempSrc:      Resp: 21 22 28 22   Height:      Weight:      SpO2: 94% 96% 94% 96%    Final diagnoses:  Acute heart failure, unspecified heart failure type  Paroxysmal atrial fibrillation    Admission/ observation were discussed with the admitting physician, patient and/or family and they are comfortable with the plan.     Deno Etienne, DO 06/08/15 1620

## 2015-06-08 NOTE — ED Notes (Signed)
Pt returned from xray

## 2015-06-08 NOTE — ED Notes (Signed)
Off unit with xray. 

## 2015-06-08 NOTE — Progress Notes (Signed)
Per Tomi Bamberger [employer] rep, patient has been in afib since July 16th but has remained rate controlled. Will need to decide once she is teed up whether to attempt DCCV. I also clarified code status with patient who affirms she wishes to be full code.  Also note she is starting to urinate with the Lasix but has been incontinent. I/O's may not be totally accurate in AM. [name] PA-C

## 2015-06-08 NOTE — ED Notes (Signed)
Pt reports sob x 1 week, denies cough. Pt has cardiac history. Swelling noted to bilateral feet. ekg done at triage, airway intact.

## 2015-06-09 ENCOUNTER — Inpatient Hospital Stay (HOSPITAL_COMMUNITY): Payer: Medicare Other

## 2015-06-09 DIAGNOSIS — I4819 Other persistent atrial fibrillation: Secondary | ICD-10-CM | POA: Insufficient documentation

## 2015-06-09 DIAGNOSIS — I481 Persistent atrial fibrillation: Secondary | ICD-10-CM

## 2015-06-09 DIAGNOSIS — I509 Heart failure, unspecified: Secondary | ICD-10-CM

## 2015-06-09 DIAGNOSIS — I2510 Atherosclerotic heart disease of native coronary artery without angina pectoris: Secondary | ICD-10-CM | POA: Diagnosis present

## 2015-06-09 LAB — LIPID PANEL
CHOL/HDL RATIO: 2.7 ratio
Cholesterol: 101 mg/dL (ref 0–200)
HDL: 38 mg/dL — ABNORMAL LOW (ref >40)
LDL Cholesterol: 53 mg/dL (ref 0–99)
Triglycerides: 52 mg/dL (ref <150)
VLDL: 10 mg/dL (ref 0–40)

## 2015-06-09 LAB — GLUCOSE, CAPILLARY
Glucose-Capillary: 95 mg/dL (ref 65–99)
Glucose-Capillary: 175 mg/dL — ABNORMAL HIGH (ref 65–99)
Glucose-Capillary: 152 mg/dL — ABNORMAL HIGH (ref 65–99)
Glucose-Capillary: 98 mg/dL (ref 65–99)

## 2015-06-09 LAB — CBC WITH DIFFERENTIAL/PLATELET
BASOS PCT: 0 % (ref 0–1)
Basophils Absolute: 0 10*3/uL (ref 0.0–0.1)
EOS ABS: 0.1 10*3/uL (ref 0.0–0.7)
Eosinophils Relative: 1 % (ref 0–5)
HEMATOCRIT: 34.5 % — AB (ref 36.0–46.0)
HEMOGLOBIN: 11.9 g/dL — AB (ref 12.0–15.0)
LYMPHS ABS: 1.8 10*3/uL (ref 0.7–4.0)
LYMPHS PCT: 18 % (ref 12–46)
MCH: 30.5 pg (ref 26.0–34.0)
MCHC: 34.5 g/dL (ref 30.0–36.0)
MCV: 88.5 fL (ref 78.0–100.0)
Monocytes Absolute: 0.5 10*3/uL (ref 0.1–1.0)
Monocytes Relative: 5 % (ref 3–12)
NEUTROS PCT: 76 % (ref 43–77)
Neutro Abs: 7.3 10*3/uL (ref 1.7–7.7)
Platelets: 267 10*3/uL (ref 150–400)
RBC: 3.9 MIL/uL (ref 3.87–5.11)
RDW: 14.2 % (ref 11.5–15.5)
WBC: 9.6 10*3/uL (ref 4.0–10.5)

## 2015-06-09 LAB — BASIC METABOLIC PANEL
Anion gap: 9 (ref 5–15)
BUN: 22 mg/dL — ABNORMAL HIGH (ref 6–20)
CHLORIDE: 106 mmol/L (ref 101–111)
CO2: 27 mmol/L (ref 22–32)
Calcium: 9.7 mg/dL (ref 8.9–10.3)
Creatinine, Ser: 1.15 mg/dL — ABNORMAL HIGH (ref 0.44–1.00)
GFR calc Af Amer: 48 mL/min — ABNORMAL LOW (ref >60)
GFR calc non Af Amer: 42 mL/min — ABNORMAL LOW (ref >60)
GLUCOSE: 117 mg/dL — AB (ref 65–99)
POTASSIUM: 3.3 mmol/L — AB (ref 3.5–5.1)
Sodium: 142 mmol/L (ref 135–145)

## 2015-06-09 MED ORDER — FUROSEMIDE 40 MG PO TABS
40.0000 mg | ORAL_TABLET | Freq: Every day | ORAL | Status: DC
  Administered 2015-06-09 – 2015-06-11 (×3): 40 mg via ORAL
  Filled 2015-06-09 (×3): qty 1

## 2015-06-09 MED ORDER — POTASSIUM CHLORIDE CRYS ER 20 MEQ PO TBCR
30.0000 meq | EXTENDED_RELEASE_TABLET | Freq: Two times a day (BID) | ORAL | Status: AC
  Administered 2015-06-09 (×2): 30 meq via ORAL
  Filled 2015-06-09 (×2): qty 1

## 2015-06-09 MED ORDER — GABAPENTIN 300 MG PO CAPS
300.0000 mg | ORAL_CAPSULE | Freq: Every day | ORAL | Status: DC
  Filled 2015-06-09: qty 1

## 2015-06-09 NOTE — Plan of Care (Signed)
Problem: Food- and Nutrition-Related Knowledge Deficit (NB-1.1) Goal: Nutrition education Formal process to instruct or train a patient/client in a skill or to impart knowledge to help patients/clients voluntarily manage or modify food choices and eating behavior to maintain or improve health. Outcome: Completed/Met Date Met:  06/09/15 Nutrition Education Note  RD consulted for nutrition education regarding new onset CHF.  RD provided "Heart Failure Nutrition Therapy" handout from the Academy of [employer] and Dietetics. Reviewed patient's dietary recall.  For breakfast she usually has cold cereal (Cheerios) with 2% milk Lunch is usually yogurt or a Sandwich. The sandwiches usually dont contain meat. She likes International aid/development worker varies. Pt reports her daughter prepares her a lot of [place] food. She also has spaghetti frequently.  Beverages: Diet ice tea In general, she prepares her own Breakfast and Lunch, while her daughter prepares her dinner.  Pt reports that the daughter also follows a low sodium diet and that "she knows what to do"   Provided examples on ways to decrease sodium intake in diet. Discouraged intake of processed foods and use of salt shaker- patient reports that she never eats frozen meals (tv dinner, frozen pizzas) Encouraged fresh fruits and vegetables as well as whole grain sources of carbohydrates to maximize fiber intake. Also reccommended that she avoids lunch meats, canned soups and breakfast meats as these all are higher sodium options. She says she does not eat any of these. The only higher sodium option she reports eating is a handful of chips each day.   RD gave patient recommendation to consume no more than 2000 mg of sodium/day. Though it seems she already eats relatively little sodium, I told her she could enjoy a higher salt option ([place] food), but just to then eat low sodium options the rest of the day to stay under the 2000 mg limit.   We very  briefly talked about a fluid restriction if she ever needed to go on one.   Expect excellent compliance.-Dietary recall revealed to be mostly very appropriate for CHF patient.   Body mass index is 28.14 kg/(m^2). Pt meets criteria for overweight based on current BMI.  Current diet order is Heart Healthy, Carb modified, patient is consuming approximately 100% of meals at this time. Labs and medications reviewed. No further nutrition interventions warranted at this time. If additional nutrition issues arise, please re-consult RD.   Burtis Junes RD, LDN Nutrition Pager: [phone] 06/09/2015 9:57 AM

## 2015-06-09 NOTE — Progress Notes (Signed)
  Echocardiogram 2D Echocardiogram has been performed.  Darlina Sicilian M 06/09/2015, 1:20 PM

## 2015-06-09 NOTE — Progress Notes (Signed)
Patient Name: Jessica [name] Date of Encounter: 06/09/2015  Active Problems:   Acute diastolic CHF (congestive heart failure)   Length of Stay: 1  SUBJECTIVE Breathing well, edema gone, but weak and had leg cramp. Unable to quantify response to diuretics due to incontinence, but weight has decreased 10 lb. Mild uptick in creatinine. Pacemaker check shows persistent atrial fibrillation since July 16, possibly the cause for decompensation in the setting of chronic diastolic dysfunction and pulmonary HTN. Echo pending.  CURRENT MEDS . apixaban  5 mg Oral BID  . calcium carbonate  1 tablet Oral Q breakfast  . carvedilol  25 mg Oral BID  . gabapentin  300 mg Oral Daily  . hydrALAZINE  10 mg Oral 3 times per day  . insulin aspart  0-9 Units Subcutaneous TID WC  . lisinopril  20 mg Oral BID  . omega-3 acid ethyl esters  1 g Oral Daily  . pyridOXINE  100 mg Oral Daily  . rosuvastatin  10 mg Oral Daily  . sodium chloride  3 mL Intravenous Q12H    OBJECTIVE   Intake/Output Summary (Last 24 hours) at 06/09/15 1032 Last data filed at 06/09/15 0909  Gross per 24 hour  Intake    540 ml  Output      0 ml  Net    540 ml   OUTPUT INACCURATE due to incontinence  Filed Weights   06/08/15 1247 06/08/15 1833 06/09/15 0408  Weight: 162 lb 8 oz (73.71 kg) 162 lb 11.2 oz (73.8 kg) 153 lb 14.1 oz (69.8 kg)    PHYSICAL EXAM Filed Vitals:   06/08/15 1833 06/08/15 2108 06/09/15 0408 06/09/15 1019  BP: 189/85 188/74 159/75 134/76  Pulse: 71 64 68 74  Temp: 97.6 F (36.4 C) 98.2 F (36.8 C) 97.8 F (36.6 C) 97.6 F (36.4 C)  TempSrc: Oral Oral Oral Oral  Resp: 18 18 18 18   Height:      Weight: 162 lb 11.2 oz (73.8 kg)  153 lb 14.1 oz (69.8 kg)   SpO2: 95% 98% 98% 95%   General: Alert, oriented x3, no distress Head: no evidence of trauma, PERRL, EOMI, no exophtalmos or lid lag, no myxedema, no xanthelasma; normal ears, nose and oropharynx Neck: normal jugular venous pulsations and no  hepatojugular reflux; brisk carotid pulses without delay and no carotid bruits Chest: clear to auscultation, no signs of consolidation by percussion or palpation, normal fremitus, symmetrical and full respiratory excursions Cardiovascular: normal position and quality of the apical impulse, irregular rhythm, normal first and second heart sounds, no rubs or gallops, no murmur Abdomen: no tenderness or distention, no masses by palpation, no abnormal pulsatility or arterial bruits, normal bowel sounds, no hepatosplenomegaly Extremities: no clubbing, cyanosis or edema; 2+ radial, ulnar and brachial pulses bilaterally; 2+ right femoral, posterior tibial and dorsalis pedis pulses; 2+ left femoral, posterior tibial and dorsalis pedis pulses; no subclavian or femoral bruits Neurological: grossly nonfocal  LABS  CBC  Recent Labs  06/08/15 1241 06/09/15 0330  WBC 7.5 9.6  NEUTROABS  --  7.3  HGB 11.3* 11.9*  HCT 33.1* 34.5*  MCV 89.7 88.5  PLT 236 732   Basic Metabolic Panel  Recent Labs  06/08/15 1241 06/09/15 0330  NA 142 142  K 3.8 3.3*  CL 109 106  CO2 24 27  GLUCOSE 105* 117*  BUN 23* 22*  CREATININE 1.03* 1.15*  CALCIUM 10.0 9.7   Liver Function Tests  Recent Labs  06/08/15 1951  AST 24  ALT 21  ALKPHOS 100  BILITOT 0.8  PROT 6.7  ALBUMIN 3.7   No results for input(s): LIPASE, AMYLASE in the last 72 hours. Cardiac Enzymes No results for input(s): CKTOTAL, CKMB, CKMBINDEX, TROPONINI in the last 72 hours. BNP Invalid input(s): POCBNP D-Dimer No results for input(s): DDIMER in the last 72 hours. Hemoglobin A1C No results for input(s): HGBA1C in the last 72 hours. Fasting Lipid Panel  Recent Labs  06/09/15 0330  CHOL 101  HDL 38*  LDLCALC 53  TRIG 52  CHOLHDL 2.7   Thyroid Function Tests No results for input(s): TSH, T4TOTAL, T3FREE, THYROIDAB in the last 72 hours.  Invalid input(s): [place]  Radiology Studies Imaging results have been reviewed and Dg  Chest 2 View  06/08/2015   CLINICAL DATA:  Shortness of breath for 1 week.  EXAM: CHEST - 2 VIEW  COMPARISON:  One-view chest x-ray 12/23/2014  FINDINGS: The heart is enlarged. Atherosclerotic calcifications are again noted at the aortic arch. Mild edema is present. A dual lead pacemaker is in place. A small left pleural effusion is noted. Bibasilar atelectasis is more prominent on the left.  The proximal right humerus fracture has healed.  IMPRESSION: 1. Cardiomegaly with mild edema and a left pleural effusion compatible with congestive heart failure. 2. Pacing wires are stable. 3. Atherosclerosis of the thoracic aorta.   Electronically Signed   By: San Morelle M.D.   On: 06/08/2015 13:02    TELE atrial fibrillation, intermittent V pacing  ECG atrial fibrillation, intermittent V pacing  ASSESSMENT AND PLAN  Acute on chronic diastolic heart failure, likely precipitated by persistent atrial fibrillation. On anticoagulation, reports 100% compliance. DCCV on Monday. Change to PO diuretics. Replace K. Review echo.   Sanda Klein, MD, [facility] [phone] office [phone] pager 06/09/2015 10:32 AM

## 2015-06-10 ENCOUNTER — Encounter (HOSPITAL_COMMUNITY): Payer: Self-pay | Admitting: Physician Assistant

## 2015-06-10 DIAGNOSIS — N189 Chronic kidney disease, unspecified: Secondary | ICD-10-CM | POA: Diagnosis present

## 2015-06-10 DIAGNOSIS — E876 Hypokalemia: Secondary | ICD-10-CM | POA: Diagnosis present

## 2015-06-10 DIAGNOSIS — I495 Sick sinus syndrome: Secondary | ICD-10-CM

## 2015-06-10 DIAGNOSIS — Z950 Presence of cardiac pacemaker: Secondary | ICD-10-CM

## 2015-06-10 DIAGNOSIS — N183 Chronic kidney disease, stage 3 (moderate): Secondary | ICD-10-CM

## 2015-06-10 DIAGNOSIS — N179 Acute kidney failure, unspecified: Secondary | ICD-10-CM | POA: Diagnosis present

## 2015-06-10 LAB — BASIC METABOLIC PANEL
ANION GAP: 10 (ref 5–15)
BUN: 27 mg/dL — AB (ref 6–20)
CO2: 23 mmol/L (ref 22–32)
Calcium: 9.5 mg/dL (ref 8.9–10.3)
Chloride: 108 mmol/L (ref 101–111)
Creatinine, Ser: 1.12 mg/dL — ABNORMAL HIGH (ref 0.44–1.00)
GFR calc Af Amer: 50 mL/min — ABNORMAL LOW (ref >60)
GFR calc non Af Amer: 43 mL/min — ABNORMAL LOW (ref >60)
Glucose, Bld: 99 mg/dL (ref 65–99)
Potassium: 4.6 mmol/L (ref 3.5–5.1)
SODIUM: 141 mmol/L (ref 135–145)

## 2015-06-10 LAB — GLUCOSE, CAPILLARY
GLUCOSE-CAPILLARY: 134 mg/dL — AB (ref 65–99)
Glucose-Capillary: 109 mg/dL — ABNORMAL HIGH (ref 65–99)
Glucose-Capillary: 146 mg/dL — ABNORMAL HIGH (ref 65–99)
Glucose-Capillary: 123 mg/dL — ABNORMAL HIGH (ref 65–99)

## 2015-06-10 MED ORDER — SODIUM CHLORIDE 0.9 % IJ SOLN
3.0000 mL | Freq: Two times a day (BID) | INTRAMUSCULAR | Status: DC
  Administered 2015-06-10 – 2015-06-11 (×3): 3 mL via INTRAVENOUS

## 2015-06-10 MED ORDER — GABAPENTIN 300 MG PO CAPS: 300.0000 mg | ORAL_CAPSULE | Freq: Every day | ORAL | Status: DC

## 2015-06-10 MED ORDER — HYDRALAZINE HCL 25 MG PO TABS
25.0000 mg | ORAL_TABLET | Freq: Three times a day (TID) | ORAL | Status: DC
  Administered 2015-06-10 – 2015-06-11 (×3): 25 mg via ORAL
  Filled 2015-06-10 (×6): qty 1

## 2015-06-10 MED ORDER — HYDROCORTISONE 1 % EX CREA
1.0000 "application " | TOPICAL_CREAM | Freq: Three times a day (TID) | CUTANEOUS | Status: DC | PRN
  Filled 2015-06-10: qty 28

## 2015-06-10 MED ORDER — SODIUM CHLORIDE 0.9 % IV SOLN
250.0000 mL | INTRAVENOUS | Status: DC
  Administered 2015-06-11: 500 mL via INTRAVENOUS

## 2015-06-10 MED ORDER — SODIUM CHLORIDE 0.9 % IJ SOLN: 3.0000 mL | INTRAMUSCULAR | Status: DC | PRN

## 2015-06-10 MED ORDER — GABAPENTIN 300 MG PO CAPS
300.0000 mg | ORAL_CAPSULE | Freq: Two times a day (BID) | ORAL | Status: DC
  Administered 2015-06-10 (×2): 300 mg via ORAL
  Filled 2015-06-10 (×5): qty 1

## 2015-06-10 NOTE — Progress Notes (Signed)
Patient Name: Jessica [name] Date of Encounter: 06/10/2015  Patient Care Team: Darlin Coco, MD as PCP - General (Cardiology) Thompson Grayer, MD as Consulting Physician (Clinical Cardiac Electrophysiology)   PROBLEM LIST  Principal Problem:   Acute diastolic CHF (congestive heart failure) Active Problems:   Persistent atrial fibrillation   Pacemaker-Medtronic   Hypertension   SSS (sick sinus syndrome)   CAD (coronary artery disease)   Hypokalemia   CKD (chronic kidney disease) stage 3, GFR 30-59 ml/min     SUBJECTIVE  No chest pain. Breathing remains improved. LE edema reduced.   CURRENT MEDS . apixaban  5 mg Oral BID  . calcium carbonate  1 tablet Oral Q breakfast  . carvedilol  25 mg Oral BID  . furosemide  40 mg Oral Daily  . gabapentin  300 mg Oral QHS  . hydrALAZINE  10 mg Oral 3 times per day  . insulin aspart  0-9 Units Subcutaneous TID WC  . lisinopril  20 mg Oral BID  . omega-3 acid ethyl esters  1 g Oral Daily  . pyridOXINE  100 mg Oral Daily  . rosuvastatin  10 mg Oral Daily  . sodium chloride  3 mL Intravenous Q12H    OBJECTIVE  Filed Vitals:   06/09/15 1019 06/09/15 1459 06/09/15 2016 06/10/15 0443  BP: 134/76 121/55 162/65 177/67  Pulse: 74 74 64 67  Temp: 97.6 F (36.4 C) 98 F (36.7 C) 97.3 F (36.3 C) 97.7 F (36.5 C)  TempSrc: Oral Oral Oral Oral  Resp: 18 20 18 18   Height:      Weight:    150 lb 5.7 oz (68.2 kg)  SpO2: 95% 100% 98% 98%    Intake/Output Summary (Last 24 hours) at 06/10/15 0729 Last data filed at 06/09/15 2240  Gross per 24 hour  Intake   1260 ml  Output      0 ml  Net   1260 ml   Filed Weights   06/08/15 1833 06/09/15 0408 06/10/15 0443  Weight: 162 lb 11.2 oz (73.8 kg) 153 lb 14.1 oz (69.8 kg) 150 lb 5.7 oz (68.2 kg)    PHYSICAL EXAM  GEN: Well nourished, well developed, in no acute distress. HEENT: normal. Neck: Supple, JVP 6-7 cm  Cardiac: Irregularly irregular, no murmurs, rubs, or gallops. Trace  bilateral ankle edema.     Respiratory:  Decreased breath sounds bilaterally, bibasilar crackles are clear with cough, no wheezing GI: Soft, nontender, nondistended MS: no deformity or atrophy. Skin: warm and dry, no rash. Neuro:  Strength and sensation are intact. Psych: Normal affect.  Accessory Clinical Findings  CBC  Recent Labs  06/08/15 1241 06/09/15 0330  WBC 7.5 9.6  NEUTROABS  --  7.3  HGB 11.3* 11.9*  HCT 33.1* 34.5*  MCV 89.7 88.5  PLT 236 916   Basic Metabolic Panel  Recent Labs  06/09/15 0330 06/10/15 0510  NA 142 141  K 3.3* 4.6  CL 106 108  CO2 27 23  GLUCOSE 117* 99  BUN 22* 27*  CREATININE 1.15* 1.12*  CALCIUM 9.7 9.5   Liver Function Tests  Recent Labs  06/08/15 1951  AST 24  ALT 21  ALKPHOS 100  BILITOT 0.8  PROT 6.7  ALBUMIN 3.7   No results for input(s): LIPASE, AMYLASE in the last 72 hours. Cardiac Enzymes No results for input(s): CKTOTAL, CKMB, CKMBINDEX, TROPONINI in the last 72 hours. BNP (last 3 results)  Recent Labs  06/08/15 1353  BNP  828.8*   D-Dimer No results for input(s): DDIMER in the last 72 hours. Hemoglobin A1C No results for input(s): HGBA1C in the last 72 hours. Fasting Lipid Panel  Recent Labs  06/09/15 0330  CHOL 101  HDL 38*  LDLCALC 53  TRIG 52  CHOLHDL 2.7   Thyroid Function Tests No results for input(s): TSH, T4TOTAL, T3FREE, THYROIDAB in the last 72 hours.  Invalid input(s): FREET3  TELE  Atrial fibrillation, intermittently paced  ECG    RADIOLOGY/STUDIES  Dg Chest 2 View  06/08/2015   CLINICAL DATA:  Shortness of breath for 1 week.  EXAM: CHEST - 2 VIEW  COMPARISON:  One-view chest x-ray 12/23/2014  FINDINGS: The heart is enlarged. Atherosclerotic calcifications are again noted at the aortic arch. Mild edema is present. A dual lead pacemaker is in place. A small left pleural effusion is noted. Bibasilar atelectasis is more prominent on the left.  The proximal right humerus fracture  has healed.  IMPRESSION: 1. Cardiomegaly with mild edema and a left pleural effusion compatible with congestive heart failure. 2. Pacing wires are stable. 3. Atherosclerosis of the thoracic aorta.   Electronically Signed   By: San Morelle M.D.   On: 06/08/2015 13:02   ECHO 06/09/15 - Left ventricle: The cavity size was normal. Systolic function was normal. The estimated ejection fraction was in the range of 55% to 60%. Wall motion was normal; there were no regional wall motion abnormalities. Doppler parameters are consistent with elevated mean left atrial filling pressure. - Aortic valve: There was mild regurgitation. - Mitral valve: There was mild to moderate regurgitation directed centrally. - Left atrium: The atrium was mildly dilated. - Right ventricle: Systolic function was mildly reduced. - Right atrium: The atrium was mildly dilated. - Tricuspid valve: There was moderate regurgitation. - Pulmonary arteries: Systolic pressure was moderately increased. PA peak pressure: 53 mm Hg (S).  PATIENT SUMMARY  81 year old female with CAD status post DES 2 to the RCA in 4627, diastolic HF, PAF, CKD stage III, pulmonary hypertension, pacemaker implantation in 2011 for sick sinus syndrome, diabetes, HTN, HL. Admitted 06/08/15 with acute diastolic HF and elevated blood pressure. Interrogation of her pacemaker indicates recurrence of atrial fibrillation since July 16. It is felt that recurrent A. fib is likely the cause of decompensation with acute HF. Echocardiogram demonstrates normal LV function, biatrial enlargement, moderate TR, mild to moderate MR and PASP 53 mmHg. Plan is for cardioversion tomorrow.  ASSESSMENT AND PLAN  1.  Acute on Chronic Diastolic CHF:  Volume improved. Patient has been incontinent. I/Os hard to interpret. Weight is down 12 pounds. Continue current dose of Lasix. Obtain BMET tomorrow morning.   2.  Persistent Atrial Fibrillation:   HR controlled. Continue  Eliquis. She admits to compliance prior to admission. Plan DCCV tomorrow.  3.  Chronic Kidney Disease:  Creatinine stable.  repeat BMET tomorrow morning. 4.  Hypokalemia:  K+ improved today 5.  HTN:  Blood pressure remains elevated. Increase hydralazine to 25 mg 3 times a day 6.  CAD:  CEs negative. 7.  S/p Pacemaker:  Intermittently V pacing.    Signed, Richardson Dopp, PA-C  06/10/2015, 7:29 AM     I have seen and examined the patient along with Richardson Dopp, PA-C .  I have reviewed the chart, notes and new data.  I agree with PAP's note.  Key new complaints: pleasant, confused, calm and comfortable Key examination changes: irregular rhythm Key new findings / data: echo still showed evidence  of high filling pressures yesterday, even after substantial diuresis  PLAN: Cardioversion tomorrow. This procedure has been fully reviewed with the patient and family and informed consent has been obtained. Agree with small increase in hydralazine.   Sanda Klein, MD, [place] [phone] 06/10/2015, 8:30 AM

## 2015-06-11 ENCOUNTER — Encounter (HOSPITAL_COMMUNITY)
Admission: EM | Disposition: A | Discharge status: Home / Self Care | Payer: Self-pay | Source: Home / Self Care | Attending: Cardiovascular Disease

## 2015-06-11 ENCOUNTER — Inpatient Hospital Stay (HOSPITAL_COMMUNITY): Payer: Medicare Other | Admitting: Certified Registered Nurse Anesthetist

## 2015-06-11 ENCOUNTER — Encounter (HOSPITAL_COMMUNITY): Payer: Self-pay | Admitting: *Deleted

## 2015-06-11 DIAGNOSIS — I4891 Unspecified atrial fibrillation: Secondary | ICD-10-CM

## 2015-06-11 DIAGNOSIS — I480 Paroxysmal atrial fibrillation: Secondary | ICD-10-CM

## 2015-06-11 HISTORY — PX: CARDIOVERSION: SHX1299

## 2015-06-11 LAB — BASIC METABOLIC PANEL
ANION GAP: 8 (ref 5–15)
BUN: 31 mg/dL — ABNORMAL HIGH (ref 6–20)
CO2: 27 mmol/L (ref 22–32)
Calcium: 9.4 mg/dL (ref 8.9–10.3)
Chloride: 103 mmol/L (ref 101–111)
Creatinine, Ser: 1.25 mg/dL — ABNORMAL HIGH (ref 0.44–1.00)
GFR calc Af Amer: 44 mL/min — ABNORMAL LOW (ref >60)
GFR calc non Af Amer: 38 mL/min — ABNORMAL LOW (ref >60)
Glucose, Bld: 126 mg/dL — ABNORMAL HIGH (ref 65–99)
Potassium: 3.5 mmol/L (ref 3.5–5.1)
SODIUM: 138 mmol/L (ref 135–145)

## 2015-06-11 LAB — GLUCOSE, CAPILLARY
GLUCOSE-CAPILLARY: 112 mg/dL — AB (ref 65–99)
Glucose-Capillary: 114 mg/dL — ABNORMAL HIGH (ref 65–99)
Glucose-Capillary: 196 mg/dL — ABNORMAL HIGH (ref 65–99)

## 2015-06-11 SURGERY — CARDIOVERSION
Anesthesia: Monitor Anesthesia Care

## 2015-06-11 MED ORDER — CARVEDILOL 25 MG PO TABS: 25.0000 mg | ORAL_TABLET | Freq: Two times a day (BID) | ORAL | Status: DC

## 2015-06-11 MED ORDER — GABAPENTIN 300 MG PO CAPS: 300.0000 mg | ORAL_CAPSULE | Freq: Two times a day (BID) | ORAL | Status: DC

## 2015-06-11 MED ORDER — PROPOFOL 10 MG/ML IV BOLUS
INTRAVENOUS | Status: DC | PRN
  Administered 2015-06-11: 60 mg via INTRAVENOUS

## 2015-06-11 MED ORDER — LIDOCAINE HCL (CARDIAC) 20 MG/ML IV SOLN
INTRAVENOUS | Status: DC | PRN
  Administered 2015-06-11: 60 mg via INTRAVENOUS

## 2015-06-11 MED ORDER — HYDRALAZINE HCL 25 MG PO TABS: 25.0000 mg | ORAL_TABLET | Freq: Three times a day (TID) | ORAL | Status: DC

## 2015-06-11 MED ORDER — SODIUM CHLORIDE 0.9 % IV SOLN
INTRAVENOUS | Status: DC | PRN
  Administered 2015-06-11: 13:00:00 via INTRAVENOUS

## 2015-06-11 NOTE — H&P (View-Only) (Signed)
Patient Name: Jessica [name] Date of Encounter: 06/10/2015  Patient Care Team: Darlin Coco, MD as PCP - General (Cardiology) Thompson Grayer, MD as Consulting Physician (Clinical Cardiac Electrophysiology)   PROBLEM LIST  Principal Problem:   Acute diastolic CHF (congestive heart failure) Active Problems:   Persistent atrial fibrillation   Pacemaker-Medtronic   Hypertension   SSS (sick sinus syndrome)   CAD (coronary artery disease)   Hypokalemia   CKD (chronic kidney disease) stage 3, GFR 30-59 ml/min     SUBJECTIVE  No chest pain. Breathing remains improved. LE edema reduced.   CURRENT MEDS . apixaban  5 mg Oral BID  . calcium carbonate  1 tablet Oral Q breakfast  . carvedilol  25 mg Oral BID  . furosemide  40 mg Oral Daily  . gabapentin  300 mg Oral QHS  . hydrALAZINE  10 mg Oral 3 times per day  . insulin aspart  0-9 Units Subcutaneous TID WC  . lisinopril  20 mg Oral BID  . omega-3 acid ethyl esters  1 g Oral Daily  . pyridOXINE  100 mg Oral Daily  . rosuvastatin  10 mg Oral Daily  . sodium chloride  3 mL Intravenous Q12H    OBJECTIVE  Filed Vitals:   06/09/15 1019 06/09/15 1459 06/09/15 2016 06/10/15 0443  BP: 134/76 121/55 162/65 177/67  Pulse: 74 74 64 67  Temp: 97.6 F (36.4 C) 98 F (36.7 C) 97.3 F (36.3 C) 97.7 F (36.5 C)  TempSrc: Oral Oral Oral Oral  Resp: 18 20 18 18   Height:      Weight:    150 lb 5.7 oz (68.2 kg)  SpO2: 95% 100% 98% 98%    Intake/Output Summary (Last 24 hours) at 06/10/15 0729 Last data filed at 06/09/15 2240  Gross per 24 hour  Intake   1260 ml  Output      0 ml  Net   1260 ml   Filed Weights   06/08/15 1833 06/09/15 0408 06/10/15 0443  Weight: 162 lb 11.2 oz (73.8 kg) 153 lb 14.1 oz (69.8 kg) 150 lb 5.7 oz (68.2 kg)    PHYSICAL EXAM  GEN: Well nourished, well developed, in no acute distress. HEENT: normal. Neck: Supple, JVP 6-7 cm  Cardiac: Irregularly irregular, no murmurs, rubs, or gallops. Trace  bilateral ankle edema.     Respiratory:  Decreased breath sounds bilaterally, bibasilar crackles are clear with cough, no wheezing GI: Soft, nontender, nondistended MS: no deformity or atrophy. Skin: warm and dry, no rash. Neuro:  Strength and sensation are intact. Psych: Normal affect.  Accessory Clinical Findings  CBC  Recent Labs  06/08/15 1241 06/09/15 0330  WBC 7.5 9.6  NEUTROABS  --  7.3  HGB 11.3* 11.9*  HCT 33.1* 34.5*  MCV 89.7 88.5  PLT 236 893   Basic Metabolic Panel  Recent Labs  06/09/15 0330 06/10/15 0510  NA 142 141  K 3.3* 4.6  CL 106 108  CO2 27 23  GLUCOSE 117* 99  BUN 22* 27*  CREATININE 1.15* 1.12*  CALCIUM 9.7 9.5   Liver Function Tests  Recent Labs  06/08/15 1951  AST 24  ALT 21  ALKPHOS 100  BILITOT 0.8  PROT 6.7  ALBUMIN 3.7   No results for input(s): LIPASE, AMYLASE in the last 72 hours. Cardiac Enzymes No results for input(s): CKTOTAL, CKMB, CKMBINDEX, TROPONINI in the last 72 hours. BNP (last 3 results)  Recent Labs  06/08/15 1353  BNP  828.8*   D-Dimer No results for input(s): DDIMER in the last 72 hours. Hemoglobin A1C No results for input(s): HGBA1C in the last 72 hours. Fasting Lipid Panel  Recent Labs  06/09/15 0330  CHOL 101  HDL 38*  LDLCALC 53  TRIG 52  CHOLHDL 2.7   Thyroid Function Tests No results for input(s): TSH, T4TOTAL, T3FREE, THYROIDAB in the last 72 hours.  Invalid input(s): FREET3  TELE  Atrial fibrillation, intermittently paced  ECG    RADIOLOGY/STUDIES  Dg Chest 2 View  06/08/2015   CLINICAL DATA:  Shortness of breath for 1 week.  EXAM: CHEST - 2 VIEW  COMPARISON:  One-view chest x-ray 12/23/2014  FINDINGS: The heart is enlarged. Atherosclerotic calcifications are again noted at the aortic arch. Mild edema is present. A dual lead pacemaker is in place. A small left pleural effusion is noted. Bibasilar atelectasis is more prominent on the left.  The proximal right humerus fracture  has healed.  IMPRESSION: 1. Cardiomegaly with mild edema and a left pleural effusion compatible with congestive heart failure. 2. Pacing wires are stable. 3. Atherosclerosis of the thoracic aorta.   Electronically Signed   By: San Morelle M.D.   On: 06/08/2015 13:02   ECHO 06/09/15 - Left ventricle: The cavity size was normal. Systolic function was normal. The estimated ejection fraction was in the range of 55% to 60%. Wall motion was normal; there were no regional wall motion abnormalities. Doppler parameters are consistent with elevated mean left atrial filling pressure. - Aortic valve: There was mild regurgitation. - Mitral valve: There was mild to moderate regurgitation directed centrally. - Left atrium: The atrium was mildly dilated. - Right ventricle: Systolic function was mildly reduced. - Right atrium: The atrium was mildly dilated. - Tricuspid valve: There was moderate regurgitation. - Pulmonary arteries: Systolic pressure was moderately increased. PA peak pressure: 53 mm Hg (S).  PATIENT SUMMARY  81 year old female with CAD status post DES 2 to the RCA in 0254, diastolic HF, PAF, CKD stage III, pulmonary hypertension, pacemaker implantation in 2011 for sick sinus syndrome, diabetes, HTN, HL. Admitted 06/08/15 with acute diastolic HF and elevated blood pressure. Interrogation of her pacemaker indicates recurrence of atrial fibrillation since July 16. It is felt that recurrent A. fib is likely the cause of decompensation with acute HF. Echocardiogram demonstrates normal LV function, biatrial enlargement, moderate TR, mild to moderate MR and PASP 53 mmHg. Plan is for cardioversion tomorrow.  ASSESSMENT AND PLAN  1.  Acute on Chronic Diastolic CHF:  Volume improved. Patient has been incontinent. I/Os hard to interpret. Weight is down 12 pounds. Continue current dose of Lasix. Obtain BMET tomorrow morning.   2.  Persistent Atrial Fibrillation:   HR controlled. Continue  Eliquis. She admits to compliance prior to admission. Plan DCCV tomorrow.  3.  Chronic Kidney Disease:  Creatinine stable.  repeat BMET tomorrow morning. 4.  Hypokalemia:  K+ improved today 5.  HTN:  Blood pressure remains elevated. Increase hydralazine to 25 mg 3 times a day 6.  CAD:  CEs negative. 7.  S/p Pacemaker:  Intermittently V pacing.    Signed, Richardson Dopp, PA-C  06/10/2015, 7:29 AM     I have seen and examined the patient along with Richardson Dopp, PA-C .  I have reviewed the chart, notes and new data.  I agree with PAP's note.  Key new complaints: pleasant, confused, calm and comfortable Key examination changes: irregular rhythm Key new findings / data: echo still showed evidence  of high filling pressures yesterday, even after substantial diuresis  PLAN: Cardioversion tomorrow. This procedure has been fully reviewed with the patient and family and informed consent has been obtained. Agree with small increase in hydralazine.   Sanda Klein, MD, [place] [phone] 06/10/2015, 8:30 AM

## 2015-06-11 NOTE — Addendum Note (Signed)
Addendum  created 06/11/15 1348 by Lowella Dell, CRNA   Modules edited: Anesthesia Blocks and Procedures, Clinical Notes   Clinical Notes:  File: [id number]

## 2015-06-11 NOTE — Interval H&P Note (Signed)
History and Physical Interval Note:  06/11/2015 11:47 AM  [name]  has presented today for surgery, with the diagnosis of afib  The various methods of treatment have been discussed with the patient and family. After consideration of risks, benefits and other options for treatment, the patient has consented to  Procedure(s): CARDIOVERSION (N/A) as a surgical intervention .  The patient's history has been reviewed, patient examined, no change in status, stable for surgery.  I have reviewed the patient's chart and labs.  Questions were answered to the patient's satisfaction.     [name]

## 2015-06-11 NOTE — Anesthesia Postprocedure Evaluation (Signed)
  Anesthesia Post-op Note  Patient: Jessica [name]  Procedure(s) Performed: Procedure(s): CARDIOVERSION (N/A)  Patient Location: Endoscopy Unit  Anesthesia Type:MAC  Level of Consciousness: awake and alert   Airway and Oxygen Therapy: Patient Spontanous Breathing  Post-op Pain: none  Post-op Assessment: Post-op Vital signs reviewed              Post-op Vital Signs: Reviewed and stable  Last Vitals:  Filed Vitals:   06/11/15 1335  BP: 94/34  Pulse: 59  Temp:   Resp: 13    Complications: No apparent anesthesia complications

## 2015-06-11 NOTE — Anesthesia Procedure Notes (Addendum)
Date/Time: 06/11/2015 1:05 PM Performed by: Willeen Cass P Pre-anesthesia Checklist: Patient identified, Emergency Drugs available, Suction available, Patient being monitored and Timeout performed Patient Re-evaluated:Patient Re-evaluated prior to inductionOxygen Delivery Method: Ambu bag Preoxygenation: Pre-oxygenation with 100% oxygen Intubation Type: IV induction Ventilation: Mask ventilation without difficulty Dental Injury: Teeth and Oropharynx as per pre-operative assessment

## 2015-06-11 NOTE — Anesthesia Preprocedure Evaluation (Addendum)
Anesthesia Evaluation  Patient identified by MRN, date of birth, ID band Patient awake    Reviewed: Allergy & Precautions, NPO status , Patient's Chart, lab work & pertinent test results  Airway Mallampati: I       Dental  (+) Upper Dentures, Lower Dentures   Pulmonary neg pulmonary ROS,  breath sounds clear to auscultation        Cardiovascular Exercise Tolerance: Poor hypertension, Pt. on medications + CAD and +CHF + dysrhythmias Atrial Fibrillation + pacemaker Rhythm:Irregular Rate:Abnormal     Neuro/Psych negative neurological ROS  negative psych ROS   GI/Hepatic Neg liver ROS, GERD-  ,  Endo/Other  diabetes, Type 2  Renal/GU CRFRenal disease  negative genitourinary   Musculoskeletal  (+) Arthritis -,   Abdominal   Peds negative pediatric ROS (+)  Hematology negative hematology ROS (+)   Anesthesia Other Findings   Reproductive/Obstetrics negative OB ROS                            Lab Results  Component Value Date   WBC 9.6 06/09/2015   HGB 11.9* 06/09/2015   HCT 34.5* 06/09/2015   MCV 88.5 06/09/2015   PLT 267 06/09/2015   Lab Results  Component Value Date   CREATININE 1.25* 06/11/2015   BUN 31* 06/11/2015   NA 138 06/11/2015   K 3.5 06/11/2015   CL 103 06/11/2015   CO2 27 06/11/2015   Lab Results  Component Value Date   INR 0.95 07/15/2010   INR 0.9 07/04/2009    Anesthesia Physical Anesthesia Plan  ASA: III  Anesthesia Plan: MAC   Post-op Pain Management:    Induction:   Airway Management Planned: Natural Airway  Additional Equipment:   Intra-op Plan:   Post-operative Plan:   Informed Consent: I have reviewed the patients History and Physical, chart, labs and discussed the procedure including the risks, benefits and alternatives for the proposed anesthesia with the patient or authorized representative who has indicated his/her understanding and  acceptance.   Dental advisory given  Plan Discussed with: CRNA  Anesthesia Plan Comments:        Anesthesia Quick Evaluation

## 2015-06-11 NOTE — Care Management Important Message (Signed)
Important Message  Patient Details  Name: NANCYLEE GAINES MRN: [id number] Date of Birth: 11-Nov-1926   Medicare Important Message Given:  Yes-second notification given    Pricilla Handler 06/11/2015, 2:47 PM

## 2015-06-11 NOTE — CV Procedure (Signed)
    Cardioversion Note  Jessica [name] [id number] [birth year]  Procedure: DC Cardioversion Indications: atrial fibrillation.  Procedure Details Consent: Obtained Time Out: Verified patient identification, verified procedure, site/side was marked, verified correct patient position, special equipment/implants available, Radiology Safety Procedures followed,  medications/allergies/relevent history reviewed, required imaging and test results available.  Performed  The patient has been on adequate anticoagulation.  The patient received IV propofol administered by an anesthesiologist for sedation.  Synchronous cardioversion was performed at 120 joules.  The cardioversion was successful.   Complications: No apparent complications Patient did tolerate procedure well.   [name], MD, [facility] 06/11/2015, 11:46 AM

## 2015-06-11 NOTE — Progress Notes (Signed)
Patient Name: Jessica [name] Date of Encounter: 06/11/2015     Principal Problem:   Acute diastolic CHF (congestive heart failure) Active Problems:   Pacemaker-Medtronic   Hypertension   SSS (sick sinus syndrome)   Persistent atrial fibrillation   CAD (coronary artery disease)   Hypokalemia   CKD (chronic kidney disease) stage 3, GFR 30-59 ml/min    SUBJECTIVE  Doing well after successful DCCV today.  No chest pain.  Mild weakness which she attributes to diuresis with lasix.  CURRENT MEDS . apixaban  5 mg Oral BID  . calcium carbonate  1 tablet Oral Q breakfast  . carvedilol  25 mg Oral BID  . furosemide  40 mg Oral Daily  . gabapentin  300 mg Oral BID  . hydrALAZINE  25 mg Oral 3 times per day  . insulin aspart  0-9 Units Subcutaneous TID WC  . lisinopril  20 mg Oral BID  . omega-3 acid ethyl esters  1 g Oral Daily  . pyridOXINE  100 mg Oral Daily  . rosuvastatin  10 mg Oral Daily  . sodium chloride  3 mL Intravenous Q12H  . sodium chloride  3 mL Intravenous Q12H    OBJECTIVE  Filed Vitals:   06/11/15 1325 06/11/15 1330 06/11/15 1335 06/11/15 1400  BP: 100/30 104/35 94/34 109/39  Pulse: 61 60 59 64  Temp:      TempSrc:      Resp: 18 14 13    Height:      Weight:      SpO2: 100% 99% 98%     Intake/Output Summary (Last 24 hours) at 06/11/15 1444 Last data filed at 06/11/15 1312  Gross per 24 hour  Intake    970 ml  Output      0 ml  Net    970 ml   Filed Weights   06/09/15 0408 06/10/15 0443 06/11/15 0549  Weight: 153 lb 14.1 oz (69.8 kg) 150 lb 5.7 oz (68.2 kg) 149 lb 14.3 oz (67.991 kg)    PHYSICAL EXAM  General: Pleasant, NAD. Neuro: Alert and oriented X 3. Moves all extremities spontaneously. Psych: Normal affect. HEENT:  Normal  Neck: Supple without bruits or JVD. Lungs:  Resp regular and unlabored, CTA. Heart: RRR no s3, s4, or murmurs. Abdomen: Soft, non-tender, non-distended, BS + x 4.  Extremities: No clubbing, cyanosis or edema.  DP/PT/Radials 2+ and equal bilaterally.  Accessory Clinical Findings  CBC  Recent Labs  06/09/15 0330  WBC 9.6  NEUTROABS 7.3  HGB 11.9*  HCT 34.5*  MCV 88.5  PLT 956   Basic Metabolic Panel  Recent Labs  06/10/15 0510 06/11/15 0403  NA 141 138  K 4.6 3.5  CL 108 103  CO2 23 27  GLUCOSE 99 126*  BUN 27* 31*  CREATININE 1.12* 1.25*  CALCIUM 9.5 9.4   Liver Function Tests  Recent Labs  06/08/15 1951  AST 24  ALT 21  ALKPHOS 100  BILITOT 0.8  PROT 6.7  ALBUMIN 3.7   No results for input(s): LIPASE, AMYLASE in the last 72 hours. Cardiac Enzymes No results for input(s): CKTOTAL, CKMB, CKMBINDEX, TROPONINI in the last 72 hours. BNP Invalid input(s): POCBNP D-Dimer No results for input(s): DDIMER in the last 72 hours. Hemoglobin A1C No results for input(s): HGBA1C in the last 72 hours. Fasting Lipid Panel  Recent Labs  06/09/15 0330  CHOL 101  HDL 38*  LDLCALC 53  TRIG 52  CHOLHDL 2.7  Thyroid Function Tests No results for input(s): TSH, T4TOTAL, T3FREE, THYROIDAB in the last 72 hours.  Invalid input(s): FREET3  TELE  Atrial pacing.  ECG    Radiology/Studies  Dg Chest 2 View  06/08/2015   CLINICAL DATA:  Shortness of breath for 1 week.  EXAM: CHEST - 2 VIEW  COMPARISON:  One-view chest x-ray 12/23/2014  FINDINGS: The heart is enlarged. Atherosclerotic calcifications are again noted at the aortic arch. Mild edema is present. A dual lead pacemaker is in place. A small left pleural effusion is noted. Bibasilar atelectasis is more prominent on the left.  The proximal right humerus fracture has healed.  IMPRESSION: 1. Cardiomegaly with mild edema and a left pleural effusion compatible with congestive heart failure. 2. Pacing wires are stable. 3. Atherosclerosis of the thoracic aorta.   Electronically Signed   By: San Morelle M.D.   On: 06/08/2015 13:02    ASSESSMENT AND PLAN  Paroxysmal atrial fibrillation now back in NSR after  DCCV. Acute diastolic heart failure, resolved.  Plan: Okay for discharge today. Will stop Lasix and resume HCTZ at discharge. She feels lasix is too strong for her. She already has appt to see me 06/26/15.  Signed, [name] MD

## 2015-06-11 NOTE — Discharge Summary (Signed)
Physician Discharge Summary       Patient ID: Jessica [name] MRN: [id number] DOB/AGE: 01/30/1927 81 y.o.  Admit date: 06/08/2015 Discharge date: 06/11/2015 Primary Cardiologist:Dr. [name]   Discharge Diagnoses:  Principal Problem:   Acute diastolic CHF (congestive heart failure) Active Problems:   Pacemaker-Medtronic   Hypertension   SSS (sick sinus syndrome)   Persistent atrial fibrillation   CAD (coronary artery disease)   Hypokalemia   CKD (chronic kidney disease) stage 3, GFR 30-59 ml/min   Discharged Condition: good  Procedures:   Hospital Course:  81 y/o female with CAD (DESx2 to mid and distal RCA in 2010), DM with peripheral neuropathy, HTN, HLD, SSS s/p Medtronic pacemaker 2011, hypertensive heart disease, diastolic dysfunction without prior CHF, PAF (discovered on device interrogation 10/2014), probable CKD stage III (based on Cr 1.0-1.1), moderate pulm HTN (by last echo) who presented to [facility] with SOB and edema.Last echo 02/2013: mild LVH, EF 55%, mild LVH, grade 2 DD, aortic sclerosis without stenosis, mild MR, mild LAE, moderate pulm HTN - PASP 71mHg.  For the past week she has noticed progressive SOB, LEE, orthopnea, and a 10lb weight gain. Her mobility is somewhat limited but she says she still gets around [place] She walks with a cane. She fell in February and broke her arm but no other recent falls. No chest pain. She does admit to eating a decent amount of salt but has tried to cut down over ever since she started feeling bad. Drinks somewhere between 36-64 oz of water per day. Reports med compliance. Due to persistence of symptoms, she came to the ER. BP up in the ER - peak 192/82. She took home meds today. Her daughter says BP usually runs in the 140s. In the ER, Hgb 11.3 (prev 10.8-12.5), BUN/Cr 23/1.03, troponin neg x 1. CXR: cardiomegaly, mild edema and small left pleural effusion; atherosclerosis of the thoracic aorta. She is in rate controlled AF on  admit.  Her pacer interrogation revealed she has been in a fib since July 16th but rate controlled.  .   She was admitted and diuresed now unsure accuracy of I&O + 2760- pt with incontinence but wt down from 162 on admit to 149 lbs.  Once her volume improved she underwent DCCV.  This was successful at 120 joules to SR.  She recovered and was sen and found stable for discharge by Dr. BMare Ferrari  She will follow up with him as previously instructed.   Consults: None  Significant Diagnostic Studies:  BMP Latest Ref Rng 06/11/2015 06/10/2015 06/09/2015  Glucose 65 - 99 mg/dL 126(H) 99 117(H)  BUN 6 - 20 mg/dL 31(H) 27(H) 22(H)  Creatinine 0.44 - 1.00 mg/dL 1.25(H) 1.12(H) 1.15(H)  Sodium 135 - 145 mmol/L 138 141 142  Potassium 3.5 - 5.1 mmol/L 3.5 4.6 3.3(L)  Chloride 101 - 111 mmol/L 103 108 106  CO2 22 - 32 mmol/L 27 23 27   Calcium 8.9 - 10.3 mg/dL 9.4 9.5 9.7     CBC Latest Ref Rng 06/09/2015 06/08/2015 10/10/2013  WBC 4.0 - 10.5 K/uL 9.6 7.5 8.4  Hemoglobin 12.0 - 15.0 g/dL 11.9(L) 11.3(L) 12.5  Hematocrit 36.0 - 46.0 % 34.5(L) 33.1(L) 38.1  Platelets 150 - 400 K/uL 267 236 284.0   Hepatic Function Latest Ref Rng 06/08/2015 12/12/2014 04/07/2014  Total Protein 6.5 - 8.1 g/dL 6.7 6.6 6.2  Albumin 3.5 - 5.0 g/dL 3.7 3.9 3.7  AST 15 - 41 U/L 24 21 20   ALT 14 -  54 U/L 21 18 16   Alk Phosphatase 38 - 126 U/L 100 61 56  Total Bilirubin 0.3 - 1.2 mg/dL 0.8 0.6 0.7  Bilirubin, Direct 0.1 - 0.5 mg/dL 0.3 0.1 0.2    BNP    Component Value Date/Time   BNP 828.8* 06/08/2015 1353    Lipid Panel     Component Value Date/Time   CHOL 101 06/09/2015 0330   TRIG 52 06/09/2015 0330   HDL 38* 06/09/2015 0330   CHOLHDL 2.7 06/09/2015 0330   VLDL 10 06/09/2015 0330   LDLCALC 53 06/09/2015 0330   LDLDIRECT 152.0 10/10/2013 1619     CHEST - 2 VIEW COMPARISON: One-view chest x-ray 12/23/2014 FINDINGS: The heart is enlarged. Atherosclerotic calcifications are again noted at the aortic arch. Mild  edema is present. A dual lead pacemaker is in place. A small left pleural effusion is noted. Bibasilar atelectasis is more prominent on the left.  The proximal right humerus fracture has healed.  IMPRESSION: 1. Cardiomegaly with mild edema and a left pleural effusion compatible with congestive heart failure. 2. Pacing wires are stable. 3. Atherosclerosis of the thoracic aorta.         ECHO: Study Conclusions - Left ventricle: The cavity size was normal. Systolic function was normal. The estimated ejection fraction was in the range of 55% to 60%. Wall motion was normal; there were no regional wall motion abnormalities. Doppler parameters are consistent with elevated mean left atrial filling pressure. - Aortic valve: There was mild regurgitation. - Mitral valve: There was mild to moderate regurgitation directed centrally. - Left atrium: The atrium was mildly dilated. - Right ventricle: Systolic function was mildly reduced. - Right atrium: The atrium was mildly dilated. - Tricuspid valve: There was moderate regurgitation. - Pulmonary arteries: Systolic pressure was moderately increased. PA peak pressure: 53 mm Hg (S).  Discharge Exam: Blood pressure 109/39, pulse 64, temperature 97.7 F (36.5 C), temperature source Oral, resp. rate 13, height 5' 2"  (1.575 m), weight 149 lb 14.3 oz (67.991 kg), SpO2 98 %.  Disposition: 01-Home or Self Care     Medication List    TAKE these medications        apixaban 5 MG Tabs tablet  Commonly known as:  ELIQUIS  Take 1 tablet (5 mg total) by mouth 2 (two) times daily.     calcium carbonate 600 MG Tabs tablet  Commonly known as:  OS-CAL  Take 600 mg by mouth daily with breakfast.     carvedilol 25 MG tablet  Commonly known as:  COREG  Take 1 tablet (25 mg total) by mouth 2 (two) times daily.     ECHINACEA ACZ PO  Take 1 tablet by mouth daily.     fish oil-omega-3 fatty acids 1000 MG capsule  Take 1 g by mouth  every morning.     gabapentin 300 MG capsule  Commonly known as:  NEURONTIN  Take 1 capsule (300 mg total) by mouth 2 (two) times daily.     hydrALAZINE 25 MG tablet  Commonly known as:  APRESOLINE  Take 1 tablet (25 mg total) by mouth every 8 (eight) hours.     hydrochlorothiazide 12.5 MG capsule  Commonly known as:  MICROZIDE  TAKE ONE CAPSULE BY MOUTH ONCE DAILY     lisinopril 20 MG tablet  Commonly known as:  PRINIVIL,ZESTRIL  Take 1 tablet (20 mg total) by mouth 2 (two) times daily.     metFORMIN 500 MG tablet  Commonly known as:  GLUCOPHAGE  TAKE ONE TABLET BY MOUTH ONCE DAILY WITH  BREAKFAST     nitroGLYCERIN 0.4 MG SL tablet  Commonly known as:  NITROSTAT  Place 1 tablet (0.4 mg total) under the tongue every 5 (five) minutes as needed for chest pain. x3 doses as needed for chest pain     polyethylene glycol packet  Commonly known as:  MIRALAX / GLYCOLAX  Take 17 g by mouth daily as needed for moderate constipation.     pyridOXINE 100 MG tablet  Commonly known as:  VITAMIN B-6  Take 100 mg by mouth daily.     rosuvastatin 10 MG tablet  Commonly known as:  CRESTOR  TAKE ONE TABLET BY MOUTH ONCE DAILY IN THE MORNING     VITAMIN B12 PO  Take 1 tablet by mouth daily.           Follow-up Information    Follow up with [name], MD On 06/26/2015.   Specialty:  Cardiology   Why:  at 9:30 am   Contact information:   [address] [place] [address] [phone]        Discharge Instructions: Do not miss any of the Eliquis.  Heart Healthy diabetic diet.  Weigh daily Call [phone] if weight climbs more than 3 pounds in a day or 5 pounds in a week. No salt to very little salt in your diet.  No more than 2000 mg in a day. Call if increased shortness of breath or increased swelling.  Call if questions or problems     we did increase your hydralazine.  Note new dose.   Signed: Isaiah Serge Nurse Practitioner-Certified [facility]: [facility] 06/11/2015, 3:40 PM  Time spent on discharge :  >30 minutes.   Agree with above instructions and plan.

## 2015-06-11 NOTE — Progress Notes (Signed)
Pt has orders to be discharged. Discharge instructions given and pt has no additional questions at this time. Medication regimen reviewed and pt educated. Pt verbalized understanding and has no additional questions. Telemetry box removed. IV removed and site in good condition. Pt stable and waiting for transportation.   Jerriah Ines RN 

## 2015-06-11 NOTE — Discharge Instructions (Signed)
Do not miss any of the Eliquis.  Heart Healthy diabetic diet.  Weigh daily Call [phone] if weight climbs more than 3 pounds in a day or 5 pounds in a week. No salt to very little salt in your diet.  No more than 2000 mg in a day. Call if increased shortness of breath or increased swelling.  Call if questions or problems     we did increase your hydralazine.  Note new dose.

## 2015-06-11 NOTE — Transfer of Care (Signed)
Immediate Anesthesia Transfer of Care Note  Patient: Jessica [name]  Procedure(s) Performed: Procedure(s): CARDIOVERSION (N/A)  Patient Location: PACU  Anesthesia Type:General  Level of Consciousness: awake, patient cooperative and lethargic  Airway & Oxygen Therapy: Patient Spontanous Breathing and Patient connected to nasal cannula oxygen  Post-op Assessment: Report given to RN and Post -op Vital signs reviewed and stable  Post vital signs: Reviewed and stable  Last Vitals:  Filed Vitals:   06/11/15 1149  BP: 110/53  Pulse:   Temp: 36.8 C  Resp: 19    Complications: No apparent anesthesia complications

## 2015-06-12 ENCOUNTER — Encounter (HOSPITAL_COMMUNITY): Payer: Self-pay | Admitting: Cardiology

## 2015-06-18 ENCOUNTER — Other Ambulatory Visit: Payer: Self-pay | Admitting: Cardiology

## 2015-06-19 ENCOUNTER — Encounter: Payer: Self-pay | Admitting: Podiatry

## 2015-06-19 ENCOUNTER — Ambulatory Visit (INDEPENDENT_AMBULATORY_CARE_PROVIDER_SITE_OTHER): Payer: Medicare Other | Admitting: Podiatry

## 2015-06-19 DIAGNOSIS — M79676 Pain in unspecified toe(s): Secondary | ICD-10-CM

## 2015-06-19 DIAGNOSIS — B351 Tinea unguium: Secondary | ICD-10-CM | POA: Diagnosis not present

## 2015-06-19 DIAGNOSIS — Q828 Other specified congenital malformations of skin: Secondary | ICD-10-CM | POA: Diagnosis not present

## 2015-06-19 DIAGNOSIS — E1140 Type 2 diabetes mellitus with diabetic neuropathy, unspecified: Secondary | ICD-10-CM | POA: Diagnosis not present

## 2015-06-19 NOTE — Progress Notes (Signed)
Subjective:     Patient ID: Jessica [name], female   DOB: 1926/11/20, 81 y.o.   MRN: [id number]  HPIThis patient returns to the office for preventive foot care services.  She says her nails continue to be painful and she has pain related to callus on the ball of her left foot.  She presents to the office today talking of swollen feet due to heart problems for which she was admitted for five days.  She is much better and says the swelling has gone  down dramatically. Patient has diabetes.   Review of Systems     Objective:   Physical Exam GENERAL APPEARANCE: Alert, conversant. Appropriately groomed. No acute distress.  VASCULAR: Pedal pulses palpable at  [facility] and PT bilateral.  Capillary refill time is immediate to all digits,  Normal temperature gradient.  Digital hair growth is present bilateral  NEUROLOGIC: sensation is normal to 5.07 monofilament at 5/5 sites bilateral.  Light touch is intact bilateral, Muscle strength normal.  MUSCULOSKELETAL: acceptable muscle strength, tone and stability bilateral.  Intrinsic muscluature intact bilateral.  Rectus appearance of foot and digits noted bilateral.   DERMATOLOGIC: skin color, texture, and turgor are within normal limits.  No preulcerative lesions or ulcers  are seen, no interdigital maceration noted.  No open lesions present.  Digital nails are asymptomatic. No drainage noted. Callus sub 3 left foot.      Assessment:    Onychomycosis     Plan:    Debride onychomycosis  Debride callus.  RTC 3 months   Gardiner Barefoot DPM

## 2015-06-26 ENCOUNTER — Ambulatory Visit: Payer: Medicare Other | Admitting: Cardiology

## 2015-06-29 ENCOUNTER — Encounter: Payer: Self-pay | Admitting: Cardiology

## 2015-06-29 ENCOUNTER — Ambulatory Visit (INDEPENDENT_AMBULATORY_CARE_PROVIDER_SITE_OTHER): Payer: Medicare Other | Admitting: Cardiology

## 2015-06-29 VITALS — BP 160/84 | HR 78 | Ht 62.0 in | Wt 157.0 lb

## 2015-06-29 DIAGNOSIS — Z79899 Other long term (current) drug therapy: Secondary | ICD-10-CM

## 2015-06-29 DIAGNOSIS — I480 Paroxysmal atrial fibrillation: Secondary | ICD-10-CM

## 2015-06-29 LAB — BASIC METABOLIC PANEL
BUN: 21 mg/dL (ref 6–23)
CO2: 27 meq/L (ref 19–32)
Calcium: 9.9 mg/dL (ref 8.4–10.5)
Chloride: 106 mEq/L (ref 96–112)
Creatinine, Ser: 1.02 mg/dL (ref 0.40–1.20)
GFR: 54.58 mL/min — ABNORMAL LOW (ref >60.00)
GLUCOSE: 90 mg/dL (ref 70–99)
POTASSIUM: 3.9 meq/L (ref 3.5–5.1)
Sodium: 140 mEq/L (ref 135–145)

## 2015-06-29 MED ORDER — FUROSEMIDE 20 MG PO TABS: 20.0000 mg | ORAL_TABLET | Freq: Every day | ORAL | Status: DC

## 2015-06-29 NOTE — Progress Notes (Signed)
Cardiology Office Note   Date:  06/29/2015   ID:  Jessica [name], DOB Nov 11, 1926, MRN [id number]  PCP:  [name], MD  Cardiologist: Darlin Coco MD  No chief complaint on file.     History of Present Illness: Jessica [name] is a 81 y.o. female who presents for hospital follow-up.  She was recently admitted with congestive heart failure secondary to unrecognized recurrence of atrial fibrillation in the presence of a pacemaker.  She has a complex past medical history. She has a history of sick sinus syndrome and has a permanent pacemaker implanted on 07/14/10. She has had no further episodes of syncope since the pacemaker implantation. She has a history of ischemic heart disease and underwent insertion of 2 drug-eluting stents placed in the mid and distal right coronary artery in September 2010 by Dr. Terrence Dupont. She has a history of diabetes, essential hypertension, and high cholesterol. An echocardiogram in September 2011 showed normal ejection fraction of 03-47% with diastolic dysfunction. She is status post cholecystectomy by Dr. Brantley Stage. She has had an uneventful postoperative course and is not having any digestive symptoms other than for occasional bowel irregularity. She does take stool softeners. She was admitted on 06/08/15 in significant CHF.  She had gained more than 10 pounds and had bilateral peripheral edema.  Interrogation of her pacemaker revealed it she had gone back into atrial fibrillation in mid July.  She was treated with aggressive IV diuresis.  On 06/11/15 she was able to be cardioverted back to normal sinus rhythm.  She had already prior to admission been on long-term anticoagulation for her paroxysmal atrial fibrillation.  Since discharge she has continued to have mild peripheral edema.  It does not go down overnight.  She has not been having any chest pain.  Past Medical History  Diagnosis Date  . Hypertension   . Diabetes mellitus     NON INSULIN DEPENDENT  .  Hypercholesterolemia   . DJD (degenerative joint disease)   . History of diabetic neuropathy   . CAD (coronary artery disease)     a. s/p DESx2 to mid and distal RCA in 2010.  . SSS (sick sinus syndrome)     a. s/p Medtronic PPM by JA for SSS and syncope 9/11.  . Breast cancer   . GERD (gastroesophageal reflux disease)   . Hemorrhoids   . Cholelithiasis 09/12/2013    Lap Chole on 09/14/13   . Paroxysmal atrial fibrillation     discovered on PPM interrogation 12/15, CHAD2VASC score is 6  . CKD (chronic kidney disease), stage III   . Chronic diastolic CHF (congestive heart failure)     a. Echo 06/09/15: EF 55-60%, normal wall motion, mild AI, mild to moderate MR, mild LAE, mildly reduced RVSF, mild RAE, moderate TR, PASP 53 mmHg    Past Surgical History  Procedure Laterality Date  . Cardiac catheterization  07/05/2009    EF 55-60%  . Insert / replace / remove pacemaker  9/11    SSS and syncope, implanted by JA (MDT)  . Mastectomy  1992    BILATERAL WITH RECONSTRUCTION  . Colonoscopy    . Ercp N/A 09/13/2013    Procedure: ENDOSCOPIC RETROGRADE CHOLANGIOPANCREATOGRAPHY (ERCP);  Surgeon: [name], MD;  Location: [facility] ENDOSCOPY;  Service: Endoscopy;  Laterality: N/A;  . Cholecystectomy N/A 09/14/2013    Procedure: LAPAROSCOPIC CHOLECYSTECTOMY WITH INTRAOPERATIVE CHOLANGIOGRAM;  Surgeon: [name], MD;  Location: [place];  Service: General;  Laterality: N/A;  .  Cardioversion N/A 06/11/2015    Procedure: CARDIOVERSION;  Surgeon: [name], MD;  Location: [facility] ENDOSCOPY;  Service: Cardiovascular;  Laterality: N/A;     Current Outpatient Prescriptions  Medication Sig Dispense Refill  . apixaban (ELIQUIS) 5 MG TABS tablet Take 5 mg by mouth 2 (two) times daily.    . calcium carbonate (OS-CAL) 600 MG TABS tablet Take 600 mg by mouth daily with breakfast.     . carvedilol (COREG) 25 MG tablet Take 1 tablet (25 mg total) by mouth 2 (two) times daily. 180 tablet 3  .  Cyanocobalamin (VITAMIN B12 PO) Take 1 tablet by mouth daily.     . fish oil-omega-3 fatty acids 1000 MG capsule Take 1 g by mouth every morning.     . gabapentin (NEURONTIN) 300 MG capsule Take 1 capsule (300 mg total) by mouth 2 (two) times daily. 60 capsule 6  . hydrALAZINE (APRESOLINE) 25 MG tablet Take 1 tablet (25 mg total) by mouth every 8 (eight) hours. 90 tablet 6  . lisinopril (PRINIVIL,ZESTRIL) 20 MG tablet Take 1 tablet (20 mg total) by mouth 2 (two) times daily. 60 tablet 1  . metFORMIN (GLUCOPHAGE) 500 MG tablet TAKE ONE TABLET BY MOUTH ONCE DAILY WITH  BREAKFAST 90 tablet 3  . nitroGLYCERIN (NITROSTAT) 0.4 MG SL tablet Place 1 tablet (0.4 mg total) under the tongue every 5 (five) minutes as needed for chest pain. x3 doses as needed for chest pain 25 tablet prn  . polyethylene glycol (MIRALAX / GLYCOLAX) packet Take 17 g by mouth daily as needed for moderate constipation.    Marland Kitchen pyridOXINE (VITAMIN B-6) 100 MG tablet Take 100 mg by mouth daily.    . rosuvastatin (CRESTOR) 10 MG tablet TAKE ONE TABLET BY MOUTH ONCE DAILY IN THE MORNING 90 tablet 3  . furosemide (LASIX) 20 MG tablet Take 1 tablet (20 mg total) by mouth daily. 90 tablet 3   No current facility-administered medications for this visit.    Allergies:   Lyrica; Amlodipine; and Sulfonamide derivatives    Social History:  The patient  reports that she has never smoked. She has never used smokeless tobacco. She reports that she does not drink alcohol or use illicit drugs.   Family History:  The patient's family history includes Cancer in her father and mother.    ROS:  Please see the history of present illness.   Otherwise, review of systems are positive for none.   All other systems are reviewed and negative.    PHYSICAL EXAM: VS:  BP 160/84 mmHg  Pulse 78  Ht 5\' 2"  (1.575 m)  Wt 157 lb (71.215 kg)  BMI 28.71 kg/m2  SpO2 99% , BMI Body mass index is 28.71 kg/(m^2). GEN: Well nourished, well developed, in no acute  distress HEENT: normal Neck: no JVD, carotid bruits, or masses Cardiac: RRR; no murmurs, rubs, or gallops, there is 1-2+ bilateral pretibial edema persisting Respiratory:  clear to auscultation bilaterally, normal work of breathing GI: soft, nontender, nondistended, + BS MS: no deformity or atrophy Skin: warm and dry, no rash Neuro:  Strength and sensation are intact Psych: euthymic mood, full affect   EKG:  EKG is ordered today. The ekg ordered today demonstrates atrial paced rhythm.  No ischemic changes.   Recent Labs: 06/08/2015: ALT 21; B Natriuretic Peptide 828.8* 06/09/2015: Hemoglobin 11.9*; Platelets 267 06/11/2015: BUN 31*; Creatinine, Ser 1.25*; Potassium 3.5; Sodium 138    Lipid Panel    Component Value Date/Time  CHOL 101 06/09/2015 0330   TRIG 52 06/09/2015 0330   HDL 38* 06/09/2015 0330   CHOLHDL 2.7 06/09/2015 0330   VLDL 10 06/09/2015 0330   LDLCALC 53 06/09/2015 0330   LDLDIRECT 152.0 10/10/2013 1619      Wt Readings from Last 3 Encounters:  06/29/15 157 lb (71.215 kg)  06/11/15 149 lb 14.3 oz (67.991 kg)  12/23/14 156 lb (70.761 kg)        ASSESSMENT AND PLAN:  1. Hypertensive heart disease without heart failure 2. sick sinus syndrome with functioning pacemaker 3. type 2 diabetes with peripheral neuropathy 4. coronary artery disease with drug-eluting stents placed in September 2010 by Dr. Terrence Dupont 5. status post cholecystectomy 6. Hypercholesterolemia. 7. Paroxysmal atrial fibrillation with chads vascular score of 6. On Eliquis.  Required recent cardioversion for paroxysmal atrial fibrillation on 06/11/2015. 8.  Recent congestive heart failure secondary to paroxysmal atrial fibrillation. 9. Peripheral edema.  Current medicines are reviewed at length with the patient today.  The patient does not have concerns regarding medicines.  The following changes have been made: Stop HCTZ 12.5 mg daily and start furosemide 20 mg daily.  Labs/ tests  ordered today include:   Orders Placed This Encounter  Procedures  . Basic metabolic panel  . Basic metabolic panel  . EKG 12-Lead     Disposition: We are checking a basal metabolic panel today as a baseline.  We will have her return in 2 weeks for follow-up basal metabolic panel to see how she is doing on the Lasix.  Recheck in 3 months for office visit.  Berna Spare MD 06/29/2015 12:58 PM    [address], [place], [place]  [address] Phone: [phone]; Fax: [phone]

## 2015-06-29 NOTE — Patient Instructions (Signed)
Medication Instructions:  STOP HCTZ  START LASIX (FUROSEMIDE) 20 MG DAILY  Labwork: BMET  BMET IN 2 WEEKS  Testing/Procedures: NONE  Follow-Up: Your physician recommends that you schedule a follow-up appointment in: [place]

## 2015-06-29 NOTE — Progress Notes (Signed)
Quick Note:  Please report to patient. The recent labs are stable. Continue same medication and careful diet. Kidney function is back to normal. ______

## 2015-07-11 ENCOUNTER — Encounter (HOSPITAL_COMMUNITY): Payer: Self-pay | Admitting: Emergency Medicine

## 2015-07-11 ENCOUNTER — Emergency Department (HOSPITAL_COMMUNITY): Payer: Medicare Other

## 2015-07-11 ENCOUNTER — Emergency Department (HOSPITAL_COMMUNITY)
Admission: EM | Admit: 2015-07-11 | Discharge: 2015-07-11 | Disposition: A | Discharge status: Home / Self Care | Payer: Medicare Other | Attending: Emergency Medicine | Admitting: Emergency Medicine

## 2015-07-11 DIAGNOSIS — Z79899 Other long term (current) drug therapy: Secondary | ICD-10-CM | POA: Diagnosis not present

## 2015-07-11 DIAGNOSIS — Y92007 Garden or yard of unspecified non-institutional (private) residence as the place of occurrence of the external cause: Secondary | ICD-10-CM | POA: Insufficient documentation

## 2015-07-11 DIAGNOSIS — E119 Type 2 diabetes mellitus without complications: Secondary | ICD-10-CM | POA: Diagnosis not present

## 2015-07-11 DIAGNOSIS — Y998 Other external cause status: Secondary | ICD-10-CM | POA: Insufficient documentation

## 2015-07-11 DIAGNOSIS — I129 Hypertensive chronic kidney disease with stage 1 through stage 4 chronic kidney disease, or unspecified chronic kidney disease: Secondary | ICD-10-CM | POA: Diagnosis not present

## 2015-07-11 DIAGNOSIS — S99911A Unspecified injury of right ankle, initial encounter: Secondary | ICD-10-CM | POA: Diagnosis present

## 2015-07-11 DIAGNOSIS — W1839XA Other fall on same level, initial encounter: Secondary | ICD-10-CM | POA: Diagnosis not present

## 2015-07-11 DIAGNOSIS — S82301A Unspecified fracture of lower end of right tibia, initial encounter for closed fracture: Secondary | ICD-10-CM

## 2015-07-11 DIAGNOSIS — Z8739 Personal history of other diseases of the musculoskeletal system and connective tissue: Secondary | ICD-10-CM | POA: Diagnosis not present

## 2015-07-11 DIAGNOSIS — N183 Chronic kidney disease, stage 3 (moderate): Secondary | ICD-10-CM | POA: Diagnosis not present

## 2015-07-11 DIAGNOSIS — I5032 Chronic diastolic (congestive) heart failure: Secondary | ICD-10-CM | POA: Insufficient documentation

## 2015-07-11 DIAGNOSIS — Z950 Presence of cardiac pacemaker: Secondary | ICD-10-CM | POA: Diagnosis not present

## 2015-07-11 DIAGNOSIS — Z8719 Personal history of other diseases of the digestive system: Secondary | ICD-10-CM | POA: Insufficient documentation

## 2015-07-11 DIAGNOSIS — S82391A Other fracture of lower end of right tibia, initial encounter for closed fracture: Secondary | ICD-10-CM | POA: Diagnosis not present

## 2015-07-11 DIAGNOSIS — E780 Pure hypercholesterolemia: Secondary | ICD-10-CM | POA: Diagnosis not present

## 2015-07-11 DIAGNOSIS — I2510 Atherosclerotic heart disease of native coronary artery without angina pectoris: Secondary | ICD-10-CM | POA: Diagnosis not present

## 2015-07-11 DIAGNOSIS — S82401A Unspecified fracture of shaft of right fibula, initial encounter for closed fracture: Secondary | ICD-10-CM | POA: Diagnosis not present

## 2015-07-11 DIAGNOSIS — Z853 Personal history of malignant neoplasm of breast: Secondary | ICD-10-CM | POA: Insufficient documentation

## 2015-07-11 DIAGNOSIS — S89191A Other physeal fracture of lower end of right tibia, initial encounter for closed fracture: Secondary | ICD-10-CM | POA: Diagnosis not present

## 2015-07-11 DIAGNOSIS — S82831A Other fracture of upper and lower end of right fibula, initial encounter for closed fracture: Secondary | ICD-10-CM | POA: Diagnosis not present

## 2015-07-11 DIAGNOSIS — Y9389 Activity, other specified: Secondary | ICD-10-CM | POA: Diagnosis not present

## 2015-07-11 DIAGNOSIS — S89301A Unspecified physeal fracture of lower end of right fibula, initial encounter for closed fracture: Secondary | ICD-10-CM | POA: Diagnosis not present

## 2015-07-11 MED ORDER — HYDROCODONE-ACETAMINOPHEN 5-325 MG PO TABS: 1.0000 | ORAL_TABLET | Freq: Four times a day (QID) | ORAL | Status: DC | PRN

## 2015-07-11 NOTE — ED Provider Notes (Signed)
CSN: [id number]     Arrival date & time 07/11/15  6979 History   First MD Initiated Contact with Patient 07/11/15 [phone]     Chief Complaint  Patient presents with  . Fall  . Ankle Pain     (Consider location/radiation/quality/duration/timing/severity/associated sxs/prior Treatment) HPI   81 year old female with history of sick sinus syndrome status post Medtronic pacemaker, A. fib currently on adequacy, diabetes, CAD, CHF brought here via EMS from home for evaluation of a fall 2 days ago. Patient report 2 days ago she was working out in the yard, she was picking up stick when she accidentally fell and injured her right ankle. She felt that her leg gave out on her.  Since then she has noticed swelling redness and warmth to the right ankle. Pain is moderate in severity. Pain improves with taking Vicodin, ice, and elevated. She also suffered several bruising in the R arm and right thigh from the fall. She denies hitting her head or loss of consciousness. She denies any precipitating symptoms prior to the fall. No active chest pain, shortness of breath, bowel bladder incontinence, saddle anesthesia, focal numbness or weakness. She is here at the urging of her family for further evaluation. She denies any knee pain or foot pain.  She has a walker at home to use as needed.  She's able to "hobble" around but unable to bear weight on affected ankle.     Past Medical History  Diagnosis Date  . Hypertension   . Diabetes mellitus     NON INSULIN DEPENDENT  . Hypercholesterolemia   . DJD (degenerative joint disease)   . History of diabetic neuropathy   . CAD (coronary artery disease)     a. s/p DESx2 to mid and distal RCA in 2010.  . SSS (sick sinus syndrome)     a. s/p Medtronic PPM by JA for SSS and syncope 9/11.  . Breast cancer   . GERD (gastroesophageal reflux disease)   . Hemorrhoids   . Cholelithiasis 09/12/2013    Lap Chole on 09/14/13   . Paroxysmal atrial fibrillation     discovered on  PPM interrogation 12/15, CHAD2VASC score is 6  . CKD (chronic kidney disease), stage III   . Chronic diastolic CHF (congestive heart failure)     a. Echo 06/09/15: EF 55-60%, normal wall motion, mild AI, mild to moderate MR, mild LAE, mildly reduced RVSF, mild RAE, moderate TR, PASP 53 mmHg   Past Surgical History  Procedure Laterality Date  . Cardiac catheterization  07/05/2009    EF 55-60%  . Insert / replace / remove pacemaker  9/11    SSS and syncope, implanted by JA (MDT)  . Mastectomy  1992    BILATERAL WITH RECONSTRUCTION  . Colonoscopy    . Ercp N/A 09/13/2013    Procedure: ENDOSCOPIC RETROGRADE CHOLANGIOPANCREATOGRAPHY (ERCP);  Surgeon: [name], MD;  Location: [facility] ENDOSCOPY;  Service: Endoscopy;  Laterality: N/A;  . Cholecystectomy N/A 09/14/2013    Procedure: LAPAROSCOPIC CHOLECYSTECTOMY WITH INTRAOPERATIVE CHOLANGIOGRAM;  Surgeon: [name], MD;  Location: [place];  Service: General;  Laterality: N/A;  . Cardioversion N/A 06/11/2015    Procedure: CARDIOVERSION;  Surgeon: [name], MD;  Location: [facility] ENDOSCOPY;  Service: Cardiovascular;  Laterality: N/A;   Family History  Problem Relation Age of Onset  . Cancer Mother   . Cancer Father    Social History  Substance Use Topics  . Smoking status: Never Smoker   . Smokeless tobacco:  Never Used  . Alcohol Use: No   OB History    No data available     Review of Systems  Musculoskeletal: Positive for arthralgias.  All other systems reviewed and are negative.     Allergies  Lyrica; Amlodipine; and Sulfonamide derivatives  Home Medications   Prior to Admission medications   Medication Sig Start Date End Date Taking? Authorizing Provider  apixaban (ELIQUIS) 5 MG TABS tablet Take 5 mg by mouth 2 (two) times daily.    Historical Provider, MD  calcium carbonate (OS-CAL) 600 MG TABS tablet Take 600 mg by mouth daily with breakfast.     Historical Provider, MD  carvedilol (COREG) 25 MG tablet Take 1 tablet  (25 mg total) by mouth 2 (two) times daily. 06/11/15   Isaiah Serge, NP  Cyanocobalamin (VITAMIN B12 PO) Take 1 tablet by mouth daily.     Historical Provider, MD  fish oil-omega-3 fatty acids 1000 MG capsule Take 1 g by mouth every morning.     Historical Provider, MD  furosemide (LASIX) 20 MG tablet Take 1 tablet (20 mg total) by mouth daily. 06/29/15   Darlin Coco, MD  gabapentin (NEURONTIN) 300 MG capsule Take 1 capsule (300 mg total) by mouth 2 (two) times daily. 06/11/15   Isaiah Serge, NP  hydrALAZINE (APRESOLINE) 25 MG tablet Take 1 tablet (25 mg total) by mouth every 8 (eight) hours. 06/11/15   Isaiah Serge, NP  lisinopril (PRINIVIL,ZESTRIL) 20 MG tablet Take 1 tablet (20 mg total) by mouth 2 (two) times daily. 05/30/15   Darlin Coco, MD  metFORMIN (GLUCOPHAGE) 500 MG tablet TAKE ONE TABLET BY MOUTH ONCE DAILY WITH  BREAKFAST 07/27/14   Darlin Coco, MD  nitroGLYCERIN (NITROSTAT) 0.4 MG SL tablet Place 1 tablet (0.4 mg total) under the tongue every 5 (five) minutes as needed for chest pain. x3 doses as needed for chest pain 08/14/14   Darlin Coco, MD  polyethylene glycol [facility] / GLYCOLAX) packet Take 17 g by mouth daily as needed for moderate constipation.    Historical Provider, MD  pyridOXINE (VITAMIN B-6) 100 MG tablet Take 100 mg by mouth daily.    Historical Provider, MD  rosuvastatin (CRESTOR) 10 MG tablet TAKE ONE TABLET BY MOUTH ONCE DAILY IN THE MORNING 08/14/14   Darlin Coco, MD   BP 109/39 mmHg  Pulse 62  Temp(Src) 98 F (36.7 C)  Resp 16  Wt 155 lb (70.308 kg)  SpO2 98% Physical Exam  Constitutional: She is oriented to person, place, and time. She appears well-developed and well-nourished. No distress.  Frail-appearing Caucasian female resting in bed hard of hearing but in no acute distress.  HENT:  Head: Atraumatic.  Eyes: Conjunctivae are normal.  Neck: Neck supple.  Cardiovascular: Normal rate, regular rhythm and intact distal pulses.    Pulmonary/Chest: Effort normal and breath sounds normal.  Abdominal: Soft. There is no tenderness.  Musculoskeletal: She exhibits tenderness (right ankle: Tenderness to both medial and lateral malleolus region on palpation with surrounding erythema warmth and edema noted. Decreased range of motion secondary to pain. No gross deformity,  no laceration.).  Intact pedal pulse bilaterally. No pain to bilateral knee or hip.  Neurological: She is alert and oriented to person, place, and time.  Skin: No rash noted.  Old ecchymosis noted to right upper and right forearm without tenderness to palpation no gross deformity.   Psychiatric: She has a normal mood and affect.  Nursing note and vitals reviewed.  ED Course  Procedures (including critical care time)  Patient with mechanical injury to her right ankle secondary to fall 2 days ago. She is neurovascularly intact.  No other significant injuy.  Xray of R ankle demonstrate fx distal fibular metaphysis with mild lateral displacement distally.  Pt will benefit from ankle splint, non weight bearing, and to f/u with her orthopedist Dr. Frankey Shown.    11:01 AM I have consulted Dr. Phoebe Sharps office and spoke with his nurse.  Pt will call and f/u with Dr. Erlinda Hong outpt.  Pt will be placed in a posterior splint.  She will use her walker at home to help ambulate.  Pain medication will be provided.     Labs Review Labs Reviewed - No data to display  Imaging Review Dg Ankle Complete Right  07/11/2015   CLINICAL DATA:  Pain following fall 3 days prior  EXAM: RIGHT ANKLE - COMPLETE 3+ VIEW  COMPARISON:  None.  FINDINGS: Frontal, oblique, and lateral views were obtained. There is a fracture of the distal fibular metaphysis with mild lateral displacement distally. There is a fracture fragment anterior to the fibula seen on the lateral view which has an uncertain origin. There is no appreciable joint effusion. There is generalized soft tissue swelling. The ankle mortise  appears grossly intact.  IMPRESSION: Fracture distal fibular metaphysis with mild lateral displacement distally. There is a fracture fragment anterior to the fibula on the lateral view, of uncertain origin. Ankle mortise appears grossly intact.   Electronically Signed   By: Lowella Grip III M.D.   On: 07/11/2015 10:30   I have personally reviewed and evaluated these images and lab results as part of my medical decision-making.   EKG Interpretation None      MDM   Final diagnoses:  Traumatic closed displaced fracture of distal end of tibia with fibula, right, initial encounter    BP 121/47 mmHg  Pulse 63  Temp(Src) 98 F (36.7 C)  Resp 18  Wt 155 lb (70.308 kg)  SpO2 95%      Domenic Moras, PA-C 07/11/15 [place], MD 07/11/15 [phone]

## 2015-07-11 NOTE — Discharge Instructions (Signed)
You have a broken bone in your right ankle.  Use walker, wear splint, keep ankle elevated and do not bear weight when possible. Follow up with Dr. Erlinda [name] for further care.  Take pain medication as needed.  Ankle Fracture with Rehab Two bones in the ankle, the shinbone (tibia) and the bone of the outer ankle and lower leg (fibula), are susceptible to being fractured. The fracture may be a complete or an incomplete break of the bone. It is also common for the ligaments of the ankle joint to be injured at the same time as a fracture. SYMPTOMS   Severe pain in the ankle at the time of injury and/or when trying to move the ankle.  Feeling of popping or tearing in the inner or outer part of the ankle, sometimes as if the ankle joint was temporarily dislocated and popped back into place.  Cracking or other sounds may be heard at the time of fracture.  Severe tenderness in the ankle.  Swelling in the ankle and foot  Blisters around the ankle (uncommon).  Bleeding and bruising (contusion) in the ankle and foot.  Inability to stand or bear weight on the injured foot.  Visible deformity if the fracture is complete and the bone fragments separate enough to distort normal leg contours.  Numbness and coldness in the foot if the blood supply is impaired. CAUSES  Bones break when subjected to a force that is greater than the their strength. Most fractures are due to direct trauma, such as being hit with an object or falling.  Fractured may also be caused by indirect stress, such as twisting, pivoting, or violent muscle contraction . RISK INCREASES WITH:  Sports that require quick changes in direction (football, soccer, or skiing).  Sports that require jumping (basketball, volleyball, distance jumping, or high jumping).  Walking or running on uneven or rough surfaces.  Shoes with inadequate support to prevent the foot and ankle from rolling over when stress occurs.  Bony abnormalities  (osteoporosis or bone tumors).  Metabolic disorders, hormone problems, and nutritional deficiencies and disorders.  Poor strength and flexibility  Previous ankle injury. PREVENTION   Warm up and stretch properly before activity.  Maintain physical fitness:  Leg and ankle strength.  Flexibility and endurance.  Cardiovascular fitness.  Wear properly fitted protective equipment (high-top shoes or when appropriate and ankle bracing, taping, or splinting), especially for the first 12 months after an ankle injury. PROGNOSIS  If treated properly, ankle fractures typically heal well. RELATED COMPLICATIONS   Failure to heal (nonunion).  Healing in poor position (malunion).  Arrest of normal bone growth in children.  Proneness to repeated ankle injury.  Stiff ankle.  Unstable or arthritic ankle.  Infected skin blisters.  Prolonged healing time if activity is resumed too quickly. Risks of surgery, including infection, bleeding, injury to nerves (numbness, weakness, paralysis), and need for further surgery. TREATMENT  Treatment initially consists of ice and medication to help reduce pain and inflammation. The joint must be immobilized to allow for healing. If the fracture is where the bones are out of alignment (displaced), then surgery may be necessary to realign (reduce) them. Surgery usually involves placing pins and screws in the bones to hold them in place while the fracture heals. After surgery the joint is immobilized. Bone growth stimulators may be used to promote bone growth, but this is uncommon, Strengthening and stretching exercises are usually necessary after immobilization in order to regain strength and a full range of motion. These  exercises may be completed at home or with a therapist. If pins and screws are placed in the bone, they are not usually removed unless they become a source of pain. MEDICATION   If pain medication is necessary, then nonsteroidal  anti-inflammatory medications, such as aspirin and ibuprofen, or other minor pain relievers, such as acetaminophen, are often recommended.  Do not take pain medication within 7 days before surgery.  Prescription pain relievers may be prescribed if deemed necessary by your caregiver. Use only as directed and only as much as you need. SEEK MEDICAL CARE IF:   Symptoms get worse or do not improve in 2 weeks despite treatment.  The following occur after immobilization or surgery:  Swelling above or below the fracture site.  Severe, persistent pain.  Blue or gray skin below the fracture site, especially under the nails, or numbness or loss of feeling below the fracture site. Report any of these signs immediately.  New, unexplained symptoms develop (drugs used in treatment may produce side effects). EXERCISES  RANGE OF MOTION (ROM) AND STRETCHING EXERCISES - Ankle Fracture These exercises may help you when beginning to rehabilitate your injury. Your symptoms may resolve with or without further involvement from your physician, physical therapist or athletic trainer. While completing these exercises, remember:   Restoring tissue flexibility helps normal motion to return to the joints. This allows healthier, less painful movement and activity.  An effective stretch should be held for at least 30 seconds.  A stretch should never be painful. You should only feel a gentle lengthening or release in the stretched tissue. RANGE OF MOTION - Dorsi/Plantar Flexion  While sitting with your right / left knee straight, draw the top of your foot upwards by flexing your ankle. Then reverse the motion, pointing your toes downward.  Hold each position for __________ seconds.  After completing your first set of exercises, repeat this exercise with your knee bent. Repeat __________ times. Complete this exercise __________ times per day.  RANGE OF MOTION- Ankle Plantar Flexion   Sit with your right / left  leg crossed over your opposite knee.  Use your opposite hand to pull the top of your foot and toes toward you.  You should feel a gentle stretch on the top of your foot/ankle. Hold this position for __________ seconds. Repeat __________ times. Complete __________ times per day.  RANGE OF MOTION - Ankle Eversion  Sit with your right / left ankle crossed over your opposite knee.  Grip your foot with your opposite hand, placing your thumb on the top of your foot and your fingers across the bottom of your foot.  Gently push your foot downward with a slight rotation so your littlest toes rise slightly  You should feel a gentle stretch on the inside of your ankle. Hold the stretch for __________ seconds. Repeat __________ times. Complete this exercise __________ times per day.  RANGE OF MOTION - Ankle Inversion  Sit with your right / left ankle crossed over your opposite knee.  Grip your foot with your opposite hand, placing your thumb on the bottom of your foot and your fingers across the top of your foot.  Gently pull your foot so the smallest toe comes toward you and your thumb pushes the inside of the ball of your foot away from you.  You should feel a gentle stretch on the outside of your ankle. Hold the stretch for __________ seconds. Repeat __________ times. Complete this exercise __________ times per day.  RANGE  OF MOTION - Ankle Alphabet  Imagine your right / left big toe is a pen.  Keeping your hip and knee still, write out the entire alphabet with your "pen." Make the letters as large as you can without increasing any discomfort. Repeat __________ times. Complete this exercise __________ times per day.  RANGE OF MOTION - Ankle Dorsiflexion, Active Assisted   Remove shoes and sit on a chair that is preferably not on a carpeted surface.  Place right / left foot under knee. Extend your opposite leg for support.  Keeping your heel down, slide your right / left foot back toward  the chair until you feel a stretch at your ankle or calf. If you do not feel a stretch, slide your bottom forward to the edge of the chair, while still keeping your heel down.  Hold this stretch for __________ seconds. Repeat __________ times. Complete this stretch __________ times per day.  STRETCH - Gastrocsoleus   Sit with your right / left leg extended. Holding onto both ends of a belt or towel, loop it around the ball of your foot.  Keeping your right / left ankle and foot relaxed and your knee straight, pull your foot and ankle toward you using the belt/towel.  You should feel a gentle stretch behind your calf or knee. Hold this position for __________ seconds. Repeat __________ times. Complete this stretch __________ times per day.  STRENGTHENING EXERCISES - Ankle Fracture These exercises may help you when beginning to rehabilitate your injury. They may resolve your symptoms with or without further involvement from your physician, physical therapist or athletic trainer. While completing these exercises, remember:   Muscles can gain both the endurance and the strength needed for everyday activities through controlled exercises.  Complete these exercises as instructed by your physician, physical therapist or athletic trainer. Progress the resistance and repetitions only as guided.  You may experience muscle soreness or fatigue, but the pain or discomfort you are trying to eliminate should never worsen during these exercises. If this pain does worsen, stop and make certain you are following the directions exactly. If the pain is still present after adjustments, discontinue the exercise until you can discuss the trouble with your clinician. STRENGTH - Dorsiflexors  Secure a rubber exercise band/tubing to a fixed object (table, pole) and loop the other end around your right / left foot.  Sit on the floor facing the fixed object. The band/tubing should be slightly tense when your foot is  relaxed.  Slowly draw your foot back toward you using your ankle and toes.  Hold this position for __________ seconds. Slowly release the tension in the band and return your foot to the starting position. Repeat __________ times. Complete this exercise __________ times per day.  STRENGTH - Plantar-flexors  Sit with your right / left leg extended. Holding onto both ends of a rubber exercise band/tubing, loop it around the ball of your foot. Keep a slight tension in the band.  Slowly push your toes away from you, pointing them downward.  Hold this position for __________ seconds. Return slowly, controlling the tension in the band/tubing. Repeat __________ times. Complete this exercise __________ times per day.  STRENGTH - Ankle Eversion  Secure one end of a rubber exercise band/tubing to a fixed object (table, pole). Loop the other end around your foot just before your toes.  Place your fists between your knees. This will focus your strengthening at your ankle.  Drawing the band/tubing across your opposite foot,  slowly, pull your little toe out and up. Make sure the band/tubing is positioned to resist the entire motion.  Hold this position for __________ seconds.  Have your muscles resist the band/tubing as it slowly pulls your foot back to the starting position. Repeat __________ times. Complete this exercise __________ times per day.  STRENGTH - Ankle Inversion  Secure one end of a rubber exercise band/tubing to a fixed object (table, pole). Loop the other end around your foot just before your toes.  Place your fists between your knees. This will focus your strengthening at your ankle.  Slowly, pull your big toe up and in, making sure the band/tubing is positioned to resist the entire motion.  Hold this position for __________ seconds.  Have your muscles resist the band/tubing as it slowly pulls your foot back to the starting position. Repeat __________ times. Complete this  exercises __________ times per day.  STRENGTH - Towel Curls  Sit in a chair positioned on a non-carpeted surface.  Place your foot on a towel, keeping your heel on the floor.  Pull the towel toward your heel by only curling your toes. Keep your heel on the floor.  If instructed by your physician, physical therapist or athletic trainer, add weight at the end of the towel. Repeat __________ times. Complete this exercise __________ times per day. STRENGTH - Plantar-flexors, Standing   Stand with your feet shoulder width apart. Steady yourself with a wall or table using as little support as needed.  Keeping your weight evenly spread over the width of your feet, rise up on your toes.*  Hold this position for __________ seconds. Repeat __________ times. Complete this exercise __________ times per day.  *If this is too easy, shift your weight toward your right / left leg until you feel challenged. Ultimately, you may be asked to do this exercise with your right / left foot only. Document Released: 05/21/2005 Document Revised: 01/12/2012 Document Reviewed: 02/01/2009 [name] Patient Information 2015 [place], [place]. This information is not intended to replace advice given to you by your health care provider. Make sure you discuss any questions you have with your health care provider.

## 2015-07-11 NOTE — ED Notes (Signed)
Pt from home for eval of fall on Monday, pt states she tripped and fell onto her right side. Pt reports right ankle pain, states no LOC or head injury. No deformity noted, sensation intact and pulses present. Pt does take blood thinners.

## 2015-07-11 NOTE — Progress Notes (Signed)
Orthopedic Tech Progress Note Patient Details:  Jessica [name] May 24, 1927 [id number]  Ortho Devices Type of Ortho Device: Ace wrap, Post (short leg) splint, Stirrup splint Ortho Device/Splint Location: rle Ortho Device/Splint Interventions: Application   Mcdaniel Ohms 07/11/2015, 11:41 AM

## 2015-07-13 ENCOUNTER — Other Ambulatory Visit: Payer: Medicare Other

## 2015-07-13 DIAGNOSIS — S8264XA Nondisplaced fracture of lateral malleolus of right fibula, initial encounter for closed fracture: Secondary | ICD-10-CM | POA: Diagnosis not present

## 2015-07-19 ENCOUNTER — Other Ambulatory Visit: Payer: Self-pay | Admitting: Cardiology

## 2015-07-20 ENCOUNTER — Other Ambulatory Visit (INDEPENDENT_AMBULATORY_CARE_PROVIDER_SITE_OTHER): Payer: Medicare Other | Admitting: *Deleted

## 2015-07-20 DIAGNOSIS — Z79899 Other long term (current) drug therapy: Secondary | ICD-10-CM | POA: Diagnosis not present

## 2015-07-20 DIAGNOSIS — S42291D Other displaced fracture of upper end of right humerus, subsequent encounter for fracture with routine healing: Secondary | ICD-10-CM | POA: Diagnosis not present

## 2015-07-20 LAB — BASIC METABOLIC PANEL
BUN: 30 mg/dL — AB (ref 6–23)
CHLORIDE: 107 meq/L (ref 96–112)
CO2: 28 meq/L (ref 19–32)
CREATININE: 1.03 mg/dL (ref 0.40–1.20)
Calcium: 9.7 mg/dL (ref 8.4–10.5)
GFR: 53.96 mL/min — ABNORMAL LOW (ref >60.00)
GLUCOSE: 105 mg/dL — AB (ref 70–99)
Potassium: 4.1 mEq/L (ref 3.5–5.1)
Sodium: 141 mEq/L (ref 135–145)

## 2015-07-20 NOTE — Addendum Note (Signed)
Addended by: Eulis Foster on: 07/20/2015 11:42 AM   Modules accepted: Orders

## 2015-07-27 DIAGNOSIS — S42291D Other displaced fracture of upper end of right humerus, subsequent encounter for fracture with routine healing: Secondary | ICD-10-CM | POA: Diagnosis not present

## 2015-08-20 ENCOUNTER — Other Ambulatory Visit: Payer: Self-pay | Admitting: Cardiology

## 2015-08-21 DIAGNOSIS — S8264XD Nondisplaced fracture of lateral malleolus of right fibula, subsequent encounter for closed fracture with routine healing: Secondary | ICD-10-CM | POA: Diagnosis not present

## 2015-08-21 DIAGNOSIS — S42291D Other displaced fracture of upper end of right humerus, subsequent encounter for fracture with routine healing: Secondary | ICD-10-CM | POA: Diagnosis not present

## 2015-08-22 DIAGNOSIS — S82891D Other fracture of right lower leg, subsequent encounter for closed fracture with routine healing: Secondary | ICD-10-CM | POA: Diagnosis not present

## 2015-08-22 DIAGNOSIS — R7309 Other abnormal glucose: Secondary | ICD-10-CM | POA: Diagnosis not present

## 2015-08-22 DIAGNOSIS — Z950 Presence of cardiac pacemaker: Secondary | ICD-10-CM | POA: Diagnosis not present

## 2015-08-27 DIAGNOSIS — S82891D Other fracture of right lower leg, subsequent encounter for closed fracture with routine healing: Secondary | ICD-10-CM | POA: Diagnosis not present

## 2015-08-27 DIAGNOSIS — R7309 Other abnormal glucose: Secondary | ICD-10-CM | POA: Diagnosis not present

## 2015-08-27 DIAGNOSIS — Z950 Presence of cardiac pacemaker: Secondary | ICD-10-CM | POA: Diagnosis not present

## 2015-08-28 DIAGNOSIS — E119 Type 2 diabetes mellitus without complications: Secondary | ICD-10-CM | POA: Diagnosis not present

## 2015-08-28 DIAGNOSIS — Z0100 Encounter for examination of eyes and vision without abnormal findings: Secondary | ICD-10-CM | POA: Diagnosis not present

## 2015-08-28 DIAGNOSIS — Z23 Encounter for immunization: Secondary | ICD-10-CM | POA: Diagnosis not present

## 2015-08-28 LAB — HM DIABETES EYE EXAM

## 2015-08-29 DIAGNOSIS — R7309 Other abnormal glucose: Secondary | ICD-10-CM | POA: Diagnosis not present

## 2015-08-29 DIAGNOSIS — S82891D Other fracture of right lower leg, subsequent encounter for closed fracture with routine healing: Secondary | ICD-10-CM | POA: Diagnosis not present

## 2015-08-29 DIAGNOSIS — Z950 Presence of cardiac pacemaker: Secondary | ICD-10-CM | POA: Diagnosis not present

## 2015-09-03 DIAGNOSIS — Z950 Presence of cardiac pacemaker: Secondary | ICD-10-CM | POA: Diagnosis not present

## 2015-09-03 DIAGNOSIS — S82891D Other fracture of right lower leg, subsequent encounter for closed fracture with routine healing: Secondary | ICD-10-CM | POA: Diagnosis not present

## 2015-09-03 DIAGNOSIS — R7309 Other abnormal glucose: Secondary | ICD-10-CM | POA: Diagnosis not present

## 2015-09-05 DIAGNOSIS — S82891D Other fracture of right lower leg, subsequent encounter for closed fracture with routine healing: Secondary | ICD-10-CM | POA: Diagnosis not present

## 2015-09-05 DIAGNOSIS — Z950 Presence of cardiac pacemaker: Secondary | ICD-10-CM | POA: Diagnosis not present

## 2015-09-05 DIAGNOSIS — R7309 Other abnormal glucose: Secondary | ICD-10-CM | POA: Diagnosis not present

## 2015-09-10 DIAGNOSIS — Z950 Presence of cardiac pacemaker: Secondary | ICD-10-CM | POA: Diagnosis not present

## 2015-09-10 DIAGNOSIS — S82891D Other fracture of right lower leg, subsequent encounter for closed fracture with routine healing: Secondary | ICD-10-CM | POA: Diagnosis not present

## 2015-09-10 DIAGNOSIS — R7309 Other abnormal glucose: Secondary | ICD-10-CM | POA: Diagnosis not present

## 2015-09-12 ENCOUNTER — Encounter: Payer: Self-pay | Admitting: Cardiology

## 2015-09-12 DIAGNOSIS — R7309 Other abnormal glucose: Secondary | ICD-10-CM | POA: Diagnosis not present

## 2015-09-12 DIAGNOSIS — Z950 Presence of cardiac pacemaker: Secondary | ICD-10-CM | POA: Diagnosis not present

## 2015-09-12 DIAGNOSIS — S82891D Other fracture of right lower leg, subsequent encounter for closed fracture with routine healing: Secondary | ICD-10-CM | POA: Diagnosis not present

## 2015-09-18 DIAGNOSIS — R7309 Other abnormal glucose: Secondary | ICD-10-CM | POA: Diagnosis not present

## 2015-09-18 DIAGNOSIS — Z950 Presence of cardiac pacemaker: Secondary | ICD-10-CM | POA: Diagnosis not present

## 2015-09-18 DIAGNOSIS — S82891D Other fracture of right lower leg, subsequent encounter for closed fracture with routine healing: Secondary | ICD-10-CM | POA: Diagnosis not present

## 2015-09-19 ENCOUNTER — Other Ambulatory Visit: Payer: Self-pay | Admitting: Cardiology

## 2015-09-20 DIAGNOSIS — Z950 Presence of cardiac pacemaker: Secondary | ICD-10-CM | POA: Diagnosis not present

## 2015-09-20 DIAGNOSIS — S82891D Other fracture of right lower leg, subsequent encounter for closed fracture with routine healing: Secondary | ICD-10-CM | POA: Diagnosis not present

## 2015-09-20 DIAGNOSIS — R7309 Other abnormal glucose: Secondary | ICD-10-CM | POA: Diagnosis not present

## 2015-09-24 DIAGNOSIS — S82891D Other fracture of right lower leg, subsequent encounter for closed fracture with routine healing: Secondary | ICD-10-CM | POA: Diagnosis not present

## 2015-09-24 DIAGNOSIS — R7309 Other abnormal glucose: Secondary | ICD-10-CM | POA: Diagnosis not present

## 2015-09-24 DIAGNOSIS — Z950 Presence of cardiac pacemaker: Secondary | ICD-10-CM | POA: Diagnosis not present

## 2015-09-25 DIAGNOSIS — S82891D Other fracture of right lower leg, subsequent encounter for closed fracture with routine healing: Secondary | ICD-10-CM | POA: Diagnosis not present

## 2015-09-25 DIAGNOSIS — Z950 Presence of cardiac pacemaker: Secondary | ICD-10-CM | POA: Diagnosis not present

## 2015-09-25 DIAGNOSIS — R7309 Other abnormal glucose: Secondary | ICD-10-CM | POA: Diagnosis not present

## 2015-10-01 DIAGNOSIS — R7309 Other abnormal glucose: Secondary | ICD-10-CM | POA: Diagnosis not present

## 2015-10-01 DIAGNOSIS — S82891D Other fracture of right lower leg, subsequent encounter for closed fracture with routine healing: Secondary | ICD-10-CM | POA: Diagnosis not present

## 2015-10-01 DIAGNOSIS — Z950 Presence of cardiac pacemaker: Secondary | ICD-10-CM | POA: Diagnosis not present

## 2015-10-02 DIAGNOSIS — S8264XD Nondisplaced fracture of lateral malleolus of right fibula, subsequent encounter for closed fracture with routine healing: Secondary | ICD-10-CM | POA: Diagnosis not present

## 2015-10-03 ENCOUNTER — Ambulatory Visit: Payer: Medicare Other | Admitting: Cardiology

## 2015-10-03 ENCOUNTER — Ambulatory Visit (INDEPENDENT_AMBULATORY_CARE_PROVIDER_SITE_OTHER): Payer: Medicare Other | Admitting: Nurse Practitioner

## 2015-10-03 ENCOUNTER — Encounter: Payer: Self-pay | Admitting: Nurse Practitioner

## 2015-10-03 VITALS — BP 154/72 | HR 68 | Ht 62.0 in

## 2015-10-03 DIAGNOSIS — R7309 Other abnormal glucose: Secondary | ICD-10-CM | POA: Diagnosis not present

## 2015-10-03 DIAGNOSIS — I4819 Other persistent atrial fibrillation: Secondary | ICD-10-CM

## 2015-10-03 DIAGNOSIS — I481 Persistent atrial fibrillation: Secondary | ICD-10-CM

## 2015-10-03 DIAGNOSIS — I5032 Chronic diastolic (congestive) heart failure: Secondary | ICD-10-CM | POA: Diagnosis not present

## 2015-10-03 DIAGNOSIS — I495 Sick sinus syndrome: Secondary | ICD-10-CM | POA: Diagnosis not present

## 2015-10-03 DIAGNOSIS — S82891D Other fracture of right lower leg, subsequent encounter for closed fracture with routine healing: Secondary | ICD-10-CM | POA: Diagnosis not present

## 2015-10-03 DIAGNOSIS — Z950 Presence of cardiac pacemaker: Secondary | ICD-10-CM | POA: Diagnosis not present

## 2015-10-03 NOTE — Progress Notes (Signed)
Electrophysiology Office Note Date: 10/03/2015  ID:  Jessica [name], DOB [birth year], MRN [id number]  PCP: Jessica Danes, MD Primary Cardiologist: Jessica [name] Electrophysiologist: [name]  CC: Pacemaker follow-up  Jessica [name] is a 81 y.o. female seen today for Dr Jessica [name].  She presents today for routine electrophysiology followup.  She was admitted this summer with atrial fibrillation and exacerbation of diastolic heart failure. She improved with cardioversion and diuresis.  She denies chest pain, palpitations, dyspnea, PND, orthopnea, nausea, vomiting, dizziness, syncope, edema, weight gain, or early satiety.  Device History: MDT dual chamber PPM implanted 2011 for SSS   Past Medical History  Diagnosis Date  . Hypertension   . Diabetes mellitus     NON INSULIN DEPENDENT  . Hypercholesterolemia   . DJD (degenerative joint disease)   . History of diabetic neuropathy   . CAD (coronary artery disease)     a. s/p DESx2 to mid and distal RCA in 2010.  . SSS (sick sinus syndrome) ([place])     a. s/p Medtronic PPM by JA for SSS and syncope 9/11.  . Breast cancer ([place])   . GERD (gastroesophageal reflux disease)   . Hemorrhoids   . Cholelithiasis 09/12/2013    Lap Chole on 09/14/13   . Paroxysmal atrial fibrillation (HCC)     discovered on PPM interrogation 12/15, CHAD2VASC score is 6  . CKD (chronic kidney disease), stage III   . Chronic diastolic CHF (congestive heart failure) ([place])     a. Echo 06/09/15: EF 55-60%, normal wall motion, mild AI, mild to moderate MR, mild LAE, mildly reduced RVSF, mild RAE, moderate TR, PASP 53 mmHg   Past Surgical History  Procedure Laterality Date  . Cardiac catheterization  07/05/2009    EF 55-60%  . Insert / replace / remove pacemaker  9/11    SSS and syncope, implanted by JA (MDT)  . Mastectomy  1992    BILATERAL WITH RECONSTRUCTION  . Colonoscopy    . Ercp N/A 09/13/2013    Procedure: ENDOSCOPIC RETROGRADE CHOLANGIOPANCREATOGRAPHY (ERCP);   Surgeon: Jessica Beams, MD;  Location[facility]COPY;  Service: Endoscopy;  Laterality: N/A;  . Cholecystectomy N/A 09/14/2013    Procedure: LAPAROSCOPIC CHOLECYSTECTOMY WITH INTRAOPERATIVE CHOLANGIOGRAM;  Surgeon: Jessica Faster. Cornett, MD;  Location: [place];  Service: General;  Laterality: N/A;  . Cardioversion N/A 06/11/2015    Procedure: CARDIOVERSION;  Surgeon: Jessica Spark, MD;  Location: [facility] ENDOSCOPY;  Service: Cardiovascular;  Laterality: N/A;    Current Outpatient Prescriptions  Medication Sig Dispense Refill  . apixaban (ELIQUIS) 5 MG TABS tablet Take 5 mg by mouth 2 (two) times daily.    . calcium carbonate (OS-CAL) 600 MG TABS tablet Take 600 mg by mouth daily with breakfast.     . carvedilol (COREG) 25 MG tablet Take 1 tablet (25 mg total) by mouth 2 (two) times daily. 180 tablet 3  . CRESTOR 10 MG tablet TAKE ONE TABLET BY MOUTH ONCE DAILY IN THE MORNING 90 tablet 3  . Cyanocobalamin (VITAMIN B12 PO) Take 1 tablet by mouth daily.     Marland Kitchen ELIQUIS 5 MG TABS tablet TAKE ONE TABLET BY MOUTH TWICE DAILY 60 tablet 3  . fish oil-omega-3 fatty acids 1000 MG capsule Take 1 g by mouth every morning.     Marland Kitchen FLUZONE HIGH-DOSE 0.5 ML SUSY ADM 0.5ML IM UTD  0  . furosemide (LASIX) 20 MG tablet Take 1 tablet (20 mg total) by mouth daily. 90 tablet 3  .  gabapentin (NEURONTIN) 300 MG capsule Take 1 capsule (300 mg total) by mouth 2 (two) times daily. 60 capsule 6  . hydrALAZINE (APRESOLINE) 25 MG tablet Take 1 tablet (25 mg total) by mouth every 8 (eight) hours. 90 tablet 6  . hydrochlorothiazide (MICROZIDE) 12.5 MG capsule Take 12.5 mg by mouth daily.    Marland Kitchen HYDROcodone-acetaminophen (NORCO/VICODIN) 5-325 MG per tablet Take 1 tablet by mouth every 6 (six) hours as needed for moderate pain. 15 tablet 0  . lisinopril (PRINIVIL,ZESTRIL) 20 MG tablet Take 1 tablet (20 mg total) by mouth 2 (two) times daily. 60 tablet 1  . metFORMIN (GLUCOPHAGE) 500 MG tablet TAKE ONE TABLET BY MOUTH ONCE DAILY WITH  BREAKFAST 90 tablet 3  . nitroGLYCERIN (NITROSTAT) 0.4 MG SL tablet Place 1 tablet (0.4 mg total) under the tongue every 5 (five) minutes as needed for chest pain. x3 doses as needed for chest pain 25 tablet prn  . polyethylene glycol (MIRALAX / GLYCOLAX) packet Take 17 g by mouth daily as needed for moderate constipation.    Marland Kitchen pyridOXINE (VITAMIN B-6) 100 MG tablet Take 100 mg by mouth daily.     No current facility-administered medications for this visit.    Allergies:   Lyrica; Amlodipine; and Sulfonamide derivatives   Social History: Social History   Social History  . Marital Status: Widowed    Spouse Name: N/A  . Number of Children: N/A  . Years of Education: N/A   Occupational History  . Not on file.   Social History Main Topics  . Smoking status: Never Smoker   . Smokeless tobacco: Never Used  . Alcohol Use: No  . Drug Use: No  . Sexual Activity: Not on file   Other Topics Concern  . Not on file   Social History Narrative    Family History: Family History  Problem Relation Age of Onset  . Cancer Mother   . Cancer Father      Review of Systems: All other systems reviewed and are otherwise negative except as noted above.   Physical Exam: VS:  BP 154/72 mmHg  Pulse 68  Ht 5\' 2"  (1.575 m) , BMI There is no weight on file to calculate BMI.  GEN- The patient is elderly and thin appearing, alert and oriented x 3 today.   HEENT: normocephalic, atraumatic; sclera clear, conjunctiva pink; hearing intact; oropharynx clear; neck supple  Lungs- Clear to ausculation bilaterally, normal work of breathing.  No wheezes, rales, rhonchi Heart- Regular rate and rhythm  GI- soft, non-tender, non-distended, bowel sounds present  Extremities- no clubbing, cyanosis, or edema; DP/PT/radial pulses 2+ bilaterally MS- no significant deformity or atrophy Skin- warm and dry, no rash or lesion; PPM pocket well healed Psych- euthymic mood, full affect Neuro- strength and sensation  are intact  PPM Interrogation- reviewed in detail today,  See PACEART report  EKG:  EKG is not ordered today.  Recent Labs: 06/08/2015: ALT 21; B Natriuretic Peptide 828.8* 06/09/2015: Hemoglobin 11.9*; Platelets 267 07/20/2015: BUN 30*; Creatinine, Ser 1.03; Potassium 4.1; Sodium 141   Wt Readings from Last 3 Encounters:  07/11/15 155 lb (70.308 kg)  06/29/15 157 lb (71.215 kg)  06/11/15 149 lb 14.3 oz (67.991 kg)     Other studies Reviewed: Additional studies/ records that were reviewed today include: Dr Jessica [name] and Dr Jackalyn Lombard office notes  Assessment and Plan:  1.  Sick sinus syndrome Normal PPM function See Pace Art report No changes today  2.  Persistent atrial fibrillation  Maintaining SR by device interrogation today  Continue Eliquis for CHADS2VASC of 6  3. Chronic diastolic heart failure Stable No change required today BMET today  Current medicines are reviewed at length with the patient today.   The patient does not have concerns regarding her medicines.  The following changes were made today:  none  Labs/ tests ordered today include: BMET  Disposition:   Follow up with [facility], Dr Jessica [name] in 1 year    Signed, Chanetta Marshall, NP 10/03/2015 4:34 PM  [facility] [address] [place] [address] [phone] (office) [phone] (fax)

## 2015-10-03 NOTE — Patient Instructions (Signed)
Medication Instructions:   Your physician recommends that you continue on your current medications as directed. Please refer to the Current Medication list given to you today.     If you need a refill on your cardiac medications before your next appointment, please call your pharmacy.  Labwork: NONE ORDER TODAY     Testing/Procedures: .NONE ORDER TODAY     Follow-Up:   Your physician wants you to follow-up in: [place] will receive a reminder letter in the mail two months in advance. If you don't receive a letter, please call our office to schedule the follow-up appointment.     Any Other Special Instructions Will Be Listed Below (If Applicable).

## 2015-10-05 LAB — CUP PACEART INCLINIC DEVICE CHECK
Implantable Lead Implant Date: 20110912
Implantable Lead Location: 753860
Implantable Lead Model: 5076
Implantable Lead Model: 5092
Lead Channel Setting Sensing Sensitivity: 2 mV
MDC IDC LEAD IMPLANT DT: 20110912
MDC IDC LEAD LOCATION: 753859
MDC IDC SESS DTM: 20161202092603
MDC IDC SET LEADCHNL RA PACING AMPLITUDE: 2 V
MDC IDC SET LEADCHNL RV PACING AMPLITUDE: 2.5 V
MDC IDC SET LEADCHNL RV PACING PULSEWIDTH: 0.4 ms

## 2015-10-08 DIAGNOSIS — R7309 Other abnormal glucose: Secondary | ICD-10-CM | POA: Diagnosis not present

## 2015-10-08 DIAGNOSIS — S82891D Other fracture of right lower leg, subsequent encounter for closed fracture with routine healing: Secondary | ICD-10-CM | POA: Diagnosis not present

## 2015-10-08 DIAGNOSIS — Z950 Presence of cardiac pacemaker: Secondary | ICD-10-CM | POA: Diagnosis not present

## 2015-10-09 ENCOUNTER — Ambulatory Visit (INDEPENDENT_AMBULATORY_CARE_PROVIDER_SITE_OTHER): Payer: Medicare Other | Admitting: Podiatry

## 2015-10-09 ENCOUNTER — Encounter: Payer: Self-pay | Admitting: Podiatry

## 2015-10-09 DIAGNOSIS — E0842 Diabetes mellitus due to underlying condition with diabetic polyneuropathy: Secondary | ICD-10-CM | POA: Diagnosis not present

## 2015-10-09 DIAGNOSIS — M79676 Pain in unspecified toe(s): Secondary | ICD-10-CM | POA: Diagnosis not present

## 2015-10-09 DIAGNOSIS — B351 Tinea unguium: Secondary | ICD-10-CM | POA: Diagnosis not present

## 2015-10-09 NOTE — Progress Notes (Signed)
Subjective:     Patient ID: Jessica [name], female   DOB: [birth year], 81 y.o.   MRN: [id number]  HPIThis patient returns to the office for preventive foot care services.  She says her nails continue to be painful  She presents to the office today talking of swollen feet right foot due to fractured ankle right.. Patient has diabetic neuropathy.  Her callus has improved left foot but painful mass noted left forefoot.   Review of Systems     Objective:   Physical Exam GENERAL APPEARANCE: Alert, conversant. Appropriately groomed. No acute distress.  VASCULAR: Pedal pulses palpable at  [facility] and PT bilateral.  Capillary refill time is immediate to all digits,  Normal temperature gradient.  Digital hair growth is present bilateral  NEUROLOGIC: sensation is normal to 5.07 monofilament at 5/5 sites bilateral.  Light touch is intact bilateral, Muscle strength normal.  MUSCULOSKELETAL: acceptable muscle strength, tone and stability bilateral.  Intrinsic muscluature intact bilateral.  Rectus appearance of foot and digits noted bilateral. Plantar fibroma sub 3 left.  DERMATOLOGIC: skin color, texture, and turgor are within normal limits.  No preulcerative lesions or ulcers  are seen, no interdigital maceration noted.  No open lesions present.   NAILS  Thick disfigured discolored nails both feet.      Assessment:    Onychomycosis     Plan:    Debride onychomycosis   RTC 3 months.  She requests we proceed with nail surgery in January 2017.  She requests nail removal right hallux.   Gardiner Barefoot DPM

## 2015-10-10 DIAGNOSIS — S82891D Other fracture of right lower leg, subsequent encounter for closed fracture with routine healing: Secondary | ICD-10-CM | POA: Diagnosis not present

## 2015-10-10 DIAGNOSIS — Z950 Presence of cardiac pacemaker: Secondary | ICD-10-CM | POA: Diagnosis not present

## 2015-10-10 DIAGNOSIS — R7309 Other abnormal glucose: Secondary | ICD-10-CM | POA: Diagnosis not present

## 2015-10-15 DIAGNOSIS — S82891D Other fracture of right lower leg, subsequent encounter for closed fracture with routine healing: Secondary | ICD-10-CM | POA: Diagnosis not present

## 2015-10-15 DIAGNOSIS — R7309 Other abnormal glucose: Secondary | ICD-10-CM | POA: Diagnosis not present

## 2015-10-15 DIAGNOSIS — Z950 Presence of cardiac pacemaker: Secondary | ICD-10-CM | POA: Diagnosis not present

## 2015-10-16 ENCOUNTER — Encounter: Payer: Self-pay | Admitting: Internal Medicine

## 2015-10-17 ENCOUNTER — Other Ambulatory Visit: Payer: Self-pay | Admitting: Cardiology

## 2015-10-17 DIAGNOSIS — R7309 Other abnormal glucose: Secondary | ICD-10-CM | POA: Diagnosis not present

## 2015-10-17 DIAGNOSIS — Z950 Presence of cardiac pacemaker: Secondary | ICD-10-CM | POA: Diagnosis not present

## 2015-10-17 DIAGNOSIS — S82891D Other fracture of right lower leg, subsequent encounter for closed fracture with routine healing: Secondary | ICD-10-CM | POA: Diagnosis not present

## 2015-11-22 ENCOUNTER — Ambulatory Visit (INDEPENDENT_AMBULATORY_CARE_PROVIDER_SITE_OTHER): Payer: Medicare Other | Admitting: Cardiology

## 2015-11-22 ENCOUNTER — Encounter: Payer: Self-pay | Admitting: Cardiology

## 2015-11-22 VITALS — BP 164/90 | HR 76 | Ht 64.0 in | Wt 156.4 lb

## 2015-11-22 DIAGNOSIS — I480 Paroxysmal atrial fibrillation: Secondary | ICD-10-CM

## 2015-11-22 DIAGNOSIS — I5189 Other ill-defined heart diseases: Secondary | ICD-10-CM

## 2015-11-22 DIAGNOSIS — I519 Heart disease, unspecified: Secondary | ICD-10-CM

## 2015-11-22 MED ORDER — FUROSEMIDE 40 MG PO TABS: 40.0000 mg | ORAL_TABLET | Freq: Every day | ORAL | Status: DC

## 2015-11-22 NOTE — Patient Instructions (Addendum)
Medication Instructions:  INCREASE YOUR LASIX (FUROSEMIDE) TO 40 MG EVERY MORNING   Labwork: none  Testing/Procedures: none  Follow-Up: Your physician recommends that you schedule a follow-up appointment in: 4 month ov/bmet with Dr Acie Fredrickson   Any Other Special Instructions Will Be Listed Below (If Applicable). YOU NEED TO FIND A PRIMARY CARE PHYSICIAN  If you need a refill on your cardiac medications before your next appointment, please call your pharmacy.

## 2015-11-22 NOTE — Progress Notes (Signed)
Cardiology Office Note   Date:  11/22/2015   ID:  Jessica [name], DOB 1927-10-24, MRN [id number]  PCP:  [name], MD  Cardiologist: Darlin Coco MD  Chief Complaint  Patient presents with  . routine office visit    Patient denies chest pain, shortness of breath, le edema, or claudication      History of Present Illness: Jessica [name] is a 82 y.o. female who presents for scheduled four-month follow-up visit  Jessica [name] is a 82 y.o. female who presents for hospital follow-up. She was recently admitted with congestive heart failure secondary to unrecognized recurrence of atrial fibrillation in the presence of a pacemaker. She has a complex past medical history. She has a history of sick sinus syndrome and has a permanent pacemaker implanted on 07/14/10. She has had no further episodes of syncope since the pacemaker implantation. She has a history of ischemic heart disease and underwent insertion of 2 drug-eluting stents placed in the mid and distal right coronary artery in September 2010 by Dr. Terrence Dupont. She has a history of diabetes, essential hypertension, and high cholesterol. An echocardiogram in September 2011 showed normal ejection fraction of 123456 with diastolic dysfunction.a more recent echocardiogram on 06/09/15 showed ejection fraction of 55-60% and there was mild aortic insufficiency, mild to moderate mitral regurgitation, and elevated pulmonary artery pressure of 53.  She is status post cholecystectomy by Dr. Brantley Stage. She has had an uneventful postoperative course and is not having any digestive symptoms other than for occasional bowel irregularity. She does take stool softeners. She was admitted on 06/08/15 in significant CHF. She had gained more than 10 pounds and had bilateral peripheral edema. Interrogation of her pacemaker revealed it she had gone back into atrial fibrillation in mid July. She was treated with aggressive IV diuresis. On 06/11/15 she was able  to be cardioverted back to normal sinus rhythm. She had already prior to admission been on long-term anticoagulation for her paroxysmal atrial fibrillation. Since discharge she has continued to have mild peripheral edema. It does not go down overnight. She has not been having any chest pain. Since last visit she has been doing well except for moderate pitting edema of both legs.  She is sleeping in a left chair.  Blood pressure has been running higher at times.  She has urinary frequency and she has nocturia 4  Past Medical History  Diagnosis Date  . Hypertension   . Diabetes mellitus     NON INSULIN DEPENDENT  . Hypercholesterolemia   . DJD (degenerative joint disease)   . History of diabetic neuropathy   . CAD (coronary artery disease)     a. s/p DESx2 to mid and distal RCA in 2010.  . SSS (sick sinus syndrome) ([place])     a. s/p Medtronic PPM by JA for SSS and syncope 9/11.  . Breast cancer ([place])   . GERD (gastroesophageal reflux disease)   . Hemorrhoids   . Cholelithiasis 09/12/2013    Lap Chole on 09/14/13   . Paroxysmal atrial fibrillation (HCC)     discovered on PPM interrogation 12/15, CHAD2VASC score is 6  . CKD (chronic kidney disease), stage III   . Chronic diastolic CHF (congestive heart failure) ([place])     a. Echo 06/09/15: EF 55-60%, normal wall motion, mild AI, mild to moderate MR, mild LAE, mildly reduced RVSF, mild RAE, moderate TR, PASP 53 mmHg    Past Surgical History  Procedure Laterality Date  . Cardiac catheterization  07/05/2009    EF 55-60%  . Insert / replace / remove pacemaker  9/11    SSS and syncope, implanted by JA (MDT)  . Mastectomy  1992    BILATERAL WITH RECONSTRUCTION  . Colonoscopy    . Ercp N/A 09/13/2013    Procedure: ENDOSCOPIC RETROGRADE CHOLANGIOPANCREATOGRAPHY (ERCP);  Surgeon: [name], MD;  Location: [facility] ENDOSCOPY;  Service: Endoscopy;  Laterality: N/A;  . Cholecystectomy N/A 09/14/2013    Procedure: LAPAROSCOPIC CHOLECYSTECTOMY  WITH INTRAOPERATIVE CHOLANGIOGRAM;  Surgeon: [name], MD;  Location: [place];  Service: General;  Laterality: N/A;  . Cardioversion N/A 06/11/2015    Procedure: CARDIOVERSION;  Surgeon: [name], MD;  Location: [facility] ENDOSCOPY;  Service: Cardiovascular;  Laterality: N/A;     Current Outpatient Prescriptions  Medication Sig Dispense Refill  . apixaban (ELIQUIS) 5 MG TABS tablet Take 5 mg by mouth 2 (two) times daily.    Marland Kitchen b complex vitamins tablet Take 1 tablet by mouth daily.    . calcium carbonate (OS-CAL) 600 MG TABS tablet Take 600 mg by mouth daily with breakfast.     . carvedilol (COREG) 25 MG tablet Take 1 tablet (25 mg total) by mouth 2 (two) times daily. 180 tablet 3  . CRESTOR 10 MG tablet TAKE ONE TABLET BY MOUTH ONCE DAILY IN THE MORNING 90 tablet 3  . Cyanocobalamin (VITAMIN B12 PO) Take 1 tablet by mouth daily.     . fish oil-omega-3 fatty acids 1000 MG capsule Take 1 g by mouth every morning.     . furosemide (LASIX) 40 MG tablet Take 1 tablet (40 mg total) by mouth daily. 90 tablet 3  . gabapentin (NEURONTIN) 300 MG capsule Take 1 capsule (300 mg total) by mouth 2 (two) times daily. 60 capsule 6  . hydrALAZINE (APRESOLINE) 25 MG tablet Take 1 tablet (25 mg total) by mouth every 8 (eight) hours. 90 tablet 6  . hydrochlorothiazide (MICROZIDE) 12.5 MG capsule Take 12.5 mg by mouth daily.    Marland Kitchen HYDROcodone-acetaminophen (NORCO/VICODIN) 5-325 MG per tablet Take 1 tablet by mouth every 6 (six) hours as needed for moderate pain. 15 tablet 0  . lisinopril (PRINIVIL,ZESTRIL) 20 MG tablet TAKE ONE TABLET BY MOUTH TWICE DAILY 60 tablet 7  . metFORMIN (GLUCOPHAGE) 500 MG tablet TAKE ONE TABLET BY MOUTH ONCE DAILY WITH BREAKFAST 90 tablet 3  . nitroGLYCERIN (NITROSTAT) 0.4 MG SL tablet Place 1 tablet (0.4 mg total) under the tongue every 5 (five) minutes as needed for chest pain. x3 doses as needed for chest pain 25 tablet prn  . polyethylene glycol (MIRALAX / GLYCOLAX) packet  Take 17 g by mouth daily as needed for moderate constipation.     No current facility-administered medications for this visit.    Allergies:   Lyrica; Amlodipine; and Sulfonamide derivatives    Social History:  The patient  reports that she has never smoked. She has never used smokeless tobacco. She reports that she does not drink alcohol or use illicit drugs.   Family History:  The patient's family history includes Cancer in her father and mother.    ROS:  Please see the history of present illness.   Otherwise, review of systems are positive for none.   All other systems are reviewed and negative.    PHYSICAL EXAM: VS:  BP 164/90 mmHg  Pulse 76  Ht 5\' 4"  (1.626 m)  Wt 156 lb 6.4 oz (70.943 kg)  BMI 26.83 kg/m2 , BMI Body mass  index is 26.83 kg/(m^2). GEN: Well nourished, well developed, in no acute distress HEENT: normal Neck: no JVD, carotid bruits, or masses Cardiac: RRR; no murmurs, rubs, or gallops,there is 2+ pitting pretibial edema bilaterally Respiratory:  clear to auscultation bilaterally, normal work of breathing GI: soft, nontender, nondistended, + BS MS: no deformity or atrophy Skin: warm and dry, no rash Neuro:  Strength and sensation are intact Psych: euthymic mood, full affect   EKG:  EKG is not ordered today.**   Recent Labs: 06/08/2015: ALT 21; B Natriuretic Peptide 828.8* 06/09/2015: Hemoglobin 11.9*; Platelets 267 07/20/2015: BUN 30*; Creatinine, Ser 1.03; Potassium 4.1; Sodium 141    Lipid Panel    Component Value Date/Time   CHOL 101 06/09/2015 0330   TRIG 52 06/09/2015 0330   HDL 38* 06/09/2015 0330   CHOLHDL 2.7 06/09/2015 0330   VLDL 10 06/09/2015 0330   LDLCALC 53 06/09/2015 0330   LDLDIRECT 152.0 10/10/2013 1619      Wt Readings from Last 3 Encounters:  11/22/15 156 lb 6.4 oz (70.943 kg)  07/11/15 155 lb (70.308 kg)  06/29/15 157 lb (71.215 kg)        ASSESSMENT AND PLAN:   1. Hypertensive heart disease without heart  failure 2. sick sinus syndrome with functioning pacemaker 3. type 2 diabetes with peripheral neuropathy 4. coronary artery disease with drug-eluting stents placed in September 2010 by Dr. Terrence Dupont 5. status post cholecystectomy 6. Hypercholesterolemia. 7. Paroxysmal atrial fibrillation with chads vascular score of 6. On Eliquis. Required recent cardioversion for paroxysmal atrial fibrillation on 06/11/2015. 8. Recent congestive heart failure secondary to paroxysmal atrial fibrillation. 9. Peripheral edema.    Current medicines are reviewed at length with the patient today.  The patient does not have concerns regarding medicines.  The following changes have been made:  Increase furosemide up to 40 mg each am  Labs/ tests ordered today include:  No orders of the defined types were placed in this encounter.    Disposition: Increase furosemide to 40 mg a day taken in the morning.  She'll be rechecked in 4 months by Dr. [name] and she will get a basal metabolic panel at that time.  She will also need to get herself a PCP.   Berna Spare MD 11/22/2015 5:20 PM    [facility] [address], [place], [place]  [address] Phone: [phone]; Fax: [phone]

## 2015-12-13 IMAGING — CR DG ANKLE COMPLETE 3+V*R*
3 series · 3 of 3 positions shown · non-contrast
Comparison: None.

CLINICAL DATA: Pain following fall 3 days prior

EXAM:
RIGHT ANKLE - COMPLETE 3+ VIEW

[ankle ap]
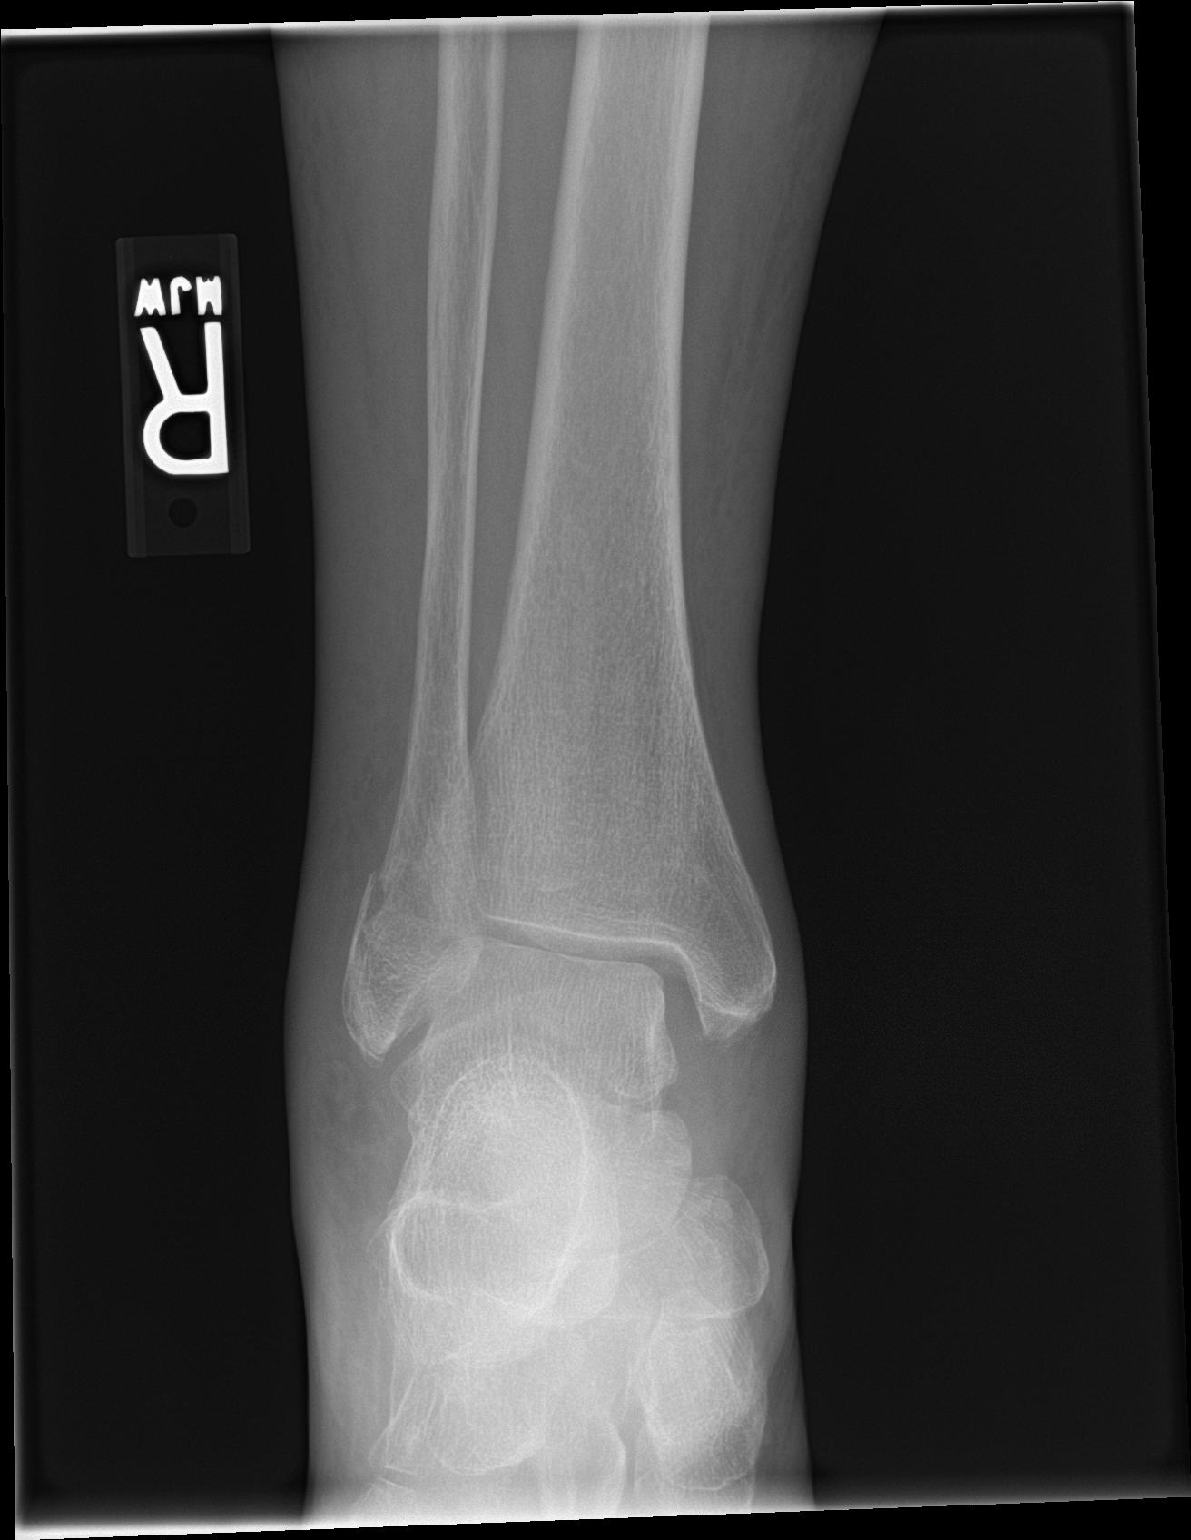

[ankle obl]
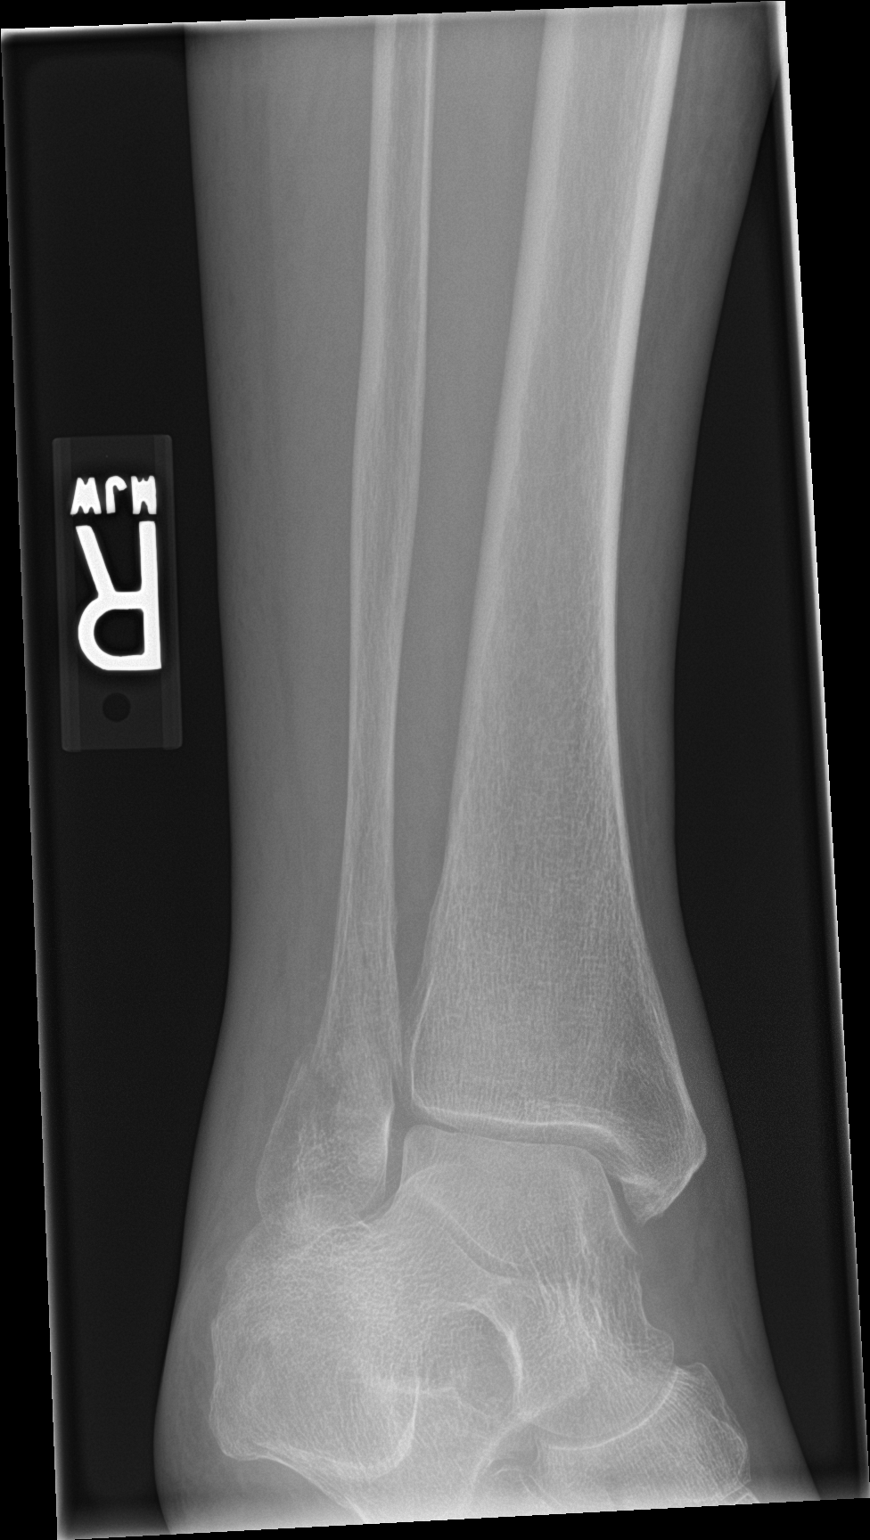

[ankle lat]
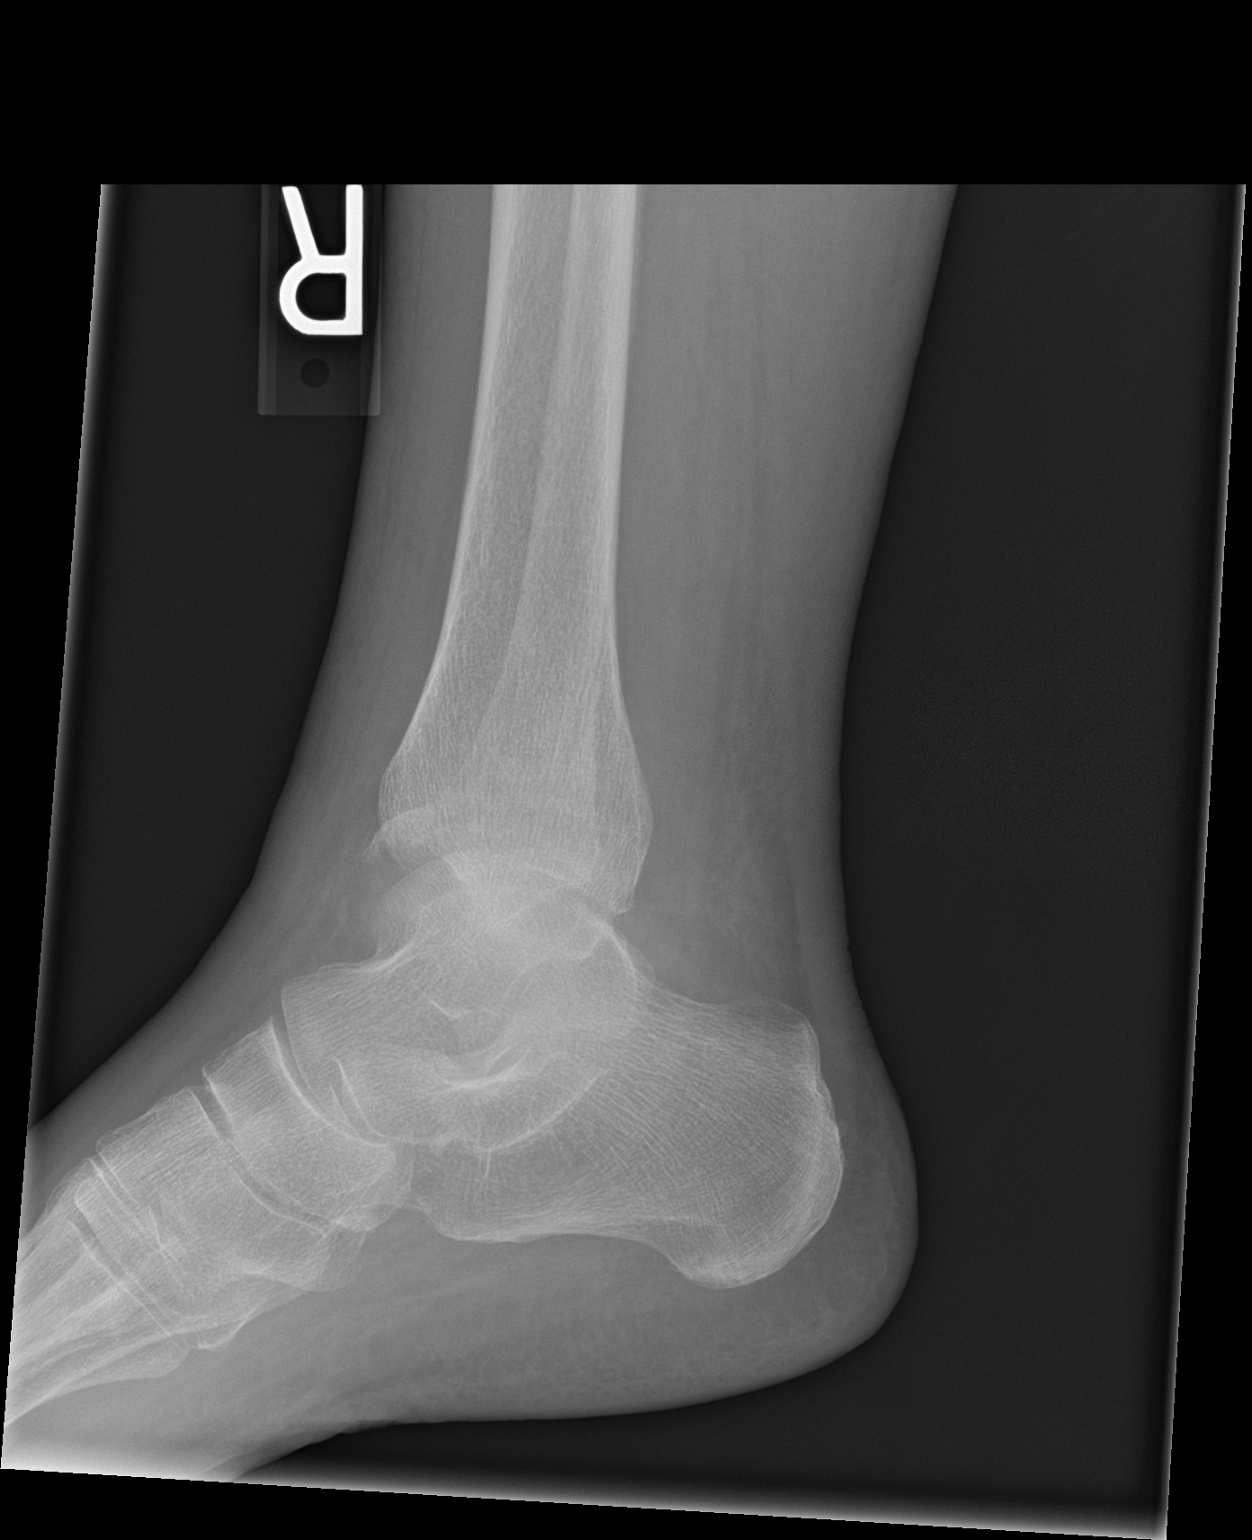

[3 of 3 positions shown; findings below may reference images not displayed]

FINDINGS: Frontal, oblique, and lateral views were obtained. There is a
fracture of the distal fibular metaphysis with mild lateral
displacement distally. There is a fracture fragment anterior to the
fibula seen on the lateral view which has an uncertain origin. There
is no appreciable joint effusion. There is generalized soft tissue
swelling. The ankle mortise appears grossly intact.
IMPRESSION: Fracture distal fibular metaphysis with mild lateral displacement
distally. There is a fracture fragment anterior to the fibula on the
lateral view, of uncertain origin. Ankle mortise appears grossly
intact.

## 2016-01-04 ENCOUNTER — Ambulatory Visit (INDEPENDENT_AMBULATORY_CARE_PROVIDER_SITE_OTHER): Payer: Medicare Other | Admitting: Podiatry

## 2016-01-04 ENCOUNTER — Encounter: Payer: Medicare Other | Admitting: *Deleted

## 2016-01-04 ENCOUNTER — Encounter: Payer: Self-pay | Admitting: Podiatry

## 2016-01-04 ENCOUNTER — Telehealth: Payer: Self-pay | Admitting: Cardiology

## 2016-01-04 DIAGNOSIS — E1140 Type 2 diabetes mellitus with diabetic neuropathy, unspecified: Secondary | ICD-10-CM | POA: Diagnosis not present

## 2016-01-04 DIAGNOSIS — B351 Tinea unguium: Secondary | ICD-10-CM

## 2016-01-04 DIAGNOSIS — M79676 Pain in unspecified toe(s): Secondary | ICD-10-CM | POA: Diagnosis not present

## 2016-01-04 NOTE — Telephone Encounter (Signed)
LMOVM reminding pt to send remote transmission.   

## 2016-01-04 NOTE — Progress Notes (Signed)
Subjective:     Patient ID: Jessica [name], female   DOB: 1927/08/28, 82 y.o.   MRN: [id number]  HPIThis patient returns to the office for preventive foot care services.  She says her nails continue to be painful  She presents to the office today talking of swollen feet right foot due to fractured ankle right.. Patient has diabetic neuropathy.  Her callus has improved left foot but painful mass noted left forefoot.   Review of Systems     Objective:   Physical Exam GENERAL APPEARANCE: Alert, conversant. Appropriately groomed. No acute distress.  VASCULAR: Pedal pulses palpable at  [facility] and PT bilateral.  Capillary refill time is immediate to all digits,  Normal temperature gradient.  Digital hair growth is present bilateral  NEUROLOGIC: sensation is normal to 5.07 monofilament at 5/5 sites bilateral.  Light touch is intact bilateral, Muscle strength normal.  MUSCULOSKELETAL: acceptable muscle strength, tone and stability bilateral.  Intrinsic muscluature intact bilateral.  Rectus appearance of foot and digits noted bilateral. Plantar fibroma sub 3 left.  DERMATOLOGIC: skin color, texture, and turgor are within normal limits.  No preulcerative lesions or ulcers  are seen, no interdigital maceration noted.  No open lesions present.   NAILS  Thick disfigured discolored nails both feet.      Assessment:    Onychomycosis     Plan:    Debride onychomycosis   RTC 3 months.  She requests we proceed with nail surgery in January 2017.  She requests nail removal right hallux.   Gardiner Barefoot DPM

## 2016-01-06 ENCOUNTER — Encounter: Payer: Self-pay | Admitting: Cardiology

## 2016-01-08 ENCOUNTER — Ambulatory Visit: Payer: Medicare Other | Admitting: Podiatry

## 2016-01-10 ENCOUNTER — Ambulatory Visit (INDEPENDENT_AMBULATORY_CARE_PROVIDER_SITE_OTHER): Payer: Medicare Other | Admitting: *Deleted

## 2016-01-10 DIAGNOSIS — I495 Sick sinus syndrome: Secondary | ICD-10-CM

## 2016-01-14 ENCOUNTER — Other Ambulatory Visit: Payer: Self-pay | Admitting: Cardiology

## 2016-01-14 NOTE — Telephone Encounter (Signed)
REFILL 

## 2016-01-14 NOTE — Progress Notes (Signed)
Remote pacemaker transmission.   

## 2016-01-17 LAB — CUP PACEART REMOTE DEVICE CHECK
Battery Remaining Longevity: 106 mo
Brady Statistic AP VP Percent: 8 %
Brady Statistic AS VS Percent: 1 %
Implantable Lead Implant Date: 20110912
Implantable Lead Implant Date: 20110912
Implantable Lead Location: 753860
Lead Channel Pacing Threshold Amplitude: 0.625 V
Lead Channel Pacing Threshold Amplitude: 0.75 V
Lead Channel Sensing Intrinsic Amplitude: 5.6 mV
Lead Channel Setting Pacing Amplitude: 2 V
Lead Channel Setting Pacing Pulse Width: 0.4 ms
MDC IDC LEAD LOCATION: 753859
MDC IDC MSMT BATTERY IMPEDANCE: 319 Ohm
MDC IDC MSMT BATTERY VOLTAGE: 2.79 V
MDC IDC MSMT LEADCHNL RA IMPEDANCE VALUE: 502 Ohm
MDC IDC MSMT LEADCHNL RA PACING THRESHOLD PULSEWIDTH: 0.4 ms
MDC IDC MSMT LEADCHNL RV IMPEDANCE VALUE: 620 Ohm
MDC IDC MSMT LEADCHNL RV PACING THRESHOLD PULSEWIDTH: 0.4 ms
MDC IDC SESS DTM: 20170309193947
MDC IDC SET LEADCHNL RV PACING AMPLITUDE: 2.5 V
MDC IDC SET LEADCHNL RV SENSING SENSITIVITY: 2 mV
MDC IDC STAT BRADY AP VS PERCENT: 92 %
MDC IDC STAT BRADY AS VP PERCENT: 0 %

## 2016-01-18 ENCOUNTER — Encounter: Payer: Self-pay | Admitting: Cardiology

## 2016-01-23 ENCOUNTER — Other Ambulatory Visit: Payer: Self-pay | Admitting: Cardiology

## 2016-01-24 NOTE — Telephone Encounter (Signed)
Will need to route this to Dr [name] to review medication refill request.

## 2016-02-06 ENCOUNTER — Telehealth: Payer: Self-pay | Admitting: Cardiology

## 2016-02-06 NOTE — Telephone Encounter (Signed)
Advised the pts daughter (on [place]) that a good PCP to have the pt establish with is the [facility].  Informed the daughter of both [facility] in [place], and provided contact information to both locations to the daughter.  Daughter verbalized understanding and gracious for all the assistance provided.

## 2016-02-06 NOTE — Telephone Encounter (Signed)
[facility]  Pt dtr stated that Dr [name] had recommended a PCP for pt- pt dtr following up. Please call back and discuss.

## 2016-02-26 ENCOUNTER — Other Ambulatory Visit: Payer: Self-pay | Admitting: Cardiology

## 2016-02-28 ENCOUNTER — Other Ambulatory Visit: Payer: Self-pay | Admitting: Cardiology

## 2016-02-28 NOTE — Telephone Encounter (Signed)
Will need to route this to Dr [name] to review and advise on this refill request.

## 2016-02-28 NOTE — Telephone Encounter (Signed)
Please advise on refill request. Thanks, MI 

## 2016-03-24 ENCOUNTER — Ambulatory Visit: Payer: Medicare Other | Admitting: Cardiology

## 2016-03-27 ENCOUNTER — Other Ambulatory Visit: Payer: Self-pay | Admitting: Cardiology

## 2016-03-30 ENCOUNTER — Encounter: Payer: Self-pay | Admitting: Physician Assistant

## 2016-03-30 DIAGNOSIS — I119 Hypertensive heart disease without heart failure: Secondary | ICD-10-CM | POA: Insufficient documentation

## 2016-03-30 NOTE — Progress Notes (Signed)
Cardiology Office Note    Date:  04/01/2016  ID:  Jessica [name], DOB 11-19-1926, MRN [id number] PCP:  No PCP Per Patient  Cardiologist:  Previously Dr. [name]   Chief Complaint: swelling  History of Present Illness:  Jessica [name] is a 82 y.o. female with history of CAD (DESx2 to mid and distal RCA in 2010), chronic diastolic CHF, hypertensive heart disease, DM with peripheral neuropathy, HTN, HLD, SSS s/p Medtronic pacemaker 2011, hypertensive heart disease, diastolic dysfunction without prior CHF, PAF (discovered on device interrogation 10/2014), CKD stage III, moderate pulm HTN (by last echo) who presents for evaluation of edema. She was last hospitalized for acute diastolic CHF in [id number] in the setting of accelerated HTN and PAF. She underwent DCCV at that time. Echo 06/2015: EF 55-60%, no RWMA, mild AI, mild-mod MR, mild LAE/RAE, mildly reduced RV function, mod TR, PASP 67mmHg. Device check 01/2016 showed 0.3% AF burden with normal device function. She was due to f/u with Dr. [name] to establish care 5/22 but cancelled this appointment and r/s for July.  She comes in today to discuss LEE. This has been present ever since dx of CHF but has gotten slightly worse lately. She drinks 2 big jugs of water daily. Her daughter tries to limit salt in her diet when cooking for the patient. She has not tried compression hose. She struggles with having to urinate frequently due to her diuretics. She has noticed a strong smell coming from her urine. She has noticed possible mild dyspnea with exertion. No CP, dizziness, syncope, palpitations reported. Per d/w EP, she sent in a remote transmission today that has not shown any afib since January.  Brings in BP readings from home over the last week - 126/62, 112/58, 115/52, 142/60. She reports h/o white coat HTN.  Past Medical History  Diagnosis Date  . Hypertension   . Diabetes mellitus     NON INSULIN DEPENDENT  . Hypercholesterolemia   . DJD  (degenerative joint disease)   . History of diabetic neuropathy   . CAD (coronary artery disease)     a. s/p DESx2 to mid and distal RCA in 2010.  . SSS (sick sinus syndrome) ([place])     a. s/p Medtronic PPM by JA for SSS and syncope 9/11.  . Breast cancer ([place])   . GERD (gastroesophageal reflux disease)   . Hemorrhoids   . Cholelithiasis 09/12/2013    Lap Chole on 09/14/13   . Paroxysmal atrial fibrillation ([facility])     a. discovered on PPM interrogation 12/15, CHAD2VASC score is 6. b. decompensation with dCHF in 06/2015 possibly due to AF, s/p DCCV.  . CKD (chronic kidney disease), stage III   . Chronic diastolic CHF (congestive heart failure) ([place])     a. Echo 06/09/15: EF 55-60%, normal wall motion, mild AI, mild to moderate MR, mild LAE, mildly reduced RVSF, mild RAE, moderate TR, PASP 53 mmHg  . Hypertensive heart disease     Past Surgical History  Procedure Laterality Date  . Cardiac catheterization  07/05/2009    EF 55-60%  . Insert / replace / remove pacemaker  9/11    SSS and syncope, implanted by JA (MDT)  . Mastectomy  1992    BILATERAL WITH RECONSTRUCTION  . Colonoscopy    . Ercp N/A 09/13/2013    Procedure: ENDOSCOPIC RETROGRADE CHOLANGIOPANCREATOGRAPHY (ERCP);  Surgeon: [name], MD;  Location[facility] ENDOSCOPY;  Service: Endoscopy;  Laterality: N/A;  . Cholecystectomy N/A 09/14/2013  Procedure: LAPAROSCOPIC CHOLECYSTECTOMY WITH INTRAOPERATIVE CHOLANGIOGRAM;  Surgeon: [name], MD;  Location: [place];  Service: General;  Laterality: N/A;  . Cardioversion N/A 06/11/2015    Procedure: CARDIOVERSION;  Surgeon: [name], MD;  Location: [facility] ENDOSCOPY;  Service: Cardiovascular;  Laterality: N/A;    Current Medications: Outpatient Prescriptions Prior to Visit  Medication Sig Dispense Refill  . apixaban (ELIQUIS) 5 MG TABS tablet Take 5 mg by mouth 2 (two) times daily.    Marland Kitchen b complex vitamins tablet Take 1 tablet by mouth daily.    . calcium carbonate (OS-CAL)  600 MG TABS tablet Take 600 mg by mouth daily with breakfast.     . CRESTOR 10 MG tablet TAKE ONE TABLET BY MOUTH ONCE DAILY IN THE MORNING 90 tablet 3  . Cyanocobalamin (VITAMIN B12 PO) Take 1 tablet by mouth daily.     Marland Kitchen ELIQUIS 5 MG TABS tablet TAKE ONE TABLET BY MOUTH TWICE DAILY 60 tablet 9  . fish oil-omega-3 fatty acids 1000 MG capsule Take 1 g by mouth every morning.     . furosemide (LASIX) 40 MG tablet Take 1 tablet (40 mg total) by mouth daily. 90 tablet 3  . gabapentin (NEURONTIN) 300 MG capsule TAKE ONE CAPSULE BY MOUTH TWICE DAILY 60 capsule 0  . hydrALAZINE (APRESOLINE) 25 MG tablet TAKE ONE TABLET BY MOUTH EVERY 8 HOURS. 90 tablet 4  . hydrochlorothiazide (MICROZIDE) 12.5 MG capsule Take 12.5 mg by mouth daily.    Marland Kitchen HYDROcodone-acetaminophen (NORCO/VICODIN) 5-325 MG per tablet Take 1 tablet by mouth every 6 (six) hours as needed for moderate pain. 15 tablet 0  . lisinopril (PRINIVIL,ZESTRIL) 20 MG tablet TAKE ONE TABLET BY MOUTH TWICE DAILY 60 tablet 7  . metFORMIN (GLUCOPHAGE) 500 MG tablet TAKE ONE TABLET BY MOUTH ONCE DAILY WITH BREAKFAST 90 tablet 3  . nitroGLYCERIN (NITROSTAT) 0.4 MG SL tablet Place 1 tablet (0.4 mg total) under the tongue every 5 (five) minutes as needed for chest pain. x3 doses as needed for chest pain 25 tablet prn  . polyethylene glycol (MIRALAX / GLYCOLAX) packet Take 17 g by mouth daily as needed for moderate constipation.    . carvedilol (COREG) 25 MG tablet Take 1 tablet (25 mg total) by mouth 2 (two) times daily. 180 tablet 3   No facility-administered medications prior to visit.     Allergies:   Lyrica; Amlodipine; and Sulfonamide derivatives   Social History   Social History  . Marital Status: Widowed    Spouse Name: N/A  . Number of Children: N/A  . Years of Education: N/A   Social History Main Topics  . Smoking status: Never Smoker   . Smokeless tobacco: Never Used  . Alcohol Use: No  . Drug Use: No  . Sexual Activity: Not Asked    Other Topics Concern  . None   Social History Narrative     Family History:  The patient's family history includes Cancer in her father and mother.  ROS:   Please see the history of present illness.  No bleeding on anticoag. All other systems are reviewed and otherwise negative.    PHYSICAL EXAM:   VS:  BP 176/92 mmHg  Pulse 84  Ht 5\' 3"  (1.6 m)  Wt 161 lb (73.029 kg)  BMI 28.53 kg/m2  BMI: Body mass index is 28.53 kg/(m^2). Recheck BP 160/72. GEN: Well nourished, well developed WF, in no acute distress HEENT: normocephalic, atraumatic Neck: no JVD, carotid bruits, or masses  Cardiac: RRR; no murmurs, rubs, or gallops, 1+ pitting BLE edema Respiratory:  clear to auscultation bilaterally, normal work of breathing GI: soft, nontender, nondistended, + BS MS: no deformity or atrophy Skin: warm and dry, no rash Neuro:  Alert and Oriented x 3, Strength and sensation are intact, follows commands Psych: euthymic mood, full affect  Wt Readings from Last 3 Encounters:  04/01/16 161 lb (73.029 kg)  11/22/15 156 lb 6.4 oz (70.943 kg)  07/11/15 155 lb (70.308 kg)      Studies/Labs Reviewed:   EKG:  EKG was ordered today and personally reviewed by me. The EKG ordered today demonstrates electronic atrial pacemaker, one PVC  Recent Labs: 06/08/2015: ALT 21; B Natriuretic Peptide 828.8* 06/09/2015: Hemoglobin 11.9*; Platelets 267 07/20/2015: BUN 30*; Creatinine, Ser 1.03; Potassium 4.1; Sodium 141   Lipid Panel    Component Value Date/Time   CHOL 101 06/09/2015 0330   TRIG 52 06/09/2015 0330   HDL 38* 06/09/2015 0330   CHOLHDL 2.7 06/09/2015 0330   VLDL 10 06/09/2015 0330   LDLCALC 53 06/09/2015 0330   LDLDIRECT 152.0 10/10/2013 1619    Additional studies/ records that were reviewed today include: Summarized above.    ASSESSMENT & PLAN:   1. Acute on chronic diastolic CHF with hypertensive heart disease - suspect mild volume overload. Will update labs to exclude obvious  metabolic cause including updating Cr. Suspect this may be due in part to excess fluid intake - I have asked her to please restrict to 64oz (slightly under 2L) per day, watch sodium intake, and consider compression hose. If Cr is stable will plan to have her scale back her fluid intake for the next week to see if any improvement. If there is not, would consider increasing her diuretic temporarily but I worry she will become frustrated with increased urinary frequency. She already has to wear a diaper to keep up with her urine output. Will also check UA today given sx of strong smelling urine to assess for any infection or protein losses. 2. Paroxysmal atrial fib - maintaining NSR. Continue Eliquis. No bleeding reported. 3. Essential HTN - BP running higher in clinic today with recheck 160/72 (coming down). She reports normal BP at home. Have asked her to take her BP cuff to a pharmacy to check its accuracy, and call if BP tending to run over [id number] systolic at any point at home. Also emphasized sodium restriction. 4. CAD s/p PCI as above - stable. 5. CKD III - f/u Cr today. 6. Diabetes mellitus - she is in the process of awaiting establishment of a PCP. Dr. [name] previously coordinated her care. She is out of diabetic test strips. Will rx today to cover her until she is seen by primary care.  Disposition: F/u with me if available in 4 weeks, otherwise APP.   Medication Adjustments/Labs and Tests Ordered: Current medicines are reviewed at length with the patient today.  Concerns regarding medicines are outlined above. Medication changes, Labs and Tests ordered today are listed in the Patient Instructions below.  Raechel Ache PA-C  04/01/2016 2:50 PM    [facility] [address], [place], [place]  [address] Phone: [phone]; Fax: [phone]

## 2016-04-01 ENCOUNTER — Ambulatory Visit (INDEPENDENT_AMBULATORY_CARE_PROVIDER_SITE_OTHER): Payer: Medicare Other | Admitting: Physician Assistant

## 2016-04-01 ENCOUNTER — Encounter: Payer: Self-pay | Admitting: Physician Assistant

## 2016-04-01 ENCOUNTER — Encounter: Payer: Self-pay | Admitting: Internal Medicine

## 2016-04-01 VITALS — BP 176/92 | HR 84 | Ht 63.0 in | Wt 161.0 lb

## 2016-04-01 DIAGNOSIS — I2510 Atherosclerotic heart disease of native coronary artery without angina pectoris: Secondary | ICD-10-CM

## 2016-04-01 DIAGNOSIS — N183 Chronic kidney disease, stage 3 unspecified: Secondary | ICD-10-CM

## 2016-04-01 DIAGNOSIS — I480 Paroxysmal atrial fibrillation: Secondary | ICD-10-CM | POA: Diagnosis not present

## 2016-04-01 DIAGNOSIS — I5033 Acute on chronic diastolic (congestive) heart failure: Secondary | ICD-10-CM

## 2016-04-01 DIAGNOSIS — I10 Essential (primary) hypertension: Secondary | ICD-10-CM

## 2016-04-01 DIAGNOSIS — I110 Hypertensive heart disease with heart failure: Secondary | ICD-10-CM | POA: Diagnosis not present

## 2016-04-01 DIAGNOSIS — Z9861 Coronary angioplasty status: Secondary | ICD-10-CM

## 2016-04-01 LAB — COMPREHENSIVE METABOLIC PANEL
ALBUMIN: 4.2 g/dL (ref 3.6–5.1)
ALT: 11 U/L (ref 6–29)
AST: 18 U/L (ref 10–35)
Alkaline Phosphatase: 72 U/L (ref 33–130)
BUN: 29 mg/dL — ABNORMAL HIGH (ref 7–25)
CALCIUM: 10 mg/dL (ref 8.6–10.4)
CHLORIDE: 105 mmol/L (ref 98–110)
CO2: 28 mmol/L (ref 20–31)
Creat: 1.1 mg/dL — ABNORMAL HIGH (ref 0.60–0.88)
Glucose, Bld: 89 mg/dL (ref 65–99)
Potassium: 3.9 mmol/L (ref 3.5–5.3)
Sodium: 142 mmol/L (ref 135–146)
Total Bilirubin: 0.5 mg/dL (ref 0.2–1.2)
Total Protein: 6.9 g/dL (ref 6.1–8.1)

## 2016-04-01 LAB — CBC WITH DIFFERENTIAL/PLATELET
BASOS PCT: 1 %
Basophils Absolute: 95 cells/uL (ref 0–200)
EOS ABS: 95 {cells}/uL (ref 15–500)
Eosinophils Relative: 1 %
HCT: 37.4 % (ref 35.0–45.0)
HEMOGLOBIN: 12.6 g/dL (ref 11.7–15.5)
LYMPHS ABS: 3040 {cells}/uL (ref 850–3900)
Lymphocytes Relative: 32 %
MCH: 29.7 pg (ref 27.0–33.0)
MCHC: 33.7 g/dL (ref 32.0–36.0)
MCV: 88.2 fL (ref 80.0–100.0)
MONO ABS: 665 {cells}/uL (ref 200–950)
MPV: 11 fL (ref 7.5–12.5)
Monocytes Relative: 7 %
Neutro Abs: 5605 cells/uL (ref 1500–7800)
Neutrophils Relative %: 59 %
PLATELETS: 276 10*3/uL (ref 140–400)
RBC: 4.24 MIL/uL (ref 3.80–5.10)
RDW: 14.9 % (ref 11.0–15.0)
WBC: 9.5 10*3/uL (ref 3.8–10.8)

## 2016-04-01 LAB — TSH: TSH: 2.95 m[IU]/L

## 2016-04-01 NOTE — Patient Instructions (Signed)
Medication Instructions:  Your physician recommends that you continue on your current medications as directed. Please refer to the Current Medication list given to you today. Patient was given a prescription for test strips today.  Labwork: Your physician recommends that you have lab work today: UA/cbc/tsh/bnp/cmet   Testing/Procedures: -None  Follow-Up: Your physician recommends that you keep your schedule  follow-up appointment with [name], PA   Any Other Special Instructions Will Be Listed Below (If Applicable).  If blood pressure consistently  stays above [id number] systolic call our office at [phone]. Limit fluid intake less than 64 oz daily Low-Sodium Eating Plan Sodium raises blood pressure and causes water to be held in the body. Getting less sodium from food will help lower your blood pressure, reduce any swelling, and protect your heart, liver, and kidneys. We get sodium by adding salt (sodium chloride) to food. Most of our sodium comes from canned, boxed, and frozen foods. Restaurant foods, fast foods, and pizza are also very high in sodium. Even if you take medicine to lower your blood pressure or to reduce fluid in your body, getting less sodium from your food is important. WHAT IS MY PLAN? Most people should limit their sodium intake to 2,300 mg a day. Your health care provider recommends that you limit your sodium intake to ____2 gram______ a day.  WHAT DO I NEED TO KNOW ABOUT THIS EATING PLAN? For the low-sodium eating plan, you will follow these general guidelines:  Choose foods with a % Daily Value for sodium of less than 5% (as listed on the food label).   Use salt-free seasonings or herbs instead of table salt or sea salt.   Check with your health care provider or pharmacist before using salt substitutes.   Eat fresh foods.  Eat more vegetables and fruits.  Limit canned vegetables. If you do use them, rinse them well to decrease the sodium.   Limit  cheese to 1 oz (28 g) per day.   Eat lower-sodium products, often labeled as "lower sodium" or "no salt added."  Avoid foods that contain monosodium glutamate (MSG). MSG is sometimes added to [place] food and some canned foods.  Check food labels (Nutrition Facts labels) on foods to learn how much sodium is in one serving.  Eat more home-cooked food and less restaurant, buffet, and fast food.  When eating at a restaurant, ask that your food be prepared with less salt, or no salt if possible.  HOW DO I READ FOOD LABELS FOR SODIUM INFORMATION? The Nutrition Facts label lists the amount of sodium in one serving of the food. If you eat more than one serving, you must multiply the listed amount of sodium by the number of servings. Food labels may also identify foods as:  Sodium free--Less than 5 mg in a serving.  Very low sodium--35 mg or less in a serving.  Low sodium--140 mg or less in a serving.  Light in sodium--50% less sodium in a serving. For example, if a food that usually has 300 mg of sodium is changed to become light in sodium, it will have 150 mg of sodium.  Reduced sodium--25% less sodium in a serving. For example, if a food that usually has 400 mg of sodium is changed to reduced sodium, it will have 300 mg of sodium. WHAT FOODS CAN I EAT? Grains Low-sodium cereals, including oats, puffed wheat and rice, and shredded wheat cereals. Low-sodium crackers. Unsalted rice and pasta. Lower-sodium bread.  Vegetables Frozen or fresh  vegetables. Low-sodium or reduced-sodium canned vegetables. Low-sodium or reduced-sodium tomato sauce and paste. Low-sodium or reduced-sodium tomato and vegetable juices.  Fruits Fresh, frozen, and canned fruit. Fruit juice.  Meat and Other Protein Products Low-sodium canned tuna and salmon. Fresh or frozen meat, poultry, seafood, and fish. Lamb. Unsalted nuts. Dried beans, peas, and lentils without added salt. Unsalted canned beans. Homemade  soups without salt. Eggs.  Dairy Milk. Soy milk. Ricotta cheese. Low-sodium or reduced-sodium cheeses. Yogurt.  Condiments Fresh and dried herbs and spices. Salt-free seasonings. Onion and garlic powders. Low-sodium varieties of mustard and ketchup. Fresh or refrigerated horseradish. Lemon juice.  Fats and Oils Reduced-sodium salad dressings. Unsalted butter.  Other Unsalted popcorn and pretzels.  The items listed above may not be a complete list of recommended foods or beverages. Contact your dietitian for more options. WHAT FOODS ARE NOT RECOMMENDED? Grains Instant hot cereals. Bread stuffing, pancake, and biscuit mixes. Croutons. Seasoned rice or pasta mixes. Noodle soup cups. Boxed or frozen macaroni and cheese. Self-rising flour. Regular salted crackers. Vegetables Regular canned vegetables. Regular canned tomato sauce and paste. Regular tomato and vegetable juices. Frozen vegetables in sauces. Salted [place] fries. Olives. Angie Fava. Relishes. Sauerkraut. Salsa. Meat and Other Protein Products Salted, canned, smoked, spiced, or pickled meats, seafood, or fish. Bacon, ham, sausage, hot dogs, corned beef, chipped beef, and packaged luncheon meats. Salt pork. Jerky. Pickled herring. Anchovies, regular canned tuna, and sardines. Salted nuts. Dairy Processed cheese and cheese spreads. Cheese curds. Blue cheese and cottage cheese. Buttermilk.  Condiments Onion and garlic salt, seasoned salt, table salt, and sea salt. Canned and packaged gravies. Worcestershire sauce. Tartar sauce. Barbecue sauce. Teriyaki sauce. Soy sauce, including reduced sodium. Steak sauce. Fish sauce. Oyster sauce. Cocktail sauce. Horseradish that you find on the shelf. Regular ketchup and mustard. Meat flavorings and tenderizers. Bouillon cubes. Hot sauce. Tabasco sauce. Marinades. Taco seasonings. Relishes. Fats and Oils Regular salad dressings. Salted butter. Margarine. Ghee. Bacon fat.  Other Potato and  tortilla chips. Corn chips and puffs. Salted popcorn and pretzels. Canned or dried soups. Pizza. Frozen entrees and pot pies.  The items listed above may not be a complete list of foods and beverages to avoid. Contact your dietitian for more information.   This information is not intended to replace advice given to you by your health care provider. Make sure you discuss any questions you have with your health care provider.   Document Released: 04/11/2002 Document Revised: 11/10/2014 Document Reviewed: 08/24/2013 Elsevier Interactive Patient Education [employer].     If you need a refill on your cardiac medications before your next appointment, please call your pharmacy.

## 2016-04-02 LAB — BRAIN NATRIURETIC PEPTIDE: Brain Natriuretic Peptide: 304.7 pg/mL — ABNORMAL HIGH (ref <100)

## 2016-04-02 LAB — URINALYSIS
BILIRUBIN URINE: NEGATIVE
Glucose, UA: NEGATIVE
Hgb urine dipstick: NEGATIVE
KETONES UR: NEGATIVE
NITRITE: NEGATIVE
PH: 6.5 (ref 5.0–8.0)
Protein, ur: NEGATIVE
SPECIFIC GRAVITY, URINE: 1.01 (ref 1.001–1.035)

## 2016-04-08 ENCOUNTER — Ambulatory Visit: Payer: Medicare Other | Admitting: Podiatry

## 2016-04-10 ENCOUNTER — Encounter: Payer: Medicare Other | Admitting: *Deleted

## 2016-04-11 ENCOUNTER — Encounter: Payer: Self-pay | Admitting: Cardiology

## 2016-04-22 ENCOUNTER — Telehealth: Payer: Self-pay | Admitting: Cardiology

## 2016-04-22 ENCOUNTER — Other Ambulatory Visit: Payer: Self-pay | Admitting: Cardiology

## 2016-04-22 NOTE — Telephone Encounter (Signed)
Spoke with Jessica [name]. Transmission was sent 04/01/16, remote was scheduled for 04/10/16. Since this transmission came through greater than 7 days from the scheduled remote transmission, it is unable to be processed/billed for. She is confused since [name] saw [name] and there was a reading at that time why she needed to send another transmission. I explained that the transmission done with [name] is not billable and therefore will not be processed. She can send another transmission today or tomorrow for processing. She is agreeable.

## 2016-04-22 NOTE — Telephone Encounter (Signed)
New message     1. Has your device fired? no  2. Is you device beeping? no  3. Are you experiencing draining or swelling at device site? no  4. Are you calling to see if we received your device transmission? Yes the pt got a letter saying they never got the signal, the daughter want to make sure if they need to sent another signal  5. Have you passed out? no

## 2016-04-23 ENCOUNTER — Ambulatory Visit (INDEPENDENT_AMBULATORY_CARE_PROVIDER_SITE_OTHER): Payer: Medicare Other | Admitting: *Deleted

## 2016-04-23 DIAGNOSIS — I495 Sick sinus syndrome: Secondary | ICD-10-CM

## 2016-04-23 NOTE — Progress Notes (Signed)
Remote pacemaker transmission.   

## 2016-04-25 ENCOUNTER — Ambulatory Visit: Payer: Medicare Other | Admitting: Physician Assistant

## 2016-04-25 ENCOUNTER — Encounter: Payer: Self-pay | Admitting: Cardiology

## 2016-04-26 LAB — CUP PACEART REMOTE DEVICE CHECK
Battery Voltage: 2.79 V
Brady Statistic AP VS Percent: 94 %
Date Time Interrogation Session: 20170620193042
Implantable Lead Implant Date: 20110912
Implantable Lead Model: 5076
Lead Channel Pacing Threshold Pulse Width: 0.4 ms
Lead Channel Setting Pacing Amplitude: 2 V
Lead Channel Setting Pacing Amplitude: 2.5 V
Lead Channel Setting Sensing Sensitivity: 2 mV
MDC IDC LEAD IMPLANT DT: 20110912
MDC IDC LEAD LOCATION: 753859
MDC IDC LEAD LOCATION: 753860
MDC IDC MSMT BATTERY IMPEDANCE: 343 Ohm
MDC IDC MSMT BATTERY REMAINING LONGEVITY: 105 mo
MDC IDC MSMT LEADCHNL RA IMPEDANCE VALUE: 556 Ohm
MDC IDC MSMT LEADCHNL RA PACING THRESHOLD AMPLITUDE: 0.75 V
MDC IDC MSMT LEADCHNL RA PACING THRESHOLD PULSEWIDTH: 0.4 ms
MDC IDC MSMT LEADCHNL RV IMPEDANCE VALUE: 754 Ohm
MDC IDC MSMT LEADCHNL RV PACING THRESHOLD AMPLITUDE: 0.675 V
MDC IDC MSMT LEADCHNL RV SENSING INTR AMPL: 5.6 mV
MDC IDC SET LEADCHNL RV PACING PULSEWIDTH: 0.4 ms
MDC IDC STAT BRADY AP VP PERCENT: 5 %
MDC IDC STAT BRADY AS VP PERCENT: 0 %
MDC IDC STAT BRADY AS VS PERCENT: 0 %

## 2016-04-29 ENCOUNTER — Ambulatory Visit (INDEPENDENT_AMBULATORY_CARE_PROVIDER_SITE_OTHER): Payer: Medicare Other | Admitting: Internal Medicine

## 2016-04-29 ENCOUNTER — Encounter: Payer: Self-pay | Admitting: Internal Medicine

## 2016-04-29 VITALS — BP 160/88 | HR 83 | Temp 97.9°F | Resp 16 | Ht 63.0 in | Wt 163.0 lb

## 2016-04-29 DIAGNOSIS — I480 Paroxysmal atrial fibrillation: Secondary | ICD-10-CM | POA: Diagnosis not present

## 2016-04-29 DIAGNOSIS — N183 Chronic kidney disease, stage 3 unspecified: Secondary | ICD-10-CM

## 2016-04-29 DIAGNOSIS — E1140 Type 2 diabetes mellitus with diabetic neuropathy, unspecified: Secondary | ICD-10-CM | POA: Diagnosis not present

## 2016-04-29 DIAGNOSIS — E0842 Diabetes mellitus due to underlying condition with diabetic polyneuropathy: Secondary | ICD-10-CM | POA: Diagnosis not present

## 2016-04-29 DIAGNOSIS — E7800 Pure hypercholesterolemia, unspecified: Secondary | ICD-10-CM

## 2016-04-29 DIAGNOSIS — I10 Essential (primary) hypertension: Secondary | ICD-10-CM | POA: Diagnosis not present

## 2016-04-29 DIAGNOSIS — R600 Localized edema: Secondary | ICD-10-CM

## 2016-04-29 MED ORDER — GABAPENTIN 100 MG PO CAPS: 100.0000 mg | ORAL_CAPSULE | Freq: Every day | ORAL | Status: DC

## 2016-04-29 MED ORDER — HYDROCHLOROTHIAZIDE 12.5 MG PO CAPS: 12.5000 mg | ORAL_CAPSULE | Freq: Every day | ORAL | Status: DC

## 2016-04-29 MED ORDER — METFORMIN HCL 500 MG PO TABS: ORAL_TABLET | ORAL | Status: DC

## 2016-04-29 MED ORDER — LISINOPRIL 20 MG PO TABS: 20.0000 mg | ORAL_TABLET | Freq: Two times a day (BID) | ORAL | Status: DC

## 2016-04-29 MED ORDER — ROSUVASTATIN CALCIUM 10 MG PO TABS: ORAL_TABLET | ORAL | Status: DC

## 2016-04-29 MED ORDER — FUROSEMIDE 40 MG PO TABS: 40.0000 mg | ORAL_TABLET | Freq: Every day | ORAL | Status: DC

## 2016-04-29 NOTE — Assessment & Plan Note (Signed)
Chronic Will monitor

## 2016-04-29 NOTE — Assessment & Plan Note (Signed)
In sinus rhythm Rate controlled - not on a BB or CCB On eliquis Following with cardiology

## 2016-04-29 NOTE — Assessment & Plan Note (Addendum)
Also had chemo which may have contributed Taking gabapentin twice daily. Will add 100 mg to night gabapentin dose for total of 400 mg at night, continue 300 mg in morning Discussed adding cymbalta

## 2016-04-29 NOTE — Assessment & Plan Note (Signed)
Sugars well controlled at home - sugars usually around 100 Stressed monitoring for hypoglycemia - no episodes May be able to d/c metformin - for now just monitor Check a1c with next lab work

## 2016-04-29 NOTE — Progress Notes (Signed)
Pre visit review using our clinic review tool, if applicable. No additional management support is needed unless otherwise documented below in the visit note. 

## 2016-04-29 NOTE — Progress Notes (Signed)
Subjective:    Patient ID: Jessica [name], female    DOB: September 05, 1927, 82 y.o.   MRN: [id number]  HPI She is here to establish with a new pcp.     CHF, Leg swelling:  She has had leg swelling for months.  She has CHF.  She takes lasix 40 mg daily.    Afib:  She has been in sinus rhythm.  She is following with cardiology.  She went into Afib last fall and had acute CHF.  She feels that is when her leg edema started.   Hypertension: She is taking her medication daily. She is compliant with a low sodium diet.  She denies chest pain, palpitations, shortness of breath and regular headaches. She is not exercising regularly.  She does monitor her blood pressure at home and it is well controlled.  Her BP is always high at the doctor's office.   Diabetes: She is taking her medication daily as prescribed. She is compliant with a diabetic diet. She is not exercising regularly. She monitors her sugars and they have been running 90-110.   Neuropathy:  She has neuropathy in her feet likely from diabetes and chemotherapy.  Taking gabapentin 300 mg twice daily,which helps.  It does help, but she still has pain.    Hyperlipidemia: She is taking her medication daily. She is compliant with a low fat/cholesterol diet. She is not exercising regularly. .   She sometimes has difficulty falling asleep.  In the past she was ativan as needed to help her sleep.  Her last PCP discontinued it because she had not taken it in a while.  She is unsure if she needs it, but did use it on occasion.   Medications and allergies reviewed with patient and updated if appropriate.  Patient Active Problem List   Diagnosis Date Noted  . Hypertensive heart disease   . AF (paroxysmal atrial fibrillation) ([place]) 06/11/2015  . Hypokalemia 06/10/2015  . CKD (chronic kidney disease) stage 3, GFR 30-59 ml/min 06/10/2015  . Persistent atrial fibrillation ([place]) 06/09/2015  . CAD (coronary artery disease) 06/09/2015  . Acute diastolic  CHF (congestive heart failure) ([place]) 06/08/2015  . Atrial fibrillation ([place]) 10/25/2014  . Chronic cholecystitis 10/11/2013  . Abdominal pain 09/12/2013  . Cholelithiasis 09/12/2013  . Diabetic neuropathy ([place]) 07/06/2013  . Diastolic dysfunction [id number]  . SSS (sick sinus syndrome) ([place]) 02/10/2013  . Hypertension 07/30/2012  . Pacemaker-Medtronic 07/19/2012  . Benign hypertensive heart disease with heart failure ([place]) 01/28/2012  . Diabetes ([place]) 10/22/2010  . HYPERCHOLESTEROLEMIA 10/22/2010  . Angina pectoris ([place]) 10/22/2010  . SICK SINUS SYNDROME 10/22/2010  . DEGENERATIVE JOINT DISEASE 10/22/2010    Current Outpatient Prescriptions on File Prior to Visit  Medication Sig Dispense Refill  . apixaban (ELIQUIS) 5 MG TABS tablet Take 5 mg by mouth 2 (two) times daily.    Marland Kitchen b complex vitamins tablet Take 1 tablet by mouth daily.    . calcium carbonate (OS-CAL) 600 MG TABS tablet Take 600 mg by mouth daily with breakfast.     . CRESTOR 10 MG tablet TAKE ONE TABLET BY MOUTH ONCE DAILY IN THE MORNING 90 tablet 3  . Cyanocobalamin (VITAMIN B12 PO) Take 1 tablet by mouth daily.     . fish oil-omega-3 fatty acids 1000 MG capsule Take 1 g by mouth every morning.     . furosemide (LASIX) 40 MG tablet Take 1 tablet (40 mg total) by mouth daily. 90 tablet 3  .  gabapentin (NEURONTIN) 300 MG capsule TAKE ONE CAPSULE BY MOUTH TWICE DAILY 60 capsule 0  . hydrALAZINE (APRESOLINE) 25 MG tablet TAKE ONE TABLET BY MOUTH EVERY 8 HOURS. 90 tablet 4  . hydrochlorothiazide (MICROZIDE) 12.5 MG capsule Take 12.5 mg by mouth daily.    Marland Kitchen lisinopril (PRINIVIL,ZESTRIL) 20 MG tablet TAKE ONE TABLET BY MOUTH TWICE DAILY 60 tablet 7  . metFORMIN (GLUCOPHAGE) 500 MG tablet TAKE ONE TABLET BY MOUTH ONCE DAILY WITH BREAKFAST 90 tablet 3  . nitroGLYCERIN (NITROSTAT) 0.4 MG SL tablet Place 1 tablet (0.4 mg total) under the tongue every 5 (five) minutes as needed for chest pain. x3 doses as needed for chest pain 25  tablet prn  . polyethylene glycol (MIRALAX / GLYCOLAX) packet Take 17 g by mouth daily as needed for moderate constipation.     No current facility-administered medications on file prior to visit.    Past Medical History  Diagnosis Date  . Hypertension   . Diabetes mellitus     NON INSULIN DEPENDENT  . Hypercholesterolemia   . DJD (degenerative joint disease)   . History of diabetic neuropathy   . CAD (coronary artery disease)     a. s/p DESx2 to mid and distal RCA in 2010.  . SSS (sick sinus syndrome) ([place])     a. s/p Medtronic PPM by JA for SSS and syncope 9/11.  . Breast cancer ([place])   . GERD (gastroesophageal reflux disease)   . Hemorrhoids   . Cholelithiasis 09/12/2013    Lap Chole on 09/14/13   . Paroxysmal atrial fibrillation ([facility])     a. discovered on PPM interrogation 12/15, CHAD2VASC score is 6. b. decompensation with dCHF in 06/2015 possibly due to AF, s/p DCCV.  . CKD (chronic kidney disease), stage III   . Chronic diastolic CHF (congestive heart failure) ([place])     a. Echo 06/09/15: EF 55-60%, normal wall motion, mild AI, mild to moderate MR, mild LAE, mildly reduced RVSF, mild RAE, moderate TR, PASP 53 mmHg  . Hypertensive heart disease     Past Surgical History  Procedure Laterality Date  . Cardiac catheterization  07/05/2009    EF 55-60%  . Insert / replace / remove pacemaker  9/11    SSS and syncope, implanted by JA (MDT)  . Mastectomy  1992    BILATERAL WITH RECONSTRUCTION  . Colonoscopy    . Ercp N/A 09/13/2013    Procedure: ENDOSCOPIC RETROGRADE CHOLANGIOPANCREATOGRAPHY (ERCP);  Surgeon: [name], MD;  Location[facility] ENDOSCOPY;  Service: Endoscopy;  Laterality: N/A;  . Cholecystectomy N/A 09/14/2013    Procedure: LAPAROSCOPIC CHOLECYSTECTOMY WITH INTRAOPERATIVE CHOLANGIOGRAM;  Surgeon: [name], MD;  Location: [facility];  Service: General;  Laterality: N/A;  . Cardioversion N/A 06/11/2015    Procedure: CARDIOVERSION;  Surgeon: [name], MD;   Location: [facility] ENDOSCOPY;  Service: Cardiovascular;  Laterality: N/A;    Social History   Social History  . Marital Status: Widowed    Spouse Name: N/A  . Number of Children: N/A  . Years of Education: N/A   Social History Main Topics  . Smoking status: Never Smoker   . Smokeless tobacco: Never Used  . Alcohol Use: No  . Drug Use: No  . Sexual Activity: Not on file   Other Topics Concern  . Not on file   Social History Narrative    Family History  Problem Relation Age of Onset  . Cancer Mother   . Cancer Father  Review of Systems  Constitutional: Negative for fever, chills and appetite change.  HENT: Positive for postnasal drip and rhinorrhea.   Respiratory: Negative for cough, shortness of breath and wheezing.   Cardiovascular: Positive for leg swelling (chronic). Negative for chest pain and palpitations.  Gastrointestinal: Positive for constipation. Negative for nausea and abdominal pain.  Genitourinary: Negative for dysuria and hematuria.  Musculoskeletal: Positive for back pain and arthralgias.  Neurological: Negative for dizziness, light-headedness and headaches.  Psychiatric/Behavioral: Positive for sleep disturbance (sometimes has difficulty going to sleep - ? anxiety). Negative for dysphoric mood. The patient is not nervous/anxious.        Objective:   Filed Vitals:   04/29/16 1504  BP: 160/88  Pulse: 83  Temp: 97.9 F (36.6 C)  Resp: 16   Filed Weights   04/29/16 1504  Weight: 163 lb (73.936 kg)   Body mass index is 28.88 kg/(m^2).   Physical Exam Constitutional: She appears well-developed and well-nourished. No distress.  HENT:  Head: Normocephalic and atraumatic.  Right Ear: External ear normal.   Left Ear: External ear normal.    Mouth/Throat: Oropharynx is clear and moist.  Eyes: Conjunctivae are normal.  Neck: Neck supple. No tracheal deviation present. No thyromegaly present.  No carotid bruit  Cardiovascular: Normal rate, regular  rhythm and normal heart sounds.   No murmur heard.  2+ pitting edema b/l LE. Pulmonary/Chest: Effort normal and breath sounds normal. No respiratory distress. She has no wheezes. She has no rales.  Abdominal: Soft. She exhibits no distension. There is no tenderness.  Lymphadenopathy: She has no cervical adenopathy.  Skin: Skin is warm and dry. She is not diaphoretic.  Psychiatric: She has a normal mood and affect. Her behavior is normal.       Assessment & Plan:   See Problem List for Assessment and Plan of chronic medical problems.

## 2016-04-29 NOTE — Assessment & Plan Note (Signed)
White coat htn - well controlled at home Continue to monitor at home Continue current medication

## 2016-04-29 NOTE — Assessment & Plan Note (Signed)
crestor 10 mg daily

## 2016-04-29 NOTE — Patient Instructions (Signed)
   Medications reviewed and updated.  Changes include increasing the gabapentin at night to 400 mg.  Your prescription(s) have been submitted to your pharmacy. Please take as directed and contact our office if you believe you are having problem(s) with the medication(s).   Please followup in 6 months

## 2016-05-15 ENCOUNTER — Encounter: Payer: Self-pay | Admitting: Cardiology

## 2016-05-15 ENCOUNTER — Ambulatory Visit (INDEPENDENT_AMBULATORY_CARE_PROVIDER_SITE_OTHER): Payer: Medicare Other | Admitting: Cardiology

## 2016-05-15 VITALS — BP 126/62 | HR 81 | Ht 63.0 in | Wt 164.0 lb

## 2016-05-15 DIAGNOSIS — I519 Heart disease, unspecified: Secondary | ICD-10-CM | POA: Diagnosis not present

## 2016-05-15 DIAGNOSIS — I5033 Acute on chronic diastolic (congestive) heart failure: Secondary | ICD-10-CM

## 2016-05-15 DIAGNOSIS — I480 Paroxysmal atrial fibrillation: Secondary | ICD-10-CM

## 2016-05-15 DIAGNOSIS — I119 Hypertensive heart disease without heart failure: Secondary | ICD-10-CM | POA: Insufficient documentation

## 2016-05-15 DIAGNOSIS — E7800 Pure hypercholesterolemia, unspecified: Secondary | ICD-10-CM

## 2016-05-15 DIAGNOSIS — I110 Hypertensive heart disease with heart failure: Secondary | ICD-10-CM

## 2016-05-15 DIAGNOSIS — I5043 Acute on chronic combined systolic (congestive) and diastolic (congestive) heart failure: Secondary | ICD-10-CM | POA: Insufficient documentation

## 2016-05-15 DIAGNOSIS — I5189 Other ill-defined heart diseases: Secondary | ICD-10-CM

## 2016-05-15 DIAGNOSIS — I5023 Acute on chronic systolic (congestive) heart failure: Secondary | ICD-10-CM | POA: Insufficient documentation

## 2016-05-15 NOTE — Patient Instructions (Signed)
Medication Instructions:   Your physician recommends that you continue on your current medications as directed. Please refer to the Current Medication list given to you today.     Follow-Up:  3 MONTHS WITH DR NELSON       If you need a refill on your cardiac medications before your next appointment, please call your pharmacy.   

## 2016-05-15 NOTE — Progress Notes (Signed)
Cardiology Office Note    Date:  05/15/2016   ID:  Jessica [name], DOB 1927/10/17, MRN [id number]  PCP:  Binnie Rail, MD  Cardiologist:   Ena Dawley, MD   Chief complain: 6 weeks follow-up, transition of care from Dr. [name] to Dr. [name].  History of Present Illness:  Jessica [name] is a 82 y.o. female who is a former patient of Dr. [name].  Jessica [name] is a 82 y.o. female who presents for hospital follow-up. She was recently admitted with congestive heart failure secondary to unrecognized recurrence of atrial fibrillation in the presence of a pacemaker. She has a complex past medical history. She has a history of sick sinus syndrome and has a permanent pacemaker implanted on 07/14/10. She has had no further episodes of syncope since the pacemaker implantation. She has a history of ischemic heart disease and underwent insertion of 2 drug-eluting stents placed in the mid and distal right coronary artery in September 2010 by Dr. Terrence Dupont. She has a history of diabetes, essential hypertension, and high cholesterol. An echocardiogram in September 2011 showed normal ejection fraction of 123456 with diastolic dysfunction.a more recent echocardiogram on 06/09/15 showed ejection fraction of 55-60% and there was mild aortic insufficiency, mild to moderate mitral regurgitation, and elevated pulmonary artery pressure of 53.  She is status post cholecystectomy by Dr. Brantley Stage. She has had an uneventful postoperative course and is not having any digestive symptoms other than for occasional bowel irregularity. She does take stool softeners. She was admitted on 06/08/15 in significant CHF. She had gained more than 10 pounds and had bilateral peripheral edema. Interrogation of her pacemaker revealed it she had gone back into atrial fibrillation in mid July. She was treated with aggressive IV diuresis. On 06/11/15 she was able to be cardioverted back to normal sinus rhythm. She had already prior  to admission been on long-term anticoagulation for her paroxysmal atrial fibrillation. Since discharge she has continued to have mild peripheral edema. It does not go down overnight. She has not been having any chest pain. Since last visit she has been doing well except for moderate pitting edema of both legs. She is sleeping in a left chair. Blood pressure has been running higher at times. She has urinary frequency and she has nocturia 4.  She was seen by [name] in May 2017 with symptoms of lower extremity edema and shortness of breath and was instructed to decrease her fluid intake. Since then she feels better, she has chronic lower extremity edema up to her lower calves however she states this is better than her baseline. She denies any orthopnea paroxysmal nocturnal dyspnea and no dizziness or syncope. No chest pain. She is stable exertional shortness of breath. She is compliant with her medicines and denies any bleeding with Eliquis.  Past Medical History  Diagnosis Date  . Hypertension   . Diabetes mellitus     NON INSULIN DEPENDENT  . Hypercholesterolemia   . DJD (degenerative joint disease)   . History of diabetic neuropathy   . CAD (coronary artery disease)     a. s/p DESx2 to mid and distal RCA in 2010.  . SSS (sick sinus syndrome) ([place])     a. s/p Medtronic PPM by JA for SSS and syncope 9/11.  . Breast cancer ([place])   . GERD (gastroesophageal reflux disease)   . Hemorrhoids   . Cholelithiasis 09/12/2013    Lap Chole on 09/14/13   . Paroxysmal atrial fibrillation (HCC)  a. discovered on PPM interrogation 12/15, CHAD2VASC score is 6. b. decompensation with dCHF in 06/2015 possibly due to AF, s/p DCCV.  . CKD (chronic kidney disease), stage III   . Chronic diastolic CHF (congestive heart failure) ([place])     a. Echo 06/09/15: EF 55-60%, normal wall motion, mild AI, mild to moderate MR, mild LAE, mildly reduced RVSF, mild RAE, moderate TR, PASP 53 mmHg  . Hypertensive heart  disease     Past Surgical History  Procedure Laterality Date  . Cardiac catheterization  07/05/2009    EF 55-60%  . Insert / replace / remove pacemaker  9/11    SSS and syncope, implanted by JA (MDT)  . Mastectomy  1992    BILATERAL WITH RECONSTRUCTION  . Colonoscopy    . Ercp N/A 09/13/2013    Procedure: ENDOSCOPIC RETROGRADE CHOLANGIOPANCREATOGRAPHY (ERCP);  Surgeon: [name], MD;  Location: [facility] ENDOSCOPY;  Service: Endoscopy;  Laterality: N/A;  . Cholecystectomy N/A 09/14/2013    Procedure: LAPAROSCOPIC CHOLECYSTECTOMY WITH INTRAOPERATIVE CHOLANGIOGRAM;  Surgeon: [name], MD;  Location: [place];  Service: General;  Laterality: N/A;  . Cardioversion N/A 06/11/2015    Procedure: CARDIOVERSION;  Surgeon: [name], MD;  Location: [facility] ENDOSCOPY;  Service: Cardiovascular;  Laterality: N/A;    Current Medications: Outpatient Prescriptions Prior to Visit  Medication Sig Dispense Refill  . apixaban (ELIQUIS) 5 MG TABS tablet Take 5 mg by mouth 2 (two) times daily.    Marland Kitchen b complex vitamins tablet Take 1 tablet by mouth daily.    . calcium carbonate (OS-CAL) 600 MG TABS tablet Take 600 mg by mouth daily with breakfast.     . Cyanocobalamin (VITAMIN B12 PO) Take 1 tablet by mouth daily.     . fish oil-omega-3 fatty acids 1000 MG capsule Take 1 g by mouth every morning.     . furosemide (LASIX) 40 MG tablet Take 1 tablet (40 mg total) by mouth daily. 90 tablet 3  . gabapentin (NEURONTIN) 100 MG capsule Take 1 capsule (100 mg total) by mouth at bedtime. Take in addition to the 300 mg cap you are already taking at night 90 capsule 3  . gabapentin (NEURONTIN) 300 MG capsule TAKE ONE CAPSULE BY MOUTH TWICE DAILY 60 capsule 0  . hydrALAZINE (APRESOLINE) 25 MG tablet TAKE ONE TABLET BY MOUTH EVERY 8 HOURS. 90 tablet 4  . hydrochlorothiazide (MICROZIDE) 12.5 MG capsule Take 1 capsule (12.5 mg total) by mouth daily. 90 capsule 3  . lisinopril (PRINIVIL,ZESTRIL) 20 MG tablet Take 1  tablet (20 mg total) by mouth 2 (two) times daily. 180 tablet 3  . metFORMIN (GLUCOPHAGE) 500 MG tablet TAKE ONE TABLET BY MOUTH ONCE DAILY WITH BREAKFAST 90 tablet 3  . nitroGLYCERIN (NITROSTAT) 0.4 MG SL tablet Place 1 tablet (0.4 mg total) under the tongue every 5 (five) minutes as needed for chest pain. x3 doses as needed for chest pain 25 tablet prn  . polyethylene glycol (MIRALAX / GLYCOLAX) packet Take 17 g by mouth daily as needed for moderate constipation.    . rosuvastatin (CRESTOR) 10 MG tablet TAKE ONE TABLET BY MOUTH ONCE DAILY IN THE MORNING 90 tablet 3   No facility-administered medications prior to visit.     Allergies:   Lyrica; Amlodipine; and Sulfonamide derivatives   Social History   Social History  . Marital Status: Widowed    Spouse Name: N/A  . Number of Children: N/A  . Years of Education: N/A  Social History Main Topics  . Smoking status: Never Smoker   . Smokeless tobacco: Never Used  . Alcohol Use: No  . Drug Use: No  . Sexual Activity: Not Asked   Other Topics Concern  . None   Social History Narrative    Family History:  The patient's family history includes Cancer in her father and mother.   ROS:   Please see the history of present illness.    ROS All other systems reviewed and are negative.   PHYSICAL EXAM:   VS:  BP 126/62 mmHg  Pulse 81  Ht 5\' 3"  (1.6 m)  Wt 164 lb (74.39 kg)  BMI 29.06 kg/m2   GEN: Well nourished, well developed, in no acute distress HEENT: normal Neck: no JVD, carotid bruits, or masses Cardiac: RRR; no murmurs, rubs, or gallops, 1+ LE edema B/L up to the low calves Respiratory:  clear to auscultation bilaterally, normal work of breathing GI: soft, nontender, nondistended, + BS MS: no deformity or atrophy Skin: warm and dry, no rash Neuro:  Alert and Oriented x 3, Strength and sensation are intact Psych: euthymic mood, full affect  Wt Readings from Last 3 Encounters:  05/15/16 164 lb (74.39 kg)  04/29/16 163  lb (73.936 kg)  04/01/16 161 lb (73.029 kg)    Studies/Labs Reviewed:   Recent Labs: 04/01/2016: ALT 11; Brain Natriuretic Peptide 304.7*; BUN 29*; Creat 1.10*; Hemoglobin 12.6; Platelets 276; Potassium 3.9; Sodium 142; TSH 2.95   Lipid Panel    Component Value Date/Time   CHOL 101 06/09/2015 0330   TRIG 52 06/09/2015 0330   HDL 38* 06/09/2015 0330   CHOLHDL 2.7 06/09/2015 0330   VLDL 10 06/09/2015 0330   LDLCALC 53 06/09/2015 0330   LDLDIRECT 152.0 10/10/2013 1619    Additional studies/ records that were reviewed today include:    ASSESSMENT:    1. AF (paroxysmal atrial fibrillation) ([place])   2. HYPERCHOLESTEROLEMIA   3. Diastolic dysfunction   4. Diastolic dysfunction with acute on chronic heart failure ([place])   5. Hypertensive heart disease with heart failure ([place])      PLAN:  In order of problems listed above:    1. Hypertensive heart disease without heart failure 2. sick sinus syndrome with functioning pacemaker 3. type 2 diabetes with peripheral neuropathy 4. coronary artery disease with drug-eluting stents placed in September 2010 by Dr. Terrence Dupont 5. status post cholecystectomy 6. Hypercholesterolemia. 7. Paroxysmal atrial fibrillation with chads vascular score of 6. On Eliquis. Required recent cardioversion for paroxysmal atrial fibrillation on 06/11/2015. 8. Recent congestive heart failure secondary to paroxysmal atrial fibrillation. 9. Peripheral edema.  The patient has lower extremity edema and minimal crackles at her left lower lung base, however states that this is better than usual. Her weight is 8 pounds about patient is asymptomatic. For now I would continue the same management and she is instructed to take extra Lasix 40 mg in the afternoon on days when she feels more shortness of breath or her lower extremity edema is worse.  Medication Adjustments/Labs and Tests Ordered: Current medicines are reviewed at length with the patient today.  Concerns  regarding medicines are outlined above.  Medication changes, Labs and Tests ordered today are listed in the Patient Instructions below. Patient Instructions  Medication Instructions:   Your physician recommends that you continue on your current medications as directed. Please refer to the Current Medication list given to you today.    Follow-Up:  3 MONTHS WITH DR [name]  If you need a refill on your cardiac medications before your next appointment, please call your pharmacy.       Signed, Ena Dawley, MD  05/15/2016 9:29 AM    [address], [place], [place]  [address] Phone: [phone]; Fax: [phone]

## 2016-05-20 ENCOUNTER — Encounter: Payer: Self-pay | Admitting: Podiatry

## 2016-05-20 ENCOUNTER — Ambulatory Visit (INDEPENDENT_AMBULATORY_CARE_PROVIDER_SITE_OTHER): Payer: Medicare Other | Admitting: Podiatry

## 2016-05-20 DIAGNOSIS — M79676 Pain in unspecified toe(s): Secondary | ICD-10-CM

## 2016-05-20 DIAGNOSIS — B351 Tinea unguium: Secondary | ICD-10-CM | POA: Diagnosis not present

## 2016-05-20 DIAGNOSIS — E1140 Type 2 diabetes mellitus with diabetic neuropathy, unspecified: Secondary | ICD-10-CM

## 2016-05-20 NOTE — Progress Notes (Signed)
Subjective:     Patient ID: Jessica [name], female   DOB: 04-13-1927, 82 y.o.   MRN: [id number]  HPIThis patient returns to the office for preventive foot care services.  She says her nails continue to be painful  She presents to the office today talking of swollen feet right foot due to fractured ankle right.. Patient has diabetic neuropathy.  Her callus has improved left foot    Review of Systems     Objective:   Physical Exam GENERAL APPEARANCE: Alert, conversant. Appropriately groomed. No acute distress.  VASCULAR: Pedal pulses palpable at  [facility] and PT bilateral.  Capillary refill time is immediate to all digits,  Normal temperature gradient.  Digital hair growth is present bilateral  NEUROLOGIC: sensation is normal to 5.07 monofilament at 5/5 sites bilateral.  Light touch is intact bilateral, Muscle strength normal.  MUSCULOSKELETAL: acceptable muscle strength, tone and stability bilateral.  Intrinsic muscluature intact bilateral.  Rectus appearance of foot and digits noted bilateral. Plantar fibroma sub 3 left.  DERMATOLOGIC: skin color, texture, and turgor are within normal limits.  No preulcerative lesions or ulcers  are seen, no interdigital maceration noted.  No open lesions present.   NAILS  Thick disfigured discolored nails both feet.      Assessment:    Onychomycosis     Plan:    Debride onychomycosis   RTC 3 months.    Gardiner Barefoot DPM

## 2016-05-26 ENCOUNTER — Other Ambulatory Visit: Payer: Self-pay | Admitting: Cardiology

## 2016-05-27 MED ORDER — GABAPENTIN 300 MG PO CAPS: 300.0000 mg | ORAL_CAPSULE | Freq: Two times a day (BID) | ORAL | 1 refills | Status: DC

## 2016-05-27 NOTE — Telephone Encounter (Signed)
Ok to fill with refills.  

## 2016-05-27 NOTE — Telephone Encounter (Signed)
Please defer this refill of Gabapentin to the pts PCP Billey Gosling MD, for she last filled this medication on 04/29/16.  Thanks!

## 2016-05-27 NOTE — Addendum Note (Signed)
Addended by: Terence Lux B on: 05/27/2016 01:57 PM   Modules accepted: Orders

## 2016-06-23 ENCOUNTER — Other Ambulatory Visit: Payer: Self-pay | Admitting: Cardiology

## 2016-07-09 ENCOUNTER — Other Ambulatory Visit: Payer: Self-pay | Admitting: Cardiology

## 2016-07-17 LAB — HM DIABETES EYE EXAM

## 2016-07-22 ENCOUNTER — Other Ambulatory Visit: Payer: Self-pay | Admitting: Internal Medicine

## 2016-07-22 NOTE — Telephone Encounter (Signed)
Please advise, last fill 05/27/16, #[phone] with 1 refill

## 2016-07-23 ENCOUNTER — Ambulatory Visit (INDEPENDENT_AMBULATORY_CARE_PROVIDER_SITE_OTHER): Payer: Medicare Other | Admitting: *Deleted

## 2016-07-23 DIAGNOSIS — I495 Sick sinus syndrome: Secondary | ICD-10-CM

## 2016-07-23 NOTE — Progress Notes (Signed)
Remote pacemaker transmission.   

## 2016-07-25 ENCOUNTER — Encounter: Payer: Self-pay | Admitting: Cardiology

## 2016-07-30 ENCOUNTER — Encounter: Payer: Self-pay | Admitting: Internal Medicine

## 2016-08-13 LAB — CUP PACEART REMOTE DEVICE CHECK
Battery Impedance: 367 Ohm
Battery Voltage: 2.79 V
Brady Statistic AP VP Percent: 7 %
Brady Statistic AP VS Percent: 92 %
Implantable Lead Implant Date: 20110912
Implantable Lead Implant Date: 20110912
Implantable Lead Location: 753859
Implantable Lead Model: 5076
Implantable Lead Model: 5092
Lead Channel Impedance Value: 591 Ohm
Lead Channel Impedance Value: 774 Ohm
Lead Channel Pacing Threshold Amplitude: 0.75 V
Lead Channel Pacing Threshold Pulse Width: 0.4 ms
Lead Channel Setting Pacing Amplitude: 2 V
Lead Channel Setting Pacing Amplitude: 2.5 V
MDC IDC LEAD LOCATION: 753860
MDC IDC MSMT BATTERY REMAINING LONGEVITY: 103 mo
MDC IDC MSMT LEADCHNL RV PACING THRESHOLD AMPLITUDE: 0.625 V
MDC IDC MSMT LEADCHNL RV PACING THRESHOLD PULSEWIDTH: 0.4 ms
MDC IDC MSMT LEADCHNL RV SENSING INTR AMPL: 5.6 mV
MDC IDC SESS DTM: 20170920132631
MDC IDC SET LEADCHNL RV PACING PULSEWIDTH: 0.4 ms
MDC IDC SET LEADCHNL RV SENSING SENSITIVITY: 2 mV
MDC IDC STAT BRADY AS VP PERCENT: 0 %
MDC IDC STAT BRADY AS VS PERCENT: 0 %

## 2016-08-19 ENCOUNTER — Ambulatory Visit (INDEPENDENT_AMBULATORY_CARE_PROVIDER_SITE_OTHER): Payer: Medicare Other | Admitting: Podiatry

## 2016-08-19 ENCOUNTER — Encounter: Payer: Self-pay | Admitting: Podiatry

## 2016-08-19 VITALS — BP 172/90 | HR 87 | Resp 14

## 2016-08-19 DIAGNOSIS — E1140 Type 2 diabetes mellitus with diabetic neuropathy, unspecified: Secondary | ICD-10-CM

## 2016-08-19 DIAGNOSIS — M79676 Pain in unspecified toe(s): Secondary | ICD-10-CM | POA: Diagnosis not present

## 2016-08-19 DIAGNOSIS — B351 Tinea unguium: Secondary | ICD-10-CM

## 2016-08-19 NOTE — Progress Notes (Signed)
Subjective:     Patient ID: Jessica [name], female   DOB: 1927/10/11, 82 y.o.   MRN: [id number]  HPIThis patient returns to the office for preventive foot care services.  She says her nails continue to be painful  . Patient has diabetic neuropathy.  Her callus has improved left foot    Review of Systems     Objective:   Physical Exam GENERAL APPEARANCE: Alert, conversant. Appropriately groomed. No acute distress.  VASCULAR: Pedal pulses palpable at  [facility] and PT bilateral.  Capillary refill time is immediate to all digits,  Normal temperature gradient.  Digital hair growth is present bilateral  NEUROLOGIC: sensation is normal to 5.07 monofilament at 5/5 sites bilateral.  Light touch is intact bilateral, Muscle strength normal.  MUSCULOSKELETAL: acceptable muscle strength, tone and stability bilateral.  Intrinsic muscluature intact bilateral.  Rectus appearance of foot and digits noted bilateral. Plantar fibroma sub 3 left.  DERMATOLOGIC: skin color, texture, and turgor are within normal limits.  No preulcerative lesions or ulcers  are seen, no interdigital maceration noted.  No open lesions present.   NAILS  Thick disfigured discolored nails both feet.      Assessment:    Onychomycosis     Plan:    Debride onychomycosis   RTC 3 months.    Gardiner Barefoot DPM

## 2016-08-21 ENCOUNTER — Encounter: Payer: Self-pay | Admitting: Cardiology

## 2016-09-03 ENCOUNTER — Ambulatory Visit (INDEPENDENT_AMBULATORY_CARE_PROVIDER_SITE_OTHER): Payer: Medicare Other | Admitting: Internal Medicine

## 2016-09-03 ENCOUNTER — Ambulatory Visit (INDEPENDENT_AMBULATORY_CARE_PROVIDER_SITE_OTHER): Payer: Medicare Other | Admitting: Cardiology

## 2016-09-03 ENCOUNTER — Encounter: Payer: Self-pay | Admitting: Cardiology

## 2016-09-03 ENCOUNTER — Encounter: Payer: Self-pay | Admitting: Internal Medicine

## 2016-09-03 VITALS — BP 124/70 | HR 70 | Ht 63.0 in | Wt 170.0 lb

## 2016-09-03 VITALS — BP 124/72 | HR 70 | Ht 63.0 in | Wt 170.0 lb

## 2016-09-03 DIAGNOSIS — I110 Hypertensive heart disease with heart failure: Secondary | ICD-10-CM

## 2016-09-03 DIAGNOSIS — Z950 Presence of cardiac pacemaker: Secondary | ICD-10-CM

## 2016-09-03 DIAGNOSIS — Z23 Encounter for immunization: Secondary | ICD-10-CM | POA: Diagnosis not present

## 2016-09-03 DIAGNOSIS — I495 Sick sinus syndrome: Secondary | ICD-10-CM

## 2016-09-03 DIAGNOSIS — I480 Paroxysmal atrial fibrillation: Secondary | ICD-10-CM | POA: Diagnosis not present

## 2016-09-03 DIAGNOSIS — I5033 Acute on chronic diastolic (congestive) heart failure: Secondary | ICD-10-CM

## 2016-09-03 DIAGNOSIS — I10 Essential (primary) hypertension: Secondary | ICD-10-CM

## 2016-09-03 LAB — CUP PACEART INCLINIC DEVICE CHECK
Battery Remaining Longevity: 100 mo
Battery Voltage: 2.79 V
Brady Statistic AP VP Percent: 9 %
Brady Statistic AS VP Percent: 0 %
Brady Statistic AS VS Percent: 0 %
Date Time Interrogation Session: 20171101140729
Implantable Lead Implant Date: 20110912
Implantable Lead Location: 753859
Implantable Lead Location: 753860
Implantable Pulse Generator Implant Date: 20110912
Lead Channel Impedance Value: 707 Ohm
Lead Channel Pacing Threshold Amplitude: 0.625 V
Lead Channel Pacing Threshold Pulse Width: 0.4 ms
Lead Channel Sensing Intrinsic Amplitude: 2 mV
Lead Channel Setting Pacing Amplitude: 2.5 V
Lead Channel Setting Pacing Pulse Width: 0.4 ms
Lead Channel Setting Sensing Sensitivity: 2 mV
MDC IDC LEAD IMPLANT DT: 20110912
MDC IDC MSMT BATTERY IMPEDANCE: 390 Ohm
MDC IDC MSMT LEADCHNL RA IMPEDANCE VALUE: 547 Ohm
MDC IDC MSMT LEADCHNL RA PACING THRESHOLD AMPLITUDE: 0.5 V
MDC IDC MSMT LEADCHNL RA PACING THRESHOLD AMPLITUDE: 0.75 V
MDC IDC MSMT LEADCHNL RA PACING THRESHOLD PULSEWIDTH: 0.4 ms
MDC IDC MSMT LEADCHNL RA PACING THRESHOLD PULSEWIDTH: 0.4 ms
MDC IDC MSMT LEADCHNL RV PACING THRESHOLD AMPLITUDE: 0.75 V
MDC IDC MSMT LEADCHNL RV PACING THRESHOLD PULSEWIDTH: 0.4 ms
MDC IDC SET LEADCHNL RA PACING AMPLITUDE: 2 V
MDC IDC STAT BRADY AP VS PERCENT: 91 %

## 2016-09-03 MED ORDER — FUROSEMIDE 40 MG PO TABS: 40.0000 mg | ORAL_TABLET | Freq: Two times a day (BID) | ORAL | 1 refills | Status: DC

## 2016-09-03 NOTE — Progress Notes (Signed)
Cardiology Office Note    Date:  09/04/2016   ID:  Jessica [name], DOB 08/15/1927, MRN [id number]  PCP:  Binnie Rail, MD  Cardiologist:   Ena Dawley, MD   Chief complain: 6 weeks follow-up, transition of care from Dr. [name] to Dr. [name].  History of Present Illness:  Jessica [name] is a 82 y.o. female who is a former patient of Dr. [name]. She was recently admitted with congestive heart failure secondary to unrecognized recurrence of atrial fibrillation in the presence of a pacemaker. She has a complex past medical history. She has a history of sick sinus syndrome and has a permanent pacemaker implanted on 07/14/10. She has had no further episodes of syncope since the pacemaker implantation. She has a history of ischemic heart disease and underwent insertion of 2 drug-eluting stents placed in the mid and distal right coronary artery in September 2010 by Dr. Terrence Dupont. She has a history of diabetes, essential hypertension, and high cholesterol. An echocardiogram in September 2011 showed normal ejection fraction of [id number] with diastolic dysfunction.a more recent echocardiogram on 06/09/15 showed ejection fraction of 55-60% and there was mild aortic insufficiency, mild to moderate mitral regurgitation, and elevated pulmonary artery pressure of 53.  She is status post cholecystectomy by Dr. Brantley Stage. She has had an uneventful postoperative course and is not having any digestive symptoms other than for occasional bowel irregularity. She does take stool softeners. She was admitted on 06/08/15 in significant CHF. She had gained more than 10 pounds and had bilateral peripheral edema. Interrogation of her pacemaker revealed it she had gone back into atrial fibrillation in mid July. She was treated with aggressive IV diuresis. On 06/11/15 she was able to be cardioverted back to normal sinus rhythm. She had already prior to admission been on long-term anticoagulation for her paroxysmal atrial  fibrillation. Since discharge she has continued to have mild peripheral edema. It does not go down overnight. She has not been having any chest pain. Since last visit she has been doing well except for moderate pitting edema of both legs. She is sleeping in a left chair. Blood pressure has been running higher at times. She has urinary frequency and she has nocturia 4.  She was seen by [name] in May 2017 with symptoms of lower extremity edema and shortness of breath and was instructed to decrease her fluid intake. Since then she feels better, she has chronic lower extremity edema up to her lower calves however she states this is better than her baseline. She denies any orthopnea paroxysmal nocturnal dyspnea and no dizziness or syncope. No chest pain. She is stable exertional shortness of breath. She is compliant with her medicines and denies any bleeding with Eliquis.  09/03/2016 - today she states that she feels well, however she has noticed difficulties to sleep, increased abdominal girth and LE edema, that has been chronic. She has been taking lasix 40 mg po daily. Walks with a walker. No chest pain.No bleeding with Eliquis.  Past Medical History:  Diagnosis Date  . Breast cancer ([place])   . CAD (coronary artery disease)    a. s/p DESx2 to mid and distal RCA in 2010.  Marland Kitchen Cholelithiasis 09/12/2013   Lap Chole on 09/14/13   . Chronic diastolic CHF (congestive heart failure) ([place])    a. Echo 06/09/15: EF 55-60%, normal wall motion, mild AI, mild to moderate MR, mild LAE, mildly reduced RVSF, mild RAE, moderate TR, PASP 53 mmHg  . CKD (chronic  kidney disease), stage III   . Diabetes mellitus    NON INSULIN DEPENDENT  . DJD (degenerative joint disease)   . GERD (gastroesophageal reflux disease)   . Hemorrhoids   . History of diabetic neuropathy   . Hypercholesterolemia   . Hypertension   . Hypertensive heart disease   . Paroxysmal atrial fibrillation (HCC)    a. discovered on PPM  interrogation 12/15, CHAD2VASC score is 6. b. decompensation with dCHF in 06/2015 possibly due to AF, s/p DCCV.  . SSS (sick sinus syndrome) ([place])    a. s/p Medtronic PPM by JA for SSS and syncope 9/11.    Past Surgical History:  Procedure Laterality Date  . CARDIAC CATHETERIZATION  07/05/2009   EF 55-60%  . CARDIOVERSION N/A 06/11/2015   Procedure: CARDIOVERSION;  Surgeon: [name], MD;  Location: [facility] ENDOSCOPY;  Service: Cardiovascular;  Laterality: N/A;  . CHOLECYSTECTOMY N/A 09/14/2013   Procedure: LAPAROSCOPIC CHOLECYSTECTOMY WITH INTRAOPERATIVE CHOLANGIOGRAM;  Surgeon: [name], MD;  Location: [facility];  Service: General;  Laterality: N/A;  . COLONOSCOPY    . ERCP N/A 09/13/2013   Procedure: ENDOSCOPIC RETROGRADE CHOLANGIOPANCREATOGRAPHY (ERCP);  Surgeon: [name], MD;  Location[facility] ENDOSCOPY;  Service: Endoscopy;  Laterality: N/A;  . INSERT / REPLACE / REMOVE PACEMAKER  9/11   SSS and syncope, implanted by JA (MDT)  . MASTECTOMY  1992   BILATERAL WITH RECONSTRUCTION    Current Medications: Outpatient Medications Prior to Visit  Medication Sig Dispense Refill  . apixaban (ELIQUIS) 5 MG TABS tablet Take 5 mg by mouth 2 (two) times daily.    Marland Kitchen b complex vitamins tablet Take 1 tablet by mouth daily.    . calcium carbonate (OS-CAL) 600 MG TABS tablet Take 600 mg by mouth daily with breakfast.     . carvedilol (COREG) 25 MG tablet TAKE ONE TABLET BY MOUTH TWICE DAILY. 60 tablet 2  . Cyanocobalamin (VITAMIN B12 PO) Take 1 tablet by mouth daily.     . fish oil-omega-3 fatty acids 1000 MG capsule Take 1 g by mouth every morning.     . gabapentin (NEURONTIN) 100 MG capsule Take 1 capsule (100 mg total) by mouth at bedtime. Take in addition to the 300 mg cap you are already taking at night 90 capsule 3  . gabapentin (NEURONTIN) 300 MG capsule TAKE ONE CAPSULE BY MOUTH TWICE DAILY 60 capsule 5  . hydrALAZINE (APRESOLINE) 25 MG tablet TAKE ONE TABLET BY MOUTH EVERY 8 HOURS  270 tablet 2  . lisinopril (PRINIVIL,ZESTRIL) 20 MG tablet Take 1 tablet (20 mg total) by mouth 2 (two) times daily. 180 tablet 3  . metFORMIN (GLUCOPHAGE) 500 MG tablet TAKE ONE TABLET BY MOUTH ONCE DAILY WITH BREAKFAST 90 tablet 3  . nitroGLYCERIN (NITROSTAT) 0.4 MG SL tablet Place 1 tablet (0.4 mg total) under the tongue every 5 (five) minutes as needed for chest pain. x3 doses as needed for chest pain 25 tablet prn  . polyethylene glycol (MIRALAX / GLYCOLAX) packet Take 17 g by mouth daily as needed for moderate constipation.    . rosuvastatin (CRESTOR) 10 MG tablet TAKE ONE TABLET BY MOUTH ONCE DAILY IN THE MORNING 90 tablet 3  . furosemide (LASIX) 40 MG tablet Take 1 tablet (40 mg total) by mouth daily. 90 tablet 3  . hydrochlorothiazide (MICROZIDE) 12.5 MG capsule Take 1 capsule (12.5 mg total) by mouth daily. 90 capsule 3   No facility-administered medications prior to visit.  Allergies:   Lyrica [pregabalin]; Amlodipine; and Sulfonamide derivatives   Social History   Social History  . Marital status: Widowed    Spouse name: N/A  . Number of children: N/A  . Years of education: N/A   Social History Main Topics  . Smoking status: Never Smoker  . Smokeless tobacco: Never Used  . Alcohol use No  . Drug use: No  . Sexual activity: Not Asked   Other Topics Concern  . None   Social History Narrative  . None    Family History:  The patient's family history includes Cancer in her father and mother.   ROS:   Please see the history of present illness.    ROS All other systems reviewed and are negative.   PHYSICAL EXAM:   VS:  BP 124/72   Pulse 70   Ht 5\' 3"  (1.6 m)   Wt 170 lb (77.1 kg)   BMI 30.11 kg/m    GEN: Well nourished, well developed, in no acute distress  HEENT: normal  Neck: no JVD, carotid bruits, or masses Cardiac: RRR; no murmurs, rubs, or gallops, 1+ LE edema B/L up to the low calves Respiratory:  Crackles at bases bilaterally, normal work of  breathing GI: soft, nontender, distended, + BS MS: no deformity or atrophy  Skin: warm and dry, no rash Neuro:  Alert and Oriented x 3, Strength and sensation are intact Psych: euthymic mood, full affect  Wt Readings from Last 3 Encounters:  09/03/16 170 lb (77.1 kg)  09/03/16 170 lb (77.1 kg)  05/15/16 164 lb (74.4 kg)    Studies/Labs Reviewed:   Recent Labs: 04/01/2016: ALT 11; Brain Natriuretic Peptide 304.7; BUN 29; Creat 1.10; Hemoglobin 12.6; Platelets 276; Potassium 3.9; Sodium 142; TSH 2.95   Lipid Panel    Component Value Date/Time   CHOL 101 06/09/2015 0330   TRIG 52 06/09/2015 0330   HDL 38 (L) 06/09/2015 0330   CHOLHDL 2.7 06/09/2015 0330   VLDL 10 06/09/2015 0330   LDLCALC 53 06/09/2015 0330   LDLDIRECT 152.0 10/10/2013 1619    Additional studies/ records that were reviewed today include:    ASSESSMENT:    1. AF (paroxysmal atrial fibrillation) ([place])   2. Encounter for immunization      PLAN:  In order of problems listed above:  1.Hypertensive heart disease without heart failure 2. sick sinus syndrome with functioning pacemaker 3. type 2 diabetes with peripheral neuropathy 4. coronary artery disease with drug-eluting stents placed in September 2010 by Dr. Terrence Dupont 5. status post cholecystectomy 6. Hypercholesterolemia. 7. Paroxysmal atrial fibrillation with chads vascular score of 6. On Eliquis. Required recent cardioversion for paroxysmal atrial fibrillation on 06/11/2015. 8. Recent congestive heart failure secondary to paroxysmal atrial fibrillation. 9. Peripheral edema.  The patient has lower extremity edema, worsening crackles and incrased abdominal girth on physical exam, also her weight has increased by 6 lbs in the last few months. We will increase lasix to 40 mg po BID and d/c hydrochlorothiazide. Follow up in 2 weeks with BMP at that time.   Medication Adjustments/Labs and Tests Ordered: Current medicines are reviewed at length with the  patient today.  Concerns regarding medicines are outlined above.  Medication changes, Labs and Tests ordered today are listed in the Patient Instructions below. Patient Instructions  Medication Instructions:  Increase Furosemide to 40 mg twice daily and stop taking Hydrochlorothiazide. All other medications remain the same.   Labwork: None  Testing/Procedures: None  Follow-Up: In 2 weeks  If you need a refill on your cardiac medications before your next appointment, please call your pharmacy.      Signed, Ena Dawley, MD  09/04/2016 6:53 AM    [address], [place], [place]  [address] Phone: [phone]; Fax: [phone]

## 2016-09-03 NOTE — Progress Notes (Signed)
Primary Cardiologist:  Dr [name] (previously Dr [name])  The patient presents today for routine electrophysiology followup.  Since last being seen in our clinic, the patient reports doing very well.   Today, she denies symptoms of palpitations, chest pain, shortness of breath, orthopnea, PND, lower extremity edema, dizziness, presyncope, syncope, or neurologic sequela.  The patient feels that she is tolerating medications without difficulties and is otherwise without complaint today.   Past Medical History:  Diagnosis Date  . Breast cancer ([place])   . CAD (coronary artery disease)    a. s/p DESx2 to mid and distal RCA in 2010.  Marland Kitchen Cholelithiasis 09/12/2013   Lap Chole on 09/14/13   . Chronic diastolic CHF (congestive heart failure) ([place])    a. Echo 06/09/15: EF 55-60%, normal wall motion, mild AI, mild to moderate MR, mild LAE, mildly reduced RVSF, mild RAE, moderate TR, PASP 53 mmHg  . CKD (chronic kidney disease), stage III   . Diabetes mellitus    NON INSULIN DEPENDENT  . DJD (degenerative joint disease)   . GERD (gastroesophageal reflux disease)   . Hemorrhoids   . History of diabetic neuropathy   . Hypercholesterolemia   . Hypertension   . Hypertensive heart disease   . Paroxysmal atrial fibrillation (HCC)    a. discovered on PPM interrogation 12/15, CHAD2VASC score is 6. b. decompensation with dCHF in 06/2015 possibly due to AF, s/p DCCV.  . SSS (sick sinus syndrome) ([place])    a. s/p Medtronic PPM by JA for SSS and syncope 9/11.   Past Surgical History:  Procedure Laterality Date  . CARDIAC CATHETERIZATION  07/05/2009   EF 55-60%  . CARDIOVERSION N/A 06/11/2015   Procedure: CARDIOVERSION;  Surgeon: [name], MD;  Location: [facility] ENDOSCOPY;  Service: Cardiovascular;  Laterality: N/A;  . CHOLECYSTECTOMY N/A 09/14/2013   Procedure: LAPAROSCOPIC CHOLECYSTECTOMY WITH INTRAOPERATIVE CHOLANGIOGRAM;  Surgeon: [name], MD;  Location: [facility];  Service: General;  Laterality:  N/A;  . COLONOSCOPY    . ERCP N/A 09/13/2013   Procedure: ENDOSCOPIC RETROGRADE CHOLANGIOPANCREATOGRAPHY (ERCP);  Surgeon: [name], MD;  Location[facility] ENDOSCOPY;  Service: Endoscopy;  Laterality: N/A;  . INSERT / REPLACE / REMOVE PACEMAKER  9/11   SSS and syncope, implanted by JA (MDT)  . MASTECTOMY  1992   BILATERAL WITH RECONSTRUCTION    Current Outpatient Prescriptions  Medication Sig Dispense Refill  . apixaban (ELIQUIS) 5 MG TABS tablet Take 5 mg by mouth 2 (two) times daily.    Marland Kitchen b complex vitamins tablet Take 1 tablet by mouth daily.    . calcium carbonate (OS-CAL) 600 MG TABS tablet Take 600 mg by mouth daily with breakfast.     . carvedilol (COREG) 25 MG tablet TAKE ONE TABLET BY MOUTH TWICE DAILY. 60 tablet 2  . Cyanocobalamin (VITAMIN B12 PO) Take 1 tablet by mouth daily.     . fish oil-omega-3 fatty acids 1000 MG capsule Take 1 g by mouth every morning.     . furosemide (LASIX) 40 MG tablet Take 1 tablet (40 mg total) by mouth 2 (two) times daily. 180 tablet 1  . gabapentin (NEURONTIN) 100 MG capsule Take 1 capsule (100 mg total) by mouth at bedtime. Take in addition to the 300 mg cap you are already taking at night 90 capsule 3  . gabapentin (NEURONTIN) 300 MG capsule TAKE ONE CAPSULE BY MOUTH TWICE DAILY 60 capsule 5  . hydrALAZINE (APRESOLINE) 25 MG tablet TAKE ONE TABLET BY  MOUTH EVERY 8 HOURS 270 tablet 2  . lisinopril (PRINIVIL,ZESTRIL) 20 MG tablet Take 1 tablet (20 mg total) by mouth 2 (two) times daily. 180 tablet 3  . metFORMIN (GLUCOPHAGE) 500 MG tablet TAKE ONE TABLET BY MOUTH ONCE DAILY WITH BREAKFAST 90 tablet 3  . nitroGLYCERIN (NITROSTAT) 0.4 MG SL tablet Place 1 tablet (0.4 mg total) under the tongue every 5 (five) minutes as needed for chest pain. x3 doses as needed for chest pain 25 tablet prn  . polyethylene glycol (MIRALAX / GLYCOLAX) packet Take 17 g by mouth daily as needed for moderate constipation.    . rosuvastatin (CRESTOR) 10 MG tablet TAKE ONE  TABLET BY MOUTH ONCE DAILY IN THE MORNING 90 tablet 3   No current facility-administered medications for this visit.     Allergies  Allergen Reactions  . Lyrica [Pregabalin] Swelling    wgt gain  . Amlodipine Swelling  . Sulfonamide Derivatives Rash    Social History   Social History  . Marital status: Widowed    Spouse name: N/A  . Number of children: N/A  . Years of education: N/A   Occupational History  . Not on file.   Social History Main Topics  . Smoking status: Never Smoker  . Smokeless tobacco: Never Used  . Alcohol use No  . Drug use: No  . Sexual activity: Not on file   Other Topics Concern  . Not on file   Social History Narrative  . No narrative on file    Family History  Problem Relation Age of Onset  . Cancer Mother   . Cancer Father    ROS- sinus pressure, SOB, all other systems are reviewed and negative except as per HPI  Physical Exam: Vitals:   09/03/16 1227  BP: 124/70  Pulse: 70  Weight: 170 lb (77.1 kg)  Height: 5\' 3"  (1.6 m)    GEN- The patient is well appearing, alert and oriented x 3 today.   Head- normocephalic, atraumatic Eyes-  Sclera clear, conjunctiva pink Ears- hearing intact Oropharynx- clear Neck- supple, no JVP Lymph- no cervical lymphadenopathy Lungs- few basilar rales , normal work of breathing Chest- pacemaker pocket is well healed Heart- Regular rate and rhythm, no murmurs, rubs or gallops, PMI not laterally displaced GI- soft, NT, ND, + BS Extremities- no clubbing, cyanosis, or edema  Pacemaker interrogation- reviewed in detail today,  See PACEART report ekg today reveals A paced rhythm  Assessment and Plan:  SICK SINUS SYNDROME  Normal pacemaker function  See Pace Art report  No changes today   HYPERTENSION  Stable No change required today  Paroxysmal atrial fibrillation Well controlled 9.7 % stroke rate/year from a score of 6  On eliquis  carelink Return to see EP NP in 1 year  Thompson Grayer MD, [facility] 09/03/2016 2:21 PM

## 2016-09-03 NOTE — Patient Instructions (Addendum)
Medication Instructions:  Your physician recommends that you continue on your current medications as directed. Please refer to the Current Medication list given to you today.   Labwork: None ordered   Testing/Procedures: None ordered   Follow-Up: Your physician wants you to follow-up in: 12 months with Dr [name] Dennis Bast will receive a reminder letter in the mail two months in advance. If you don't receive a letter, please call our office to schedule the follow-up appointment.  Remote monitoring is used to monitor your Pacemaker from home. This monitoring reduces the number of office visits required to check your device to one time per year. It allows [place] to keep an eye on the functioning of your device to ensure it is working properly. You are scheduled for a device check from home on 12/03/16. You may send your transmission at any time that day. If you have a wireless device, the transmission will be sent automatically. After your physician reviews your transmission, you will receive a postcard with your next transmission date.      Any Other Special Instructions Will Be Listed Below (If Applicable).     If you need a refill on your cardiac medications before your next appointment, please call your pharmacy.

## 2016-09-03 NOTE — Patient Instructions (Signed)
Medication Instructions:  Increase Furosemide to 40 mg twice daily and stop taking Hydrochlorothiazide. All other medications remain the same.   Labwork: None  Testing/Procedures: None  Follow-Up: In 2 weeks     If you need a refill on your cardiac medications before your next appointment, please call your pharmacy.

## 2016-09-05 ENCOUNTER — Telehealth: Payer: Self-pay | Admitting: *Deleted

## 2016-09-05 NOTE — Telephone Encounter (Signed)
Left msg on triage stating [facility] has faxed MD order for testing supplies. Requesting order to be completed mom has been out of supplies for about month...Johny Chess

## 2016-09-06 NOTE — Telephone Encounter (Signed)
Jessica [name] have we seen this? I did not see it this weekend.

## 2016-09-08 NOTE — Telephone Encounter (Signed)
Orders have been faxed over. Spoke with daughter to inform.

## 2016-09-18 ENCOUNTER — Encounter: Payer: Self-pay | Admitting: Physician Assistant

## 2016-09-18 ENCOUNTER — Ambulatory Visit (INDEPENDENT_AMBULATORY_CARE_PROVIDER_SITE_OTHER): Payer: Medicare Other | Admitting: Physician Assistant

## 2016-09-18 VITALS — BP 186/94 | HR 87 | Ht 63.0 in | Wt 169.4 lb

## 2016-09-18 DIAGNOSIS — I2510 Atherosclerotic heart disease of native coronary artery without angina pectoris: Secondary | ICD-10-CM

## 2016-09-18 DIAGNOSIS — N183 Chronic kidney disease, stage 3 unspecified: Secondary | ICD-10-CM

## 2016-09-18 DIAGNOSIS — I480 Paroxysmal atrial fibrillation: Secondary | ICD-10-CM | POA: Diagnosis not present

## 2016-09-18 DIAGNOSIS — I110 Hypertensive heart disease with heart failure: Secondary | ICD-10-CM | POA: Diagnosis not present

## 2016-09-18 DIAGNOSIS — I5032 Chronic diastolic (congestive) heart failure: Secondary | ICD-10-CM

## 2016-09-18 LAB — CBC
HCT: 35.1 % (ref 35.0–45.0)
HEMOGLOBIN: 11.8 g/dL (ref 11.7–15.5)
MCH: 30.5 pg (ref 27.0–33.0)
MCHC: 33.6 g/dL (ref 32.0–36.0)
MCV: 90.7 fL (ref 80.0–100.0)
MPV: 10.2 fL (ref 7.5–12.5)
PLATELETS: 296 10*3/uL (ref 140–400)
RBC: 3.87 MIL/uL (ref 3.80–5.10)
RDW: 14.7 % (ref 11.0–15.0)
WBC: 8.6 10*3/uL (ref 3.8–10.8)

## 2016-09-18 LAB — BASIC METABOLIC PANEL
BUN: 34 mg/dL — ABNORMAL HIGH (ref 7–25)
CO2: 29 mmol/L (ref 20–31)
Calcium: 10.4 mg/dL (ref 8.6–10.4)
Chloride: 103 mmol/L (ref 98–110)
Creat: 1.42 mg/dL — ABNORMAL HIGH (ref 0.60–0.88)
Glucose, Bld: 98 mg/dL (ref 65–99)
Potassium: 4 mmol/L (ref 3.5–5.3)
Sodium: 142 mmol/L (ref 135–146)

## 2016-09-18 NOTE — Progress Notes (Signed)
Cardiology Office Note    Date:  09/18/2016  ID:  Jessica [name], DOB 1927-09-25, MRN [id number] PCP:  Binnie Rail, MD  Cardiologist: [name]   Chief Complaint: f/u CHF  History of Present Illness:  Jessica [name] is a 82 y.o. female with history of CAD (DESx2 to mid and distal RCA in 2010), chronic diastolic CHF, hypertensive heart disease, DM with peripheral neuropathy, HTN, HLD, SSS s/p Medtronic pacemaker 2011,, PAF (discovered on device interrogation 10/2014), CKD stage III, moderate pulm HTN (by last echo) who presents for evaluation of edema. She was last hospitalized for acute diastolic CHF in 123XX123 in the setting of accelerated HTN and PAF. She underwent DCCV at that time. Echo 06/2015: EF 55-60%, no RWMA, mild AI, mild-mod MR, mild LAE/RAE, mildly reduced RV function, mod TR, PASP 65mmHg. She was seen by Dr. [name] 09/03/16 at which time she had worsening LEE, crackles, increased abdominal girth and weight gain. Lasix was increased to 40mg  BID and HCTZ was stopped. Pacer interrogation at that time showed very low AF burden of 0.6%.  She returns to clinic today for follow-up with her daughter. Weight is only down 1 lb. Abdominal distention persists. Lower extremity edema is much improved. She continues to admit drinking a lot of fluid a day (despite multiple visits reinforcing a strict 2L restriction) - sometimes up to nearly 4L per day in the form of 2 64-oz jugs. It sounds like she monitors her sodium intake. She denies any chest pain or SOB. She complains of generalized fatigue over the last several months. Prior labs 03/2016: K 3.9, BUN 29, Cr 1.10, albumin 4.2, BNP 304, TSH wnl, Hgb wnl.  Initial BP 186/94, came down to 139/75 on manual recheck by me. She reports h/o white coat HTN and states BP runs normally in the 120s-130s at home.   Past Medical History:  Diagnosis Date  . Breast cancer ([place])   . CAD (coronary artery disease)    a. s/p DESx2 to mid and distal RCA in 2010.  Marland Kitchen  Cholelithiasis 09/12/2013   Lap Chole on 09/14/13   . Chronic diastolic CHF (congestive heart failure) ([place])    a. Echo 06/09/15: EF 55-60%, normal wall motion, mild AI, mild to moderate MR, mild LAE, mildly reduced RVSF, mild RAE, moderate TR, PASP 53 mmHg  . CKD (chronic kidney disease), stage III   . Diabetes mellitus    NON INSULIN DEPENDENT  . DJD (degenerative joint disease)   . GERD (gastroesophageal reflux disease)   . Hemorrhoids   . History of diabetic neuropathy   . Hypercholesterolemia   . Hypertension   . Hypertensive heart disease   . Paroxysmal atrial fibrillation (H[facility])    a. discovered on PPM interrogation 12/15, CHAD2VASC score is 6. b. decompensation with dCHF in 06/2015 possibly due to AF, s/p DCCV.  . SSS (sick sinus syndrome) ([place])    a. s/p Medtronic PPM by JA for SSS and syncope 9/11.    Past Surgical History:  Procedure Laterality Date  . CARDIAC CATHETERIZATION  07/05/2009   EF 55-60%  . CARDIOVERSION N/A 06/11/2015   Procedure: CARDIOVERSION;  Surgeon: [name], MD;  Location: [facility] ENDOSCOPY;  Service: Cardiovascular;  Laterality: N/A;  . CHOLECYSTECTOMY N/A 09/14/2013   Procedure: LAPAROSCOPIC CHOLECYSTECTOMY WITH INTRAOPERATIVE CHOLANGIOGRAM;  Surgeon: [name], MD;  Location: [place];  Service: General;  Laterality: N/A;  . COLONOSCOPY    . ERCP N/A 09/13/2013   Procedure: ENDOSCOPIC RETROGRADE CHOLANGIOPANCREATOGRAPHY (  ERCP);  Surgeon: [name], MD;  Location: [place];  Service: Endoscopy;  Laterality: N/A;  . INSERT / REPLACE / REMOVE PACEMAKER  9/11   SSS and syncope, implanted by JA (MDT)  . MASTECTOMY  1992   BILATERAL WITH RECONSTRUCTION    Current Medications: Current Outpatient Prescriptions  Medication Sig Dispense Refill  . apixaban (ELIQUIS) 5 MG TABS tablet Take 5 mg by mouth 2 (two) times daily.    Marland Kitchen b complex vitamins tablet Take 1 tablet by mouth daily.    . calcium carbonate (OS-CAL) 600 MG TABS tablet Take 600  mg by mouth daily with breakfast.     . carvedilol (COREG) 25 MG tablet TAKE ONE TABLET BY MOUTH TWICE DAILY. 60 tablet 2  . Cyanocobalamin (VITAMIN B12 PO) Take 1 tablet by mouth daily.     . fish oil-omega-3 fatty acids 1000 MG capsule Take 1 g by mouth every morning.     . furosemide (LASIX) 40 MG tablet Take 1 tablet (40 mg total) by mouth 2 (two) times daily. 180 tablet 1  . gabapentin (NEURONTIN) 100 MG capsule Take 1 capsule (100 mg total) by mouth at bedtime. Take in addition to the 300 mg cap you are already taking at night 90 capsule 3  . gabapentin (NEURONTIN) 300 MG capsule TAKE ONE CAPSULE BY MOUTH TWICE DAILY 60 capsule 5  . hydrALAZINE (APRESOLINE) 25 MG tablet TAKE ONE TABLET BY MOUTH EVERY 8 HOURS 270 tablet 2  . lisinopril (PRINIVIL,ZESTRIL) 20 MG tablet Take 1 tablet (20 mg total) by mouth 2 (two) times daily. 180 tablet 3  . metFORMIN (GLUCOPHAGE) 500 MG tablet TAKE ONE TABLET BY MOUTH ONCE DAILY WITH BREAKFAST 90 tablet 3  . nitroGLYCERIN (NITROSTAT) 0.4 MG SL tablet Place 1 tablet (0.4 mg total) under the tongue every 5 (five) minutes as needed for chest pain. x3 doses as needed for chest pain 25 tablet prn  . polyethylene glycol (MIRALAX / GLYCOLAX) packet Take 17 g by mouth daily as needed for moderate constipation.    . rosuvastatin (CRESTOR) 10 MG tablet TAKE ONE TABLET BY MOUTH ONCE DAILY IN THE MORNING 90 tablet 3   No current facility-administered medications for this visit.      Allergies:   Lyrica [pregabalin]; Amlodipine; and Sulfonamide derivatives   Social History   Social History  . Marital status: Widowed    Spouse name: N/A  . Number of children: N/A  . Years of education: N/A   Social History Main Topics  . Smoking status: Never Smoker  . Smokeless tobacco: Never Used  . Alcohol use No  . Drug use: No  . Sexual activity: Not Asked   Other Topics Concern  . None   Social History Narrative  . None     Family History:  The patient's family  history includes Cancer in her father and mother.   ROS:   Please see the history of present illness.   All other systems are reviewed and otherwise negative.    PHYSICAL EXAM:   VS:  BP (!) 186/94   Pulse 87   Ht 5\' 3"  (1.6 m)   Wt 169 lb 6.4 oz (76.8 kg)   BMI 30.01 kg/m   BMI: Body mass index is 30.01 kg/m. GEN: Well nourished, well developed WF, in no acute distress  HEENT: normocephalic, atraumatic Neck: no JVD, carotid bruits, or masses Cardiac: RRR; no murmurs, rubs, or gallops, trace sockline edema  Respiratory: slightly diminished BS  throughout but otherwise clear to auscultation bilaterally, normal work of breathing GI: soft, slightly distended, + BS MS: no deformity or atrophy  Skin: warm and dry, no rash Neuro:  Alert and Oriented x 3, Strength and sensation are intact, follows commands Psych: euthymic mood, full affect  Wt Readings from Last 3 Encounters:  09/18/16 169 lb 6.4 oz (76.8 kg)  09/03/16 170 lb (77.1 kg)  09/03/16 170 lb (77.1 kg)      Studies/Labs Reviewed:   EKG:  EKG was not ordered today.  Recent Labs: 04/01/2016: ALT 11; Brain Natriuretic Peptide 304.7; BUN 29; Creat 1.10; Hemoglobin 12.6; Platelets 276; Potassium 3.9; Sodium 142; TSH 2.95   Lipid Panel    Component Value Date/Time   CHOL 101 06/09/2015 0330   TRIG 52 06/09/2015 0330   HDL 38 (L) 06/09/2015 0330   CHOLHDL 2.7 06/09/2015 0330   VLDL 10 06/09/2015 0330   LDLCALC 53 06/09/2015 0330   LDLDIRECT 152.0 10/10/2013 1619    Additional studies/ records that were reviewed today include: Summarized above.    ASSESSMENT & PLAN:   1. Chronic diastolic CHF/hypertensive heart disease - her abdominal distention continues. LEE has improved. Will recheck labs today -- suspect component of right heart failure related to her pulm HTN. She may require a higher dose of Lasix long-term as she remains noncompliant with strict fluid restriction. (Had long discussion with her about this in  May as well.) I am interested to see if her BNP has moved any. Her daughter [name] is our point of contact to call with labs (per patient's permission).  2. CKD stage III - f/u BMET today. 3. Paroxysmal atrial fib - low burden by pacemaker. Continue Eliquis. Given her reports of fatigue will check CBC to exclude any anemia. 4. CAD - she is not describing any recurrent symptoms of angina. Continue BB, statin. Not on ASA due to ongoing Eliquis use.  Disposition: F/u with me in 2 weeks to trend weight and blood pressure. She also has f/u with her PCP - given her abdominal distention and nondescript fatigue this is important as well.   Medication Adjustments/Labs and Tests Ordered: Current medicines are reviewed at length with the patient today.  Concerns regarding medicines are outlined above. Medication changes, Labs and Tests ordered today are summarized above and listed in the Patient Instructions accessible in Encounters.   Raechel Ache PA-C  09/18/2016 3:20 PM    [facility] [place], [place], [place]  [address] Phone: [phone]; Fax: [phone]

## 2016-09-18 NOTE — Patient Instructions (Addendum)
Medication Instructions:  Your physician recommends that you continue on your current medications as directed. Please refer to the Current Medication list given to you today.   Labwork: None ordered  Testing/Procedures: TODAY:  BMET, CBC, & BNP  Follow-Up: Your physician recommends that you schedule a follow-up appointment in: 2 WEEKS WITH DAYNA DUNN, PA-C   Any Other Special Instructions Will Be Listed Below (If Applicable). YOU ARE ON A STRICT FLUID RESTRICTION OF NO MORE THAN 64 OZ OF FLUIDS DAILY ( THIS INCLUDES FLUIDS IN SOUP, CEREAL, ETC...)  If you need a refill on your cardiac medications before your next appointment, please call your pharmacy.

## 2016-09-19 LAB — BRAIN NATRIURETIC PEPTIDE: BRAIN NATRIURETIC PEPTIDE: 283 pg/mL — AB (ref <100)

## 2016-09-30 NOTE — Progress Notes (Signed)
Cardiology Office Note    Date:  10/01/2016  ID:  Jessica [name], DOB 07/17/1927, MRN [id number] PCP:  Binnie Rail, MD  Cardiologist:  [name]   Chief Complaint: f/u edema  History of Present Illness:  Jessica [name] is a 82 y.o. female with history of CAD (DESx2 to mid and distal RCA in 2010), chronic diastolic CHF, prior noncompliance with fluid restriction, hypertensive heart disease, DM with peripheral neuropathy, HTN, HLD, SSS s/p Medtronic pacemaker 2011, PAF (discovered on device interrogation 10/2014), CKD stage III, moderate pulm HTN (by last echo) who presents for f/u of edema. She was last hospitalized for acute diastolic CHF in 123XX123 in the setting of accelerated HTN and PAF. She underwent DCCV at that time. Echo 06/2015: EF 55-60%, no RWMA, mild AI, mild-mod MR, mild LAE/RAE, mildly reduced RV function, mod TR, PASP 18mmHg. She was seen by Dr. [name] 09/03/16 at which time she had worsening LEE, crackles, increased abdominal girth and weight gain. Lasix was increased to 40mg  BID and HCTZ was stopped. Pacer interrogation at that time showed very low AF burden of 0.6%. She returned to clinic 09/18/16 - weight was only down 1 lb but LEE was improving. She continued to report drinking up to 4L of water per day however. Repeat BMET 09/18/16 showed BUN 34/Cr 1.42 (up from baseline around 1.1), CBC wnl, BNP 283. She was advised to decrease Lasix to 40mg  daily. As I suspected her BP would further increase with drop in diuretic dose, I pre-emptively increased hydralazine to 50mg  TID (BP was 186/94 at last OV).   She returns for follow-up today with her daughter. BP is running on the softer side. She brings a log of her BP and they are all in the [id number] range systolic now. She is finally drinking significantly less water. Her LEE has resolved. She still has some increased abdominal girth but in talking to her further, she greatly reduced her physical activity after a fall last year so there is  some suspected increase in body weight as well. No CP or SOB. Her daughter is concerned because she naps a lot during the day but she also reports inconsistent nighttime sleep, often going to bed very late. Last night she went to bed after 1am and woke up at 5:30am.  Past Medical History:  Diagnosis Date  . Breast cancer ([place])   . CAD (coronary artery disease)    a. s/p DESx2 to mid and distal RCA in 2010.  Marland Kitchen Cholelithiasis 09/12/2013   Lap Chole on 09/14/13   . Chronic diastolic CHF (congestive heart failure) ([place])    a. Echo 06/09/15: EF 55-60%, normal wall motion, mild AI, mild to moderate MR, mild LAE, mildly reduced RVSF, mild RAE, moderate TR, PASP 53 mmHg  . CKD (chronic kidney disease), stage III   . Diabetes mellitus    NON INSULIN DEPENDENT  . DJD (degenerative joint disease)   . GERD (gastroesophageal reflux disease)   . Hemorrhoids   . History of diabetic neuropathy   . Hypercholesterolemia   . Hypertension   . Hypertensive heart disease   . Paroxysmal atrial fibrillation (H[facility])    a. discovered on PPM interrogation 12/15, CHAD2VASC score is 6. b. decompensation with dCHF in 06/2015 possibly due to AF, s/p DCCV.  . SSS (sick sinus syndrome) ([place])    a. s/p Medtronic PPM by JA for SSS and syncope 9/11.    Past Surgical History:  Procedure Laterality Date  . CARDIAC CATHETERIZATION  07/05/2009   EF 55-60%  . CARDIOVERSION N/A 06/11/2015   Procedure: CARDIOVERSION;  Surgeon: [name], MD;  Location: [facility] ENDOSCOPY;  Service: Cardiovascular;  Laterality: N/A;  . CHOLECYSTECTOMY N/A 09/14/2013   Procedure: LAPAROSCOPIC CHOLECYSTECTOMY WITH INTRAOPERATIVE CHOLANGIOGRAM;  Surgeon: [name], MD;  Location: [place];  Service: General;  Laterality: N/A;  . COLONOSCOPY    . ERCP N/A 09/13/2013   Procedure: ENDOSCOPIC RETROGRADE CHOLANGIOPANCREATOGRAPHY (ERCP);  Surgeon: [name], MD;  Location: [facility] ENDOSCOPY;  Service: Endoscopy;  Laterality: N/A;  . INSERT / REPLACE  / REMOVE PACEMAKER  9/11   SSS and syncope, implanted by JA (MDT)  . MASTECTOMY  1992   BILATERAL WITH RECONSTRUCTION    Current Medications: Current Outpatient Prescriptions  Medication Sig Dispense Refill  . apixaban (ELIQUIS) 5 MG TABS tablet Take 5 mg by mouth 2 (two) times daily.    Marland Kitchen b complex vitamins tablet Take 1 tablet by mouth daily.    . calcium carbonate (OS-CAL) 600 MG TABS tablet Take 600 mg by mouth daily with breakfast.     . carvedilol (COREG) 25 MG tablet TAKE ONE TABLET BY MOUTH TWICE DAILY. 60 tablet 2  . Cyanocobalamin (VITAMIN B12 PO) Take 1 tablet by mouth daily.     . fish oil-omega-3 fatty acids 1000 MG capsule Take 1 g by mouth every morning.     . furosemide (LASIX) 40 MG tablet Take 1 tablet (40 mg total) by mouth 2 (two) times daily. (Patient taking differently: Take 40 mg by mouth daily per phone instructions. ) 180 tablet 1  . gabapentin (NEURONTIN) 100 MG capsule Take 1 capsule (100 mg total) by mouth at bedtime. Take in addition to the 300 mg cap you are already taking at night 90 capsule 3  . gabapentin (NEURONTIN) 300 MG capsule TAKE ONE CAPSULE BY MOUTH TWICE DAILY 60 capsule 5  . hydrALAZINE (APRESOLINE) 25 MG tablet TAKE ONE TABLET BY MOUTH EVERY 8 HOURS - (taking 2 tabs q8hr per recent phone instructions) 270 tablet 2  . lisinopril (PRINIVIL,ZESTRIL) 20 MG tablet Take 1 tablet (20 mg total) by mouth 2 (two) times daily. 180 tablet 3  . metFORMIN (GLUCOPHAGE) 500 MG tablet TAKE ONE TABLET BY MOUTH ONCE DAILY WITH BREAKFAST 90 tablet 3  . nitroGLYCERIN (NITROSTAT) 0.4 MG SL tablet Place 1 tablet (0.4 mg total) under the tongue every 5 (five) minutes as needed for chest pain. x3 doses as needed for chest pain 25 tablet prn  . polyethylene glycol (MIRALAX / GLYCOLAX) packet Take 17 g by mouth daily as needed for moderate constipation.    . rosuvastatin (CRESTOR) 10 MG tablet TAKE ONE TABLET BY MOUTH ONCE DAILY IN THE MORNING 90 tablet 3   No current  facility-administered medications for this visit.      Allergies:   Lyrica [pregabalin]; Amlodipine; and Sulfonamide derivatives   Social History   Social History  . Marital status: Widowed    Spouse name: N/A  . Number of children: N/A  . Years of education: N/A   Social History Main Topics  . Smoking status: Never Smoker  . Smokeless tobacco: Never Used  . Alcohol use No  . Drug use: No  . Sexual activity: Not Asked   Other Topics Concern  . None   Social History Narrative  . None     Family History:  The patient's family history includes Cancer in her father and mother.   ROS:   Please see  the history of present illness. Otherwise, review of systems is positive for insomnia. All other systems are reviewed and otherwise negative.    PHYSICAL EXAM:   VS:  BP (!) 100/50   Pulse 68   Ht 5\' 3"  (1.6 m)   Wt 169 lb 4 oz (76.8 kg)   SpO2 98%   BMI 29.98 kg/m   BMI: Body mass index is 29.98 kg/m. GEN: Well nourished, well developed obese WF in no acute distress  HEENT: normocephalic, atraumatic Neck: no JVD, carotid bruits, or masses Cardiac: RRR; no murmurs, rubs, or gallops, edema has resolved, varicose veins noted Respiratory:  clear to auscultation bilaterally, normal work of breathing GI: rounded but soft, nontender, + BS MS: no deformity or atrophy  Skin: warm and dry, no rash Neuro:  Alert and Oriented x 3, Strength and sensation are intact, follows commands Psych: euthymic mood, full affect  Wt Readings from Last 3 Encounters:  10/01/16 169 lb 4 oz (76.8 kg)  09/18/16 169 lb 6.4 oz (76.8 kg)  09/03/16 170 lb (77.1 kg)      Studies/Labs Reviewed:   EKG:  EKG was not ordered today.  Recent Labs: 04/01/2016: ALT 11; TSH 2.95 09/18/2016: Brain Natriuretic Peptide 283.0; BUN 34; Creat 1.42; Hemoglobin 11.8; Platelets 296; Potassium 4.0; Sodium 142   Lipid Panel    Component Value Date/Time   CHOL 101 06/09/2015 0330   TRIG 52 06/09/2015 0330   HDL  38 (L) 06/09/2015 0330   CHOLHDL 2.7 06/09/2015 0330   VLDL 10 06/09/2015 0330   LDLCALC 53 06/09/2015 0330   LDLDIRECT 152.0 10/10/2013 1619    Additional studies/ records that were reviewed today include: Summarized above.    ASSESSMENT & PLAN:   1. Chronic diastolic CHF/hypertensive heart disease - her edema has resolved now that she has begun adhering to the 2L fluid restriction. Have advised her to continue Lasix 1 tablet daily and take 1 extra tablet only as needed for increased edema. She still has some increased abdominal girth but in talking to her further, she greatly reduced her physical activity after a fall last year so there is some suspected increase in body weight as well. Need to be cautious with excess diuretics since she has renal disease. Will need to keep an eye on this.  2. CKD stage III - recheck labs today. 3. HTN - BP is now on the lower side. Reduce hydralazine back to 1 tablet q8hrs. 4. Paroxysmal atrial fib - low burden by recent interrogation of pacemaker. Continue Eliquis. Need to watch Cr carefully because if this goes above 1.5, will need to reduce Eliquis dose. 5. CAD- she is not describing any recurrent symptoms of angina. Continue BB, statin. Not on ASA due to ongoing Eliquis use.  Disposition: F/u with Dr. [name] in 3 months. Also instructed to f/u with PCP given her abdominal distention, insomnia and nondescript fatigue. We reviewed importance of sleep hygiene.   Medication Adjustments/Labs and Tests Ordered: Current medicines are reviewed at length with the patient today.  Concerns regarding medicines are outlined above. Medication changes, Labs and Tests ordered today are summarized above and listed in the Patient Instructions accessible in Encounters.   Raechel Ache PA-C  10/01/2016 10:30 AM    [facility], [place], [place]  [address] Phone: [phone]; Fax: [phone]

## 2016-10-01 ENCOUNTER — Encounter: Payer: Self-pay | Admitting: Physician Assistant

## 2016-10-01 ENCOUNTER — Ambulatory Visit (INDEPENDENT_AMBULATORY_CARE_PROVIDER_SITE_OTHER): Payer: Medicare Other | Admitting: Physician Assistant

## 2016-10-01 VITALS — BP 100/50 | HR 68 | Ht 63.0 in | Wt 169.2 lb

## 2016-10-01 DIAGNOSIS — I480 Paroxysmal atrial fibrillation: Secondary | ICD-10-CM

## 2016-10-01 DIAGNOSIS — I110 Hypertensive heart disease with heart failure: Secondary | ICD-10-CM

## 2016-10-01 DIAGNOSIS — I10 Essential (primary) hypertension: Secondary | ICD-10-CM

## 2016-10-01 DIAGNOSIS — N183 Chronic kidney disease, stage 3 unspecified: Secondary | ICD-10-CM

## 2016-10-01 DIAGNOSIS — I5032 Chronic diastolic (congestive) heart failure: Secondary | ICD-10-CM | POA: Diagnosis not present

## 2016-10-01 DIAGNOSIS — I2510 Atherosclerotic heart disease of native coronary artery without angina pectoris: Secondary | ICD-10-CM

## 2016-10-01 LAB — COMPREHENSIVE METABOLIC PANEL
ALK PHOS: 62 U/L (ref 33–130)
ALT: 10 U/L (ref 6–29)
AST: 14 U/L (ref 10–35)
Albumin: 3.6 g/dL (ref 3.6–5.1)
BUN: 34 mg/dL — AB (ref 7–25)
CALCIUM: 9.9 mg/dL (ref 8.6–10.4)
CO2: 27 mmol/L (ref 20–31)
Chloride: 107 mmol/L (ref 98–110)
Creat: 1.58 mg/dL — ABNORMAL HIGH (ref 0.60–0.88)
GLUCOSE: 100 mg/dL — AB (ref 65–99)
POTASSIUM: 3.8 mmol/L (ref 3.5–5.3)
Sodium: 143 mmol/L (ref 135–146)
Total Bilirubin: 0.5 mg/dL (ref 0.2–1.2)
Total Protein: 6.3 g/dL (ref 6.1–8.1)

## 2016-10-01 MED ORDER — FUROSEMIDE 40 MG PO TABS: ORAL_TABLET | ORAL | 1 refills | Status: DC

## 2016-10-01 NOTE — Patient Instructions (Signed)
Medication Instructions:  Your physician has recommended you make the following change in your medication:  1.  REDUCE the Hydralazine 25 mg to 1 tablet every 8 hours 2.  TAKE Lasix 40 mg 1 tablet daily but may take 1 extra as needed on a daily basis for edema  Labwork: TODAY:  CMET  Testing/Procedures: None ordere  Follow-Up: Your physician recommends that you schedule a follow-up appointment in: 3 MONTHS WITH DR. [name]   Any Other Special Instructions Will Be Listed Below (If Applicable).  BRING YOUR MEDICATIONS TO YOUR NEXT APPOINTMENT   If you need a refill on your cardiac medications before your next appointment, please call your pharmacy.

## 2016-10-07 ENCOUNTER — Other Ambulatory Visit (INDEPENDENT_AMBULATORY_CARE_PROVIDER_SITE_OTHER): Payer: Medicare Other

## 2016-10-07 ENCOUNTER — Telehealth: Payer: Self-pay | Admitting: *Deleted

## 2016-10-07 DIAGNOSIS — R7989 Other specified abnormal findings of blood chemistry: Secondary | ICD-10-CM

## 2016-10-07 DIAGNOSIS — R748 Abnormal levels of other serum enzymes: Secondary | ICD-10-CM

## 2016-10-07 LAB — BASIC METABOLIC PANEL
BUN: 23 mg/dL (ref 7–25)
CHLORIDE: 107 mmol/L (ref 98–110)
CO2: 28 mmol/L (ref 20–31)
CREATININE: 1.33 mg/dL — AB (ref 0.60–0.88)
Calcium: 9.3 mg/dL (ref 8.6–10.4)
Glucose, Bld: 120 mg/dL — ABNORMAL HIGH (ref 65–99)
Potassium: 4 mmol/L (ref 3.5–5.3)
Sodium: 144 mmol/L (ref 135–146)

## 2016-10-07 NOTE — Telephone Encounter (Signed)
-----   Message from Charlie Pitter, [place] sent at 10/02/2016  4:39 PM EST ----- Kidney function continues to remain elevated. She's been trending up over the last few values. Her kidneys may be adjusting to the change in water intake (she was previously drinking too much water for her congestive heart failure). Will need to recheck this early next week to trend. If it remains up her medications may need further adjusting (diuretic, Eliquis). Dayna Dunn PA-C

## 2016-10-07 NOTE — Telephone Encounter (Signed)
Pt daughter [name], [place] on file, has been made aware of pts lab results. She will bring pt by tomorrow, 10/08/16 for repeat bmet.

## 2016-10-07 NOTE — Addendum Note (Signed)
Addended by: Gaetano Net on: 10/07/2016 11:08 AM   Modules accepted: Orders

## 2016-10-08 ENCOUNTER — Other Ambulatory Visit: Payer: Medicare Other

## 2016-10-09 ENCOUNTER — Ambulatory Visit: Payer: Medicare Other | Admitting: Physician Assistant

## 2016-10-16 ENCOUNTER — Other Ambulatory Visit: Payer: Self-pay | Admitting: Cardiology

## 2016-10-21 ENCOUNTER — Ambulatory Visit (INDEPENDENT_AMBULATORY_CARE_PROVIDER_SITE_OTHER): Payer: Medicare Other | Admitting: Internal Medicine

## 2016-10-21 ENCOUNTER — Encounter: Payer: Self-pay | Admitting: Internal Medicine

## 2016-10-21 VITALS — BP 136/76 | HR 79 | Temp 98.0°F | Resp 16 | Wt 171.0 lb

## 2016-10-21 DIAGNOSIS — E1140 Type 2 diabetes mellitus with diabetic neuropathy, unspecified: Secondary | ICD-10-CM

## 2016-10-21 DIAGNOSIS — Z23 Encounter for immunization: Secondary | ICD-10-CM

## 2016-10-21 DIAGNOSIS — N183 Chronic kidney disease, stage 3 unspecified: Secondary | ICD-10-CM

## 2016-10-21 DIAGNOSIS — I10 Essential (primary) hypertension: Secondary | ICD-10-CM | POA: Diagnosis not present

## 2016-10-21 DIAGNOSIS — E7800 Pure hypercholesterolemia, unspecified: Secondary | ICD-10-CM

## 2016-10-21 DIAGNOSIS — E0842 Diabetes mellitus due to underlying condition with diabetic polyneuropathy: Secondary | ICD-10-CM

## 2016-10-21 DIAGNOSIS — R600 Localized edema: Secondary | ICD-10-CM

## 2016-10-21 DIAGNOSIS — I5033 Acute on chronic diastolic (congestive) heart failure: Secondary | ICD-10-CM

## 2016-10-21 LAB — POCT GLYCOSYLATED HEMOGLOBIN (HGB A1C): Hemoglobin A1C: 6

## 2016-10-21 MED ORDER — ROLLATOR ULTRA-LIGHT MISC: 0 refills | Status: DC

## 2016-10-21 MED ORDER — BLOOD GLUCOSE MONITOR KIT: PACK | 0 refills | Status: DC

## 2016-10-21 MED ORDER — NITROGLYCERIN 0.4 MG SL SUBL: 0.4000 mg | SUBLINGUAL_TABLET | SUBLINGUAL | 0 refills | Status: DC | PRN

## 2016-10-21 NOTE — Assessment & Plan Note (Signed)
Continue statin. 

## 2016-10-21 NOTE — Assessment & Plan Note (Signed)
Increased edema recently - due to increased salt intake Took extra lasix today She will decrease salt intake - discussed concern with going into CHF Extra lasix prn

## 2016-10-21 NOTE — Assessment & Plan Note (Signed)
GFR stable by recent cmp

## 2016-10-21 NOTE — Progress Notes (Signed)
Pre visit review using our clinic review tool, if applicable. No additional management support is needed unless otherwise documented below in the visit note. 

## 2016-10-21 NOTE — Assessment & Plan Note (Signed)
Taking gabapentin Limits her ability to ambulate Uses a cane and would benefit from a Rollator - rx given

## 2016-10-21 NOTE — Assessment & Plan Note (Signed)
BP well controlled Current regimen effective and well tolerated Continue current medications at current doses cmp done recently - reviewed 

## 2016-10-21 NOTE — Assessment & Plan Note (Signed)
Slightly more fluid than usual - likely related to increased sodium intake Advised decreasing sodium intake Has taken an extra lasix and will repeat in the next week if needed No increased SOB and lungs are clear

## 2016-10-21 NOTE — Assessment & Plan Note (Addendum)
Checks sugars once daily - needs new glucometer - rx given Continue current dose of metformin Has not been as compliant with a low sugar diet, but after holidays will be more compliant Sugars still ok at home a1c today 6.0

## 2016-10-21 NOTE — Patient Instructions (Addendum)
  Your a1c was checked today.    Prevnar immunization administered today.   Medications reviewed and updated.   No changes recommended at this time.  Your prescription(s) have been submitted to your pharmacy. Please take as directed and contact our office if you believe you are having problem(s) with the medication(s).   Please followup in 6 months

## 2016-10-21 NOTE — Progress Notes (Signed)
Subjective:    Patient ID: Jessica [name], female    DOB: [birth year], 82 y.o.   MRN: [id number]  HPI The patient is here for follow up.  Hypertension: She is taking her medication daily. She is typically compliant with a low sodium diet, but has not been recently with the holidays.  She has had some increased edema.  Today is the first time she took an extra lasix.  She has chronic SOB that is unchanged from baseline.  She  denies chest pain, palpitations, and regular headaches. She is not exercising regularly.  She does monitor her blood pressure at home and it has been well controlled - she brought her log today.    Hyperlipidemia: She is taking her medication daily. She is compliant with a low fat/cholesterol diet. She is not exercising regularly. She denies myalgias.   Diabetes: She is taking her medication daily as prescribed. She has not been as compliant with a diabetic diet in the past few weeks. She is not exercising regularly. She monitors her sugars and they have been good  - it was 125 yesterday.   CKD:  She had recent blood work done and her kidney function is stable.    Difficulty ambulating:  She has difficulty walking due to diabetic neuropathy and arthritis in her knees. She currently uses a cane and is requesting a prescription for a rollator with a seat.  She would like to walk further but often needs to rest for one minute - after a short rest she can walk again.    Medications and allergies reviewed with patient and updated if appropriate.  Patient Active Problem List   Diagnosis Date Noted  . Diastolic dysfunction with acute on chronic heart failure ([place]) 05/15/2016  . Hypertensive heart disease 05/15/2016  . Acute on chronic diastolic CHF (congestive heart failure), NYHA class 2 ([place]) 05/15/2016  . Bilateral leg edema 04/29/2016  . AF (paroxysmal atrial fibrillation) ([place]) 06/11/2015  . Hypokalemia 06/10/2015  . CKD (chronic kidney disease) stage 3, GFR 30-59  ml/min 06/10/2015  . CAD (coronary artery disease) 06/09/2015  . Diabetic neuropathy ([place]) 07/06/2013  . Diastolic dysfunction A999333  . SSS (sick sinus syndrome) ([place]) 02/10/2013  . Hypertension 07/30/2012  . Pacemaker-Medtronic 07/19/2012  . Benign hypertensive heart disease with heart failure ([place]) 01/28/2012  . Diabetes ([place]) 10/22/2010  . HYPERCHOLESTEROLEMIA 10/22/2010  . Angina pectoris ([place]) 10/22/2010  . DEGENERATIVE JOINT DISEASE 10/22/2010    Current Outpatient Prescriptions on File Prior to Visit  Medication Sig Dispense Refill  . apixaban (ELIQUIS) 5 MG TABS tablet Take 5 mg by mouth 2 (two) times daily.    Marland Kitchen b complex vitamins tablet Take 1 tablet by mouth daily.    . carvedilol (COREG) 25 MG tablet TAKE ONE TABLET BY MOUTH TWICE DAILY 180 tablet 3  . fish oil-omega-3 fatty acids 1000 MG capsule Take 1 g by mouth every morning.     . furosemide (LASIX) 40 MG tablet Take 1 tablet by mouth daily, may take 1 extra tablet daily as needed for edema 120 tablet 1  . gabapentin (NEURONTIN) 100 MG capsule Take 1 capsule (100 mg total) by mouth at bedtime. Take in addition to the 300 mg cap you are already taking at night 90 capsule 3  . gabapentin (NEURONTIN) 300 MG capsule TAKE ONE CAPSULE BY MOUTH TWICE DAILY 60 capsule 5  . hydrALAZINE (APRESOLINE) 25 MG tablet TAKE ONE TABLET BY MOUTH EVERY 8 HOURS 270 tablet  2  . lisinopril (PRINIVIL,ZESTRIL) 20 MG tablet Take 1 tablet (20 mg total) by mouth 2 (two) times daily. 180 tablet 3  . metFORMIN (GLUCOPHAGE) 500 MG tablet TAKE ONE TABLET BY MOUTH ONCE DAILY WITH BREAKFAST 90 tablet 3  . nitroGLYCERIN (NITROSTAT) 0.4 MG SL tablet Place 1 tablet (0.4 mg total) under the tongue every 5 (five) minutes as needed for chest pain. x3 doses as needed for chest pain 25 tablet prn  . rosuvastatin (CRESTOR) 10 MG tablet TAKE ONE TABLET BY MOUTH ONCE DAILY IN THE MORNING 90 tablet 3   No current facility-administered medications on file prior  to visit.     Past Medical History:  Diagnosis Date  . Breast cancer ([place])   . CAD (coronary artery disease)    a. s/p DESx2 to mid and distal RCA in 2010.  Marland Kitchen Cholelithiasis 09/12/2013   Lap Chole on 09/14/13   . Chronic diastolic CHF (congestive heart failure) ([place])    a. Echo 06/09/15: EF 55-60%, normal wall motion, mild AI, mild to moderate MR, mild LAE, mildly reduced RVSF, mild RAE, moderate TR, PASP 53 mmHg  . CKD (chronic kidney disease), stage III   . Diabetes mellitus    NON INSULIN DEPENDENT  . DJD (degenerative joint disease)   . GERD (gastroesophageal reflux disease)   . Hemorrhoids   . History of diabetic neuropathy   . Hypercholesterolemia   . Hypertension   . Hypertensive heart disease   . Paroxysmal atrial fibrillation ([facility])    a. discovered on PPM interrogation 12/15, CHAD2VASC score is 6. b. decompensation with dCHF in 06/2015 possibly due to AF, s/p DCCV.  . SSS (sick sinus syndrome) ([place])    a. s/p Medtronic PPM by JA for SSS and syncope 9/11.    Past Surgical History:  Procedure Laterality Date  . CARDIAC CATHETERIZATION  07/05/2009   EF 55-60%  . CARDIOVERSION N/A 06/11/2015   Procedure: CARDIOVERSION;  Surgeon: [name], MD;  Location: [facility] ENDOSCOPY;  Service: Cardiovascular;  Laterality: N/A;  . CHOLECYSTECTOMY N/A 09/14/2013   Procedure: LAPAROSCOPIC CHOLECYSTECTOMY WITH INTRAOPERATIVE CHOLANGIOGRAM;  Surgeon: [name], MD;  Location: [facility];  Service: General;  Laterality: N/A;  . COLONOSCOPY    . ERCP N/A 09/13/2013   Procedure: ENDOSCOPIC RETROGRADE CHOLANGIOPANCREATOGRAPHY (ERCP);  Surgeon: [name], MD;  Location: [facility] ENDOSCOPY;  Service: Endoscopy;  Laterality: N/A;  . INSERT / REPLACE / REMOVE PACEMAKER  9/11   SSS and syncope, implanted by JA (MDT)  . MASTECTOMY  1992   BILATERAL WITH RECONSTRUCTION    Social History   Social History  . Marital status: Widowed    Spouse name: N/A  . Number of children: N/A  . Years of  education: N/A   Social History Main Topics  . Smoking status: Never Smoker  . Smokeless tobacco: Never Used  . Alcohol use No  . Drug use: No  . Sexual activity: Not Asked   Other Topics Concern  . None   Social History Narrative  . None    Family History  Problem Relation Age of Onset  . Cancer Mother   . Cancer Father     Review of Systems  Constitutional: Negative for chills and fever.  HENT: Positive for congestion and postnasal drip. Negative for sinus pain, sinus pressure and sore throat.   Respiratory: Positive for shortness of breath (occ). Negative for cough and wheezing.   Cardiovascular: Positive for leg swelling (today). Negative for chest pain  and palpitations.  Gastrointestinal: Negative for abdominal pain.  Neurological: Negative for light-headedness and headaches.       Objective:   Vitals:   10/21/16 1531  BP: 136/76  Pulse: 79  Resp: 16  Temp: 98 F (36.7 C)   Filed Weights   10/21/16 1531  Weight: 171 lb (77.6 kg)   Body mass index is 30.29 kg/m.   Physical Exam    Constitutional: Appears well-developed and well-nourished. No distress.  HENT:  Head: Normocephalic and atraumatic.  Neck: Neck supple. No tracheal deviation present. No thyromegaly present.  No cervical lymphadenopathy Cardiovascular: Normal rate, regular rhythm and normal heart sounds.   1/6 systolic murmur heard. No carotid bruit .  1+ pitting edema b/l LE Pulmonary/Chest: Effort normal and breath sounds normal. No respiratory distress. No has no wheezes. No rales.  Skin: Skin is warm and dry. Not diaphoretic.  Psychiatric: Normal mood and affect. Behavior is normal.      Assessment & Plan:    See Problem List for Assessment and Plan of chronic medical problems.   Fu in 6 months, sooner if needed - htn, dm w/ neuropathy, chol, edema

## 2016-10-22 ENCOUNTER — Telehealth: Payer: Self-pay | Admitting: Internal Medicine

## 2016-10-22 NOTE — Telephone Encounter (Signed)
Pharmacy is:  [facility]  The # is : [phone]  She said if you have any questions call [name] back.

## 2016-10-30 NOTE — Telephone Encounter (Signed)
Called number given by patient. RX has been faxed.   Fax number for diabetic supplies [phone]

## 2016-11-25 ENCOUNTER — Ambulatory Visit: Payer: Medicare Other | Admitting: Podiatry

## 2016-11-28 ENCOUNTER — Ambulatory Visit (INDEPENDENT_AMBULATORY_CARE_PROVIDER_SITE_OTHER): Payer: Medicare Other | Admitting: Podiatry

## 2016-11-28 ENCOUNTER — Encounter: Payer: Self-pay | Admitting: Podiatry

## 2016-11-28 DIAGNOSIS — M79676 Pain in unspecified toe(s): Secondary | ICD-10-CM

## 2016-11-28 DIAGNOSIS — Q828 Other specified congenital malformations of skin: Secondary | ICD-10-CM

## 2016-11-28 DIAGNOSIS — E1140 Type 2 diabetes mellitus with diabetic neuropathy, unspecified: Secondary | ICD-10-CM

## 2016-11-28 DIAGNOSIS — B351 Tinea unguium: Secondary | ICD-10-CM | POA: Diagnosis not present

## 2016-12-01 ENCOUNTER — Other Ambulatory Visit: Payer: Self-pay | Admitting: *Deleted

## 2016-12-01 MED ORDER — APIXABAN 5 MG PO TABS: 5.0000 mg | ORAL_TABLET | Freq: Two times a day (BID) | ORAL | 2 refills | Status: DC

## 2016-12-03 ENCOUNTER — Ambulatory Visit (INDEPENDENT_AMBULATORY_CARE_PROVIDER_SITE_OTHER): Payer: Medicare Other | Admitting: *Deleted

## 2016-12-03 ENCOUNTER — Telehealth: Payer: Self-pay | Admitting: Cardiology

## 2016-12-03 DIAGNOSIS — I495 Sick sinus syndrome: Secondary | ICD-10-CM

## 2016-12-03 NOTE — Progress Notes (Signed)
Subjective: 83 y.o. returns the office today for painful, elongated, thickened toenails which she cannot trim herself. Denies any redness or drainage around the nails. She also has a callus to the bottom of her left foot. She's been using over-the-counter corn remover which is been helping but does continue. Denies any redness or drainage or any swelling. Denies any acute changes since last appointment and no new complaints today. Denies any systemic complaints such as fevers, chills, nausea, vomiting.   Objective: AAO 3, NAD DP/PT pulses palpable, CRT less than 3 seconds  Nails hypertrophic, dystrophic, elongated, brittle, discolored 10. There is tenderness overlying the nails 1-5 bilaterally. There is no surrounding erythema or drainage along the nail sites. Hyperkeratotic lesion submetatarsal foot. Upon debridement there is no underlying ulceration, drainage or any signs of infection. There was macerated tissue from the corn remover pad. There is no signs of infection. No open lesions or pre-ulcerative lesions are identified. No other areas of tenderness bilateral lower extremities. No overlying edema, erythema, increased warmth. No pain with calf compression, swelling, warmth, erythema.  Assessment: Patient presents with symptomatic onychomycosis; porokeratosis  Plan: -Treatment options including alternatives, risks, complications were discussed -Nails sharply debrided 10 without complication/bleeding. -Hyperkeratotic lesion was debrided 1 without complications or bleeding. Recommended her not to use over-the-counter corn remover pads at this time, the area completely healed. Monitor for any skin breakdown. -Discussed daily foot inspection. If there are any changes, to call the office immediately.  -Follow-up in 3 months or sooner if any problems are to arise. In the meantime, encouraged to call the office with any questions, concerns, changes symptoms.  Jessica [name], DPM

## 2016-12-03 NOTE — Telephone Encounter (Signed)
Confirmed remote transmission w/ pt daughter.   

## 2016-12-04 ENCOUNTER — Encounter: Payer: Self-pay | Admitting: Cardiology

## 2016-12-04 NOTE — Progress Notes (Signed)
Remote pacemaker transmission.   

## 2016-12-15 ENCOUNTER — Ambulatory Visit: Payer: Medicare Other | Admitting: Cardiology

## 2016-12-15 LAB — CUP PACEART REMOTE DEVICE CHECK
Battery Impedance: 439 Ohm
Battery Remaining Longevity: 92 mo
Battery Voltage: 2.79 V
Brady Statistic AP VP Percent: 22 %
Implantable Lead Implant Date: 20110912
Implantable Lead Location: 753859
Implantable Lead Model: 5092
Implantable Pulse Generator Implant Date: 20110912
Lead Channel Impedance Value: 467 Ohm
Lead Channel Pacing Threshold Amplitude: 0.625 V
Lead Channel Setting Pacing Amplitude: 2 V
Lead Channel Setting Pacing Amplitude: 2.5 V
Lead Channel Setting Pacing Pulse Width: 0.4 ms
Lead Channel Setting Sensing Sensitivity: 2 mV
MDC IDC LEAD IMPLANT DT: 20110912
MDC IDC LEAD LOCATION: 753860
MDC IDC MSMT LEADCHNL RA PACING THRESHOLD AMPLITUDE: 0.75 V
MDC IDC MSMT LEADCHNL RA PACING THRESHOLD PULSEWIDTH: 0.4 ms
MDC IDC MSMT LEADCHNL RV IMPEDANCE VALUE: 678 Ohm
MDC IDC MSMT LEADCHNL RV PACING THRESHOLD PULSEWIDTH: 0.4 ms
MDC IDC SESS DTM: 20180131195621
MDC IDC STAT BRADY AP VS PERCENT: 61 %
MDC IDC STAT BRADY AS VP PERCENT: 1 %
MDC IDC STAT BRADY AS VS PERCENT: 17 %

## 2016-12-22 ENCOUNTER — Telehealth: Payer: Self-pay

## 2016-12-22 NOTE — Telephone Encounter (Signed)
Called Jessica [name], asked if she was having any SOB, dizziness or symptoms associated with afib pt stated that she was feeling fine in that regard the only thing that was bothering her was her feet.

## 2016-12-22 NOTE — Telephone Encounter (Signed)
-----   Message from Thompson Grayer, MD sent at 12/21/2016 10:15 PM EST ----- Remote device check reviewed.   Device report notable for:  Spike in afib burdenj Please call patient and see if she is symptomatic with afib.  If not, continue routine follow-up. If symptomatic, please schedule follow-up with EP APP

## 2016-12-31 ENCOUNTER — Encounter: Payer: Self-pay | Admitting: Cardiology

## 2016-12-31 ENCOUNTER — Ambulatory Visit (INDEPENDENT_AMBULATORY_CARE_PROVIDER_SITE_OTHER): Payer: Medicare Other | Admitting: Cardiology

## 2016-12-31 VITALS — BP 124/64 | HR 68 | Ht 63.0 in | Wt 164.0 lb

## 2016-12-31 DIAGNOSIS — I2510 Atherosclerotic heart disease of native coronary artery without angina pectoris: Secondary | ICD-10-CM | POA: Diagnosis not present

## 2016-12-31 DIAGNOSIS — N183 Chronic kidney disease, stage 3 unspecified: Secondary | ICD-10-CM

## 2016-12-31 DIAGNOSIS — I5033 Acute on chronic diastolic (congestive) heart failure: Secondary | ICD-10-CM | POA: Diagnosis not present

## 2016-12-31 DIAGNOSIS — I10 Essential (primary) hypertension: Secondary | ICD-10-CM

## 2016-12-31 DIAGNOSIS — I110 Hypertensive heart disease with heart failure: Secondary | ICD-10-CM | POA: Diagnosis not present

## 2016-12-31 MED ORDER — METOLAZONE 2.5 MG PO TABS: 2.5000 mg | ORAL_TABLET | Freq: Every day | ORAL | 0 refills | Status: DC

## 2016-12-31 MED ORDER — FUROSEMIDE 40 MG PO TABS: 40.0000 mg | ORAL_TABLET | Freq: Two times a day (BID) | ORAL | 3 refills | Status: DC

## 2016-12-31 NOTE — Progress Notes (Signed)
Cardiology Office Note    Date:  12/31/2016  ID:  Jessica [name], DOB [birth year], MRN [id number] PCP:  Binnie Rail, MD  Cardiologist:  [name]   Chief Complaint: f/u edema  History of Present Illness:  Jessica [name] is a 83 y.o. female with history of CAD (DESx2 to mid and distal RCA in 2010), chronic diastolic CHF, prior noncompliance with fluid restriction, hypertensive heart disease, DM with peripheral neuropathy, HTN, HLD, SSS s/p Medtronic pacemaker 2011, PAF (discovered on device interrogation 10/2014), CKD stage III, moderate pulm HTN (by last echo) who presents for f/u of edema. She was last hospitalized for acute diastolic CHF in [id number] in the setting of accelerated HTN and PAF. She underwent DCCV at that time. Echo 06/2015: EF 55-60%, no RWMA, mild AI, mild-mod MR, mild LAE/RAE, mildly reduced RV function, mod TR, PASP 47mmHg. She was seen by Dr. [name] 09/03/16 at which time she had worsening LEE, crackles, increased abdominal girth and weight gain. Lasix was increased to 40mg  BID and HCTZ was stopped. Pacer interrogation at that time showed very low AF burden of 0.6%. She returned to clinic 09/18/16 - weight was only down 1 lb but LEE was improving. She continued to report drinking up to 4L of water per day however. Repeat BMET 09/18/16 showed BUN 34/Cr 1.42 (up from baseline around 1.1), CBC wnl, BNP 283. She was advised to decrease Lasix to 40mg  daily. As I suspected her BP would further increase with drop in diuretic dose, I pre-emptively increased hydralazine to 50mg  TID (BP was 186/94 at last OV).   She returns for follow-up today with her daughter. BP is running on the softer side. She brings a log of her BP and they are all in the A999333 range systolic now. She is finally drinking significantly less water. Her LEE has resolved. She still has some increased abdominal girth but in talking to her further, she greatly reduced her physical activity after a fall last year so there is some  suspected increase in body weight as well. No CP or SOB. Her daughter is concerned because she naps a lot during the day but she also reports inconsistent nighttime sleep, often going to bed very late. Last night she went to bed after 1am and woke up at 5:30am.  12/31/2016 - the patient is coming accompanied by her granddaughter, she states that her LE edema got worse again. At the last visit we have cut her afternoon lasix as her LE edema has resolved. She denies PND. She can walk short distances with a walker. No falls. No chest pain.    Past Medical History:  Diagnosis Date  . Breast cancer ([place])   . CAD (coronary artery disease)    a. s/p DESx2 to mid and distal RCA in 2010.  Marland Kitchen Cholelithiasis 09/12/2013   Lap Chole on 09/14/13   . Chronic diastolic CHF (congestive heart failure) ([place])    a. Echo 06/09/15: EF 55-60%, normal wall motion, mild AI, mild to moderate MR, mild LAE, mildly reduced RVSF, mild RAE, moderate TR, PASP 53 mmHg  . CKD (chronic kidney disease), stage III   . Diabetes mellitus    NON INSULIN DEPENDENT  . DJD (degenerative joint disease)   . GERD (gastroesophageal reflux disease)   . Hemorrhoids   . History of diabetic neuropathy   . Hypercholesterolemia   . Hypertension   . Hypertensive heart disease   . Paroxysmal atrial fibrillation ([facility])    a. discovered on  PPM interrogation 12/15, CHAD2VASC score is 6. b. decompensation with dCHF in 06/2015 possibly due to AF, s/p DCCV.  . SSS (sick sinus syndrome) ([place])    a. s/p Medtronic PPM by JA for SSS and syncope 9/11.    Past Surgical History:  Procedure Laterality Date  . CARDIAC CATHETERIZATION  07/05/2009   EF 55-60%  . CARDIOVERSION N/A 06/11/2015   Procedure: CARDIOVERSION;  Surgeon: [name], MD;  Location: [facility] ENDOSCOPY;  Service: Cardiovascular;  Laterality: N/A;  . CHOLECYSTECTOMY N/A 09/14/2013   Procedure: LAPAROSCOPIC CHOLECYSTECTOMY WITH INTRAOPERATIVE CHOLANGIOGRAM;  Surgeon: [name], MD;   Location: [facility];  Service: General;  Laterality: N/A;  . COLONOSCOPY    . ERCP N/A 09/13/2013   Procedure: ENDOSCOPIC RETROGRADE CHOLANGIOPANCREATOGRAPHY (ERCP);  Surgeon: [name], MD;  Location[facility] ENDOSCOPY;  Service: Endoscopy;  Laterality: N/A;  . INSERT / REPLACE / REMOVE PACEMAKER  9/11   SSS and syncope, implanted by JA (MDT)  . MASTECTOMY  1992   BILATERAL WITH RECONSTRUCTION    Current Medications: Current Outpatient Prescriptions  Medication Sig Dispense Refill  . apixaban (ELIQUIS) 5 MG TABS tablet Take 5 mg by mouth 2 (two) times daily.    Marland Kitchen b complex vitamins tablet Take 1 tablet by mouth daily.    . calcium carbonate (OS-CAL) 600 MG TABS tablet Take 600 mg by mouth daily with breakfast.     . carvedilol (COREG) 25 MG tablet TAKE ONE TABLET BY MOUTH TWICE DAILY. 60 tablet 2  . Cyanocobalamin (VITAMIN B12 PO) Take 1 tablet by mouth daily.     . fish oil-omega-3 fatty acids 1000 MG capsule Take 1 g by mouth every morning.     . furosemide (LASIX) 40 MG tablet Take 1 tablet (40 mg total) by mouth 2 (two) times daily. (Patient taking differently: Take 40 mg by mouth daily per phone instructions. ) 180 tablet 1  . gabapentin (NEURONTIN) 100 MG capsule Take 1 capsule (100 mg total) by mouth at bedtime. Take in addition to the 300 mg cap you are already taking at night 90 capsule 3  . gabapentin (NEURONTIN) 300 MG capsule TAKE ONE CAPSULE BY MOUTH TWICE DAILY 60 capsule 5  . hydrALAZINE (APRESOLINE) 25 MG tablet TAKE ONE TABLET BY MOUTH EVERY 8 HOURS - (taking 2 tabs q8hr per recent phone instructions) 270 tablet 2  . lisinopril (PRINIVIL,ZESTRIL) 20 MG tablet Take 1 tablet (20 mg total) by mouth 2 (two) times daily. 180 tablet 3  . metFORMIN (GLUCOPHAGE) 500 MG tablet TAKE ONE TABLET BY MOUTH ONCE DAILY WITH BREAKFAST 90 tablet 3  . nitroGLYCERIN (NITROSTAT) 0.4 MG SL tablet Place 1 tablet (0.4 mg total) under the tongue every 5 (five) minutes as needed for chest pain. x3 doses  as needed for chest pain 25 tablet prn  . polyethylene glycol (MIRALAX / GLYCOLAX) packet Take 17 g by mouth daily as needed for moderate constipation.    . rosuvastatin (CRESTOR) 10 MG tablet TAKE ONE TABLET BY MOUTH ONCE DAILY IN THE MORNING 90 tablet 3   No current facility-administered medications for this visit.      Allergies:   Lyrica [pregabalin]; Amlodipine; and Sulfonamide derivatives   Social History   Social History  . Marital status: Widowed    Spouse name: N/A  . Number of children: N/A  . Years of education: N/A   Social History Main Topics  . Smoking status: Never Smoker  . Smokeless tobacco: Never Used  .  Alcohol use No  . Drug use: No  . Sexual activity: Not Asked   Other Topics Concern  . None   Social History Narrative  . None     Family History:  The patient's family history includes Cancer in her father and mother.   ROS:   Please see the history of present illness. Otherwise, review of systems is positive for insomnia. All other systems are reviewed and otherwise negative.    PHYSICAL EXAM:   VS:  BP 124/64   Pulse 68   Ht 5\' 3"  (1.6 m)   Wt 164 lb (74.4 kg)   SpO2 98%   BMI 29.05 kg/m   BMI: Body mass index is 29.05 kg/m. GEN: Well nourished, well developed obese WF in no acute distress  HEENT: normocephalic, atraumatic Neck: no JVD, carotid bruits, or masses Cardiac: RRR; no murmurs, rubs, or gallops, mild B/L LE edema, varicose veins noted Respiratory: crakles at bases bilaterally, normal work of breathing GI: rounded but soft, nontender, + BS MS: no deformity or atrophy  Skin: warm and dry, no rash Neuro:  Alert and Oriented x 3, Strength and sensation are intact, follows commands Psych: euthymic mood, full affect  Wt Readings from Last 3 Encounters:  12/31/16 164 lb (74.4 kg)  10/21/16 171 lb (77.6 kg)  10/01/16 169 lb 4 oz (76.8 kg)      Studies/Labs Reviewed:   EKG:  EKG was not ordered today.  Recent Labs: 04/01/2016:  TSH 2.95 09/18/2016: Brain Natriuretic Peptide 283.0; Hemoglobin 11.8; Platelets 296 10/01/2016: ALT 10 10/07/2016: BUN 23; Creat 1.33; Potassium 4.0; Sodium 144   Lipid Panel    Component Value Date/Time   CHOL 101 06/09/2015 0330   TRIG 52 06/09/2015 0330   HDL 38 (L) 06/09/2015 0330   CHOLHDL 2.7 06/09/2015 0330   VLDL 10 06/09/2015 0330   LDLCALC 53 06/09/2015 0330   LDLDIRECT 152.0 10/10/2013 1619    Additional studies/ records that were reviewed today include: Summarized above.    ASSESSMENT & PLAN:   1. Acute on chronic diastolic/ Hypertensive heart disease - her edema has worsened again as well as shortness of breath. Today's labs showed Crea 1.33 -> 1.62, and BNP is 2200 consistent with worsening CHF. We will increase lasix to 40 mg po BID and add metolazone 2.5 mg po daily x 2 weeks. We will follow up in 2 weeks with repeat BMP and BNP.  She is down 4 lbs, however fluid overloaded on physical exam.    2. CKD stage III - as above 3. HTN - controlled. 4. Paroxysmal atrial fib - low burden by recent interrogation of pacemaker. Continue Eliquis. Need to watch Cr carefully because if this goes above 1.5, will need to reduce Eliquis dose. 5. CAD- she is not describing any recurrent symptoms of angina. Continue BB, statin. Not on ASA due to ongoing Eliquis use.  Disposition: F/u with Dr. [name] in 3 months. Also instructed to f/u with PCP given her abdominal distention, insomnia and nondescript fatigue. We reviewed importance of sleep hygiene.   Medication Adjustments/Labs and Tests Ordered: Current medicines are reviewed at length with the patient today.  Concerns regarding medicines are outlined above. Medication changes, Labs and Tests ordered today are summarized above and listed in the Patient Instructions accessible in Encounters.   Signed, Ena Dawley, MD 12/31/2016 12:11 PM    [facility] [address], [place], [place]  [address] Phone:  [phone]; Fax: [phone]

## 2016-12-31 NOTE — Patient Instructions (Signed)
Medication Instructions:   TAKE LASIX 40 MG TWICE DAILY--TAKE ONE DOSE AT 8 AM AND THE 2ND DOSE AT 2 PM  TAKE METOLAZONE 2.5 MG BY MOUTH DAILY FOR 2 WEEKS ONLY     Labwork:  TODAY--BMET AND PRO-BNP      Follow-Up:  2 WEEKS WITH AN EXTENDER IN OUR OFFICE---DR NELSON PREFERS YOU SEE DAYNA DUNN PA-C IF SHE IS AVAILABLE AT THAT TIME, FOR SHE HAS SEEN YOU IN THE PAST       If you need a refill on your cardiac medications before your next appointment, please call your pharmacy.

## 2017-01-01 ENCOUNTER — Telehealth: Payer: Self-pay | Admitting: *Deleted

## 2017-01-01 DIAGNOSIS — I110 Hypertensive heart disease with heart failure: Secondary | ICD-10-CM

## 2017-01-01 DIAGNOSIS — I5033 Acute on chronic diastolic (congestive) heart failure: Secondary | ICD-10-CM

## 2017-01-01 DIAGNOSIS — N183 Chronic kidney disease, stage 3 unspecified: Secondary | ICD-10-CM

## 2017-01-01 LAB — BASIC METABOLIC PANEL
BUN/Creatinine Ratio: 17 (ref 12–28)
BUN: 28 mg/dL — ABNORMAL HIGH (ref 8–27)
CO2: 25 mmol/L (ref 18–29)
Calcium: 9.8 mg/dL (ref 8.7–10.3)
Chloride: 102 mmol/L (ref 96–106)
Creatinine, Ser: 1.62 mg/dL — ABNORMAL HIGH (ref 0.57–1.00)
GFR calc Af Amer: 33 mL/min/{1.73_m2} — ABNORMAL LOW (ref >59)
GFR calc non Af Amer: 28 mL/min/{1.73_m2} — ABNORMAL LOW (ref >59)
Glucose: 113 mg/dL — ABNORMAL HIGH (ref 65–99)
Potassium: 4.1 mmol/L (ref 3.5–5.2)
Sodium: 146 mmol/L — ABNORMAL HIGH (ref 134–144)

## 2017-01-01 LAB — PRO B NATRIURETIC PEPTIDE: NT-Pro BNP: 2255 pg/mL — ABNORMAL HIGH (ref 0–738)

## 2017-01-01 NOTE — Telephone Encounter (Signed)
-----   Message from [name], MD sent at 01/01/2017  1:30 PM EST ----- Continue the management as advised yesterday, repeat BMP and BNP in 3 weeks.

## 2017-01-01 NOTE — Telephone Encounter (Signed)
Notified the pt and daughter that per Dr [name], based on her labs from yesterday, she should continue management as advised in the office yesterday, and come back for repeat bmet and bnp x 3 weeks.  Scheduled the pt a lab appt for 01/21/17 to recheck a bmet and bnp.  Both parties verbalized understanding and agrees with this plan.

## 2017-01-15 NOTE — Progress Notes (Signed)
Cardiology Office Note    Date:  01/16/2017  ID:  Jessica [name], DOB 05/10/1927, MRN [id number] PCP:  Binnie Rail, MD  Cardiologist:  Dr. [name]   Chief Complaint: f/u CHF  History of Present Illness:  Jessica [name] is a 83 y.o. female with history of CAD (DESx2 to mid and distal RCA in 2010), chronic diastolic CHF, prior noncompliance with fluid restriction, hypertensive heart disease, DM with peripheral neuropathy, HTN, HLD, SSS s/p Medtronic pacemaker 2011, PAF (discovered on device interrogation 10/2014), CKD stage III, moderate pulm HTN (by last echo) who presents for f/u of edema. She was last hospitalized for acute diastolic CHF in 04/8371 in the setting of accelerated HTN and PAF. She underwent DCCV at that time. Echo 06/2015: EF 55-60%, no RWMA, mild AI, mild-mod MR, mild LAE/RAE, mildly reduced RV function, mod TR, PASP 93mHg.   She was managed at the end of 2017 with worsening CHF in the setting of repeated noncompliance with fluid restriction (up to 4L of water per day). Pacer interrogation at that time showed very low AF burden of 0.6%. Her medication were adjusted.   In 2018, her device interrogation 12/03/16 showed increased spike in atrial fib burden to 54.8%. She saw Dr. NMeda Coffeein f/u 12/31/16 reporting worsening edema again. Labs showed 1.33 -> 1.62, and BNP was 2200 consistent with worsening CHF. Dr. NMeda Coffeerecommended to increase lasix to 40 mg po BID and add metolazone 2.5 mg po daily x 2 weeks.  She returns for follow-up today - finished metolazone on Wednesday. Her daughter THelene Kelpreports she's really been "zapped" - sleeping more, intermittent muscle jerking, trouble focusing. No focal stroke sx. Reports +++ UOP while on metolazone. Edema has resolved completely - have never seen it this improved before. Her daughter is concerned because they have frequently been getting low BP readings of 890-211systolic. On one occasion the monitor read 59/44. She has not had any chest  pain or palpitations. She complains of diffuse neck/back stiffness which has been chronic. In talking to her daughter, it sounds like she's generally had a rough few years.  Past Medical History:  Diagnosis Date  . Breast cancer ([facility]   . CAD (coronary artery disease)    a. s/p DESx2 to mid and distal RCA in 2010.  .Marland KitchenCholelithiasis 09/12/2013   Lap Chole on 09/14/13   . Chronic diastolic CHF (congestive heart failure) ([facility]    a. Echo 06/09/15: EF 55-60%, normal wall motion, mild AI, mild to moderate MR, mild LAE, mildly reduced RVSF, mild RAE, moderate TR, PASP 53 mmHg  . CKD (chronic kidney disease), stage III   . Diabetes mellitus    NON INSULIN DEPENDENT  . DJD (degenerative joint disease)   . GERD (gastroesophageal reflux disease)   . Hemorrhoids   . History of diabetic neuropathy   . Hypercholesterolemia   . Hypertension   . Hypertensive heart disease   . Paroxysmal atrial fibrillation (HCC)    a. discovered on PPM interrogation 12/15, CHAD2VASC score is 6. b. decompensation with dCHF in 06/2015 possibly due to AF, s/p DCCV.  . SSS (sick sinus syndrome) ([facility]    a. s/p Medtronic PPM by JA for SSS and syncope 9/11.    Past Surgical History:  Procedure Laterality Date  . CARDIAC CATHETERIZATION  07/05/2009   EF 55-60%  . CARDIOVERSION N/A 06/11/2015   Procedure: CARDIOVERSION;  Surgeon: KDorothy Spark MD;  Location[place] [facility]  Service: Cardiovascular;  Laterality: N/A;  .  CHOLECYSTECTOMY N/A 09/14/2013   Procedure: LAPAROSCOPIC CHOLECYSTECTOMY WITH INTRAOPERATIVE CHOLANGIOGRAM;  Surgeon: [name], MD;  Location: [facility];  Service: General;  Laterality: N/A;  . COLONOSCOPY    . ERCP N/A 09/13/2013   Procedure: ENDOSCOPIC RETROGRADE CHOLANGIOPANCREATOGRAPHY (ERCP);  Surgeon: [name], MD;  Location: [facility] ENDOSCOPY;  Service: Endoscopy;  Laterality: N/A;  . INSERT / REPLACE / REMOVE PACEMAKER  9/11   SSS and syncope, implanted by JA (MDT)  . MASTECTOMY  1992    BILATERAL WITH RECONSTRUCTION    Current Medications: Current Outpatient Prescriptions  Medication Sig Dispense Refill  . apixaban (ELIQUIS) 5 MG TABS tablet Take 1 tablet (5 mg total) by mouth 2 (two) times daily. 60 tablet 2  . b complex vitamins tablet Take 1 tablet by mouth daily.    . blood glucose meter kit and supplies KIT Dispense based on patient and insurance preference. Test sugars once daily and prn as directed. (FOR ICD-9 250.00, 250.01). 1 each 0  . carvedilol (COREG) 25 MG tablet TAKE ONE TABLET BY MOUTH TWICE DAILY 180 tablet 3  . ECHINACEA PO Take 750 mg by mouth daily.    . fish oil-omega-3 fatty acids 1000 MG capsule Take 1 g by mouth every morning.     . furosemide (LASIX) 40 MG tablet Take 1 tablet (40 mg total) by mouth 2 (two) times daily. Take 1 dose at 8 am and the 2nd dose at 2 pm 90 tablet 3  . gabapentin (NEURONTIN) 100 MG capsule Take 1 capsule (100 mg total) by mouth at bedtime. Take in addition to the 300 mg cap you are already taking at night 90 capsule 3  . gabapentin (NEURONTIN) 300 MG capsule TAKE ONE CAPSULE BY MOUTH TWICE DAILY 60 capsule 5  . hydrALAZINE (APRESOLINE) 25 MG tablet TAKE ONE TABLET BY MOUTH EVERY 8 HOURS 270 tablet 2  . lisinopril (PRINIVIL,ZESTRIL) 20 MG tablet Take 1 tablet (20 mg total) by mouth 2 (two) times daily. 180 tablet 3  . metFORMIN (GLUCOPHAGE) 500 MG tablet TAKE ONE TABLET BY MOUTH ONCE DAILY WITH BREAKFAST 90 tablet 3  . Misc. Devices (ROLLATOR ULTRA-LIGHT) MISC With seat.  Diabetic neuropathy, knee arthritis 1 each 0  . nitroGLYCERIN (NITROSTAT) 0.4 MG SL tablet Place 1 tablet (0.4 mg total) under the tongue every 5 (five) minutes as needed for chest pain. x3 doses as needed for chest pain 25 tablet 0  . rosuvastatin (CRESTOR) 10 MG tablet TAKE ONE TABLET BY MOUTH ONCE DAILY IN THE MORNING 90 tablet 3   No current facility-administered medications for this visit.      Allergies:   Lyrica [pregabalin]; Amlodipine; and  Sulfonamide derivatives   Social History   Social History  . Marital status: Widowed    Spouse name: N/A  . Number of children: N/A  . Years of education: N/A   Social History Main Topics  . Smoking status: Never Smoker  . Smokeless tobacco: Never Used  . Alcohol use No  . Drug use: No  . Sexual activity: Not Asked   Other Topics Concern  . None   Social History Narrative  . None     Family History:  Family History  Problem Relation Age of Onset  . Cancer Mother   . Cancer Father     ROS:   Please see the history of present illness.  All other systems are reviewed and otherwise negative.    PHYSICAL EXAM:   VS:  BP Marland Kitchen)  100/42   Pulse 67   Ht 5' 3"  (1.6 m)   Wt 163 lb 1.9 oz (74 kg)   BMI 28.90 kg/m   BMI: Body mass index is 28.9 kg/m. GEN: Well nourished, well developed overweight WF in no acute distress  HEENT: normocephalic, atraumatic Neck: no JVD, carotid bruits, or masses Cardiac: irregular, rate controlled; no murmurs, rubs, or gallops, no edema  Respiratory:  clear to auscultation bilaterally, normal work of breathing GI: soft, nontender, nondistended, + BS MS: kyphotic posture Skin: warm and dry, no rash Neuro:  A+Ox 3 but hard of hearing, follows commands Psych: euthymic mood, full affect  Wt Readings from Last 3 Encounters:  01/16/17 163 lb 1.9 oz (74 kg)  12/31/16 164 lb (74.4 kg)  10/21/16 171 lb (77.6 kg)      Studies/Labs Reviewed:   EKG:  EKG was ordered today and personally reviewed by me and demonstrates suspected atrial fib with occasional V pacing, 67bpm, nonspecific St-T Changes.  Recent Labs: 04/01/2016: TSH 2.95 09/18/2016: Brain Natriuretic Peptide 283.0; Hemoglobin 11.8; Platelets 296 10/01/2016: ALT 10 12/31/2016: BUN 28; Creatinine, Ser 1.62; NT-Pro BNP 2,255; Potassium 4.1; Sodium 146   Lipid Panel    Component Value Date/Time   CHOL 101 06/09/2015 0330   TRIG 52 06/09/2015 0330   HDL 38 (L) 06/09/2015 0330    CHOLHDL 2.7 06/09/2015 0330   VLDL 10 06/09/2015 0330   LDLCALC 53 06/09/2015 0330   LDLDIRECT 152.0 10/10/2013 1619    Additional studies/ records that were reviewed today include: Summarized above.    ASSESSMENT & PLAN:   1. Acute on chronic diastolic CHF - her edema has completely resolved; I've actually never seen it this good. However, given her hypotension, fatigue and "muscle jerking," I am concerned for volume depletion and electrolyte deficiency possibly related to the metolazone. Will check stat labs to help guide further recs. Her symptoms could also be due to hypotension. I asked her daughter to hold off on PM BP medications until we get her labs back pending further review. See below re: hydralazine. 2. CKD stage III - f/u BMET stat today - did not want this to wait over the weekend. 3. Paroxysmal atrial fib - higher burden by PPM interrogation. If above labs are stable will refer back to EP as recommended in device clinic note for further management. If Cr remains above 1.5, would need to consider switch to lower dose Eliquis. Check CBC given hypotension. 4. HTN - BP running low. I have recommended to d/c standing hydralazine and only take PRN SBP >160. Asked daughter to hold other BP meds for this afternoon pending my review of her labs. 5. CAD - no recent symptoms to suggest progressive angina. Not on ASA due to need for anticoagulation.  Disposition: F/u to be determined based on above.   Medication Adjustments/Labs and Tests Ordered: Current medicines are reviewed at length with the patient today.  Concerns regarding medicines are outlined above. Medication changes, Labs and Tests ordered today are summarized above and listed in the Patient Instructions accessible in Encounters.   Raechel Ache PA-C  01/16/2017 11:30 AM    [facility] [address], [place], [place]  [address] Phone: [phone]; Fax: [phone]

## 2017-01-16 ENCOUNTER — Ambulatory Visit (INDEPENDENT_AMBULATORY_CARE_PROVIDER_SITE_OTHER): Payer: Medicare Other | Admitting: Physician Assistant

## 2017-01-16 ENCOUNTER — Encounter (INDEPENDENT_AMBULATORY_CARE_PROVIDER_SITE_OTHER): Payer: Self-pay

## 2017-01-16 ENCOUNTER — Encounter: Payer: Self-pay | Admitting: Physician Assistant

## 2017-01-16 ENCOUNTER — Telehealth: Payer: Self-pay | Admitting: Cardiology

## 2017-01-16 ENCOUNTER — Telehealth: Payer: Self-pay | Admitting: *Deleted

## 2017-01-16 ENCOUNTER — Telehealth: Payer: Self-pay

## 2017-01-16 VITALS — BP 100/42 | HR 67 | Ht 63.0 in | Wt 163.1 lb

## 2017-01-16 DIAGNOSIS — E860 Dehydration: Secondary | ICD-10-CM

## 2017-01-16 DIAGNOSIS — N183 Chronic kidney disease, stage 3 unspecified: Secondary | ICD-10-CM

## 2017-01-16 DIAGNOSIS — I5033 Acute on chronic diastolic (congestive) heart failure: Secondary | ICD-10-CM

## 2017-01-16 DIAGNOSIS — I10 Essential (primary) hypertension: Secondary | ICD-10-CM | POA: Diagnosis not present

## 2017-01-16 DIAGNOSIS — I480 Paroxysmal atrial fibrillation: Secondary | ICD-10-CM

## 2017-01-16 DIAGNOSIS — I2510 Atherosclerotic heart disease of native coronary artery without angina pectoris: Secondary | ICD-10-CM | POA: Diagnosis not present

## 2017-01-16 LAB — CBC
HEMOGLOBIN: 11.8 g/dL (ref 11.1–15.9)
Hematocrit: 34.5 % (ref 34.0–46.6)
MCH: 29.2 pg (ref 26.6–33.0)
MCHC: 34.2 g/dL (ref 31.5–35.7)
MCV: 85 fL (ref 79–97)
Platelets: 222 10*3/uL (ref 150–379)
RBC: 4.04 x10E6/uL (ref 3.77–5.28)
RDW: 15.7 % — ABNORMAL HIGH (ref 12.3–15.4)
WBC: 8.3 10*3/uL (ref 3.4–10.8)

## 2017-01-16 LAB — BASIC METABOLIC PANEL
BUN/Creatinine Ratio: 34 — ABNORMAL HIGH (ref 12–28)
BUN: 78 mg/dL — AB (ref 8–27)
CALCIUM: 10 mg/dL (ref 8.7–10.3)
CHLORIDE: 99 mmol/L (ref 96–106)
CO2: 31 mmol/L — ABNORMAL HIGH (ref 18–29)
Creatinine, Ser: 2.31 mg/dL — ABNORMAL HIGH (ref 0.57–1.00)
GFR calc Af Amer: 21 mL/min/{1.73_m2} — ABNORMAL LOW (ref >59)
GFR calc non Af Amer: 18 mL/min/{1.73_m2} — ABNORMAL LOW (ref >59)
GLUCOSE: 160 mg/dL — AB (ref 65–99)
Potassium: 3.4 mmol/L — ABNORMAL LOW (ref 3.5–5.2)
Sodium: 138 mmol/L (ref 134–144)

## 2017-01-16 LAB — MAGNESIUM: MAGNESIUM: 2.2 mg/dL (ref 1.6–2.3)

## 2017-01-16 MED ORDER — APIXABAN 2.5 MG PO TABS: 2.5000 mg | ORAL_TABLET | Freq: Two times a day (BID) | ORAL | 3 refills | Status: DC

## 2017-01-16 NOTE — Telephone Encounter (Signed)
BMET order placed STAT for Monday 01/19/2017 per Eugenia Mcalpine PA-C

## 2017-01-16 NOTE — Telephone Encounter (Signed)
Called patient's daughter and left message that she will need to decrease her mom's Eliquis to 2.5 mg bid.  New rx sent into pharmacy.

## 2017-01-16 NOTE — Patient Instructions (Signed)
Medication Instructions:  Your physician has recommended you make the following change in your medication: Please hold on taking the hydrazaline for now, only take if the top blood pressure number is above 160, also hold off on the nightly medications as well.   Labwork: Your physician recommends that you return for lab work today: BMET, MAGNESIUM, CBC, BNP.  Testing/Procedures: None ordered   Follow-Up: Your physician recommends that you schedule a follow-up appointment: once we receive the results from the lab work.  Any Other Special Instructions Will Be Listed Below (If Applicable).     If you need a refill on your cardiac medications before your next appointment, please call your pharmacy.

## 2017-01-16 NOTE — Progress Notes (Signed)
Please call patient's daughter Jessica [name]. Labs do confirm she is dehydrated as suspected with evidence of kidney insufficiency - elevated BUN and creatinine. Potassium is also low.  I discussed with DOD Dr. Burt Knack. Recommend to hold Lasix through the weekend and recheck BMET Monday morning (please order as stat). Decrease lisinopril to 20mg  once a day. Only use hydralazine as needed for systolic blood pressure >791 as we talked about. On top of the BMET on Monday, would advise an office visit at the end of next week to further recheck BP and edema and decide about a regimen going forward. It would be ideal if this is with me or Dr. [name]. This is appropriate for transition of care status. Lavona Norsworthy PA-C

## 2017-01-16 NOTE — Telephone Encounter (Signed)
Pt's daughter [name] called to review med changes from today. Clinic noted reviewed.  Pt had increase in SCr. Will hold lasix through weekend and repeat Bmet on Monday. Hold Hydralazine unless SBP>160. Reduce lisinopril to 20 mg daily. BP was low today in office. Will hold coreg for tonight and resume in am.  She will also decrease Eliquis to 2.5 mg.  Daughter verbalized understanding. Read instructions back to me.  She will monitor BP at home.   Daune Perch, NP

## 2017-01-17 LAB — PRO B NATRIURETIC PEPTIDE: NT-Pro BNP: 3150 pg/mL — ABNORMAL HIGH (ref 0–738)

## 2017-01-19 ENCOUNTER — Telehealth: Payer: Self-pay | Admitting: Cardiology

## 2017-01-19 ENCOUNTER — Other Ambulatory Visit: Payer: Medicare Other | Admitting: *Deleted

## 2017-01-19 DIAGNOSIS — E7800 Pure hypercholesterolemia, unspecified: Secondary | ICD-10-CM

## 2017-01-19 DIAGNOSIS — I209 Angina pectoris, unspecified: Secondary | ICD-10-CM

## 2017-01-19 DIAGNOSIS — I110 Hypertensive heart disease with heart failure: Secondary | ICD-10-CM

## 2017-01-19 NOTE — Telephone Encounter (Signed)
For some reason the labs didn't come to my box, but came to Dr. Francesca Oman box. I have reviewed. Please call daughter [name]. Creatinine is slowly improving but BUN/Cr still elevated above baseline.  I would recommend to continue carvedilol at present dose along with decreased dose of lisinopril 20mg  daily.  If BP is still running <093 systolic would hold lisinopril as well. Continue to hold furosemide and metolazone. If edema begins to come back please call office - but needs f/u appt in the next 2 days (TOC appropriate) - anyone who has an opening if the folks on Dr. Francesca Oman care team (including myself) do not.   Keep hydralazine as PRN only from here on out (PRN SBP>160).  Dayna Dunn PA-C

## 2017-01-19 NOTE — Telephone Encounter (Signed)
Patient daughter(Teresa) is calling to see if lab results are available and also has some questions about patient medication.   [name] states that she was instructed to suspend the carvedilol, Lasix and hydralazine from Friday to Sunday. She would like to know what dosage patient should take now that the weekend has passed. Thanks.

## 2017-01-19 NOTE — Addendum Note (Signed)
Addended by: Eulis Foster on: 01/19/2017 09:46 AM   Modules accepted: Orders

## 2017-01-19 NOTE — Telephone Encounter (Signed)
I spoke with pt's daughter and gave her instructions from [name], [place].  I scheduled pt to see Dr. [name] tomorrow at 2:45

## 2017-01-20 ENCOUNTER — Ambulatory Visit (INDEPENDENT_AMBULATORY_CARE_PROVIDER_SITE_OTHER): Payer: Medicare Other | Admitting: Cardiology

## 2017-01-20 ENCOUNTER — Telehealth: Payer: Self-pay

## 2017-01-20 ENCOUNTER — Encounter: Payer: Self-pay | Admitting: Cardiology

## 2017-01-20 VITALS — BP 124/68 | HR 84 | Ht 64.0 in | Wt 165.0 lb

## 2017-01-20 DIAGNOSIS — I2510 Atherosclerotic heart disease of native coronary artery without angina pectoris: Secondary | ICD-10-CM | POA: Diagnosis not present

## 2017-01-20 DIAGNOSIS — I5033 Acute on chronic diastolic (congestive) heart failure: Secondary | ICD-10-CM

## 2017-01-20 DIAGNOSIS — N189 Chronic kidney disease, unspecified: Secondary | ICD-10-CM | POA: Diagnosis not present

## 2017-01-20 DIAGNOSIS — I110 Hypertensive heart disease with heart failure: Secondary | ICD-10-CM | POA: Diagnosis not present

## 2017-01-20 DIAGNOSIS — N179 Acute kidney failure, unspecified: Secondary | ICD-10-CM

## 2017-01-20 DIAGNOSIS — E876 Hypokalemia: Secondary | ICD-10-CM

## 2017-01-20 DIAGNOSIS — I480 Paroxysmal atrial fibrillation: Secondary | ICD-10-CM

## 2017-01-20 LAB — BASIC METABOLIC PANEL
BUN/Creatinine Ratio: 33 — ABNORMAL HIGH (ref 12–28)
BUN: 63 mg/dL — ABNORMAL HIGH (ref 8–27)
CO2: 27 mmol/L (ref 18–29)
Calcium: 10.3 mg/dL (ref 8.7–10.3)
Chloride: 100 mmol/L (ref 96–106)
Creatinine, Ser: 1.92 mg/dL — ABNORMAL HIGH (ref 0.57–1.00)
GFR calc Af Amer: 27 mL/min/{1.73_m2} — ABNORMAL LOW (ref >59)
GFR calc non Af Amer: 23 mL/min/{1.73_m2} — ABNORMAL LOW (ref >59)
Glucose: 129 mg/dL — ABNORMAL HIGH (ref 65–99)
Potassium: 4.3 mmol/L (ref 3.5–5.2)
Sodium: 143 mmol/L (ref 134–144)

## 2017-01-20 LAB — PRO B NATRIURETIC PEPTIDE: NT-Pro BNP: 4535 pg/mL — ABNORMAL HIGH (ref 0–738)

## 2017-01-20 MED ORDER — LISINOPRIL 10 MG PO TABS: 10.0000 mg | ORAL_TABLET | Freq: Every day | ORAL | 2 refills | Status: DC

## 2017-01-20 MED ORDER — HYDRALAZINE HCL 25 MG PO TABS: 25.0000 mg | ORAL_TABLET | Freq: Two times a day (BID) | ORAL | 3 refills | Status: DC | PRN

## 2017-01-20 NOTE — Patient Instructions (Signed)
Medication Instructions:   STOP TAKING LASIX NOW  DO NOT TAKE HYDRALAZINE ON A DAILY BASIS ANYMORE  YOU MAY TAKE HYDRALAZINE 25 MG BY MOUTH TWICE DAILY AS NEEDED FOR BP GREATER THAN 486 SYSTOLIC (THAT'S YOUR TOP NUMBER OF YOUR BLOOD PRESSURE)  DECREASE YOUR LISINOPRIL TO 10 MG ONCE DAILY     Labwork:  SAME DAY AS YOU SEE DR NELSON ON 02/11/17 TO CHECK---BMET AND PRO-BNP      Follow-Up:  DR [name] ON 02/11/17 AT ANY OPEN SLOT---YOU WILL NEED TO HAVE YOUR LAB APPOINTMENT SCHEDULED ON THIS DAY TOO       If you need a refill on your cardiac medications before your next appointment, please call your pharmacy.

## 2017-01-20 NOTE — Telephone Encounter (Signed)
Spoke with patients daughter Jessica [name] (per DPR) and she states that the came in for lab work and were informed about Jessica [name] reducing her mothers Eliquis dose to 2.5 mg BID. She says the already had the new RX called in and are aware of her mothers appt with Dr. [name] today. She was agreeable to current therapy.

## 2017-01-20 NOTE — Progress Notes (Signed)
Cardiology Office Note    Date:  01/20/2017  ID:  Jessica [name], DOB Nov 19, 1926, MRN [id number] PCP:  Binnie Rail, MD  Cardiologist:  [name]   Chief Complaint: muscle twitching  History of Present Illness:  Jessica [name] is a 83 y.o. female with history of CAD (DESx2 to mid and distal RCA in 2010), chronic diastolic CHF, prior noncompliance with fluid restriction, hypertensive heart disease, DM with peripheral neuropathy, HTN, HLD, SSS s/p Medtronic pacemaker 2011, PAF (discovered on device interrogation 10/2014), CKD stage III, moderate pulm HTN (by last echo) who presents for f/u of edema. She was last hospitalized for acute diastolic CHF in 03/3663 in the setting of accelerated HTN and PAF. She underwent DCCV at that time. Echo 06/2015: EF 55-60%, no RWMA, mild AI, mild-mod MR, mild LAE/RAE, mildly reduced RV function, mod TR, PASP 40mmHg. She was seen by Dr. [name] 09/03/16 at which time she had worsening LEE, crackles, increased abdominal girth and weight gain. Lasix was increased to 40mg  BID and HCTZ was stopped. Pacer interrogation at that time showed very low AF burden of 0.6%. She returned to clinic 09/18/16 - weight was only down 1 lb but LEE was improving. She continued to report drinking up to 4L of water per day however. Repeat BMET 09/18/16 showed BUN 34/Cr 1.42 (up from baseline around 1.1), CBC wnl, BNP 283. She was advised to decrease Lasix to 40mg  daily. As I suspected her BP would further increase with drop in diuretic dose, I pre-emptively increased hydralazine to 50mg  TID (BP was 186/94 at last OV).   She returns for follow-up today with her daughter. BP is running on the softer side. She brings a log of her BP and they are all in the 403-474 range systolic now. She is finally drinking significantly less water. Her LEE has resolved. She still has some increased abdominal girth but in talking to her further, she greatly reduced her physical activity after a fall last year so there  is some suspected increase in body weight as well. No CP or SOB. Her daughter is concerned because she naps a lot during the day but she also reports inconsistent nighttime sleep, often going to bed very late. Last night she went to bed after 1am and woke up at 5:30am.  12/31/2016 - the patient is coming accompanied by her granddaughter, she states that her LE edema got worse again. At the last visit we have cut her afternoon lasix as her LE edema has resolved. She denies PND. She can walk short distances with a walker. No falls. No chest pain.   01/20/2017 - the patient is coming after 4 days, at the last visit she has complained of involuntary muscle twitching and dizziness. She was found to be hypotensive, with hypokalemia and worsening Crea (acute on chronic kidney failure). She was advised to hold lasix, hydralazine, decrease lisinopril from 40 -> 20 mg po daily.  Today she reports improvement of both dizziness and muscle twitching. She has more energy. Her LE edema has resolved.   Past Medical History:  Diagnosis Date  . Breast cancer ([place])   . CAD (coronary artery disease)    a. s/p DESx2 to mid and distal RCA in 2010.  Marland Kitchen Cholelithiasis 09/12/2013   Lap Chole on 09/14/13   . Chronic diastolic CHF (congestive heart failure) ([place])    a. Echo 06/09/15: EF 55-60%, normal wall motion, mild AI, mild to moderate MR, mild LAE, mildly reduced RVSF, mild RAE,  moderate TR, PASP 53 mmHg  . CKD (chronic kidney disease), stage III   . Diabetes mellitus    NON INSULIN DEPENDENT  . DJD (degenerative joint disease)   . GERD (gastroesophageal reflux disease)   . Hemorrhoids   . History of diabetic neuropathy   . Hypercholesterolemia   . Hypertension   . Hypertensive heart disease   . Paroxysmal atrial fibrillation (HCC)    a. discovered on PPM interrogation 12/15, CHAD2VASC score is 6. b. decompensation with dCHF in 06/2015 possibly due to AF, s/p DCCV.  . SSS (sick sinus syndrome) ([place])    a. s/p  Medtronic PPM by JA for SSS and syncope 9/11.    Past Surgical History:  Procedure Laterality Date  . CARDIAC CATHETERIZATION  07/05/2009   EF 55-60%  . CARDIOVERSION N/A 06/11/2015   Procedure: CARDIOVERSION;  Surgeon: [name], MD;  Location: [facility] ENDOSCOPY;  Service: Cardiovascular;  Laterality: N/A;  . CHOLECYSTECTOMY N/A 09/14/2013   Procedure: LAPAROSCOPIC CHOLECYSTECTOMY WITH INTRAOPERATIVE CHOLANGIOGRAM;  Surgeon: [name], MD;  Location: [place];  Service: General;  Laterality: N/A;  . COLONOSCOPY    . ERCP N/A 09/13/2013   Procedure: ENDOSCOPIC RETROGRADE CHOLANGIOPANCREATOGRAPHY (ERCP);  Surgeon: [name], MD;  Location: [facility] ENDOSCOPY;  Service: Endoscopy;  Laterality: N/A;  . INSERT / REPLACE / REMOVE PACEMAKER  9/11   SSS and syncope, implanted by JA (MDT)  . MASTECTOMY  1992   BILATERAL WITH RECONSTRUCTION    Current Medications: Current Outpatient Prescriptions  Medication Sig Dispense Refill  . apixaban (ELIQUIS) 5 MG TABS tablet Take 5 mg by mouth 2 (two) times daily.    Marland Kitchen b complex vitamins tablet Take 1 tablet by mouth daily.    . calcium carbonate (OS-CAL) 600 MG TABS tablet Take 600 mg by mouth daily with breakfast.     . carvedilol (COREG) 25 MG tablet TAKE ONE TABLET BY MOUTH TWICE DAILY. 60 tablet 2  . Cyanocobalamin (VITAMIN B12 PO) Take 1 tablet by mouth daily.     . fish oil-omega-3 fatty acids 1000 MG capsule Take 1 g by mouth every morning.     . furosemide (LASIX) 40 MG tablet Take 1 tablet (40 mg total) by mouth 2 (two) times daily. (Patient taking differently: Take 40 mg by mouth daily per phone instructions. ) 180 tablet 1  . gabapentin (NEURONTIN) 100 MG capsule Take 1 capsule (100 mg total) by mouth at bedtime. Take in addition to the 300 mg cap you are already taking at night 90 capsule 3  . gabapentin (NEURONTIN) 300 MG capsule TAKE ONE CAPSULE BY MOUTH TWICE DAILY 60 capsule 5  . hydrALAZINE (APRESOLINE) 25 MG tablet TAKE ONE TABLET  BY MOUTH EVERY 8 HOURS - (taking 2 tabs q8hr per recent phone instructions) 270 tablet 2  . lisinopril (PRINIVIL,ZESTRIL) 20 MG tablet Take 1 tablet (20 mg total) by mouth 2 (two) times daily. 180 tablet 3  . metFORMIN (GLUCOPHAGE) 500 MG tablet TAKE ONE TABLET BY MOUTH ONCE DAILY WITH BREAKFAST 90 tablet 3  . nitroGLYCERIN (NITROSTAT) 0.4 MG SL tablet Place 1 tablet (0.4 mg total) under the tongue every 5 (five) minutes as needed for chest pain. x3 doses as needed for chest pain 25 tablet prn  . polyethylene glycol (MIRALAX / GLYCOLAX) packet Take 17 g by mouth daily as needed for moderate constipation.    . rosuvastatin (CRESTOR) 10 MG tablet TAKE ONE TABLET BY MOUTH ONCE DAILY IN THE MORNING 90 tablet  3   No current facility-administered medications for this visit.      Allergies:   Lyrica [pregabalin]; Amlodipine; and Sulfonamide derivatives   Social History   Social History  . Marital status: Widowed    Spouse name: N/A  . Number of children: N/A  . Years of education: N/A   Social History Main Topics  . Smoking status: Never Smoker  . Smokeless tobacco: Never Used  . Alcohol use No  . Drug use: No  . Sexual activity: Not Asked   Other Topics Concern  . None   Social History Narrative  . None     Family History:  The patient's family history includes Cancer in her father and mother.   ROS:   Please see the history of present illness. Otherwise, review of systems is positive for insomnia. All other systems are reviewed and otherwise negative.    PHYSICAL EXAM:   VS:  BP 124/68   Pulse 84   Ht 5\' 4"  (1.626 m)   Wt 165 lb (74.8 kg)   SpO2 95%   BMI 28.32 kg/m   BMI: Body mass index is 28.32 kg/m. GEN: Well nourished, well developed obese WF in no acute distress  HEENT: normocephalic, atraumatic Neck: no JVD, carotid bruits, or masses Cardiac: RRR; no murmurs, rubs, or gallops, no LE edema, varicose veins noted Respiratory: CTA bilaterally, normal work of  breathing GI: rounded but soft, nontender, + BS MS: no deformity or atrophy  Skin: warm and dry, no rash Neuro:  Alert and Oriented x 3, Strength and sensation are intact, follows commands Psych: euthymic mood, full affect  Wt Readings from Last 3 Encounters:  01/20/17 165 lb (74.8 kg)  01/16/17 163 lb 1.9 oz (74 kg)  12/31/16 164 lb (74.4 kg)      Studies/Labs Reviewed:   EKG:  EKG was not ordered today.  Recent Labs: 04/01/2016: TSH 2.95 09/18/2016: Brain Natriuretic Peptide 283.0; Hemoglobin 11.8 10/01/2016: ALT 10 01/16/2017: Magnesium 2.2; Platelets 222 01/19/2017: BUN 63; Creatinine, Ser 1.92; NT-Pro BNP 4,535; Potassium 4.3; Sodium 143   Lipid Panel    Component Value Date/Time   CHOL 101 06/09/2015 0330   TRIG 52 06/09/2015 0330   HDL 38 (L) 06/09/2015 0330   CHOLHDL 2.7 06/09/2015 0330   VLDL 10 06/09/2015 0330   LDLCALC 53 06/09/2015 0330   LDLDIRECT 152.0 10/10/2013 1619    Additional studies/ records that were reviewed today include: Summarized above.    ASSESSMENT & PLAN:   1. Acute on chronic diastolic/ Hypertensive heart disease  - she appears euvolemic, no LE edema, lungs are CTA - Crea 1.3 -> 1.6 -> 2.3 -> 1.9 after holding lasix for 4 days. - hypokalemia has resolve - we will continue to hold lasix for 3 more weeks - hold hydralazine - decrease lisinopril 20 -> 10 mg po daily - for BP > 150 mmHg take hydralazine 25 mg po BID - follow up with me on 02/11/2017 with BNP and BMP   2. CKD stage III - as above  3. HTN - as above  4. Paroxysmal atrial fib - low burden by recent interrogation of pacemaker. Continue Eliquis - the dose was decreased with decreasing GFR  5. CAD- she is not describing any recurrent symptoms of angina. Continue BB, statin. Not on ASA due to ongoing Eliquis use.  Disposition: F/u with Dr. [name] in 3 weeks with labs.  Medication Adjustments/Labs and Tests Ordered: Current medicines are reviewed at length with the  patient today.  Concerns regarding medicines are outlined above. Medication changes, Labs and Tests ordered today are summarized above and listed in the Patient Instructions accessible in Encounters.   Signed, Ena Dawley, MD 01/20/2017 10:19 PM    [address], [place], [place]  [address] Phone: [phone]; Fax: [phone]

## 2017-01-21 ENCOUNTER — Other Ambulatory Visit: Payer: Medicare Other

## 2017-01-21 ENCOUNTER — Ambulatory Visit: Payer: Medicare Other | Admitting: Podiatry

## 2017-01-22 ENCOUNTER — Encounter: Payer: Self-pay | Admitting: Podiatrist

## 2017-01-22 ENCOUNTER — Ambulatory Visit (INDEPENDENT_AMBULATORY_CARE_PROVIDER_SITE_OTHER): Payer: Self-pay | Admitting: Podiatrist

## 2017-01-22 DIAGNOSIS — E1140 Type 2 diabetes mellitus with diabetic neuropathy, unspecified: Secondary | ICD-10-CM

## 2017-01-22 DIAGNOSIS — M79676 Pain in unspecified toe(s): Secondary | ICD-10-CM

## 2017-01-22 DIAGNOSIS — B351 Tinea unguium: Secondary | ICD-10-CM

## 2017-01-22 DIAGNOSIS — Q828 Other specified congenital malformations of skin: Secondary | ICD-10-CM

## 2017-01-22 NOTE — Progress Notes (Signed)
HPI  Jessica [name] patient returns  to the office with her daughter for preventive foot care services.  She says her nails continue to be painful  . Patient has diabetic neuropathy.  Her callus has improved left foot    Review of Systems     Objective:   Physical Exam GENERAL APPEARANCE: Alert, conversant. Appropriately groomed. No acute distress.  VASCULAR: Pedal pulses palpable at  [facility] and PT bilateral.  Capillary refill time is immediate to all digits,  Normal temperature gradient.  Digital hair growth is present bilateral  NEUROLOGIC: sensation is normal to 5.07 monofilament at 5/5 sites bilateral.  Light touch is intact bilateral, Muscle strength normal.  MUSCULOSKELETAL: acceptable muscle strength, tone and stability bilateral.  Intrinsic muscluature intact bilateral.  Rectus appearance of foot and digits noted bilateral. .  DERMATOLOGIC: Plantar callus sub 3 left foot- mild.  skin color, texture, and turgor are within normal limits.  NAILS  Thick disfigured discolored nails both feet.      Assessment:    Onychomycosis     Plan:    Debride onychomycosis   RTC 3 months.

## 2017-01-27 ENCOUNTER — Other Ambulatory Visit: Payer: Self-pay | Admitting: Internal Medicine

## 2017-02-11 ENCOUNTER — Telehealth: Payer: Self-pay | Admitting: Cardiology

## 2017-02-11 ENCOUNTER — Ambulatory Visit (INDEPENDENT_AMBULATORY_CARE_PROVIDER_SITE_OTHER): Payer: Medicare Other | Admitting: Cardiology

## 2017-02-11 ENCOUNTER — Other Ambulatory Visit: Payer: Medicare Other | Admitting: *Deleted

## 2017-02-11 ENCOUNTER — Encounter: Payer: Self-pay | Admitting: Cardiology

## 2017-02-11 VITALS — BP 126/74 | HR 86 | Ht 64.0 in | Wt 171.0 lb

## 2017-02-11 DIAGNOSIS — I110 Hypertensive heart disease with heart failure: Secondary | ICD-10-CM

## 2017-02-11 DIAGNOSIS — I480 Paroxysmal atrial fibrillation: Secondary | ICD-10-CM

## 2017-02-11 DIAGNOSIS — N179 Acute kidney failure, unspecified: Secondary | ICD-10-CM

## 2017-02-11 DIAGNOSIS — I5033 Acute on chronic diastolic (congestive) heart failure: Secondary | ICD-10-CM | POA: Diagnosis not present

## 2017-02-11 DIAGNOSIS — N189 Chronic kidney disease, unspecified: Secondary | ICD-10-CM | POA: Diagnosis not present

## 2017-02-11 DIAGNOSIS — I2510 Atherosclerotic heart disease of native coronary artery without angina pectoris: Secondary | ICD-10-CM

## 2017-02-11 MED ORDER — FUROSEMIDE 40 MG PO TABS: 40.0000 mg | ORAL_TABLET | Freq: Every day | ORAL | 0 refills | Status: DC

## 2017-02-11 MED ORDER — SPIRONOLACTONE 25 MG PO TABS: 25.0000 mg | ORAL_TABLET | Freq: Every day | ORAL | 2 refills | Status: DC

## 2017-02-11 NOTE — Patient Instructions (Signed)
Medication Instructions:   START TAKING LASIX 40 MG ONCE DAILY---ONLY ON TOMORROW 02/12/17, DR NELSON WANTS YOU TO TAKE LASIX 40 MG AT 8 AM AND THEN TAKE 40 MG AT 2 PM.  AFTER TOMORROW YOU SHOULD GO BACK TO TAKING LASIX 40 MG ONCE DAILY THEREAFTER.     START TAKING SPIRONOLACTONE 25 MG ONCE DAILY    Labwork:   TODAY-  BMET, MAGNESIUM, TSH, AND PRO-BNP    Testing/Procedures:  Your physician has requested that you have an echocardiogram. Echocardiography is a painless test that uses sound waves to create images of your heart. It provides your doctor with information about the size and shape of your heart and how well your heart's chambers and valves are working. This procedure takes approximately one hour. There are no restrictions for this procedure.    Follow-Up:  WITH DAYNA DUNN PA-C IN 7 DAYS ---IF DAYNA HAS NO AVAILABILITY, THEN PLEASE SCHEDULE WITH ANOTHER EXTENDER ON DR NELSON'S TEAM        If you need a refill on your cardiac medications before your next appointment, please call your pharmacy.

## 2017-02-11 NOTE — Progress Notes (Signed)
Cardiology Office Note    Date:  02/11/2017  ID:  MELLISSA CONLEY, DOB 1927/03/15, MRN [id number] PCP:  Binnie Rail, MD  Cardiologist:  [name]   Chief Complaint: muscle twitching  History of Present Illness:  Jessica [name] is a 83 y.o. female with history of CAD (DESx2 to mid and distal RCA in 2010), chronic diastolic CHF, prior noncompliance with fluid restriction, hypertensive heart disease, DM with peripheral neuropathy, HTN, HLD, SSS s/p Medtronic pacemaker 2011, PAF (discovered on device interrogation 10/2014), CKD stage III, moderate pulm HTN (by last echo) who presents for f/u of edema. She was last hospitalized for acute diastolic CHF in 01/4195 in the setting of accelerated HTN and PAF. She underwent DCCV at that time. Echo 06/2015: EF 55-60%, no RWMA, mild AI, mild-mod MR, mild LAE/RAE, mildly reduced RV function, mod TR, PASP 58mmHg. She was seen by Dr. [name] 09/03/16 at which time she had worsening LEE, crackles, increased abdominal girth and weight gain. Lasix was increased to 40mg  BID and HCTZ was stopped. Pacer interrogation at that time showed very low AF burden of 0.6%. She returned to clinic 09/18/16 - weight was only down 1 lb but LEE was improving. She continued to report drinking up to 4L of water per day however. Repeat BMET 09/18/16 showed BUN 34/Cr 1.42 (up from baseline around 1.1), CBC wnl, BNP 283. She was advised to decrease Lasix to 40mg  daily. As I suspected her BP would further increase with drop in diuretic dose, I pre-emptively increased hydralazine to 50mg  TID (BP was 186/94 at last OV).   She returns for follow-up today with her daughter. BP is running on the softer side. She brings a log of her BP and they are all in the 222-979 range systolic now. She is finally drinking significantly less water. Her LEE has resolved. She still has some increased abdominal girth but in talking to her further, she greatly reduced her physical activity after a fall last year so there  is some suspected increase in body weight as well. No CP or SOB. Her daughter is concerned because she naps a lot during the day but she also reports inconsistent nighttime sleep, often going to bed very late. Last night she went to bed after 1am and woke up at 5:30am.  12/31/2016 - the patient is coming accompanied by her granddaughter, she states that her LE edema got worse again. At the last visit we have cut her afternoon lasix as her LE edema has resolved. She denies PND. She can walk short distances with a walker. No falls. No chest pain.   01/20/2017 - the patient is coming after 4 days, at the last visit she has complained of involuntary muscle twitching and dizziness. She was found to be hypotensive, with hypokalemia and worsening Crea (acute on chronic kidney failure). She was advised to hold lasix, hydralazine, decrease lisinopril from 40 -> 20 mg po daily.  Today she reports improvement of both dizziness and muscle twitching. She has more energy. Her LE edema has resolved.  02/11/2017 - 3 week follow up, the patient has developed LE edema, PND and SOB again, The last night she also felt chest pain, non-exertional. She stopped taking lasix as she was instructed. Her LE cramping has resolved. She is up 6 lbs.   Past Medical History:  Diagnosis Date  . Breast cancer ([place])   . CAD (coronary artery disease)    a. s/p DESx2 to mid and distal RCA in 2010.  Marland Kitchen  Cholelithiasis 09/12/2013   Lap Chole on 09/14/13   . Chronic diastolic CHF (congestive heart failure) ([place])    a. Echo 06/09/15: EF 55-60%, normal wall motion, mild AI, mild to moderate MR, mild LAE, mildly reduced RVSF, mild RAE, moderate TR, PASP 53 mmHg  . CKD (chronic kidney disease), stage III   . Diabetes mellitus    NON INSULIN DEPENDENT  . DJD (degenerative joint disease)   . GERD (gastroesophageal reflux disease)   . Hemorrhoids   . History of diabetic neuropathy   . Hypercholesterolemia   . Hypertension   . Hypertensive  heart disease   . Paroxysmal atrial fibrillation (HCC)    a. discovered on PPM interrogation 12/15, CHAD2VASC score is 6. b. decompensation with dCHF in 06/2015 possibly due to AF, s/p DCCV.  . SSS (sick sinus syndrome) ([place])    a. s/p Medtronic PPM by JA for SSS and syncope 9/11.    Past Surgical History:  Procedure Laterality Date  . CARDIAC CATHETERIZATION  07/05/2009   EF 55-60%  . CARDIOVERSION N/A 06/11/2015   Procedure: CARDIOVERSION;  Surgeon: [name], MD;  Location[facility] ENDOSCOPY;  Service: Cardiovascular;  Laterality: N/A;  . CHOLECYSTECTOMY N/A 09/14/2013   Procedure: LAPAROSCOPIC CHOLECYSTECTOMY WITH INTRAOPERATIVE CHOLANGIOGRAM;  Surgeon: [name], MD;  Location: [place];  Service: General;  Laterality: N/A;  . COLONOSCOPY    . ERCP N/A 09/13/2013   Procedure: ENDOSCOPIC RETROGRADE CHOLANGIOPANCREATOGRAPHY (ERCP);  Surgeon: [name], MD;  Location: [facility] ENDOSCOPY;  Service: Endoscopy;  Laterality: N/A;  . INSERT / REPLACE / REMOVE PACEMAKER  9/11   SSS and syncope, implanted by JA (MDT)  . MASTECTOMY  1992   BILATERAL WITH RECONSTRUCTION    Current Medications: Current Outpatient Prescriptions  Medication Sig Dispense Refill  . apixaban (ELIQUIS) 5 MG TABS tablet Take 5 mg by mouth 2 (two) times daily.    Marland Kitchen b complex vitamins tablet Take 1 tablet by mouth daily.    . calcium carbonate (OS-CAL) 600 MG TABS tablet Take 600 mg by mouth daily with breakfast.     . carvedilol (COREG) 25 MG tablet TAKE ONE TABLET BY MOUTH TWICE DAILY. 60 tablet 2  . Cyanocobalamin (VITAMIN B12 PO) Take 1 tablet by mouth daily.     . fish oil-omega-3 fatty acids 1000 MG capsule Take 1 g by mouth every morning.     . furosemide (LASIX) 40 MG tablet Take 1 tablet (40 mg total) by mouth 2 (two) times daily. (Patient taking differently: Take 40 mg by mouth daily per phone instructions. ) 180 tablet 1  . gabapentin (NEURONTIN) 100 MG capsule Take 1 capsule (100 mg total) by mouth at  bedtime. Take in addition to the 300 mg cap you are already taking at night 90 capsule 3  . gabapentin (NEURONTIN) 300 MG capsule TAKE ONE CAPSULE BY MOUTH TWICE DAILY 60 capsule 5  . hydrALAZINE (APRESOLINE) 25 MG tablet TAKE ONE TABLET BY MOUTH EVERY 8 HOURS - (taking 2 tabs q8hr per recent phone instructions) 270 tablet 2  . lisinopril (PRINIVIL,ZESTRIL) 20 MG tablet Take 1 tablet (20 mg total) by mouth 2 (two) times daily. 180 tablet 3  . metFORMIN (GLUCOPHAGE) 500 MG tablet TAKE ONE TABLET BY MOUTH ONCE DAILY WITH BREAKFAST 90 tablet 3  . nitroGLYCERIN (NITROSTAT) 0.4 MG SL tablet Place 1 tablet (0.4 mg total) under the tongue every 5 (five) minutes as needed for chest pain. x3 doses as needed for chest pain 25  tablet prn  . polyethylene glycol (MIRALAX / GLYCOLAX) packet Take 17 g by mouth daily as needed for moderate constipation.    . rosuvastatin (CRESTOR) 10 MG tablet TAKE ONE TABLET BY MOUTH ONCE DAILY IN THE MORNING 90 tablet 3   No current facility-administered medications for this visit.      Allergies:   Lyrica [pregabalin]; Amlodipine; and Sulfonamide derivatives   Social History   Social History  . Marital status: Widowed    Spouse name: N/A  . Number of children: N/A  . Years of education: N/A   Social History Main Topics  . Smoking status: Never Smoker  . Smokeless tobacco: Never Used  . Alcohol use No  . Drug use: No  . Sexual activity: Not Asked   Other Topics Concern  . None   Social History Narrative  . None     Family History:  The patient's family history includes Cancer in her father and mother.   ROS:   Please see the history of present illness. Otherwise, review of systems is positive for insomnia. All other systems are reviewed and otherwise negative.    PHYSICAL EXAM:   VS:  BP 126/74   Pulse 86   Ht 5\' 4"  (1.626 m)   Wt 171 lb (77.6 kg)   SpO2 93%   BMI 29.35 kg/m   BMI: Body mass index is 29.35 kg/m. GEN: Well nourished, well  developed obese WF in no acute distress  HEENT: normocephalic, atraumatic Neck: no JVD, carotid bruits, or masses Cardiac: RRR; no murmurs, rubs, or gallops, LE edema +2 B/L, varicose veins noted Respiratory: crackles at bases bilaterally, normal work of breathing GI: rounded but soft, nontender, + BS MS: no deformity or atrophy  Skin: warm and dry, no rash Neuro:  Alert and Oriented x 3, Strength and sensation are intact, follows commands Psych: euthymic mood, full affect  Wt Readings from Last 3 Encounters:  02/11/17 171 lb (77.6 kg)  01/20/17 165 lb (74.8 kg)  01/16/17 163 lb 1.9 oz (74 kg)      Studies/Labs Reviewed:   EKG:  EKG was not ordered today.  Recent Labs: 04/01/2016: TSH 2.95 09/18/2016: Brain Natriuretic Peptide 283.0; Hemoglobin 11.8 10/01/2016: ALT 10 01/16/2017: Magnesium 2.2; Platelets 222 01/19/2017: BUN 63; Creatinine, Ser 1.92; NT-Pro BNP 4,535; Potassium 4.3; Sodium 143   Lipid Panel    Component Value Date/Time   CHOL 101 06/09/2015 0330   TRIG 52 06/09/2015 0330   HDL 38 (L) 06/09/2015 0330   CHOLHDL 2.7 06/09/2015 0330   VLDL 10 06/09/2015 0330   LDLCALC 53 06/09/2015 0330   LDLDIRECT 152.0 10/10/2013 1619    Additional studies/ records that were reviewed today include: Summarized above.    ASSESSMENT & PLAN:   1. Acute on chronic diastolic/ Hypertensive heart disease  - she is up 6 lbs - we will restart Lasix 40 mg po daily and start spironolactone 25 mg po daily - we have to find a balance as she previously developed acute kidney failure with hypokalemia and leg cramps - check BMP and BNP today  - Crea 1.3 -> 1.6 -> 2.3 -> 1.9   2. CKD stage III - as above  3. HTN - as above  4. Paroxysmal atrial fib - low burden by recent interrogation of pacemaker. Continue Eliquis - the dose was decreased with decreasing GFR  5. CAD- she is not describing any recurrent symptoms of angina. Continue BB, statin. Not on ASA due to ongoing  Eliquis  use.  Disposition: F/u in 1 week.  Medication Adjustments/Labs and Tests Ordered: Current medicines are reviewed at length with the patient today.  Concerns regarding medicines are outlined above. Medication changes, Labs and Tests ordered today are summarized above and listed in the Patient Instructions accessible in Encounters.   Signed, Ena Dawley, MD 02/11/2017 3:46 PM    [address], [place], [place]  [address] Phone: [phone]; Fax: [phone]

## 2017-02-11 NOTE — Telephone Encounter (Signed)
Notified Jessica [name] at [place] that yes, Dr [name] wants the pt to take both spironolactone and lisinopril together.  Informed the Pharmacist that the rationale for this is because the pt has had known hypokalemia with leg cramps in the past, as indicated in Dr Francesca Oman last assessment and plan.  Pharmacist verbalized understanding and agrees with this plan.  Pharmacist will proceed with ordered med.

## 2017-02-11 NOTE — Telephone Encounter (Signed)
Follow Up:   Jessica [name] is calling concerning prescription that was sent over today. There is an interaction between the Spironolactone and the Lisinopril. Her question is,does Dr [name] wants to start the Spironolactone?

## 2017-02-12 ENCOUNTER — Telehealth: Payer: Self-pay | Admitting: *Deleted

## 2017-02-12 DIAGNOSIS — I110 Hypertensive heart disease with heart failure: Secondary | ICD-10-CM

## 2017-02-12 DIAGNOSIS — N183 Chronic kidney disease, stage 3 unspecified: Secondary | ICD-10-CM

## 2017-02-12 DIAGNOSIS — I5033 Acute on chronic diastolic (congestive) heart failure: Secondary | ICD-10-CM

## 2017-02-12 LAB — BASIC METABOLIC PANEL
BUN/Creatinine Ratio: 17 (ref 12–28)
BUN: 20 mg/dL (ref 8–27)
CO2: 25 mmol/L (ref 18–29)
Calcium: 10.2 mg/dL (ref 8.7–10.3)
Chloride: 106 mmol/L (ref 96–106)
Creatinine, Ser: 1.2 mg/dL — ABNORMAL HIGH (ref 0.57–1.00)
GFR calc Af Amer: 47 mL/min/{1.73_m2} — ABNORMAL LOW (ref >59)
GFR calc non Af Amer: 41 mL/min/{1.73_m2} — ABNORMAL LOW (ref >59)
Glucose: 90 mg/dL (ref 65–99)
Potassium: 4.1 mmol/L (ref 3.5–5.2)
Sodium: 147 mmol/L — ABNORMAL HIGH (ref 134–144)

## 2017-02-12 LAB — TSH: TSH: 3.5 u[IU]/mL (ref 0.450–4.500)

## 2017-02-12 LAB — PRO B NATRIURETIC PEPTIDE: NT-Pro BNP: 7753 pg/mL — ABNORMAL HIGH (ref 0–738)

## 2017-02-12 LAB — MAGNESIUM: Magnesium: 2.1 mg/dL (ref 1.6–2.3)

## 2017-02-12 MED ORDER — FUROSEMIDE 40 MG PO TABS: 40.0000 mg | ORAL_TABLET | Freq: Every day | ORAL | 0 refills | Status: DC

## 2017-02-12 NOTE — Telephone Encounter (Signed)
-----   Message from [name], MD sent at 02/12/2017  4:57 PM EDT ----- She has severe CHF, her Crea is almost back to normal and K and Mg are normal. Please advice her to take Lasix 40 mg po BID and spironolactone 25 mg po daily until the next week's visit, then decrease to lasix 40 mg po daily and spironolactone 25 mg po daily. She will need repeat BMP and BNP at that time.  Thank you, K

## 2017-02-12 NOTE — Telephone Encounter (Signed)
Notified the pts daughter of her lab results and recommendations for the pt to increase her lasix to 40 mg po bid until 4/17 appt with Vin PA-C, then go back to her regimen of lasix 40 mg po daily thereafter.  Informed the pt daughter that we will hold off on refilling lasix, until future labs on 4/17 are drawn to recheck a bmet and pro-bnp.  Informed the daughter that her lasix dose could change then, and she has enough supply on hand at this time. Advised the pts daughter that she should  continue with spironolactone 25 mg po daily, and bring the pt in the office 20 mins prior to seeing Vin on 4/17, to recheck a bmet and pro-bnp.  Scheduled lab for 4/17 to recheck bmet and pro-bnp.  Daughter verbalized understanding and agrees with this plan.  Informed the pts daughter that her K and Mg is normal.

## 2017-02-13 NOTE — Progress Notes (Signed)
Cardiology Office Note    Date:  02/17/2017   ID:  Jessica [name], DOB [birth year], MRN [id number]  PCP:  Binnie Rail, MD  Cardiologist: Dr. [name]  EP: Dr.[name]  Chief Complaint: 1 week follow up for CHF  History of Present Illness:   Jessica [name] is a 83 y.o. female with history of CAD (DESx2 to mid and distal RCA in 2010), chronic diastolic CHF, prior noncompliance with fluid restriction, hypertensive heart disease, DM with peripheral neuropathy, HTN, HLD, SSS s/p Medtronic pacemaker 2011, PAF (discovered on device interrogation 10/2014), CKD stage III, moderate pulm HTN (by last echo) who presents for follow up.   Recently requiring multiple adjustment of lasix in past 6 months due to edema. Notes reviewed (see detailed 02/11/17). Last seen by Dr. [name]  02/11/17 for follow up and noted LE edema, SOB and PND. BNP was up to 7753 with Scr back to normal 1.2. She was advised to increase Lasix 40 mg po to BID and spironolactone 25 mg po daily until the next week's visit,  then decrease to lasix 40 mg po daily and spironolactone 25 mg po daily.   Here today for follow up. He is feeling better on increased dose of Lasix. She lost 8 pounds based on our scale (171-->163lb). Denies orthopnea, PND, syncope, dizziness or melena.. Lower extremity edema improving. She does have a intermittent shortness of breath with exertion.     Past Medical History:  Diagnosis Date  . Breast cancer ([place])   . CAD (coronary artery disease)    a. s/p DESx2 to mid and distal RCA in 2010.  Marland Kitchen Cholelithiasis 09/12/2013   Lap Chole on 09/14/13   . Chronic diastolic CHF (congestive heart failure) ([place])    a. Echo 06/09/15: EF 55-60%, normal wall motion, mild AI, mild to moderate MR, mild LAE, mildly reduced RVSF, mild RAE, moderate TR, PASP 53 mmHg  . CKD (chronic kidney disease), stage III   . Diabetes mellitus    NON INSULIN DEPENDENT  . DJD (degenerative joint disease)   . GERD (gastroesophageal reflux  disease)   . Hemorrhoids   . History of diabetic neuropathy   . Hypercholesterolemia   . Hypertension   . Hypertensive heart disease   . Paroxysmal atrial fibrillation (HCC)    a. discovered on PPM interrogation 12/15, CHAD2VASC score is 6. b. decompensation with dCHF in 06/2015 possibly due to AF, s/p DCCV.  . SSS (sick sinus syndrome) ([place])    a. s/p Medtronic PPM by JA for SSS and syncope 9/11.    Past Surgical History:  Procedure Laterality Date  . CARDIAC CATHETERIZATION  07/05/2009   EF 55-60%  . CARDIOVERSION N/A 06/11/2015   Procedure: CARDIOVERSION;  Surgeon: [name], MD;  Location[facility] ENDOSCOPY;  Service: Cardiovascular;  Laterality: N/A;  . CHOLECYSTECTOMY N/A 09/14/2013   Procedure: LAPAROSCOPIC CHOLECYSTECTOMY WITH INTRAOPERATIVE CHOLANGIOGRAM;  Surgeon: [name], MD;  Location: [place];  Service: General;  Laterality: N/A;  . COLONOSCOPY    . ERCP N/A 09/13/2013   Procedure: ENDOSCOPIC RETROGRADE CHOLANGIOPANCREATOGRAPHY (ERCP);  Surgeon: [name], MD;  Location: [facility] ENDOSCOPY;  Service: Endoscopy;  Laterality: N/A;  . INSERT / REPLACE / REMOVE PACEMAKER  9/11   SSS and syncope, implanted by JA (MDT)  . MASTECTOMY  1992   BILATERAL WITH RECONSTRUCTION    Current Medications: Prior to Admission medications   Medication Sig Start Date End Date Taking? Authorizing Provider  apixaban (ELIQUIS) 2.5 MG  TABS tablet Take 1 tablet (2.5 mg total) by mouth 2 (two) times daily. 01/16/17   Dayna N Dunn, PA-C  b complex vitamins tablet Take 1 tablet by mouth daily.    Historical Provider, MD  blood glucose meter kit and supplies KIT Dispense based on patient and insurance preference. Test sugars once daily and prn as directed. (FOR ICD-9 250.00, 250.01). 10/21/16   Binnie Rail, MD  carvedilol (COREG) 25 MG tablet TAKE ONE TABLET BY MOUTH TWICE DAILY 10/17/16   [name], MD  ECHINACEA PO Take 750 mg by mouth daily.    Historical Provider, MD  fish  oil-omega-3 fatty acids 1000 MG capsule Take 1 g by mouth every morning.     Historical Provider, MD  furosemide (LASIX) 40 MG tablet Take 1 tablet (40 mg total) by mouth daily. 02/12/17 05/13/17  [name], MD  gabapentin (NEURONTIN) 100 MG capsule Take 1 capsule (100 mg total) by mouth at bedtime. Take in addition to the 300 mg cap you are already taking at night 04/29/16   Binnie Rail, MD  gabapentin (NEURONTIN) 300 MG capsule TAKE ONE CAPSULE BY MOUTH TWICE DAILY 01/27/17   Binnie Rail, MD  hydrALAZINE (APRESOLINE) 25 MG tablet Take 1 tablet (25 mg total) by mouth 2 (two) times daily as needed. FOR BP GREATER THAN 132 SYSTOLIC 4/40/10 2/72/53  [name], MD  lisinopril (PRINIVIL,ZESTRIL) 10 MG tablet Take 1 tablet (10 mg total) by mouth daily. 01/20/17 04/20/17  [name], MD  metFORMIN (GLUCOPHAGE) 500 MG tablet TAKE ONE TABLET BY MOUTH ONCE DAILY WITH BREAKFAST 04/29/16   Binnie Rail, MD  Misc. Devices (ROLLATOR ULTRA-LIGHT) MISC With seat.  Diabetic neuropathy, knee arthritis 10/21/16   Binnie Rail, MD  nitroGLYCERIN (NITROSTAT) 0.4 MG SL tablet Place 1 tablet (0.4 mg total) under the tongue every 5 (five) minutes as needed for chest pain. x3 doses as needed for chest pain 10/21/16   Binnie Rail, MD  rosuvastatin (CRESTOR) 10 MG tablet TAKE ONE TABLET BY MOUTH ONCE DAILY IN THE MORNING 04/29/16   Binnie Rail, MD  spironolactone (ALDACTONE) 25 MG tablet Take 1 tablet (25 mg total) by mouth daily. 02/11/17 05/12/17  [name], MD    Allergies:   Lyrica [pregabalin]; Amlodipine; and Sulfonamide derivatives   Social History   Social History  . Marital status: Widowed    Spouse name: N/A  . Number of children: N/A  . Years of education: N/A   Social History Main Topics  . Smoking status: Never Smoker  . Smokeless tobacco: Never Used  . Alcohol use No  . Drug use: No  . Sexual activity: Not on file   Other Topics Concern  . Not on file   Social  History Narrative  . No narrative on file     Family History:  The patient's family history includes Cancer in her father and mother.   ROS:   Please see the history of present illness.    ROS All other systems reviewed and are negative.   PHYSICAL EXAM:   VS:  BP 122/80   Pulse 88   Wt 163 lb (73.9 kg)   SpO2 97%   BMI 27.98 kg/m    GEN: Well nourished, well developed, in no acute distress  HEENT: normal  Neck: no JVD, carotid bruits, or masses Cardiac:  RRR; no murmurs, rubs, or gallops, Trace BL LE  edema  Respiratory: normal  work of breathing. Faint rales on R base GI: soft, nontender, nondistended, + BS MS: no deformity or atrophy  Skin: warm and dry, no rash Neuro:  Alert and Oriented x 3, Strength and sensation are intact Psych: euthymic mood, full affect  Wt Readings from Last 3 Encounters:  02/17/17 163 lb (73.9 kg)  02/11/17 171 lb (77.6 kg)  01/20/17 165 lb (74.8 kg)      Studies/Labs Reviewed:   EKG:  EKG is not ordered today.    Recent Labs: 09/18/2016: Brain Natriuretic Peptide 283.0; Hemoglobin 11.8 10/01/2016: ALT 10 01/16/2017: Platelets 222 02/11/2017: BUN 20; Creatinine, Ser 1.20; Magnesium 2.1; NT-Pro BNP 7,753; Potassium 4.1; Sodium 147; TSH 3.500   Lipid Panel    Component Value Date/Time   CHOL 101 06/09/2015 0330   TRIG 52 06/09/2015 0330   HDL 38 (L) 06/09/2015 0330   CHOLHDL 2.7 06/09/2015 0330   VLDL 10 06/09/2015 0330   LDLCALC 53 06/09/2015 0330   LDLDIRECT 152.0 10/10/2013 1619    Additional studies/ records that were reviewed today include:   Echocardiogram: 06/2015 Study Conclusions  - Left ventricle: The cavity size was normal. Systolic function was   normal. The estimated ejection fraction was in the range of 55%   to 60%. Wall motion was normal; there were no regional wall   motion abnormalities. Doppler parameters are consistent with   elevated mean left atrial filling pressure. - Aortic valve: There was mild  regurgitation. - Mitral valve: There was mild to moderate regurgitation directed   centrally. - Left atrium: The atrium was mildly dilated. - Right ventricle: Systolic function was mildly reduced. - Right atrium: The atrium was mildly dilated. - Tricuspid valve: There was moderate regurgitation. - Pulmonary arteries: Systolic pressure was moderately increased.   PA peak pressure: 53 mm Hg (S).    ASSESSMENT & PLAN:    1. Acute on chronic diastolic heart failure - Recently requiring multiple adjustment due to recurrent exacerbation and AKI with hypokalemia and leg cramps. - We will decrease lasix to 40 mg po daily and spironolactone to 25 mg po daily as recommended by Dr. [name]. Get BNP & BMP today. If significantly elevated BNP again we might get a higher dose of Lasix for a few days. Mild to volume overload on exam. - Advised to take extra 20 mg of Lasix for intermittent edema/weight gain/shortness of breath. Discussed plan with her daughter in detail. She is acutely with that. - Last echo 06/2015 showed normal LV function, mild valvular abnormality and PA Pressure of 92mg HG. Pending repeat echo. Might need CHF team referral if severely elevated pulmonary pressure.   2. Hypertensive heart disease with CHF and CKD - Stable and well controlled on current regimen.   3. CKD stage III - Recent creatinine  1.3 -> 1.6 -> 2.3 -> 1.9--> 1.2 (02/11/17). BMET today  4. PAF - Low burden by recent interrogation of pacemaker.  - Continue Eliquis - the dose was decreased with decreasing GFR  5. CAD - No angina. Continue BB, statin. Not on ASA due to need of anticoagulation.      Medication Adjustments/Labs and Tests Ordered: Current medicines are reviewed at length with the patient today.  Concerns regarding medicines are outlined above.  Medication changes, Labs and Tests ordered today are listed in the Patient Instructions below. Patient Instructions  Medication Instructions:  Your  physician has recommended you make the following change in your medication:  1.  DECREASE the Lasix to 40  mg taking 1 tablet daily   Labwork: TODAY:  BMET & PRO BNP  Testing/Procedures: None ordered  Follow-Up: Your physician recommends that you schedule a follow-up appointment in: 2 [place]  Any Other Special Instructions Will Be Listed Below (If Applicable).     If you need a refill on your cardiac medications before your next appointment, please call your pharmacy.      Jarrett Soho, [place]  02/17/2017 3:52 PM    [facility] [place], [place], [place]  [address] Phone: [phone]; Fax: [phone]

## 2017-02-17 ENCOUNTER — Other Ambulatory Visit: Payer: Medicare Other

## 2017-02-17 ENCOUNTER — Ambulatory Visit (INDEPENDENT_AMBULATORY_CARE_PROVIDER_SITE_OTHER): Payer: Medicare Other | Admitting: Physician Assistant

## 2017-02-17 ENCOUNTER — Encounter (INDEPENDENT_AMBULATORY_CARE_PROVIDER_SITE_OTHER): Payer: Self-pay

## 2017-02-17 VITALS — BP 122/80 | HR 88 | Wt 163.0 lb

## 2017-02-17 DIAGNOSIS — I110 Hypertensive heart disease with heart failure: Secondary | ICD-10-CM

## 2017-02-17 DIAGNOSIS — I480 Paroxysmal atrial fibrillation: Secondary | ICD-10-CM

## 2017-02-17 DIAGNOSIS — N183 Chronic kidney disease, stage 3 unspecified: Secondary | ICD-10-CM

## 2017-02-17 DIAGNOSIS — I5033 Acute on chronic diastolic (congestive) heart failure: Secondary | ICD-10-CM | POA: Diagnosis not present

## 2017-02-17 DIAGNOSIS — I2510 Atherosclerotic heart disease of native coronary artery without angina pectoris: Secondary | ICD-10-CM | POA: Diagnosis not present

## 2017-02-17 NOTE — Patient Instructions (Signed)
Medication Instructions:  Your physician has recommended you make the following change in your medication:  1.  DECREASE the Lasix to 40 mg taking 1 tablet daily   Labwork: TODAY:  BMET & PRO BNP  Testing/Procedures: None ordered  Follow-Up: Your physician recommends that you schedule a follow-up appointment in: 2 [place]  Any Other Special Instructions Will Be Listed Below (If Applicable).     If you need a refill on your cardiac medications before your next appointment, please call your pharmacy.

## 2017-02-18 LAB — BASIC METABOLIC PANEL
BUN/Creatinine Ratio: 16 (ref 12–28)
BUN: 19 mg/dL (ref 8–27)
CO2: 28 mmol/L (ref 18–29)
Calcium: 10.1 mg/dL (ref 8.7–10.3)
Chloride: 102 mmol/L (ref 96–106)
Creatinine, Ser: 1.19 mg/dL — ABNORMAL HIGH (ref 0.57–1.00)
GFR calc Af Amer: 47 mL/min/{1.73_m2} — ABNORMAL LOW (ref >59)
GFR calc non Af Amer: 41 mL/min/{1.73_m2} — ABNORMAL LOW (ref >59)
Glucose: 100 mg/dL — ABNORMAL HIGH (ref 65–99)
Potassium: 3.8 mmol/L (ref 3.5–5.2)
Sodium: 145 mmol/L — ABNORMAL HIGH (ref 134–144)

## 2017-02-18 LAB — PRO B NATRIURETIC PEPTIDE: NT-Pro BNP: 4735 pg/mL — ABNORMAL HIGH (ref 0–738)

## 2017-02-24 ENCOUNTER — Ambulatory Visit (HOSPITAL_COMMUNITY): Payer: Medicare Other | Attending: Cardiovascular Disease

## 2017-02-24 ENCOUNTER — Other Ambulatory Visit: Payer: Self-pay

## 2017-02-24 DIAGNOSIS — I083 Combined rheumatic disorders of mitral, aortic and tricuspid valves: Secondary | ICD-10-CM | POA: Diagnosis not present

## 2017-02-24 DIAGNOSIS — I2510 Atherosclerotic heart disease of native coronary artery without angina pectoris: Secondary | ICD-10-CM | POA: Insufficient documentation

## 2017-02-24 DIAGNOSIS — Z950 Presence of cardiac pacemaker: Secondary | ICD-10-CM | POA: Diagnosis not present

## 2017-02-24 DIAGNOSIS — I110 Hypertensive heart disease with heart failure: Secondary | ICD-10-CM

## 2017-02-24 DIAGNOSIS — E119 Type 2 diabetes mellitus without complications: Secondary | ICD-10-CM | POA: Diagnosis not present

## 2017-02-24 DIAGNOSIS — I4891 Unspecified atrial fibrillation: Secondary | ICD-10-CM | POA: Diagnosis not present

## 2017-02-24 DIAGNOSIS — Z853 Personal history of malignant neoplasm of breast: Secondary | ICD-10-CM | POA: Diagnosis not present

## 2017-02-24 DIAGNOSIS — N189 Chronic kidney disease, unspecified: Secondary | ICD-10-CM | POA: Diagnosis not present

## 2017-02-24 DIAGNOSIS — E785 Hyperlipidemia, unspecified: Secondary | ICD-10-CM | POA: Insufficient documentation

## 2017-02-24 DIAGNOSIS — I5033 Acute on chronic diastolic (congestive) heart failure: Secondary | ICD-10-CM

## 2017-02-24 DIAGNOSIS — I495 Sick sinus syndrome: Secondary | ICD-10-CM | POA: Insufficient documentation

## 2017-02-26 NOTE — Progress Notes (Signed)
That perfectly fine with me and very reasonable approach. We will continue comfort measures  Thank you, KN

## 2017-02-27 NOTE — Progress Notes (Signed)
I am ok with just medical therapy and no stress test. I agree with them.

## 2017-03-04 ENCOUNTER — Telehealth: Payer: Self-pay | Admitting: Cardiology

## 2017-03-04 ENCOUNTER — Ambulatory Visit (INDEPENDENT_AMBULATORY_CARE_PROVIDER_SITE_OTHER): Payer: Medicare Other | Admitting: *Deleted

## 2017-03-04 DIAGNOSIS — I495 Sick sinus syndrome: Secondary | ICD-10-CM

## 2017-03-04 NOTE — Telephone Encounter (Signed)
Confirmed remote transmission w/ pt daughter.   

## 2017-03-05 ENCOUNTER — Encounter: Payer: Self-pay | Admitting: Cardiology

## 2017-03-05 NOTE — Progress Notes (Signed)
Remote pacemaker transmission.   

## 2017-03-06 LAB — CUP PACEART REMOTE DEVICE CHECK
Brady Statistic AP VP Percent: 17 %
Brady Statistic AP VS Percent: 48 %
Brady Statistic AS VP Percent: 1 %
Brady Statistic AS VS Percent: 33 %
Date Time Interrogation Session: 20180502194657
Implantable Lead Location: 753860
Lead Channel Pacing Threshold Amplitude: 0.75 V
Lead Channel Pacing Threshold Pulse Width: 0.4 ms
Lead Channel Sensing Intrinsic Amplitude: 5.6 mV
Lead Channel Setting Pacing Pulse Width: 0.4 ms
MDC IDC LEAD IMPLANT DT: 20110912
MDC IDC LEAD IMPLANT DT: 20110912
MDC IDC LEAD LOCATION: 753859
MDC IDC MSMT BATTERY IMPEDANCE: 486 Ohm
MDC IDC MSMT BATTERY REMAINING LONGEVITY: 88 mo
MDC IDC MSMT BATTERY VOLTAGE: 2.79 V
MDC IDC MSMT LEADCHNL RA IMPEDANCE VALUE: 540 Ohm
MDC IDC MSMT LEADCHNL RA SENSING INTR AMPL: 0.7 mV
MDC IDC MSMT LEADCHNL RV IMPEDANCE VALUE: 710 Ohm
MDC IDC MSMT LEADCHNL RV PACING THRESHOLD AMPLITUDE: 0.625 V
MDC IDC MSMT LEADCHNL RV PACING THRESHOLD PULSEWIDTH: 0.4 ms
MDC IDC PG IMPLANT DT: 20110912
MDC IDC SET LEADCHNL RA PACING AMPLITUDE: 2 V
MDC IDC SET LEADCHNL RV PACING AMPLITUDE: 2.5 V
MDC IDC SET LEADCHNL RV SENSING SENSITIVITY: 2.8 mV

## 2017-03-10 ENCOUNTER — Encounter: Payer: Self-pay | Admitting: Cardiology

## 2017-03-26 ENCOUNTER — Ambulatory Visit: Payer: Medicare Other | Admitting: Podiatry

## 2017-03-27 ENCOUNTER — Ambulatory Visit (INDEPENDENT_AMBULATORY_CARE_PROVIDER_SITE_OTHER): Payer: Medicare Other | Admitting: Cardiology

## 2017-03-27 ENCOUNTER — Telehealth: Payer: Self-pay | Admitting: *Deleted

## 2017-03-27 ENCOUNTER — Encounter (INDEPENDENT_AMBULATORY_CARE_PROVIDER_SITE_OTHER): Payer: Self-pay

## 2017-03-27 ENCOUNTER — Encounter: Payer: Self-pay | Admitting: Cardiology

## 2017-03-27 VITALS — BP 90/42 | HR 68 | Ht 63.0 in | Wt 158.0 lb

## 2017-03-27 DIAGNOSIS — E860 Dehydration: Secondary | ICD-10-CM

## 2017-03-27 DIAGNOSIS — I110 Hypertensive heart disease with heart failure: Secondary | ICD-10-CM | POA: Diagnosis not present

## 2017-03-27 DIAGNOSIS — E876 Hypokalemia: Secondary | ICD-10-CM | POA: Diagnosis not present

## 2017-03-27 DIAGNOSIS — N179 Acute kidney failure, unspecified: Secondary | ICD-10-CM

## 2017-03-27 DIAGNOSIS — I428 Other cardiomyopathies: Secondary | ICD-10-CM

## 2017-03-27 DIAGNOSIS — R7989 Other specified abnormal findings of blood chemistry: Secondary | ICD-10-CM | POA: Insufficient documentation

## 2017-03-27 DIAGNOSIS — N189 Chronic kidney disease, unspecified: Secondary | ICD-10-CM

## 2017-03-27 DIAGNOSIS — I952 Hypotension due to drugs: Secondary | ICD-10-CM

## 2017-03-27 LAB — CBC WITH DIFFERENTIAL/PLATELET
Basophils Absolute: 0 10*3/uL (ref 0.0–0.2)
Basos: 0 %
EOS (ABSOLUTE): 0.1 10*3/uL (ref 0.0–0.4)
Eos: 1 %
Hematocrit: 35.7 % (ref 34.0–46.6)
Hemoglobin: 12.1 g/dL (ref 11.1–15.9)
Lymphocytes Absolute: 2.1 10*3/uL (ref 0.7–3.1)
Lymphs: 20 %
MCH: 29.2 pg (ref 26.6–33.0)
MCHC: 33.9 g/dL (ref 31.5–35.7)
MCV: 86 fL (ref 79–97)
Monocytes Absolute: 0.7 10*3/uL (ref 0.1–0.9)
Monocytes: 7 %
Neutrophils Absolute: 7.6 10*3/uL — ABNORMAL HIGH (ref 1.4–7.0)
Neutrophils: 72 %
Platelets: 207 10*3/uL (ref 150–379)
RBC: 4.14 x10E6/uL (ref 3.77–5.28)
RDW: 16.9 % — ABNORMAL HIGH (ref 12.3–15.4)
WBC: 10.5 10*3/uL (ref 3.4–10.8)

## 2017-03-27 LAB — COMPREHENSIVE METABOLIC PANEL
ALT: 13 IU/L (ref 0–32)
AST: 15 IU/L (ref 0–40)
Albumin/Globulin Ratio: 1.6 (ref 1.2–2.2)
Albumin: 3.9 g/dL (ref 3.5–4.7)
Alkaline Phosphatase: 77 IU/L (ref 39–117)
BUN/Creatinine Ratio: 22 (ref 12–28)
BUN: 68 mg/dL — ABNORMAL HIGH (ref 8–27)
Bilirubin Total: 0.3 mg/dL (ref 0.0–1.2)
CO2: 24 mmol/L (ref 18–29)
Calcium: 9.9 mg/dL (ref 8.7–10.3)
Chloride: 105 mmol/L (ref 96–106)
Creatinine, Ser: 3.13 mg/dL — ABNORMAL HIGH (ref 0.57–1.00)
GFR calc Af Amer: 15 mL/min/{1.73_m2} — ABNORMAL LOW (ref >59)
GFR calc non Af Amer: 13 mL/min/{1.73_m2} — ABNORMAL LOW (ref >59)
Globulin, Total: 2.4 g/dL (ref 1.5–4.5)
Glucose: 116 mg/dL — ABNORMAL HIGH (ref 65–99)
Potassium: 5 mmol/L (ref 3.5–5.2)
Sodium: 136 mmol/L (ref 134–144)
Total Protein: 6.3 g/dL (ref 6.0–8.5)

## 2017-03-27 LAB — PRO B NATRIURETIC PEPTIDE: NT-Pro BNP: 2510 pg/mL — ABNORMAL HIGH (ref 0–738)

## 2017-03-27 MED ORDER — CARVEDILOL 6.25 MG PO TABS: 6.2500 mg | ORAL_TABLET | Freq: Two times a day (BID) | ORAL | 1 refills | Status: DC

## 2017-03-27 MED ORDER — LISINOPRIL 2.5 MG PO TABS: 2.5000 mg | ORAL_TABLET | Freq: Every day | ORAL | 1 refills | Status: DC

## 2017-03-27 NOTE — Progress Notes (Signed)
Cardiology Office Note    Date:  03/27/2017  ID:  CHERRE KOTHARI, DOB 1926/12/16, MRN [id number] PCP:  Binnie Rail, MD  Cardiologist:  [name]   Chief Complaint: muscle twitching  History of Present Illness:  Jessica [name] is a 83 y.o. female with history of CAD (DESx2 to mid and distal RCA in 2010), chronic diastolic CHF, prior noncompliance with fluid restriction, hypertensive heart disease, DM with peripheral neuropathy, HTN, HLD, SSS s/p Medtronic pacemaker 2011, PAF (discovered on device interrogation 10/2014), CKD stage III, moderate pulm HTN (by last echo) who presents for f/u of edema. She was last hospitalized for acute diastolic CHF in 11/9507 in the setting of accelerated HTN and PAF. She underwent DCCV at that time. Echo 06/2015: EF 55-60%, no RWMA, mild AI, mild-mod MR, mild LAE/RAE, mildly reduced RV function, mod TR, PASP 23mmHg. She was seen by Dr. [name] 09/03/16 at which time she had worsening LEE, crackles, increased abdominal girth and weight gain. Lasix was increased to 40mg  BID and HCTZ was stopped. Pacer interrogation at that time showed very low AF burden of 0.6%. She returned to clinic 09/18/16 - weight was only down 1 lb but LEE was improving. She continued to report drinking up to 4L of water per day however. Repeat BMET 09/18/16 showed BUN 34/Cr 1.42 (up from baseline around 1.1), CBC wnl, BNP 283. She was advised to decrease Lasix to 40mg  daily. As I suspected her BP would further increase with drop in diuretic dose, I pre-emptively increased hydralazine to 50mg  TID (BP was 186/94 at last OV).   She returns for follow-up today with her daughter. BP is running on the softer side. She brings a log of her BP and they are all in the 326-712 range systolic now. She is finally drinking significantly less water. Her LEE has resolved. She still has some increased abdominal girth but in talking to her further, she greatly reduced her physical activity after a fall last year so there  is some suspected increase in body weight as well. No CP or SOB. Her daughter is concerned because she naps a lot during the day but she also reports inconsistent nighttime sleep, often going to bed very late. Last night she went to bed after 1am and woke up at 5:30am.  12/31/2016 - the patient is coming accompanied by her granddaughter, she states that her LE edema got worse again. At the last visit we have cut her afternoon lasix as her LE edema has resolved. She denies PND. She can walk short distances with a walker. No falls. No chest pain.   01/20/2017 - the patient is coming after 4 days, at the last visit she has complained of involuntary muscle twitching and dizziness. She was found to be hypotensive, with hypokalemia and worsening Crea (acute on chronic kidney failure). She was advised to hold lasix, hydralazine, decrease lisinopril from 40 -> 20 mg po daily.  Today she reports improvement of both dizziness and muscle twitching. She has more energy. Her LE edema has resolved.  02/11/2017 - 3 week follow up, the patient has developed LE edema, PND and SOB again, The last night she also felt chest pain, non-exertional. She stopped taking lasix as she was instructed. Her LE cramping has resolved. She is up 6 lbs.  02/17/17 - seen by a PA, felt better on increased dose of Lasix. She lost 8 pounds  (171-->163lb). Lais xwas decreased to 40 mg po daily and spironolactone to 25 mg  po daily.  03/27/2017 - 1 months follow-up, patient doesn't feel well she feels profoundly tired she has felt in the last 2 weeks 3 times, her blood pressure measurements at home or from 66 to 58N systolic. She also has been experiencing muscle twitching and significant shoulder and neck pain. She denies any chest pain or shortness of breath no lower extremity edema orthopnea or paroxysmal nocturnal dyspnea. She denies any bleeding.   Past Medical History:  Diagnosis Date  . Breast cancer ([place])   . CAD (coronary artery  disease)    a. s/p DESx2 to mid and distal RCA in 2010.  Marland Kitchen Cholelithiasis 09/12/2013   Lap Chole on 09/14/13   . Chronic diastolic CHF (congestive heart failure) ([place])    a. Echo 06/09/15: EF 55-60%, normal wall motion, mild AI, mild to moderate MR, mild LAE, mildly reduced RVSF, mild RAE, moderate TR, PASP 53 mmHg  . CKD (chronic kidney disease), stage III   . Diabetes mellitus    NON INSULIN DEPENDENT  . DJD (degenerative joint disease)   . GERD (gastroesophageal reflux disease)   . Hemorrhoids   . History of diabetic neuropathy   . Hypercholesterolemia   . Hypertension   . Hypertensive heart disease   . Paroxysmal atrial fibrillation (HCC)    a. discovered on PPM interrogation 12/15, CHAD2VASC score is 6. b. decompensation with dCHF in 06/2015 possibly due to AF, s/p DCCV.  . SSS (sick sinus syndrome) ([place])    a. s/p Medtronic PPM by JA for SSS and syncope 9/11.    Past Surgical History:  Procedure Laterality Date  . CARDIAC CATHETERIZATION  07/05/2009   EF 55-60%  . CARDIOVERSION N/A 06/11/2015   Procedure: CARDIOVERSION;  Surgeon: [name], MD;  [facility] ENDOSCOPY;  Service: Cardiovascular;  Laterality: N/A;  . CHOLECYSTECTOMY N/A 09/14/2013   Procedure: LAPAROSCOPIC CHOLECYSTECTOMY WITH INTRAOPERATIVE CHOLANGIOGRAM;  Surgeon: [name], MD;  Location: [place];  Service: General;  Laterality: N/A;  . COLONOSCOPY    . ERCP N/A 09/13/2013   Procedure: ENDOSCOPIC RETROGRADE CHOLANGIOPANCREATOGRAPHY (ERCP);  Surgeon: [name], MD;  Location: [facility] ENDOSCOPY;  Service: Endoscopy;  Laterality: N/A;  . INSERT / REPLACE / REMOVE PACEMAKER  9/11   SSS and syncope, implanted by JA (MDT)  . MASTECTOMY  1992   BILATERAL WITH RECONSTRUCTION    Current Medications: Current Outpatient Prescriptions  Medication Sig Dispense Refill  . apixaban (ELIQUIS) 5 MG TABS tablet Take 5 mg by mouth 2 (two) times daily.    Marland Kitchen b complex vitamins tablet Take 1 tablet by mouth daily.     . calcium carbonate (OS-CAL) 600 MG TABS tablet Take 600 mg by mouth daily with breakfast.     . carvedilol (COREG) 25 MG tablet TAKE ONE TABLET BY MOUTH TWICE DAILY. 60 tablet 2  . Cyanocobalamin (VITAMIN B12 PO) Take 1 tablet by mouth daily.     . fish oil-omega-3 fatty acids 1000 MG capsule Take 1 g by mouth every morning.     . furosemide (LASIX) 40 MG tablet Take 1 tablet (40 mg total) by mouth 2 (two) times daily. (Patient taking differently: Take 40 mg by mouth daily per phone instructions. ) 180 tablet 1  . gabapentin (NEURONTIN) 100 MG capsule Take 1 capsule (100 mg total) by mouth at bedtime. Take in addition to the 300 mg cap you are already taking at night 90 capsule 3  . gabapentin (NEURONTIN) 300 MG capsule TAKE ONE CAPSULE BY  MOUTH TWICE DAILY 60 capsule 5  . hydrALAZINE (APRESOLINE) 25 MG tablet TAKE ONE TABLET BY MOUTH EVERY 8 HOURS - (taking 2 tabs q8hr per recent phone instructions) 270 tablet 2  . lisinopril (PRINIVIL,ZESTRIL) 20 MG tablet Take 1 tablet (20 mg total) by mouth 2 (two) times daily. 180 tablet 3  . metFORMIN (GLUCOPHAGE) 500 MG tablet TAKE ONE TABLET BY MOUTH ONCE DAILY WITH BREAKFAST 90 tablet 3  . nitroGLYCERIN (NITROSTAT) 0.4 MG SL tablet Place 1 tablet (0.4 mg total) under the tongue every 5 (five) minutes as needed for chest pain. x3 doses as needed for chest pain 25 tablet prn  . polyethylene glycol (MIRALAX / GLYCOLAX) packet Take 17 g by mouth daily as needed for moderate constipation.    . rosuvastatin (CRESTOR) 10 MG tablet TAKE ONE TABLET BY MOUTH ONCE DAILY IN THE MORNING 90 tablet 3   No current facility-administered medications for this visit.      Allergies:   Lyrica [pregabalin]; Amlodipine; and Sulfonamide derivatives   Social History   Social History  . Marital status: Widowed    Spouse name: N/A  . Number of children: N/A  . Years of education: N/A   Social History Main Topics  . Smoking status: Never Smoker  . Smokeless tobacco:  Never Used  . Alcohol use No  . Drug use: No  . Sexual activity: Not Asked   Other Topics Concern  . None   Social History Narrative  . None     Family History:  The patient's family history includes Cancer in her father and mother.   ROS:   Please see the history of present illness. Otherwise, review of systems is positive for insomnia. All other systems are reviewed and otherwise negative.    PHYSICAL EXAM:   VS:  BP (!) 90/42   Pulse 68   Ht 5\' 3"  (1.6 m)   Wt 158 lb (71.7 kg)   LMP  (LMP Unknown)   BMI 27.99 kg/m   BMI: Body mass index is 27.99 kg/m. GEN: Well nourished, appears uncomfortable HEENT: normocephalic, atraumatic Neck: no JVD, carotid bruits, or masses Cardiac: RRR; no murmurs, rubs, or gallops, no LE edema,  Respiratory: CTA, normal work of breathing GI: rounded but soft, nontender, + BS MS: no deformity or atrophy  Skin: warm and dry, no rash Neuro:  Alert and Oriented x 3, Strength and sensation are intact, follows commands Psych: euthymic mood, full affect  Wt Readings from Last 3 Encounters:  03/27/17 158 lb (71.7 kg)  02/17/17 163 lb (73.9 kg)  02/11/17 171 lb (77.6 kg)      Studies/Labs Reviewed:   EKG:  EKG was not ordered today.  Recent Labs: 09/18/2016: Brain Natriuretic Peptide 283.0; Hemoglobin 11.8 10/01/2016: ALT 10 01/16/2017: Platelets 222 02/11/2017: Magnesium 2.1; TSH 3.500 02/17/2017: BUN 19; Creatinine, Ser 1.19; NT-Pro BNP 4,735; Potassium 3.8; Sodium 145   Lipid Panel    Component Value Date/Time   CHOL 101 06/09/2015 0330   TRIG 52 06/09/2015 0330   HDL 38 (L) 06/09/2015 0330   CHOLHDL 2.7 06/09/2015 0330   VLDL 10 06/09/2015 0330   LDLCALC 53 06/09/2015 0330   LDLDIRECT 152.0 10/10/2013 1619    Additional studies/ records that were reviewed today include: Summarized above.    ASSESSMENT & PLAN:   1. Acute on chronic diastolic/ Hypertensive heart disease  - she is down 13 lbs in the last 6 weeks - she is  profoundly hypotensive with muscle pain  and twitching, she has prior h/o acute kidney failure with hypokalemia and muscle cramping with diuretics - she is offered to go to the hospital, she would prefer not to go - we will obtain stat CMP, CBC - hold lasix, spironolactone, hydralazine - decrease lisinopril 10 -> 2.5 mg po daily and carvedilol 25 -> 6.25 mg po BID - I am on call tonight and the entire weekend, I will call her later today with lab results and call her over the weekend, if her condition deteriorates she agrees to come to the hospital.  2. CKD stage III - as above  3. HTN - now severe symptomatic hypotension, management as above  4. Paroxysmal atrial fib - low burden by recent interrogation of pacemaker. Continue Eliquis - the dose was decreased with decreasing GFR, we will have to reconsider continuation with frequent fall in the last few weeks.   5. CAD- she is not describing any recurrent symptoms of angina. Continue BB, statin. Not on ASA due to ongoing Eliquis use.  Disposition: F/u in 1 week with a PA.  Medication Adjustments/Labs and Tests Ordered: Current medicines are reviewed at length with the patient today.  Concerns regarding medicines are outlined above. Medication changes, Labs and Tests ordered today are summarized above and listed in the Patient Instructions accessible in Encounters.   Signed, Ena Dawley, MD 03/27/2017 10:52 AM    [facility] [place], [place], [place]  [address] Phone: [phone]; Fax: [phone]

## 2017-03-27 NOTE — Telephone Encounter (Addendum)
Notified the pt and Daughter of Dr Francesca Oman recommendations based on the pts abnormal and worsening creatinine.  Advised the pts Daughter that Dr [name] wants the pt and highly encourages the pt, to increase her PO fluid intake over the weekend.  Informed the pts daughter that if the pt starts to deteriorate (e.g. AMS, becomes more lethargic, not able to make needs known, sleeps more, has increased muscle twitching), then she should immediately take the pt to the ER for further evaluation.  Informed the pts daughter that Dr [name] would like for the pt to come back in for repeat lab on next Tuesday 5/29 to recheck a bmet, and come in for her scheduled follow-up appt with Cecilie Kicks NP on next Thursday 5/31.  Informed the pts daughter that Dr [name] will check in with her and the pt this Sunday, to see how she's doing.  Daughter verbalized understanding and agrees with this plan.

## 2017-03-27 NOTE — Patient Instructions (Signed)
Medication Instructions:   --DO NOT TAKE ANYMORE MEDICATIONS TODAY 03/27/17 PER DR [name].--  STOP TAKING HYDRALAZINE NOW  STOP TAKING LASIX NOW  STOP TAKING SPIRONOLACTONE NOW  DECREASE YOUR LISINOPRIL TO 2.5 MG ONCE DAILY  DECREASE YOUR CARVEDILOL TO 6.25 MG TWICE DAILY    Labwork:  TODAY---STAT LABS TO BE DRAWN--STAT CMET, STAT CBC W DIFF, AND STAT PRO-BNP     Follow-Up:  NEXT WEEK WITH AN EXTENDER IN OUR OFFICE.  CAN BE ON CARE TEAM OR WITH ANY EXTENDER PER DR NELSON      ---PLEASE REMEMBER TO NOT TAKE ANYMORE MEDICATIONS TODAY 5/25.  YOU MAY START BACK TAKING YOUR MEDS TOMORROW 5/26, PER DR NELSON---      If you need a refill on your cardiac medications before your next appointment, please call your pharmacy.

## 2017-03-27 NOTE — Telephone Encounter (Signed)
-----   Message from [name], MD sent at 03/27/2017  5:17 PM EDT ----- Labs reviewed, there is significant worsening of creatinine, patient was offered to return to the hospital but would prefer to be at home, she'll be instructed to do well over the weekend and go to the ER if her condition deteriorates. We will call her this weekend to check on her in follow-up in clinic with labs on Tuesday.

## 2017-03-28 ENCOUNTER — Encounter (HOSPITAL_COMMUNITY): Payer: Self-pay | Admitting: Emergency Medicine

## 2017-03-28 ENCOUNTER — Observation Stay (HOSPITAL_COMMUNITY)
Admission: EM | Admit: 2017-03-28 | Discharge: 2017-03-31 | Disposition: A | Discharge status: Home / Self Care | Payer: Medicare Other | Attending: Internal Medicine | Admitting: Internal Medicine

## 2017-03-28 ENCOUNTER — Emergency Department (HOSPITAL_COMMUNITY): Payer: Medicare Other

## 2017-03-28 DIAGNOSIS — I10 Essential (primary) hypertension: Secondary | ICD-10-CM | POA: Diagnosis present

## 2017-03-28 DIAGNOSIS — Z809 Family history of malignant neoplasm, unspecified: Secondary | ICD-10-CM | POA: Diagnosis not present

## 2017-03-28 DIAGNOSIS — I160 Hypertensive urgency: Secondary | ICD-10-CM | POA: Diagnosis present

## 2017-03-28 DIAGNOSIS — N184 Chronic kidney disease, stage 4 (severe): Secondary | ICD-10-CM | POA: Diagnosis not present

## 2017-03-28 DIAGNOSIS — Z9013 Acquired absence of bilateral breasts and nipples: Secondary | ICD-10-CM | POA: Insufficient documentation

## 2017-03-28 DIAGNOSIS — W19XXXA Unspecified fall, initial encounter: Secondary | ICD-10-CM | POA: Diagnosis not present

## 2017-03-28 DIAGNOSIS — E1140 Type 2 diabetes mellitus with diabetic neuropathy, unspecified: Secondary | ICD-10-CM

## 2017-03-28 DIAGNOSIS — I480 Paroxysmal atrial fibrillation: Secondary | ICD-10-CM | POA: Insufficient documentation

## 2017-03-28 DIAGNOSIS — E119 Type 2 diabetes mellitus without complications: Secondary | ICD-10-CM

## 2017-03-28 DIAGNOSIS — N170 Acute kidney failure with tubular necrosis: Secondary | ICD-10-CM

## 2017-03-28 DIAGNOSIS — I959 Hypotension, unspecified: Secondary | ICD-10-CM | POA: Diagnosis not present

## 2017-03-28 DIAGNOSIS — M25572 Pain in left ankle and joints of left foot: Secondary | ICD-10-CM | POA: Insufficient documentation

## 2017-03-28 DIAGNOSIS — Z7984 Long term (current) use of oral hypoglycemic drugs: Secondary | ICD-10-CM | POA: Insufficient documentation

## 2017-03-28 DIAGNOSIS — E1122 Type 2 diabetes mellitus with diabetic chronic kidney disease: Secondary | ICD-10-CM | POA: Diagnosis not present

## 2017-03-28 DIAGNOSIS — I5043 Acute on chronic combined systolic (congestive) and diastolic (congestive) heart failure: Secondary | ICD-10-CM | POA: Diagnosis not present

## 2017-03-28 DIAGNOSIS — N179 Acute kidney failure, unspecified: Secondary | ICD-10-CM | POA: Diagnosis not present

## 2017-03-28 DIAGNOSIS — B9689 Other specified bacterial agents as the cause of diseases classified elsewhere: Secondary | ICD-10-CM | POA: Diagnosis not present

## 2017-03-28 DIAGNOSIS — E785 Hyperlipidemia, unspecified: Secondary | ICD-10-CM | POA: Diagnosis not present

## 2017-03-28 DIAGNOSIS — I130 Hypertensive heart and chronic kidney disease with heart failure and stage 1 through stage 4 chronic kidney disease, or unspecified chronic kidney disease: Secondary | ICD-10-CM | POA: Diagnosis not present

## 2017-03-28 DIAGNOSIS — Z7901 Long term (current) use of anticoagulants: Secondary | ICD-10-CM | POA: Insufficient documentation

## 2017-03-28 DIAGNOSIS — R531 Weakness: Secondary | ICD-10-CM

## 2017-03-28 DIAGNOSIS — I083 Combined rheumatic disorders of mitral, aortic and tricuspid valves: Secondary | ICD-10-CM | POA: Insufficient documentation

## 2017-03-28 DIAGNOSIS — I2510 Atherosclerotic heart disease of native coronary artery without angina pectoris: Secondary | ICD-10-CM | POA: Diagnosis not present

## 2017-03-28 DIAGNOSIS — Z853 Personal history of malignant neoplasm of breast: Secondary | ICD-10-CM | POA: Insufficient documentation

## 2017-03-28 DIAGNOSIS — N390 Urinary tract infection, site not specified: Secondary | ICD-10-CM | POA: Diagnosis not present

## 2017-03-28 DIAGNOSIS — Z79899 Other long term (current) drug therapy: Secondary | ICD-10-CM | POA: Insufficient documentation

## 2017-03-28 DIAGNOSIS — Z955 Presence of coronary angioplasty implant and graft: Secondary | ICD-10-CM | POA: Insufficient documentation

## 2017-03-28 DIAGNOSIS — Z882 Allergy status to sulfonamides status: Secondary | ICD-10-CM | POA: Insufficient documentation

## 2017-03-28 DIAGNOSIS — R7989 Other specified abnormal findings of blood chemistry: Secondary | ICD-10-CM | POA: Insufficient documentation

## 2017-03-28 DIAGNOSIS — R296 Repeated falls: Secondary | ICD-10-CM | POA: Insufficient documentation

## 2017-03-28 DIAGNOSIS — K219 Gastro-esophageal reflux disease without esophagitis: Secondary | ICD-10-CM | POA: Diagnosis not present

## 2017-03-28 DIAGNOSIS — R52 Pain, unspecified: Secondary | ICD-10-CM

## 2017-03-28 DIAGNOSIS — E860 Dehydration: Secondary | ICD-10-CM | POA: Diagnosis not present

## 2017-03-28 DIAGNOSIS — M1710 Unilateral primary osteoarthritis, unspecified knee: Secondary | ICD-10-CM | POA: Insufficient documentation

## 2017-03-28 DIAGNOSIS — I9589 Other hypotension: Secondary | ICD-10-CM

## 2017-03-28 DIAGNOSIS — M79662 Pain in left lower leg: Secondary | ICD-10-CM | POA: Diagnosis not present

## 2017-03-28 DIAGNOSIS — I110 Hypertensive heart disease with heart failure: Secondary | ICD-10-CM

## 2017-03-28 DIAGNOSIS — E7800 Pure hypercholesterolemia, unspecified: Secondary | ICD-10-CM | POA: Diagnosis not present

## 2017-03-28 DIAGNOSIS — Z888 Allergy status to other drugs, medicaments and biological substances status: Secondary | ICD-10-CM | POA: Insufficient documentation

## 2017-03-28 DIAGNOSIS — N183 Chronic kidney disease, stage 3 (moderate): Secondary | ICD-10-CM

## 2017-03-28 DIAGNOSIS — N189 Chronic kidney disease, unspecified: Secondary | ICD-10-CM

## 2017-03-28 DIAGNOSIS — Z950 Presence of cardiac pacemaker: Secondary | ICD-10-CM | POA: Insufficient documentation

## 2017-03-28 DIAGNOSIS — I495 Sick sinus syndrome: Secondary | ICD-10-CM

## 2017-03-28 LAB — GLUCOSE, CAPILLARY
GLUCOSE-CAPILLARY: 114 mg/dL — AB (ref 65–99)
Glucose-Capillary: 124 mg/dL — ABNORMAL HIGH (ref 65–99)

## 2017-03-28 LAB — BASIC METABOLIC PANEL
ANION GAP: 8 (ref 5–15)
BUN: 64 mg/dL — ABNORMAL HIGH (ref 6–20)
CALCIUM: 9.9 mg/dL (ref 8.9–10.3)
CO2: 22 mmol/L (ref 22–32)
CREATININE: 2.54 mg/dL — AB (ref 0.44–1.00)
Chloride: 104 mmol/L (ref 101–111)
GFR, EST AFRICAN AMERICAN: 18 mL/min — AB (ref >60)
GFR, EST NON AFRICAN AMERICAN: 16 mL/min — AB (ref >60)
GLUCOSE: 109 mg/dL — AB (ref 65–99)
Potassium: 5 mmol/L (ref 3.5–5.1)
Sodium: 134 mmol/L — ABNORMAL LOW (ref 135–145)

## 2017-03-28 LAB — URINALYSIS, ROUTINE W REFLEX MICROSCOPIC
Bilirubin Urine: NEGATIVE
GLUCOSE, UA: NEGATIVE mg/dL
HGB URINE DIPSTICK: NEGATIVE
Ketones, ur: NEGATIVE mg/dL
NITRITE: NEGATIVE
PH: 6 (ref 5.0–8.0)
PROTEIN: NEGATIVE mg/dL
RBC / HPF: NONE SEEN RBC/hpf (ref 0–5)
SPECIFIC GRAVITY, URINE: 1.006 (ref 1.005–1.030)

## 2017-03-28 LAB — MAGNESIUM: MAGNESIUM: 2.3 mg/dL (ref 1.7–2.4)

## 2017-03-28 LAB — I-STAT TROPONIN, ED: Troponin i, poc: 0.02 ng/mL (ref 0.00–0.08)

## 2017-03-28 LAB — CBC
HCT: 36.5 % (ref 36.0–46.0)
Hemoglobin: 12.1 g/dL (ref 12.0–15.0)
MCH: 29.1 pg (ref 26.0–34.0)
MCHC: 33.2 g/dL (ref 30.0–36.0)
MCV: 87.7 fL (ref 78.0–100.0)
PLATELETS: 205 10*3/uL (ref 150–400)
RBC: 4.16 MIL/uL (ref 3.87–5.11)
RDW: 15.5 % (ref 11.5–15.5)
WBC: 8.2 10*3/uL (ref 4.0–10.5)

## 2017-03-28 LAB — BRAIN NATRIURETIC PEPTIDE: B NATRIURETIC PEPTIDE 5: 772.5 pg/mL — AB (ref 0.0–100.0)

## 2017-03-28 LAB — CK: CK TOTAL: 71 U/L (ref 38–234)

## 2017-03-28 LAB — PHOSPHORUS: PHOSPHORUS: 3.5 mg/dL (ref 2.5–4.6)

## 2017-03-28 MED ORDER — ONDANSETRON HCL 4 MG PO TABS
4.0000 mg | ORAL_TABLET | Freq: Four times a day (QID) | ORAL | Status: DC | PRN
Start: 2017-03-28 — End: 2017-03-31

## 2017-03-28 MED ORDER — APIXABAN 2.5 MG PO TABS
2.5000 mg | ORAL_TABLET | Freq: Two times a day (BID) | ORAL | Status: DC
  Administered 2017-03-28 – 2017-03-31 (×6): 2.5 mg via ORAL
  Filled 2017-03-28 (×6): qty 1

## 2017-03-28 MED ORDER — DEXTROSE 5 % IV SOLN
1.0000 g | Freq: Once | INTRAVENOUS | Status: AC
  Administered 2017-03-28: 1 g via INTRAVENOUS
  Filled 2017-03-28: qty 10

## 2017-03-28 MED ORDER — SODIUM CHLORIDE 0.9% FLUSH
3.0000 mL | Freq: Two times a day (BID) | INTRAVENOUS | Status: DC
  Administered 2017-03-28 – 2017-03-31 (×5): 3 mL via INTRAVENOUS

## 2017-03-28 MED ORDER — ACETAMINOPHEN 650 MG RE SUPP: 650.0000 mg | Freq: Four times a day (QID) | RECTAL | Status: DC | PRN

## 2017-03-28 MED ORDER — DEXTROSE 5 % IV SOLN
1.0000 g | INTRAVENOUS | Status: DC
  Administered 2017-03-29 – 2017-03-30 (×2): 1 g via INTRAVENOUS
  Filled 2017-03-28 (×3): qty 10

## 2017-03-28 MED ORDER — ONDANSETRON HCL 4 MG/2ML IJ SOLN: 4.0000 mg | Freq: Four times a day (QID) | INTRAMUSCULAR | Status: DC | PRN

## 2017-03-28 MED ORDER — GABAPENTIN 300 MG PO CAPS
300.0000 mg | ORAL_CAPSULE | Freq: Two times a day (BID) | ORAL | Status: DC
  Administered 2017-03-28 – 2017-03-31 (×6): 300 mg via ORAL
  Filled 2017-03-28 (×6): qty 1

## 2017-03-28 MED ORDER — ACETAMINOPHEN 325 MG PO TABS
650.0000 mg | ORAL_TABLET | Freq: Four times a day (QID) | ORAL | Status: DC | PRN
Start: 2017-03-28 — End: 2017-03-31
  Administered 2017-03-30: 650 mg via ORAL
  Filled 2017-03-28: qty 2

## 2017-03-28 MED ORDER — NITROGLYCERIN 0.4 MG SL SUBL: 0.4000 mg | SUBLINGUAL_TABLET | SUBLINGUAL | Status: DC | PRN

## 2017-03-28 MED ORDER — SENNOSIDES-DOCUSATE SODIUM 8.6-50 MG PO TABS: 1.0000 | ORAL_TABLET | Freq: Every evening | ORAL | Status: DC | PRN

## 2017-03-28 MED ORDER — CARVEDILOL 6.25 MG PO TABS
6.2500 mg | ORAL_TABLET | Freq: Two times a day (BID) | ORAL | Status: DC
  Administered 2017-03-29: 6.25 mg via ORAL
  Filled 2017-03-28: qty 1

## 2017-03-28 MED ORDER — INSULIN ASPART 100 UNIT/ML ~~LOC~~ SOLN
0.0000 [IU] | Freq: Three times a day (TID) | SUBCUTANEOUS | Status: DC
  Administered 2017-03-29: 1 [IU] via SUBCUTANEOUS
  Administered 2017-03-29 – 2017-03-30 (×2): 2 [IU] via SUBCUTANEOUS
  Administered 2017-03-30: 1 [IU] via SUBCUTANEOUS

## 2017-03-28 MED ORDER — SODIUM CHLORIDE 0.9 % IV SOLN: INTRAVENOUS | Status: DC

## 2017-03-28 MED ORDER — SODIUM CHLORIDE 0.9 % IV BOLUS (SEPSIS)
500.0000 mL | Freq: Once | INTRAVENOUS | Status: AC
  Administered 2017-03-28: 500 mL via INTRAVENOUS

## 2017-03-28 MED ORDER — SODIUM CHLORIDE 0.9 % IV SOLN
INTRAVENOUS | Status: DC
  Administered 2017-03-28: 50 mL/h via INTRAVENOUS

## 2017-03-28 MED ORDER — ROSUVASTATIN CALCIUM 10 MG PO TABS
10.0000 mg | ORAL_TABLET | Freq: Every day | ORAL | Status: DC
  Administered 2017-03-29 – 2017-03-31 (×3): 10 mg via ORAL
  Filled 2017-03-28 (×3): qty 1

## 2017-03-28 NOTE — ED Notes (Signed)
Pt transported to xray 

## 2017-03-28 NOTE — ED Provider Notes (Signed)
[facility] DEPT Provider Note   CSN: [id number] Arrival date & time: 03/28/17  1143     History   Chief Complaint Chief Complaint  Patient presents with  . Weakness    HPI SAMYA SICILIANO is a 83 y.o. female.  The history is provided by the patient and medical records.  Weakness     83 year old female with history of coronary artery disease status post stenting 2, congestive heart failure, chronic kidney disease, diabetes, GERD, hyperlipidemia, hypertension, paroxysmal A. fib on Eliquis, SSS s/p pacemaker placement, presenting to the ED for generalized weakness. Daughter reports this is been ongoing for a few weeks now. States they have been following with cardiology closely as patient has gone between periods of dehydration and fluid overload so has had multiple medication adjustments.  States patient has fallen twice over the past 5 days-- daughter states it is more like her legs give out and she has controlled falls to the floor.  There has been no head injury or LOC.  She was seen at cardiology office yesterday with her physicain, Dr. [name].  She did have some lab work done which revealed a creatinine of 3.3 (baseline is around 1.1).  It was recommended that patient be admitted, however she wanted to go home. States she was told to push oral fluids, was taken off of her diuretics. Daughter reports her blood pressure has been somewhat low and she is still been increasingly weak. States she is also having some left ankle pain, unsure if she injured this yesterday while she fell. Patient is anticoagulated with Eliquis.  Denies any chest pain or SOB at present.  Past Medical History:  Diagnosis Date  . Breast cancer ([place])   . CAD (coronary artery disease)    a. s/p DESx2 to mid and distal RCA in 2010.  Marland Kitchen Cholelithiasis 09/12/2013   Lap Chole on 09/14/13   . Chronic diastolic CHF (congestive heart failure) ([place])    a. Echo 06/09/15: EF 55-60%, normal wall motion, mild AI, mild to  moderate MR, mild LAE, mildly reduced RVSF, mild RAE, moderate TR, PASP 53 mmHg  . CKD (chronic kidney disease), stage III   . Diabetes mellitus    NON INSULIN DEPENDENT  . DJD (degenerative joint disease)   . GERD (gastroesophageal reflux disease)   . Hemorrhoids   . History of diabetic neuropathy   . Hypercholesterolemia   . Hypertension   . Hypertensive heart disease   . Paroxysmal atrial fibrillation ([facility])    a. discovered on PPM interrogation 12/15, CHAD2VASC score is 6. b. decompensation with dCHF in 06/2015 possibly due to AF, s/p DCCV.  . SSS (sick sinus syndrome) ([place])    a. s/p Medtronic PPM by JA for SSS and syncope 9/11.    Patient Active Problem List   Diagnosis Date Noted  . Dehydration 03/27/2017  . Elevated serum creatinine 03/27/2017  . Diastolic dysfunction with acute on chronic heart failure ([place]) 05/15/2016  . Acute on chronic diastolic CHF (congestive heart failure), NYHA class 2 ([place]) 05/15/2016  . Bilateral leg edema 04/29/2016  . AF (paroxysmal atrial fibrillation) ([place]) 06/11/2015  . CKD (chronic kidney disease) stage 3, GFR 30-59 ml/min 06/10/2015  . CAD (coronary artery disease) 06/09/2015  . Diabetic neuropathy ([place]) 07/06/2013  . SSS (sick sinus syndrome) ([place]) 02/10/2013  . Hypertension 07/30/2012  . Pacemaker-Medtronic 07/19/2012  . Benign hypertensive heart disease with heart failure ([place]) 01/28/2012  . Diabetes ([place]) 10/22/2010  . HYPERCHOLESTEROLEMIA 10/22/2010  . Angina  pectoris ([facility]) 10/22/2010  . DEGENERATIVE JOINT DISEASE 10/22/2010    Past Surgical History:  Procedure Laterality Date  . CARDIAC CATHETERIZATION  07/05/2009   EF 55-60%  . CARDIOVERSION N/A 06/11/2015   Procedure: CARDIOVERSION;  Surgeon: Katarina H Nelson, MD;  Location: [facility] ENDOSCOPY;  Service: Cardiovascular;  Laterality: N/A;  . CHOLECYSTECTOMY N/A 09/14/2013   Procedure: LAPAROSCOPIC CHOLECYSTECTOMY WITH INTRAOPERATIVE CHOLANGIOGRAM;  Surgeon: Thomas A. Cornett, MD;   Location: [facility] OR;  Service: General;  Laterality: N/A;  . COLONOSCOPY    . ERCP N/A 09/13/2013   Procedure: ENDOSCOPIC RETROGRADE CHOLANGIOPANCREATOGRAPHY (ERCP);  Surgeon: Patrick D Hung, MD;  Location: [facility] ENDOSCOPY;  Service: Endoscopy;  Laterality: N/A;  . INSERT / REPLACE / REMOVE PACEMAKER  9/11   SSS and syncope, implanted by JA (MDT)  . MASTECTOMY  1992   BILATERAL WITH RECONSTRUCTION    OB History    No data available       Home Medications    Prior to Admission medications   Medication Sig Start Date End Date Taking? Authorizing Provider  apixaban (ELIQUIS) 2.5 MG TABS tablet Take 1 tablet (2.5 mg total) by mouth 2 (two) times daily. 01/16/17   Dunn, Dayna N, PA-C  b complex vitamins tablet Take 1 tablet by mouth daily.    [provider]  blood glucose meter kit and supplies KIT Dispense based on patient and insurance preference. Test sugars once daily and prn as directed. (FOR ICD-9 250.00, 250.01). 10/21/16   Burns, Stacy J, MD  carvedilol (COREG) 6.25 MG tablet Take 1 tablet (6.25 mg total) by mouth 2 (two) times daily. 03/27/17   Nelson, Katarina H, MD  ECHINACEA PO Take 750 mg by mouth daily.    [provider]  fish oil-omega-3 fatty acids 1000 MG capsule Take 1 g by mouth every morning.     [provider]  gabapentin (NEURONTIN) 100 MG capsule Take 1 capsule (100 mg total) by mouth at bedtime. Take in addition to the 300 mg cap you are already taking at night 04/29/16   Burns, Stacy J, MD  gabapentin (NEURONTIN) 300 MG capsule TAKE ONE CAPSULE BY MOUTH TWICE DAILY 01/27/17   Burns, Stacy J, MD  lisinopril (PRINIVIL,ZESTRIL) 2.5 MG tablet Take 1 tablet (2.5 mg total) by mouth daily. 03/27/17 06/25/17  Nelson, Katarina H, MD  metFORMIN (GLUCOPHAGE) 500 MG tablet TAKE ONE TABLET BY MOUTH ONCE DAILY WITH BREAKFAST 04/29/16   Burns, Stacy J, MD  Misc. Devices (ROLLATOR ULTRA-LIGHT) MISC With seat.  Diabetic neuropathy, knee arthritis 10/21/16   Burns,  Stacy J, MD  nitroGLYCERIN (NITROSTAT) 0.4 MG SL tablet Place 1 tablet (0.4 mg total) under the tongue every 5 (five) minutes as needed for chest pain. x3 doses as needed for chest pain 10/21/16   Burns, Stacy J, MD  rosuvastatin (CRESTOR) 10 MG tablet TAKE ONE TABLET BY MOUTH ONCE DAILY IN THE MORNING 04/29/16   Burns, Stacy J, MD    Family History Family History  Problem Relation Age of Onset  . Cancer Mother   . Cancer Father     Social History Social History  Substance Use Topics  . Smoking status: Never Smoker  . Smokeless tobacco: Never Used  . Alcohol use No     Allergies   Lyrica [pregabalin]; Amlodipine; and Sulfonamide derivatives   Review of Systems Review of Systems  Musculoskeletal: Positive for arthralgias.  Neurological: Positive for weakness.  All other systems reviewed and are negative.    Physical   Exam Updated Vital Signs BP (!) 97/56   Pulse 75   Temp 97.5 F (36.4 C) (Oral)   Resp 18   Ht 5' 3" (1.6 m)   Wt 71.7 kg (158 lb)   LMP  (LMP Unknown)   SpO2 95%   BMI 27.99 kg/m   Physical Exam  Constitutional: She is oriented to person, place, and time. She appears well-developed and well-nourished.  HENT:  Head: Normocephalic and atraumatic.  Mouth/Throat: Oropharynx is clear and moist.  Eyes: Conjunctivae and EOM are normal. Pupils are equal, round, and reactive to light.  Neck: Normal range of motion.  Cardiovascular: Normal rate, regular rhythm and normal heart sounds.   Pulmonary/Chest: Effort normal and breath sounds normal. No respiratory distress. She has no wheezes.  Abdominal: Soft. Bowel sounds are normal. There is no tenderness. There is no rebound.  Musculoskeletal: Normal range of motion.  Some tenderness of left lateral ankle, no acute deformities or swelling noted; foot appears normal  Neurological: She is alert and oriented to person, place, and time.  Some muscle twitches of the hands noted  Skin: Skin is warm and dry.    Psychiatric: She has a normal mood and affect.  Nursing note and vitals reviewed.    ED Treatments / Results  Labs (all labs ordered are listed, but only abnormal results are displayed) Labs Reviewed  BASIC METABOLIC PANEL - Abnormal; Notable for the following:       Result Value   Sodium 134 (*)    Glucose, Bld 109 (*)    BUN 64 (*)    Creatinine, Ser 2.54 (*)    GFR calc non Af Amer 16 (*)    GFR calc Af Amer 18 (*)    All other components within normal limits  URINALYSIS, ROUTINE W REFLEX MICROSCOPIC - Abnormal; Notable for the following:    APPearance CLOUDY (*)    Leukocytes, UA LARGE (*)    Bacteria, UA MANY (*)    Squamous Epithelial / LPF 0-5 (*)    All other components within normal limits  BRAIN NATRIURETIC PEPTIDE - Abnormal; Notable for the following:    B Natriuretic Peptide 772.5 (*)    All other components within normal limits  URINE CULTURE  CBC  I-STAT TROPOININ, ED    EKG  EKG Interpretation  Date/Time:  Saturday Mar 28 2017 12:05:41 EDT Ventricular Rate:  69 PR Interval:    QRS Duration: 90 QT Interval:  402 QTC Calculation: 430 R Axis:   39 Text Interpretation:  Atrial fibrillation with frequent ventricular-paced complexes ST & T wave abnormality, consider inferior ischemia ST & T wave abnormality, consider anterior ischemia Abnormal ECG Confirmed by RAY MD, Andee Poles [phone]) on 03/28/2017 1:24:59 PM       Radiology Dg Chest 2 View  Result Date: 03/28/2017 CLINICAL DATA:  Generalized weakness and low blood pressure. Fall yesterday. EXAM: CHEST  2 VIEW COMPARISON:  June 08, 2015 FINDINGS: The heart, hila, and mediastinum are unchanged. No pneumothorax. Opacity and probable small effusion in the left base is similar in the interval. No significant change since February 2016. IMPRESSION: No acute interval change. Probable small effusion and atelectasis or scar in the left base. Electronically Signed   By: Dorise Bullion III M.D   On: 03/28/2017  13:41   Dg Ankle Complete Left  Result Date: 03/28/2017 CLINICAL DATA:  Fall yesterday.  Pain. EXAM: LEFT ANKLE COMPLETE - 3+ VIEW COMPARISON:  None. FINDINGS: There is no evidence of  fracture, dislocation, or joint effusion. There is no evidence of arthropathy or other focal bone abnormality. Soft tissues are unremarkable. IMPRESSION: Negative. Electronically Signed   By: David  Williams III M.D   On: 03/28/2017 13:42    Procedures Procedures (including critical care time)  Medications Ordered in ED Medications  cefTRIAXone (ROCEPHIN) 1 g in dextrose 5 % 50 mL IVPB (not administered)  sodium chloride 0.9 % bolus 500 mL (0 mLs Intravenous Stopped 03/28/17 1415)     Initial Impression / Assessment and Plan / ED Course  I have reviewed the triage vital signs and the nursing notes.  Pertinent labs & imaging results that were available during my care of the patient were reviewed by me and considered in my medical decision making (see chart for details).  89-year-old female here with generalized weakness. Seen by cardiologist, Dr. Nelson, yesterday it was found to be dehydrated and hypotensive. Her creatinine at that time was 3.3. She was offered admission but declined. Reports progressive weakness as well as some muscle twitches. Daughter reports this is normal when her kidney function is off. She's not had any further falls. She is now complaining of some left ankle pain. On arrival here patient was hypotensive into the 90s during my exam. This improved with some IV fluids. Workup is remarkable for serum creatinine of 2.54, her baseline is around 1.1. Chest x-ray without any significant edema. Ankle films are negative. Patient has had 500 mL fluid bolus, blood pressure now stable. UA does appear infectious, urine culture sent and will start rocephin. I discussed the results with patient and her daughter at bedside. They informed me that Dr. Nelson will be on-call this weekend, I paged to speak with  her about patient and recommendations for admission.  Have spoken with cardiology, Dr. Smith-- they will consult and provide recommendations about medications/diuresis.  Wants medicine admit.  Discussed with hospitalist service- they will admit.  Final Clinical Impressions(s) / ED Diagnoses   Final diagnoses:  Weakness  Dehydration  Urinary tract infection without hematuria, site unspecified    New Prescriptions New Prescriptions   No medications on file     Sanders, Lisa M, PA-C 03/28/17 1512    Sanders, Lisa M, PA-C 03/28/17 1513    Ray, Danielle, MD 03/30/17 1927  

## 2017-03-28 NOTE — H&P (Signed)
History and Physical    QUANASIA DEFINO DVV:[id number] DOB: 06-14-1927 DOA: 03/28/2017  PCP: Binnie Rail, MD Patient coming from: home  Chief Complaint: general weakness/fall  HPI: Jessica [name] is a 83 y.o. female with medical history significant for CAD status post stenting 2, congestive heart failure, chronic kidney disease stage III, diabetes on oral agents, GERD, hyperlipidemia, hypertension, paroxysmal A. fib on our request, sick sinus syndrome status post pacemaker presents to the emergency department with chief complaint generalized weakness and fall. Initial evaluation reveals acute on chronic kidney disease, dehydration and UTI.  Information is obtained from the patient and the daughter who is at the bedside. Daughter reports patient has been working closely with cardiology regarding hydration. She has gone between episodes of dehydration and fluid overload and has had multiple medication adjustments. Daughter reports patient's blood pressure has been around a lot of side lately. Daughter reports patient has fallen twice in the last 5 days. Daughter reports her legs just "give out". Patient denies dizziness syncope or near-syncope. She denies any loss of consciousness with any of these falls. She denies any head injury with these falls. Patient was seen by cardiology yesterday. Lab work revealed elevated creatinine. Provide recommended admission to the hospital but patient refused. She was instructed to push oral fluids and stop her diuretics. Patient denies chest pain palpitation shortness of breath abdominal pain nausea vomiting diarrhea constipation. She denies any dysuria hematuria for consider urgency. She does report pain in her left foot and ankle with weightbearing.    ED Course: In the emergency department she's afebrile hemodynamically stable and not hypoxic  Review of Systems: As per HPI otherwise all other systems reviewed and are negative.   Ambulatory Status: She lives  at home with her daughter. She uses a walker for ambulation. Several falls recently  Past Medical History:  Diagnosis Date  . Breast cancer ([place])   . CAD (coronary artery disease)    a. s/p DESx2 to mid and distal RCA in 2010.  Marland Kitchen Cholelithiasis 09/12/2013   Lap Chole on 09/14/13   . Chronic diastolic CHF (congestive heart failure) ([place])    a. Echo 06/09/15: EF 55-60%, normal wall motion, mild AI, mild to moderate MR, mild LAE, mildly reduced RVSF, mild RAE, moderate TR, PASP 53 mmHg  . CKD (chronic kidney disease), stage III   . Diabetes mellitus    NON INSULIN DEPENDENT  . DJD (degenerative joint disease)   . GERD (gastroesophageal reflux disease)   . Hemorrhoids   . History of diabetic neuropathy   . Hypercholesterolemia   . Hypertension   . Hypertensive heart disease   . Paroxysmal atrial fibrillation (HCC)    a. discovered on PPM interrogation 12/15, CHAD2VASC score is 6. b. decompensation with dCHF in 06/2015 possibly due to AF, s/p DCCV.  . SSS (sick sinus syndrome) ([place])    a. s/p [employer] PPM by JA for SSS and syncope 9/11.    Past Surgical History:  Procedure Laterality Date  . CARDIAC CATHETERIZATION  07/05/2009   EF 55-60%  . CARDIOVERSION N/A 06/11/2015   Procedure: CARDIOVERSION;  Surgeon: [name], MD;  Location: [facility] ENDOSCOPY;  Service: Cardiovascular;  Laterality: N/A;  . CHOLECYSTECTOMY N/A 09/14/2013   Procedure: LAPAROSCOPIC CHOLECYSTECTOMY WITH INTRAOPERATIVE CHOLANGIOGRAM;  Surgeon: [name], MD;  Location: [place];  Service: General;  Laterality: N/A;  . COLONOSCOPY    . ERCP N/A 09/13/2013   Procedure: ENDOSCOPIC RETROGRADE CHOLANGIOPANCREATOGRAPHY (ERCP);  Surgeon: Tory Emerald  Benson Norway, MD;  Location: [place];  Service: Endoscopy;  Laterality: N/A;  . INSERT / REPLACE / REMOVE PACEMAKER  9/11   SSS and syncope, implanted by JA (MDT)  . MASTECTOMY  1992   BILATERAL WITH RECONSTRUCTION    Social History   Social History  . Marital status:  Widowed    Spouse name: N/A  . Number of children: N/A  . Years of education: N/A   Occupational History  . Not on file.   Social History Main Topics  . Smoking status: Never Smoker  . Smokeless tobacco: Never Used  . Alcohol use No  . Drug use: No  . Sexual activity: Not on file   Other Topics Concern  . Not on file   Social History Narrative  . No narrative on file    Allergies  Allergen Reactions  . Lyrica [Pregabalin] Swelling    wgt gain  . Amlodipine Swelling  . Sulfonamide Derivatives Rash    Family History  Problem Relation Age of Onset  . Cancer Mother   . Cancer Father     Prior to Admission medications   Medication Sig Start Date End Date Taking? Authorizing Provider  apixaban (ELIQUIS) 2.5 MG TABS tablet Take 1 tablet (2.5 mg total) by mouth 2 (two) times daily. 01/16/17  Yes Dunn, Dayna N, PA-C  b complex vitamins tablet Take 1 tablet by mouth daily.   Yes [provider]  fish oil-omega-3 fatty acids 1000 MG capsule Take 1 g by mouth every morning.    Yes [provider]  spironolactone (ALDACTONE) 25 MG tablet Take 25 mg by mouth daily.   Yes [provider]  blood glucose meter kit and supplies KIT Dispense based on patient and insurance preference. Test sugars once daily and prn as directed. (FOR ICD-9 250.00, 250.01). 10/21/16   Binnie Rail, MD  carvedilol (COREG) 6.25 MG tablet Take 1 tablet (6.25 mg total) by mouth 2 (two) times daily. 03/27/17   [name], MD  ECHINACEA PO Take 750 mg by mouth daily.    [provider]  gabapentin (NEURONTIN) 100 MG capsule Take 1 capsule (100 mg total) by mouth at bedtime. Take in addition to the 300 mg cap you are already taking at night 04/29/16   Binnie Rail, MD  gabapentin (NEURONTIN) 300 MG capsule TAKE ONE CAPSULE BY MOUTH TWICE DAILY 01/27/17   Binnie Rail, MD  lisinopril (PRINIVIL,ZESTRIL) 2.5 MG tablet Take 1 tablet (2.5 mg total) by mouth daily. 03/27/17  06/25/17  [name], MD  metFORMIN (GLUCOPHAGE) 500 MG tablet TAKE ONE TABLET BY MOUTH ONCE DAILY WITH BREAKFAST 04/29/16   Binnie Rail, MD  Misc. Devices (ROLLATOR ULTRA-LIGHT) MISC With seat.  Diabetic neuropathy, knee arthritis 10/21/16   Binnie Rail, MD  nitroGLYCERIN (NITROSTAT) 0.4 MG SL tablet Place 1 tablet (0.4 mg total) under the tongue every 5 (five) minutes as needed for chest pain. x3 doses as needed for chest pain 10/21/16   Binnie Rail, MD  rosuvastatin (CRESTOR) 10 MG tablet TAKE ONE TABLET BY MOUTH ONCE DAILY IN THE MORNING 04/29/16   Binnie Rail, MD    Physical Exam: Vitals:   03/28/17 1415 03/28/17 1430 03/28/17 1445 03/28/17 1500  BP: 119/60 122/61 117/60 114/60  Pulse: 71 70 60 67  Resp: (!) 24 (!) 23 (!) 22 (!) 23  Temp:      TempSrc:      SpO2: 100% 100% 98%  98%  Weight:      Height:         General:  Appears calm and comfortable, slightly pale somewhat hard of hearing in no acute distress Eyes:  PERRL, EOMI, normal lids, iris ENT:  grossly normal hearing, lips & tongue, mucous membranes of her mouth are pink but dry Neck:  no LAD, masses or thyromegaly Cardiovascular:  Irregularly irregular, no m/r/g. No LE edema. Pedal pulses present and palpable Respiratory:  CTA bilaterally, no w/r/r. Normal respiratory effort. Abdomen:  soft, ntnd, positive bowel sounds throughout no guarding or rebounding Skin:  no rash or induration seen on limited exam Musculoskeletal:  grossly normal tone BUE/BLE, good ROM, no bony abnormality Psychiatric:  grossly normal mood and affect, speech fluent and appropriate, AOx3 Neurologic:  CN 2-12 grossly intact, moves all extremities in coordinated fashion, sensation intact speech clear facial symmetry bilateral grip 5 out of 5 lower extremity strength 5 out of 5  Labs on Admission: I have personally reviewed following labs and imaging studies  CBC:  Recent Labs Lab 03/27/17 1111 03/28/17 1312  WBC 10.5 8.2    NEUTROABS 7.6*  --   HGB  --  12.1  HCT 35.7 36.5  MCV 86 87.7  PLT 207 268   Basic Metabolic Panel:  Recent Labs Lab 03/27/17 1111 03/28/17 1312  NA 136 134*  K 5.0 5.0  CL 105 104  CO2 24 22  GLUCOSE 116* 109*  BUN 68* 64*  CREATININE 3.13* 2.54*  CALCIUM 9.9 9.9   GFR: Estimated Creatinine Clearance: 14.5 mL/min (A) (by C-G formula based on SCr of 2.54 mg/dL (H)). Liver Function Tests:  Recent Labs Lab 03/27/17 1111  AST 15  ALT 13  ALKPHOS 77  BILITOT 0.3  PROT 6.3  ALBUMIN 3.9   No results for input(s): LIPASE, AMYLASE in the last 168 hours. No results for input(s): AMMONIA in the last 168 hours. Coagulation Profile: No results for input(s): INR, PROTIME in the last 168 hours. Cardiac Enzymes: No results for input(s): CKTOTAL, CKMB, CKMBINDEX, TROPONINI in the last 168 hours. BNP (last 3 results)  Recent Labs  02/11/17 1330 02/17/17 1616 03/27/17 1112  PROBNP 7,753* 4,735* 2,510*   HbA1C: No results for input(s): HGBA1C in the last 72 hours. CBG: No results for input(s): GLUCAP in the last 168 hours. Lipid Profile: No results for input(s): CHOL, HDL, LDLCALC, TRIG, CHOLHDL, LDLDIRECT in the last 72 hours. Thyroid Function Tests: No results for input(s): TSH, T4TOTAL, FREET4, T3FREE, THYROIDAB in the last 72 hours. Anemia Panel: No results for input(s): VITAMINB12, FOLATE, FERRITIN, TIBC, IRON, RETICCTPCT in the last 72 hours. Urine analysis:    Component Value Date/Time   COLORURINE YELLOW 03/28/2017 1443   APPEARANCEUR CLOUDY (A) 03/28/2017 1443   LABSPEC 1.006 03/28/2017 1443   PHURINE 6.0 03/28/2017 1443   GLUCOSEU NEGATIVE 03/28/2017 1443   HGBUR NEGATIVE 03/28/2017 1443   BILIRUBINUR NEGATIVE 03/28/2017 1443   KETONESUR NEGATIVE 03/28/2017 1443   PROTEINUR NEGATIVE 03/28/2017 1443   UROBILINOGEN 1.0 09/12/2013 0639   NITRITE NEGATIVE 03/28/2017 1443   LEUKOCYTESUR LARGE (A) 03/28/2017 1443    Creatinine Clearance: Estimated  Creatinine Clearance: 14.5 mL/min (A) (by C-G formula based on SCr of 2.54 mg/dL (H)).  Sepsis Labs: @LABRCNTIP (procalcitonin:4,lacticidven:4) )No results found for this or any previous visit (from the past 240 hour(s)).   Radiological Exams on Admission: Dg Chest 2 View  Result Date: 03/28/2017 CLINICAL DATA:  Generalized weakness and low blood pressure. Fall yesterday. EXAM:  CHEST  2 VIEW COMPARISON:  June 08, 2015 FINDINGS: The heart, hila, and mediastinum are unchanged. No pneumothorax. Opacity and probable small effusion in the left base is similar in the interval. No significant change since February 2016. IMPRESSION: No acute interval change. Probable small effusion and atelectasis or scar in the left base. Electronically Signed   By: Dorise Bullion III M.D   On: 03/28/2017 13:41   Dg Ankle Complete Left  Result Date: 03/28/2017 CLINICAL DATA:  Fall yesterday.  Pain. EXAM: LEFT ANKLE COMPLETE - 3+ VIEW COMPARISON:  None. FINDINGS: There is no evidence of fracture, dislocation, or joint effusion. There is no evidence of arthropathy or other focal bone abnormality. Soft tissues are unremarkable. IMPRESSION: Negative. Electronically Signed   By: Dorise Bullion III M.D   On: 03/28/2017 13:42    EKG: Independently reviewed. Atrial fibrillation with frequent ventricular-paced complexes ST & T wave abnormality, consider inferior ischemia ST & T wave abnormality, consider anterior ischemia  Assessment/Plan Principal Problem:   Acute renal failure superimposed on stage 3 chronic kidney disease (HCC) Active Problems:   Diabetes (HCC)   HYPERCHOLESTEROLEMIA   Benign hypertensive heart disease with heart failure (HCC)   Hypertension   SSS (sick sinus syndrome) (HCC)   CAD (coronary artery disease)   AF (paroxysmal atrial fibrillation) (HCC)   Dehydration   UTI (urinary tract infection)   Hypotension   #1. Acute renal failure superimposed on stage III chronic kidney disease. Likely  related to dehydration secondary to diuresis in the setting of decreased oral intake, hypotension and urinary tract infection. Creatinine 2.54 on admission. Chart review indicates creatinine 3.13 at cardiology office yesterday. Yesterday she was instructed to push by mouth fluids in the stop diuretics. Home medications include spironolactone, lisinopril -Admit -Hold nephrotoxins -Gentle IV fluids -Monitor urine output -Recheck in the morning  #2. Urinary tract infection. Analysis with large leukocytes, WBCs too numerous to count, many bacteria.  -Gentle IV fluids -Continue Rocephin -Monitor urine output  #3. Hypotension. Patient with a history of hypertension resulting in benign hypertensive heart disease with heart failure. Blood pressure has been running low lately according to the family. Home medications include Coreg, lisinopril, spironolactone. In the emergency department blood pressure low end of normal. Of note chart review indicates worsening heart failure. Recent progress note indicates EF 35% and stress test was recommended which the daughter and patient declined. ProBNP 722 today. Chart review also indicates that recently her Lasix spironolactone and hydralazine due to hypotension and dehydration. Her lisinopril was decreased as well as Coreg. -Hold home medications for now -Monitor blood pressure -Cardiology consult for assistance in managing medications/hypertension/fluid status -Daily weights -Intake and output  #4. Chronic diastolic heart failure. See #3.  #5. CAD status post stents in 2010. No chest pain. Initial troponin negative. -Monitor on telemetry -Hold home meds for now -Appreciate cardiology input  #6. Sick sinus syndrome status post pacer in 2011. Indicates patient recently interrogated without issue  #7. Paroxysmal A. fib. EKG as noted above [place] score 4. Home meds include eliquis -Monitor-- -holding beta blocker for now do to #3.   #8. Diabetes. Home  medications include metformin. Serum glucose on admission 109 -Obtain hemoglobin A1c -Hold oral agents for now -Sliding scale insulin for optimal control    DVT prophylaxis: eliquis Code Status: full  Family Communication: daughter at bedside  Disposition Plan: home  Consults called: cardiology  Admission status: obs    Radene Gunning MD [facility]  If 7PM-7AM, please  contact night-coverage [email or web address] Password [facility]  03/28/2017, 3:28 PM

## 2017-03-28 NOTE — Consult Note (Addendum)
The patient has been seen in conjunction with Jessica Kicks, NP-C. All aspects of care have been considered and discussed. The patient has been personally interviewed, examined, and all clinical data has been reviewed.   Volume contraction likely related to diuretic regimen and decreased fluid intake. This has resulted in A/K CKD 4.  Chronic systolic HF is well compensated with no edema or dyspnea.  Hold diuretic. Gentle hydration with NS at 50 cc/hr for 20 hours then DC. Hold potassium supplements.  Hold ACE therapy.  Continue beta blocker.  Follow weight and kidney function closely.  We will follow with you.      Cardiology Consultation:   Patient ID: Jessica [name]; [id number]; 1927/05/11   Admit date: 03/28/2017 Date of Consult: 03/28/2017  Primary Care Provider: Binnie Rail, MD Primary Cardiologist: Dr. [name] Primary Electrophysiologist:  Dr. [name]  Patient Profile:   Jessica [name] is a 83 y.o. female with a hx of CAD, with stents in 0737, chronic diastolic HF, DM, HTN, HLD and SSS with MDT PPM in 2011, PAF, and CKD who is being seen today for the evaluation of hypotension at the request of Dr. Aggie Moats.  History of Present Illness:   Jessica [name]  hx of CAD, with stents in 1062, chronic diastolic HF, DM, HTN, HLD and SSS with MDT PPM in 2011, PAF, and CKD was seen by Dr. [name] yesterday and pt complained of feeling very weak and with BP systolic at home from 66 to 90s.  No chest pain or SOB.   BP in the office was 90/42.  Dr. [name] noted she was down 16 lbs in 6 weeks, and recommended hospitalization.  Pt did not wish to be admitted.  Her lasix, spironolactone and hydralazine held.  Lisinopril decreased to 2.5 mg daly.  And coreg from 25 mg to 6.25 BID.   She is on eliquis for PAF.   BP initially 195/161 but then 116/65.  p- 60s.   Kidney function yesterday 3.13 and today 2.54.  Normally 1.20 to 1.92.  BNP today 772 troponins neg at 0.02   Also with fall yesterday  AM and difficulty ambulating now. Lt ankle pain..   Last echo 02/24/17 EF 30-35%, mild AR, mild to mod MR, TR mild to mod. PA pk pressure 46 mmHg. Last cath 2010 with stents.  Today she felt no better and BP low though by time she arrived BP elevated due to difficulty ambulating.   No chest pain and no SOB.  No lightheadedness or dizziness.    CXR today with no change from 12/2014.  Probable small effusion and atelectasis or scar in the left base.  Ankle without fx. By XR.  BNP 772, troponin 0.02, Cr 2.54 Na 134. CBC Normal see below.  UA with + large leukocytes. Neg nitrites.    EKG a fib rate controlled.  I personally reviewed no changes from 01/16/17 but was in SR 09/2016.   Currently without complaints but can hardly move around.  Not sure the a fib is playing a big roll.  No chest pain.         Past Medical History:  Diagnosis Date  . Breast cancer ([place])   . CAD (coronary artery disease)    a. s/p DESx2 to mid and distal RCA in 2010.  Marland Kitchen Cholelithiasis 09/12/2013   Lap Chole on 09/14/13   . Chronic diastolic CHF (congestive heart failure) ([place])    a. Echo 06/09/15: EF 55-60%, normal wall motion,  mild AI, mild to moderate MR, mild LAE, mildly reduced RVSF, mild RAE, moderate TR, PASP 53 mmHg  . CKD (chronic kidney disease), stage III   . Diabetes mellitus    NON INSULIN DEPENDENT  . DJD (degenerative joint disease)   . GERD (gastroesophageal reflux disease)   . Hemorrhoids   . History of diabetic neuropathy   . Hypercholesterolemia   . Hypertension   . Hypertensive heart disease   . Paroxysmal atrial fibrillation (HCC)    a. discovered on PPM interrogation 12/15, CHAD2VASC score is 6. b. decompensation with dCHF in 06/2015 possibly due to AF, s/p DCCV.  . SSS (sick sinus syndrome) ([place])    a. s/p [employer] PPM by JA for SSS and syncope 9/11.    Past Surgical History:  Procedure Laterality Date  . CARDIAC CATHETERIZATION  07/05/2009   EF 55-60%  . CARDIOVERSION N/A 06/11/2015     Procedure: CARDIOVERSION;  Surgeon: [name], MD;  Location: [facility] ENDOSCOPY;  Service: Cardiovascular;  Laterality: N/A;  . CHOLECYSTECTOMY N/A 09/14/2013   Procedure: LAPAROSCOPIC CHOLECYSTECTOMY WITH INTRAOPERATIVE CHOLANGIOGRAM;  Surgeon: [name], MD;  Location: [facility];  Service: General;  Laterality: N/A;  . COLONOSCOPY    . ERCP N/A 09/13/2013   Procedure: ENDOSCOPIC RETROGRADE CHOLANGIOPANCREATOGRAPHY (ERCP);  Surgeon: [name], MD;  Location: [facility] ENDOSCOPY;  Service: Endoscopy;  Laterality: N/A;  . INSERT / REPLACE / REMOVE PACEMAKER  9/11   SSS and syncope, implanted by JA (MDT)  . MASTECTOMY  1992   BILATERAL WITH RECONSTRUCTION    OUTPATIENT MEDICATIONS: No current facility-administered medications on file prior to encounter.    Current Outpatient Prescriptions on File Prior to Encounter  Medication Sig Dispense Refill  . apixaban (ELIQUIS) 2.5 MG TABS tablet Take 1 tablet (2.5 mg total) by mouth 2 (two) times daily. 60 tablet 3  . b complex vitamins tablet Take 1 tablet by mouth daily.    . carvedilol (COREG) 6.25 MG tablet Take 1 tablet (6.25 mg total) by mouth 2 (two) times daily. 180 tablet 1  . ECHINACEA PO Take 750 mg by mouth daily.    . fish oil-omega-3 fatty acids 1000 MG capsule Take 1 g by mouth every morning.     . gabapentin (NEURONTIN) 100 MG capsule Take 1 capsule (100 mg total) by mouth at bedtime. Take in addition to the 300 mg cap you are already taking at night 90 capsule 3  . gabapentin (NEURONTIN) 300 MG capsule TAKE ONE CAPSULE BY MOUTH TWICE DAILY 180 capsule 0  . lisinopril (PRINIVIL,ZESTRIL) 2.5 MG tablet Take 1 tablet (2.5 mg total) by mouth daily. 90 tablet 1  . metFORMIN (GLUCOPHAGE) 500 MG tablet TAKE ONE TABLET BY MOUTH ONCE DAILY WITH BREAKFAST 90 tablet 3  . nitroGLYCERIN (NITROSTAT) 0.4 MG SL tablet Place 1 tablet (0.4 mg total) under the tongue every 5 (five) minutes as needed for chest pain. x3 doses as needed for chest  pain 25 tablet 0  . rosuvastatin (CRESTOR) 10 MG tablet TAKE ONE TABLET BY MOUTH ONCE DAILY IN THE MORNING 90 tablet 3  . blood glucose meter kit and supplies KIT Dispense based on patient and insurance preference. Test sugars once daily and prn as directed. (FOR ICD-9 250.00, 250.01). 1 each 0  . Misc. Devices (ROLLATOR ULTRA-LIGHT) MISC With seat.  Diabetic neuropathy, knee arthritis 1 each 0    Inpatient Medications: Scheduled Meds:  Continuous Infusions: . cefTRIAXone (ROCEPHIN)  IV     PRN  Meds:   Allergies:    Allergies  Allergen Reactions  . Lyrica [Pregabalin] Swelling    wgt gain  . Amlodipine Swelling  . Sulfonamide Derivatives Rash    Social History:   Social History   Social History  . Marital status: Widowed    Spouse name: N/A  . Number of children: N/A  . Years of education: N/A   Occupational History  . Not on file.   Social History Main Topics  . Smoking status: Never Smoker  . Smokeless tobacco: Never Used  . Alcohol use No  . Drug use: No  . Sexual activity: Not on file   Other Topics Concern  . Not on file   Social History Narrative  . No narrative on file    Family History:   The patient's family history includes Cancer in her father and mother.  ROS:  General:no colds or fevers, no weight changes Skin:no rashes or ulcers- though just above umbilicus area of scab HEENT:no blurred vision, no congestion CV:see HPI PUL:see HPI GI:no diarrhea constipation or melena, no indigestion GU:no hematuria, no dysuria MS:no joint pain, no claudication, + Lt leg pain above ankle, Neuro:no syncope, no lightheadedness Endo:+ diabetes, no thyroid disease      Physical Exam/Data:   Vitals:   03/28/17 1222 03/28/17 1300 03/28/17 1415 03/28/17 1430  BP: (!) 97/56 116/65 119/60 122/61  Pulse: 75 67 71 70  Resp: 18 (!) 23 (!) 24 (!) 23  Temp:      TempSrc:      SpO2: 95% 97% 100% 100%  Weight:      Height:       No intake or output data in  the 24 hours ending 03/28/17 1508 Filed Weights   03/28/17 1151  Weight: 158 lb (71.7 kg)   Body mass index is 27.99 kg/m.  General:  Well nourished, well developed, in no acute distress resting in bed HEENT: normal Lymph: no adenopathy Neck: no JVD Endocrine:  No thryomegaly Vascular: No carotid bruits; 2+ pedal pulses   Cardiac:  normal S1, S2; RRR; no murmur, rub or click  Lungs:  clear to auscultation bilaterally, no wheezing, rhonchi or rales  Abd: soft, nontender, no hepatomegaly  Ext: no edema.  Musculoskeletal:  No deformities, BUE and BLE strength normal and equal, + bruising Rt upper arm from fall Skin: warm and dry, above umbilicus with indurated area size of nickel with scab in middle and red ring around area that blanches.   Neuro:  A & O X 3 MAE, weak, follows commands + facial symmetry. Psych:  Normal affect     Relevant CV Studies  ECG and telemetry demonstrates atrial flutter with with controlled rate and ventricular pacing.  Laboratory Data:  Chemistry Recent Labs Lab 03/27/17 1111 03/28/17 1312  NA 136 134*  K 5.0 5.0  CL 105 104  CO2 24 22  GLUCOSE 116* 109*  BUN 68* 64*  CREATININE 3.13* 2.54*  CALCIUM 9.9 9.9  GFRNONAA 13* 16*  GFRAA 15* 18*  ANIONGAP  --  8     Recent Labs Lab 03/27/17 1111  PROT 6.3  ALBUMIN 3.9  AST 15  ALT 13  ALKPHOS 77  BILITOT 0.3   Hematology Recent Labs Lab 03/27/17 1111 03/28/17 1312  WBC 10.5 8.2  RBC 4.14 4.16  HGB  --  12.1  HCT 35.7 36.5  MCV 86 87.7  MCH 29.2 29.1  MCHC 33.9 33.2  RDW 16.9* 15.5  PLT 207  205   Cardiac EnzymesNo results for input(s): TROPONINI in the last 168 hours.  Recent Labs Lab 03/28/17 1320  TROPIPOC 0.02    BNP Recent Labs Lab 03/27/17 1112 03/28/17 1312  BNP  --  772.5*  PROBNP 2,510*  --     DDimer No results for input(s): DDIMER in the last 168 hours.  Radiology/Studies:  Dg Chest 2 View  Result Date: 03/28/2017 CLINICAL DATA:  Generalized  weakness and low blood pressure. Fall yesterday. EXAM: CHEST  2 VIEW COMPARISON:  June 08, 2015 FINDINGS: The heart, hila, and mediastinum are unchanged. No pneumothorax. Opacity and probable small effusion in the left base is similar in the interval. No significant change since February 2016. IMPRESSION: No acute interval change. Probable small effusion and atelectasis or scar in the left base. Electronically Signed   By: Dorise Bullion III M.D   On: 03/28/2017 13:41   Dg Ankle Complete Left  Result Date: 03/28/2017 CLINICAL DATA:  Fall yesterday.  Pain. EXAM: LEFT ANKLE COMPLETE - 3+ VIEW COMPARISON:  None. FINDINGS: There is no evidence of fracture, dislocation, or joint effusion. There is no evidence of arthropathy or other focal bone abnormality. Soft tissues are unremarkable. IMPRESSION: Negative. Electronically Signed   By: Dorise Bullion III M.D   On: 03/28/2017 13:42    Assessment and Plan:   Principal Problem:   Acute renal failure superimposed on stage 3 chronic kidney disease ([place]) Active Problems:   Diabetes ([place])   HYPERCHOLESTEROLEMIA   Benign hypertensive heart disease with heart failure (HCC)   Hypertension   SSS (sick sinus syndrome) (HCC)   CAD (coronary artery disease)   AF (paroxysmal atrial fibrillation) (HCC)   Dehydration   UTI (urinary tract infection)   Hypotension   1. Hypotension improved with decrease of meds.  Dr. Tamala Julian to see.  2. Acute on chronic renal failure - most likely gentle hydration.   3. A fib with PPM, do not believe contributing to this problem from 03/04/17 A fib burden 77.4% since 10/14/17  We can interrogate if needed. - CHA2DS2VASc score 7 on Eliquis at 2.5 BID due to renal issues  4.  CAD no chest pain and no elevated troponin, EKG stable.   5.  SSS with PPM  6. HTN controlled despite decreased meds  7. Hypotension improved with change in meds.   8.  Leg weakness after fall per Im neg xray ? Muscle soreness from fall, ?  Rhabdomyolysis check CK  Previous fall week before in driveway  9. ? Spider bite above umbilicus per IM   Signed, Jessica Kicks, NP  03/28/2017 3:08 PM

## 2017-03-28 NOTE — ED Triage Notes (Signed)
Pt brought in by family with c/o generalized weakness ongoing for a long time, per family. Family reports pt BP has been low for a while, today her BP is elevated. Pt fell yesterday was seen by doctor yesterday and was told she was dehydrated and they wanted to admit her to the hospital but pt refused.

## 2017-03-29 DIAGNOSIS — E860 Dehydration: Secondary | ICD-10-CM | POA: Diagnosis not present

## 2017-03-29 DIAGNOSIS — N170 Acute kidney failure with tubular necrosis: Secondary | ICD-10-CM | POA: Diagnosis not present

## 2017-03-29 DIAGNOSIS — N183 Chronic kidney disease, stage 3 (moderate): Secondary | ICD-10-CM | POA: Diagnosis not present

## 2017-03-29 DIAGNOSIS — I9589 Other hypotension: Secondary | ICD-10-CM | POA: Diagnosis not present

## 2017-03-29 LAB — GLUCOSE, CAPILLARY
GLUCOSE-CAPILLARY: 108 mg/dL — AB (ref 65–99)
GLUCOSE-CAPILLARY: 147 mg/dL — AB (ref 65–99)
Glucose-Capillary: 162 mg/dL — ABNORMAL HIGH (ref 65–99)
Glucose-Capillary: 152 mg/dL — ABNORMAL HIGH (ref 65–99)

## 2017-03-29 LAB — CBC
HCT: 36.1 % (ref 36.0–46.0)
HEMOGLOBIN: 11.7 g/dL — AB (ref 12.0–15.0)
MCH: 28.7 pg (ref 26.0–34.0)
MCHC: 32.4 g/dL (ref 30.0–36.0)
MCV: 88.5 fL (ref 78.0–100.0)
Platelets: 179 10*3/uL (ref 150–400)
RBC: 4.08 MIL/uL (ref 3.87–5.11)
RDW: 15.7 % — AB (ref 11.5–15.5)
WBC: 7.1 10*3/uL (ref 4.0–10.5)

## 2017-03-29 LAB — BASIC METABOLIC PANEL
ANION GAP: 5 (ref 5–15)
BUN: 53 mg/dL — AB (ref 6–20)
CALCIUM: 9.2 mg/dL (ref 8.9–10.3)
CO2: 22 mmol/L (ref 22–32)
CREATININE: 1.72 mg/dL — AB (ref 0.44–1.00)
Chloride: 111 mmol/L (ref 101–111)
GFR calc Af Amer: 29 mL/min — ABNORMAL LOW (ref >60)
GFR, EST NON AFRICAN AMERICAN: 25 mL/min — AB (ref >60)
GLUCOSE: 113 mg/dL — AB (ref 65–99)
Potassium: 4.9 mmol/L (ref 3.5–5.1)
Sodium: 138 mmol/L (ref 135–145)

## 2017-03-29 MED ORDER — CARVEDILOL 12.5 MG PO TABS
12.5000 mg | ORAL_TABLET | Freq: Two times a day (BID) | ORAL | Status: DC
  Administered 2017-03-29 – 2017-03-31 (×4): 12.5 mg via ORAL
  Filled 2017-03-29 (×4): qty 1

## 2017-03-29 NOTE — Discharge Instructions (Addendum)
·   Please note to take Ciprofloxacin antibiotic for 4 more days for urinary tract infection  Please also note, stop taking Lisinopril and Spironolactone until seen by primary provider, you must have kidney function test and potassium level checked to make sure it is stable prior to starting these medications   Please also note that dose of Lasix has been changed to 20 mg tablet twice daily   Coreg dose was increased to 12.5 mg twice daily     Information on my medicine - ELIQUIS (apixaban)  This medication education was reviewed with me or my healthcare representative as part of my discharge preparation.  The pharmacist that spoke with me during my hospital stay was:  Einar Grad, [facility]  Why was Eliquis prescribed for you? Eliquis was prescribed for you to reduce the risk of a blood clot forming that can cause a stroke if you have a medical condition called atrial fibrillation (a type of irregular heartbeat).  What do You need to know about Eliquis ? Take your Eliquis TWICE DAILY - one tablet in the morning and one tablet in the evening with or without food. If you have difficulty swallowing the tablet whole please discuss with your pharmacist how to take the medication safely.  Take Eliquis exactly as prescribed by your doctor and DO NOT stop taking Eliquis without talking to the doctor who prescribed the medication.  Stopping may increase your risk of developing a stroke.  Refill your prescription before you run out.  After discharge, you should have regular check-up appointments with your healthcare provider that is prescribing your Eliquis.  In the future your dose may need to be changed if your kidney function or weight changes by a significant amount or as you get older.  What do you do if you miss a dose? If you miss a dose, take it as soon as you remember on the same day and resume taking twice daily.  Do not take more than one dose of ELIQUIS at the same time to make up a  missed dose.  Important Safety Information A possible side effect of Eliquis is bleeding. You should call your healthcare provider right away if you experience any of the following: ? Bleeding from an injury or your nose that does not stop. ? Unusual colored urine (red or dark brown) or unusual colored stools (red or black). ? Unusual bruising for unknown reasons. ? A serious fall or if you hit your head (even if there is no bleeding).  Some medicines may interact with Eliquis and might increase your risk of bleeding or clotting while on Eliquis. To help avoid this, consult your healthcare provider or pharmacist prior to using any new prescription or non-prescription medications, including herbals, vitamins, non-steroidal anti-inflammatory drugs (NSAIDs) and supplements.  This website has more information on Eliquis (apixaban): http://www.eliquis.com/eliquis/home

## 2017-03-29 NOTE — Progress Notes (Signed)
Progress Note  Patient Name: Jessica [name] Date of Encounter: 03/29/2017  Primary Cardiologist: Ena Dawley  Subjective   Good night. No dyspnea. Slept well. Eating breakfast without difficulty.  Inpatient Medications    Scheduled Meds: . apixaban  2.5 mg Oral BID  . carvedilol  6.25 mg Oral BID WC  . gabapentin  300 mg Oral BID  . insulin aspart  0-9 Units Subcutaneous TID WC  . rosuvastatin  10 mg Oral Daily  . sodium chloride flush  3 mL Intravenous Q12H   Continuous Infusions: . sodium chloride 50 mL/hr (03/28/17 1812)  . cefTRIAXone (ROCEPHIN)  IV     PRN Meds: acetaminophen **OR** acetaminophen, nitroGLYCERIN, ondansetron **OR** ondansetron (ZOFRAN) IV, senna-docusate   Vital Signs    Vitals:   03/28/17 2039 03/28/17 2347 03/29/17 0505 03/29/17 0817  BP: (!) 129/55 (!) 156/75 (!) 148/86 128/64  Pulse: 68 65 60 66  Resp: 18 18 16    Temp: 98.5 F (36.9 C) 98.5 F (36.9 C) 98.3 F (36.8 C)   TempSrc: Axillary Oral Oral   SpO2: 100% 100% 100%   Weight:   168 lb 1.6 oz (76.2 kg)   Height:        Intake/Output Summary (Last 24 hours) at 03/29/17 0853 Last data filed at 03/29/17 0600  Gross per 24 hour  Intake              600 ml  Output              350 ml  Net              250 ml   Filed Weights   03/28/17 1151 03/28/17 1751 03/29/17 0505  Weight: 158 lb (71.7 kg) 160 lb 0.9 oz (72.6 kg) 168 lb 1.6 oz (76.2 kg)    Telemetry    Atrial fibrillation with ventricular pacing - Personally Reviewed  ECG    No new study - Personally Reviewed  Physical Exam  Slightly pale, no distress, without respiratory concerns. GEN: No acute distress.   Neck: No JVD Cardiac: RRR, no murmurs, rubs, or gallops.  Respiratory: Clear to auscultation bilaterally. GI: Soft, nontender, non-distended  MS: No edema; No deformity. Neuro:  Nonfocal  Psych: Normal affect   Labs    Chemistry Recent Labs Lab 03/27/17 1111 03/28/17 1312 03/29/17 0456  NA 136  134* 138  K 5.0 5.0 4.9  CL 105 104 111  CO2 24 22 22   GLUCOSE 116* 109* 113*  BUN 68* 64* 53*  CREATININE 3.13* 2.54* 1.72*  CALCIUM 9.9 9.9 9.2  PROT 6.3  --   --   ALBUMIN 3.9  --   --   AST 15  --   --   ALT 13  --   --   ALKPHOS 77  --   --   BILITOT 0.3  --   --   GFRNONAA 13* 16* 25*  GFRAA 15* 18* 29*  ANIONGAP  --  8 5     Hematology Recent Labs Lab 03/27/17 1111 03/28/17 1312 03/29/17 0456  WBC 10.5 8.2 7.1  RBC 4.14 4.16 4.08  HGB  --  12.1 11.7*  HCT 35.7 36.5 36.1  MCV 86 87.7 88.5  MCH 29.2 29.1 28.7  MCHC 33.9 33.2 32.4  RDW 16.9* 15.5 15.7*  PLT 207 205 179    Cardiac EnzymesNo results for input(s): TROPONINI in the last 168 hours.  Recent Labs Lab 03/28/17 1320  TROPIPOC 0.02  BNP Recent Labs Lab 03/27/17 1112 03/28/17 1312  BNP  --  772.5*  PROBNP 2,510*  --      DDimer No results for input(s): DDIMER in the last 168 hours.   Radiology    Dg Chest 2 View  Result Date: 03/28/2017 CLINICAL DATA:  Generalized weakness and low blood pressure. Fall yesterday. EXAM: CHEST  2 VIEW COMPARISON:  June 08, 2015 FINDINGS: The heart, hila, and mediastinum are unchanged. No pneumothorax. Opacity and probable small effusion in the left base is similar in the interval. No significant change since February 2016. IMPRESSION: No acute interval change. Probable small effusion and atelectasis or scar in the left base. Electronically Signed   By: Dorise Bullion III M.D   On: 03/28/2017 13:41   Dg Ankle Complete Left  Result Date: 03/28/2017 CLINICAL DATA:  Fall yesterday.  Pain. EXAM: LEFT ANKLE COMPLETE - 3+ VIEW COMPARISON:  None. FINDINGS: There is no evidence of fracture, dislocation, or joint effusion. There is no evidence of arthropathy or other focal bone abnormality. Soft tissues are unremarkable. IMPRESSION: Negative. Electronically Signed   By: Dorise Bullion III M.D   On: 03/28/2017 13:42    Cardiac Studies   No new studies  Patient  Profile     83 y.o. female with a hx of CAD, with stents in 2707, chronic diastolic HF, DM, HTN, HLD and SSS with MDT PPM in 2011, PAF, and CKD who is admitted with volume contraction secondary to diuresis.  Assessment & Plan    1. Dehydration/volume contraction improving after receiving approximately 1000 cc of IV fluid. At completion of the current 20 hour infusion of 50 cc of saline per hour would discontinue IV rehydration. We will need to resume a diuretic regimen slightly less intense than prior. I would suggest 60 mg of furosemide daily with close follow-up of weight and clinical status. 2. Chronic diastolic heart failure without evidence of volume overload. 3. Acute on chronic kidney disease, stage IV, significantly improving with hydration. 4. Atrial fibrillation with rate control  Would not resume oral diuretic therapy yet. In a.m. consider starting less intense oral diuretic regimen prior to discharge. I have recommended 60 mg daily but should probably consult with Dr. Ena Dawley to get her opinion as she follows her chronically as an outpatient.   Signed, Sinclair Grooms, MD  03/29/2017, 8:53 AM

## 2017-03-29 NOTE — Progress Notes (Signed)
Patient ID: Jessica [name], female   DOB: [birth year], 83 y.o.   MRN: [id number]    PROGRESS NOTE    Jessica [name]  UMP:[id number] DOB: [birth year] DOA: 03/28/2017  PCP: Binnie Rail, MD   Brief Narrative:  Pt is 83 yo female with CAD, s/p stenting x 2, CHF, CKD stage III, paroxysmal a-fib on Apixaban, presented with weakness and dehydration in the setting of lasix use. Also concern for ? UTI.   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   Objective: Vitals:   03/28/17 2039 03/28/17 2347 03/29/17 0505 03/29/17 0817  BP: (!) 129/55 (!) 156/75 (!) 148/86 128/64  Pulse: 68 65 60 66  Resp: 18 18 16    Temp: 98.5 F (36.9 C) 98.5 F (36.9 C) 98.3 F (36.8 C)   TempSrc: Axillary Oral Oral   SpO2: 100% 100% 100%   Weight:   76.2 kg (168 lb 1.6 oz)   Height:        Intake/Output  Summary (Last 24 hours) at 03/29/17 1405 Last data filed at 03/29/17 0934  Gross per 24 hour  Intake              840 ml  Output              350 ml  Net              490 ml   Filed Weights   03/28/17 1151 03/28/17 1751 03/29/17 0505  Weight: 71.7 kg (158 lb) 72.6 kg (160 lb 0.9 oz) 76.2 kg (168 lb 1.6 oz)    Examination:  General exam: Appears calm and comfortable  Respiratory system: Clear to auscultation. Respiratory effort normal. Cardiovascular system: S1 & S2 heard, RRR. No JVD, rubs, gallops or clicks. No pedal edema. Gastrointestinal system: Abdomen is nondistended, soft and nontender. No organomegaly or masses felt.  Central nervous system: Alert and oriented. No focal neurological deficits. Extremities: Symmetric 5 x 5 power.  Data Reviewed: I have personally reviewed following labs and imaging studies  CBC:  Recent Labs Lab 03/27/17 1111 03/28/17 1312 03/29/17 0456  WBC 10.5 8.2 7.1  NEUTROABS 7.6*  --   --   HGB  --  12.1 11.7*  HCT 35.7 36.5 36.1  MCV 86 87.7 88.5  PLT 207 205 409   Basic Metabolic Panel:  Recent Labs Lab 03/27/17 1111 03/28/17 1312 03/28/17 1603 03/29/17 0456  NA 136 134*  --  138  K 5.0 5.0  --  4.9  CL 105 104  --  111  CO2 24 22  --  22  GLUCOSE 116* 109*  --  113*  BUN 68* 64*  --  53*  CREATININE 3.13* 2.54*  --  1.72*  CALCIUM 9.9 9.9  --  9.2  MG  --   --  2.3  --   PHOS  --   --  3.5  --    Liver Function Tests:  Recent Labs Lab 03/27/17 1111  AST 15  ALT 13  ALKPHOS 77  BILITOT 0.3  PROT 6.3  ALBUMIN 3.9   Cardiac Enzymes:  Recent Labs Lab 03/28/17 1603  CKTOTAL 71   BNP (last 3 results)  Recent Labs  02/11/17 1330 02/17/17 1616 03/27/17 1112  PROBNP 7,753* 4,735* 2,510*   CBG:  Recent Labs Lab 03/28/17 1757 03/28/17 2114 03/29/17 0814 03/29/17 1149  GLUCAP 114* 124* 108* 147*   Urine analysis:    Component Value Date/Time   COLORURINE YELLOW 03/28/2017 1443   APPEARANCEUR CLOUDY  (A) 03/28/2017 1443   LABSPEC 1.006 03/28/2017 1443   PHURINE 6.0 03/28/2017 1443   GLUCOSEU NEGATIVE 03/28/2017 1443   [place] 03/28/2017 1443   [place] 03/28/2017 1443   KETONESUR NEGATIVE 03/28/2017 1443   PROTEINUR NEGATIVE 03/28/2017 1443   UROBILINOGEN 1.0 09/12/2013 0639   NITRITE NEGATIVE 03/28/2017 1443   LEUKOCYTESUR LARGE (A) 03/28/2017 1443   Recent Results (from the past 240 hour(s))  Urine culture     Status: Abnormal (Preliminary result)   Collection Time: 03/28/17  2:48 PM  Result Value Ref Range Status   Specimen Description URINE, RANDOM  Final   Special Requests NONE  Final   Culture (A)  Final    40,000 COLONIES/mL ENTEROBACTER SPECIES SUSCEPTIBILITIES TO FOLLOW    Report Status PENDING  Incomplete    Radiology Studies: Dg Chest 2 View  Result Date: 03/28/2017 CLINICAL DATA:  Generalized weakness and low blood pressure. Fall yesterday. EXAM: CHEST  2 VIEW COMPARISON:  June 08, 2015 FINDINGS: The heart, hila, and mediastinum are unchanged. No pneumothorax. Opacity and probable small effusion in the left base is similar in the interval. No significant change since February 2016. IMPRESSION: No acute interval change. Probable small effusion and atelectasis or scar in the left base. Electronically Signed   By: Dorise Bullion III M.D   On: 03/28/2017 13:41   Dg Ankle Complete Left  Result Date: 03/28/2017 CLINICAL DATA:  Fall yesterday.  Pain. EXAM: LEFT ANKLE COMPLETE - 3+ VIEW COMPARISON:  None. FINDINGS: There is no evidence of fracture, dislocation, or joint effusion. There is no evidence of arthropathy or other focal bone abnormality. Soft tissues are unremarkable. IMPRESSION: Negative. Electronically Signed   By: Dorise Bullion III M.D   On: 03/28/2017 13:42    Scheduled Meds: . apixaban  2.5 mg Oral BID  . carvedilol  12.5 mg Oral BID WC  . gabapentin  300 mg Oral BID  . insulin aspart  0-9 Units Subcutaneous TID WC  . rosuvastatin   10 mg Oral Daily  . sodium chloride flush  3 mL Intravenous Q12H   Continuous Infusions: . cefTRIAXone (ROCEPHIN)  IV       LOS: 0 days   Time  spent: 20 minutes   Faye Ramsay, MD [facility] Pager [phone]  If 7PM-7AM, please contact night-coverage [email or web address] Password [email or web address] 03/29/2017, 2:05 PM

## 2017-03-29 NOTE — Progress Notes (Signed)
Progress Note  Patient Name: Jessica [name] Date of Encounter: 03/29/2017  Primary Cardiologist: Dr [name]  Subjective   She feels better today.  Inpatient Medications    Scheduled Meds: . apixaban  2.5 mg Oral BID  . carvedilol  6.25 mg Oral BID WC  . gabapentin  300 mg Oral BID  . insulin aspart  0-9 Units Subcutaneous TID WC  . rosuvastatin  10 mg Oral Daily  . sodium chloride flush  3 mL Intravenous Q12H   Continuous Infusions: . sodium chloride 50 mL/hr (03/28/17 1812)  . cefTRIAXone (ROCEPHIN)  IV     PRN Meds: acetaminophen **OR** acetaminophen, nitroGLYCERIN, ondansetron **OR** ondansetron (ZOFRAN) IV, senna-docusate   Vital Signs    Vitals:   03/28/17 2039 03/28/17 2347 03/29/17 0505 03/29/17 0817  BP: (!) 129/55 (!) 156/75 (!) 148/86 128/64  Pulse: 68 65 60 66  Resp: 18 18 16    Temp: 98.5 F (36.9 C) 98.5 F (36.9 C) 98.3 F (36.8 C)   TempSrc: Axillary Oral Oral   SpO2: 100% 100% 100%   Weight:   168 lb 1.6 oz (76.2 kg)   Height:        Intake/Output Summary (Last 24 hours) at 03/29/17 0902 Last data filed at 03/29/17 0600  Gross per 24 hour  Intake              600 ml  Output              350 ml  Net              250 ml   Filed Weights   03/28/17 1151 03/28/17 1751 03/29/17 0505  Weight: 158 lb (71.7 kg) 160 lb 0.9 oz (72.6 kg) 168 lb 1.6 oz (76.2 kg)    Telemetry    V paced rhythm - Personally Reviewed  ECG    A-fib, V paced rhythm - Personally Reviewed  Physical Exam   GEN: No acute distress.   Neck: No JVD Cardiac: RRR, no murmurs, rubs, or gallops.  Respiratory: Clear to auscultation bilaterally. GI: Soft, nontender, non-distended  MS: No edema; No deformity. Neuro:  Nonfocal  Psych: Normal affect   Labs    Chemistry Recent Labs Lab 03/27/17 1111 03/28/17 1312 03/29/17 0456  NA 136 134* 138  K 5.0 5.0 4.9  CL 105 104 111  CO2 24 22 22   GLUCOSE 116* 109* 113*  BUN 68* 64* 53*  CREATININE 3.13* 2.54* 1.72*    CALCIUM 9.9 9.9 9.2  PROT 6.3  --   --   ALBUMIN 3.9  --   --   AST 15  --   --   ALT 13  --   --   ALKPHOS 77  --   --   BILITOT 0.3  --   --   GFRNONAA 13* 16* 25*  GFRAA 15* 18* 29*  ANIONGAP  --  8 5     Hematology Recent Labs Lab 03/27/17 1111 03/28/17 1312 03/29/17 0456  WBC 10.5 8.2 7.1  RBC 4.14 4.16 4.08  HGB  --  12.1 11.7*  HCT 35.7 36.5 36.1  MCV 86 87.7 88.5  MCH 29.2 29.1 28.7  MCHC 33.9 33.2 32.4  RDW 16.9* 15.5 15.7*  PLT 207 205 179    Cardiac EnzymesNo results for input(s): TROPONINI in the last 168 hours.  Recent Labs Lab 03/28/17 1320  TROPIPOC 0.02     BNP Recent Labs Lab 03/27/17 1112 03/28/17 1312  BNP  --  772.5*  PROBNP 2,510*  --      DDimer No results for input(s): DDIMER in the last 168 hours.   Radiology    Dg Chest 2 View  Result Date: 03/28/2017 CLINICAL DATA:  Generalized weakness and low blood pressure. Fall yesterday. EXAM: CHEST  2 VIEW COMPARISON:  June 08, 2015 FINDINGS: The heart, hila, and mediastinum are unchanged. No pneumothorax. Opacity and probable small effusion in the left base is similar in the interval. No significant change since February 2016. IMPRESSION: No acute interval change. Probable small effusion and atelectasis or scar in the left base. Electronically Signed   By: Dorise Bullion III M.D   On: 03/28/2017 13:41   Dg Ankle Complete Left  Result Date: 03/28/2017 CLINICAL DATA:  Fall yesterday.  Pain. EXAM: LEFT ANKLE COMPLETE - 3+ VIEW COMPARISON:  None. FINDINGS: There is no evidence of fracture, dislocation, or joint effusion. There is no evidence of arthropathy or other focal bone abnormality. Soft tissues are unremarkable. IMPRESSION: Negative. Electronically Signed   By: Dorise Bullion III M.D   On: 03/28/2017 13:42    Cardiac Studies   - Left ventricle: The cavity size was normal. Wall thickness was   normal. Systolic function was moderately to severely reduced. The   estimated ejection  fraction was in the range of 30% to 35%. - Aortic valve: Mildly calcified annulus. Mildly thickened, mildly   calcified leaflets. There was mild regurgitation. - Mitral valve: There was mild to moderate regurgitation. - Right ventricle: Systolic function was mildly to moderately   reduced. - Tricuspid valve: There was mild-moderate regurgitation. - Pulmonary arteries: Systolic pressure was moderately increased.   PA peak pressure: 46 mm Hg (S).  Patient Profile     83 y.o. female with known CMP, chronic combined systolic and diastolic CHF admitted with dehydration  Assessment & Plan    1. Acute on chronic diastolic/ Hypertensive heart disease  Admitted with volume contraction, dehydration, acute on chronic kidney failure Rehydrated Crea 3.1->2.5-> 1.7, BUN 68->53, K 4.9 - held lasix, spironolactone, hydralazine, lisinopril - she is now hypertensive, I will increase carvedilol to 12.5 mg po BID - plan for a discharge tomorrow  2. Acute on chronic CKD stage IV - as above  3. HTN - increase carvedilol, hold lisinopril for now  4. Paroxysmal atrial fib - low burden by recent interrogation of pacemaker. Continue Eliquis - the dose was decreased with decreasing GFR, we will have to reconsider continuation with frequent fall in the last few weeks.   5. CAD- she is not describing any recurrent symptoms of angina. Continue BB, statin. Not on ASA due to ongoing Eliquis use.  Signed, Ena Dawley, MD  03/29/2017, 9:02 AM

## 2017-03-29 NOTE — Evaluation (Signed)
Physical Therapy Evaluation Patient Details Name: Jessica [name] MRN: [id number] DOB: 01-Sep-1927 Today's Date: 03/29/2017   History of Present Illness  Pt is a 83 yo female admitted through ED with weakness and fatigue. Pt was admitted with dehydration and a UTI with ARF of CKD III. PMH significant for Severe CAD with 2 stents placed, CHF, CKD III, DM, GERD, HLD, HTN.   Clinical Impression  Pt presents with the above diagnosis and below deficits for therapy evaluation. Prior to admission, pt lived with her daughter in a single level home. Pt reports that her daughter does assist with all household chores. Pt was able to perform gait with a RW at home. Pt requires Min A for all mobility this session and is having severe incontinence due to medications. Pt will benefit from continued acute PT services in order to address the below deficits prior to DC to venue recommended below.     Follow Up Recommendations Home health PT    Equipment Recommendations  None recommended by PT    Recommendations for Other Services       Precautions / Restrictions Precautions Precautions: Fall Precaution Comments: external catheter Restrictions Weight Bearing Restrictions: No      Mobility  Bed Mobility Overal bed mobility: Needs Assistance Bed Mobility: Supine to Sit     Supine to sit: Min assist     General bed mobility comments: Min A to bring LE's EOB and to scoot bottom to edge using pads  Transfers Overall transfer level: Needs assistance Equipment used: Rolling walker (2 wheeled) Transfers: Sit to/from [facility] Sit to Stand: Min assist Stand pivot transfers: Min assist       General transfer comment: Min A to stand and Min A throughout transfer for safety and to stabilize at trunk  Ambulation/Gait             General Gait Details: unable to assess this session due to severe incontinence  Stairs            Wheelchair Mobility    Modified Rankin  (Stroke Patients Only)       Balance Overall balance assessment: Needs assistance Sitting-balance support: No upper extremity supported;Feet supported Sitting balance-Leahy Scale: Good     Standing balance support: Bilateral upper extremity supported;During functional activity Standing balance-Leahy Scale: Poor Standing balance comment: reliant on RW for stability in standing                             Pertinent Vitals/Pain Pain Assessment: No/denies pain    Home Living Family/patient expects to be discharged to:: Private residence Living Arrangements: Children Available Help at Discharge: Family;Available 24 hours/day Type of Home: House Home Access: Level entry     Home Layout: One level Home Equipment: Walker - 2 wheels;Walker - 4 wheels;Bedside commode;Grab bars - tub/shower;Shower seat      Prior Function Level of Independence: Needs assistance   Gait / Transfers Assistance Needed: uses RW for gait.   ADL's / Homemaking Assistance Needed: daugther assists with housekeeping and cooking. occasional assistance with dressing and bathing        Hand Dominance   Dominant Hand: Right    Extremity/Trunk Assessment   Upper Extremity Assessment Upper Extremity Assessment: Defer to OT evaluation    Lower Extremity Assessment Lower Extremity Assessment: Generalized weakness    Cervical / Trunk Assessment Cervical / Trunk Assessment: Normal  Communication   Communication: HOH;Other (comment) (requires repition  of questions)  Cognition Arousal/Alertness: Awake/alert Behavior During Therapy: WFL for tasks assessed/performed Overall Cognitive Status: Within Functional Limits for tasks assessed                                        General Comments General comments (skin integrity, edema, etc.): pt very incontinenent due to medications. Daughter was to bring in Depends.     Exercises     Assessment/Plan    PT Assessment Patient  needs continued PT services  PT Problem List Decreased strength;Decreased activity tolerance;Decreased balance;Decreased mobility;Decreased knowledge of use of DME       PT Treatment Interventions DME instruction;Gait training;Functional mobility training;Therapeutic activities;Therapeutic exercise;Balance training;Patient/family education    PT Goals (Current goals can be found in the Care Plan section)  Acute Rehab PT Goals Patient Stated Goal: to feel better PT Goal Formulation: With patient Time For Goal Achievement: 04/05/17 Potential to Achieve Goals: Good    Frequency Min 3X/week   Barriers to discharge        Co-evaluation               AM-PAC PT "6 Clicks" Daily Activity  Outcome Measure Difficulty turning over in bed (including adjusting bedclothes, sheets and blankets)?: None Difficulty moving from lying on back to sitting on the side of the bed? : Total Difficulty sitting down on and standing up from a chair with arms (e.g., wheelchair, bedside commode, etc,.)?: A Lot Help needed moving to and from a bed to chair (including a wheelchair)?: A Lot Help needed walking in hospital room?: A Lot Help needed climbing 3-5 steps with a railing? : A Lot 6 Click Score: 13    End of Session Equipment Utilized During Treatment: Gait belt Activity Tolerance: Patient tolerated treatment well Patient left: in chair;with call bell/phone within reach;with chair alarm set Nurse Communication: Mobility status PT Visit Diagnosis: Unsteadiness on feet (R26.81);Muscle weakness (generalized) (M62.81);Difficulty in walking, not elsewhere classified (R26.2)    Time: 0737-1062 PT Time Calculation (min) (ACUTE ONLY): 42 min   Charges:   PT Evaluation $PT Eval Moderate Complexity: 1 Procedure PT Treatments $Therapeutic Activity: 23-37 mins   PT G Codes:   PT G-Codes **NOT FOR INPATIENT CLASS** Functional Assessment Tool Used: AM-PAC 6 Clicks Basic Mobility;Clinical  judgement Functional Limitation: Mobility: Walking and moving around Mobility: Walking and Moving Around Current Status (I9485): At least 40 percent but less than 60 percent impaired, limited or restricted Mobility: Walking and Moving Around Goal Status [phone]): At least 1 percent but less than 20 percent impaired, limited or restricted    Scheryl Marten PT, DPT  [phone]   Shanon Rosser 03/29/2017, 1:28 PM

## 2017-03-30 ENCOUNTER — Observation Stay (HOSPITAL_COMMUNITY): Payer: Medicare Other

## 2017-03-30 DIAGNOSIS — I480 Paroxysmal atrial fibrillation: Secondary | ICD-10-CM | POA: Diagnosis not present

## 2017-03-30 DIAGNOSIS — I2510 Atherosclerotic heart disease of native coronary artery without angina pectoris: Secondary | ICD-10-CM | POA: Diagnosis not present

## 2017-03-30 DIAGNOSIS — N170 Acute kidney failure with tubular necrosis: Secondary | ICD-10-CM | POA: Diagnosis not present

## 2017-03-30 DIAGNOSIS — I110 Hypertensive heart disease with heart failure: Secondary | ICD-10-CM | POA: Diagnosis not present

## 2017-03-30 DIAGNOSIS — N183 Chronic kidney disease, stage 3 (moderate): Secondary | ICD-10-CM | POA: Diagnosis not present

## 2017-03-30 DIAGNOSIS — I10 Essential (primary) hypertension: Secondary | ICD-10-CM

## 2017-03-30 LAB — GLUCOSE, CAPILLARY
GLUCOSE-CAPILLARY: 102 mg/dL — AB (ref 65–99)
GLUCOSE-CAPILLARY: 167 mg/dL — AB (ref 65–99)
Glucose-Capillary: 133 mg/dL — ABNORMAL HIGH (ref 65–99)
Glucose-Capillary: 134 mg/dL — ABNORMAL HIGH (ref 65–99)

## 2017-03-30 LAB — CBC
HCT: 31.6 % — ABNORMAL LOW (ref 36.0–46.0)
HEMOGLOBIN: 10.7 g/dL — AB (ref 12.0–15.0)
MCH: 29.9 pg (ref 26.0–34.0)
MCHC: 33.9 g/dL (ref 30.0–36.0)
MCV: 88.3 fL (ref 78.0–100.0)
Platelets: 168 10*3/uL (ref 150–400)
RBC: 3.58 MIL/uL — AB (ref 3.87–5.11)
RDW: 15.5 % (ref 11.5–15.5)
WBC: 7.8 10*3/uL (ref 4.0–10.5)

## 2017-03-30 LAB — BASIC METABOLIC PANEL
ANION GAP: 5 (ref 5–15)
BUN: 41 mg/dL — ABNORMAL HIGH (ref 6–20)
CALCIUM: 8.9 mg/dL (ref 8.9–10.3)
CO2: 21 mmol/L — AB (ref 22–32)
Chloride: 111 mmol/L (ref 101–111)
Creatinine, Ser: 1.38 mg/dL — ABNORMAL HIGH (ref 0.44–1.00)
GFR calc non Af Amer: 33 mL/min — ABNORMAL LOW (ref >60)
GFR, EST AFRICAN AMERICAN: 38 mL/min — AB (ref >60)
Glucose, Bld: 183 mg/dL — ABNORMAL HIGH (ref 65–99)
Potassium: 4.5 mmol/L (ref 3.5–5.1)
Sodium: 137 mmol/L (ref 135–145)

## 2017-03-30 LAB — URINE CULTURE

## 2017-03-30 MED ORDER — CIPROFLOXACIN HCL 500 MG PO TABS: 500.0000 mg | ORAL_TABLET | Freq: Two times a day (BID) | ORAL | 0 refills | Status: DC

## 2017-03-30 MED ORDER — FUROSEMIDE 20 MG PO TABS: 20.0000 mg | ORAL_TABLET | Freq: Two times a day (BID) | ORAL | 0 refills | Status: DC

## 2017-03-30 MED ORDER — FUROSEMIDE 20 MG PO TABS
20.0000 mg | ORAL_TABLET | Freq: Two times a day (BID) | ORAL | Status: DC
  Administered 2017-03-30: 20 mg via ORAL
  Filled 2017-03-30: qty 1

## 2017-03-30 MED ORDER — CARVEDILOL 12.5 MG PO TABS: 12.5000 mg | ORAL_TABLET | Freq: Two times a day (BID) | ORAL | 0 refills | Status: DC

## 2017-03-30 NOTE — Progress Notes (Signed)
Cannot complete pt admission database due to pt level of consciousness, will complete if family is at bedside later today    Jessica [name]

## 2017-03-30 NOTE — Progress Notes (Signed)
MD [name] informed of pt inability to walk, MD placed physical therapy order and stated may possibly need further scans of her leg but is cleared by cardiology.  Paged MD Doyle Askew, MD aware, stated she will place discharge on hold until pt is evaluated by physical therapy again. MD also stated she will call the patients daughter for an update  Jessica [name]

## 2017-03-30 NOTE — Progress Notes (Signed)
Pt family at bedside, completed admission data base. Removed pt purewick to encourage ambulation in preporation for discharge home, will continue to wean oxygen   Erikah Thumm

## 2017-03-30 NOTE — Care Management Note (Addendum)
Case Management Note  Patient Details  Name: Jessica NARAYANAN MRN: [id number] Date of Birth: 01-21-1927  Subjective/Objective:       CM following for progression and d/c planning.              Action/Plan: 03/30/2017 Met with pt and daughter, for [facility] services the have selected [facility] as they have used this agency previously. They are also requesting "Joey" the PT who worked with this pt previously. Explained to pt and daughter that [facility] is now [facility] notified of new orders.  As well as request for "Joey" for HHPT.   Expected Discharge Date:                  Expected Discharge Plan:  [place]  In-House Referral:  NA  Discharge planning Services  CM Consult  Post Acute Care Choice:  Home Health Choice offered to:  NA  DME Arranged:  N/A DME Agency:  NA  HH Arranged:  RN, PT, aide [place] Agency:  [facility] (now [facility])  Status of Service:  Completed, signed off  If discussed at [place] of Stay Meetings, dates discussed:    Additional Comments:  Adron Bene, RN 03/30/2017, 11:54 AM

## 2017-03-30 NOTE — Progress Notes (Signed)
Physical Therapy Treatment Patient Details Name: Jessica [name] MRN: [id number] DOB: 28-Apr-1927 Today's Date: 03/30/2017    History of Present Illness Pt is a 83 yo female admitted through ED with weakness and fatigue. Pt was admitted with dehydration and a UTI with ARF of CKD III. PMH significant for Severe CAD with 2 stents placed, CHF, CKD III, DM, GERD, HLD, HTN.     PT Comments    Patient is progressing toward mobility goals. Pt able to ambulate 145ft with min guard assist for safety. Pt with no increased c/o pain in L LE and reported feeling mostly neuropathy pain in bilat feet which, for her, is normal. Pt will continue to benefit from further skilled PT services in acute setting as well as upon d/c to maximize independence and safety with mobility.    Follow Up Recommendations  Home health PT     Equipment Recommendations  None recommended by PT    Recommendations for Other Services       Precautions / Restrictions Precautions Precautions: Fall Restrictions Weight Bearing Restrictions: No    Mobility  Bed Mobility Overal bed mobility: Needs Assistance Bed Mobility: Sit to Supine       Sit to supine: Min assist   General bed mobility comments: assist to bring bilat LE into bed; cues for sequencing; pt sitting EOB upon arrival  Transfers Overall transfer level: Needs assistance Equipment used: Rolling walker (2 wheeled) Transfers: Sit to/from Stand Sit to Stand: Min assist         General transfer comment: assist to power up into standing; cues for safe hand placement  Ambulation/Gait Ambulation/Gait assistance: Min guard Ambulation Distance (Feet): 100 Feet Assistive device: Rolling walker (2 wheeled) Gait Pattern/deviations: Step-through pattern;Decreased stride length;Decreased dorsiflexion - right;Decreased dorsiflexion - left;Trunk flexed Gait velocity: decreased   General Gait Details: cues for posture and proximity of RW; decreased cadence; no c/o  increased pain in L LE with ambulation   Stairs            Wheelchair Mobility    Modified Rankin (Stroke Patients Only)       Balance Overall balance assessment: Needs assistance Sitting-balance support: No upper extremity supported;Feet supported Sitting balance-Leahy Scale: Good     Standing balance support: Bilateral upper extremity supported;During functional activity Standing balance-Leahy Scale: Poor Standing balance comment: reliant on RW for stability in standing                            Cognition Arousal/Alertness: Awake/alert Behavior During Therapy: WFL for tasks assessed/performed Overall Cognitive Status: Within Functional Limits for tasks assessed                                        Exercises      General Comments General comments (skin integrity, edema, etc.): daughter present      Pertinent Vitals/Pain Pain Assessment: Faces Faces Pain Scale: Hurts a little bit Pain Location: bilat toes  Pain Descriptors / Indicators: Burning;Sore Pain Intervention(s): Limited activity within patient's tolerance;Monitored during session;Repositioned    Home Living Family/patient expects to be discharged to:: Private residence Living Arrangements: Children Available Help at Discharge: Family;Available 24 hours/day Type of Home: House Home Access: Level entry   Home Layout: One level Home Equipment: Shower seat;Shower seat - built in;Bedside commode;Walker - 2 wheels;Walker - 4 wheels;Grab bars - tub/shower  Prior Function      ADL's / Homemaking Assistance Needed: daugther assists with housekeeping and cooking. occasional assistance with dressing and bathing     PT Goals (current goals can now be found in the care plan section) Acute Rehab PT Goals Patient Stated Goal: to feel better PT Goal Formulation: With patient Time For Goal Achievement: 04/05/17 Potential to Achieve Goals: Good Progress towards PT goals:  Progressing toward goals    Frequency    Min 3X/week      PT Plan Current plan remains appropriate    Co-evaluation              AM-PAC PT "6 Clicks" Daily Activity  Outcome Measure  Difficulty turning over in bed (including adjusting bedclothes, sheets and blankets)?: None Difficulty moving from lying on back to sitting on the side of the bed? : Total Difficulty sitting down on and standing up from a chair with arms (e.g., wheelchair, bedside commode, etc,.)?: Total Help needed moving to and from a bed to chair (including a wheelchair)?: A Little Help needed walking in hospital room?: A Little Help needed climbing 3-5 steps with a railing? : A Little 6 Click Score: 15    End of Session Equipment Utilized During Treatment: Gait belt Activity Tolerance: Patient tolerated treatment well Patient left: with call bell/phone within reach;in bed;with family/visitor present Nurse Communication: Mobility status PT Visit Diagnosis: Unsteadiness on feet (R26.81);Muscle weakness (generalized) (M62.81);Difficulty in walking, not elsewhere classified (R26.2)     Time: 1345-1410 PT Time Calculation (min) (ACUTE ONLY): 25 min  Charges:  $Gait Training: 8-22 mins $Therapeutic Activity: 8-22 mins                    G Codes:       Earney Navy, PTA Pager: [phone]     Darliss Cheney 03/30/2017, 2:19 PM

## 2017-03-30 NOTE — Progress Notes (Signed)
Progress Note  Patient Name: Jessica [name] Date of Encounter: 03/30/2017  Primary Cardiologist: Dr [name]  Subjective   She feels even better today.  Inpatient Medications    Scheduled Meds: . apixaban  2.5 mg Oral BID  . carvedilol  12.5 mg Oral BID WC  . gabapentin  300 mg Oral BID  . insulin aspart  0-9 Units Subcutaneous TID WC  . rosuvastatin  10 mg Oral Daily  . sodium chloride flush  3 mL Intravenous Q12H   Continuous Infusions: . cefTRIAXone (ROCEPHIN)  IV Stopped (03/29/17 1528)   PRN Meds: acetaminophen **OR** acetaminophen, nitroGLYCERIN, ondansetron **OR** ondansetron (ZOFRAN) IV, senna-docusate   Vital Signs    Vitals:   03/29/17 1957 03/30/17 0012 03/30/17 0456 03/30/17 0930  BP: (!) 147/57 (!) 148/99 (!) 116/45 (!) 121/55  Pulse: (!) 56 84 63 60  Resp: 18 18 18 16   Temp: 98.4 F (36.9 C) 98.4 F (36.9 C) 98.3 F (36.8 C)   TempSrc: Oral Oral Oral   SpO2: 95% 100% 99% 98%  Weight:   172 lb 11.2 oz (78.3 kg)   Height:        Intake/Output Summary (Last 24 hours) at 03/30/17 1201 Last data filed at 03/30/17 1107  Gross per 24 hour  Intake              930 ml  Output             1100 ml  Net             -170 ml   Filed Weights   03/28/17 1751 03/29/17 0505 03/30/17 0456  Weight: 160 lb 0.9 oz (72.6 kg) 168 lb 1.6 oz (76.2 kg) 172 lb 11.2 oz (78.3 kg)    Telemetry    V paced rhythm - Personally Reviewed  ECG    A-fib, V paced rhythm - Personally Reviewed  Physical Exam   GEN: No acute distress.   Neck: No JVD Cardiac: RRR, no murmurs, rubs, or gallops.  Respiratory: Clear to auscultation bilaterally. GI: Soft, nontender, non-distended  MS: No edema; No deformity. Neuro:  Nonfocal  Psych: Normal affect   Labs    Chemistry  Recent Labs Lab 03/27/17 1111 03/28/17 1312 03/29/17 0456 03/30/17 0319  NA 136 134* 138 137  K 5.0 5.0 4.9 4.5  CL 105 104 111 111  CO2 24 22 22  21*  GLUCOSE 116* 109* 113* 183*  BUN 68* 64* 53*  41*  CREATININE 3.13* 2.54* 1.72* 1.38*  CALCIUM 9.9 9.9 9.2 8.9  PROT 6.3  --   --   --   ALBUMIN 3.9  --   --   --   AST 15  --   --   --   ALT 13  --   --   --   ALKPHOS 77  --   --   --   BILITOT 0.3  --   --   --   GFRNONAA 13* 16* 25* 33*  GFRAA 15* 18* 29* 38*  ANIONGAP  --  8 5 5      Hematology  Recent Labs Lab 03/28/17 1312 03/29/17 0456 03/30/17 0319  WBC 8.2 7.1 7.8  RBC 4.16 4.08 3.58*  HGB 12.1 11.7* 10.7*  HCT 36.5 36.1 31.6*  MCV 87.7 88.5 88.3  MCH 29.1 28.7 29.9  MCHC 33.2 32.4 33.9  RDW 15.5 15.7* 15.5  PLT 205 179 168    Cardiac EnzymesNo results for input(s): TROPONINI in  the last 168 hours.   Recent Labs Lab 03/28/17 1320  TROPIPOC 0.02     BNP  Recent Labs Lab 03/27/17 1112 03/28/17 1312  BNP  --  772.5*  PROBNP 2,510*  --      DDimer No results for input(s): DDIMER in the last 168 hours.   Radiology    Dg Chest 2 View  Result Date: 03/28/2017 CLINICAL DATA:  Generalized weakness and low blood pressure. Fall yesterday. EXAM: CHEST  2 VIEW COMPARISON:  June 08, 2015 FINDINGS: The heart, hila, and mediastinum are unchanged. No pneumothorax. Opacity and probable small effusion in the left base is similar in the interval. No significant change since February 2016. IMPRESSION: No acute interval change. Probable small effusion and atelectasis or scar in the left base. Electronically Signed   By: Dorise Bullion III M.D   On: 03/28/2017 13:41   Dg Ankle Complete Left  Result Date: 03/28/2017 CLINICAL DATA:  Fall yesterday.  Pain. EXAM: LEFT ANKLE COMPLETE - 3+ VIEW COMPARISON:  None. FINDINGS: There is no evidence of fracture, dislocation, or joint effusion. There is no evidence of arthropathy or other focal bone abnormality. Soft tissues are unremarkable. IMPRESSION: Negative. Electronically Signed   By: Dorise Bullion III M.D   On: 03/28/2017 13:42    Cardiac Studies   - Left ventricle: The cavity size was normal. Wall thickness  was   normal. Systolic function was moderately to severely reduced. The   estimated ejection fraction was in the range of 30% to 35%. - Aortic valve: Mildly calcified annulus. Mildly thickened, mildly   calcified leaflets. There was mild regurgitation. - Mitral valve: There was mild to moderate regurgitation. - Right ventricle: Systolic function was mildly to moderately   reduced. - Tricuspid valve: There was mild-moderate regurgitation. - Pulmonary arteries: Systolic pressure was moderately increased.   PA peak pressure: 46 mm Hg (S).     Patient Profile     83 y.o. female with known CMP, chronic combined systolic and diastolic CHF admitted with dehydration  Assessment & Plan    1. Acute on chronic diastolic/ Hypertensive heart disease  Admitted with volume contraction, dehydration, acute on chronic kidney failure Rehydrated Crea 3.1->2.5-> 1.7-->1.38, BUN 68->53-->41, K 4.5 - held lasix, spironolactone, hydralazine, lisinopril - she is now hypertensive, I will increase carvedilol to 12.5 mg po BID - plan for a discharge today on lasix 20 mg po BID - close follow up in our clinic with a PA this coming Friday and then in another 2-3 weeks.   2. Acute on chronic CKD stage IV - as above  3. HTN - increase carvedilol, hold lisinopril for now  4. Paroxysmal atrial fib - low burden by recent interrogation of pacemaker. Continue Eliquis - the dose was decreased with decreasing GFR, we will have to reconsider continuation with frequent fall in the last few weeks.   5. CAD- she is not describing any recurrent symptoms of angina. Continue BB, statin. Not on ASA due to ongoing Eliquis use.  Signed, Ena Dawley, MD  03/30/2017, 12:01 PM

## 2017-03-30 NOTE — Progress Notes (Addendum)
Patient ID: Jessica [name], female   DOB: 04-26-1927, 83 y.o.   MRN: [id number]    PROGRESS NOTE  Jessica [name]  PZW:[id number] DOB: 1927-02-20 DOA: 03/28/2017  PCP: Binnie Rail, MD   Brief Narrative:  Pt is 83 yo female with CAD, s/p stenting x 2, CHF, CKD stage III, paroxysmal a-fib on Apixaban, presented with weakness and dehydration in the setting of lasix use. Also concern for ? UTI.   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 - on SSI here, will resume home medical regimen on discharge   HLD - continue statin   HTN, essential - Coreg 12.5 mg PO BID ordered - holding spironolactone and lisinopril until renal function stabilizes   Paroxysmal a-fib - rate controlled - continue coreg and apixaban  CAD - stable this AM, no anginal symptoms   UTI - continue Rocephin for now and change to PO in AM as pt says had some nausea today  Left ankle and shin pain - this was likely from the fall, pt with high risk fall  - PT working with pt - HH PT ordered  - left tibia/fibula xray to make sure no fracture  - pt may need splint if any findings on xray - would need to have xray back before any discharge as pt is high fall risk    DVT prophylaxis: Apixaban Code Status: Full  Family Communication: Patient at bedside, daughter over the phone  Disposition Plan: home likely in AM   Consultants:   Cardiology   Procedures:   None  Antimicrobials:   Rocephin 5/26 -->  Subjective: Pt reports feeling better.    Objective: Vitals:   03/30/17 0012 03/30/17 0456 03/30/17 0930 03/30/17 1219  BP: (!) 148/99 (!) 116/45 (!) 121/55 (!) 138/49  Pulse: 84 63 60 60  Resp: 18 18 16 16   Temp: 98.4 F (36.9 C) 98.3 F (36.8 C)    TempSrc: Oral Oral  Oral  SpO2: 100% 99% 98% 97%  Weight:  78.3 kg (172 lb 11.2 oz)    Height:        Intake/Output Summary (Last 24 hours) at 03/30/17 1409 Last data filed at 03/30/17 1107  Gross per 24 hour  Intake              930 ml  Output             1100 ml  Net             -170 ml   Filed Weights   03/28/17 1751 03/29/17 0505 03/30/17 0456  Weight: 72.6 kg (160 lb 0.9 oz) 76.2 kg (168 lb 1.6 oz) 78.3 kg (172 lb 11.2 oz)    Physical Exam  Constitutional: Appears well-developed and well-nourished. No distress.  CVS: RRR, no gallops, no carotid bruit.  Pulmonary: Effort and breath sounds normal, no stridor, rhonchi, wheezes, rales.  Abdominal: Soft. BS +,  no distension, tenderness, rebound or guarding.  Musculoskeletal: Normal range of motion. No edema and no tenderness.    Data Reviewed: I have personally reviewed following labs and imaging studies  CBC:  Recent Labs Lab 03/27/17 1111 03/28/17 1312 03/29/17 0456 03/30/17 0319  WBC 10.5 8.2 7.1 7.8  NEUTROABS 7.6*  --   --   --   HGB  --  12.1 11.7* 10.7*  HCT 35.7 36.5 36.1 31.6*  MCV 86 87.7 88.5 88.3  PLT 207 205 179 379   Basic Metabolic Panel:  Recent Labs Lab 03/27/17 1111 03/28/17 1312 03/28/17 1603 03/29/17 0456 03/30/17 0319  NA 136 134*  --  138 137  K 5.0 5.0  --  4.9 4.5  CL 105 104  --  111 111  CO2 24 22  --  22 21*  GLUCOSE 116* 109*  --  113* 183*  BUN 68* 64*  --  53* 41*  CREATININE 3.13* 2.54*  --  1.72* 1.38*  CALCIUM 9.9 9.9  --  9.2 8.9  MG  --   --  2.3  --   --   PHOS  --   --  3.5  --   --    Liver Function Tests:  Recent Labs Lab 03/27/17 1111  AST 15  ALT 13  ALKPHOS 77  BILITOT 0.3  PROT 6.3  ALBUMIN 3.9   Cardiac Enzymes:  Recent  Labs Lab 03/28/17 1603  CKTOTAL 71   BNP (last 3 results)  Recent Labs  02/11/17 1330 02/17/17 1616 03/27/17 1112  PROBNP 7,753* 4,735* 2,510*   CBG:  Recent Labs Lab 03/29/17 1149 03/29/17 1623 03/29/17 2140 03/30/17 0736 03/30/17 1138  GLUCAP 147* 162* 152* 102* 167*   Urine analysis:    Component Value Date/Time   COLORURINE YELLOW 03/28/2017 1443   APPEARANCEUR CLOUDY (A) 03/28/2017 1443   LABSPEC 1.006 03/28/2017 1443   PHURINE 6.0 03/28/2017 1443   GLUCOSEU NEGATIVE 03/28/2017 1443   HGBUR NEGATIVE 03/28/2017 1443   BILIRUBINUR NEGATIVE 03/28/2017 1443   KETONESUR NEGATIVE 03/28/2017 1443   PROTEINUR NEGATIVE 03/28/2017 1443   UROBILINOGEN 1.0 09/12/2013 0639   NITRITE NEGATIVE 03/28/2017 1443   LEUKOCYTESUR LARGE (A) 03/28/2017 1443   Recent Results (from the past 240 hour(s))  Urine culture     Status: Abnormal   Collection Time: 03/28/17  2:48 PM  Result Value Ref Range Status   Specimen Description URINE, RANDOM  Final   Special Requests NONE  Final   Culture 40,000 COLONIES/mL ENTEROBACTER SPECIES (A)  Final   Report Status 03/30/2017 FINAL  Final   Organism ID, Bacteria ENTEROBACTER SPECIES (A)  Final      Susceptibility   Enterobacter species - MIC*    CEFAZOLIN RESISTANT Resistant     CEFTRIAXONE <=1 SENSITIVE Sensitive     CIPROFLOXACIN <=0.25 SENSITIVE Sensitive     GENTAMICIN <=1 SENSITIVE Sensitive     IMIPENEM <=0.25 SENSITIVE Sensitive     NITROFURANTOIN <=16 SENSITIVE Sensitive     TRIMETH/SULFA <=20 SENSITIVE Sensitive     PIP/TAZO <=4 SENSITIVE Sensitive     * 40,000 COLONIES/mL ENTEROBACTER SPECIES    Radiology Studies: No results found.  Scheduled Meds: . apixaban  2.5 mg Oral BID  . carvedilol  12.5 mg Oral BID WC  . furosemide  20 mg Oral BID  . gabapentin  300 mg Oral BID  . insulin aspart  0-9 Units Subcutaneous TID WC  . rosuvastatin  10 mg Oral Daily  . sodium chloride flush  3 mL Intravenous Q12H   Continuous  Infusions: . cefTRIAXone (ROCEPHIN)  IV Stopped (03/29/17 1528)     LOS: 0 days   Time spent: 35 minutes   Faye Ramsay, MD [facility] Pager [phone]  If 7PM-7AM, please contact night-coverage [email or web address] Password [email or web address] 03/30/2017, 2:09 PM

## 2017-03-30 NOTE — Care Management Obs Status (Signed)
[facility] NOTIFICATION   Patient Details  Name: Jessica [name] MRN: [id number] Date of Birth: 12-12-1926   Medicare Observation Status Notification Given:  Yes Met with pt and daughter as pt is HOH, daughter verbalized understanding of OBS status.    Korin Hartwell, Rory Percy, RN 03/30/2017, 11:42 AM

## 2017-03-30 NOTE — Evaluation (Signed)
Occupational Therapy Evaluation Patient Details Name: Jessica [name] MRN: [id number] DOB: 16-Aug-1927 Today's Date: 03/30/2017    History of Present Illness Pt is a 83 yo female admitted through ED with weakness and fatigue. Pt was admitted with dehydration and a UTI with ARF of CKD III. PMH significant for Severe CAD with 2 stents placed, CHF, CKD III, DM, GERD, HLD, HTN.    Clinical Impression   This 83 y/o F presents with the above. Pt lives with her daughter, and at baseline is Modified independent with functional mobility and ADLs, with intermittent assist from daughter for completing bathing and dressing. Pt currently requires MinA for functional mobility and MinGuard assist for ADLs, limited mostly by decreased activity tolerance and pain in L foot during this session. Pt will benefit from continued OT services to maximize Pt's activity tolerance, safety and independence with completing ADLs and functional mobility. Will follow.     Follow Up Recommendations  Home health OT;Supervision/Assistance - 24 hour    Equipment Recommendations  None recommended by OT           Precautions / Restrictions Precautions Precautions: Fall Restrictions Weight Bearing Restrictions: No      Mobility Bed Mobility Overal bed mobility: Needs Assistance Bed Mobility: Supine to Sit     Supine to sit: Min assist Sit to supine: Min assist   General bed mobility comments: assist to bring trunk upright, for scooting further to EOB  Transfers Overall transfer level: Needs assistance Equipment used: Rolling walker (2 wheeled) Transfers: Sit to/from Stand Sit to Stand: Min assist         General transfer comment: assist to power up into standing, Pt demonstrates safe hand placement during sit to stand transfers     Balance Overall balance assessment: Needs assistance Sitting-balance support: No upper extremity supported;Feet supported Sitting balance-Leahy Scale: Good     Standing  balance support: Bilateral upper extremity supported;During functional activity Standing balance-Leahy Scale: Poor Standing balance comment: reliant on RW for stability in standing; reliant on sink to support UE during grooming ADLs                           ADL either performed or assessed with clinical judgement   ADL Overall ADL's : Needs assistance/impaired Eating/Feeding: Set up;Sitting   Grooming: Wash/dry hands;Min guard;Standing   Upper Body Bathing: Set up;Sitting   Lower Body Bathing: Min guard;Sit to/from stand   Upper Body Dressing : Set up;Sitting   Lower Body Dressing: Min guard;Sit to/from stand Lower Body Dressing Details (indicate cue type and reason): Pt demonstrates figure 4 technique to complete doffing/donning socks  Toilet Transfer: Minimal assistance;Ambulation;Regular Toilet;Grab bars;RW [employer] forces technical officer Details (indicate cue type and reason): assist to rise and lower  Toileting- Clothing Manipulation and Hygiene: Minimal assistance;Sit to/from stand       Functional mobility during ADLs: Minimal assistance;Rolling walker General ADL Comments: Pt completed bed mobility, room level funcitonal mobility, toilet transfer, toileting, standing grooming ADLs, LB dressing sitting EOB; Pt demonstrates good safety awareness during task completion                          Pertinent Vitals/Pain Pain Assessment: Faces Faces Pain Scale: Hurts a little bit Pain Location: L foot, L big toe  Pain Descriptors / Indicators: Sore;Guarding Pain Intervention(s): Limited activity within patient's tolerance;Monitored during session     Hand Dominance Right   Extremity/Trunk Assessment Upper  Extremity Assessment Upper Extremity Assessment: LUE deficits/detail;Generalized weakness LUE Deficits / Details: general weakness LUE >RUE; limited AROM but remains WFL   Lower Extremity Assessment Lower Extremity Assessment: Defer to PT evaluation   Cervical /  Trunk Assessment Cervical / Trunk Assessment: Normal   Communication Communication Communication: HOH   Cognition Arousal/Alertness: Awake/alert Behavior During Therapy: WFL for tasks assessed/performed Overall Cognitive Status: Within Functional Limits for tasks assessed                                     General Comments  daughter present during session                Home Living Family/patient expects to be discharged to:: Private residence Living Arrangements: Children Available Help at Discharge: Family;Available 24 hours/day Type of Home: House Home Access: Level entry     Home Layout: One level     Bathroom Shower/Tub: Walk-in shower;Tub/shower unit   Bathroom Toilet: Standard Bathroom Accessibility: Yes   Home Equipment: Shower seat;Shower seat - built in;Bedside commode;Walker - 2 wheels;Walker - 4 wheels;Grab bars - tub/shower          Prior Functioning/Environment Level of Independence: Needs assistance  Gait / Transfers Assistance Needed: uses RW for gait.  ADL's / Homemaking Assistance Needed: daugther assists with housekeeping and cooking. occasional assistance with dressing and bathing Communication / Swallowing Assistance Needed: no diffiuclty          OT Problem List: Decreased strength;Decreased activity tolerance;Impaired balance (sitting and/or standing);Pain      OT Treatment/Interventions: Self-care/ADL training;DME and/or AE instruction;Therapeutic activities;Balance training;Therapeutic exercise;Energy conservation;Patient/family education    OT Goals(Current goals can be found in the care plan section) Acute Rehab OT Goals Patient Stated Goal: to feel better OT Goal Formulation: With patient Time For Goal Achievement: 04/13/17 Potential to Achieve Goals: Good ADL Goals Pt Will Perform Grooming: with modified independence;standing Pt Will Perform Lower Body Bathing: with supervision;sit to/from stand Pt Will  Perform Upper Body Dressing: with modified independence;sitting Pt Will Perform Lower Body Dressing: with supervision;sit to/from stand Pt Will Transfer to Toilet: ambulating;regular height toilet;grab bars;with modified independence (BSC over toilet ) Pt Will Perform Toileting - Clothing Manipulation and hygiene: with modified independence;sit to/from stand Pt/caregiver will Perform Home Exercise Program: Increased strength;Both right and left upper extremity;With Supervision  OT Frequency: Min 2X/week                             AM-PAC PT "6 Clicks" Daily Activity     Outcome Measure Help from another person eating meals?: None Help from another person taking care of personal grooming?: A Little Help from another person toileting, which includes using toliet, bedpan, or urinal?: A Little Help from another person bathing (including washing, rinsing, drying)?: A Little Help from another person to put on and taking off regular upper body clothing?: A Little Help from another person to put on and taking off regular lower body clothing?: A Little 6 Click Score: 19   End of Session Equipment Utilized During Treatment: Gait belt;Rolling walker  Activity Tolerance: Patient tolerated treatment well Patient left: in bed;with call bell/phone within reach;with family/visitor present (Pt sitting EOB, PT entering room to begin session )  OT Visit Diagnosis: Muscle weakness (generalized) (M62.81);Unsteadiness on feet (R26.81);Pain Pain - Right/Left: Left Pain - part of body: Ankle and joints of foot  Time: 4360-6770 OT Time Calculation (min): 23 min Charges:  OT General Charges $OT Visit: 1 Procedure OT Evaluation $OT Eval Low Complexity: 1 Procedure G-Codes: OT G-codes **NOT FOR INPATIENT CLASS** Functional Assessment Tool Used: AM-PAC 6 Clicks Daily Activity;Clinical judgement Functional Limitation: Self care Self Care Current Status (H4035): At least 20 percent but  less than 40 percent impaired, limited or restricted Self Care Goal Status (C4818): At least 1 percent but less than 20 percent impaired, limited or restricted   Lou Cal, OT Pager [phone] 03/30/2017   Raymondo Band 03/30/2017, 2:50 PM

## 2017-03-31 ENCOUNTER — Other Ambulatory Visit: Payer: Medicare Other

## 2017-03-31 DIAGNOSIS — I2510 Atherosclerotic heart disease of native coronary artery without angina pectoris: Secondary | ICD-10-CM | POA: Diagnosis not present

## 2017-03-31 DIAGNOSIS — N170 Acute kidney failure with tubular necrosis: Secondary | ICD-10-CM | POA: Diagnosis not present

## 2017-03-31 DIAGNOSIS — I110 Hypertensive heart disease with heart failure: Secondary | ICD-10-CM | POA: Diagnosis not present

## 2017-03-31 DIAGNOSIS — I480 Paroxysmal atrial fibrillation: Secondary | ICD-10-CM | POA: Diagnosis not present

## 2017-03-31 DIAGNOSIS — N183 Chronic kidney disease, stage 3 (moderate): Secondary | ICD-10-CM | POA: Diagnosis not present

## 2017-03-31 LAB — GLUCOSE, CAPILLARY: Glucose-Capillary: 116 mg/dL — ABNORMAL HIGH (ref 65–99)

## 2017-03-31 LAB — BASIC METABOLIC PANEL
Anion gap: 8 (ref 5–15)
BUN: 31 mg/dL — ABNORMAL HIGH (ref 6–20)
CALCIUM: 9.2 mg/dL (ref 8.9–10.3)
CO2: 20 mmol/L — ABNORMAL LOW (ref 22–32)
CREATININE: 1.35 mg/dL — AB (ref 0.44–1.00)
Chloride: 111 mmol/L (ref 101–111)
GFR calc non Af Amer: 34 mL/min — ABNORMAL LOW (ref >60)
GFR, EST AFRICAN AMERICAN: 39 mL/min — AB (ref >60)
Glucose, Bld: 116 mg/dL — ABNORMAL HIGH (ref 65–99)
Potassium: 4.7 mmol/L (ref 3.5–5.1)
SODIUM: 139 mmol/L (ref 135–145)

## 2017-03-31 LAB — CBC
HCT: 35.8 % — ABNORMAL LOW (ref 36.0–46.0)
Hemoglobin: 11.8 g/dL — ABNORMAL LOW (ref 12.0–15.0)
MCH: 29.1 pg (ref 26.0–34.0)
MCHC: 33 g/dL (ref 30.0–36.0)
MCV: 88.2 fL (ref 78.0–100.0)
PLATELETS: 173 10*3/uL (ref 150–400)
RBC: 4.06 MIL/uL (ref 3.87–5.11)
RDW: 15.7 % — AB (ref 11.5–15.5)
WBC: 9.5 10*3/uL (ref 4.0–10.5)

## 2017-03-31 LAB — HEMOGLOBIN A1C
Hgb A1c MFr Bld: 6.6 % — ABNORMAL HIGH (ref 4.8–5.6)
Mean Plasma Glucose: 143 mg/dL

## 2017-03-31 MED ORDER — FUROSEMIDE 20 MG PO TABS
20.0000 mg | ORAL_TABLET | Freq: Every day | ORAL | Status: DC
Start: 2017-04-01 — End: 2017-03-31

## 2017-03-31 MED ORDER — FUROSEMIDE 20 MG PO TABS: 20.0000 mg | ORAL_TABLET | Freq: Two times a day (BID) | ORAL | 0 refills | Status: DC

## 2017-03-31 MED ORDER — CIPROFLOXACIN HCL 500 MG PO TABS: 500.0000 mg | ORAL_TABLET | Freq: Two times a day (BID) | ORAL | 0 refills | Status: DC

## 2017-03-31 MED ORDER — CARVEDILOL 12.5 MG PO TABS: 12.5000 mg | ORAL_TABLET | Freq: Two times a day (BID) | ORAL | 0 refills | Status: DC

## 2017-03-31 NOTE — Progress Notes (Signed)
Physical Therapy Treatment Patient Details Name: Jessica [name] MRN: [id number] DOB: 12-24-1926 Today's Date: 03/31/2017    History of Present Illness Pt is a 83 yo female admitted through ED with weakness and fatigue. Pt was admitted with dehydration and a UTI with ARF of CKD III. PMH significant for Severe CAD with 2 stents placed, CHF, CKD III, DM, GERD, HLD, HTN.     PT Comments    Patient continues to c/o pain in L great toe however able to ambulate 70ft this am. Pt does demonstrate balance deficits.  Continue to progress as tolerated with anticipated d/c home with HHPT.   Follow Up Recommendations  Home health PT     Equipment Recommendations  None recommended by PT    Recommendations for Other Services       Precautions / Restrictions Precautions Precautions: Fall Restrictions Weight Bearing Restrictions: No    Mobility  Bed Mobility Overal bed mobility: Needs Assistance Bed Mobility: Supine to Sit     Supine to sit: Modified independent (Device/Increase time)     General bed mobility comments: increased time and effort; use of bed rail to elevate trunk   Transfers Overall transfer level: Needs assistance Equipment used: Rolling walker (2 wheeled) Transfers: Sit to/from Stand Sit to Stand: Min assist         General transfer comment: assist to power up into standing; cues for safe hand placement  Ambulation/Gait Ambulation/Gait assistance: Min guard Ambulation Distance (Feet): 80 Feet Assistive device: Rolling walker (2 wheeled) Gait Pattern/deviations: Decreased stride length;Decreased dorsiflexion - right;Decreased dorsiflexion - left;Trunk flexed;Step-through pattern Gait velocity: decreased   General Gait Details: cues for posture and proximity of RW   Stairs            Wheelchair Mobility    Modified Rankin (Stroke Patients Only)       Balance Overall balance assessment: Needs assistance Sitting-balance support: No upper  extremity supported;Feet supported Sitting balance-Leahy Scale: Good     Standing balance support: Bilateral upper extremity supported;During functional activity Standing balance-Leahy Scale: Poor Standing balance comment: reliant on RW for stability in standing; pt with posterior LOB when attempting to pull up underwear requiring assist to steady and cues to keep single UE on RW              High level balance activites: Turns;Head turns;Backward walking High Level Balance Comments: pt requires stopping before performing horizontal head turns and with posterior bias when taking backwards steps            Cognition Arousal/Alertness: Awake/alert Behavior During Therapy: WFL for tasks assessed/performed Overall Cognitive Status: Within Functional Limits for tasks assessed                                        Exercises      General Comments        Pertinent Vitals/Pain Pain Assessment: Faces Faces Pain Scale: Hurts little more Pain Location: L great toe Pain Descriptors / Indicators: Sore;Guarding;Grimacing Pain Intervention(s): Limited activity within patient's tolerance;Monitored during session;Repositioned    Home Living                      Prior Function            PT Goals (current goals can now be found in the care plan section) Acute Rehab PT Goals Patient Stated Goal: to feel  better PT Goal Formulation: With patient Time For Goal Achievement: 04/05/17 Potential to Achieve Goals: Good Progress towards PT goals: Progressing toward goals    Frequency    Min 3X/week      PT Plan Current plan remains appropriate    Co-evaluation              AM-PAC PT "6 Clicks" Daily Activity  Outcome Measure  Difficulty turning over in bed (including adjusting bedclothes, sheets and blankets)?: None Difficulty moving from lying on back to sitting on the side of the bed? : Total Difficulty sitting down on and standing up from  a chair with arms (e.g., wheelchair, bedside commode, etc,.)?: Total Help needed moving to and from a bed to chair (including a wheelchair)?: A Little Help needed walking in hospital room?: A Little Help needed climbing 3-5 steps with a railing? : A Little 6 Click Score: 15    End of Session Equipment Utilized During Treatment: Gait belt Activity Tolerance: Patient tolerated treatment well Patient left: with call bell/phone within reach;in chair;with chair alarm set Nurse Communication: Mobility status PT Visit Diagnosis: Unsteadiness on feet (R26.81);Muscle weakness (generalized) (M62.81);Difficulty in walking, not elsewhere classified (R26.2)     Time: 1594-7076 PT Time Calculation (min) (ACUTE ONLY): 33 min  Charges:  $Gait Training: 8-22 mins $Therapeutic Activity: 8-22 mins                    G Codes:       Earney Navy, PTA Pager: [phone]     Darliss Cheney 03/31/2017, 9:22 AM

## 2017-03-31 NOTE — Progress Notes (Signed)
No complaints or concerns from patient, patient stable throughout the night.

## 2017-03-31 NOTE — Progress Notes (Signed)
Progress Note  Patient Name: Jessica [name] Date of Encounter: 03/31/2017  Primary Cardiologist: Dr [name]  Subjective   Pt seen getting up with PT. She complains of pain in her left great toe. Otherwise breathing is good and no chest pain.   Inpatient Medications    Scheduled Meds: . apixaban  2.5 mg Oral BID  . carvedilol  12.5 mg Oral BID WC  . [START ON 04/01/2017] furosemide  20 mg Oral Daily  . gabapentin  300 mg Oral BID  . insulin aspart  0-9 Units Subcutaneous TID WC  . rosuvastatin  10 mg Oral Daily  . sodium chloride flush  3 mL Intravenous Q12H   Continuous Infusions: . cefTRIAXone (ROCEPHIN)  IV Stopped (03/30/17 1544)   PRN Meds: acetaminophen **OR** acetaminophen, nitroGLYCERIN, ondansetron **OR** ondansetron (ZOFRAN) IV, senna-docusate   Vital Signs    Vitals:   03/30/17 0930 03/30/17 1219 03/30/17 1955 03/31/17 0400  BP: (!) 121/55 (!) 138/49 101/62 127/65  Pulse: 60 60 62 71  Resp: 16 16 18 18   Temp:   98 F (36.7 C) 97.7 F (36.5 C)  TempSrc:  Oral Oral Oral  SpO2: 98% 97% 98% 97%  Weight:    158 lb 12.8 oz (72 kg)  Height:        Intake/Output Summary (Last 24 hours) at 03/31/17 0849 Last data filed at 03/31/17 0600  Gross per 24 hour  Intake             1133 ml  Output              550 ml  Net              583 ml   Filed Weights   03/29/17 0505 03/30/17 0456 03/31/17 0400  Weight: 168 lb 1.6 oz (76.2 kg) 172 lb 11.2 oz (78.3 kg) 158 lb 12.8 oz (72 kg)    Telemetry    Atrial fibrillation in the 70's with occ Vpacing - Personally Reviewed  ECG    No new tracings  Physical Exam   GEN: No acute distress.   Neck: No JVD Cardiac: RRR, no murmurs, rubs, or gallops.  Respiratory: Clear to auscultation bilaterally. GI: Soft, nontender, non-distended  MS: No edema; No deformity. Neuro:  Nonfocal  Psych: Normal affect   Labs    Chemistry  Recent Labs Lab 03/27/17 1111  03/29/17 0456 03/30/17 0319 03/31/17 0636  NA 136  <  > 138 137 139  K 5.0  < > 4.9 4.5 4.7  CL 105  < > 111 111 111  CO2 24  < > 22 21* 20*  GLUCOSE 116*  < > 113* 183* 116*  BUN 68*  < > 53* 41* 31*  CREATININE 3.13*  < > 1.72* 1.38* 1.35*  CALCIUM 9.9  < > 9.2 8.9 9.2  PROT 6.3  --   --   --   --   ALBUMIN 3.9  --   --   --   --   AST 15  --   --   --   --   ALT 13  --   --   --   --   ALKPHOS 77  --   --   --   --   BILITOT 0.3  --   --   --   --   GFRNONAA 13*  < > 25* 33* 34*  GFRAA 15*  < > 29* 38* 39*  ANIONGAP  --   < >  5 5 8   < > = values in this interval not displayed.   Hematology  Recent Labs Lab 03/29/17 0456 03/30/17 0319 03/31/17 0636  WBC 7.1 7.8 9.5  RBC 4.08 3.58* 4.06  HGB 11.7* 10.7* 11.8*  HCT 36.1 31.6* 35.8*  MCV 88.5 88.3 88.2  MCH 28.7 29.9 29.1  MCHC 32.4 33.9 33.0  RDW 15.7* 15.5 15.7*  PLT 179 168 173    Cardiac EnzymesNo results for input(s): TROPONINI in the last 168 hours.   Recent Labs Lab 03/28/17 1320  TROPIPOC 0.02     BNP  Recent Labs Lab 03/27/17 1112 03/28/17 1312  BNP  --  772.5*  PROBNP 2,510*  --      DDimer No results for input(s): DDIMER in the last 168 hours.   Radiology    Dg Tibia/fibula Left  Result Date: 03/30/2017 CLINICAL DATA:  Pain EXAM: LEFT TIBIA AND FIBULA - 2 VIEW COMPARISON:  LEFT ankle radiographs 03/28/2017 FINDINGS: Diffuse osseous demineralization. Knee and ankle joint alignments normal. No acute fracture, dislocation, or bone destruction. Scattered soft tissue swelling. IMPRESSION: Osseous demineralization without acute bony abnormality. Electronically Signed   By: Lavonia Dana M.D.   On: 03/30/2017 17:16    Cardiac Studies   Echo 02/24/2017 - Left ventricle: The cavity size was normal. Wall thickness was   normal. Systolic function was moderately to severely reduced. The   estimated ejection fraction was in the range of 30% to 35%. - Aortic valve: Mildly calcified annulus. Mildly thickened, mildly   calcified leaflets. There was mild  regurgitation. - Mitral valve: There was mild to moderate regurgitation. - Right ventricle: Systolic function was mildly to moderately   reduced. - Tricuspid valve: There was mild-moderate regurgitation. - Pulmonary arteries: Systolic pressure was moderately increased.   PA peak pressure: 46 mm Hg (S).  Patient Profile     83 y.o. female with known CMP, chronic combined systolic and diastolic CHF admitted with dehydration  Assessment & Plan    1. Acute on chronic diastolic/ Hypertensive heart disease  Admitted with volume contraction, dehydration, acute on chronic kidney failure Rehydrated Crea 3.1->2.5-> 1.7-->1.38-->1.35, BUN 68->53-->41-->31, K 4.7 - held lasix, spironolactone, hydralazine, lisinopril - she is now hypertensive, I will increase carvedilol to 12.5 mg po BID - plan for a discharge today on lasix 20 mg po BID - close follow up in our clinic with a PA this coming Friday (5/31) and then in another 2-3 weeks.   2. Acute on chronic CKD stage IV - as above  3. HTN - increased carvedilol to 12.5 mg bid yesterday, hold lisinopril for now. BP now improved  4. Paroxysmal atrial fib - Currently in atrial fib in the 70's with good rate control, low afib burden by recent interrogation of pacemaker. Continue Eliquis - the dose was decreased with decreasing GFR, we will have to reconsider continuation with frequent fall in the last few weeks.   5. CAD- she is not describing any recurrent symptoms of angina. Continue BB, statin. Not on ASA due to ongoing Eliquis use.  6. Leg pain -Xrays negative for fracture. Pt worked with PT/OT yesterday with no increased pain in LLE and reported feeling mostly neuropathic pain in bilateral feet which is her norm. Recommendation for home PT/OT.  Signed, Daune Perch, NP  03/31/2017, 8:49 AM    The patient was seen, examined and discussed with Daune Perch, NP-C and I agree with the above.   The patient is ready for a discharge,  Crea is  stable at 1.3 - baseline, home dose of lasix will be 20 mg po daily and extra 20 mg po in the afternoons if her weight goes 3 lbs overnight or 5 lbs in a week. Baseline weight 170 lbs. Discussed with her daughter she agrees. She underwent X ray of her leg, there is no fracture and she was able to walk 100 ft without difficulties, primary team is arranging home physical therapy.  Ena Dawley, MD 03/31/2017

## 2017-03-31 NOTE — Progress Notes (Signed)
Pt discharge education provided at bedside with pt daughter. Pt IV discontinued, catheter intact and telemetry removed. Pt has all belongings including discharge paperwork and printed prescriptions. Pt discharged via wheelchair with volunteer services.   Jessica [name]

## 2017-03-31 NOTE — Discharge Summary (Signed)
Physician Discharge Summary  Jessica [name] KDX:[id number] DOB: 03-12-1927 DOA: 03/28/2017  PCP: Jessica Rail, MD  Admit date: 03/28/2017 Discharge date: 03/31/2017  Recommendations for Outpatient Follow-up:  1. Pt will need to follow up with PCP in 1-2 weeks post discharge 2. Please obtain BMP to evaluate electrolytes and kidney function, potassium level  3. Please also check CBC to evaluate Hg and Hct levels 4. Complete therapy for UTI with Cipro for 4 more days post duscharge  5. Please also note, pt advised to stop taking Lisinopril and Spironolactone until seen by primary provider 6. Please also note that dose of Lasix has been changed to 20 mg tablet twice daily per cardiology team recommendations  7. Coreg dose was increased to 12.5 mg twice daily, per cardiology team recommendations   Discharge Diagnoses:  Principal Problem:   Acute renal failure superimposed on stage 3 chronic kidney disease ([place]) Active Problems:   Diabetes ([place])   HYPERCHOLESTEROLEMIA   Benign hypertensive heart disease with heart failure (HCC)   Hypertension   SSS (sick sinus syndrome) ([place])  Discharge Condition: Stable  Diet recommendation: Heart healthy diet discussed in details   Brief Narrative:  Pt is 83 yo female with CAD, s/p stenting x 2, CHF, CKD stage III, paroxysmal a-fib on Apixaban, presented with weakness and dehydration in the setting of lasix use. Also concern for ? UTI.   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 - complete therapy with Cipro upon discharge   Left ankle and shin pain - no fractures on xray, HH PT requested  - pt reports less pain   DVT prophylaxis: Apixaban Code Status: Full  Family Communication: Daugher and pt updated at bedside Disposition Plan: home   Consultants:   Cardiology   Procedures:   None  Antimicrobials:   Rocephin 5/26 --> changed to Cipro on discharge    Procedures/Studies: Dg Chest 2 View  Result Date: 03/28/2017 CLINICAL DATA:  Generalized weakness and low blood pressure. Fall yesterday. EXAM: CHEST  2 VIEW COMPARISON:  June 08, 2015 FINDINGS: The heart, hila, and mediastinum are unchanged. No pneumothorax. Opacity and probable small effusion in the left base is similar in the interval. No significant change since February 2016. IMPRESSION: No acute interval change. Probable small effusion and atelectasis or scar in the left base. Electronically Signed   By: Dorise Bullion III M.D   On: 03/28/2017 13:41   Dg Tibia/fibula Left  Result Date: 03/30/2017 CLINICAL DATA:  Pain EXAM: LEFT TIBIA AND FIBULA - 2 VIEW  COMPARISON:  LEFT ankle radiographs 03/28/2017 FINDINGS: Diffuse osseous demineralization. Knee and ankle joint alignments normal. No acute fracture, dislocation, or bone destruction. Scattered soft tissue swelling. IMPRESSION: Osseous demineralization without acute bony abnormality. Electronically Signed   By: Lavonia Dana M.D.   On: 03/30/2017 17:16   Dg Ankle Complete Left  Result Date: 03/28/2017 CLINICAL DATA:  Fall yesterday.  Pain. EXAM: LEFT ANKLE  COMPLETE - 3+ VIEW COMPARISON:  None. FINDINGS: There is no evidence of fracture, dislocation, or joint effusion. There is no evidence of arthropathy or other focal bone abnormality. Soft tissues are unremarkable. IMPRESSION: Negative. Electronically Signed   By: Dorise Bullion III M.D   On: 03/28/2017 13:42     Discharge Exam: Vitals:   03/31/17 0400 03/31/17 0923  BP: 127/65 (!) 122/56  Pulse: 71 74  Resp: 18 16  Temp: 97.7 F (36.5 C)    Vitals:   03/30/17 1219 03/30/17 1955 03/31/17 0400 03/31/17 0923  BP: (!) 138/49 101/62 127/65 (!) 122/56  Pulse: 60 62 71 74  Resp: _0 Temp:  98 F (36.7 C) 97.7 F (36.5 C)   TempSrc: Oral Oral Oral   SpO2: 97% 98% 97% 96%  Weight:   72 kg (158 lb 12.8 oz)   Height:        General: Pt is alert, follows commands appropriately, not in acute distress Cardiovascular: Regular rate and rhythm, S1/S2 +, no rubs, no gallops Respiratory: Clear to auscultation bilaterally, no wheezing, no crackles, no rhonchi Abdominal: Soft, non tender, non distended, bowel sounds +, no guarding  Discharge Instructions  Discharge Instructions    Diet - low sodium heart healthy    Complete by:  As directed    Diet - low sodium heart healthy    Complete by:  As directed    Increase activity slowly    Complete by:  As directed    Increase activity slowly    Complete by:  As directed      Allergies as of 03/31/2017      Reactions   Lyrica [pregabalin] Swelling   Caused weight gain   Amlodipine Swelling   Sulfonamide Derivatives Rash      Medication List    STOP taking these medications   lisinopril 2.5 MG tablet Commonly known as:  PRINIVIL,ZESTRIL   spironolactone 25 MG tablet Commonly known as:  ALDACTONE     TAKE these medications   apixaban 2.5 MG Tabs tablet Commonly known as:  ELIQUIS Take 1 tablet (2.5 mg total) by mouth 2 (two) times daily.   b complex vitamins tablet Take 1 tablet by mouth daily.   blood glucose meter  kit and supplies Kit Dispense based on patient and insurance preference. Test sugars once daily and prn as directed. (FOR ICD-9 250.00, 250.01).   CALCIUM CITRATE + D PO Take 1 tablet by mouth daily with breakfast.   carvedilol 12.5 MG tablet Commonly known as:  COREG Take 1 tablet (12.5 mg total) by mouth 2 (two) times daily. What changed:  medication strength  how much to take   ciprofloxacin 500 MG tablet Commonly known as:  CIPRO Take 1 tablet (500 mg total) by mouth 2 (two) times daily.   ECHINACEA PO Take 750 mg by mouth daily.   fish oil-omega-3 fatty acids 1000 MG capsule Take 1 g by mouth every morning.   furosemide 20 MG tablet Commonly known as:  LASIX Take 1 tablet (20 mg total) by mouth 2 (  two) times daily. What changed:  medication strength  how much to take  when to take this   gabapentin 100 MG capsule Commonly known as:  NEURONTIN Take 1 capsule (100 mg total) by mouth at bedtime. Take in addition to the 300 mg cap you are already taking at night What changed:  additional instructions   gabapentin 300 MG capsule Commonly known as:  NEURONTIN TAKE ONE CAPSULE BY MOUTH TWICE DAILY What changed:  Another medication with the same name was changed. Make sure you understand how and when to take each.   metFORMIN 500 MG tablet Commonly known as:  GLUCOPHAGE TAKE ONE TABLET BY MOUTH ONCE DAILY WITH BREAKFAST What changed:  how much to take  how to take this  when to take this  additional instructions   nitroGLYCERIN 0.4 MG SL tablet Commonly known as:  NITROSTAT Place 1 tablet (0.4 mg total) under the tongue every 5 (five) minutes as needed for chest pain. x3 doses as needed for chest pain What changed:  additional instructions   ROLLATOR ULTRA-LIGHT Misc With seat.  Diabetic neuropathy, knee arthritis   rosuvastatin 10 MG tablet Commonly known as:  CRESTOR TAKE ONE TABLET BY MOUTH ONCE DAILY IN THE MORNING       Follow-up Information     Jessica Rail, MD Follow up.   Specialty:  Internal Medicine Contact information: [place] [address] [phone]        [name], MD. Schedule an appointment as soon as possible for a visit.   Specialty:  Cardiology Contact information: [address] [place] [address] [phone]        Theodis Blaze, MD Follow up.   Specialty:  Internal Medicine Why:  call me questions please [phone] Contact information: [address] [place] [address] [phone]            The results of significant diagnostics from this hospitalization (including imaging, microbiology, ancillary and laboratory) are listed below for reference.     Microbiology: Recent Results (from the past 240 hour(s))  Urine culture     Status: Abnormal   Collection Time: 03/28/17  2:48 PM  Result Value Ref Range Status   Specimen Description URINE, RANDOM  Final   Special Requests NONE  Final   Culture 40,000 COLONIES/mL ENTEROBACTER SPECIES (A)  Final   Report Status 03/30/2017 FINAL  Final   Organism ID, Bacteria ENTEROBACTER SPECIES (A)  Final      Susceptibility   Enterobacter species - MIC*    CEFAZOLIN RESISTANT Resistant     CEFTRIAXONE <=1 SENSITIVE Sensitive     CIPROFLOXACIN <=0.25 SENSITIVE Sensitive     GENTAMICIN <=1 SENSITIVE Sensitive     IMIPENEM <=0.25 SENSITIVE Sensitive     NITROFURANTOIN <=16 SENSITIVE Sensitive     TRIMETH/SULFA <=20 SENSITIVE Sensitive     PIP/TAZO <=4 SENSITIVE Sensitive     * 40,000 COLONIES/mL ENTEROBACTER SPECIES     Labs: Basic Metabolic Panel:  Recent Labs Lab 03/27/17 1111 03/28/17 1312 03/28/17 1603 03/29/17 0456 03/30/17 0319 03/31/17 0636  NA 136 134*  --  138 137 139  K 5.0 5.0  --  4.9 4.5 4.7  CL 105 104  --  111 111 111  CO2 24 22  --  22 21* 20*  GLUCOSE 116* 109*  --  113* 183* 116*  BUN 68* 64*  --  53* 41* 31*  CREATININE 3.13*  2.54*  --  1.72*  1.38* 1.35*  CALCIUM 9.9 9.9  --  9.2 8.9 9.2  MG  --   --  2.3  --   --   --   PHOS  --   --  3.5  --   --   --    Liver Function Tests:  Recent Labs Lab 03/27/17 1111  AST 15  ALT 13  ALKPHOS 77  BILITOT 0.3  PROT 6.3  ALBUMIN 3.9   No results for input(s): LIPASE, AMYLASE in the last 168 hours. No results for input(s): AMMONIA in the last 168 hours. CBC:  Recent Labs Lab 03/27/17 1111 03/28/17 1312 03/29/17 0456 03/30/17 0319 03/31/17 0636  WBC 10.5 8.2 7.1 7.8 9.5  NEUTROABS 7.6*  --   --   --   --   HGB  --  12.1 11.7* 10.7* 11.8*  HCT 35.7 36.5 36.1 31.6* 35.8*  MCV 86 87.7 88.5 88.3 88.2  PLT 207 205 179 168 173   Cardiac Enzymes:  Recent Labs Lab 03/28/17 1603  CKTOTAL 71   BNP: BNP (last 3 results)  Recent Labs  04/01/16 1501 09/18/16 1545 03/28/17 1312  BNP 304.7* 283.0* 772.5*    ProBNP (last 3 results)  Recent Labs  02/11/17 1330 02/17/17 1616 03/27/17 1112  PROBNP 7,753* 4,735* 2,510*    CBG:  Recent Labs Lab 03/30/17 0736 03/30/17 1138 03/30/17 1635 03/30/17 2145 03/31/17 0731  GLUCAP 102* 167* 133* 134* 116*   SIGNED: Time coordinating discharge: 30 minutes  Faye Ramsay, MD  [facility] 03/31/2017, 9:54 AM Pager [phone]  If 7PM-7AM, please contact night-coverage [email or web address] Password [email or web address]

## 2017-04-02 ENCOUNTER — Ambulatory Visit (INDEPENDENT_AMBULATORY_CARE_PROVIDER_SITE_OTHER): Payer: Medicare Other | Admitting: Cardiology

## 2017-04-02 ENCOUNTER — Encounter: Payer: Self-pay | Admitting: Cardiology

## 2017-04-02 ENCOUNTER — Telehealth: Payer: Self-pay | Admitting: *Deleted

## 2017-04-02 VITALS — BP 120/60 | HR 65 | Ht 63.0 in | Wt 162.8 lb

## 2017-04-02 DIAGNOSIS — N183 Chronic kidney disease, stage 3 unspecified: Secondary | ICD-10-CM

## 2017-04-02 DIAGNOSIS — E860 Dehydration: Secondary | ICD-10-CM

## 2017-04-02 DIAGNOSIS — I952 Hypotension due to drugs: Secondary | ICD-10-CM | POA: Diagnosis not present

## 2017-04-02 DIAGNOSIS — I495 Sick sinus syndrome: Secondary | ICD-10-CM | POA: Diagnosis not present

## 2017-04-02 DIAGNOSIS — Z79899 Other long term (current) drug therapy: Secondary | ICD-10-CM | POA: Diagnosis not present

## 2017-04-02 DIAGNOSIS — N179 Acute kidney failure, unspecified: Secondary | ICD-10-CM

## 2017-04-02 DIAGNOSIS — N189 Chronic kidney disease, unspecified: Secondary | ICD-10-CM

## 2017-04-02 DIAGNOSIS — I480 Paroxysmal atrial fibrillation: Secondary | ICD-10-CM | POA: Diagnosis not present

## 2017-04-02 NOTE — Progress Notes (Signed)
Cardiology Office Note   Date:  04/02/2017   ID:  Jessica [name], DOB 12/23/1926, MRN [id number]  PCP:  Binnie Rail, MD  Cardiologist:  Dr. [name]    Chief Complaint  Patient presents with  . Congestive Heart Failure  . Hospitalization Follow-up      History of Present Illness: Jessica [name] is a 83 y.o. female who presents for post hospitalization for hypotension, AKI, and difficulty walking after a fall.   Hospitalized 03/28/17 to 03/31/17.  Also dx with UTI.  She was dehydrated and gently hydrated.  Lasix, spironolactone, lisinopril and hydralazine held.  Coreg was increase dprior to discharge for hypertension.  D/c;d on lasix 20 mg po BID.   Follow up today and again in 2-3 weeks.   At discharge she was in a fib, on Eliquis. Decreased dose with decreasing GFR.  No angina. No ASA due to Eliquis.  With her problems walking she is followed by PT.   A hx of CAD, with stents in 1856, chronic diastolic HF, DM, HTN, HLD and SSS with MDT PPM in 2011, PAF, and CKD  CAD,   Last echo 02/24/17 EF 30-35%, mild AR, mild to mod MR, TR mild to mod. PA pk pressure 46 mmHg. Last cath 2010 with stents.  Today she feels much better.  No chest pain and no SOB. Still with some difficulty ambulating but overall much better. Wt is stable and much lower than her dry wt of 170.  Does not eat salt.  Still taking ABX for UTI but almost done.     Past Medical History:  Diagnosis Date  . Breast cancer ([place])   . CAD (coronary artery disease)    a. s/p DESx2 to mid and distal RCA in 2010.  Marland Kitchen Cholelithiasis 09/12/2013   Lap Chole on 09/14/13   . Chronic diastolic CHF (congestive heart failure) ([place])    a. Echo 06/09/15: EF 55-60%, normal wall motion, mild AI, mild to moderate MR, mild LAE, mildly reduced RVSF, mild RAE, moderate TR, PASP 53 mmHg  . CKD (chronic kidney disease), stage III   . Diabetes mellitus    NON INSULIN DEPENDENT  . DJD (degenerative joint disease)   . GERD (gastroesophageal  reflux disease)   . Hemorrhoids   . History of diabetic neuropathy   . Hypercholesterolemia   . Hypertension   . Hypertensive heart disease   . Paroxysmal atrial fibrillation (HCC)    a. discovered on PPM interrogation 12/15, CHAD2VASC score is 6. b. decompensation with dCHF in 06/2015 possibly due to AF, s/p DCCV.  . SSS (sick sinus syndrome) ([place])    a. s/p Medtr[employer] PPM by JA for SSS and syncope 9/11.    Past Surgical History:  Procedure Laterality Date  . CARDIAC CATHETERIZATION  07/05/2009   EF 55-60%  . CARDIOVERSION N/A 06/11/2015   Procedure: CARDIOVERSION;  Surgeon: [name], MD;  Location: [facility] ENDOSCOPY;  Service: Cardiovascular;  Laterality: N/A;  . CHOLECYSTECTOMY N/A 09/14/2013   Procedure: LAPAROSCOPIC CHOLECYSTECTOMY WITH INTRAOPERATIVE CHOLANGIOGRAM;  Surgeon: [name], MD;  Location: [place];  Service: General;  Laterality: N/A;  . COLONOSCOPY    . ERCP N/A 09/13/2013   Procedure: ENDOSCOPIC RETROGRADE CHOLANGIOPANCREATOGRAPHY (ERCP);  Surgeon: [name], MD;  Location: [facility] ENDOSCOPY;  Service: Endoscopy;  Laterality: N/A;  . INSERT / REPLACE / REMOVE PACEMAKER  9/11   SSS and syncope, implanted by JA (MDT)  . MASTECTOMY  1992   BILATERAL WITH  RECONSTRUCTION     Current Outpatient Prescriptions  Medication Sig Dispense Refill  . apixaban (ELIQUIS) 2.5 MG TABS tablet Take 1 tablet (2.5 mg total) by mouth 2 (two) times daily. 60 tablet 3  . b complex vitamins tablet Take 1 tablet by mouth daily.    . blood glucose meter kit and supplies KIT Dispense based on patient and insurance preference. Test sugars once daily and prn as directed. (FOR ICD-9 250.00, 250.01). 1 each 0  . Calcium Citrate-Vitamin D (CALCIUM CITRATE + D PO) Take 1 tablet by mouth daily with breakfast.    . carvedilol (COREG) 12.5 MG tablet Take 1 tablet (12.5 mg total) by mouth 2 (two) times daily. 60 tablet 0  . ciprofloxacin (CIPRO) 500 MG tablet Take 1 tablet (500 mg total) by  mouth 2 (two) times daily. 8 tablet 0  . ECHINACEA PO Take 750 mg by mouth daily.    . fish oil-omega-3 fatty acids 1000 MG capsule Take 1 g by mouth every morning.     . furosemide (LASIX) 20 MG tablet Take 1 tablet (20 mg total) by mouth 2 (two) times daily. 60 tablet 0  . gabapentin (NEURONTIN) 100 MG capsule Take 1 capsule (100 mg total) by mouth at bedtime. Take in addition to the 300 mg cap you are already taking at night (Patient taking differently: Take 100 mg by mouth at bedtime. IN CONJUNCTION WITH ONE 300 MG CAPSULE TO EQUAL A TOTAL DOSE OF 400 MILLIGRAMS) 90 capsule 3  . gabapentin (NEURONTIN) 300 MG capsule TAKE ONE CAPSULE BY MOUTH TWICE DAILY 180 capsule 0  . metFORMIN (GLUCOPHAGE) 500 MG tablet TAKE ONE TABLET BY MOUTH ONCE DAILY WITH BREAKFAST (Patient taking differently: Take 500 mg by mouth daily with breakfast. ) 90 tablet 3  . Misc. Devices (ROLLATOR ULTRA-LIGHT) MISC With seat.  Diabetic neuropathy, knee arthritis 1 each 0  . nitroGLYCERIN (NITROSTAT) 0.4 MG SL tablet Place 1 tablet (0.4 mg total) under the tongue every 5 (five) minutes as needed for chest pain. x3 doses as needed for chest pain (Patient taking differently: Place 0.4 mg under the tongue every 5 (five) minutes as needed for chest pain. AND CALL 9-1-1 IF NO RELIEF) 25 tablet 0  . rosuvastatin (CRESTOR) 10 MG tablet TAKE ONE TABLET BY MOUTH ONCE DAILY IN THE MORNING 90 tablet 3   No current facility-administered medications for this visit.     Allergies:   Lyrica [pregabalin]; Amlodipine; and Sulfonamide derivatives    Social History:  The patient  reports that she has never smoked. She has never used smokeless tobacco. She reports that she does not drink alcohol or use drugs.   Family History:  The patient's family history includes Cancer in her father and mother.    ROS:  General:no colds or fevers, no weight changes Skin:no rashes or ulcers HEENT:no blurred vision, no congestion CV:see HPI PUL:see  HPI GI:no diarrhea constipation or melena, no indigestion GU:no hematuria, no dysuria MS:no joint pain, no claudication, + foot pain Neuro:no syncope, no lightheadedness Endo:no diabetes, no thyroid disease  Wt Readings from Last 3 Encounters:  04/02/17 162 lb 12.8 oz (73.8 kg)  03/31/17 158 lb 12.8 oz (72 kg)  03/27/17 158 lb (71.7 kg)     PHYSICAL EXAM: VS:  BP 120/60   Pulse 65   Ht 5' 3"  (1.6 m)   Wt 162 lb 12.8 oz (73.8 kg)   LMP  (LMP Unknown)   SpO2 97%   BMI 28.84  kg/m  , BMI Body mass index is 28.84 kg/m. General:Pleasant affect, NAD Skin:Warm and dry, brisk capillary refill HEENT:normocephalic, sclera clear, mucus membranes moist Neck:supple, no JVD, no bruits  Heart:irreg  With soft murmur, no gallup, rub or click Lungs:clear without rales, rhonchi, or wheezes VTX:LEZV, non tender, + BS, do not palpate liver spleen or masses Ext:no lower ext edema, 1+ pedal pulses, 2+ radial pulses Neuro:alert and oriented x 3, MAE, follows commands, + facial symmetry    EKG:  EKG is NOT ordered today.    Recent Labs: 02/11/2017: TSH 3.500 03/27/2017: ALT 13; NT-Pro BNP 2,510 03/28/2017: B Natriuretic Peptide 772.5; Magnesium 2.3 03/31/2017: BUN 31; Creatinine, Ser 1.35; Hemoglobin 11.8; Platelets 173; Potassium 4.7; Sodium 139    Lipid Panel    Component Value Date/Time   CHOL 101 06/09/2015 0330   TRIG 52 06/09/2015 0330   HDL 38 (L) 06/09/2015 0330   CHOLHDL 2.7 06/09/2015 0330   VLDL 10 06/09/2015 0330   LDLCALC 53 06/09/2015 0330   LDLDIRECT 152.0 10/10/2013 1619       Other studies Reviewed: Additional studies/ records that were reviewed today include: . ECHO 02/24/17  Study Conclusions  - Left ventricle: The cavity size was normal. Wall thickness was   normal. Systolic function was moderately to severely reduced. The   estimated ejection fraction was in the range of 30% to 35%. - Aortic valve: Mildly calcified annulus. Mildly thickened, mildly    calcified leaflets. There was mild regurgitation. - Mitral valve: There was mild to moderate regurgitation. - Right ventricle: Systolic function was mildly to moderately   reduced. - Tricuspid valve: There was mild-moderate regurgitation. - Pulmonary arteries: Systolic pressure was moderately increased.   PA peak pressure: 46 mm Hg (S).  ASSESSMENT AND PLAN:  1. Chronic diastolic HF now euvolemic lasix 20 mg daily, and if wt up to take extra 20 mg in afternoon  Follow up in 2 weeks and then with Dr. [name] July 19  2. Acute renal failure superimposed on stage 3 kidney disease.  Much improved will recheck BMP and CBC  3. DM-2 stable  4. HTH essential  5. SSS with PPM  6.  Recent hypotension now resolved but will not add meds yet.  7 ? Spider bite above umbilicus, to see derm tomorrow.   Current medicines are reviewed with the patient today.  The patient Has no concerns regarding medicines.  The following changes have been made:  See above Labs/ tests ordered today include:see above  Disposition:   FU:  see above  Signed, Cecilie Kicks, NP  04/02/2017 3:14 PM    [facility] [place] [place], [place] Phone: [phone]; Fax: [phone]

## 2017-04-02 NOTE — Telephone Encounter (Signed)
Called pt/daughter to set-up TCM hosp f/u appt  no answer LMOM RTC...Jessica [name]

## 2017-04-02 NOTE — Patient Instructions (Signed)
Medication Instructions:  Your physician recommends that you continue on your current medications as directed. Please refer to the Current Medication list given to you today.  Labwork: Please have labwork TODAY (CBC, BMET)  Follow-Up: Your physician recommends that you schedule a follow-up appointment in: 2 weeks with Cecilie Kicks NP and keep appt in July with Dr. [name].   Any Other Special Instructions Will Be Listed Below (If Applicable).     If you need a refill on your cardiac medications before your next appointment, please call your pharmacy.

## 2017-04-03 ENCOUNTER — Telehealth: Payer: Self-pay | Admitting: Cardiology

## 2017-04-03 DIAGNOSIS — Z79899 Other long term (current) drug therapy: Secondary | ICD-10-CM

## 2017-04-03 LAB — BASIC METABOLIC PANEL
BUN/Creatinine Ratio: 21 (ref 12–28)
BUN: 28 mg/dL — ABNORMAL HIGH (ref 8–27)
CALCIUM: 9.8 mg/dL (ref 8.7–10.3)
CHLORIDE: 103 mmol/L (ref 96–106)
CO2: 23 mmol/L (ref 18–29)
Creatinine, Ser: 1.31 mg/dL — ABNORMAL HIGH (ref 0.57–1.00)
GFR calc Af Amer: 42 mL/min/{1.73_m2} — ABNORMAL LOW (ref >59)
GFR, EST NON AFRICAN AMERICAN: 36 mL/min/{1.73_m2} — AB (ref >59)
Glucose: 94 mg/dL (ref 65–99)
POTASSIUM: 4.8 mmol/L (ref 3.5–5.2)
Sodium: 142 mmol/L (ref 134–144)

## 2017-04-03 LAB — CBC
HEMOGLOBIN: 12 g/dL (ref 11.1–15.9)
Hematocrit: 35.5 % (ref 34.0–46.6)
MCH: 30 pg (ref 26.6–33.0)
MCHC: 33.8 g/dL (ref 31.5–35.7)
MCV: 89 fL (ref 79–97)
Platelets: 249 10*3/uL (ref 150–379)
RBC: 4 x10E6/uL (ref 3.77–5.28)
RDW: 16.1 % — ABNORMAL HIGH (ref 12.3–15.4)
WBC: 7.8 10*3/uL (ref 3.4–10.8)

## 2017-04-03 NOTE — Telephone Encounter (Signed)
Returning a call about lab results . Please call

## 2017-04-03 NOTE — Telephone Encounter (Signed)
-----   Message from Isaiah Serge, NP sent at 04/03/2017  8:13 AM EDT ----- Kidney function sis stable a little improved from hospital.  Recheck in one week.

## 2017-04-06 NOTE — Telephone Encounter (Signed)
Called daughter [name]) to f/u on appt for hosp f/u.Per daughter called back last week to make appt. Schedulers had made appt for 04/14/17. Inform daughter just had some additional questions to ask concerning mom. Completed TCM call below.../lmb  Transition Care Management Follow-up Telephone Call   Date discharged? 03/31/17   How have you been since you were released from the hospital? Daughter states mom is alright   Do you understand why you were in the hospital? YES   Do you understand the discharge instructions? YES   Where were you discharged to? Home   Items Reviewed:  Medications reviewed: YES  Allergies reviewed: YES  Dietary changes reviewed: NO  Referrals reviewed: YES  Functional Questionnaire:   Activities of Daily Living (ADLs):   She states mom are independent in the following: bathing and hygiene, feeding, continence, grooming, toileting and dressing States she require assistance with the following: ambulation   Any transportation issues/concerns?: NO   Any patient concerns? NO   Confirmed importance and date/time of follow-up visits scheduled YES, appt 04/14/17  Provider Appointment booked with Dr. Quay Burow  Confirmed with patient if condition begins to worsen call PCP or go to the ER.  Patient was given the office number and encouraged to call back with question or concerns.  : YES

## 2017-04-09 ENCOUNTER — Other Ambulatory Visit: Payer: Medicare Other

## 2017-04-09 DIAGNOSIS — Z79899 Other long term (current) drug therapy: Secondary | ICD-10-CM

## 2017-04-09 LAB — BASIC METABOLIC PANEL
BUN/Creatinine Ratio: 22 (ref 12–28)
BUN: 24 mg/dL (ref 8–27)
CHLORIDE: 106 mmol/L (ref 96–106)
CO2: 24 mmol/L (ref 18–29)
Calcium: 10 mg/dL (ref 8.7–10.3)
Creatinine, Ser: 1.07 mg/dL — ABNORMAL HIGH (ref 0.57–1.00)
GFR, EST AFRICAN AMERICAN: 54 mL/min/{1.73_m2} — AB (ref >59)
GFR, EST NON AFRICAN AMERICAN: 46 mL/min/{1.73_m2} — AB (ref >59)
Glucose: 110 mg/dL — ABNORMAL HIGH (ref 65–99)
POTASSIUM: 4.4 mmol/L (ref 3.5–5.2)
SODIUM: 143 mmol/L (ref 134–144)

## 2017-04-14 ENCOUNTER — Encounter: Payer: Self-pay | Admitting: Internal Medicine

## 2017-04-14 ENCOUNTER — Ambulatory Visit (INDEPENDENT_AMBULATORY_CARE_PROVIDER_SITE_OTHER): Payer: Medicare Other | Admitting: Internal Medicine

## 2017-04-14 VITALS — BP 110/68 | HR 91 | Temp 98.1°F | Resp 16 | Wt 163.0 lb

## 2017-04-14 DIAGNOSIS — N3000 Acute cystitis without hematuria: Secondary | ICD-10-CM | POA: Diagnosis not present

## 2017-04-14 DIAGNOSIS — N183 Chronic kidney disease, stage 3 unspecified: Secondary | ICD-10-CM

## 2017-04-14 DIAGNOSIS — Z8639 Personal history of other endocrine, nutritional and metabolic disease: Secondary | ICD-10-CM | POA: Diagnosis not present

## 2017-04-14 DIAGNOSIS — I10 Essential (primary) hypertension: Secondary | ICD-10-CM | POA: Diagnosis not present

## 2017-04-14 DIAGNOSIS — E1140 Type 2 diabetes mellitus with diabetic neuropathy, unspecified: Secondary | ICD-10-CM | POA: Diagnosis not present

## 2017-04-14 DIAGNOSIS — N170 Acute kidney failure with tubular necrosis: Secondary | ICD-10-CM

## 2017-04-14 DIAGNOSIS — I5033 Acute on chronic diastolic (congestive) heart failure: Secondary | ICD-10-CM

## 2017-04-14 DIAGNOSIS — R600 Localized edema: Secondary | ICD-10-CM

## 2017-04-14 DIAGNOSIS — E860 Dehydration: Secondary | ICD-10-CM

## 2017-04-14 NOTE — Assessment & Plan Note (Signed)
Minimal edema on exam Continue Lasix 20 mg twice daily

## 2017-04-14 NOTE — Assessment & Plan Note (Signed)
Blood pressure has been well controlled on current medications No need to restart lisinopril and spironolactone at this time Has cardiology follow-up in 2 days We'll hold off on blood work until she sees them Continue current medications

## 2017-04-14 NOTE — Patient Instructions (Addendum)
  No immunizations administered today.   Medications reviewed and updated.  No changes recommended at this time.     Please followup in 6 month

## 2017-04-14 NOTE — Assessment & Plan Note (Signed)
Related to overdiuresis Resolved Currently fluid balanced Continue current medications

## 2017-04-14 NOTE — Assessment & Plan Note (Signed)
She has been continued on 20 mg of Lasix twice daily and appears euvolemic on exam. She is mild lower extremity edema, but this is minimal Has cardiology follow-up in 2 days Continue Lasix 20 mg twice daily No change in medications

## 2017-04-14 NOTE — Progress Notes (Signed)
Subjective:    Patient ID: Jessica [name], female    DOB: Jun 09, 1927, 83 y.o.   MRN: [id number]  HPI The patient is here for follow up.  Admitted 03/28/17 - 03/31/17.  Recommendations for Outpatient Follow-up:  1. Pt will need to follow up with PCP in 1-2 weeks post discharge 2. Please obtain BMP to evaluate electrolytes and kidney function, potassium level  3. Please also check CBC to evaluate Hg and Hct levels 4. Complete therapy for UTI with Cipro for 4 more days post duscharge  5. Please also note, pt advised to stop taking Lisinopril and Spironolactone until seen by primary provider 6. Please also note that dose of Lasix has been changed to 20 mg tablet twice daily per cardiology team recommendations  7. Coreg dose was increased to 12.5 mg twice daily, per cardiology team recommendations   She was taken to the emergency room for general weakness and fall. She had been following closely with cardiology regarding dehydration. She had been alternating between dehydration and fluid overload and has had several medication changes. She had fallen twice in the 5 days prior to being admitted. Her legs would just give out on her. There is no injuries or loss of consciousness with any of the falls. She had seen cardiology the day prior to admission and her creatinine was elevated and cardiology recommended admission. The patient had refused and was instructed to stop her diuretics temporarily and push oral fluids. On arrival to the emergency room initial evaluation revealed acute on chronic kidney disease, dehydration and urinary tract infection.    Acute on chronic diastolic CHF: She remained stable during her admission. Cardiology advised continuing Lasix 20 mg twice daily.  She is taking her medication as prescribed.  She denies shortness of breath and leg edema since being home. She denies generalized weakness, lightheadedness or dizziness.Overall she feels much better and is being and her  weakness continues to improve. She has an appointment with cardiology in 2 days. Her blood pressure has been well controlled.  Acute on chronic kidney disease, stage III: Her creatinine improved and remained stable prior to discharge. Spironolactone and lisinopril were temporarily held. It was advised they could be resumed once clinically indicated based on blood pressure control and stability of creatinine. She has had blood work done by cardiology since being released from the hospital. Her creatinine continued to improve. Her blood pressure is well controlled and denies leg edema. She has an appointment with cardiology in 2 days.   Diabetes, type II with neuropathy and nephropathy: She was continued on her home regimen.  Her sugars was 133 this morning.  Her sugars have never been over 150.  She denies any recent episodes of hypoglycemia. She is eating normally.  Urinary tract infection, Enterobacter: She was discharged home on Cipro. She has completed the antibiotic. She denies any dysuria, hematuria and increased urinary frequency. She denies any abdominal pain, nausea, fevers or chills.   Paroxysmal atrial fibrillation: She was continued on Coreg 12.5 mg twice daily. Sprionolactone and lisinopril were held. She was continued on eliquis. She denies any shortness of breath, chest pain or palpitations.    Medications and allergies reviewed with patient and updated if appropriate.  Patient Active Problem List   Diagnosis Date Noted  . UTI (urinary tract infection) 03/28/2017  . Acute renal failure superimposed on stage 3 chronic kidney disease ([place]) 03/28/2017  . Hypotension 03/28/2017  . Acute kidney injury superimposed on chronic kidney disease (  [place]) 03/28/2017  . Dehydration 03/27/2017  . Elevated serum creatinine 03/27/2017  . Diastolic dysfunction with acute on chronic heart failure ([place]) 05/15/2016  . Acute on chronic diastolic CHF (congestive heart failure), NYHA class 2 ([place])  05/15/2016  . Bilateral leg edema 04/29/2016  . AF (paroxysmal atrial fibrillation) ([place]) 06/11/2015  . CKD (chronic kidney disease) stage 3, GFR 30-59 ml/min 06/10/2015  . CAD (coronary artery disease) 06/09/2015  . Diabetic neuropathy ([place]) 07/06/2013  . SSS (sick sinus syndrome) ([place]) 02/10/2013  . Hypertension 07/30/2012  . Pacemaker-Medtronic 07/19/2012  . Benign hypertensive heart disease with heart failure ([place]) 01/28/2012  . Diabetes ([place]) 10/22/2010  . HYPERCHOLESTEROLEMIA 10/22/2010  . Angina pectoris ([place]) 10/22/2010  . DEGENERATIVE JOINT DISEASE 10/22/2010    Current Outpatient Prescriptions on File Prior to Visit  Medication Sig Dispense Refill  . apixaban (ELIQUIS) 2.5 MG TABS tablet Take 1 tablet (2.5 mg total) by mouth 2 (two) times daily. 60 tablet 3  . b complex vitamins tablet Take 1 tablet by mouth daily.    . blood glucose meter kit and supplies KIT Dispense based on patient and insurance preference. Test sugars once daily and prn as directed. (FOR ICD-9 250.00, 250.01). 1 each 0  . Calcium Citrate-Vitamin D (CALCIUM CITRATE + D PO) Take 1 tablet by mouth daily with breakfast.    . carvedilol (COREG) 12.5 MG tablet Take 1 tablet (12.5 mg total) by mouth 2 (two) times daily. 60 tablet 0  . ECHINACEA PO Take 750 mg by mouth daily.    . fish oil-omega-3 fatty acids 1000 MG capsule Take 1 g by mouth every morning.     . furosemide (LASIX) 20 MG tablet Take 1 tablet (20 mg total) by mouth 2 (two) times daily. (Patient taking differently: Take 20 mg by mouth daily. Takes 1 extra tablet in afternoon if legs are swelling) 60 tablet 0  . gabapentin (NEURONTIN) 100 MG capsule Take 1 capsule (100 mg total) by mouth at bedtime. Take in addition to the 300 mg cap you are already taking at night (Patient taking differently: Take 100 mg by mouth at bedtime. IN CONJUNCTION WITH ONE 300 MG CAPSULE TO EQUAL A TOTAL DOSE OF 400 MILLIGRAMS) 90 capsule 3  . gabapentin (NEURONTIN) 300 MG  capsule TAKE ONE CAPSULE BY MOUTH TWICE DAILY 180 capsule 0  . metFORMIN (GLUCOPHAGE) 500 MG tablet TAKE ONE TABLET BY MOUTH ONCE DAILY WITH BREAKFAST (Patient taking differently: Take 500 mg by mouth daily with breakfast. ) 90 tablet 3  . Misc. Devices (ROLLATOR ULTRA-LIGHT) MISC With seat.  Diabetic neuropathy, knee arthritis 1 each 0  . nitroGLYCERIN (NITROSTAT) 0.4 MG SL tablet Place 1 tablet (0.4 mg total) under the tongue every 5 (five) minutes as needed for chest pain. x3 doses as needed for chest pain (Patient taking differently: Place 0.4 mg under the tongue every 5 (five) minutes as needed for chest pain. AND CALL 9-1-1 IF NO RELIEF) 25 tablet 0  . rosuvastatin (CRESTOR) 10 MG tablet TAKE ONE TABLET BY MOUTH ONCE DAILY IN THE MORNING 90 tablet 3   No current facility-administered medications on file prior to visit.     Past Medical History:  Diagnosis Date  . Breast cancer ([place])   . CAD (coronary artery disease)    a. s/p DESx2 to mid and distal RCA in 2010.  Marland Kitchen Cholelithiasis 09/12/2013   Lap Chole on 09/14/13   . Chronic diastolic CHF (congestive heart failure) ([place])    a.  Echo 06/09/15: EF 55-60%, normal wall motion, mild AI, mild to moderate MR, mild LAE, mildly reduced RVSF, mild RAE, moderate TR, PASP 53 mmHg  . CKD (chronic kidney disease), stage III   . Diabetes mellitus    NON INSULIN DEPENDENT  . DJD (degenerative joint disease)   . GERD (gastroesophageal reflux disease)   . Hemorrhoids   . History of diabetic neuropathy   . Hypercholesterolemia   . Hypertension   . Hypertensive heart disease   . Paroxysmal atrial fibrillation (H[facility])    a. discovered on PPM interrogation 12/15, CHAD2VASC score is 6. b. decompensation with dCHF in 06/2015 possibly due to AF, s/p DCCV.  . SSS (sick sinus syndrome) ([place])    a. s/p Medtronic PPM by JA for SSS and syncope 9/11.    Past Surgical History:  Procedure Laterality Date  . CARDIAC CATHETERIZATION  07/05/2009   EF 55-60%  .  CARDIOVERSION N/A 06/11/2015   Procedure: CARDIOVERSION;  Surgeon: [name], MD;  Location: [facility] ENDOSCOPY;  Service: Cardiovascular;  Laterality: N/A;  . CHOLECYSTECTOMY N/A 09/14/2013   Procedure: LAPAROSCOPIC CHOLECYSTECTOMY WITH INTRAOPERATIVE CHOLANGIOGRAM;  Surgeon: [name], MD;  Location: [place];  Service: General;  Laterality: N/A;  . COLONOSCOPY    . ERCP N/A 09/13/2013   Procedure: ENDOSCOPIC RETROGRADE CHOLANGIOPANCREATOGRAPHY (ERCP);  Surgeon: [name], MD;  Location: [facility] ENDOSCOPY;  Service: Endoscopy;  Laterality: N/A;  . INSERT / REPLACE / REMOVE PACEMAKER  9/11   SSS and syncope, implanted by JA (MDT)  . MASTECTOMY  1992   BILATERAL WITH RECONSTRUCTION    Social History   Social History  . Marital status: Widowed    Spouse name: N/A  . Number of children: N/A  . Years of education: N/A   Social History Main Topics  . Smoking status: Never Smoker  . Smokeless tobacco: Never Used  . Alcohol use No  . Drug use: No  . Sexual activity: Not Asked   Other Topics Concern  . None   Social History Narrative  . None    Family History  Problem Relation Age of Onset  . Cancer Mother   . Cancer Father     Review of Systems  Constitutional: Negative for appetite change, chills, fatigue and fever.  Respiratory: Positive for cough (dry, sometimes, tickle in throat). Negative for shortness of breath and wheezing.   Cardiovascular: Negative for chest pain, palpitations and leg swelling.  Gastrointestinal: Positive for constipation. Negative for abdominal pain and diarrhea.  Genitourinary: Negative for dysuria, frequency and hematuria.  Neurological: Negative for dizziness, light-headedness and headaches.       Objective:   Vitals:   04/14/17 1018  BP: 110/68  Pulse: 91  Resp: 16  Temp: 98.1 F (36.7 C)   Wt Readings from Last 3 Encounters:  04/14/17 163 lb (73.9 kg)  04/02/17 162 lb 12.8 oz (73.8 kg)  03/31/17 158 lb 12.8 oz (72 kg)    Body mass index is 28.87 kg/m.   Physical Exam    Constitutional: Appears well-developed and well-nourished. No distress.  HENT:  Head: Normocephalic and atraumatic.  Neck: Neck supple. No tracheal deviation present. No thyromegaly present.  No cervical lymphadenopathy Cardiovascular: Normal rate, regular rhythm and normal heart sounds.   1/6 systolic murmur heard. No carotid bruit .  Trace RLE edema and mild LLE edema Pulmonary/Chest: Effort normal and breath sounds normal. No respiratory distress. No has no wheezes. No rales.  Abdomen: Soft, nontender, nondistended  Skin:  Skin is warm and dry. Not diaphoretic.  Psychiatric: Normal mood and affect. Behavior is normal.   Lab Results  Component Value Date   CREATININE 1.07 (H) 04/09/2017   CREATININE 1.31 (H) 04/02/2017   CREATININE 1.35 (H) 03/31/2017    CBC Latest Ref Rng & Units 04/02/2017 03/31/2017 03/30/2017  WBC 3.4 - 10.8 x10E3/uL 7.8 9.5 7.8  Hemoglobin 11.1 - 15.9 g/dL 12.0 11.8(L) 10.7(L)  Hematocrit 34.0 - 46.6 % 35.5 35.8(L) 31.6(L)  Platelets 150 - 379 x10E3/uL 249 173 168   Lab Results  Component Value Date   HGBA1C 6.6 (H) 03/29/2017       Assessment & Plan:    See Problem List for Assessment and Plan of chronic medical problems.

## 2017-04-14 NOTE — Assessment & Plan Note (Signed)
Completed antibiotic No current urinary symptoms so we will not recheck urinalysis/culture

## 2017-04-14 NOTE — Assessment & Plan Note (Signed)
Recent A1c 6.6% No recent hypoglycemia, sugars are always less than 150 at home overall well controlled Continue metformin 500 mg daily

## 2017-04-14 NOTE — Progress Notes (Signed)
Cardiology Office Note   Date:  04/16/2017   ID:  Jessica [name], DOB [birth year], MRN [id number]  PCP:  Binnie Rail, MD  Cardiologist:  Dr. [name]    Chief Complaint  Patient presents with  . Hypotension      History of Present Illness: Jessica [name] is a 83 y.o. female who presents for hypotension, AKI, and recent hospitalization.  She has a fib on Eliquis.  Also hx of CAD, with stents in 3235, chronic diastolic HF, DM, HLD, SSS with MDT PPM in 2011. Last visit 5//31/18 she was stable, and stable wt.  She is on lasix 20 mg daily.   CBC was normal and BMP with improved Cr and a week later Cr down to 1.07.   A hx of CAD, with stents in 5732, chronic diastolic HF, DM, HTN, HLD and SSS with MDT PPM in 2011, PAF, and CKD CAD,   Last echo 02/24/17 EF 30-35%, mild AR, mild to mod MR, TR mild to mod. PA pk pressure 46 mmHg. Last cath 2010 with stents.   Today her wt is stable.  No chest pain or SOB.  No lightheadedness or dizziness. She has PT 3 X week and HH RN twice a week but they may stop next week,  Her daughter does an excellent job of monitoring her.  No salt added to diet  No bleeding.  Her umbilical ulcer was biopsied and felt to be a cyst.  Still present and she will follow with derm in 8 weeks.   We reviewed her labs and improving Cr. She saw PCP yesterday.  Continues on eliquis.    Past Medical History:  Diagnosis Date  . Breast cancer ([place])   . CAD (coronary artery disease)    a. s/p DESx2 to mid and distal RCA in 2010.  Marland Kitchen Cholelithiasis 09/12/2013   Lap Chole on 09/14/13   . Chronic diastolic CHF (congestive heart failure) ([place])    a. Echo 06/09/15: EF 55-60%, normal wall motion, mild AI, mild to moderate MR, mild LAE, mildly reduced RVSF, mild RAE, moderate TR, PASP 53 mmHg  . CKD (chronic kidney disease), stage III   . Diabetes mellitus    NON INSULIN DEPENDENT  . DJD (degenerative joint disease)   . GERD (gastroesophageal reflux disease)   . Hemorrhoids   .  History of diabetic neuropathy   . Hypercholesterolemia   . Hypertension   . Hypertensive heart disease   . Paroxysmal atrial fibrillation (H[facility])    a. discovered on PPM interrogation 12/15, CHAD2VASC score is 6. b. decompensation with dCHF in 06/2015 possibly due to AF, s/p DCCV.  . SSS (sick sinus syndrome) ([place])    a. s/p Medtronic PPM by JA for SSS and syncope 9/11.    Past Surgical History:  Procedure Laterality Date  . CARDIAC CATHETERIZATION  07/05/2009   EF 55-60%  . CARDIOVERSION N/A 06/11/2015   Procedure: CARDIOVERSION;  Surgeon: [name], MD;  Location: [facility] ENDOSCOPY;  Service: Cardiovascular;  Laterality: N/A;  . CHOLECYSTECTOMY N/A 09/14/2013   Procedure: LAPAROSCOPIC CHOLECYSTECTOMY WITH INTRAOPERATIVE CHOLANGIOGRAM;  Surgeon: [name], MD;  Location: [place];  Service: General;  Laterality: N/A;  . COLONOSCOPY    . ERCP N/A 09/13/2013   Procedure: ENDOSCOPIC RETROGRADE CHOLANGIOPANCREATOGRAPHY (ERCP);  Surgeon: [name], MD;  Location[facility] ENDOSCOPY;  Service: Endoscopy;  Laterality: N/A;  . INSERT / REPLACE / REMOVE PACEMAKER  9/11   SSS and syncope, implanted by JA (  MDT)  . MASTECTOMY  1992   BILATERAL WITH RECONSTRUCTION     Current Outpatient Prescriptions  Medication Sig Dispense Refill  . apixaban (ELIQUIS) 2.5 MG TABS tablet Take 1 tablet (2.5 mg total) by mouth 2 (two) times daily. 60 tablet 3  . b complex vitamins tablet Take 1 tablet by mouth daily.    . blood glucose meter kit and supplies KIT Dispense based on patient and insurance preference. Test sugars once daily and prn as directed. (FOR ICD-9 250.00, 250.01). 1 each 0  . Calcium Citrate-Vitamin D (CALCIUM CITRATE + D PO) Take 1 tablet by mouth daily with breakfast.    . carvedilol (COREG) 12.5 MG tablet Take 1 tablet (12.5 mg total) by mouth 2 (two) times daily. 60 tablet 0  . ECHINACEA PO Take 750 mg by mouth daily.    . fish oil-omega-3 fatty acids 1000 MG capsule Take 1 g by mouth  every morning.     . furosemide (LASIX) 20 MG tablet Take 1 tablet (20 mg total) by mouth 2 (two) times daily. (Patient taking differently: Take 20 mg by mouth daily. Takes 1 extra tablet in afternoon if legs are swelling) 60 tablet 0  . gabapentin (NEURONTIN) 100 MG capsule Take 1 capsule (100 mg total) by mouth at bedtime. Take in addition to the 300 mg cap you are already taking at night (Patient taking differently: Take 100 mg by mouth at bedtime. IN CONJUNCTION WITH ONE 300 MG CAPSULE TO EQUAL A TOTAL DOSE OF 400 MILLIGRAMS) 90 capsule 3  . gabapentin (NEURONTIN) 300 MG capsule TAKE ONE CAPSULE BY MOUTH TWICE DAILY 180 capsule 0  . metFORMIN (GLUCOPHAGE) 500 MG tablet TAKE ONE TABLET BY MOUTH ONCE DAILY WITH BREAKFAST (Patient taking differently: Take 500 mg by mouth daily with breakfast. ) 90 tablet 3  . Misc. Devices (ROLLATOR ULTRA-LIGHT) MISC With seat.  Diabetic neuropathy, knee arthritis 1 each 0  . nitroGLYCERIN (NITROSTAT) 0.4 MG SL tablet Place 1 tablet (0.4 mg total) under the tongue every 5 (five) minutes as needed for chest pain. x3 doses as needed for chest pain (Patient taking differently: Place 0.4 mg under the tongue every 5 (five) minutes as needed for chest pain. AND CALL 9-1-1 IF NO RELIEF) 25 tablet 0  . rosuvastatin (CRESTOR) 10 MG tablet TAKE ONE TABLET BY MOUTH ONCE DAILY IN THE MORNING 90 tablet 3   No current facility-administered medications for this visit.     Allergies:   Lyrica [pregabalin]; Amlodipine; and Sulfonamide derivatives    Social History:  The patient  reports that she has never smoked. She has never used smokeless tobacco. She reports that she does not drink alcohol or use drugs.   Family History:  The patient's family history includes Cancer in her father and mother.    ROS:  General:no colds or fevers, no weight changes Skin:no rashes + ulcer/cyst are above umbilicus   HEENT:no blurred vision, no congestion CV:see HPI PUL:see HPI GI:no diarrhea  constipation or melena, no indigestion GU:no hematuria, no dysuria MS:no joint pain, no claudication Neuro:no syncope, no lightheadedness Endo:+ diabetes, no thyroid disease  Wt Readings from Last 3 Encounters:  04/16/17 162 lb 12.8 oz (73.8 kg)  04/14/17 163 lb (73.9 kg)  04/02/17 162 lb 12.8 oz (73.8 kg)     PHYSICAL EXAM: VS:  BP 116/70 (BP Location: Left Arm, Patient Position: Sitting, Cuff Size: Large)   Pulse 84   Ht 5' 3"  (1.6 m)   Wt 162  lb 12.8 oz (73.8 kg)   LMP  (LMP Unknown)   SpO2 97%   BMI 28.84 kg/m  , BMI Body mass index is 28.84 kg/m. General:Pleasant affect, NAD Skin:Warm and dry, brisk capillary refill HEENT:normocephalic, sclera clear, mucus membranes moist Neck:supple, no JVD, no bruits  Heart: irreg with soft systolic murmur, no gallup, rub or click Lungs:clear without rales, rhonchi, or wheezes UVO:ZDGU, non tender, + BS, do not palpate liver spleen or masses Ext:no lower ext edema on rt slight edema on Lt. , 2+ pedal pulses, 2+ radial pulses Neuro:alert and oriented X 3, MAE, follows commands, + facial symmetry    EKG:  EKG is NOT ordered today.    Recent Labs: 02/11/2017: TSH 3.500 03/27/2017: ALT 13; NT-Pro BNP 2,510 03/28/2017: B Natriuretic Peptide 772.5; Magnesium 2.3 04/02/2017: Hemoglobin 12.0; Platelets 249 04/09/2017: BUN 24; Creatinine, Ser 1.07; Potassium 4.4; Sodium 143    Lipid Panel    Component Value Date/Time   CHOL 101 06/09/2015 0330   TRIG 52 06/09/2015 0330   HDL 38 (L) 06/09/2015 0330   CHOLHDL 2.7 06/09/2015 0330   VLDL 10 06/09/2015 0330   LDLCALC 53 06/09/2015 0330   LDLDIRECT 152.0 10/10/2013 1619       Other studies Reviewed: Additional studies/ records that were reviewed today include: . Echo 02/2017 Study Conclusions  - Left ventricle: The cavity size was normal. Wall thickness was   normal. Systolic function was moderately to severely reduced. The   estimated ejection fraction was in the range of 30% to  35%. - Aortic valve: Mildly calcified annulus. Mildly thickened, mildly   calcified leaflets. There was mild regurgitation. - Mitral valve: There was mild to moderate regurgitation. - Right ventricle: Systolic function was mildly to moderately   reduced. - Tricuspid valve: There was mild-moderate regurgitation. - Pulmonary arteries: Systolic pressure was moderately increased.   PA peak pressure: 46 mm Hg (S).  ASSESSMENT AND PLAN:  1.  Chronic diastolic and systolic HF -euvolemic on lasix 20 in AM and 20 in PM if edema or wt increase.  She has not needed the pm dose of lasix in some time.  No SOB. Monitors salt in her diet.  Will continue to hold lisinopril and spironolactone and hydralazine for now but when seen by Dr. [name] she may consider adding back.   2.  AKI on stage 3 CKD , much improved.  This was due to dehydration due to acute HF.  Off lisinopril.   3.   HTN, stable on coreg- recent hypotension and now well controlled to soft.    4. A fib, on Eliquis at lower dose due to AKI, if stable on next visit may need increase.    5. SSS with PPM  6.  Cyst above umbilicus.   7. DM-2 followed by PCP and stable.    Current medicines are reviewed with the patient today.  The patient Has no concerns regarding medicines.  The following changes have been made:  See above Labs/ tests ordered today include:see above  Disposition:   FU:  see above  Signed, Cecilie Kicks, NP  04/16/2017 9:11 AM    [address], [place], [place], [place] Phone: [phone]; Fax: [phone]

## 2017-04-14 NOTE — Assessment & Plan Note (Signed)
Related to overdiuresis and dehydration Improved while in hospital and has had blood work since discharge with continued improvement in her kidney function No change in medications Appears to be fluid balanced We'll hold off on blood work and she is seeing cardiology in 2 days and then may want to check it

## 2017-04-16 ENCOUNTER — Encounter: Payer: Self-pay | Admitting: Cardiology

## 2017-04-16 ENCOUNTER — Ambulatory Visit (INDEPENDENT_AMBULATORY_CARE_PROVIDER_SITE_OTHER): Payer: Medicare Other | Admitting: Cardiology

## 2017-04-16 VITALS — BP 116/70 | HR 84 | Ht 63.0 in | Wt 162.8 lb

## 2017-04-16 DIAGNOSIS — N183 Chronic kidney disease, stage 3 unspecified: Secondary | ICD-10-CM

## 2017-04-16 DIAGNOSIS — I952 Hypotension due to drugs: Secondary | ICD-10-CM | POA: Diagnosis not present

## 2017-04-16 DIAGNOSIS — I495 Sick sinus syndrome: Secondary | ICD-10-CM | POA: Diagnosis not present

## 2017-04-16 DIAGNOSIS — N179 Acute kidney failure, unspecified: Secondary | ICD-10-CM

## 2017-04-16 DIAGNOSIS — Z79899 Other long term (current) drug therapy: Secondary | ICD-10-CM | POA: Diagnosis not present

## 2017-04-16 DIAGNOSIS — N189 Chronic kidney disease, unspecified: Secondary | ICD-10-CM | POA: Diagnosis not present

## 2017-04-16 DIAGNOSIS — I5042 Chronic combined systolic (congestive) and diastolic (congestive) heart failure: Secondary | ICD-10-CM | POA: Diagnosis not present

## 2017-04-16 DIAGNOSIS — I480 Paroxysmal atrial fibrillation: Secondary | ICD-10-CM | POA: Diagnosis not present

## 2017-04-16 NOTE — Patient Instructions (Signed)
Medication Instructions:   Your physician recommends that you continue on your current medications as directed. Please refer to the Current Medication list given to you today.   If you need a refill on your cardiac medications before your next appointment, please call your pharmacy.  Labwork: NONE ORDERED  TODAY    Testing/Procedures: . NONE ORDERED  TODAY   Follow-Up:  WITH DR  [name] AS SCHEDULED   Any Other Special Instructions Will Be Listed Below (If Applicable).

## 2017-04-21 ENCOUNTER — Ambulatory Visit: Payer: Medicare Other | Admitting: Internal Medicine

## 2017-04-27 ENCOUNTER — Other Ambulatory Visit: Payer: Self-pay | Admitting: Internal Medicine

## 2017-05-05 ENCOUNTER — Other Ambulatory Visit: Payer: Self-pay | Admitting: Internal Medicine

## 2017-05-08 ENCOUNTER — Ambulatory Visit: Payer: Medicare Other | Admitting: Podiatry

## 2017-05-15 ENCOUNTER — Encounter: Payer: Self-pay | Admitting: Podiatry

## 2017-05-15 ENCOUNTER — Ambulatory Visit (INDEPENDENT_AMBULATORY_CARE_PROVIDER_SITE_OTHER): Payer: Medicare Other | Admitting: Podiatry

## 2017-05-15 DIAGNOSIS — M79676 Pain in unspecified toe(s): Secondary | ICD-10-CM | POA: Diagnosis not present

## 2017-05-15 DIAGNOSIS — E1140 Type 2 diabetes mellitus with diabetic neuropathy, unspecified: Secondary | ICD-10-CM | POA: Diagnosis not present

## 2017-05-15 DIAGNOSIS — Q828 Other specified congenital malformations of skin: Secondary | ICD-10-CM

## 2017-05-15 DIAGNOSIS — B351 Tinea unguium: Secondary | ICD-10-CM

## 2017-05-15 NOTE — Progress Notes (Signed)
Subjective: 83 y.o. returns the office today for painful, elongated, thickened toenails which she cannot trim herself. Denies any redness or drainage around the nails. She also has a callus to the bottom of her left foot. Denies any acute changes since last appointment and no new complaints today. Denies any systemic complaints such as fevers, chills, nausea, vomiting.   Objective: AAO 3, NAD DP/PT pulses palpable, CRT less than 3 seconds  Nails hypertrophic, dystrophic, elongated, brittle, discolored 10. There is tenderness overlying the nails 1-5 bilaterally. There is no surrounding erythema or drainage along the nail sites. Hyperkeratotic lesion submetatarsal foot. Upon debridement there is no underlying ulceration, drainage or any signs of infection. There was macerated tissue from the corn remover pad. There is no signs of infection. No open lesions or pre-ulcerative lesions are identified. No other areas of tenderness bilateral lower extremities. No overlying edema, erythema, increased warmth. No pain with calf compression, swelling, warmth, erythema.  Assessment: Patient presents with symptomatic onychomycosis; porokeratosis  Plan: -Treatment options including alternatives, risks, complications were discussed -Nails sharply debrided 10 without complication/bleeding. -Hyperkeratotic lesion was debrided 1 without complications or bleeding.  -Discussed daily foot inspection. If there are any changes, to call the office immediately.  -Follow-up in 3 months or sooner if any problems are to arise. In the meantime, encouraged to call the office with any questions, concerns, changes symptoms.  Celesta Gentile, DPM

## 2017-05-19 ENCOUNTER — Other Ambulatory Visit: Payer: Self-pay | Admitting: Physician Assistant

## 2017-05-19 NOTE — Telephone Encounter (Signed)
Pt last saw Dr [name] on 03/27/17, last labs on 04/09/17 Creat 1.07, age 83, weight 73.8kg, based on specified criteria pt is not on appropriate dosage of Eliquis.  Pt is taking Eliquis 2.5mg  BID, but should be taking Elqiuis 5mg  BID.    Will forward message to Dr [name] to see if she wants [place] to change pt's current Eliquis dosage to 5mg  BID or if she wants pt to continue on Elqiuis 2.5mg  BID.  Pt has hx of kidney dysfunction elevated Creat in the past, but is 1.07 on 04/09/17. Please advise what dosage of Eliquis pt should be taking.  Thanks

## 2017-05-21 ENCOUNTER — Encounter: Payer: Self-pay | Admitting: Cardiology

## 2017-05-21 ENCOUNTER — Ambulatory Visit (INDEPENDENT_AMBULATORY_CARE_PROVIDER_SITE_OTHER): Payer: Medicare Other | Admitting: Cardiology

## 2017-05-21 VITALS — BP 144/76 | HR 68 | Ht 63.0 in | Wt 163.0 lb

## 2017-05-21 DIAGNOSIS — N183 Chronic kidney disease, stage 3 unspecified: Secondary | ICD-10-CM

## 2017-05-21 DIAGNOSIS — I480 Paroxysmal atrial fibrillation: Secondary | ICD-10-CM | POA: Diagnosis not present

## 2017-05-21 DIAGNOSIS — I495 Sick sinus syndrome: Secondary | ICD-10-CM | POA: Diagnosis not present

## 2017-05-21 DIAGNOSIS — I5033 Acute on chronic diastolic (congestive) heart failure: Secondary | ICD-10-CM | POA: Diagnosis not present

## 2017-05-21 DIAGNOSIS — I5042 Chronic combined systolic (congestive) and diastolic (congestive) heart failure: Secondary | ICD-10-CM | POA: Diagnosis not present

## 2017-05-21 MED ORDER — APIXABAN 5 MG PO TABS: 5.0000 mg | ORAL_TABLET | Freq: Two times a day (BID) | ORAL | 2 refills | Status: DC

## 2017-05-21 NOTE — Patient Instructions (Signed)
Medication Instructions:   INCREASE YOUR ELIQUIS TO 5 MG TWICE DAILY    Labwork:  TODAY--BMET AND PRO-BNP     Follow-Up:  2 MONTHS WITH LAURA INGOLD NP, PER DR NELSON       If you need a refill on your cardiac medications before your next appointment, please call your pharmacy.

## 2017-05-21 NOTE — Telephone Encounter (Signed)
-----   Message from [name], MD sent at 05/20/2017 10:25 AM EDT ----- I agree with Eliquis 5 mg po BID.  ----- Message ----- From: Brynda Peon, RN Sent: 05/19/2017   4:13 PM To: [name], MD  Pt last saw Dr [name] on 03/27/17, last labs on 04/09/17 Creat 1.07, age 83, weight 73.8kg, based on specified criteria pt is not on appropriate dosage of Eliquis.  Pt is taking Eliquis 2.5mg  BID, but should be taking Elqiuis 5mg  BID.    Will forward message to Dr [name] to see if she wants [place] to change pt's current Eliquis dosage to 5mg  BID or if she wants pt to continue on Elqiuis 2.5mg  BID.  Pt has hx of kidney dysfunction elevated Creat in the past, but is 1.07 on 04/09/17. Please advise what dosage of Eliquis pt should be taking.  Thanks

## 2017-05-21 NOTE — Progress Notes (Signed)
Cardiology Office Note   Date:  05/23/2017   ID:  Jessica [name], DOB [birth year], MRN [id number]  PCP:  Binnie Rail, MD  Cardiologist:  Dr. [name]    Chief complain: 1 month follow up   History of Present Illness: Jessica [name] is a 83 y.o. female who presents for hypotension, AKI, and recent hospitalization.  She has a fib on Eliquis.  Also hx of CAD, with stents in 9169, chronic diastolic HF, DM, HLD, SSS with MDT PPM in 2011. Last visit 5//31/18 she was stable, and stable wt.  She is on lasix 20 mg daily.   CBC was normal and BMP with improved Cr and a week later Cr down to 1.07.   A hx of CAD, with stents in 4503, chronic diastolic HF, DM, HTN, HLD and SSS with MDT PPM in 2011, PAF, and CKD CAD,  Last echo 02/24/17 EF 30-35%, mild AR, mild to mod MR, TR mild to mod. PA pk pressure 46 mmHg. Last cath 2010 with stents.  05/21/17 - th epatient states that she has been feeling well, she has had occassional PND, mild LE edema, but overall feels well. No muscle cramping, no palpitations, no syncope.  Today her wt is stable.  No chest pain or SOB.  Continues on eliquis. No bleeding.   Past Medical History:  Diagnosis Date  . Breast cancer ([place])   . CAD (coronary artery disease)    a. s/p DESx2 to mid and distal RCA in 2010.  Marland Kitchen Cholelithiasis 09/12/2013   Lap Chole on 09/14/13   . Chronic diastolic CHF (congestive heart failure) ([place])    a. Echo 06/09/15: EF 55-60%, normal wall motion, mild AI, mild to moderate MR, mild LAE, mildly reduced RVSF, mild RAE, moderate TR, PASP 53 mmHg  . CKD (chronic kidney disease), stage III   . Diabetes mellitus    NON INSULIN DEPENDENT  . DJD (degenerative joint disease)   . GERD (gastroesophageal reflux disease)   . Hemorrhoids   . History of diabetic neuropathy   . Hypercholesterolemia   . Hypertension   . Hypertensive heart disease   . Paroxysmal atrial fibrillation (HCC)    a. discovered on PPM interrogation 12/15, CHAD2VASC score is 6.  b. decompensation with dCHF in 06/2015 possibly due to AF, s/p DCCV.  . SSS (sick sinus syndrome) ([place])    a. s/p Medtronic PPM by JA for SSS and syncope 9/11.    Past Surgical History:  Procedure Laterality Date  . CARDIAC CATHETERIZATION  07/05/2009   EF 55-60%  . CARDIOVERSION N/A 06/11/2015   Procedure: CARDIOVERSION;  Surgeon: [name], MD;  Location: [facility] ENDOSCOPY;  Service: Cardiovascular;  Laterality: N/A;  . CHOLECYSTECTOMY N/A 09/14/2013   Procedure: LAPAROSCOPIC CHOLECYSTECTOMY WITH INTRAOPERATIVE CHOLANGIOGRAM;  Surgeon: [name], MD;  Location: [place];  Service: General;  Laterality: N/A;  . COLONOSCOPY    . ERCP N/A 09/13/2013   Procedure: ENDOSCOPIC RETROGRADE CHOLANGIOPANCREATOGRAPHY (ERCP);  Surgeon: [name], MD;  Location: [facility] ENDOSCOPY;  Service: Endoscopy;  Laterality: N/A;  . INSERT / REPLACE / REMOVE PACEMAKER  9/11   SSS and syncope, implanted by JA (MDT)  . MASTECTOMY  1992   BILATERAL WITH RECONSTRUCTION     Current Outpatient Prescriptions  Medication Sig Dispense Refill  . b complex vitamins tablet Take 1 tablet by mouth daily.    . blood glucose meter kit and supplies KIT Dispense based on patient and insurance preference. Test sugars  once daily and prn as directed. (FOR ICD-9 250.00, 250.01). 1 each 0  . Calcium Citrate-Vitamin D (CALCIUM CITRATE + D PO) Take 1 tablet by mouth daily with breakfast.    . carvedilol (COREG) 12.5 MG tablet Take 1 tablet (12.5 mg total) by mouth 2 (two) times daily. 60 tablet 0  . ECHINACEA PO Take 750 mg by mouth daily.    . fish oil-omega-3 fatty acids 1000 MG capsule Take 1 g by mouth every morning.     . gabapentin (NEURONTIN) 100 MG capsule TAKE ONE CAPSULE BY MOUTH AT BEDTIME. TAKE ON ADDITION TO THE 300 MG CAP YOU ARE ALREADY TAKING AT NIGHT 90 capsule 3  . gabapentin (NEURONTIN) 300 MG capsule TAKE 1 CAPSULE BY MOUTH TWICE DAILY 180 capsule 1  . metFORMIN (GLUCOPHAGE) 500 MG tablet TAKE ONE TABLET BY  MOUTH ONCE DAILY WITH BREAKFAST (Patient taking differently: Take 500 mg by mouth daily with breakfast. ) 90 tablet 3  . Misc. Devices (ROLLATOR ULTRA-LIGHT) MISC With seat.  Diabetic neuropathy, knee arthritis 1 each 0  . nitroGLYCERIN (NITROSTAT) 0.4 MG SL tablet Place 1 tablet (0.4 mg total) under the tongue every 5 (five) minutes as needed for chest pain. x3 doses as needed for chest pain (Patient taking differently: Place 0.4 mg under the tongue every 5 (five) minutes as needed for chest pain. AND CALL 9-1-1 IF NO RELIEF) 25 tablet 0  . rosuvastatin (CRESTOR) 10 MG tablet TAKE ONE TABLET BY MOUTH ONCE DAILY IN THE MORNING 90 tablet 3  . apixaban (ELIQUIS) 5 MG TABS tablet Take 1 tablet (5 mg total) by mouth 2 (two) times daily. 180 tablet 2  . furosemide (LASIX) 20 MG tablet Take 1 tab by mouth twice daily x 7 days, then go back to taking 1 tab by mouth daily thereafter. 37 tablet 1  . potassium chloride (K-DUR) 10 MEQ tablet Take 1 tablet (10 mEq total) by mouth daily. Take for 7 days only, with increased lasix. 7 tablet 0   No current facility-administered medications for this visit.     Allergies:   Lyrica [pregabalin]; Amlodipine; and Sulfonamide derivatives    Social History:  The patient  reports that she has never smoked. She has never used smokeless tobacco. She reports that she does not drink alcohol or use drugs.   Family History:  The patient's family history includes Cancer in her father and mother.    ROS:  General:no colds or fevers, no weight changes Skin:no rashes + ulcer/cyst are above umbilicus   HEENT:no blurred vision, no congestion CV:see HPI PUL:see HPI GI:no diarrhea constipation or melena, no indigestion GU:no hematuria, no dysuria MS:no joint pain, no claudication Neuro:no syncope, no lightheadedness Endo:+ diabetes, no thyroid disease  Wt Readings from Last 3 Encounters:  05/21/17 163 lb (73.9 kg)  04/16/17 162 lb 12.8 oz (73.8 kg)  04/14/17 163 lb  (73.9 kg)     PHYSICAL EXAM: VS:  BP (!) 144/76   Pulse 68   Ht 5' 3"  (1.6 m)   Wt 163 lb (73.9 kg)   LMP  (LMP Unknown)   BMI 28.87 kg/m  , BMI Body mass index is 28.87 kg/m. General:Pleasant affect, NAD Skin:Warm and dry, brisk capillary refill HEENT:normocephalic, sclera clear, mucus membranes moist Neck:supple, no JVD, no bruits  Heart: irreg with soft systolic murmur, no gallup, rub or click Lungs:clear without rales, rhonchi, or wheezes CVE:LFYB, non tender, + BS, do not palpate liver spleen or masses Ext: minimal B/L  lower ext edema on rt slight edema on Lt. , 2+ pedal pulses, 2+ radial pulses Neuro:alert and oriented X 3, MAE, follows commands, + facial symmetry  EKG:  EKG is done today. Personally reviewed, a-fib, V pacing, unchanged from prior.  Recent Labs: 02/11/2017: TSH 3.500 03/27/2017: ALT 13 03/28/2017: B Natriuretic Peptide 772.5; Magnesium 2.3 04/02/2017: Hemoglobin 12.0; Platelets 249 05/21/2017: BUN 18; Creatinine, Ser 1.21; NT-Pro BNP 9,390; Potassium 3.8; Sodium 146    Lipid Panel    Component Value Date/Time   CHOL 101 06/09/2015 0330   TRIG 52 06/09/2015 0330   HDL 38 (L) 06/09/2015 0330   CHOLHDL 2.7 06/09/2015 0330   VLDL 10 06/09/2015 0330   LDLCALC 53 06/09/2015 0330   LDLDIRECT 152.0 10/10/2013 1619       Other studies Reviewed: Additional studies/ records that were reviewed today include: . Echo 02/2017 Study Conclusions  - Left ventricle: The cavity size was normal. Wall thickness was   normal. Systolic function was moderately to severely reduced. The   estimated ejection fraction was in the range of 30% to 35%. - Aortic valve: Mildly calcified annulus. Mildly thickened, mildly   calcified leaflets. There was mild regurgitation. - Mitral valve: There was mild to moderate regurgitation. - Right ventricle: Systolic function was mildly to moderately   reduced. - Tricuspid valve: There was mild-moderate regurgitation. - Pulmonary  arteries: Systolic pressure was moderately increased.   PA peak pressure: 46 mm Hg (S).    ASSESSMENT AND PLAN:  1.  Acute on chronic diastolic and systolic HF - - BNP today 9000, we will increase lasix to 20 mg PO BID x 7 days, then down to 20 mg po daily, add KCl 10 mEq daily - follow up with a PA in 2-3 weeks with BMP and BNP - she is very frail and sensitive to diuretics, previously on higher dose of diuretics she developed acute renal failure with mental status changes  2.  AKI on stage 3 CKD , much improved, todays crea 1.2.  Avoid ACE/ARB.  3.   HTN, stable.    4. A fib, on Eliquis at lower dose due to AKI, now increased to 5 mg po BID.    5. SSS with PPM  6. DM-2 followed by PCP and stable.    Current medicines are reviewed with the patient today.  The patient Has no concerns regarding medicines.  The following changes have been made:  See above Labs/ tests ordered today include:see above  Disposition:   FU:  see above  Signed, Ena Dawley, MD  05/23/2017 10:14 AM

## 2017-05-21 NOTE — Telephone Encounter (Deleted)
[name], MD sent to Brynda Peon, RN  Cc: Nuala Alpha, LPN        I agree with Eliquis 5 mg po BID.

## 2017-05-22 ENCOUNTER — Telehealth: Payer: Self-pay | Admitting: *Deleted

## 2017-05-22 DIAGNOSIS — R7989 Other specified abnormal findings of blood chemistry: Secondary | ICD-10-CM

## 2017-05-22 DIAGNOSIS — I5033 Acute on chronic diastolic (congestive) heart failure: Secondary | ICD-10-CM

## 2017-05-22 LAB — BASIC METABOLIC PANEL
BUN/Creatinine Ratio: 15 (ref 12–28)
BUN: 18 mg/dL (ref 8–27)
CO2: 24 mmol/L (ref 20–29)
Calcium: 10 mg/dL (ref 8.7–10.3)
Chloride: 107 mmol/L — ABNORMAL HIGH (ref 96–106)
Creatinine, Ser: 1.21 mg/dL — ABNORMAL HIGH (ref 0.57–1.00)
GFR calc Af Amer: 46 mL/min/{1.73_m2} — ABNORMAL LOW (ref >59)
GFR calc non Af Amer: 40 mL/min/{1.73_m2} — ABNORMAL LOW (ref >59)
Glucose: 101 mg/dL — ABNORMAL HIGH (ref 65–99)
Potassium: 3.8 mmol/L (ref 3.5–5.2)
Sodium: 146 mmol/L — ABNORMAL HIGH (ref 134–144)

## 2017-05-22 LAB — PRO B NATRIURETIC PEPTIDE: NT-Pro BNP: 9390 pg/mL — ABNORMAL HIGH (ref 0–738)

## 2017-05-22 MED ORDER — FUROSEMIDE 20 MG PO TABS: ORAL_TABLET | ORAL | 1 refills | Status: DC

## 2017-05-22 MED ORDER — POTASSIUM CHLORIDE ER 10 MEQ PO TBCR: 10.0000 meq | EXTENDED_RELEASE_TABLET | Freq: Every day | ORAL | 0 refills | Status: DC

## 2017-05-22 NOTE — Telephone Encounter (Addendum)
CLARIFICATION ORDER OBTAINED VERBALLY FROM DR [name] ON THIS PTS LAB RESULTS:  Notified the pts Daughter (on DPR and primary caretaker) that per Dr [name], the pt should increase her lasix to 20 mg po BID x 7 days, then go back to taking lasix 20 mg po daily thereafter and also take K-DUR 10 mEq po daily x 7 days (with the increase in lasix).  Informed the pts daughter that per Dr [name], she should come in for lab and to see a PA same day, in 3 weeks. Confirmed the pharmacy of choice with the pts Daughter. Scheduled the pt a lab appt, same day as she see's Cecilie Kicks NP, on 8/10.  We will recheck a BMET and PRO-BNP.  Informed the pts daughter that her appt with Mickel Baas will be on 8/10 at 0900, with lab same day.  Advised the pts daughter to arrive 15 mins prior too this appt.  Daughter verbalized understanding and agrees with this plan.

## 2017-05-22 NOTE — Telephone Encounter (Signed)
-----   Message from [name], MD sent at 05/22/2017  4:36 PM EDT ----- Please tell her to take lasix 20 mEq daily for the next 7 days, also start KCl 10 MEq daily, she should come for BMP, BNP and follow up in 3 weeks with a PA.

## 2017-06-01 DIAGNOSIS — I110 Hypertensive heart disease with heart failure: Secondary | ICD-10-CM | POA: Diagnosis not present

## 2017-06-01 DIAGNOSIS — Z853 Personal history of malignant neoplasm of breast: Secondary | ICD-10-CM

## 2017-06-01 DIAGNOSIS — Z9181 History of falling: Secondary | ICD-10-CM | POA: Diagnosis not present

## 2017-06-01 DIAGNOSIS — Z950 Presence of cardiac pacemaker: Secondary | ICD-10-CM | POA: Diagnosis not present

## 2017-06-01 DIAGNOSIS — Z9013 Acquired absence of bilateral breasts and nipples: Secondary | ICD-10-CM

## 2017-06-01 DIAGNOSIS — E785 Hyperlipidemia, unspecified: Secondary | ICD-10-CM | POA: Diagnosis not present

## 2017-06-01 DIAGNOSIS — I2510 Atherosclerotic heart disease of native coronary artery without angina pectoris: Secondary | ICD-10-CM | POA: Diagnosis not present

## 2017-06-01 DIAGNOSIS — N184 Chronic kidney disease, stage 4 (severe): Secondary | ICD-10-CM | POA: Diagnosis not present

## 2017-06-01 DIAGNOSIS — I5043 Acute on chronic combined systolic (congestive) and diastolic (congestive) heart failure: Secondary | ICD-10-CM | POA: Diagnosis not present

## 2017-06-01 DIAGNOSIS — Z794 Long term (current) use of insulin: Secondary | ICD-10-CM

## 2017-06-01 DIAGNOSIS — E1140 Type 2 diabetes mellitus with diabetic neuropathy, unspecified: Secondary | ICD-10-CM | POA: Diagnosis not present

## 2017-06-01 DIAGNOSIS — Z7901 Long term (current) use of anticoagulants: Secondary | ICD-10-CM

## 2017-06-01 DIAGNOSIS — E1122 Type 2 diabetes mellitus with diabetic chronic kidney disease: Secondary | ICD-10-CM | POA: Diagnosis not present

## 2017-06-01 DIAGNOSIS — Z87440 Personal history of urinary (tract) infections: Secondary | ICD-10-CM | POA: Diagnosis not present

## 2017-06-01 DIAGNOSIS — R239 Unspecified skin changes: Secondary | ICD-10-CM | POA: Diagnosis not present

## 2017-06-01 DIAGNOSIS — I480 Paroxysmal atrial fibrillation: Secondary | ICD-10-CM | POA: Diagnosis not present

## 2017-06-01 DIAGNOSIS — Z955 Presence of coronary angioplasty implant and graft: Secondary | ICD-10-CM

## 2017-06-03 ENCOUNTER — Telehealth: Payer: Self-pay | Admitting: Cardiology

## 2017-06-03 ENCOUNTER — Ambulatory Visit (INDEPENDENT_AMBULATORY_CARE_PROVIDER_SITE_OTHER): Payer: Medicare Other | Admitting: *Deleted

## 2017-06-03 DIAGNOSIS — I495 Sick sinus syndrome: Secondary | ICD-10-CM | POA: Diagnosis not present

## 2017-06-03 NOTE — Telephone Encounter (Signed)
Confirmed remote transmission w/ pt daughter.   

## 2017-06-04 ENCOUNTER — Encounter: Payer: Self-pay | Admitting: Cardiology

## 2017-06-04 NOTE — Progress Notes (Signed)
Remote pacemaker transmission.   

## 2017-06-05 LAB — CUP PACEART REMOTE DEVICE CHECK
Battery Impedance: 560 Ohm
Battery Remaining Longevity: 82 mo
Battery Voltage: 2.78 V
Brady Statistic AP VP Percent: 14 %
Brady Statistic AP VS Percent: 38 %
Brady Statistic AS VP Percent: 2 %
Date Time Interrogation Session: 20180801192709
Implantable Lead Implant Date: 20110912
Implantable Lead Implant Date: 20110912
Implantable Lead Location: 753859
Implantable Lead Location: 753860
Implantable Lead Model: 5076
Implantable Pulse Generator Implant Date: 20110912
Lead Channel Impedance Value: 655 Ohm
Lead Channel Pacing Threshold Amplitude: 0.75 V
Lead Channel Pacing Threshold Pulse Width: 0.4 ms
Lead Channel Setting Pacing Amplitude: 2.5 V
Lead Channel Setting Sensing Sensitivity: 2 mV
MDC IDC MSMT LEADCHNL RA IMPEDANCE VALUE: 494 Ohm
MDC IDC MSMT LEADCHNL RA SENSING INTR AMPL: 0.7 mV
MDC IDC MSMT LEADCHNL RV PACING THRESHOLD AMPLITUDE: 0.625 V
MDC IDC MSMT LEADCHNL RV PACING THRESHOLD PULSEWIDTH: 0.4 ms
MDC IDC MSMT LEADCHNL RV SENSING INTR AMPL: 5.6 mV
MDC IDC SET LEADCHNL RA PACING AMPLITUDE: 2 V
MDC IDC SET LEADCHNL RV PACING PULSEWIDTH: 0.4 ms
MDC IDC STAT BRADY AS VS PERCENT: 46 %

## 2017-06-11 NOTE — Progress Notes (Signed)
Cardiology Office Note:    Date:  06/12/2017   ID:  Jessica [name], DOB December 29, 1926, MRN [id number]  PCP:  Binnie Rail, MD  Cardiologist:  Dr Ena Dawley  Referring MD: Binnie Rail, MD   Chief Complaint  Patient presents with  . Follow-up    CHF    History of Present Illness:    Jessica [name] is a 83 y.o. female with a past medical history significant for Hypotension, AKI, systolic and diastolic dysfunction, PAF on Eliquis, CAD with stents and 2010, diabetes type 2, HLD, SSS with MDT PPM in 2011. Last echocardiogram in 02/24/17 showed a decrease in EF from 60% to 35%. The patient was advised to have ischemic testing, however, she and her daughter declined, given the patient's age.  The patient was last seen on 05/21/17 by Dr. [name] at which time she was feeling fairly well, with occasional PND and mild lower extremity edema. She is diuresed with Lasix 20 mg twice a day for 7 days starting on 7/20 and then reduce to daily. Her serum creatinine on 7/19 was 1.21 and Pro  BNP was 9,390.  The patient is here today with her daughter. She reports that she did take the Lasix twice a day for 7 days and is now down to once a day however she takes an extra dose once or twice a week when she has tightness in her abdomen shortness of breath. This seems work well for her. Her home weights have been within 2-3 pounds consistently. She has completed physical therapy and continues to do laps in her house. She does not tolerate much activity out in the heat. Her ankle edema is unchanged. She denies orthopnea or PND.  Remote device check on 06/05/17 showed normal device function, battery status and lead stable, histograms reviewed and appropriate.  Past Medical History:  Diagnosis Date  . Breast cancer ([place])   . CAD (coronary artery disease)    a. s/p DESx2 to mid and distal RCA in 2010.  Marland Kitchen Cholelithiasis 09/12/2013   Lap Chole on 09/14/13   . Chronic diastolic CHF (congestive heart failure)  ([place])    a. Echo 06/09/15: EF 55-60%, normal wall motion, mild AI, mild to moderate MR, mild LAE, mildly reduced RVSF, mild RAE, moderate TR, PASP 53 mmHg  . CKD (chronic kidney disease), stage III   . Diabetes mellitus    NON INSULIN DEPENDENT  . DJD (degenerative joint disease)   . GERD (gastroesophageal reflux disease)   . Hemorrhoids   . History of diabetic neuropathy   . Hypercholesterolemia   . Hypertension   . Hypertensive heart disease   . Paroxysmal atrial fibrillation (HCC)    a. discovered on PPM interrogation 12/15, CHAD2VASC score is 6. b. decompensation with dCHF in 06/2015 possibly due to AF, s/p DCCV.  . SSS (sick sinus syndrome) ([place])    a. s/p Medtronic PPM by JA for SSS and syncope 9/11.    Past Surgical History:  Procedure Laterality Date  . CARDIAC CATHETERIZATION  07/05/2009   EF 55-60%  . CARDIOVERSION N/A 06/11/2015   Procedure: CARDIOVERSION;  Surgeon: [name], MD;  Location: [facility] ENDOSCOPY;  Service: Cardiovascular;  Laterality: N/A;  . CHOLECYSTECTOMY N/A 09/14/2013   Procedure: LAPAROSCOPIC CHOLECYSTECTOMY WITH INTRAOPERATIVE CHOLANGIOGRAM;  Surgeon: [name], MD;  Location: [place];  Service: General;  Laterality: N/A;  . COLONOSCOPY    . ERCP N/A 09/13/2013   Procedure: ENDOSCOPIC RETROGRADE CHOLANGIOPANCREATOGRAPHY (ERCP);  Surgeon:  [name], MD;  Location: [place];  Service: Endoscopy;  Laterality: N/A;  . INSERT / REPLACE / REMOVE PACEMAKER  9/11   SSS and syncope, implanted by JA (MDT)  . MASTECTOMY  1992   BILATERAL WITH RECONSTRUCTION    Current Medications: Current Meds  Medication Sig  . apixaban (ELIQUIS) 5 MG TABS tablet Take 1 tablet (5 mg total) by mouth 2 (two) times daily.  Marland Kitchen b complex vitamins tablet Take 1 tablet by mouth daily.  . blood glucose meter kit and supplies KIT Dispense based on patient and insurance preference. Test sugars once daily and prn as directed. (FOR ICD-9 250.00, 250.01).  . Calcium  Citrate-Vitamin D (CALCIUM CITRATE + D PO) Take 1 tablet by mouth daily with breakfast.  . carvedilol (COREG) 12.5 MG tablet Take 1 tablet (12.5 mg total) by mouth 2 (two) times daily.  Marland Kitchen ECHINACEA PO Take 750 mg by mouth daily.  . fish oil-omega-3 fatty acids 1000 MG capsule Take 1 g by mouth every morning.   . furosemide (LASIX) 20 MG tablet Take 1 tab by mouth twice daily x 7 days, then go back to taking 1 tab by mouth daily thereafter.  . gabapentin (NEURONTIN) 100 MG capsule TAKE ONE CAPSULE BY MOUTH AT BEDTIME. TAKE ON ADDITION TO THE 300 MG CAP YOU ARE ALREADY TAKING AT NIGHT  . gabapentin (NEURONTIN) 300 MG capsule TAKE 1 CAPSULE BY MOUTH TWICE DAILY  . metFORMIN (GLUCOPHAGE) 500 MG tablet TAKE ONE TABLET BY MOUTH ONCE DAILY WITH BREAKFAST  . Misc. Devices (ROLLATOR ULTRA-LIGHT) MISC With seat.  Diabetic neuropathy, knee arthritis  . nitroGLYCERIN (NITROSTAT) 0.4 MG SL tablet Place 1 tablet (0.4 mg total) under the tongue every 5 (five) minutes as needed for chest pain. x3 doses as needed for chest pain  . potassium chloride (K-DUR) 10 MEQ tablet Take 1 tablet (10 mEq total) by mouth daily. Take for 7 days only, with increased lasix.  Marland Kitchen rosuvastatin (CRESTOR) 10 MG tablet TAKE ONE TABLET BY MOUTH ONCE DAILY IN THE MORNING     Allergies:   Lyrica [pregabalin]; Amlodipine; and Sulfonamide derivatives   Social History   Social History  . Marital status: Widowed    Spouse name: N/A  . Number of children: N/A  . Years of education: N/A   Social History Main Topics  . Smoking status: Never Smoker  . Smokeless tobacco: Never Used  . Alcohol use No  . Drug use: No  . Sexual activity: Not Asked   Other Topics Concern  . None   Social History Narrative  . None     Family History: The patient's family history includes Cancer in her father and mother. ROS:   Please see the history of present illness.     All other systems reviewed and are negative.  EKGs/Labs/Other Studies  Reviewed:    The following studies were reviewed today:  Echocardiogram 02/24/17: EF 30-35%, mild AR, mild-moderate MR, mildly to moderately reduced RV function, PA peak pressure 46 mmHg    EKG:  EKG is not ordered today.    Recent Labs: 02/11/2017: TSH 3.500 03/27/2017: ALT 13 03/28/2017: B Natriuretic Peptide 772.5; Magnesium 2.3 04/02/2017: Hemoglobin 12.0; Platelets 249 05/21/2017: BUN 18; Creatinine, Ser 1.21; NT-Pro BNP 9,390; Potassium 3.8; Sodium 146   Recent Lipid Panel    Component Value Date/Time   CHOL 101 06/09/2015 0330   TRIG 52 06/09/2015 0330   HDL 38 (L) 06/09/2015 0330   CHOLHDL 2.7 06/09/2015  0330   VLDL 10 06/09/2015 0330   LDLCALC 53 06/09/2015 0330   LDLDIRECT 152.0 10/10/2013 1619    Physical Exam:    VS:  BP 122/76   Pulse 90   Ht 5' 3" (1.6 m)   Wt 161 lb (73 kg)   LMP  (LMP Unknown)   SpO2 95%   BMI 28.52 kg/m     Wt Readings from Last 3 Encounters:  06/12/17 161 lb (73 kg)  05/21/17 163 lb (73.9 kg)  04/16/17 162 lb 12.8 oz (73.8 kg)     Physical Exam  Constitutional: She is oriented to person, place, and time. She appears well-developed and well-nourished. No distress.  HENT:  Head: Normocephalic and atraumatic.  Neck: Normal range of motion. Neck supple. No JVD present. Carotid bruit is not present.  Cardiovascular: Normal rate.  An irregularly irregular rhythm present. Exam reveals no gallop and no friction rub.   Murmur heard.  Systolic murmur is present with a grade of 2/6  Pulmonary/Chest: Effort normal and breath sounds normal. No respiratory distress. She has no wheezes. She has no rales.  Abdominal: Soft. There is no tenderness.  Musculoskeletal: Normal range of motion. She exhibits edema.  Neurological: She is alert and oriented to person, place, and time.  Skin: Skin is warm and dry.  Psychiatric: She has a normal mood and affect. Her behavior is normal.  Vitals reviewed.    ASSESSMENT:    1. Acute renal failure with  acute tubular necrosis superimposed on stage 3 chronic kidney disease ([place])   2. Essential hypertension   3. AF (paroxysmal atrial fibrillation) ([place])   4. SSS (sick sinus syndrome) ([place])   5. Type 2 diabetes mellitus with diabetic neuropathy, without long-term current use of insulin (HCC)    PLAN:    In order of problems listed above:  1. Acute on chronic diastolic and systolic heart failure -Echocardiogram in 02/2017 showed decrease in EF from 60% to 35%. The patient deferred ischemic workup.  -She had an elevated BNP of 9000 on 7/19. She took Lasix 20 mg twice a day for 7 days then back to 20 mg daily. She does take a second dose as needed on her abdomen feels tight and she feels more short of breath. This seems to work for her. -Her weight has been stable. She has mild shortness of breath with exertion and does not tolerate activities out in the heat. She denies orthopnea or PND. She has mild ankle edema that is unchanged. -Repeat BNP and BMP today -The patient is very frail and sensitive to diuretics, previously on higher doses of diuretic caused her to develop acute renal failure and mental status changes. -We'll continue her Lasix 20 mg daily and an extra dose as needed as she has been doing.  2. Acute on chronic stage III kidney disease -Serum creatinine was 1.2 on 7/19, which was improved. Avoiding ACE-I/ARB.  -Recheck BMP today   3. Hypertension -Blood pressure is good today  4. Atrial fibrillation -On Eliquis for stroke risk reduction. Lower dose due to CKD. If SCr remains optimal may need to increase dose.  5. SSS with PPM -Recent to remote download shows normal function of PPM   6. Diabetes type 2 -Management by primary care   Medication Adjustments/Labs and Tests Ordered: Current medicines are reviewed at length with the patient today.  Concerns regarding medicines are outlined above. Labs and tests ordered and medication changes are outlined in the patient  instructions below:  Patient Instructions  Medication Instructions:  Your physician recommends that you continue on your current medications as directed. Please refer to the Current Medication list given to you today.   Labwork: Your physician recommends that you return for lab work today for BMET, BNP   Testing/Procedures: None Ordered   Follow-Up: Your physician recommends that you schedule a follow-up appointment in: 3 months with Dr. [name].   Any Other Special Instructions Will Be Listed Below (If Applicable).     If you need a refill on your cardiac medications before your next appointment, please call your pharmacy.      Signed, Daune Perch, NP  06/12/2017 1:16 PM    [facility]

## 2017-06-12 ENCOUNTER — Ambulatory Visit (INDEPENDENT_AMBULATORY_CARE_PROVIDER_SITE_OTHER): Payer: Medicare Other | Admitting: Cardiology

## 2017-06-12 ENCOUNTER — Encounter: Payer: Self-pay | Admitting: Cardiology

## 2017-06-12 ENCOUNTER — Other Ambulatory Visit: Payer: Medicare Other

## 2017-06-12 VITALS — BP 122/76 | HR 90 | Ht 63.0 in | Wt 161.0 lb

## 2017-06-12 DIAGNOSIS — N183 Chronic kidney disease, stage 3 unspecified: Secondary | ICD-10-CM

## 2017-06-12 DIAGNOSIS — I5043 Acute on chronic combined systolic (congestive) and diastolic (congestive) heart failure: Secondary | ICD-10-CM | POA: Diagnosis not present

## 2017-06-12 DIAGNOSIS — I480 Paroxysmal atrial fibrillation: Secondary | ICD-10-CM | POA: Diagnosis not present

## 2017-06-12 DIAGNOSIS — I10 Essential (primary) hypertension: Secondary | ICD-10-CM | POA: Diagnosis not present

## 2017-06-12 DIAGNOSIS — E1140 Type 2 diabetes mellitus with diabetic neuropathy, unspecified: Secondary | ICD-10-CM | POA: Diagnosis not present

## 2017-06-12 DIAGNOSIS — I495 Sick sinus syndrome: Secondary | ICD-10-CM | POA: Diagnosis not present

## 2017-06-12 LAB — PRO B NATRIURETIC PEPTIDE: NT-Pro BNP: 7665 pg/mL — ABNORMAL HIGH (ref 0–738)

## 2017-06-12 LAB — BASIC METABOLIC PANEL
BUN / CREAT RATIO: 16 (ref 12–28)
BUN: 21 mg/dL (ref 8–27)
CO2: 24 mmol/L (ref 20–29)
CREATININE: 1.31 mg/dL — AB (ref 0.57–1.00)
Calcium: 9.7 mg/dL (ref 8.7–10.3)
Chloride: 106 mmol/L (ref 96–106)
GFR, EST AFRICAN AMERICAN: 42 mL/min/{1.73_m2} — AB (ref >59)
GFR, EST NON AFRICAN AMERICAN: 36 mL/min/{1.73_m2} — AB (ref >59)
Glucose: 140 mg/dL — ABNORMAL HIGH (ref 65–99)
Potassium: 3.8 mmol/L (ref 3.5–5.2)
SODIUM: 145 mmol/L — AB (ref 134–144)

## 2017-06-12 NOTE — Patient Instructions (Signed)
Medication Instructions:  Your physician recommends that you continue on your current medications as directed. Please refer to the Current Medication list given to you today.   Labwork: Your physician recommends that you return for lab work today for BMET, BNP   Testing/Procedures: None Ordered   Follow-Up: Your physician recommends that you schedule a follow-up appointment in: 3 months with Dr. [name].   Any Other Special Instructions Will Be Listed Below (If Applicable).     If you need a refill on your cardiac medications before your next appointment, please call your pharmacy.

## 2017-07-13 ENCOUNTER — Other Ambulatory Visit: Payer: Self-pay | Admitting: Internal Medicine

## 2017-07-14 ENCOUNTER — Other Ambulatory Visit: Payer: Self-pay | Admitting: Internal Medicine

## 2017-07-23 NOTE — Progress Notes (Signed)
Cardiology Office Note   Date:  07/24/2017   ID:  LASHAWNTA [name], DOB [birth year], MRN [id number]  PCP:  Binnie Rail, MD  Cardiologist:  Dr. [name]    Chief Complaint  Patient presents with  . Edema    Chronic combined systolic and diastolic HF      History of Present Illness: Jessica [name] is a 83 y.o. female who presents for increasing edema.   She has permanent a fib on Eliquis.  Also hx of CAD, with stents in 2500, chronic diastolic HF, DM, HLD, SSS with MDT PPM in 2011. Last visit 5//31/18 she was stable, and stable wt.  She is on lasix 20 mg daily.   CBC was normal and BMP with improved Cr and a week later Cr down to 1.07.   Last echo 02/24/17 EF 30-35%, mild AR, mild to mod MR, TR mild to mod. PA pk pressure 46 mmHg. Last cath 2010 with stents.  Saw Dr. [name] 05/2018 and lasix increased for 7 days.Last visit 06/12/17 she had improved.      Today she has increased wt and despite taking extra dose of lasix last 4 days she is SOB and feels needs more lasix.  She denies chest pain.  She monitors her salt, very little.  Her last pacer check ws 06/05/17  Normal.    The biopsy on area above umbilicus was + for boil.  It has healed with scar.   Past Medical History:  Diagnosis Date  . Breast cancer ([place])   . CAD (coronary artery disease)    a. s/p DESx2 to mid and distal RCA in 2010.  Jessica [name] Cholelithiasis 09/12/2013   Lap Chole on 09/14/13   . Chronic diastolic CHF (congestive heart failure) ([place])    a. Echo 06/09/15: EF 55-60%, normal wall motion, mild AI, mild to moderate MR, mild LAE, mildly reduced RVSF, mild RAE, moderate TR, PASP 53 mmHg  . CKD (chronic kidney disease), stage III   . Diabetes mellitus    NON INSULIN DEPENDENT  . DJD (degenerative joint disease)   . GERD (gastroesophageal reflux disease)   . Hemorrhoids   . History of diabetic neuropathy   . Hypercholesterolemia   . Hypertension   . Hypertensive heart disease   . Paroxysmal atrial fibrillation (HCC)     a. discovered on PPM interrogation 12/15, CHAD2VASC score is 6. b. decompensation with dCHF in 06/2015 possibly due to AF, s/p DCCV.  . SSS (sick sinus syndrome) ([place])    a. s/p Medtronic PPM by JA for SSS and syncope 9/11.    Past Surgical History:  Procedure Laterality Date  . CARDIAC CATHETERIZATION  07/05/2009   EF 55-60%  . CARDIOVERSION N/A 06/11/2015   Procedure: CARDIOVERSION;  Surgeon: [name], MD;  Location: [facility] ENDOSCOPY;  Service: Cardiovascular;  Laterality: N/A;  . CHOLECYSTECTOMY N/A 09/14/2013   Procedure: LAPAROSCOPIC CHOLECYSTECTOMY WITH INTRAOPERATIVE CHOLANGIOGRAM;  Surgeon: [name], MD;  Location: [facility];  Service: General;  Laterality: N/A;  . COLONOSCOPY    . ERCP N/A 09/13/2013   Procedure: ENDOSCOPIC RETROGRADE CHOLANGIOPANCREATOGRAPHY (ERCP);  Surgeon: [name], MD;  Location: [facility] ENDOSCOPY;  Service: Endoscopy;  Laterality: N/A;  . INSERT / REPLACE / REMOVE PACEMAKER  9/11   SSS and syncope, implanted by JA (MDT)  . MASTECTOMY  1992   BILATERAL WITH RECONSTRUCTION     Current Outpatient Prescriptions  Medication Sig Dispense Refill  . apixaban (ELIQUIS) 5 MG TABS tablet Take  1 tablet (5 mg total) by mouth 2 (two) times daily. 180 tablet 2  . b complex vitamins tablet Take 1 tablet by mouth daily.    . blood glucose meter kit and supplies KIT Dispense based on patient and insurance preference. Test sugars once daily and prn as directed. (FOR ICD-9 250.00, 250.01). 1 each 0  . Calcium Citrate-Vitamin D (CALCIUM CITRATE + D PO) Take 1 tablet by mouth daily with breakfast.    . carvedilol (COREG) 12.5 MG tablet Take 1 tablet (12.5 mg total) by mouth 2 (two) times daily. 60 tablet 0  . ECHINACEA PO Take 750 mg by mouth daily.    . fish oil-omega-3 fatty acids 1000 MG capsule Take 1 g by mouth every morning.     . furosemide (LASIX) 20 MG tablet Take 1 tab by mouth twice daily x 7 days, then go back to taking 1 tab by mouth daily thereafter.  37 tablet 1  . gabapentin (NEURONTIN) 100 MG capsule TAKE ONE CAPSULE BY MOUTH AT BEDTIME. TAKE ON ADDITION TO THE 300 MG CAP YOU ARE ALREADY TAKING AT NIGHT 90 capsule 3  . gabapentin (NEURONTIN) 300 MG capsule TAKE 1 CAPSULE BY MOUTH TWICE DAILY 180 capsule 1  . metFORMIN (GLUCOPHAGE) 500 MG tablet TAKE ONE TABLET BY MOUTH ONCE DAILY WITH BREAKFAST 90 tablet 0  . Misc. Devices (ROLLATOR ULTRA-LIGHT) MISC With seat.  Diabetic neuropathy, knee arthritis 1 each 0  . nitroGLYCERIN (NITROSTAT) 0.4 MG SL tablet Place 1 tablet (0.4 mg total) under the tongue every 5 (five) minutes as needed for chest pain. x3 doses as needed for chest pain 25 tablet 0  . potassium chloride (K-DUR) 10 MEQ tablet Take 1 tablet (10 mEq total) by mouth daily. Take for 7 days only, with increased lasix. 7 tablet 0  . rosuvastatin (CRESTOR) 10 MG tablet TAKE ONE TABLET BY MOUTH ONCE DAILY IN THE MORNING 90 tablet 1   No current facility-administered medications for this visit.     Allergies:   Lyrica [pregabalin]; Amlodipine; and Sulfonamide derivatives    Social History:  The patient  reports that she has never smoked. She has never used smokeless tobacco. She reports that she does not drink alcohol or use drugs.   Family History:  The patient's family history includes Cancer in her father and mother.    ROS:  General:no colds or fevers, no weight changes Skin:no rashes or ulcers HEENT:no blurred vision, no congestion CV:see HPI PUL:see HPI GI:no diarrhea constipation or melena, no indigestion GU:no hematuria, no dysuria MS:no joint pain, no claudication Neuro:no syncope, no lightheadedness Endo:+ diabetes-stable, no thyroid disease  Wt Readings from Last 3 Encounters:  07/24/17 162 lb (73.5 kg)  06/12/17 161 lb (73 kg)  05/21/17 163 lb (73.9 kg)     PHYSICAL EXAM: VS:  BP 138/86   Pulse 68   Ht 5' 3"  (1.6 m)   Wt 162 lb (73.5 kg)   LMP  (LMP Unknown)   SpO2 97%   BMI 28.70 kg/m  , BMI Body mass  index is 28.7 kg/m. General:Pleasant affect, NAD Skin:Warm and dry, brisk capillary refill HEENT:normocephalic, sclera clear, mucus membranes moist Neck:supple, no JVD, no bruits  Heart:irreg irreg, with soft systolic murmur, no gallup, rub or click Lungs:clear to diminished without rales, rhonchi, or wheezes UJW:JXBJ, non tender, + BS, do not palpate liver spleen or masses Ext:1-2+ lower ext edema, 2+ pedal pulses, 2+ radial pulses Neuro:alert and oriented X 3, MAE, follows commands, +  facial symmetry    EKG:  EKG is NOT ordered today.   Recent Labs: 02/11/2017: TSH 3.500 03/27/2017: ALT 13 03/28/2017: B Natriuretic Peptide 772.5; Magnesium 2.3 04/02/2017: Hemoglobin 12.0; Platelets 249 06/12/2017: BUN 21; Creatinine, Ser 1.31; NT-Pro BNP 7,665; Potassium 3.8; Sodium 145    Lipid Panel    Component Value Date/Time   CHOL 101 06/09/2015 0330   TRIG 52 06/09/2015 0330   HDL 38 (L) 06/09/2015 0330   CHOLHDL 2.7 06/09/2015 0330   VLDL 10 06/09/2015 0330   LDLCALC 53 06/09/2015 0330   LDLDIRECT 152.0 10/10/2013 1619       Other studies Reviewed: Additional studies/ records that were reviewed today include:. ECHO Study Conclusions  - Left ventricle: The cavity size was normal. Wall thickness was   normal. Systolic function was moderately to severely reduced. The   estimated ejection fraction was in the range of 30% to 35%. - Aortic valve: Mildly calcified annulus. Mildly thickened, mildly   calcified leaflets. There was mild regurgitation. - Mitral valve: There was mild to moderate regurgitation. - Right ventricle: Systolic function was mildly to moderately   reduced. - Tricuspid valve: There was mild-moderate regurgitation. - Pulmonary arteries: Systolic pressure was moderately increased.   PA peak pressure: 46 mm Hg (S).   ASSESSMENT AND PLAN:  1.  Acute on chronic diastolic and systolic HF - she has increased her lasix to 20 mg BID for 4 days with minimal  improvement.  Will go to 40 mg in AM and 20 in pm for 7 days then to 20 mg daily and another 20 mg PRN for edema or wt gain.  Understands importance of monitoring salt.  Monitor closely - she has developed AKE and AMS status with increased diuretic use in past.  Follow up in 2-3 weeks with APP.  2. CM with EF 30-35% pt and daughter did not wish to have cardiac cath.   3.  CKD 3 will check BMP today with need to increase diuretic.  Avoid ACE/ARB  4.  HTN has been higher with increased edema.  Should improve with  Diuretic increase. Today stable.   5.  A fib persistent on Eliquis at 5 mg BID will need to monitor with CKD and dose of Eliquis.  HR controlled today  6.   PPM with SSS  7 . DM-2 stable per PCP.   8.  Mod PH, monitoring.   Current medicines are reviewed with the patient today.  The patient Has no concerns regarding medicines.  The following changes have been made:  See above Labs/ tests ordered today include:see above  Disposition:   FU:  see above  Signed, Cecilie Kicks, NP  07/24/2017 10:19 AM    [facility], [place], [place], [place] Phone: [phone]; Fax: [phone]

## 2017-07-24 ENCOUNTER — Encounter: Payer: Self-pay | Admitting: Cardiology

## 2017-07-24 ENCOUNTER — Ambulatory Visit (INDEPENDENT_AMBULATORY_CARE_PROVIDER_SITE_OTHER): Payer: Medicare Other | Admitting: Cardiology

## 2017-07-24 ENCOUNTER — Encounter (INDEPENDENT_AMBULATORY_CARE_PROVIDER_SITE_OTHER): Payer: Self-pay

## 2017-07-24 VITALS — BP 138/86 | HR 68 | Ht 63.0 in | Wt 162.0 lb

## 2017-07-24 DIAGNOSIS — I5042 Chronic combined systolic (congestive) and diastolic (congestive) heart failure: Secondary | ICD-10-CM

## 2017-07-24 DIAGNOSIS — I482 Chronic atrial fibrillation: Secondary | ICD-10-CM

## 2017-07-24 DIAGNOSIS — I428 Other cardiomyopathies: Secondary | ICD-10-CM | POA: Diagnosis not present

## 2017-07-24 DIAGNOSIS — I10 Essential (primary) hypertension: Secondary | ICD-10-CM | POA: Diagnosis not present

## 2017-07-24 DIAGNOSIS — I5043 Acute on chronic combined systolic (congestive) and diastolic (congestive) heart failure: Secondary | ICD-10-CM | POA: Diagnosis not present

## 2017-07-24 DIAGNOSIS — N183 Chronic kidney disease, stage 3 unspecified: Secondary | ICD-10-CM

## 2017-07-24 DIAGNOSIS — Z950 Presence of cardiac pacemaker: Secondary | ICD-10-CM

## 2017-07-24 DIAGNOSIS — E1140 Type 2 diabetes mellitus with diabetic neuropathy, unspecified: Secondary | ICD-10-CM | POA: Diagnosis not present

## 2017-07-24 DIAGNOSIS — I4821 Permanent atrial fibrillation: Secondary | ICD-10-CM

## 2017-07-24 LAB — BASIC METABOLIC PANEL
BUN/Creatinine Ratio: 18 (ref 12–28)
BUN: 21 mg/dL (ref 8–27)
CALCIUM: 9.7 mg/dL (ref 8.7–10.3)
CHLORIDE: 107 mmol/L — AB (ref 96–106)
CO2: 25 mmol/L (ref 20–29)
Creatinine, Ser: 1.2 mg/dL — ABNORMAL HIGH (ref 0.57–1.00)
GFR calc non Af Amer: 40 mL/min/{1.73_m2} — ABNORMAL LOW (ref >59)
GFR, EST AFRICAN AMERICAN: 47 mL/min/{1.73_m2} — AB (ref >59)
Glucose: 132 mg/dL — ABNORMAL HIGH (ref 65–99)
Potassium: 3.8 mmol/L (ref 3.5–5.2)
Sodium: 146 mmol/L — ABNORMAL HIGH (ref 134–144)

## 2017-07-24 MED ORDER — FUROSEMIDE 20 MG PO TABS: 20.0000 mg | ORAL_TABLET | Freq: Every day | ORAL | 1 refills | Status: DC

## 2017-07-24 NOTE — Patient Instructions (Addendum)
Medication Instructions:  Your physician has recommended you make the following change in your medication:  Take furosemide 40 mg by mouth in the morning and 20 mg in the afternoon for 7 days.  After 7 days decrease to 20 mg by mouth daily. May take additional 20 mg by mouth daily as needed for weight gain.    Labwork: Lab work to be done today--BMP  Testing/Procedures: none  Follow-Up: You have a follow up appointment with Truitt Merle, NP on October 23,2018 at 2:00  Any Other Special Instructions Will Be Listed Below (If Applicable).     If you need a refill on your cardiac medications before your next appointment, please call your pharmacy.

## 2017-07-28 ENCOUNTER — Telehealth: Payer: Self-pay | Admitting: *Deleted

## 2017-07-28 DIAGNOSIS — Z79899 Other long term (current) drug therapy: Secondary | ICD-10-CM

## 2017-07-28 NOTE — Telephone Encounter (Signed)
-----   Message from Isaiah Serge, NP sent at 07/26/2017  7:50 PM EDT ----- Kidney function stable repeat in 1 week.

## 2017-07-30 LAB — HM DIABETES EYE EXAM

## 2017-08-03 ENCOUNTER — Other Ambulatory Visit: Payer: Medicare Other

## 2017-08-04 ENCOUNTER — Other Ambulatory Visit: Payer: Medicare Other

## 2017-08-04 DIAGNOSIS — Z79899 Other long term (current) drug therapy: Secondary | ICD-10-CM

## 2017-08-05 LAB — BASIC METABOLIC PANEL
BUN / CREAT RATIO: 18 (ref 12–28)
BUN: 20 mg/dL (ref 8–27)
CO2: 28 mmol/L (ref 20–29)
CREATININE: 1.09 mg/dL — AB (ref 0.57–1.00)
Calcium: 9.9 mg/dL (ref 8.7–10.3)
Chloride: 106 mmol/L (ref 96–106)
GFR calc Af Amer: 52 mL/min/{1.73_m2} — ABNORMAL LOW (ref >59)
GFR, EST NON AFRICAN AMERICAN: 45 mL/min/{1.73_m2} — AB (ref >59)
Glucose: 98 mg/dL (ref 65–99)
Potassium: 4 mmol/L (ref 3.5–5.2)
SODIUM: 146 mmol/L — AB (ref 134–144)

## 2017-08-06 ENCOUNTER — Encounter: Payer: Self-pay | Admitting: Internal Medicine

## 2017-08-13 ENCOUNTER — Ambulatory Visit: Payer: Medicare Other | Admitting: Podiatry

## 2017-08-14 ENCOUNTER — Ambulatory Visit (INDEPENDENT_AMBULATORY_CARE_PROVIDER_SITE_OTHER): Payer: Medicare Other | Admitting: Podiatry

## 2017-08-14 DIAGNOSIS — M79676 Pain in unspecified toe(s): Secondary | ICD-10-CM

## 2017-08-14 DIAGNOSIS — B351 Tinea unguium: Secondary | ICD-10-CM | POA: Diagnosis not present

## 2017-08-16 NOTE — Progress Notes (Signed)
Subjective: 83 y.o. returns the office today for painful, elongated, thickened toenails which she cannot trim herself. Denies any redness or drainage around the nails. She also has a callus to the bottom of her left foot but she states she does NOT want this trimmed. Denies any acute changes since last appointment and no new complaints today. Denies any systemic complaints such as fevers, chills, nausea, vomiting.   Objective: AAO 3, NAD DP/PT pulses palpable, CRT less than 3 seconds  Nails hypertrophic, dystrophic, elongated, brittle, discolored 10. There is tenderness overlying the nails 1-5 bilaterally. There is no surrounding erythema or drainage along the nail sites. Hyperkeratotic lesion submetatarsal foot. There does not appear to be any underlying ulceration or drainage but she will not limited trim the callus today. There is no surrounding erythema, ascending cellulitis. There is no fluctuance or crepitus or malodor.  Upon debridement there is no underlying ulceration, drainage or any signs of infection. There was macerated tissue from the corn remover pad. There is no signs of infection. No open lesions or pre-ulcerative lesions are identified. No other areas of tenderness bilateral lower extremities. No overlying edema, erythema, increased warmth. No pain with calf compression, swelling, warmth, erythema.  Assessment: Patient presents with symptomatic onychomycosis; porokeratosis  Plan: -Treatment options including alternatives, risks, complications were discussed -Nails sharply debrided 10 without complication/bleeding. -Offloading to the hyperkeratotic lesion. Pads were dispensed today. -Discussed daily foot inspection. If there are any changes, to call the office immediately.  -Follow-up in 3 months or sooner if any problems are to arise. In the meantime, encouraged to call the office with any questions, concerns, changes symptoms.  Celesta Gentile, DPM

## 2017-08-25 ENCOUNTER — Ambulatory Visit (INDEPENDENT_AMBULATORY_CARE_PROVIDER_SITE_OTHER): Payer: Medicare Other | Admitting: Nurse Practitioner

## 2017-08-25 ENCOUNTER — Encounter: Payer: Self-pay | Admitting: Nurse Practitioner

## 2017-08-25 VITALS — BP 116/82 | HR 71 | Ht 63.0 in | Wt 164.8 lb

## 2017-08-25 DIAGNOSIS — Z23 Encounter for immunization: Secondary | ICD-10-CM

## 2017-08-25 DIAGNOSIS — I495 Sick sinus syndrome: Secondary | ICD-10-CM | POA: Diagnosis not present

## 2017-08-25 DIAGNOSIS — I4821 Permanent atrial fibrillation: Secondary | ICD-10-CM

## 2017-08-25 DIAGNOSIS — I10 Essential (primary) hypertension: Secondary | ICD-10-CM

## 2017-08-25 DIAGNOSIS — I5042 Chronic combined systolic (congestive) and diastolic (congestive) heart failure: Secondary | ICD-10-CM

## 2017-08-25 DIAGNOSIS — I482 Chronic atrial fibrillation: Secondary | ICD-10-CM | POA: Diagnosis not present

## 2017-08-25 NOTE — Addendum Note (Signed)
Addended by: Gaetano Net on: 08/25/2017 03:59 PM   Modules accepted: Orders

## 2017-08-25 NOTE — Progress Notes (Addendum)
CARDIOLOGY OFFICE NOTE  Date:  08/25/2017    Jessica [name] Date of Birth: [birth year] Medical Record #[id number]  PCP:  Binnie Rail, MD  Cardiologist:  [name]   Chief Complaint  Patient presents with  . Congestive Heart Failure    One month check - seen for Dr. [name]    History of Present Illness: Jessica [name] is a 83 y.o. female who presents today for a follow up visit. Seen for Dr. [name].   She has a history of permanent a fib on Eliquis, CAD with stents in 2010 to the RCA, chronic diastolic HF, CKD, DM, HLD, SSS with MDT PPM in 2011. Last echo 02/24/17 EF had dropped to 30-35%, mild AR, mild to mod MR, TR mild to mod. PA pk pressure 46 mmHg. Nuclear stress testing was recommended to rule out ischemia - family declined and opted for more conservative approach. Last cath 2010 with stents.  Last seen back by Dr. [name] in July. Lasix was increased for a few days.   Saw Daune Perch NP back in August.   Saw Cecilie Kicks, NP in September. Remained short of breath and having increased weight.    Comes in today. Here with her daughter. "Chore" to get ready to come here today. Forgot her hearing aids. More short of breath and has more swelling today - it continues to come and go - daughter gives her extra lasix based on the weights. Not really getting too much salt. Denies chest pain. Asking for a flu shot today. Seems to be getting more deconditioned. Not walking as much and legs seem weaker. Daughter notes more confusion at times.   Past Medical History:  Diagnosis Date  . Breast cancer ([place])   . CAD (coronary artery disease)    a. s/p DESx2 to mid and distal RCA in 2010.  Marland Kitchen Cholelithiasis 09/12/2013   Lap Chole on 09/14/13   . Chronic diastolic CHF (congestive heart failure) ([place])    a. Echo 06/09/15: EF 55-60%, normal wall motion, mild AI, mild to moderate MR, mild LAE, mildly reduced RVSF, mild RAE, moderate TR, PASP 53 mmHg  . CKD (chronic kidney disease), stage  III ([place])   . Diabetes mellitus    NON INSULIN DEPENDENT  . DJD (degenerative joint disease)   . GERD (gastroesophageal reflux disease)   . Hemorrhoids   . History of diabetic neuropathy   . Hypercholesterolemia   . Hypertension   . Hypertensive heart disease   . Paroxysmal atrial fibrillation (HCC)    a. discovered on PPM interrogation 12/15, CHAD2VASC score is 6. b. decompensation with dCHF in 06/2015 possibly due to AF, s/p DCCV.  . SSS (sick sinus syndrome) ([place])    a. s/p Medtronic PPM by JA for SSS and syncope 9/11.    Past Surgical History:  Procedure Laterality Date  . CARDIAC CATHETERIZATION  07/05/2009   EF 55-60%  . CARDIOVERSION N/A 06/11/2015   Procedure: CARDIOVERSION;  Surgeon: [name], MD;  Location: [facility] ENDOSCOPY;  Service: Cardiovascular;  Laterality: N/A;  . CHOLECYSTECTOMY N/A 09/14/2013   Procedure: LAPAROSCOPIC CHOLECYSTECTOMY WITH INTRAOPERATIVE CHOLANGIOGRAM;  Surgeon: [name], MD;  Location: [facility];  Service: General;  Laterality: N/A;  . COLONOSCOPY    . ERCP N/A 09/13/2013   Procedure: ENDOSCOPIC RETROGRADE CHOLANGIOPANCREATOGRAPHY (ERCP);  Surgeon: [name], MD;  Location: [facility] ENDOSCOPY;  Service: Endoscopy;  Laterality: N/A;  . INSERT / REPLACE / REMOVE PACEMAKER  9/11  SSS and syncope, implanted by JA (MDT)  . MASTECTOMY  1992   BILATERAL WITH RECONSTRUCTION     Medications: Current Meds  Medication Sig  . apixaban (ELIQUIS) 5 MG TABS tablet Take 1 tablet (5 mg total) by mouth 2 (two) times daily.  Marland Kitchen b complex vitamins tablet Take 1 tablet by mouth daily.  . blood glucose meter kit and supplies KIT Dispense based on patient and insurance preference. Test sugars once daily and prn as directed. (FOR ICD-9 250.00, 250.01).  . Calcium Citrate-Vitamin D (CALCIUM CITRATE + D PO) Take 1 tablet by mouth daily with breakfast.  . carvedilol (COREG) 12.5 MG tablet Take 1 tablet (12.5 mg total) by mouth 2 (two) times daily.  Marland Kitchen ECHINACEA  PO Take 750 mg by mouth daily.  . fish oil-omega-3 fatty acids 1000 MG capsule Take 1 g by mouth every morning.   . furosemide (LASIX) 20 MG tablet Take 1 tablet (20 mg total) by mouth daily.  Marland Kitchen gabapentin (NEURONTIN) 100 MG capsule TAKE ONE CAPSULE BY MOUTH AT BEDTIME. TAKE ON ADDITION TO THE 300 MG CAP YOU ARE ALREADY TAKING AT NIGHT  . gabapentin (NEURONTIN) 300 MG capsule TAKE 1 CAPSULE BY MOUTH TWICE DAILY  . metFORMIN (GLUCOPHAGE) 500 MG tablet TAKE ONE TABLET BY MOUTH ONCE DAILY WITH BREAKFAST  . Misc. Devices (ROLLATOR ULTRA-LIGHT) MISC With seat.  Diabetic neuropathy, knee arthritis  . nitroGLYCERIN (NITROSTAT) 0.4 MG SL tablet Place 1 tablet (0.4 mg total) under the tongue every 5 (five) minutes as needed for chest pain. x3 doses as needed for chest pain  . rosuvastatin (CRESTOR) 10 MG tablet TAKE ONE TABLET BY MOUTH ONCE DAILY IN THE MORNING     Allergies: Allergies  Allergen Reactions  . Lyrica [Pregabalin] Swelling    Caused weight gain  . Amlodipine Swelling  . Sulfonamide Derivatives Rash    Social History: The patient  reports that she has never smoked. She has never used smokeless tobacco. She reports that she does not drink alcohol or use drugs.   Family History: The patient's family history includes Cancer in her father and mother.   Review of Systems: Please see the history of present illness.   Otherwise, the review of systems is positive for none.   All other systems are reviewed and negative.   Physical Exam: VS:  BP 116/82   Pulse 71   Ht 5' 3"  (1.6 m)   Wt 164 lb 12.8 oz (74.8 kg)   LMP  (LMP Unknown)   SpO2 92%   BMI 29.19 kg/m  .  BMI Body mass index is 29.19 kg/m.  Wt Readings from Last 3 Encounters:  08/25/17 164 lb 12.8 oz (74.8 kg)  07/24/17 162 lb (73.5 kg)  06/12/17 161 lb (73 kg)    General: Pleasant. Little hard of hearing. Elderly female who is alert and in no acute distress.  HEENT: Normal.  Neck: Supple, no JVD, carotid bruits, or  masses noted.  Cardiac: Fairly regular - presumed paced. No edema.  Respiratory:  Lungs are clear to auscultation bilaterally with normal work of breathing.  GI: Soft and nontender.  MS: No deformity or atrophy. Gait and ROM intact.  Skin: Warm and dry. Color is pale.   Neuro:  Strength and sensation are intact and no gross focal deficits noted.  Psych: Alert, appropriate and with normal affect.   LABORATORY DATA:  EKG:  EKG is not ordered today.  Lab Results  Component Value Date  WBC 7.8 04/02/2017   HGB 12.0 04/02/2017   HCT 35.5 04/02/2017   PLT 249 04/02/2017   GLUCOSE 98 08/04/2017   CHOL 101 06/09/2015   TRIG 52 06/09/2015   HDL 38 (L) 06/09/2015   LDLDIRECT 152.0 10/10/2013   LDLCALC 53 06/09/2015   ALT 13 03/27/2017   AST 15 03/27/2017   NA 146 (H) 08/04/2017   K 4.0 08/04/2017   CL 106 08/04/2017   CREATININE 1.09 (H) 08/04/2017   BUN 20 08/04/2017   CO2 28 08/04/2017   TSH 3.500 02/11/2017   INR 0.95 07/15/2010   HGBA1C 6.6 (H) 03/29/2017     BNP (last 3 results)  Recent Labs  09/18/16 1545 03/28/17 1312  BNP 283.0* 772.5*    ProBNP (last 3 results)  Recent Labs  03/27/17 1112 05/21/17 1233 06/12/17 0914  PROBNP 2,510* 9,390* 7,665*     Other Studies Reviewed Today:  Echo Study Conclusions 02/2017  - Left ventricle: The cavity size was normal. Wall thickness was   normal. Systolic function was moderately to severely reduced. The   estimated ejection fraction was in the range of 30% to 35%. - Aortic valve: Mildly calcified annulus. Mildly thickened, mildly   calcified leaflets. There was mild regurgitation. - Mitral valve: There was mild to moderate regurgitation. - Right ventricle: Systolic function was mildly to moderately   reduced. - Tricuspid valve: There was mild-moderate regurgitation. - Pulmonary arteries: Systolic pressure was moderately increased.   PA peak pressure: 46 mm Hg (S).  CARDIAC CATH FINDINGS 07/2009:   LV  showed good LV systolic function.  There was 3+ catheter-  induced MR.  EF of 55-60%.  Left main was patent proximally and had 15-  20% distal stenosis.  LAD has 15-20% proximal stenosis and 30% proximal  bifurcation stenosis with diagonal-1.  Diagonal-1 had 60% ostial  stenosis.  Ramus was very small, which was patent.  Left circumflex has  10-15% ostial stenosis and it tapers down in the AV groove after giving  off OM-2.  OM-1 is very, very small.  OM-2 was moderate size, which was  patent.  RCA has 10-15% proximal stenosis and 70-75% sequential mid  stenosis and 70-75% focal distal stenosis with TIMI grade 3 distal flow.   INTERVENTIONAL PROCEDURE:  Successful PTCA to distal RCA and proximal  RCA was done using 2.5 x 12-mm long [place] Voyager balloon for pre-  dilatation and then 2.5 x 15-mm long Xience V stent was deployed in  distal RCA at 11 atmospheric pressure.  Stent was post-dilated using  2.75 x 12-mm long [place] Voyager balloon going up to 18 atmospheric pressure  and then 3.5 x 28 mm long Xience V stent was deployed at 11 atmospheric  pressure in mid RCA.  Stent was post-dilated using 3.75 x 20 mm long   Voyager balloon going up to 18 and 20 atmospheric pressure.  Lesions  were dilated from 70-75% to 0% with excellent TIMI grade 3 distal flow  without evidence of dissection or distal embolization.  The patient  received weight-based Angiomax and 600 mg of Plavix during the  procedure.  The patient did not have any episodes of marked sinus  bradycardia or pauses during the procedure.  The patient tolerated the  procedure well.  There were no complications.  The patient was  transferred to recovery room in stable condition.    Allegra Lai. Terrence Dupont, M.D.   Assessment/Plan:  1.  Chronic combined diastolic and systolic HF - continues  to have intermittent spells of weight gain and swelling - using prn Lasix - daughter seems to have a very good understanding of how to manage diuretics.  Continues to favor conservative approach.   2. CM with EF 30-35% pt and daughter did not wish to have stress testing or cardiac cath. They favor conservative management.   3.  CKD - not on ACE/ARB  4.  HTN - BP is fine here today.   5.  Persistent AF - managed with PPM in place, rate control and anticoagulation.   6. Chronic anticoagulation with Eliquis - CBC today.   7. PPM with SSS - seeing EP next month  Current medicines are reviewed with the patient today.  The patient does not have concerns regarding medicines other than what has been noted above.  The following changes have been made:  See above.  Labs/ tests ordered today include:   No orders of the defined types were placed in this encounter.    Disposition:   FU with Dr. [name] and her team. Advised high dose flu vaccine - we did have this available and this was given today.   Patient is agreeable to this plan and will call if any problems develop in the interim.   SignedTruitt Merle, NP  08/25/2017 3:28 PM  [facility] [address] [place], [place]  [address] Phone: [phone] Fax: [phone]

## 2017-08-25 NOTE — Patient Instructions (Addendum)
We will be checking the following labs today - BMET and CBC   Medication Instructions:    Continue with your current medicines.   Extra dose of Lasix for the next 2 or 3 days - then continue to use extra as needed for weight gain.     Testing/Procedures To Be Arranged:  N/A  Follow-Up:   See Dr. [name] and Dr. [name] as planned.     Other Special Instructions:   N/A    If you need a refill on your cardiac medications before your next appointment, please call your pharmacy.   Call the [place] office at [phone] if you have any questions, problems or concerns.

## 2017-08-28 LAB — BASIC METABOLIC PANEL
BUN/Creatinine Ratio: 18 (ref 12–28)
BUN: 21 mg/dL (ref 8–27)
CO2: 24 mmol/L (ref 20–29)
Calcium: 9.7 mg/dL (ref 8.7–10.3)
Chloride: 109 mmol/L — ABNORMAL HIGH (ref 96–106)
Creatinine, Ser: 1.16 mg/dL — ABNORMAL HIGH (ref 0.57–1.00)
GFR calc Af Amer: 49 mL/min/{1.73_m2} — ABNORMAL LOW (ref >59)
GFR calc non Af Amer: 42 mL/min/{1.73_m2} — ABNORMAL LOW (ref >59)
Glucose: 114 mg/dL — ABNORMAL HIGH (ref 65–99)
Potassium: 4 mmol/L (ref 3.5–5.2)
Sodium: 146 mmol/L — ABNORMAL HIGH (ref 134–144)

## 2017-08-28 LAB — CBC
Hematocrit: 35.7 % (ref 34.0–46.6)
Hemoglobin: 12.1 g/dL (ref 11.1–15.9)
MCH: 29.9 pg (ref 26.6–33.0)
MCHC: 33.9 g/dL (ref 31.5–35.7)
MCV: 88 fL (ref 79–97)
Platelets: 219 10*3/uL (ref 150–379)
RBC: 4.05 x10E6/uL (ref 3.77–5.28)
RDW: 15.2 % (ref 12.3–15.4)
WBC: 6.3 10*3/uL (ref 3.4–10.8)

## 2017-09-02 ENCOUNTER — Ambulatory Visit (INDEPENDENT_AMBULATORY_CARE_PROVIDER_SITE_OTHER): Payer: Medicare Other | Admitting: *Deleted

## 2017-09-02 DIAGNOSIS — I495 Sick sinus syndrome: Secondary | ICD-10-CM | POA: Diagnosis not present

## 2017-09-04 ENCOUNTER — Encounter: Payer: Self-pay | Admitting: Cardiology

## 2017-09-04 NOTE — Progress Notes (Signed)
Remote pacemaker transmission.   

## 2017-09-07 LAB — CUP PACEART REMOTE DEVICE CHECK
Battery Impedance: 608 Ohm
Battery Remaining Longevity: 79 mo
Battery Voltage: 2.79 V
Brady Statistic AP VS Percent: 33 %
Brady Statistic AS VP Percent: 2 %
Date Time Interrogation Session: 20181031201457
Implantable Lead Implant Date: 20110912
Implantable Lead Location: 753859
Implantable Lead Model: 5076
Implantable Lead Model: 5092
Implantable Pulse Generator Implant Date: 20110912
Lead Channel Impedance Value: 460 Ohm
Lead Channel Impedance Value: 640 Ohm
Lead Channel Pacing Threshold Amplitude: 0.75 V
Lead Channel Pacing Threshold Pulse Width: 0.4 ms
Lead Channel Sensing Intrinsic Amplitude: 0.7 mV
Lead Channel Setting Pacing Amplitude: 2.5 V
MDC IDC LEAD IMPLANT DT: 20110912
MDC IDC LEAD LOCATION: 753860
MDC IDC MSMT LEADCHNL RV PACING THRESHOLD AMPLITUDE: 0.75 V
MDC IDC MSMT LEADCHNL RV PACING THRESHOLD PULSEWIDTH: 0.4 ms
MDC IDC MSMT LEADCHNL RV SENSING INTR AMPL: 5.6 mV
MDC IDC SET LEADCHNL RA PACING AMPLITUDE: 2 V
MDC IDC SET LEADCHNL RV PACING PULSEWIDTH: 0.4 ms
MDC IDC SET LEADCHNL RV SENSING SENSITIVITY: 2 mV
MDC IDC STAT BRADY AP VP PERCENT: 12 %
MDC IDC STAT BRADY AS VS PERCENT: 53 %

## 2017-09-16 ENCOUNTER — Encounter: Payer: Self-pay | Admitting: Internal Medicine

## 2017-09-16 ENCOUNTER — Ambulatory Visit (INDEPENDENT_AMBULATORY_CARE_PROVIDER_SITE_OTHER): Payer: Medicare Other | Admitting: Internal Medicine

## 2017-09-16 VITALS — BP 114/60 | HR 76 | Ht 63.0 in | Wt 166.4 lb

## 2017-09-16 DIAGNOSIS — I480 Paroxysmal atrial fibrillation: Secondary | ICD-10-CM

## 2017-09-16 DIAGNOSIS — I495 Sick sinus syndrome: Secondary | ICD-10-CM | POA: Diagnosis not present

## 2017-09-16 DIAGNOSIS — I4819 Other persistent atrial fibrillation: Secondary | ICD-10-CM

## 2017-09-16 DIAGNOSIS — I481 Persistent atrial fibrillation: Secondary | ICD-10-CM

## 2017-09-16 DIAGNOSIS — I10 Essential (primary) hypertension: Secondary | ICD-10-CM | POA: Diagnosis not present

## 2017-09-16 DIAGNOSIS — I5042 Chronic combined systolic (congestive) and diastolic (congestive) heart failure: Secondary | ICD-10-CM

## 2017-09-16 LAB — CUP PACEART INCLINIC DEVICE CHECK
Battery Remaining Longevity: 79 mo
Brady Statistic AP VP Percent: 12 %
Brady Statistic AP VS Percent: 32 %
Brady Statistic AS VS Percent: 54 %
Implantable Lead Implant Date: 20110912
Implantable Lead Location: 753860
Implantable Pulse Generator Implant Date: 20110912
Lead Channel Impedance Value: 655 Ohm
Lead Channel Pacing Threshold Amplitude: 0.625 V
Lead Channel Pacing Threshold Pulse Width: 0.4 ms
Lead Channel Pacing Threshold Pulse Width: 0.4 ms
Lead Channel Setting Pacing Amplitude: 2 V
Lead Channel Setting Sensing Sensitivity: 2 mV
MDC IDC LEAD IMPLANT DT: 20110912
MDC IDC LEAD LOCATION: 753859
MDC IDC MSMT BATTERY IMPEDANCE: 609 Ohm
MDC IDC MSMT BATTERY VOLTAGE: 2.79 V
MDC IDC MSMT LEADCHNL RA IMPEDANCE VALUE: 473 Ohm
MDC IDC MSMT LEADCHNL RA PACING THRESHOLD AMPLITUDE: 0.75 V
MDC IDC MSMT LEADCHNL RA PACING THRESHOLD PULSEWIDTH: 0.4 ms
MDC IDC MSMT LEADCHNL RA SENSING INTR AMPL: 0.7 mV
MDC IDC MSMT LEADCHNL RV PACING THRESHOLD AMPLITUDE: 0.75 V
MDC IDC MSMT LEADCHNL RV SENSING INTR AMPL: 4 mV
MDC IDC SESS DTM: 20181114164924
MDC IDC SET LEADCHNL RV PACING AMPLITUDE: 2.5 V
MDC IDC SET LEADCHNL RV PACING PULSEWIDTH: 0.4 ms
MDC IDC STAT BRADY AS VP PERCENT: 2 %

## 2017-09-16 NOTE — Progress Notes (Signed)
PCP: Binnie Rail, MD Primary Cardiologist:  Dr [name] Primary EP:  Dr Gabriela Eves is a 83 y.o. female who presents today for routine electrophysiology followup.  Since last being seen in our clinic, the patient reports doing reasonably well.  + occasional SOB.  Today, she denies symptoms of palpitations, chest pain, lower extremity edema, dizziness, presyncope, or syncope.  The patient is otherwise without complaint today.   Past Medical History:  Diagnosis Date  . Breast cancer ([place])   . CAD (coronary artery disease)    a. s/p DESx2 to mid and distal RCA in 2010.  Marland Kitchen Cholelithiasis 09/12/2013   Lap Chole on 09/14/13   . Chronic diastolic CHF (congestive heart failure) ([place])    a. Echo 06/09/15: EF 55-60%, normal wall motion, mild AI, mild to moderate MR, mild LAE, mildly reduced RVSF, mild RAE, moderate TR, PASP 53 mmHg  . CKD (chronic kidney disease), stage III ([place])   . Diabetes mellitus    NON INSULIN DEPENDENT  . DJD (degenerative joint disease)   . GERD (gastroesophageal reflux disease)   . Hemorrhoids   . History of diabetic neuropathy   . Hypercholesterolemia   . Hypertension   . Hypertensive heart disease   . Paroxysmal atrial fibrillation (HCC)    a. discovered on PPM interrogation 12/15, CHAD2VASC score is 6. b. decompensation with dCHF in 06/2015 possibly due to AF, s/p DCCV.  . SSS (sick sinus syndrome) ([place])    a. s/p Medtronic PPM by JA for SSS and syncope 9/11.   Past Surgical History:  Procedure Laterality Date  . CARDIAC CATHETERIZATION  07/05/2009   EF 55-60%  . COLONOSCOPY    . INSERT / REPLACE / REMOVE PACEMAKER  9/11   SSS and syncope, implanted by JA (MDT)  . MASTECTOMY  1992   BILATERAL WITH RECONSTRUCTION    ROS- all systems are reviewed and negative except as per HPI above  Current Outpatient Medications  Medication Sig Dispense Refill  . apixaban (ELIQUIS) 5 MG TABS tablet Take 1 tablet (5 mg total) by mouth 2 (two) times daily.  180 tablet 2  . b complex vitamins tablet Take 1 tablet by mouth daily.    . blood glucose meter kit and supplies KIT Dispense based on patient and insurance preference. Test sugars once daily and prn as directed. (FOR ICD-9 250.00, 250.01). 1 each 0  . Calcium Citrate-Vitamin D (CALCIUM CITRATE + D PO) Take 1 tablet by mouth daily with breakfast.    . carvedilol (COREG) 12.5 MG tablet Take 1 tablet (12.5 mg total) by mouth 2 (two) times daily. 60 tablet 0  . ECHINACEA PO Take 750 mg by mouth daily.    . fish oil-omega-3 fatty acids 1000 MG capsule Take 1 g by mouth every morning.     . furosemide (LASIX) 20 MG tablet Take 20 mg daily by mouth. MAY TAKE EXTRA 20 MG DAILY AS NEEDED FOR SWELLING    . gabapentin (NEURONTIN) 100 MG capsule TAKE ONE CAPSULE BY MOUTH AT BEDTIME. TAKE ON ADDITION TO THE 300 MG CAP YOU ARE ALREADY TAKING AT NIGHT 90 capsule 3  . gabapentin (NEURONTIN) 300 MG capsule TAKE 1 CAPSULE BY MOUTH TWICE DAILY 180 capsule 1  . metFORMIN (GLUCOPHAGE) 500 MG tablet TAKE ONE TABLET BY MOUTH ONCE DAILY WITH BREAKFAST 90 tablet 0  . Misc. Devices (ROLLATOR ULTRA-LIGHT) MISC With seat.  Diabetic neuropathy, knee arthritis 1 each 0  . nitroGLYCERIN (NITROSTAT)  0.4 MG SL tablet Place 1 tablet (0.4 mg total) under the tongue every 5 (five) minutes as needed for chest pain. x3 doses as needed for chest pain 25 tablet 0  . rosuvastatin (CRESTOR) 10 MG tablet TAKE ONE TABLET BY MOUTH ONCE DAILY IN THE MORNING 90 tablet 1   No current facility-administered medications for this visit.     Physical Exam: Vitals:   09/16/17 1553  BP: 114/60  Pulse: 76  SpO2: 93%  Weight: 166 lb 6.4 oz (75.5 kg)  Height: 5' 3"  (1.6 m)    GEN- The patient is elderly appearing, alert and oriented x 3 today.   Head- normocephalic, atraumatic Eyes-  Sclera clear, conjunctiva pink Ears- hearing intact Oropharynx- clear Lungs- Clear to ausculation bilaterally, normal work of breathing Chest- pacemaker  pocket is well healed Heart- irregular rate and rhythm, no murmurs, rubs or gallops, PMI not laterally displaced GI- soft, NT, ND, + BS Extremities- no clubbing, cyanosis, or edema  Pacemaker interrogation- reviewed in detail today,  See PACEART report   Assessment and Plan:  1. Symptomatic bradycardia  Normal pacemaker function See Pace Art report No changes today Reprogram VVIR on return  2. Longstanding persistent atrial fibrillation Has progressed since I saw last,  Appears to have been in afib for this past year by device interrogation.  On eliquis for stroke prevention  3. HTN Stable No change required today  4. Chronic combined systolic/ diastolic dysfunction euvolemic today Conservative approach is advised  [facility] Return to see EP NP every year Follow-up wth Dr [name] as scheduled  Thompson Grayer MD, [facility] 09/16/2017 4:07 PM

## 2017-09-16 NOTE — Patient Instructions (Addendum)
Medication Instructions:  Your physician recommends that you continue on your current medications as directed. Please refer to the Current Medication list given to you today.   Labwork: None ordered   Testing/Procedures: None ordered   Follow-Up: Your physician wants you to follow-up in: 3 months with Dr. [name] (please cancel 09/18/17 appt) and 12 months with Chanetta Marshall, NP.  You will receive a reminder letter in the mail two months in advance of appointment with Chanetta Marshall, NP. If you don't receive a letter, please call our office to schedule the follow-up appointment.  Remote monitoring is used to monitor your Pacemaker  from home. This monitoring reduces the number of office visits required to check your device to one time per year. It allows [place] to keep an eye on the functioning of your device to ensure it is working properly. You are scheduled for a device check from home on 12/02/17. You may send your transmission at any time that day. If you have a wireless device, the transmission will be sent automatically. After your physician reviews your transmission, you will receive a postcard with your next transmission date.     Any Other Special Instructions Will Be Listed Below (If Applicable).     If you need a refill on your cardiac medications before your next appointment, please call your pharmacy.

## 2017-09-18 ENCOUNTER — Ambulatory Visit: Payer: Medicare Other | Admitting: Cardiology

## 2017-10-12 DIAGNOSIS — I5042 Chronic combined systolic (congestive) and diastolic (congestive) heart failure: Secondary | ICD-10-CM | POA: Insufficient documentation

## 2017-10-12 DIAGNOSIS — I5043 Acute on chronic combined systolic (congestive) and diastolic (congestive) heart failure: Secondary | ICD-10-CM | POA: Insufficient documentation

## 2017-10-12 NOTE — Progress Notes (Signed)
Subjective:    Patient ID: Jessica [name], female    DOB: Oct 30, 1927, 83 y.o.   MRN: [id number]  HPI The patient is here for follow up.  She is here with her daughter, who provides the history.  Generalized weakness, confusion, shortness of breath: The past couple of weeks she has changed.  Her speech is weak.  Her daughter has not noticed any slurred speech or dysarthria.  She is sleeping a lot more.  She is leaning forward and her daughter thinks she does that to breath better.  When sleeping she is taking very shallow breaths and looks like she is barely breathing.  She is taking an extra lasix almost daily per Dr [name].  Her stomach feels tight.  There has been some increased confusion.  She is walking with the rollator and is slower than usual.  She is less active.  She is not dropping anything and is eating.  She denies any numbness or tingling.  There is no issue with swallowing.  Hypertension: She is taking her medication daily. She is compliant with a low sodium diet.  She denies chest pain, palpitations and regular headaches. She is not exercising regularly.  She does monitor her blood pressure at home irregularly and it is stable in the 130-140's at home.      Diabetes: She is taking her medication daily as prescribed. She is compliant with a diabetic diet. She is not exercising regularly. She monitors her sugars and they have been running 103 - 140.    Hyperlipidemia: She is taking her medication daily. She is compliant with a low fat/cholesterol diet. She is not exercising regularly.      Medications and allergies reviewed with patient and updated if appropriate.  Patient Active Problem List   Diagnosis Date Noted  . Chronic combined systolic and diastolic CHF (congestive heart failure) ([place]) 10/12/2017  . Hypotension 03/28/2017  . Acute kidney injury superimposed on chronic kidney disease ([place]) 03/28/2017  . Dehydration 03/27/2017  . Bilateral leg edema 04/29/2016  . AF  (paroxysmal atrial fibrillation) ([place]) 06/11/2015  . CKD (chronic kidney disease) stage 3, GFR 30-59 ml/min (HCC) 06/10/2015  . CAD (coronary artery disease) 06/09/2015  . Diabetic neuropathy ([place]) 07/06/2013  . SSS (sick sinus syndrome) ([place]) 02/10/2013  . Hypertension 07/30/2012  . Pacemaker-Medtronic 07/19/2012  . Benign hypertensive heart disease with heart failure ([place]) 01/28/2012  . Diabetes ([place]) 10/22/2010  . HYPERCHOLESTEROLEMIA 10/22/2010  . Angina pectoris ([place]) 10/22/2010  . DEGENERATIVE JOINT DISEASE 10/22/2010    Current Outpatient Medications on File Prior to Visit  Medication Sig Dispense Refill  . apixaban (ELIQUIS) 5 MG TABS tablet Take 1 tablet (5 mg total) by mouth 2 (two) times daily. 180 tablet 2  . b complex vitamins tablet Take 1 tablet by mouth daily.    . blood glucose meter kit and supplies KIT Dispense based on patient and insurance preference. Test sugars once daily and prn as directed. (FOR ICD-9 250.00, 250.01). 1 each 0  . Calcium Citrate-Vitamin D (CALCIUM CITRATE + D PO) Take 1 tablet by mouth daily with breakfast.    . carvedilol (COREG) 12.5 MG tablet Take 1 tablet (12.5 mg total) by mouth 2 (two) times daily. 60 tablet 0  . ECHINACEA PO Take 750 mg by mouth daily.    . fish oil-omega-3 fatty acids 1000 MG capsule Take 1 g by mouth every morning.     . furosemide (LASIX) 20 MG tablet Take 20 mg  daily by mouth. MAY TAKE EXTRA 20 MG DAILY AS NEEDED FOR SWELLING    . gabapentin (NEURONTIN) 100 MG capsule TAKE ONE CAPSULE BY MOUTH AT BEDTIME. TAKE ON ADDITION TO THE 300 MG CAP YOU ARE ALREADY TAKING AT NIGHT 90 capsule 3  . gabapentin (NEURONTIN) 300 MG capsule TAKE 1 CAPSULE BY MOUTH TWICE DAILY 180 capsule 1  . metFORMIN (GLUCOPHAGE) 500 MG tablet TAKE ONE TABLET BY MOUTH ONCE DAILY WITH BREAKFAST 90 tablet 0  . Misc. Devices (ROLLATOR ULTRA-LIGHT) MISC With seat.  Diabetic neuropathy, knee arthritis 1 each 0  . nitroGLYCERIN (NITROSTAT) 0.4 MG SL tablet  Place 1 tablet (0.4 mg total) under the tongue every 5 (five) minutes as needed for chest pain. x3 doses as needed for chest pain 25 tablet 0  . rosuvastatin (CRESTOR) 10 MG tablet TAKE ONE TABLET BY MOUTH ONCE DAILY IN THE MORNING 90 tablet 1   No current facility-administered medications on file prior to visit.     Past Medical History:  Diagnosis Date  . Breast cancer ([place])   . CAD (coronary artery disease)    a. s/p DESx2 to mid and distal RCA in 2010.  Marland Kitchen Cholelithiasis 09/12/2013   Lap Chole on 09/14/13   . Chronic diastolic CHF (congestive heart failure) ([place])    a. Echo 06/09/15: EF 55-60%, normal wall motion, mild AI, mild to moderate MR, mild LAE, mildly reduced RVSF, mild RAE, moderate TR, PASP 53 mmHg  . CKD (chronic kidney disease), stage III ([place])   . Diabetes mellitus    NON INSULIN DEPENDENT  . DJD (degenerative joint disease)   . GERD (gastroesophageal reflux disease)   . Hemorrhoids   . History of diabetic neuropathy   . Hypercholesterolemia   . Hypertension   . Hypertensive heart disease   . Paroxysmal atrial fibrillation (HCC)    a. discovered on PPM interrogation 12/15, CHAD2VASC score is 6. b. decompensation with dCHF in 06/2015 possibly due to AF, s/p DCCV.  . SSS (sick sinus syndrome) ([place])    a. s/p Medtronic PPM by JA for SSS and syncope 9/11.    Past Surgical History:  Procedure Laterality Date  . CARDIAC CATHETERIZATION  07/05/2009   EF 55-60%  . CARDIOVERSION N/A 06/11/2015   Procedure: CARDIOVERSION;  Surgeon: [name], MD;  Location: [facility] ENDOSCOPY;  Service: Cardiovascular;  Laterality: N/A;  . CHOLECYSTECTOMY N/A 09/14/2013   Procedure: LAPAROSCOPIC CHOLECYSTECTOMY WITH INTRAOPERATIVE CHOLANGIOGRAM;  Surgeon: [name], MD;  Location: [facility];  Service: General;  Laterality: N/A;  . COLONOSCOPY    . ERCP N/A 09/13/2013   Procedure: ENDOSCOPIC RETROGRADE CHOLANGIOPANCREATOGRAPHY (ERCP);  Surgeon: [name], MD;  Location: [facility]  ENDOSCOPY;  Service: Endoscopy;  Laterality: N/A;  . INSERT / REPLACE / REMOVE PACEMAKER  9/11   SSS and syncope, implanted by JA (MDT)  . MASTECTOMY  1992   BILATERAL WITH RECONSTRUCTION    Social History   Socioeconomic History  . Marital status: Widowed    Spouse name: None  . Number of children: None  . Years of education: None  . Highest education level: None  Social Needs  . Financial resource strain: None  . Food insecurity - worry: None  . Food insecurity - inability: None  . Transportation needs - medical: None  . Transportation needs - non-medical: None  Occupational History  . None  Tobacco Use  . Smoking status: Never Smoker  . Smokeless tobacco: Never Used  Substance and Sexual Activity  .  Alcohol use: No    Alcohol/week: 0.0 oz  . Drug use: No  . Sexual activity: None  Other Topics Concern  . None  Social History Narrative  . None    Family History  Problem Relation Age of Onset  . Cancer Mother   . Cancer Father     Review of Systems  Constitutional: Positive for appetite change (decreased) and fatigue. Negative for chills and fever.       Generalized weakness  Respiratory: Positive for shortness of breath. Negative for cough and wheezing.   Cardiovascular: Positive for leg swelling (chronic - unchanged). Negative for chest pain and palpitations.  Gastrointestinal: Positive for nausea (three days ago). Negative for abdominal pain, constipation and diarrhea.  Genitourinary: Negative for dysuria and hematuria.  Neurological: Positive for light-headedness (sometimes). Negative for weakness (no focal weakness), numbness and headaches.       Objective:   Vitals:   10/14/17 1547  BP: (!) 168/90  Pulse: 75  Resp: 16  SpO2: 97%   Wt Readings from Last 3 Encounters:  10/14/17 162 lb (73.5 kg)  09/16/17 166 lb 6.4 oz (75.5 kg)  08/25/17 164 lb 12.8 oz (74.8 kg)   Body mass index is 28.7 kg/m.   Physical Exam    Constitutional: Chronically  ill-appearing.  No distress.  HENT:  Head: Normocephalic and atraumatic.  Neck: Neck supple. No tracheal deviation present. No thyromegaly present.  No cervical lymphadenopathy Cardiovascular: Normal rate, regular rhythm and normal heart sounds.   No murmur heard. No carotid bruit .  2 + pitting  Edema - slightly worse per daughter Pulmonary/Chest: Effort normal. No respiratory distress. No has no wheezes. Bibasilar crackles.   Abdomen: Soft, nondistended, nontender Skin: Skin is warm and dry. Not diaphoretic.  Psychiatric: Normal mood and affect. Behavior is normal.      Assessment & Plan:    See Problem List for Assessment and Plan of chronic medical problems.

## 2017-10-12 NOTE — Patient Instructions (Addendum)
Have blood work and a chest xray today.  Test(s) ordered today. Your results will be released to [place] (or called to you) after review, usually within 72hours after test completion. If any changes need to be made, you will be notified at that same time.   Medications reviewed and updated.  Changes include taking 40 mg of lasix tomorrow.  We will call tomorrow with the results  Your prescription(s) have been submitted to your pharmacy. Please take as directed and contact our office if you believe you are having problem(s) with the medication(s).   Please followup in 6 months

## 2017-10-14 ENCOUNTER — Other Ambulatory Visit (INDEPENDENT_AMBULATORY_CARE_PROVIDER_SITE_OTHER): Payer: Medicare Other

## 2017-10-14 ENCOUNTER — Encounter: Payer: Self-pay | Admitting: Internal Medicine

## 2017-10-14 ENCOUNTER — Other Ambulatory Visit: Payer: Self-pay | Admitting: Internal Medicine

## 2017-10-14 ENCOUNTER — Ambulatory Visit (INDEPENDENT_AMBULATORY_CARE_PROVIDER_SITE_OTHER)
Admission: RE | Admit: 2017-10-14 | Discharge: 2017-10-14 | Disposition: A | Discharge status: Home / Self Care | Payer: Medicare Other | Source: Ambulatory Visit | Attending: Internal Medicine | Admitting: Internal Medicine

## 2017-10-14 ENCOUNTER — Ambulatory Visit: Payer: Medicare Other | Admitting: Internal Medicine

## 2017-10-14 VITALS — BP 168/90 | HR 75 | Resp 16 | Wt 162.0 lb

## 2017-10-14 DIAGNOSIS — E7800 Pure hypercholesterolemia, unspecified: Secondary | ICD-10-CM | POA: Diagnosis not present

## 2017-10-14 DIAGNOSIS — E1140 Type 2 diabetes mellitus with diabetic neuropathy, unspecified: Secondary | ICD-10-CM

## 2017-10-14 DIAGNOSIS — R410 Disorientation, unspecified: Secondary | ICD-10-CM | POA: Diagnosis not present

## 2017-10-14 DIAGNOSIS — R531 Weakness: Secondary | ICD-10-CM | POA: Diagnosis not present

## 2017-10-14 DIAGNOSIS — I5043 Acute on chronic combined systolic (congestive) and diastolic (congestive) heart failure: Secondary | ICD-10-CM

## 2017-10-14 DIAGNOSIS — I5042 Chronic combined systolic (congestive) and diastolic (congestive) heart failure: Secondary | ICD-10-CM

## 2017-10-14 DIAGNOSIS — I10 Essential (primary) hypertension: Secondary | ICD-10-CM

## 2017-10-14 DIAGNOSIS — I480 Paroxysmal atrial fibrillation: Secondary | ICD-10-CM | POA: Diagnosis not present

## 2017-10-14 LAB — URINALYSIS, ROUTINE W REFLEX MICROSCOPIC
Bilirubin Urine: NEGATIVE
KETONES UR: NEGATIVE
Nitrite: POSITIVE — AB
RBC / HPF: NONE SEEN (ref 0–?)
SPECIFIC GRAVITY, URINE: 1.01 (ref 1.000–1.030)
Total Protein, Urine: NEGATIVE
URINE GLUCOSE: NEGATIVE
Urobilinogen, UA: 0.2 (ref 0.0–1.0)
pH: 6 (ref 5.0–8.0)

## 2017-10-14 LAB — COMPREHENSIVE METABOLIC PANEL
ALT: 11 U/L (ref 0–35)
AST: 13 U/L (ref 0–37)
Albumin: 3.7 g/dL (ref 3.5–5.2)
Alkaline Phosphatase: 86 U/L (ref 39–117)
BUN: 24 mg/dL — ABNORMAL HIGH (ref 6–23)
CALCIUM: 9.7 mg/dL (ref 8.4–10.5)
CHLORIDE: 106 meq/L (ref 96–112)
CO2: 27 meq/L (ref 19–32)
Creatinine, Ser: 1.16 mg/dL (ref 0.40–1.20)
GFR: 46.8 mL/min — AB (ref >60.00)
GLUCOSE: 101 mg/dL — AB (ref 70–99)
Potassium: 3.3 mEq/L — ABNORMAL LOW (ref 3.5–5.1)
Sodium: 141 mEq/L (ref 135–145)
Total Bilirubin: 1 mg/dL (ref 0.2–1.2)
Total Protein: 6.4 g/dL (ref 6.0–8.3)

## 2017-10-14 LAB — CBC WITH DIFFERENTIAL/PLATELET
BASOS PCT: 1.1 % (ref 0.0–3.0)
Basophils Absolute: 0.1 10*3/uL (ref 0.0–0.1)
Eosinophils Absolute: 0.1 10*3/uL (ref 0.0–0.7)
Eosinophils Relative: 1.5 % (ref 0.0–5.0)
HEMATOCRIT: 35.9 % — AB (ref 36.0–46.0)
Hemoglobin: 12.1 g/dL (ref 12.0–15.0)
LYMPHS ABS: 2 10*3/uL (ref 0.7–4.0)
LYMPHS PCT: 27.1 % (ref 12.0–46.0)
MCHC: 33.9 g/dL (ref 30.0–36.0)
MCV: 87.6 fl (ref 78.0–100.0)
MONOS PCT: 6.6 % (ref 3.0–12.0)
Monocytes Absolute: 0.5 10*3/uL (ref 0.1–1.0)
NEUTROS ABS: 4.7 10*3/uL (ref 1.4–7.7)
NEUTROS PCT: 63.7 % (ref 43.0–77.0)
PLATELETS: 226 10*3/uL (ref 150.0–400.0)
RBC: 4.1 Mil/uL (ref 3.87–5.11)
RDW: 15.8 % — AB (ref 11.5–15.5)
WBC: 7.3 10*3/uL (ref 4.0–10.5)

## 2017-10-14 LAB — BRAIN NATRIURETIC PEPTIDE: PRO B NATRI PEPTIDE: 2662 pg/mL — AB (ref 0.0–100.0)

## 2017-10-14 LAB — HEMOGLOBIN A1C: HEMOGLOBIN A1C: 6.5 % (ref 4.6–6.5)

## 2017-10-14 MED ORDER — POTASSIUM CHLORIDE CRYS ER 20 MEQ PO TBCR: 20.0000 meq | EXTENDED_RELEASE_TABLET | Freq: Every day | ORAL | 3 refills | Status: DC

## 2017-10-14 MED ORDER — CIPROFLOXACIN HCL 250 MG PO TABS: 250.0000 mg | ORAL_TABLET | Freq: Two times a day (BID) | ORAL | 0 refills | Status: DC

## 2017-10-14 NOTE — Assessment & Plan Note (Signed)
Continue statin. 

## 2017-10-14 NOTE — Assessment & Plan Note (Signed)
Sugars well controlled at home We will check A1c May consider discontinuing metformin, but likely patient will have difficulty with her sugars being less controlled, but given her decreased kidney function and may need to stop the Metformin-we will need to monitor closely and will decide after today's blood work

## 2017-10-14 NOTE — Assessment & Plan Note (Signed)
?    Acute exacerbation Bibasilar crackles, increased bilateral lower extremity edema Will check chest x-ray, BNP, CMP 40 mg of Lasix tomorrow-we will likely need this for a few days, but we will call tomorrow with the results of her blood work and chest x-ray and advise further

## 2017-10-14 NOTE — Assessment & Plan Note (Signed)
Elevated here today, but has been stable at home and better controlled Continue to monitor at home We will not make any adjustments today May need close follow-up

## 2017-10-14 NOTE — Assessment & Plan Note (Signed)
Started on Eliquis last month by cardiology for more persistent atrial fibrillation Rate controlled and seems to be in sinus now We will check CBC

## 2017-10-14 NOTE — Assessment & Plan Note (Signed)
No focal weakness or focal neurological deficits Continue Eliquis given atrial fibrillation We will check urine, chest x-ray and blood work results

## 2017-10-14 NOTE — Assessment & Plan Note (Signed)
?    Due to CHF exacerbation or possible infection Chest x-ray, urinalysis/culture and blood work today Lasix 40 mg tomorrow morning-we will likely need higher dose for a few days, but will instruct further tomorrow If confusion does not improve her daughter will let me know Stroke unlikely given her being on Eliquis and no other focal neurological symptoms/deficits

## 2017-10-16 ENCOUNTER — Other Ambulatory Visit: Payer: Self-pay | Admitting: Internal Medicine

## 2017-10-16 ENCOUNTER — Telehealth: Payer: Self-pay | Admitting: *Deleted

## 2017-10-16 NOTE — Telephone Encounter (Signed)
Notified pt/daughter w/MD response../lmb 

## 2017-10-17 LAB — URINE CULTURE
MICRO NUMBER:: 81396893
SPECIMEN QUALITY:: ADEQUATE

## 2017-10-19 ENCOUNTER — Other Ambulatory Visit (INDEPENDENT_AMBULATORY_CARE_PROVIDER_SITE_OTHER): Payer: Medicare Other

## 2017-10-19 DIAGNOSIS — I5043 Acute on chronic combined systolic (congestive) and diastolic (congestive) heart failure: Secondary | ICD-10-CM

## 2017-10-19 LAB — COMPREHENSIVE METABOLIC PANEL
ALBUMIN: 3.7 g/dL (ref 3.5–5.2)
ALT: 11 U/L (ref 0–35)
AST: 14 U/L (ref 0–37)
Alkaline Phosphatase: 88 U/L (ref 39–117)
BUN: 19 mg/dL (ref 6–23)
CALCIUM: 9.7 mg/dL (ref 8.4–10.5)
CHLORIDE: 105 meq/L (ref 96–112)
CO2: 30 meq/L (ref 19–32)
Creatinine, Ser: 1.1 mg/dL (ref 0.40–1.20)
GFR: 49.75 mL/min — AB (ref >60.00)
Glucose, Bld: 105 mg/dL — ABNORMAL HIGH (ref 70–99)
POTASSIUM: 3.6 meq/L (ref 3.5–5.1)
Sodium: 140 mEq/L (ref 135–145)
Total Bilirubin: 0.9 mg/dL (ref 0.2–1.2)
Total Protein: 6.5 g/dL (ref 6.0–8.3)

## 2017-10-19 LAB — BRAIN NATRIURETIC PEPTIDE: Pro B Natriuretic peptide (BNP): 1542 pg/mL — ABNORMAL HIGH (ref 0.0–100.0)

## 2017-10-30 ENCOUNTER — Inpatient Hospital Stay (HOSPITAL_COMMUNITY)
Admission: EM | Admit: 2017-10-30 | Discharge: 2017-11-02 | DRG: 291 | Disposition: A | Discharge status: Home Health | Payer: Medicare Other | Attending: Internal Medicine | Admitting: Internal Medicine

## 2017-10-30 ENCOUNTER — Emergency Department (HOSPITAL_COMMUNITY): Payer: Medicare Other

## 2017-10-30 ENCOUNTER — Other Ambulatory Visit: Payer: Self-pay

## 2017-10-30 ENCOUNTER — Encounter (HOSPITAL_COMMUNITY): Payer: Self-pay

## 2017-10-30 DIAGNOSIS — N183 Chronic kidney disease, stage 3 unspecified: Secondary | ICD-10-CM

## 2017-10-30 DIAGNOSIS — J90 Pleural effusion, not elsewhere classified: Secondary | ICD-10-CM | POA: Diagnosis present

## 2017-10-30 DIAGNOSIS — H9190 Unspecified hearing loss, unspecified ear: Secondary | ICD-10-CM | POA: Diagnosis present

## 2017-10-30 DIAGNOSIS — I509 Heart failure, unspecified: Secondary | ICD-10-CM | POA: Diagnosis not present

## 2017-10-30 DIAGNOSIS — I130 Hypertensive heart and chronic kidney disease with heart failure and stage 1 through stage 4 chronic kidney disease, or unspecified chronic kidney disease: Secondary | ICD-10-CM | POA: Diagnosis not present

## 2017-10-30 DIAGNOSIS — I480 Paroxysmal atrial fibrillation: Secondary | ICD-10-CM | POA: Diagnosis not present

## 2017-10-30 DIAGNOSIS — I160 Hypertensive urgency: Secondary | ICD-10-CM | POA: Diagnosis present

## 2017-10-30 DIAGNOSIS — Z9049 Acquired absence of other specified parts of digestive tract: Secondary | ICD-10-CM

## 2017-10-30 DIAGNOSIS — I4821 Permanent atrial fibrillation: Secondary | ICD-10-CM

## 2017-10-30 DIAGNOSIS — Z7984 Long term (current) use of oral hypoglycemic drugs: Secondary | ICD-10-CM

## 2017-10-30 DIAGNOSIS — L8990 Pressure ulcer of unspecified site, unspecified stage: Secondary | ICD-10-CM

## 2017-10-30 DIAGNOSIS — I5043 Acute on chronic combined systolic (congestive) and diastolic (congestive) heart failure: Secondary | ICD-10-CM | POA: Diagnosis present

## 2017-10-30 DIAGNOSIS — N189 Chronic kidney disease, unspecified: Secondary | ICD-10-CM | POA: Diagnosis present

## 2017-10-30 DIAGNOSIS — Z79899 Other long term (current) drug therapy: Secondary | ICD-10-CM

## 2017-10-30 DIAGNOSIS — E1122 Type 2 diabetes mellitus with diabetic chronic kidney disease: Secondary | ICD-10-CM | POA: Diagnosis present

## 2017-10-30 DIAGNOSIS — N179 Acute kidney failure, unspecified: Secondary | ICD-10-CM | POA: Diagnosis present

## 2017-10-30 DIAGNOSIS — Z853 Personal history of malignant neoplasm of breast: Secondary | ICD-10-CM

## 2017-10-30 DIAGNOSIS — I10 Essential (primary) hypertension: Secondary | ICD-10-CM

## 2017-10-30 DIAGNOSIS — M1990 Unspecified osteoarthritis, unspecified site: Secondary | ICD-10-CM | POA: Diagnosis present

## 2017-10-30 DIAGNOSIS — E876 Hypokalemia: Secondary | ICD-10-CM | POA: Diagnosis present

## 2017-10-30 DIAGNOSIS — Z888 Allergy status to other drugs, medicaments and biological substances status: Secondary | ICD-10-CM

## 2017-10-30 DIAGNOSIS — I495 Sick sinus syndrome: Secondary | ICD-10-CM | POA: Diagnosis present

## 2017-10-30 DIAGNOSIS — I5023 Acute on chronic systolic (congestive) heart failure: Secondary | ICD-10-CM

## 2017-10-30 DIAGNOSIS — E1140 Type 2 diabetes mellitus with diabetic neuropathy, unspecified: Secondary | ICD-10-CM | POA: Diagnosis present

## 2017-10-30 DIAGNOSIS — I482 Chronic atrial fibrillation: Secondary | ICD-10-CM | POA: Diagnosis present

## 2017-10-30 DIAGNOSIS — Z882 Allergy status to sulfonamides status: Secondary | ICD-10-CM

## 2017-10-30 DIAGNOSIS — Z7901 Long term (current) use of anticoagulants: Secondary | ICD-10-CM

## 2017-10-30 DIAGNOSIS — Z9010 Acquired absence of unspecified breast and nipple: Secondary | ICD-10-CM

## 2017-10-30 DIAGNOSIS — E0842 Diabetes mellitus due to underlying condition with diabetic polyneuropathy: Secondary | ICD-10-CM

## 2017-10-30 DIAGNOSIS — K219 Gastro-esophageal reflux disease without esophagitis: Secondary | ICD-10-CM | POA: Diagnosis present

## 2017-10-30 DIAGNOSIS — Z950 Presence of cardiac pacemaker: Secondary | ICD-10-CM | POA: Diagnosis present

## 2017-10-30 DIAGNOSIS — I2510 Atherosclerotic heart disease of native coronary artery without angina pectoris: Secondary | ICD-10-CM | POA: Diagnosis present

## 2017-10-30 DIAGNOSIS — E7800 Pure hypercholesterolemia, unspecified: Secondary | ICD-10-CM | POA: Diagnosis present

## 2017-10-30 LAB — BASIC METABOLIC PANEL
ANION GAP: 10 (ref 5–15)
BUN: 18 mg/dL (ref 6–20)
CO2: 25 mmol/L (ref 22–32)
Calcium: 10.2 mg/dL (ref 8.9–10.3)
Chloride: 103 mmol/L (ref 101–111)
Creatinine, Ser: 1.21 mg/dL — ABNORMAL HIGH (ref 0.44–1.00)
GFR calc Af Amer: 45 mL/min — ABNORMAL LOW (ref >60)
GFR, EST NON AFRICAN AMERICAN: 39 mL/min — AB (ref >60)
Glucose, Bld: 107 mg/dL — ABNORMAL HIGH (ref 65–99)
POTASSIUM: 4.3 mmol/L (ref 3.5–5.1)
SODIUM: 138 mmol/L (ref 135–145)

## 2017-10-30 LAB — CBC
HEMATOCRIT: 39.1 % (ref 36.0–46.0)
HEMOGLOBIN: 12.7 g/dL (ref 12.0–15.0)
MCH: 28.9 pg (ref 26.0–34.0)
MCHC: 32.5 g/dL (ref 30.0–36.0)
MCV: 89.1 fL (ref 78.0–100.0)
Platelets: 261 10*3/uL (ref 150–400)
RBC: 4.39 MIL/uL (ref 3.87–5.11)
RDW: 16 % — AB (ref 11.5–15.5)
WBC: 6.7 10*3/uL (ref 4.0–10.5)

## 2017-10-30 LAB — I-STAT TROPONIN, ED: TROPONIN I, POC: 0.02 ng/mL (ref 0.00–0.08)

## 2017-10-30 MED ORDER — FUROSEMIDE 10 MG/ML IJ SOLN
40.0000 mg | INTRAMUSCULAR | Status: AC
  Administered 2017-10-31: 40 mg via INTRAVENOUS
  Filled 2017-10-30: qty 4

## 2017-10-30 NOTE — ED Provider Notes (Signed)
[facility] EMERGENCY DEPARTMENT Provider Note   CSN: [id number] Arrival date & time: 10/30/17  1650     History   Chief Complaint Chief Complaint  Patient presents with  . Shortness of Breath    HPI Jessica [name] is a 83 y.o. female.  Jessica [name] is a 83 y.o. Female who presents to the ED complaining of worsening shortness of breath over th past week.  Patient has a history of CHF and she believes this has worsened recently.  She reports that she feels short of breath with exertion.  She reports typically she has no problems with shortness of breath.  She is on 40 mg of IV Lasix daily.  She reports she is been compliant with her medications.  She also complains of bilateral leg swelling which is atypical for her.  She has a history of paroxysmal A. fib and is on Eliquis.  She denies current shortness of breath at rest.  She denies fevers, hemoptysis, abdominal pain, nausea, vomiting, chest pain, syncope or rashes.    The history is provided by the patient, medical records, a relative and a caregiver. No language interpreter was used.  Shortness of Breath  Associated symptoms include leg swelling. Pertinent negatives include no fever, no headaches, no sore throat, no neck pain, no wheezing, no chest pain, no vomiting, no abdominal pain and no rash.    Past Medical History:  Diagnosis Date  . Breast cancer ([place])   . CAD (coronary artery disease)    a. s/p DESx2 to mid and distal RCA in 2010.  Marland Kitchen Cholelithiasis 09/12/2013   Lap Chole on 09/14/13   . Chronic diastolic CHF (congestive heart failure) ([place])    a. Echo 06/09/15: EF 55-60%, normal wall motion, mild AI, mild to moderate MR, mild LAE, mildly reduced RVSF, mild RAE, moderate TR, PASP 53 mmHg  . CKD (chronic kidney disease), stage III ([place])   . Diabetes mellitus    NON INSULIN DEPENDENT  . DJD (degenerative joint disease)   . GERD (gastroesophageal reflux disease)   . Hemorrhoids   . History of  diabetic neuropathy   . Hypercholesterolemia   . Hypertension   . Hypertensive heart disease   . Paroxysmal atrial fibrillation (HCC)    a. discovered on PPM interrogation 12/15, CHAD2VASC score is 6. b. decompensation with dCHF in 06/2015 possibly due to AF, s/p DCCV.  . SSS (sick sinus syndrome) ([place])    a. s/p Medtronic PPM by JA for SSS and syncope 9/11.    Patient Active Problem List   Diagnosis Date Noted  . Confusion 10/14/2017  . Generalized weakness 10/14/2017  . Chronic combined systolic and diastolic CHF (congestive heart failure) ([place]) 10/12/2017  . Hypotension 03/28/2017  . Acute kidney injury superimposed on chronic kidney disease ([place]) 03/28/2017  . Dehydration 03/27/2017  . Bilateral leg edema 04/29/2016  . AF (paroxysmal atrial fibrillation) ([place]) 06/11/2015  . CKD (chronic kidney disease) stage 3, GFR 30-59 ml/min (HCC) 06/10/2015  . CAD (coronary artery disease) 06/09/2015  . Diabetic neuropathy ([place]) 07/06/2013  . SSS (sick sinus syndrome) ([place]) 02/10/2013  . Hypertension 07/30/2012  . Pacemaker-Medtronic 07/19/2012  . Benign hypertensive heart disease with heart failure ([place]) 01/28/2012  . Diabetes ([place]) 10/22/2010  . HYPERCHOLESTEROLEMIA 10/22/2010  . Angina pectoris ([place]) 10/22/2010  . DEGENERATIVE JOINT DISEASE 10/22/2010    Past Surgical History:  Procedure Laterality Date  . CARDIAC CATHETERIZATION  07/05/2009   EF 55-60%  . CARDIOVERSION N/A  06/11/2015   Procedure: CARDIOVERSION;  Surgeon: [name], MD;  Location: [facility] ENDOSCOPY;  Service: Cardiovascular;  Laterality: N/A;  . CHOLECYSTECTOMY N/A 09/14/2013   Procedure: LAPAROSCOPIC CHOLECYSTECTOMY WITH INTRAOPERATIVE CHOLANGIOGRAM;  Surgeon: [name], MD;  Location: [facility];  Service: General;  Laterality: N/A;  . COLONOSCOPY    . ERCP N/A 09/13/2013   Procedure: ENDOSCOPIC RETROGRADE CHOLANGIOPANCREATOGRAPHY (ERCP);  Surgeon: [name], MD;  Location: [facility] ENDOSCOPY;  Service:  Endoscopy;  Laterality: N/A;  . INSERT / REPLACE / REMOVE PACEMAKER  9/11   SSS and syncope, implanted by JA (MDT)  . MASTECTOMY  1992   BILATERAL WITH RECONSTRUCTION    OB History    No data available       Home Medications    Prior to Admission medications   Medication Sig Start Date End Date Taking? Authorizing Provider  apixaban (ELIQUIS) 5 MG TABS tablet Take 1 tablet (5 mg total) by mouth 2 (two) times daily. 05/21/17  Yes [name], MD  b complex vitamins tablet Take 1 tablet by mouth daily.   Yes [provider]  Calcium Citrate-Vitamin D (CALCIUM CITRATE + D PO) Take 1 tablet by mouth daily with breakfast.   Yes [provider]  carvedilol (COREG) 12.5 MG tablet Take 1 tablet (12.5 mg total) by mouth 2 (two) times daily. 03/31/17  Yes Theodis Blaze, MD  docusate sodium (COLACE) 100 MG capsule Take 100 mg by mouth at bedtime.   Yes [provider]  ECHINACEA PO Take 750 mg by mouth daily.   Yes [provider]  fish oil-omega-3 fatty acids 1000 MG capsule Take 1 g by mouth every morning.    Yes [provider]  furosemide (LASIX) 20 MG tablet Take 40 mg by mouth daily.    Yes [provider]  gabapentin (NEURONTIN) 100 MG capsule TAKE ONE CAPSULE BY MOUTH AT BEDTIME. TAKE ON ADDITION TO THE 300 MG CAP YOU ARE ALREADY TAKING AT NIGHT Patient taking differently: TAKE 300MG BY MOUTH IN THE MORNING AND 400MG AT NIGHT 05/07/17  Yes Burns, Claudina Lick, MD  nitroGLYCERIN (NITROSTAT) 0.4 MG SL tablet Place 1 tablet (0.4 mg total) under the tongue every 5 (five) minutes as needed for chest pain. x3 doses as needed for chest pain 10/21/16  Yes Burns, Claudina Lick, MD  potassium chloride SA (K-DUR,KLOR-CON) 20 MEQ tablet Take 1 tablet (20 mEq total) by mouth daily. 10/14/17  Yes Burns, Claudina Lick, MD  rosuvastatin (CRESTOR) 10 MG tablet TAKE ONE TABLET BY MOUTH ONCE DAILY IN THE MORNING 07/13/17  Yes Burns, Claudina Lick, MD  blood glucose meter kit  and supplies KIT Dispense based on patient and insurance preference. Test sugars once daily and prn as directed. (FOR ICD-9 250.00, 250.01). 10/21/16   Binnie Rail, MD  ciprofloxacin (CIPRO) 250 MG tablet Take 1 tablet (250 mg total) by mouth 2 (two) times daily. Patient not taking: Reported on 10/30/2017 10/14/17   Binnie Rail, MD  gabapentin (NEURONTIN) 300 MG capsule TAKE 1 CAPSULE BY MOUTH TWICE DAILY Patient not taking: Reported on 10/30/2017 04/28/17   Binnie Rail, MD  metFORMIN (GLUCOPHAGE) 500 MG tablet TAKE ONE TABLET BY MOUTH ONCE DAILY WITH BREAKFAST Patient not taking: Reported on 10/30/2017 07/14/17   Binnie Rail, MD  Misc. Devices (ROLLATOR ULTRA-LIGHT) MISC With seat.  Diabetic neuropathy, knee arthritis 10/21/16   Binnie Rail, MD    Family History Family History  Problem  Relation Age of Onset  . Cancer Mother   . Cancer Father     Social History Social History   Tobacco Use  . Smoking status: Never Smoker  . Smokeless tobacco: Never Used  Substance Use Topics  . Alcohol use: No    Alcohol/week: 0.0 oz  . Drug use: No     Allergies   Lyrica [pregabalin]; Amlodipine; and Sulfonamide derivatives   Review of Systems Review of Systems  Constitutional: Negative for chills and fever.  HENT: Negative for congestion and sore throat.   Eyes: Negative for visual disturbance.  Respiratory: Positive for shortness of breath. Negative for wheezing.   Cardiovascular: Positive for leg swelling. Negative for chest pain and palpitations.  Gastrointestinal: Negative for abdominal pain, diarrhea, nausea and vomiting.  Genitourinary: Negative for dysuria.  Musculoskeletal: Negative for back pain and neck pain.  Skin: Negative for rash.  Neurological: Negative for headaches.     Physical Exam Updated Vital Signs BP (!) 167/73 (BP Location: Left Arm)   Pulse 68   Temp 97.9 F (36.6 C) (Oral)   Resp 16   Ht 5' 3" (1.6 m)   Wt 71.7 kg (158 lb)   LMP  (LMP  Unknown)   SpO2 95%   BMI 27.99 kg/m   Physical Exam  Constitutional: She appears well-developed and well-nourished.  Non-toxic appearance. She does not appear ill. No distress.  HENT:  Head: Normocephalic and atraumatic.  Mouth/Throat: Oropharynx is clear and moist.  Eyes: Conjunctivae are normal. Pupils are equal, round, and reactive to light. Right eye exhibits no discharge. Left eye exhibits no discharge.  Neck: Neck supple.  Cardiovascular: Normal rate, normal heart sounds and intact distal pulses. Exam reveals no gallop and no friction rub.  No murmur heard. Irregular irregular rhythm.  A. fib on the monitor.  Bilateral dorsalis pedis and radial pulses are intact.  Pulmonary/Chest: Effort normal. No respiratory distress. She has no wheezes.  Crackles diffusely.  No increased work of breathing.  Oxygen saturation 94% on room air.  Symmetric chest expansion bilaterally.  Abdominal: Soft. There is no tenderness.  Musculoskeletal:       Right lower leg: She exhibits edema. She exhibits no tenderness.       Left lower leg: She exhibits edema. She exhibits no tenderness.  Bilateral pitting lower extremity edema.  No calf tenderness.  Lymphadenopathy:    She has no cervical adenopathy.  Neurological: She is alert. Coordination normal.  Skin: Skin is warm and dry. Capillary refill takes less than 2 seconds. No rash noted. She is not diaphoretic. No erythema. No pallor.  Psychiatric: She has a normal mood and affect. Her behavior is normal.  Nursing note and vitals reviewed.    ED Treatments / Results  Labs (all labs ordered are listed, but only abnormal results are displayed) Labs Reviewed  BASIC METABOLIC PANEL - Abnormal; Notable for the following components:      Result Value   Glucose, Bld 107 (*)    Creatinine, Ser 1.21 (*)    GFR calc non Af Amer 39 (*)    GFR calc Af Amer 45 (*)    All other components within normal limits  CBC - Abnormal; Notable for the following  components:   RDW 16.0 (*)    All other components within normal limits  HEPATIC FUNCTION PANEL  BRAIN NATRIURETIC PEPTIDE  I-STAT TROPONIN, ED    EKG  EKG Interpretation  Date/Time:  Friday October 30 2017 16:55:30 EST Ventricular  Rate:  88 PR Interval:    QRS Duration: 90 QT Interval:  384 QTC Calculation: 464 R Axis:   85 Text Interpretation:  Atrial fibrillation with occasional ventricular-paced complexes Low voltage QRS Septal infarct , age undetermined ST & T wave abnormality, consider inferior ischemia ST & T wave abnormality, consider anterolateral ischemia Abnormal ECG T wave inversions in inferior leads that is new compared to previous Confirmed by Pryor Curia [phone]) on 10/30/2017 11:12:15 PM       Radiology Dg Chest 2 View  Result Date: 10/30/2017 CLINICAL DATA:  Shortness of breath for 1 week. EXAM: CHEST  2 VIEW COMPARISON:  10/14/2017 FINDINGS: The heart is enlarged but stable. There is tortuosity and calcification of the thoracic aorta. The pacer wires are stable. No acute pulmonary findings. Chronic bronchitic changes. Stable surgical changes involving the right chest wall and remote right humeral head and neck fractures. IMPRESSION: Stable cardiac enlargement. Chronic lung changes.  No acute pulmonary findings. Electronically Signed   By: Marijo Sanes M.D.   On: 10/30/2017 18:17    Procedures Procedures (including critical care time)  Medications Ordered in ED Medications  furosemide (LASIX) injection 40 mg (not administered)     Initial Impression / Assessment and Plan / ED Course  I have reviewed the triage vital signs and the nursing notes.  Pertinent labs & imaging results that were available during my care of the patient were reviewed by me and considered in my medical decision making (see chart for details).    This is a 83 y.o. Female who presents to the ED complaining of worsening shortness of breath over th past week.  Patient has a history  of CHF and she believes this has worsened recently.  She reports that she feels short of breath with exertion.  She reports typically she has no problems with shortness of breath.  She is on 40 mg of IV Lasix daily.  She reports she is been compliant with her medications.  She also complains of bilateral leg swelling which is atypical for her.  She has a history of paroxysmal A. fib and is on Eliquis. On exam the patient is afebrile nontoxic-appearing.  She has crackles bilaterally.  No increased work of breathing.  Oxygen saturation 94% on room air.  Bilateral lower extremity pitting edema. Troponin is not elevated.  BMP shows mildly elevated creatinine from her baseline of 1.21.  CBC shows no leukocytosis.  BMP pending.  Chest x-ray is stable.  Clinically  Patient is fluid overloaded.  We will provide 40 of IV Lasix and plan for admission.  Patient and family agree with plan for admission.   I consulted with Dr. Shanon Brow who accepted the patient for admission.   This patient was discussed with and evaluated by Dr. Leonides Schanz who agrees with assessment and plan.   Final Clinical Impressions(s) / ED Diagnoses   Final diagnoses:  Acute on chronic systolic congestive heart failure (HCC)  CKD (chronic kidney disease) stage 3, GFR 30-59 ml/min [facility])    ED Discharge Orders    None       Waynetta Pean, PA-C 10/30/17 2340    Ward, Delice Bison, DO 10/30/17 2341

## 2017-10-30 NOTE — H&P (Signed)
History and Physical    Jessica [name] BOF:[id number] DOB: Aug 18, 1927 DOA: 10/30/2017  PCP: Binnie Rail, MD  Patient coming from:  home  Chief Complaint:  Sob, swelling  HPI: Jessica [name] is a 83 y.o. female with medical history significant of chf EF 35% per echo in April, CAD, CKD, HTN, pafib comes in with one week of worsening le edema and doe.  No chest pain.  Pt has been having issues with her heart failure for a year.  Christmas was this last week and she has not been watching her salt or sugar intake as closely as she normally does.  She is compliant with her home meds.  Denies fevers.  No cough.  No supplemental oxygen at home, her oxygen sats are normal here at rest.  Pt referred for admission for chf exacerbation.  She lives with her dtr.  Review of Systems: As per HPI otherwise 10 point review of systems negative.   Past Medical History:  Diagnosis Date  . Breast cancer ([place])   . CAD (coronary artery disease)    a. s/p DESx2 to mid and distal RCA in 2010.  Marland Kitchen Cholelithiasis 09/12/2013   Lap Chole on 09/14/13   . Chronic diastolic CHF (congestive heart failure) ([place])    a. Echo 06/09/15: EF 55-60%, normal wall motion, mild AI, mild to moderate MR, mild LAE, mildly reduced RVSF, mild RAE, moderate TR, PASP 53 mmHg  . CKD (chronic kidney disease), stage III ([place])   . Diabetes mellitus    NON INSULIN DEPENDENT  . DJD (degenerative joint disease)   . GERD (gastroesophageal reflux disease)   . Hemorrhoids   . History of diabetic neuropathy   . Hypercholesterolemia   . Hypertension   . Hypertensive heart disease   . Paroxysmal atrial fibrillation (HCC)    a. discovered on PPM interrogation 12/15, CHAD2VASC score is 6. b. decompensation with dCHF in 06/2015 possibly due to AF, s/p DCCV.  . SSS (sick sinus syndrome) ([place])    a. s/p Medtronic PPM by JA for SSS and syncope 9/11.    Past Surgical History:  Procedure Laterality Date  . CARDIAC CATHETERIZATION  07/05/2009   EF  55-60%  . CARDIOVERSION N/A 06/11/2015   Procedure: CARDIOVERSION;  Surgeon: [name], MD;  Location[facility] ENDOSCOPY;  Service: Cardiovascular;  Laterality: N/A;  . CHOLECYSTECTOMY N/A 09/14/2013   Procedure: LAPAROSCOPIC CHOLECYSTECTOMY WITH INTRAOPERATIVE CHOLANGIOGRAM;  Surgeon: [name], MD;  Location: [facility];  Service: General;  Laterality: N/A;  . COLONOSCOPY    . ERCP N/A 09/13/2013   Procedure: ENDOSCOPIC RETROGRADE CHOLANGIOPANCREATOGRAPHY (ERCP);  Surgeon: [name], MD;  Location: [facility] ENDOSCOPY;  Service: Endoscopy;  Laterality: N/A;  . INSERT / REPLACE / REMOVE PACEMAKER  9/11   SSS and syncope, implanted by JA (MDT)  . MASTECTOMY  1992   BILATERAL WITH RECONSTRUCTION     reports that  has never smoked. she has never used smokeless tobacco. She reports that she does not drink alcohol or use drugs.  Allergies  Allergen Reactions  . Lyrica [Pregabalin] Swelling    Caused weight gain  . Amlodipine Swelling  . Sulfonamide Derivatives Rash    Family History  Problem Relation Age of Onset  . Cancer Mother   . Cancer Father     Prior to Admission medications   Medication Sig Start Date End Date Taking? Authorizing Provider  apixaban (ELIQUIS) 5 MG TABS tablet Take 1 tablet (5 mg total) by  mouth 2 (two) times daily. 05/21/17  Yes [name], MD  b complex vitamins tablet Take 1 tablet by mouth daily.   Yes [provider]  Calcium Citrate-Vitamin D (CALCIUM CITRATE + D PO) Take 1 tablet by mouth daily with breakfast.   Yes [provider]  carvedilol (COREG) 12.5 MG tablet Take 1 tablet (12.5 mg total) by mouth 2 (two) times daily. 03/31/17  Yes Theodis Blaze, MD  docusate sodium (COLACE) 100 MG capsule Take 100 mg by mouth at bedtime.   Yes [provider]  ECHINACEA PO Take 750 mg by mouth daily.   Yes [provider]  fish oil-omega-3 fatty acids 1000 MG capsule Take 1 g by mouth every morning.    Yes [provider]  furosemide (LASIX) 20 MG tablet Take 40 mg by mouth daily.    Yes [provider]  gabapentin (NEURONTIN) 100 MG capsule TAKE ONE CAPSULE BY MOUTH AT BEDTIME. TAKE ON ADDITION TO THE 300 MG CAP YOU ARE ALREADY TAKING AT NIGHT Patient taking differently: TAKE 300MG BY MOUTH IN THE MORNING AND 400MG AT NIGHT 05/07/17  Yes Burns, Claudina Lick, MD  nitroGLYCERIN (NITROSTAT) 0.4 MG SL tablet Place 1 tablet (0.4 mg total) under the tongue every 5 (five) minutes as needed for chest pain. x3 doses as needed for chest pain 10/21/16  Yes Burns, Claudina Lick, MD  potassium chloride SA (K-DUR,KLOR-CON) 20 MEQ tablet Take 1 tablet (20 mEq total) by mouth daily. 10/14/17  Yes Burns, Claudina Lick, MD  rosuvastatin (CRESTOR) 10 MG tablet TAKE ONE TABLET BY MOUTH ONCE DAILY IN THE MORNING 07/13/17  Yes Burns, Claudina Lick, MD  blood glucose meter kit and supplies KIT Dispense based on patient and insurance preference. Test sugars once daily and prn as directed. (FOR ICD-9 250.00, 250.01). 10/21/16   Binnie Rail, MD  ciprofloxacin (CIPRO) 250 MG tablet Take 1 tablet (250 mg total) by mouth 2 (two) times daily. Patient not taking: Reported on 10/30/2017 10/14/17   Binnie Rail, MD  gabapentin (NEURONTIN) 300 MG capsule TAKE 1 CAPSULE BY MOUTH TWICE DAILY Patient not taking: Reported on 10/30/2017 04/28/17   Binnie Rail, MD  metFORMIN (GLUCOPHAGE) 500 MG tablet TAKE ONE TABLET BY MOUTH ONCE DAILY WITH BREAKFAST Patient not taking: Reported on 10/30/2017 07/14/17   Binnie Rail, MD  Misc. Devices (ROLLATOR ULTRA-LIGHT) MISC With seat.  Diabetic neuropathy, knee arthritis 10/21/16   Binnie Rail, MD    Physical Exam: Vitals:   10/30/17 1704 10/30/17 1706 10/30/17 2021  BP: (!) 153/86  (!) 167/73  Pulse: 76  68  Resp: 16  16  Temp: (!) 97.5 F (36.4 C)  97.9 F (36.6 C)  TempSrc: Oral  Oral  SpO2: 93%  95%  Weight:  71.7 kg (158 lb)   Height:  5' 3"  (1.6 m)       Constitutional: NAD, calm,  comfortable, pleasantly hard of hearing Vitals:   10/30/17 1704 10/30/17 1706 10/30/17 2021  BP: (!) 153/86  (!) 167/73  Pulse: 76  68  Resp: 16  16  Temp: (!) 97.5 F (36.4 C)  97.9 F (36.6 C)  TempSrc: Oral  Oral  SpO2: 93%  95%  Weight:  71.7 kg (158 lb)   Height:  5' 3"  (1.6 m)    Eyes: PERRL, lids and conjunctivae normal ENMT: Mucous membranes are moist. Posterior pharynx clear of any exudate or lesions.Normal dentition.  Neck: normal, supple,  no masses, no thyromegaly Respiratory: clear to auscultation bilaterally, no wheezing, no crackles. Normal respiratory effort. No accessory muscle use.  Cardiovascular: Regular rate and rhythm, no murmurs / rubs / gallops. 2+ extremity edema. 2+ pedal pulses. No carotid bruits.  Abdomen: no tenderness, no masses palpated. No hepatosplenomegaly. Bowel sounds positive.  Musculoskeletal: no clubbing / cyanosis. No joint deformity upper and lower extremities. Good ROM, no contractures. Normal muscle tone.  Skin: no rashes, lesions, ulcers. No induration Neurologic: CN 2-12 grossly intact. Sensation intact, DTR normal. Strength 5/5 in all 4.  Psychiatric: Normal judgment and insight. Alert and oriented x 3. Normal mood.    Labs on Admission: I have personally reviewed following labs and imaging studies  CBC: Recent Labs  Lab 10/30/17 1717  WBC 6.7  HGB 12.7  HCT 39.1  MCV 89.1  PLT 017   Basic Metabolic Panel: Recent Labs  Lab 10/30/17 1717  NA 138  K 4.3  CL 103  CO2 25  GLUCOSE 107*  BUN 18  CREATININE 1.21*  CALCIUM 10.2   GFR: Estimated Creatinine Clearance: 30.5 mL/min (A) (by C-G formula based on SCr of 1.21 mg/dL (H)). Liver Function Tests: No results for input(s): AST, ALT, ALKPHOS, BILITOT, PROT, ALBUMIN in the last 168 hours. No results for input(s): LIPASE, AMYLASE in the last 168 hours. No results for input(s): AMMONIA in the last 168 hours. Coagulation Profile: No results for input(s): INR, PROTIME in the  last 168 hours. Cardiac Enzymes: No results for input(s): CKTOTAL, CKMB, CKMBINDEX, TROPONINI in the last 168 hours. BNP (last 3 results) Recent Labs    06/12/17 0914 10/14/17 1641 10/19/17 1509  PROBNP 7,665* 2,662.0* 1,542.0*   HbA1C: No results for input(s): HGBA1C in the last 72 hours. CBG: No results for input(s): GLUCAP in the last 168 hours. Lipid Profile: No results for input(s): CHOL, HDL, LDLCALC, TRIG, CHOLHDL, LDLDIRECT in the last 72 hours. Thyroid Function Tests: No results for input(s): TSH, T4TOTAL, FREET4, T3FREE, THYROIDAB in the last 72 hours. Anemia Panel: No results for input(s): VITAMINB12, FOLATE, FERRITIN, TIBC, IRON, RETICCTPCT in the last 72 hours. Urine analysis:    Component Value Date/Time   COLORURINE YELLOW 10/14/2017 1641   APPEARANCEUR Cloudy (A) 10/14/2017 1641   LABSPEC 1.010 10/14/2017 1641   PHURINE 6.0 10/14/2017 1641   GLUCOSEU NEGATIVE 10/14/2017 1641   HGBUR TRACE-INTACT (A) 10/14/2017 1641   BILIRUBINUR NEGATIVE 10/14/2017 1641   KETONESUR NEGATIVE 10/14/2017 1641   PROTEINUR NEGATIVE 03/28/2017 1443   UROBILINOGEN 0.2 10/14/2017 1641   NITRITE POSITIVE (A) 10/14/2017 1641   LEUKOCYTESUR SMALL (A) 10/14/2017 1641   Sepsis Labs: !!!!!!!!!!!!!!!!!!!!!!!!!!!!!!!!!!!!!!!!!!!! @LABRCNTIP (procalcitonin:4,lacticidven:4) )No results found for this or any previous visit (from the past 240 hour(s)).   Radiological Exams on Admission: Dg Chest 2 View  Result Date: 10/30/2017 CLINICAL DATA:  Shortness of breath for 1 week. EXAM: CHEST  2 VIEW COMPARISON:  10/14/2017 FINDINGS: The heart is enlarged but stable. There is tortuosity and calcification of the thoracic aorta. The pacer wires are stable. No acute pulmonary findings. Chronic bronchitic changes. Stable surgical changes involving the right chest wall and remote right humeral head and neck fractures. IMPRESSION: Stable cardiac enlargement. Chronic lung changes.  No acute pulmonary  findings. Electronically Signed   By: Marijo Sanes M.D.   On: 10/30/2017 18:17    EKG: Independently reviewed.  Afib, twi diffuse Old chart reviewed cxr reviewed no edema or infiltrate Case discussed with edp   Assessment/Plan 83 yo female with acute  on chronic chf exacerbation  Principal Problem:   CHF exacerbation ([place])- combined systolic and diastolic last EF 15%.  chf pathway.  Increase lasix to 59m iv q 12 hours as renal function allows.  Monitor cr closely.  Oxygen sats normal.  Normally on 411mlasix po daily.  Daily wts, I/os.  No chest pain.  Will not repeat echo.  Medically  Manage.  Aspirin.  Active Problems:   Pacemaker-Medtronic- noted   Hypertension- cont home meds   CAD (coronary artery disease)- noted   CKD (chronic kidney disease) stage 3, GFR 30-59 ml/min (HCC)- stable, monitor daily   AF (paroxysmal atrial fibrillation) ([place] cont eloquis   DM -ssi   DVT prophylaxis: eloquis Code Status:  full Family Communication: dtr Disposition Plan:  Per day team Consults called:  none Admission status:  observation Pt seen before midnight  Jeriyah Granlund A MD [facility]  If 7PM-7AM, please contact night-coverage [email or web address] Password [email or web address]12/28/2018, 11:55 PM

## 2017-10-30 NOTE — ED Triage Notes (Signed)
Per family, pt is coming from home with weakness and SOB for a few days. Pt has a hx of CHF and reports having trouble with it for over a year. Bilateral leg swelling noted.

## 2017-10-31 ENCOUNTER — Encounter (HOSPITAL_COMMUNITY): Payer: Self-pay | Admitting: *Deleted

## 2017-10-31 ENCOUNTER — Other Ambulatory Visit: Payer: Self-pay

## 2017-10-31 DIAGNOSIS — N183 Chronic kidney disease, stage 3 unspecified: Secondary | ICD-10-CM

## 2017-10-31 DIAGNOSIS — I5043 Acute on chronic combined systolic (congestive) and diastolic (congestive) heart failure: Secondary | ICD-10-CM | POA: Diagnosis not present

## 2017-10-31 DIAGNOSIS — I10 Essential (primary) hypertension: Secondary | ICD-10-CM | POA: Diagnosis not present

## 2017-10-31 DIAGNOSIS — Z950 Presence of cardiac pacemaker: Secondary | ICD-10-CM | POA: Diagnosis not present

## 2017-10-31 DIAGNOSIS — I482 Chronic atrial fibrillation: Secondary | ICD-10-CM | POA: Diagnosis not present

## 2017-10-31 DIAGNOSIS — L8990 Pressure ulcer of unspecified site, unspecified stage: Secondary | ICD-10-CM

## 2017-10-31 DIAGNOSIS — I495 Sick sinus syndrome: Secondary | ICD-10-CM

## 2017-10-31 DIAGNOSIS — I4821 Permanent atrial fibrillation: Secondary | ICD-10-CM

## 2017-10-31 LAB — HEPATIC FUNCTION PANEL
ALT: 20 U/L (ref 14–54)
AST: 26 U/L (ref 15–41)
Albumin: 3.5 g/dL (ref 3.5–5.0)
Alkaline Phosphatase: 115 U/L (ref 38–126)
BILIRUBIN INDIRECT: 0.8 mg/dL (ref 0.3–0.9)
Bilirubin, Direct: 0.3 mg/dL (ref 0.1–0.5)
TOTAL PROTEIN: 6.9 g/dL (ref 6.5–8.1)
Total Bilirubin: 1.1 mg/dL (ref 0.3–1.2)

## 2017-10-31 LAB — BASIC METABOLIC PANEL
ANION GAP: 12 (ref 5–15)
BUN: 16 mg/dL (ref 6–20)
CHLORIDE: 104 mmol/L (ref 101–111)
CO2: 23 mmol/L (ref 22–32)
CREATININE: 1.08 mg/dL — AB (ref 0.44–1.00)
Calcium: 9.8 mg/dL (ref 8.9–10.3)
GFR calc non Af Amer: 44 mL/min — ABNORMAL LOW (ref >60)
GFR, EST AFRICAN AMERICAN: 52 mL/min — AB (ref >60)
Glucose, Bld: 123 mg/dL — ABNORMAL HIGH (ref 65–99)
POTASSIUM: 3.3 mmol/L — AB (ref 3.5–5.1)
SODIUM: 139 mmol/L (ref 135–145)

## 2017-10-31 LAB — GLUCOSE, CAPILLARY
GLUCOSE-CAPILLARY: 214 mg/dL — AB (ref 65–99)
Glucose-Capillary: 105 mg/dL — ABNORMAL HIGH (ref 65–99)
Glucose-Capillary: 112 mg/dL — ABNORMAL HIGH (ref 65–99)

## 2017-10-31 LAB — BRAIN NATRIURETIC PEPTIDE: B Natriuretic Peptide: 3496.2 pg/mL — ABNORMAL HIGH (ref 0.0–100.0)

## 2017-10-31 MED ORDER — ACETAMINOPHEN 325 MG PO TABS: 650.0000 mg | ORAL_TABLET | ORAL | Status: DC | PRN

## 2017-10-31 MED ORDER — ONDANSETRON HCL 4 MG/2ML IJ SOLN: 4.0000 mg | Freq: Four times a day (QID) | INTRAMUSCULAR | Status: DC | PRN

## 2017-10-31 MED ORDER — POTASSIUM CHLORIDE CRYS ER 20 MEQ PO TBCR
20.0000 meq | EXTENDED_RELEASE_TABLET | Freq: Every day | ORAL | Status: DC
  Administered 2017-10-31 – 2017-11-02 (×3): 20 meq via ORAL
  Filled 2017-10-31 (×3): qty 1

## 2017-10-31 MED ORDER — INSULIN ASPART 100 UNIT/ML ~~LOC~~ SOLN
0.0000 [IU] | Freq: Three times a day (TID) | SUBCUTANEOUS | Status: DC
  Administered 2017-10-31: 3 [IU] via SUBCUTANEOUS
  Administered 2017-11-02 (×2): 1 [IU] via SUBCUTANEOUS

## 2017-10-31 MED ORDER — CARVEDILOL 12.5 MG PO TABS
12.5000 mg | ORAL_TABLET | Freq: Two times a day (BID) | ORAL | Status: DC
  Administered 2017-10-31 – 2017-11-02 (×5): 12.5 mg via ORAL
  Filled 2017-10-31 (×5): qty 1

## 2017-10-31 MED ORDER — B COMPLEX-C PO TABS
1.0000 | ORAL_TABLET | Freq: Every day | ORAL | Status: DC
  Administered 2017-10-31 – 2017-11-02 (×3): 1 via ORAL
  Filled 2017-10-31 (×3): qty 1

## 2017-10-31 MED ORDER — SODIUM CHLORIDE 0.9% FLUSH: 3.0000 mL | INTRAVENOUS | Status: DC | PRN

## 2017-10-31 MED ORDER — APIXABAN 5 MG PO TABS
5.0000 mg | ORAL_TABLET | Freq: Two times a day (BID) | ORAL | Status: DC
  Administered 2017-10-31 – 2017-11-02 (×6): 5 mg via ORAL
  Filled 2017-10-31 (×6): qty 1

## 2017-10-31 MED ORDER — SODIUM CHLORIDE 0.9% FLUSH
3.0000 mL | Freq: Two times a day (BID) | INTRAVENOUS | Status: DC
  Administered 2017-10-31 – 2017-11-02 (×6): 3 mL via INTRAVENOUS

## 2017-10-31 MED ORDER — SODIUM CHLORIDE 0.9 % IV SOLN: 250.0000 mL | INTRAVENOUS | Status: DC | PRN

## 2017-10-31 MED ORDER — ASPIRIN EC 81 MG PO TBEC
81.0000 mg | DELAYED_RELEASE_TABLET | Freq: Every day | ORAL | Status: DC
  Administered 2017-10-31 – 2017-11-02 (×3): 81 mg via ORAL
  Filled 2017-10-31 (×3): qty 1

## 2017-10-31 MED ORDER — FUROSEMIDE 10 MG/ML IJ SOLN
40.0000 mg | Freq: Two times a day (BID) | INTRAMUSCULAR | Status: DC
  Administered 2017-10-31 – 2017-11-01 (×3): 40 mg via INTRAVENOUS
  Filled 2017-10-31 (×3): qty 4

## 2017-10-31 MED ORDER — OMEGA-3-ACID ETHYL ESTERS 1 G PO CAPS
1.0000 g | ORAL_CAPSULE | Freq: Every morning | ORAL | Status: DC
  Administered 2017-10-31 – 2017-11-02 (×3): 1 g via ORAL
  Filled 2017-10-31 (×3): qty 1

## 2017-10-31 MED ORDER — DOCUSATE SODIUM 100 MG PO CAPS
100.0000 mg | ORAL_CAPSULE | Freq: Every day | ORAL | Status: DC
  Administered 2017-10-31 – 2017-11-01 (×2): 100 mg via ORAL
  Filled 2017-10-31 (×2): qty 1

## 2017-10-31 NOTE — ED Notes (Signed)
[phone] [name] is there daughter and pt lives with her

## 2017-10-31 NOTE — Progress Notes (Signed)
Pt has no order for cardiac monitoring order, has pacemaker. MD on call has been notified via page. No new orders were received. Will report off to the day shift nurse for follow up. Will continue monitoring.

## 2017-10-31 NOTE — Progress Notes (Signed)
PROGRESS NOTE  Jessica [name] WCB:[id number] DOB: 1927/09/05 DOA: 10/30/2017 PCP: Binnie Rail, MD  Brief History:  83 year old female with a history of systolic and diastolic CHF, CAD, hypertension, CKD stage III, persistent atrial fibrillation on apixaban, SSS presenting with 1 week history of increasing lower extremity edema and shortness of breath.  The patient has also had some intermittent PND.  She states that she endorses compliance with her medications, but she seems to have poor insight regarding her medications upon further questioning.  The patient is also complaining of increasing abdominal bloating and girth over the past week.  She denies any fevers, chills, chest pain, nausea, vomiting, diarrhea, abdominal pain.  Upon presentation, this patient was noted to have BNP 3496.  EKG showed atrial fibrillation with ventricular paced complexes and nonspecific ST changes.  Chest x-ray showed a questionable left pleural effusion and vascular congestion without frank pulmonary edema.  Assessment/Plan: Acute on chronic systolic and diastolic CHF -daily weights -accurate I/O's -fluid restrict -02/24/2017 echo EF 30-35%, mild to moderate MR/TR, PAS P 46 -Echo -continue IV Lasix 40 mg IV twice daily -NEG 1.1 L for the admission, but question accuracy -NEG 4 Lbs for the admission  CKD stage III  -baseline creatinine 1.0-1.3 -A.m. BMP -Monitor with diuresis  Permanent atrial fibrillation -Rate controlled -Continue apixaban  -continue carvedilol  Diabetes mellitus type 2 -10/14/2017 hemoglobin A1c 6.5 -NovoLog sliding scale -Holding metformin  Symptomatic bradycardia/sick sinus syndrome -Status post permanent pacemaker  Coronary artery disease -No chest pain presently -continue ASA -continue crestor     Disposition Plan:   Home in 2-3 days  Family Communication:   No Family at bedside  Consultants:  none  Code Status:  FULL   DVT Prophylaxis:  apixaban   Procedures: As Listed in Progress Note Above  Antibiotics: None    Subjective: Overall, patient is breathing better but she still has dyspnea with moderate exertion.  She denies any fevers, chills, chest pain, nausea, vomiting, diarrhea, abdominal pain.  No dysuria hematuria.  No rashes.  Objective: Vitals:   10/31/17 0215 10/31/17 0342 10/31/17 0824 10/31/17 1134  BP: (!) 154/79 (!) 165/89 (!) 175/75 (!) 149/72  Pulse: 65 69 65 64  Resp: (!) 26 18 16 18   Temp:  98 F (36.7 C) 98.5 F (36.9 C) 97.7 F (36.5 C)  TempSrc:  Oral Oral Oral  SpO2: 95% 98% 93% 97%  Weight:  70.3 kg (154 lb 15.7 oz)    Height:  5\' 3"  (1.6 m)      Intake/Output Summary (Last 24 hours) at 10/31/2017 1319 Last data filed at 10/31/2017 1316 Gross per 24 hour  Intake 243 ml  Output 2200 ml  Net -1957 ml   Weight change:  Exam:   General:  Pt is alert, follows commands appropriately, not in acute distress  HEENT: No icterus, No thrush, No neck mass, [place]/AT  Cardiovascular: IRRR, S1/S2, no rubs, no gallops  Respiratory: Bibasilar rales.  No wheezing.  Good air movement.  Abdomen: Soft/+BS, non tender, non distended, no guarding  Extremities: 2 + LE edema, No lymphangitis, No petechiae, No rashes, no synovitis   Data Reviewed: I have personally reviewed following labs and imaging studies Basic Metabolic Panel: Recent Labs  Lab 10/30/17 1717 10/31/17 0535  NA 138 139  K 4.3 3.3*  CL 103 104  CO2 25 23  GLUCOSE 107* 123*  BUN 18 16  CREATININE 1.21* 1.08*  CALCIUM 10.2 9.8   Liver Function Tests: Recent Labs  Lab 10/30/17 2259  AST 26  ALT 20  ALKPHOS 115  BILITOT 1.1  PROT 6.9  ALBUMIN 3.5   No results for input(s): LIPASE, AMYLASE in the last 168 hours. No results for input(s): AMMONIA in the last 168 hours. Coagulation Profile: No results for input(s): INR, PROTIME in the last 168 hours. CBC: Recent Labs  Lab 10/30/17 1717  WBC 6.7  HGB 12.7  HCT  39.1  MCV 89.1  PLT 261   Cardiac Enzymes: No results for input(s): CKTOTAL, CKMB, CKMBINDEX, TROPONINI in the last 168 hours. BNP: Invalid input(s): POCBNP CBG: Recent Labs  Lab 10/31/17 0804 10/31/17 1304  GLUCAP 105* 112*   HbA1C: No results for input(s): HGBA1C in the last 72 hours. Urine analysis:    Component Value Date/Time   COLORURINE YELLOW 10/14/2017 1641   APPEARANCEUR Cloudy (A) 10/14/2017 1641   LABSPEC 1.010 10/14/2017 1641   PHURINE 6.0 10/14/2017 1641   GLUCOSEU NEGATIVE 10/14/2017 1641   HGBUR TRACE-INTACT (A) 10/14/2017 1641   BILIRUBINUR NEGATIVE 10/14/2017 1641   KETONESUR NEGATIVE 10/14/2017 1641   PROTEINUR NEGATIVE 03/28/2017 1443   UROBILINOGEN 0.2 10/14/2017 1641   NITRITE POSITIVE (A) 10/14/2017 1641   LEUKOCYTESUR SMALL (A) 10/14/2017 1641   Sepsis Labs: @LABRCNTIP (procalcitonin:4,lacticidven:4) )No results found for this or any previous visit (from the past 240 hour(s)).   Scheduled Meds: . apixaban  5 mg Oral BID  . aspirin EC  81 mg Oral Daily  . B-complex with vitamin C  1 tablet Oral Daily  . carvedilol  12.5 mg Oral BID WC  . docusate sodium  100 mg Oral QHS  . furosemide  40 mg Intravenous Q12H  . insulin aspart  0-9 Units Subcutaneous TID WC  . omega-3 acid ethyl esters  1 g Oral q morning - 10a  . potassium chloride SA  20 mEq Oral Daily  . sodium chloride flush  3 mL Intravenous Q12H   Continuous Infusions: . sodium chloride      Procedures/Studies: Dg Chest 2 View  Result Date: 10/30/2017 CLINICAL DATA:  Shortness of breath for 1 week. EXAM: CHEST  2 VIEW COMPARISON:  10/14/2017 FINDINGS: The heart is enlarged but stable. There is tortuosity and calcification of the thoracic aorta. The pacer wires are stable. No acute pulmonary findings. Chronic bronchitic changes. Stable surgical changes involving the right chest wall and remote right humeral head and neck fractures. IMPRESSION: Stable cardiac enlargement. Chronic lung  changes.  No acute pulmonary findings. Electronically Signed   By: Marijo Sanes M.D.   On: 10/30/2017 18:17   Dg Chest 2 View  Result Date: 10/14/2017 CLINICAL DATA:  Confusion x 7 days. Hx of HTN, DM, CHF, pacemaker, CAD, breast ca. EXAM: CHEST  2 VIEW COMPARISON:  03/28/2017 FINDINGS: LEFT-sided pacemaker overlies normal cardiac silhouette. Small LEFT effusion. The lungs are hyperinflated. No effusion, infiltrate pneumothorax. Atherosclerotic calcification of the aorta. IMPRESSION: Hyperinflated lungs.  No acute cardiopulmonary findings. Electronically Signed   By: Suzy Bouchard M.D.   On: 10/14/2017 17:12    Orson Eva, DO  [facility] Pager [phone]  If 7PM-7AM, please contact night-coverage [email or web address] Password [email or web address] 10/31/2017, 1:19 PM   LOS: 0 days

## 2017-11-01 ENCOUNTER — Observation Stay (HOSPITAL_COMMUNITY): Payer: Medicare Other

## 2017-11-01 DIAGNOSIS — Z79899 Other long term (current) drug therapy: Secondary | ICD-10-CM | POA: Diagnosis not present

## 2017-11-01 DIAGNOSIS — I10 Essential (primary) hypertension: Secondary | ICD-10-CM | POA: Diagnosis not present

## 2017-11-01 DIAGNOSIS — I480 Paroxysmal atrial fibrillation: Secondary | ICD-10-CM | POA: Diagnosis present

## 2017-11-01 DIAGNOSIS — Z853 Personal history of malignant neoplasm of breast: Secondary | ICD-10-CM | POA: Diagnosis not present

## 2017-11-01 DIAGNOSIS — Z9010 Acquired absence of unspecified breast and nipple: Secondary | ICD-10-CM | POA: Diagnosis not present

## 2017-11-01 DIAGNOSIS — M1990 Unspecified osteoarthritis, unspecified site: Secondary | ICD-10-CM | POA: Diagnosis present

## 2017-11-01 DIAGNOSIS — E7800 Pure hypercholesterolemia, unspecified: Secondary | ICD-10-CM | POA: Diagnosis present

## 2017-11-01 DIAGNOSIS — Z888 Allergy status to other drugs, medicaments and biological substances status: Secondary | ICD-10-CM | POA: Diagnosis not present

## 2017-11-01 DIAGNOSIS — I130 Hypertensive heart and chronic kidney disease with heart failure and stage 1 through stage 4 chronic kidney disease, or unspecified chronic kidney disease: Secondary | ICD-10-CM | POA: Diagnosis present

## 2017-11-01 DIAGNOSIS — J90 Pleural effusion, not elsewhere classified: Secondary | ICD-10-CM | POA: Diagnosis present

## 2017-11-01 DIAGNOSIS — E1140 Type 2 diabetes mellitus with diabetic neuropathy, unspecified: Secondary | ICD-10-CM | POA: Diagnosis present

## 2017-11-01 DIAGNOSIS — K219 Gastro-esophageal reflux disease without esophagitis: Secondary | ICD-10-CM | POA: Diagnosis present

## 2017-11-01 DIAGNOSIS — I2510 Atherosclerotic heart disease of native coronary artery without angina pectoris: Secondary | ICD-10-CM | POA: Diagnosis present

## 2017-11-01 DIAGNOSIS — E876 Hypokalemia: Secondary | ICD-10-CM | POA: Diagnosis present

## 2017-11-01 DIAGNOSIS — H9190 Unspecified hearing loss, unspecified ear: Secondary | ICD-10-CM | POA: Diagnosis present

## 2017-11-01 DIAGNOSIS — I5043 Acute on chronic combined systolic (congestive) and diastolic (congestive) heart failure: Secondary | ICD-10-CM | POA: Diagnosis present

## 2017-11-01 DIAGNOSIS — Z7984 Long term (current) use of oral hypoglycemic drugs: Secondary | ICD-10-CM | POA: Diagnosis not present

## 2017-11-01 DIAGNOSIS — Z882 Allergy status to sulfonamides status: Secondary | ICD-10-CM | POA: Diagnosis not present

## 2017-11-01 DIAGNOSIS — Z7901 Long term (current) use of anticoagulants: Secondary | ICD-10-CM | POA: Diagnosis not present

## 2017-11-01 DIAGNOSIS — N183 Chronic kidney disease, stage 3 (moderate): Secondary | ICD-10-CM | POA: Diagnosis present

## 2017-11-01 DIAGNOSIS — Z950 Presence of cardiac pacemaker: Secondary | ICD-10-CM | POA: Diagnosis not present

## 2017-11-01 DIAGNOSIS — Z9049 Acquired absence of other specified parts of digestive tract: Secondary | ICD-10-CM | POA: Diagnosis not present

## 2017-11-01 DIAGNOSIS — I495 Sick sinus syndrome: Secondary | ICD-10-CM | POA: Diagnosis present

## 2017-11-01 DIAGNOSIS — E1122 Type 2 diabetes mellitus with diabetic chronic kidney disease: Secondary | ICD-10-CM | POA: Diagnosis present

## 2017-11-01 DIAGNOSIS — I482 Chronic atrial fibrillation: Secondary | ICD-10-CM | POA: Diagnosis present

## 2017-11-01 LAB — BASIC METABOLIC PANEL
ANION GAP: 11 (ref 5–15)
BUN: 14 mg/dL (ref 6–20)
CALCIUM: 9.5 mg/dL (ref 8.9–10.3)
CO2: 24 mmol/L (ref 22–32)
Chloride: 106 mmol/L (ref 101–111)
Creatinine, Ser: 1.05 mg/dL — ABNORMAL HIGH (ref 0.44–1.00)
GFR, EST AFRICAN AMERICAN: 53 mL/min — AB (ref >60)
GFR, EST NON AFRICAN AMERICAN: 46 mL/min — AB (ref >60)
GLUCOSE: 124 mg/dL — AB (ref 65–99)
Potassium: 3.2 mmol/L — ABNORMAL LOW (ref 3.5–5.1)
SODIUM: 141 mmol/L (ref 135–145)

## 2017-11-01 LAB — GLUCOSE, CAPILLARY
GLUCOSE-CAPILLARY: 111 mg/dL — AB (ref 65–99)
GLUCOSE-CAPILLARY: 286 mg/dL — AB (ref 65–99)
Glucose-Capillary: 118 mg/dL — ABNORMAL HIGH (ref 65–99)
Glucose-Capillary: 187 mg/dL — ABNORMAL HIGH (ref 65–99)

## 2017-11-01 LAB — ECHOCARDIOGRAM COMPLETE
AVLVOTPG: 2 mmHg
Ao-asc: 30 cm
CHL CUP STROKE VOLUME: 34 mL
EWDT: 190 ms
FS: 17 % — AB (ref 28–44)
Height: 63 in
IVS/LV PW RATIO, ED: 0.89
LA ID, A-P, ES: 40 mm
LA diam end sys: 40 mm
LA vol A4C: 51.8 ml
LA vol index: 32.2 mL/m2
LADIAMINDEX: 2.33 cm/m2
LAVOL: 55.3 mL
LDCA: 2.84 cm2
LV SIMPSON'S DISK: 43
LV dias vol: 79 mL (ref 46–106)
LVDIAVOLIN: 46 mL/m2
LVOT SV: 37 mL
LVOT VTI: 13.1 cm
LVOT diameter: 19 mm
LVOT peak vel: 76.4 cm/s
LVSYSVOL: 45 mL — AB (ref 14–42)
LVSYSVOLIN: 26 mL/m2
MV Dec: 190
MV Peak grad: 4 mmHg
MV pk A vel: 34.3 m/s
MVPKEVEL: 104 m/s
P 1/2 time: 791 ms
PW: 15.3 mm — AB (ref 0.6–1.1)
RV TAPSE: 6.51 mm
RV sys press: 47 mmHg
Reg peak vel: 281 cm/s
TR max vel: 281 cm/s
WEIGHTICAEL: 2419.2 [oz_av]

## 2017-11-01 LAB — MAGNESIUM: Magnesium: 1.8 mg/dL (ref 1.7–2.4)

## 2017-11-01 MED ORDER — POTASSIUM CHLORIDE CRYS ER 20 MEQ PO TBCR
40.0000 meq | EXTENDED_RELEASE_TABLET | Freq: Once | ORAL | Status: AC
  Administered 2017-11-01: 40 meq via ORAL
  Filled 2017-11-01: qty 2

## 2017-11-01 MED ORDER — MAGNESIUM SULFATE 2 GM/50ML IV SOLN
2.0000 g | Freq: Once | INTRAVENOUS | Status: AC
  Administered 2017-11-01: 2 g via INTRAVENOUS
  Filled 2017-11-01: qty 50

## 2017-11-01 MED ORDER — FUROSEMIDE 40 MG PO TABS
40.0000 mg | ORAL_TABLET | Freq: Every day | ORAL | Status: DC
  Administered 2017-11-01 – 2017-11-02 (×2): 40 mg via ORAL
  Filled 2017-11-01 (×2): qty 1

## 2017-11-01 NOTE — Progress Notes (Signed)
  Echocardiogram 2D Echocardiogram has been performed.  Jessica [name] 11/01/2017, 2:51 PM

## 2017-11-01 NOTE — Progress Notes (Addendum)
PROGRESS NOTE  Jessica [name] YHC:[id number] DOB: 10-18-1927 DOA: 10/30/2017 PCP: Binnie Rail, MD  Brief History:  83 year old female with a history of systolic and diastolic CHF, CAD, hypertension, CKD stage III, persistent atrial fibrillation on apixaban, SSS presenting with 1 week history of increasing lower extremity edema and shortness of breath.  The patient has also had some intermittent PND.  She states that she endorses compliance with her medications, but she seems to have poor insight regarding her medications upon further questioning.  The patient is also complaining of increasing abdominal bloating and girth over the past week.  She denies any fevers, chills, chest pain, nausea, vomiting, diarrhea, abdominal pain.  Upon presentation, this patient was noted to have BNP 3496.  EKG showed atrial fibrillation with ventricular paced complexes and nonspecific ST changes.  Chest x-ray showed a questionable left pleural effusion and vascular congestion without frank pulmonary edema.  Assessment/Plan: Acute on chronic systolic and diastolic CHF -daily weights -accurate I/O's -fluid restrict -02/24/2017 echo EF 30-35%, mild to moderate MR/TR, PAS P 46 -repeat Echo pending -continue IV Lasix 40 mg IV twice daily>>transition to po lasix this pm -NEG 3.2L L for the admission, but question accuracy -NEG 7 Lbs for the admission  CKD stage III  -baseline creatinine 1.0-1.3 -A.m. BMP -Monitor with diuresis  Permanent atrial fibrillation -Rate controlled -Continue apixaban  -continue carvedilol  Diabetes mellitus type 2 -10/14/2017 hemoglobin A1c 6.5 -NovoLog sliding scale -Holding metformin  Symptomatic bradycardia/sick sinus syndrome -Status post permanent pacemaker  Coronary artery disease -No chest pain presently -continue ASA -continue crestor  Hypokalemia -repleted -mag 1.8    Disposition Plan:   Home 12/31 if stable Family Communication:   No  Family at bedside  Consultants:  none  Code Status:  FULL   DVT Prophylaxis: apixaban   Procedures: As Listed in Progress Note Above  Antibiotics: None      Subjective: Patient denies fevers, chills, headache, chest pain, dyspnea, nausea, vomiting, diarrhea, abdominal pain, dysuria, hematuria, hematochezia, and melena.   Objective: Vitals:   10/31/17 1134 10/31/17 1627 10/31/17 2144 11/01/17 0447  BP: (!) 149/72 (!) 146/59 (!) 153/66 (!) 170/75  Pulse: 64 64 64 92  Resp: 18 16 18 18   Temp: 97.7 F (36.5 C) (!) 97.5 F (36.4 C) 98.3 F (36.8 C) 97.8 F (36.6 C)  TempSrc: Oral Oral Oral Oral  SpO2: 97% 94% 93% 92%  Weight:    68.6 kg (151 lb 3.2 oz)  Height:        Intake/Output Summary (Last 24 hours) at 11/01/2017 1045 Last data filed at 11/01/2017 5176 Gross per 24 hour  Intake 1083 ml  Output 3250 ml  Net -2167 ml   Weight change: -3.084 kg (-12.8 oz) Exam:   General:  Pt is alert, follows commands appropriately, not in acute distress  HEENT: No icterus, No thrush, No neck mass, [place]/AT  Cardiovascular: IRRR, S1/S2, no rubs, no gallops  Respiratory: bibasilar crackles, no wheeze  Abdomen: Soft/+BS, non tender, non distended, no guarding  Extremities: No edema, No lymphangitis, No petechiae, No rashes, no synovitis   Data Reviewed: I have personally reviewed following labs and imaging studies Basic Metabolic Panel: Recent Labs  Lab 10/30/17 1717 10/31/17 0535 11/01/17 0458  NA 138 139 141  K 4.3 3.3* 3.2*  CL 103 104 106  CO2 25 23 24   GLUCOSE 107* 123* 124*  BUN 18 16 14   CREATININE 1.21*  1.08* 1.05*  CALCIUM 10.2 9.8 9.5  MG  --   --  1.8   Liver Function Tests: Recent Labs  Lab 10/30/17 2259  AST 26  ALT 20  ALKPHOS 115  BILITOT 1.1  PROT 6.9  ALBUMIN 3.5   No results for input(s): LIPASE, AMYLASE in the last 168 hours. No results for input(s): AMMONIA in the last 168 hours. Coagulation Profile: No results for  input(s): INR, PROTIME in the last 168 hours. CBC: Recent Labs  Lab 10/30/17 1717  WBC 6.7  HGB 12.7  HCT 39.1  MCV 89.1  PLT 261   Cardiac Enzymes: No results for input(s): CKTOTAL, CKMB, CKMBINDEX, TROPONINI in the last 168 hours. BNP: Invalid input(s): POCBNP CBG: Recent Labs  Lab 10/31/17 0804 10/31/17 1304 10/31/17 1630 11/01/17 0740  GLUCAP 105* 112* 214* 118*   HbA1C: No results for input(s): HGBA1C in the last 72 hours. Urine analysis:    Component Value Date/Time   COLORURINE YELLOW 10/14/2017 1641   APPEARANCEUR Cloudy (A) 10/14/2017 1641   LABSPEC 1.010 10/14/2017 1641   PHURINE 6.0 10/14/2017 1641   GLUCOSEU NEGATIVE 10/14/2017 1641   HGBUR TRACE-INTACT (A) 10/14/2017 1641   BILIRUBINUR NEGATIVE 10/14/2017 1641   KETONESUR NEGATIVE 10/14/2017 1641   PROTEINUR NEGATIVE 03/28/2017 1443   UROBILINOGEN 0.2 10/14/2017 1641   NITRITE POSITIVE (A) 10/14/2017 1641   LEUKOCYTESUR SMALL (A) 10/14/2017 1641   Sepsis Labs: @LABRCNTIP (procalcitonin:4,lacticidven:4) )No results found for this or any previous visit (from the past 240 hour(s)).   Scheduled Meds: . apixaban  5 mg Oral BID  . aspirin EC  81 mg Oral Daily  . B-complex with vitamin C  1 tablet Oral Daily  . carvedilol  12.5 mg Oral BID WC  . docusate sodium  100 mg Oral QHS  . furosemide  40 mg Oral Daily  . insulin aspart  0-9 Units Subcutaneous TID WC  . omega-3 acid ethyl esters  1 g Oral q morning - 10a  . potassium chloride SA  20 mEq Oral Daily  . potassium chloride  40 mEq Oral Once  . sodium chloride flush  3 mL Intravenous Q12H   Continuous Infusions: . sodium chloride      Procedures/Studies: Dg Chest 2 View  Result Date: 10/30/2017 CLINICAL DATA:  Shortness of breath for 1 week. EXAM: CHEST  2 VIEW COMPARISON:  10/14/2017 FINDINGS: The heart is enlarged but stable. There is tortuosity and calcification of the thoracic aorta. The pacer wires are stable. No acute pulmonary findings.  Chronic bronchitic changes. Stable surgical changes involving the right chest wall and remote right humeral head and neck fractures. IMPRESSION: Stable cardiac enlargement. Chronic lung changes.  No acute pulmonary findings. Electronically Signed   By: Marijo Sanes M.D.   On: 10/30/2017 18:17   Dg Chest 2 View  Result Date: 10/14/2017 CLINICAL DATA:  Confusion x 7 days. Hx of HTN, DM, CHF, pacemaker, CAD, breast ca. EXAM: CHEST  2 VIEW COMPARISON:  03/28/2017 FINDINGS: LEFT-sided pacemaker overlies normal cardiac silhouette. Small LEFT effusion. The lungs are hyperinflated. No effusion, infiltrate pneumothorax. Atherosclerotic calcification of the aorta. IMPRESSION: Hyperinflated lungs.  No acute cardiopulmonary findings. Electronically Signed   By: Suzy Bouchard M.D.   On: 10/14/2017 17:12    Orson Eva, DO  [facility] Pager [phone]  If 7PM-7AM, please contact night-coverage [email or web address] Password [email or web address] 11/01/2017, 10:45 AM   LOS: 0 days

## 2017-11-02 DIAGNOSIS — I10 Essential (primary) hypertension: Secondary | ICD-10-CM

## 2017-11-02 DIAGNOSIS — N183 Chronic kidney disease, stage 3 (moderate): Secondary | ICD-10-CM

## 2017-11-02 DIAGNOSIS — I5043 Acute on chronic combined systolic (congestive) and diastolic (congestive) heart failure: Secondary | ICD-10-CM

## 2017-11-02 DIAGNOSIS — E876 Hypokalemia: Secondary | ICD-10-CM

## 2017-11-02 LAB — BASIC METABOLIC PANEL
Anion gap: 10 (ref 5–15)
BUN: 14 mg/dL (ref 6–20)
CALCIUM: 9.4 mg/dL (ref 8.9–10.3)
CO2: 28 mmol/L (ref 22–32)
CREATININE: 1.09 mg/dL — AB (ref 0.44–1.00)
Chloride: 101 mmol/L (ref 101–111)
GFR calc non Af Amer: 44 mL/min — ABNORMAL LOW (ref >60)
GFR, EST AFRICAN AMERICAN: 51 mL/min — AB (ref >60)
Glucose, Bld: 150 mg/dL — ABNORMAL HIGH (ref 65–99)
Potassium: 3.7 mmol/L (ref 3.5–5.1)
Sodium: 139 mmol/L (ref 135–145)

## 2017-11-02 LAB — GLUCOSE, CAPILLARY
GLUCOSE-CAPILLARY: 144 mg/dL — AB (ref 65–99)
Glucose-Capillary: 131 mg/dL — ABNORMAL HIGH (ref 65–99)

## 2017-11-02 LAB — MAGNESIUM: Magnesium: 2.4 mg/dL (ref 1.7–2.4)

## 2017-11-02 MED ORDER — GABAPENTIN 100 MG PO CAPS: ORAL_CAPSULE | ORAL | Status: DC

## 2017-11-02 NOTE — Discharge Summary (Addendum)
Physician Discharge Summary  Jessica [name] SFK:[id number] DOB: 03/12/1927  PCP: Binnie Rail, MD  Admit date: 10/30/2017 Discharge date: 11/02/2017  Recommendations for Outpatient Follow-up:  1. Dr. Billey Gosling, PCP in one week with repeat labs (CBC & BMP). 2. Dr. Ena Dawley, Cardiology in 2 weeks.  Home Health: Patient and family declined SNF recommended by PT. Home health PT. Equipment/Devices: None.    Discharge Condition: Improved and stable.  CODE STATUS: Full.  Diet recommendation: Heart healthy & diabetic diet.  Discharge Diagnoses:  Principal Problem:   CHF exacerbation ([name]) Active Problems:   HYPERTENSION   Pacemaker-[employer]   Hypertension   SSS (sick sinus syndrome) (HCC)   CAD (coronary artery disease)   CKD (chronic kidney disease) stage 3, GFR 30-59 ml/min (HCC)   AF (paroxysmal atrial fibrillation) (HCC)   Acute on chronic combined systolic and diastolic CHF (congestive heart failure) (HCC)   Pressure injury of skin   Permanent atrial fibrillation (HCC)   Chronic kidney disease, stage III (moderate) (HCC)   Cardiac pacemaker in situ   Brief Summary: 83 year old female with PMH of chronic combined systolic and diastolic CHF, CAD, HTN, stage III chronic kidney disease, persistent atrial fibrillation on Apixban, sick sinus syndrome status post pacemaker, presented to the hospital with one-week history of increasing lower extremity edema, dyspnea, PND. She reported compliance with medications but seemed to have poor insight regarding her medications. She also reported increasing abdominal bloating and girth over the week prior to admission. She denied chest pain. Upon initial evaluation, she was noted to have BNP of 3496, EKG showed A. fib with ventricular paced complexes and nonspecific ST changes. Chest x-ray showed a questionable left pleural effusion and vascular congestion without frank pulmonary edema. She was admitted for evaluation and management of  acute on chronic combined systolic and diastolic CHF.  Assessment and plan:  1. Acute on chronic systolic and diastolic CHF:? Related to indiscretion with diet. TTE 02/24/17: LVEF 30-35 percent. TTE 11/01/17: LVEF 30-35 percent, moderate diffuse hypokinesis, moderate LVH, mild AI and MR. Treated with IV Lasix 40 mg twice daily. -4.1 L since admission. Also -11 pounds since admission. Transitioned to prior home dose of Lasix 40 mg daily and close outpatient follow-up with PCP/cardiology. Clinically improved and euvolemic at this time. Upon chart review, her primary cardiologist had recommended stress testing in April to evaluate low EF but patient and daughter deferred getting stress test done and continued with maximal medical treatment. 2-D echo does not look substantially different from April. 2. Stage III chronic kidney disease: Baseline creatinine between 1-1.3. Creatinine is normal. 3. Permanent atrial fibrillation: Continue home dose of carvedilol and Apixaban. 4. DM type II: A1c 10/14/17:6.5. Patient not taking metformin at home so diet-controlled. Monitor as outpatient. Reasonable inpatient control. 5. Symptomatic bradycardia/sick sinus syndrome, s/p PPM: Follows outpatient with EP cardiology annually. 6. CAD: No reported chest pain. No aspirin PTA because she is on Eliquis. Continue Crestor and beta blockers. 7. Hypokalemia: Replaced. Magnesium 1.8.    Consultations:  None  Procedures:  None   Discharge Instructions  Discharge Instructions    (HEART FAILURE PATIENTS) Call MD:  Anytime you have any of the following symptoms: 1) 3 pound weight gain in 24 hours or 5 pounds in 1 week 2) shortness of breath, with or without a dry hacking cough 3) swelling in the hands, feet or stomach 4) if you have to sleep on extra pillows at night in order to breathe.   Complete by:  As directed    Call MD for:  difficulty breathing, headache or visual disturbances   Complete by:  As directed     Call MD for:  extreme fatigue   Complete by:  As directed    Call MD for:  persistant dizziness or light-headedness   Complete by:  As directed    Diet - low sodium heart healthy   Complete by:  As directed    Diet Carb Modified   Complete by:  As directed    Increase activity slowly   Complete by:  As directed        Medication List    STOP taking these medications   ciprofloxacin 250 MG tablet Commonly known as:  CIPRO   metFORMIN 500 MG tablet Commonly known as:  GLUCOPHAGE     TAKE these medications   apixaban 5 MG Tabs tablet Commonly known as:  ELIQUIS Take 1 tablet (5 mg total) by mouth 2 (two) times daily.   b complex vitamins tablet Take 1 tablet by mouth daily.   blood glucose meter kit and supplies Kit Dispense based on patient and insurance preference. Test sugars once daily and prn as directed. (FOR ICD-9 250.00, 250.01).   CALCIUM CITRATE + D PO Take 1 tablet by mouth daily with breakfast.   carvedilol 12.5 MG tablet Commonly known as:  COREG Take 1 tablet (12.5 mg total) by mouth 2 (two) times daily.   docusate sodium 100 MG capsule Commonly known as:  COLACE Take 100 mg by mouth at bedtime.   ECHINACEA PO Take 750 mg by mouth daily.   fish oil-omega-3 fatty acids 1000 MG capsule Take 1 g by mouth every morning.   furosemide 20 MG tablet Commonly known as:  LASIX Take 40 mg by mouth daily.   gabapentin 100 MG capsule Commonly known as:  NEURONTIN TAKE 300MG BY MOUTH IN THE MORNING AND 400MG AT NIGHT What changed:    See the new instructions.  Another medication with the same name was removed. Continue taking this medication, and follow the directions you see here.   nitroGLYCERIN 0.4 MG SL tablet Commonly known as:  NITROSTAT Place 1 tablet (0.4 mg total) under the tongue every 5 (five) minutes as needed for chest pain. x3 doses as needed for chest pain   potassium chloride SA 20 MEQ tablet Commonly known as:  K-DUR,KLOR-CON Take 1  tablet (20 mEq total) by mouth daily.   ROLLATOR ULTRA-LIGHT Misc With seat.  Diabetic neuropathy, knee arthritis   rosuvastatin 10 MG tablet Commonly known as:  CRESTOR TAKE ONE TABLET BY MOUTH ONCE DAILY IN THE MORNING      Follow-up Information    Binnie Rail, MD. Schedule an appointment as soon as possible for a visit in 1 week(s).   Specialty:  Internal Medicine Why:  To be seen with repeat labs (CBC & BMP). Contact information: [place] [address] [phone]        [name], MD. Schedule an appointment as soon as possible for a visit in 2 week(s).   Specialty:  Cardiology Contact information: [place] [address] [phone]          Allergies  Allergen Reactions  . Lyrica [Pregabalin] Swelling    Caused weight gain  . Amlodipine Swelling  . Sulfonamide Derivatives Rash      Procedures/Studies: Dg Chest 2 View  Result Date: 10/30/2017 CLINICAL DATA:  Shortness of breath  for 1 week. EXAM: CHEST  2 VIEW COMPARISON:  10/14/2017 FINDINGS: The heart is enlarged but stable. There is tortuosity and calcification of the thoracic aorta. The pacer wires are stable. No acute pulmonary findings. Chronic bronchitic changes. Stable surgical changes involving the right chest wall and remote right humeral head and neck fractures. IMPRESSION: Stable cardiac enlargement. Chronic lung changes.  No acute pulmonary findings. Electronically Signed   By: Marijo Sanes M.D.   On: 10/30/2017 18:17      Subjective: Patient denies complaints. Denies dyspnea, chest pain, palpitations, cough.  Discharge Exam:  Vitals:   11/01/17 2025 11/02/17 0524 11/02/17 0643 11/02/17 1104  BP: (!) 152/68  (!) 174/74 135/69  Pulse: 61  75 67  Resp: 18  19 18   Temp: 98.2 F (36.8 C)  98.5 F (36.9 C) 98.2 F (36.8 C)  TempSrc: Oral  Oral Oral  SpO2: 98%  95% 96%  Weight:  67.8 kg (149 lb 7.6 oz)    Height:        General:  Pleasant elderly female, moderately built and nourished, lying comfortably supine in bed. Cardiovascular: S1 & S2 heard, RRR, S1/S2 +. No murmurs, rubs, gallops or clicks. No JVD or pedal edema. Respiratory: Clear to auscultation without wheezing, rhonchi or crackles. No increased work of breathing. Abdominal:  Non distended, non tender & soft. No organomegaly or masses appreciated. Normal bowel sounds heard. CNS: Alert and oriented 2. No focal deficits. Extremities: no edema, no cyanosis    The results of significant diagnostics from this hospitalization (including imaging, microbiology, ancillary and laboratory) are listed below for reference.     Labs: CBC: Recent Labs  Lab 10/30/17 1717  WBC 6.7  HGB 12.7  HCT 39.1  MCV 89.1  PLT 791   Basic Metabolic Panel: Recent Labs  Lab 10/30/17 1717 10/31/17 0535 11/01/17 0458 11/02/17 0559  NA 138 139 141 139  K 4.3 3.3* 3.2* 3.7  CL 103 104 106 101  CO2 25 23 24 28   GLUCOSE 107* 123* 124* 150*  BUN 18 16 14 14   CREATININE 1.21* 1.08* 1.05* 1.09*  CALCIUM 10.2 9.8 9.5 9.4  MG  --   --  1.8 2.4   Liver Function Tests: Recent Labs  Lab 10/30/17 2259  AST 26  ALT 20  ALKPHOS 115  BILITOT 1.1  PROT 6.9  ALBUMIN 3.5   BNP (last 3 results) Recent Labs    03/28/17 1312 10/30/17 2259  BNP 772.5* 3,496.2*   CBG: Recent Labs  Lab 11/01/17 1115 11/01/17 1641 11/01/17 2024 11/02/17 0749 11/02/17 1148  GLUCAP 187* 111* 286* 131* 144*    I called and discussed with patient's daughter at length. Updated care and answered questions. Patient lives with her and daughter is there 24/7 to supervise and assist. She reiterates that patient and herself would prefer for patient to return home with home health therapies and patient has a Rollator and a cane and other DME already at home.  Time coordinating discharge: Less than 30 minutes  SIGNED:  Vernell Leep, MD, FACP, [facility]. [facility] Pager [phone]  [phone]  If 7PM-7AM, please contact night-coverage [email or web address] Password [email or web address] 11/02/2017, 1:02 PM

## 2017-11-02 NOTE — Discharge Instructions (Signed)

## 2017-11-02 NOTE — Progress Notes (Signed)
PT Cancellation Note  Patient Details Name: Jessica [name] MRN: [id number] DOB: 03/27/1927   Cancelled Treatment:    Reason Eval/Treat Not Completed: Other (comment)(CNAs bathing patient ) Attempted to perform skilled PT evaluation, CNAs bathing patient. Will return later if time allows.    Deniece Ree PT, DPT, CBIS  Supplemental Physical Therapist [facility]

## 2017-11-02 NOTE — Evaluation (Signed)
Occupational Therapy Evaluation Patient Details Name: Jessica [name] MRN: [id number] DOB: 1927-06-01 Today's Date: 11/02/2017    History of Present Illness 83 yo female with chronic heart failure for 1 year, reports she has not been watching salts/sugars around holidays. Dx CHF exacerbation. PMH hx breast CA, CAD, CHF, CKD, DM, HTN, heart cath, cardioversion, pacemaker, EF 35%, Afib    Clinical Impression   Pt reports she dresses and sponge bathes and can take herself to the bathroom. She is assisted for showering and all IADL. Pt presents with generalized weakness, impaired standing balance and cognitive impairment. No family available to determine baseline cognition. Pt to discharge home today, likely very near her baseline.     Follow Up Recommendations  No OT follow up;Supervision/Assistance - 24 hour    Equipment Recommendations  None recommended by OT    Recommendations for Other Services       Precautions / Restrictions Precautions Precautions: Fall Restrictions Weight Bearing Restrictions: No      Mobility Bed Mobility      General bed mobility comments: pt in chair  Transfers Overall transfer level: Needs assistance Equipment used: Rolling walker (2 wheeled) Transfers: Sit to/from Stand Sit to Stand: Min guard         General transfer comment: increased time and effort, pt uses lift chair at home    Balance Overall balance assessment: Needs assistance Sitting-balance support: Feet supported;Bilateral upper extremity supported Sitting balance-Leahy Scale: Fair     Standing balance support: Bilateral upper extremity supported;During functional activity Standing balance-Leahy Scale: Fair Standing balance comment: can release walker with one hand                           ADL either performed or assessed with clinical judgement   ADL Overall ADL's : Needs assistance/impaired Eating/Feeding: Independent;Sitting   Grooming: Brushing  hair;Sitting;Set up   Upper Body Bathing: Set up;Sitting   Lower Body Bathing: Sit to/from stand;Moderate assistance   Upper Body Dressing : Set up;Sitting   Lower Body Dressing: Minimal assistance;Sit to/from stand       Toileting- Water quality scientist and Hygiene: Minimal assistance(standing)         General ADL Comments: Pt seated in urine and aware, but decreased problem solving to call for help. Assist needed for placing telephone call to daughter.     Vision Baseline Vision/History: Wears glasses Wears Glasses: Reading only       Perception     Praxis      Pertinent Vitals/Pain Pain Assessment: No/denies pain     Hand Dominance Right   Extremity/Trunk Assessment Upper Extremity Assessment Upper Extremity Assessment: Generalized weakness   Lower Extremity Assessment Lower Extremity Assessment: Defer to PT evaluation   Cervical / Trunk Assessment Cervical / Trunk Assessment: Kyphotic   Communication Communication Communication: HOH   Cognition Arousal/Alertness: Awake/alert Behavior During Therapy: WFL for tasks assessed/performed Overall Cognitive Status: No family/caregiver present to determine baseline cognitive functioning                                     General Comments       Exercises     Shoulder Instructions      Home Living Family/patient expects to be discharged to:: Private residence Living Arrangements: Children Available Help at Discharge: Family;Available 24 hours/day Type of Home: House Home Access: Level entry  Home Layout: One level     Bathroom Shower/Tub: Walk-in shower;Tub/shower unit   [employer]: Standard     Home Equipment: Civil engineer, contracting;Shower seat - built in;Bedside commode;Walker - 2 wheels;Walker - 4 wheels;Grab bars - tub/shower;Other (comment)(lift chair)   Additional Comments: PLOF/equipment taken from prior notes, patient poor historian       Prior Functioning/Environment  Level of Independence: Needs assistance  Gait / Transfers Assistance Needed: uses rollator for gait.  ADL's / Homemaking Assistance Needed: daugther assists with housekeeping and cooking. occasional assistance with dressing and bathing Communication / Swallowing Assistance Needed: no difficulty          OT Problem List: Impaired balance (sitting and/or standing)      OT Treatment/Interventions:      OT Goals(Current goals can be found in the care plan section) Acute Rehab OT Goals Patient Stated Goal: to get better   OT Frequency:     Barriers to D/C:            Co-evaluation              AM-PAC PT "6 Clicks" Daily Activity     Outcome Measure Help from another person eating meals?: None Help from another person taking care of personal grooming?: A Little Help from another person toileting, which includes using toliet, bedpan, or urinal?: A Little Help from another person bathing (including washing, rinsing, drying)?: A Little Help from another person to put on and taking off regular upper body clothing?: None Help from another person to put on and taking off regular lower body clothing?: A Little 6 Click Score: 20   End of Session Equipment Utilized During Treatment: Rolling walker;Gait belt  Activity Tolerance: Patient tolerated treatment well Patient left: in chair;with call bell/phone within reach;with chair alarm set  OT Visit Diagnosis: Unsteadiness on feet (R26.81)                Time: 3220-2542 OT Time Calculation (min): 15 min Charges:  OT General Charges $OT Visit: 1 Visit OT Evaluation $OT Eval Moderate Complexity: 1 Mod G-Codes:     11/17/2017 Nestor Lewandowsky, OTR/L Pager: [place], [place] 11/17/17, 1:31 PM

## 2017-11-02 NOTE — Progress Notes (Signed)
Spoke to social work regarding PT recommendations for SNF.

## 2017-11-02 NOTE — Clinical Social Work Note (Signed)
Clinical Social Work Assessment  Patient Details  Name: Jessica [name] MRN: [id number] Date of Birth: 1927/06/28  Date of referral:  11/02/17               Reason for consult:  Facility Placement, Discharge Planning                Permission sought to share information with:  Family Supports Permission granted to share information::  Yes, Verbal Permission Granted  Name::     Jessica [name]  Agency::     Relationship::  Daughter  Contact Information:  [phone]  Housing/Transportation Living arrangements for the past 2 months:  [place] of Information:  Patient, Medical Team, Adult Children Patient Interpreter Needed:  None Criminal Activity/Legal Involvement Pertinent to Current Situation/Hospitalization:  No - Comment as needed Significant Relationships:  Adult Children Lives with:  Adult Children Do you feel safe going back to the place where you live?  Yes Need for family participation in patient care:  Yes (Comment)  Care giving concerns:  PT recommending SNF once medically stable for discharge.   Social Worker assessment / plan:  CSW met with patient. No supports at bedside. CSW introduced role and explained that PT recommendations would be discussed. Patient prefers to return home with home health but is agreeable to SNF if that is what her daughter wants her to do. CSW called and spoke with patient's daughter and she prefers home with home health as well. She states she believes they can manage at home. Patient is currently getting home health through [facility] and enjoys working with their staff member, Curator. RN, RNCM, and MD notified. No further concerns. CSW signing off as social work intervention is no longer needed.  Employment status:  Retired Nurse, adult PT Recommendations:  [place] / Referral to community resources:  [place]  Patient/Family's Response to care:   Patient and her daughter prefer to return home with home health. Patient's daughter supportive and involved in patient's care. Patient and her daughter appreciated social work intervention.  Patient/Family's Understanding of and Emotional Response to Diagnosis, Current Treatment, and Prognosis:  Patient and her daughter have a good understanding of the reason for admission and her need for continued therapy once discharged. Patient and her daughter appear happy with hospital care.  Emotional Assessment Appearance:  Appears stated age Attitude/Demeanor/Rapport:  Other(Pleasant) Affect (typically observed):  Accepting, Appropriate, Calm, Pleasant Orientation:  Oriented to Self, Oriented to Place, Oriented to  Time, Oriented to Situation Alcohol / Substance use:  Never Used Psych involvement (Current and /or in the community):  No (Comment)  Discharge Needs  Concerns to be addressed:  Care Coordination Readmission within the last 30 days:  No Current discharge risk:  Dependent with Mobility Barriers to Discharge:  No Barriers Identified   Candie Chroman, LCSW 11/02/2017, 12:28 PM

## 2017-11-02 NOTE — Evaluation (Signed)
Physical Therapy Evaluation Patient Details Name: Jessica [name] MRN: [id number] DOB: 27-Jun-1927 Today's Date: 11/02/2017   History of Present Illness  83 yo female issues with chronic heart failure for 1 year, reports she has not been watching salts/sugars around holidays. Dx CHF exacerbation. PMH hx breast CA, CAD, CHF, CKD, DM, HTN, heart cath, cardioversion, pacemaker, EF 35%, Afib   Clinical Impression   Patient received in bed, pleasant and willing to work with skilled PT services today. Note patient appears to be poor historian, thus PLOF/equipment information was taken from previous flowsheets. Patient does require Mod assist for bed mobility, however then is able to perform all transfers and gait with Min assist for safety, sequencing, and navigation. Note gait distance limited due to pain from corns on the soles of her feet. Patient left up in chair with all needs met and questions/concerns addressed this morning, chair alarm activated.     Follow Up Recommendations SNF    Equipment Recommendations  None recommended by PT    Recommendations for Other Services       Precautions / Restrictions Precautions Precautions: Fall Restrictions Weight Bearing Restrictions: No      Mobility  Bed Mobility Overal bed mobility: Needs Assistance Bed Mobility: Supine to Sit     Supine to sit: Mod assist     General bed mobility comments: for scooting hips forward to edge of bed and power up of trunk   Transfers Overall transfer level: Needs assistance Equipment used: Rolling walker (2 wheeled) Transfers: Sit to/from Stand Sit to Stand: Min assist         General transfer comment: VC for safety/sequencing   Ambulation/Gait Ambulation/Gait assistance: Min assist Ambulation Distance (Feet): 15 Feet Assistive device: Rolling walker (2 wheeled) Gait Pattern/deviations: Step-through pattern;Decreased step length - left;Decreased step length - right;Decreased dorsiflexion -  right;Decreased dorsiflexion - left;Drifts right/left;Trunk flexed     General Gait Details: Min assist to navigate with walker in room, safety   Stairs            Wheelchair Mobility    Modified Rankin (Stroke Patients Only)       Balance Overall balance assessment: Needs assistance Sitting-balance support: Feet supported;Bilateral upper extremity supported Sitting balance-Leahy Scale: Fair     Standing balance support: Bilateral upper extremity supported;During functional activity Standing balance-Leahy Scale: Fair                               Pertinent Vitals/Pain Pain Assessment: No/denies pain    Home Living Family/patient expects to be discharged to:: Private residence Living Arrangements: Children Available Help at Discharge: Family;Available 24 hours/day Type of Home: House Home Access: Level entry     Home Layout: One level Home Equipment: Shower seat;Shower seat - built in;Bedside commode;Walker - 2 wheels;Walker - 4 wheels;Grab bars - tub/shower Additional Comments: PLOF/equipment taken from prior notes, patient poor historian     Prior Function Level of Independence: Needs assistance   Gait / Transfers Assistance Needed: uses RW for gait.   ADL's / Homemaking Assistance Needed: daugther assists with housekeeping and cooking. occasional assistance with dressing and bathing        Hand Dominance   Dominant Hand: Right    Extremity/Trunk Assessment   Upper Extremity Assessment Upper Extremity Assessment: Defer to OT evaluation    Lower Extremity Assessment Lower Extremity Assessment: Generalized weakness    Cervical / Trunk Assessment Cervical / Trunk Assessment: Kyphotic  Communication   Communication: HOH  Cognition Arousal/Alertness: Awake/alert Behavior During Therapy: WFL for tasks assessed/performed Overall Cognitive Status: No family/caregiver present to determine baseline cognitive functioning                                         General Comments      Exercises     Assessment/Plan    PT Assessment Patient needs continued PT services  PT Problem List Decreased strength;Decreased mobility;Decreased coordination;Decreased balance       PT Treatment Interventions DME instruction;Therapeutic activities;Gait training;Therapeutic exercise;Patient/family education;Balance training;Stair training;Functional mobility training;Neuromuscular re-education    PT Goals (Current goals can be found in the Care Plan section)  Acute Rehab PT Goals Patient Stated Goal: to get better  PT Goal Formulation: With patient Time For Goal Achievement: 11/09/17 Potential to Achieve Goals: Good    Frequency Min 3X/week   Barriers to discharge        Co-evaluation               AM-PAC PT "6 Clicks" Daily Activity  Outcome Measure Difficulty turning over in bed (including adjusting bedclothes, sheets and blankets)?: Unable Difficulty moving from lying on back to sitting on the side of the bed? : Unable Difficulty sitting down on and standing up from a chair with arms (e.g., wheelchair, bedside commode, etc,.)?: Unable Help needed moving to and from a bed to chair (including a wheelchair)?: A Little Help needed walking in hospital room?: A Little Help needed climbing 3-5 steps with a railing? : A Lot 6 Click Score: 11    End of Session Equipment Utilized During Treatment: Gait belt Activity Tolerance: Patient tolerated treatment well Patient left: in chair;with chair alarm set;with call bell/phone within reach Nurse Communication: Mobility status;Other (comment)(PT recommendations ) PT Visit Diagnosis: Unsteadiness on feet (R26.81);Muscle weakness (generalized) (M62.81);Difficulty in walking, not elsewhere classified (R26.2)    Time: 6004-5997 PT Time Calculation (min) (ACUTE ONLY): 23 min   Charges:   PT Evaluation $PT Eval Moderate Complexity: 1 Mod PT  Treatments $Therapeutic Activity: 8-22 mins   PT G Codes:   PT G-Codes **NOT FOR INPATIENT CLASS** Functional Assessment Tool Used: AM-PAC 6 Clicks Basic Mobility;Clinical judgement Functional Limitation: Mobility: Walking and moving around Mobility: Walking and Moving Around Current Status (F4142): At least 40 percent but less than 60 percent impaired, limited or restricted Mobility: Walking and Moving Around Goal Status [phone]): At least 20 percent but less than 40 percent impaired, limited or restricted   Deniece Ree PT, DPT, CBIS  Supplemental Physical Therapist [facility]

## 2017-11-02 NOTE — Care Management Note (Addendum)
Case Management Note  Patient Details  Name: Jessica [name] MRN: [id number] Date of Birth: 1926-11-19  Subjective/Objective:     Admitted for Congestive Heart Failure.         Action/Plan: In to speak the patient,  Prior to admission patient lives at home with daughter.  His PCP is Billey Gosling, MD.  Home DME: Cane, rolling walker, Rollator, shower chair and weight scale.  Patient states daughter does the cooking and they know their not suppose to use salt.  Her daughter also does shopping and takes her medical appointments.  Patient denies inability to afford medications or food.    Expected Discharge Date:  11/02/2017               Expected Discharge Plan:  Home/Self Care  Discharge planning Services  CM Consult  Status of Service:  In process, will continue to follow  Kristen Cardinal, BSN, RN Case Manager-Orientation [phone] 11/02/2017, 10:31 AM

## 2017-11-04 ENCOUNTER — Telehealth: Payer: Self-pay | Admitting: *Deleted

## 2017-11-04 ENCOUNTER — Telehealth: Payer: Self-pay | Admitting: Internal Medicine

## 2017-11-04 NOTE — Telephone Encounter (Signed)
Transition Care Management Follow-up Telephone Call   Date discharged? 11/02/17   How have you been since you were released from the hospital? Daughter [name]) states mom is doing alright   Do you understand why you were in the hospital? YES   Do you understand the discharge instructions? YES   Where were you discharged to? Home   Items Reviewed:  Medications reviewed: YES  Allergies reviewed: YES  Dietary changes reviewed: YES, heart healthy & diabetic diet  Referrals reviewed: YES, will be seeing cardiology 11/06/16   Functional Questionnaire:   Activities of Daily Living (ADLs):   She states she are independent in the following: feeding, continence, grooming and toileting States she require assistance with the following: ambulation, bathing and hygiene and dressing   Any transportation issues/concerns?: NO   Any patient concerns? NO   Confirmed importance and date/time of follow-up visits scheduled YES, appt 11/11/17  Provider Appointment booked with Dr. Quay Burow  Confirmed with patient if condition begins to worsen call PCP or go to the ER.  Patient was given the office number and encouraged to call back with question or concerns.  : YES

## 2017-11-04 NOTE — Telephone Encounter (Signed)
Copied from [place] [id number]. Topic: General - Other >> Nov 04, 2017 12:00 PM Aurelio Brash B wrote: Reason for CRM: [facility] received referral for home health and will go out to see pt on Friday 1/4    [phone]

## 2017-11-04 NOTE — Telephone Encounter (Signed)
noted 

## 2017-11-06 ENCOUNTER — Encounter (INDEPENDENT_AMBULATORY_CARE_PROVIDER_SITE_OTHER): Payer: Self-pay

## 2017-11-06 ENCOUNTER — Ambulatory Visit: Payer: Medicare Other | Admitting: Cardiology

## 2017-11-06 ENCOUNTER — Encounter: Payer: Self-pay | Admitting: Cardiology

## 2017-11-06 VITALS — BP 118/68 | HR 71 | Ht 63.0 in | Wt 150.0 lb

## 2017-11-06 DIAGNOSIS — N183 Chronic kidney disease, stage 3 unspecified: Secondary | ICD-10-CM

## 2017-11-06 DIAGNOSIS — I495 Sick sinus syndrome: Secondary | ICD-10-CM

## 2017-11-06 DIAGNOSIS — E7800 Pure hypercholesterolemia, unspecified: Secondary | ICD-10-CM

## 2017-11-06 DIAGNOSIS — I482 Chronic atrial fibrillation: Secondary | ICD-10-CM | POA: Diagnosis not present

## 2017-11-06 DIAGNOSIS — I2510 Atherosclerotic heart disease of native coronary artery without angina pectoris: Secondary | ICD-10-CM

## 2017-11-06 DIAGNOSIS — I10 Essential (primary) hypertension: Secondary | ICD-10-CM

## 2017-11-06 DIAGNOSIS — I4821 Permanent atrial fibrillation: Secondary | ICD-10-CM

## 2017-11-06 DIAGNOSIS — I5043 Acute on chronic combined systolic (congestive) and diastolic (congestive) heart failure: Secondary | ICD-10-CM

## 2017-11-06 DIAGNOSIS — Z7901 Long term (current) use of anticoagulants: Secondary | ICD-10-CM | POA: Diagnosis not present

## 2017-11-06 MED ORDER — ROSUVASTATIN CALCIUM 5 MG PO TABS: 5.0000 mg | ORAL_TABLET | Freq: Every day | ORAL | 3 refills | Status: DC

## 2017-11-06 NOTE — Patient Instructions (Signed)
Medication Instructions:   DECREASE YOUR ROSUVASTATIN (CRESTOR) TO 5 MG ONCE DAILY    Labwork:  TODAY--BMET AND PRO-BNP     Follow-Up:  2 MONTHS WITH DR [name]       If you need a refill on your cardiac medications before your next appointment, please call your pharmacy.

## 2017-11-06 NOTE — Progress Notes (Signed)
CARDIOLOGY OFFICE NOTE  Date:  11/06/2017    [name] Date of Birth: 1927/02/25 Medical Record #[id number]  PCP:  Binnie Rail, MD  Cardiologist:  [name]   Reason for visit: Posthospital TOC visit  History of Present Illness: Jessica [name] is a 84 y.o. female who presents today for a follow up visit. Seen for Dr. [name].   She has a history of permanent a fib on Eliquis, CAD with stents in 2010 to the RCA, chronic diastolic HF, CKD, DM, HLD, SSS with MDT PPM in 2011. Last echo 02/24/17 EF had dropped to 30-35%, mild AR, mild to mod MR, TR mild to mod. PA pk pressure 46 mmHg. Nuclear stress testing was recommended to rule out ischemia - family declined and opted for more conservative approach. Last cath 2010 with stents.  Last seen back by Dr. [name] in July. Lasix was increased for a few days.   Saw Cecilie Kicks, NP in September. Remained short of breath and having increased weight.    08/25/2017 - Comes in today. Here with her daughter. "Chore" to get ready to come here today. Forgot her hearing aids. More short of breath and has more swelling today - it continues to come and go - daughter gives her extra lasix based on the weights. Not really getting too much salt. Denies chest pain. Asking for a flu shot today. Seems to be getting more deconditioned. Not walking as much and legs seem weaker. Daughter notes more confusion at times.   11/06/2017 - the patient is coming for days after being discharged from the hospital for acute on chronic combined systolic and diastolic heart failure. The patient had on urinary tract infection in December for which she was treated with ciprofloxacin. Afterwards she developed worsening lower extremity edema and difficulty breathing. She was admitted to the hospital on December 28 and discharged on December 31. She states stated that she was feeling better over Christmas but didn't want to go to the hospital. She was diuresed 12 pounds and felt  significantly better today she denies any lower extremity edema orthopnea or proximal nocturnal dyspnea.   Past Medical History:  Diagnosis Date  . Breast cancer ([place])   . CAD (coronary artery disease)    a. s/p DESx2 to mid and distal RCA in 2010.  Marland Kitchen Cholelithiasis 09/12/2013   Lap Chole on 09/14/13   . Chronic diastolic CHF (congestive heart failure) ([place])    a. Echo 06/09/15: EF 55-60%, normal wall motion, mild AI, mild to moderate MR, mild LAE, mildly reduced RVSF, mild RAE, moderate TR, PASP 53 mmHg  . CKD (chronic kidney disease), stage III ([place])   . Diabetes mellitus    NON INSULIN DEPENDENT  . DJD (degenerative joint disease)   . GERD (gastroesophageal reflux disease)   . Hemorrhoids   . History of diabetic neuropathy   . Hypercholesterolemia   . Hypertension   . Hypertensive heart disease   . Paroxysmal atrial fibrillation ([facility])    a. discovered on PPM interrogation 12/15, CHAD2VASC score is 6. b. decompensation with dCHF in 06/2015 possibly due to AF, s/p DCCV.  . SSS (sick sinus syndrome) ([place])    a. s/p Medtronic PPM by JA for SSS and syncope 9/11.    Past Surgical History:  Procedure Laterality Date  . CARDIAC CATHETERIZATION  07/05/2009   EF 55-60%  . CARDIOVERSION N/A 06/11/2015   Procedure: CARDIOVERSION;  Surgeon: [name], MD;  Location: [facility];  Service: Cardiovascular;  Laterality: N/A;  . CHOLECYSTECTOMY N/A 09/14/2013   Procedure: LAPAROSCOPIC CHOLECYSTECTOMY WITH INTRAOPERATIVE CHOLANGIOGRAM;  Surgeon: [name], MD;  Location: [facility];  Service: General;  Laterality: N/A;  . COLONOSCOPY    . ERCP N/A 09/13/2013   Procedure: ENDOSCOPIC RETROGRADE CHOLANGIOPANCREATOGRAPHY (ERCP);  Surgeon: [name], MD;  Location: [facility] ENDOSCOPY;  Service: Endoscopy;  Laterality: N/A;  . INSERT / REPLACE / REMOVE PACEMAKER  9/11   SSS and syncope, implanted by JA (MDT)  . MASTECTOMY  1992   BILATERAL WITH RECONSTRUCTION     Medications: Current  Meds  Medication Sig  . apixaban (ELIQUIS) 5 MG TABS tablet Take 1 tablet (5 mg total) by mouth 2 (two) times daily.  Marland Kitchen b complex vitamins tablet Take 1 tablet by mouth daily.  . blood glucose meter kit and supplies KIT Dispense based on patient and insurance preference. Test sugars once daily and prn as directed. (FOR ICD-9 250.00, 250.01).  . Calcium Citrate-Vitamin D (CALCIUM CITRATE + D PO) Take 1 tablet by mouth daily with breakfast.  . carvedilol (COREG) 12.5 MG tablet Take 1 tablet (12.5 mg total) by mouth 2 (two) times daily.  Marland Kitchen docusate sodium (COLACE) 100 MG capsule Take 100 mg by mouth at bedtime.  Marland Kitchen ECHINACEA PO Take 750 mg by mouth daily.  . fish oil-omega-3 fatty acids 1000 MG capsule Take 1 g by mouth every morning.   . furosemide (LASIX) 20 MG tablet Take 40 mg by mouth daily.   Marland Kitchen gabapentin (NEURONTIN) 100 MG capsule TAKE 300MG BY MOUTH IN THE MORNING AND 400MG AT NIGHT  . Misc. Devices (ROLLATOR ULTRA-LIGHT) MISC With seat.  Diabetic neuropathy, knee arthritis  . nitroGLYCERIN (NITROSTAT) 0.4 MG SL tablet Place 1 tablet (0.4 mg total) under the tongue every 5 (five) minutes as needed for chest pain. x3 doses as needed for chest pain  . potassium chloride SA (K-DUR,KLOR-CON) 20 MEQ tablet Take 1 tablet (20 mEq total) by mouth daily.  . [DISCONTINUED] rosuvastatin (CRESTOR) 10 MG tablet TAKE ONE TABLET BY MOUTH ONCE DAILY IN THE MORNING     Allergies: Allergies  Allergen Reactions  . Lyrica [Pregabalin] Swelling    Caused weight gain  . Amlodipine Swelling  . Sulfonamide Derivatives Rash    Social History: The patient  reports that  has never smoked. she has never used smokeless tobacco. She reports that she does not drink alcohol or use drugs.   Family History: The patient's family history includes Cancer in her father and mother.   Review of Systems: Please see the history of present illness.   Otherwise, the review of systems is positive for none.   All other  systems are reviewed and negative.   Physical Exam: VS:  BP 118/68   Pulse 71   Ht 5' 3"  (1.6 m)   Wt 150 lb (68 kg)   LMP  (LMP Unknown)   SpO2 94%   BMI 26.57 kg/m  .  BMI Body mass index is 26.57 kg/m.  Wt Readings from Last 3 Encounters:  11/06/17 150 lb (68 kg)  11/02/17 149 lb 7.6 oz (67.8 kg)  10/14/17 162 lb (73.5 kg)    General: Pleasant. Little hard of hearing. Elderly female who is alert and in no acute distress.  HEENT: Normal.  Neck: Supple, no JVD, carotid bruits, or masses noted.  Cardiac: Fairly regular - presumed paced. No edema.  Respiratory:  Lungs are clear to auscultation bilaterally with normal work  of breathing.  GI: Soft and nontender.  MS: No deformity or atrophy. Gait and ROM intact.  Skin: Warm and dry. Color is pale.   Neuro:  Strength and sensation are intact and no gross focal deficits noted.  Psych: Alert, appropriate and with normal affect.   LABORATORY DATA:  EKG:  EKG is not ordered today.  Lab Results  Component Value Date   WBC 6.7 10/30/2017   HGB 12.7 10/30/2017   HCT 39.1 10/30/2017   PLT 261 10/30/2017   GLUCOSE 150 (H) 11/02/2017   CHOL 101 06/09/2015   TRIG 52 06/09/2015   HDL 38 (L) 06/09/2015   LDLDIRECT 152.0 10/10/2013   LDLCALC 53 06/09/2015   ALT 20 10/30/2017   AST 26 10/30/2017   NA 139 11/02/2017   K 3.7 11/02/2017   CL 101 11/02/2017   CREATININE 1.09 (H) 11/02/2017   BUN 14 11/02/2017   CO2 28 11/02/2017   TSH 3.500 02/11/2017   INR 0.95 07/15/2010   HGBA1C 6.5 10/14/2017     BNP (last 3 results) Recent Labs    03/28/17 1312 10/30/17 2259  BNP 772.5* 3,496.2*    ProBNP (last 3 results) Recent Labs    06/12/17 0914 10/14/17 1641 10/19/17 1509  PROBNP 7,665* 2,662.0* 1,542.0*     Other Studies Reviewed Today:  Echo Study Conclusions 02/2017  - Left ventricle: The cavity size was normal. Wall thickness was   normal. Systolic function was moderately to severely reduced. The    estimated ejection fraction was in the range of 30% to 35%. - Aortic valve: Mildly calcified annulus. Mildly thickened, mildly   calcified leaflets. There was mild regurgitation. - Mitral valve: There was mild to moderate regurgitation. - Right ventricle: Systolic function was mildly to moderately   reduced. - Tricuspid valve: There was mild-moderate regurgitation. - Pulmonary arteries: Systolic pressure was moderately increased.   PA peak pressure: 46 mm Hg (S).  CARDIAC CATH FINDINGS 07/2009:   LV showed good LV systolic function.  There was 3+ catheter-  induced MR.  EF of 55-60%.  Left main was patent proximally and had 15-  20% distal stenosis.  LAD has 15-20% proximal stenosis and 30% proximal  bifurcation stenosis with diagonal-1.  Diagonal-1 had 60% ostial  stenosis.  [place]amus was very small, which was patent.  Left circumflex has  10-15% ostial stenosis and it tapers down in the AV groove after giving  off OM-2.  OM-1 is very, very small.  OM-2 was moderate size, which was  patent.  RCA has 10-15% proximal stenosis and 70-75% sequential mid  stenosis and 70-75% focal distal stenosis with TIMI grade 3 distal flow.   INTERVENTIONAL PROCEDURE:  Successful PTCA to distal RCA and proximal  RCA was done using 2.5 x 12-mm long [place] Voyager balloon for pre-  dilatation and then 2.5 x 15-mm long Xience V stent was deployed in  distal RCA at 11 atmospheric pressure.  Stent was post-dilated using  2.75 x 12-mm long [place] Voyager balloon going up to 18 atmospheric pressure  and then 3.5 x 28 mm long Xience V stent was deployed at 11 atmospheric  pressure in mid RCA.  Stent was post-dilated using 3.75 x 20 mm long [place]  Voyager balloon going up to 18 and 20 atmospheric pressure.  Lesions  were dilated from 70-75% to 0% with excellent TIMI grade 3 distal flow  without evidence of dissection or distal embolization.  The patient  received weight-based Angiomax and 600 mg  of Plavix during the   procedure.  The patient did not have any episodes of marked sinus  bradycardia or pauses during the procedure.  The patient tolerated the  procedure well.  There were no complications.  The patient was  transferred to recovery room in stable condition.    Assessment/Plan:  1.  Acute on chronic combined diastolic and systolic HF - she was just discharged from the hospital on Lasix 40 mg daily, her creatinine was 1.1 and electrolytes were normal. We will recheck her BMP and BNP today, BNP in the hospital 3500, we will adjust her a right extended if needed. She is advised to take an extra 20 mg of Lasix in the afternoon if her weight goes 3 pounds overnight or 5 pounds in a week. The patient feels profoundly weak, she is advised to walk as much as she can drink a day at least 15 minutes a day to remain her muscle strength.  2. CM with EF 30-35% pt and daughter did not wish to have stress testing or cardiac cath. They favor conservative management.   3.  CKD - not on ACE/ARB, creatinine stable at 1.1 we'll recheck today.  4.  HTN - controlled.  5.  Persistent AF - managed with PPM in place, rate control and anticoagulation. She has no bleeding, most recent hemoglobin 12.7.  6. Chronic anticoagulation with Eliquis - as above.  7. PPM with SSS - she saw Dr. [name] in November and her pacemaker was functioning properly.  Current medicines are reviewed with the patient today.  The patient does not have concerns regarding medicines other than what has been noted above.  The following changes have been made:  See above.  Labs/ tests ordered today include:    Orders Placed This Encounter  Procedures  . Basic Metabolic Panel (BMET)  . Pro b natriuretic peptide  . EKG 12-Lead     Disposition:   FU with Dr. [name] and her team. Advised high dose flu vaccine - we did have this available and this was given today.   Patient is agreeable to this plan and will call if any problems develop  in the interim.   Signed: Ena Dawley, MD  11/06/2017 4:44 PM  [facility] [address] [place],   [address] Phone: [phone] Fax: [phone]

## 2017-11-07 ENCOUNTER — Other Ambulatory Visit: Payer: Self-pay | Admitting: Internal Medicine

## 2017-11-07 LAB — PRO B NATRIURETIC PEPTIDE: NT-Pro BNP: 12294 pg/mL — ABNORMAL HIGH (ref 0–738)

## 2017-11-07 LAB — BASIC METABOLIC PANEL
BUN/Creatinine Ratio: 18 (ref 12–28)
BUN: 20 mg/dL (ref 8–27)
CO2: 25 mmol/L (ref 20–29)
Calcium: 10.8 mg/dL — ABNORMAL HIGH (ref 8.7–10.3)
Chloride: 102 mmol/L (ref 96–106)
Creatinine, Ser: 1.12 mg/dL — ABNORMAL HIGH (ref 0.57–1.00)
GFR calc Af Amer: 51 mL/min/{1.73_m2} — ABNORMAL LOW (ref >59)
GFR calc non Af Amer: 44 mL/min/{1.73_m2} — ABNORMAL LOW (ref >59)
Glucose: 107 mg/dL — ABNORMAL HIGH (ref 65–99)
Potassium: 4.5 mmol/L (ref 3.5–5.2)
Sodium: 141 mmol/L (ref 134–144)

## 2017-11-09 ENCOUNTER — Other Ambulatory Visit: Payer: Self-pay | Admitting: *Deleted

## 2017-11-09 MED ORDER — FUROSEMIDE 20 MG PO TABS: ORAL_TABLET | ORAL | 6 refills | Status: DC

## 2017-11-09 NOTE — Telephone Encounter (Signed)
Please advise on dosage, med list shows a different dosage

## 2017-11-11 ENCOUNTER — Inpatient Hospital Stay: Payer: Medicare Other | Admitting: Internal Medicine

## 2017-11-19 ENCOUNTER — Other Ambulatory Visit: Payer: Self-pay | Admitting: Cardiology

## 2017-11-20 ENCOUNTER — Ambulatory Visit: Payer: Medicare Other | Admitting: Podiatry

## 2017-11-20 DIAGNOSIS — E1140 Type 2 diabetes mellitus with diabetic neuropathy, unspecified: Secondary | ICD-10-CM

## 2017-11-20 DIAGNOSIS — M79676 Pain in unspecified toe(s): Secondary | ICD-10-CM | POA: Diagnosis not present

## 2017-11-20 DIAGNOSIS — B351 Tinea unguium: Secondary | ICD-10-CM | POA: Diagnosis not present

## 2017-11-23 NOTE — Progress Notes (Signed)
Subjective: 84 y.o. returns the office today for painful, elongated, thickened toenails which she cannot trim herself. Denies any redness or drainage around the nails.  Denies any acute changes since last appointment and no new complaints today. Denies any systemic complaints such as fevers, chills, nausea, vomiting.   Objective: AAO 3, NAD DP/PT pulses palpable, CRT less than 3 seconds  Nails hypertrophic, dystrophic, elongated, brittle, discolored 10. There is tenderness overlying the nails 1-5 bilaterally. There is no surrounding erythema or drainage along the nail sites. Minimal hyperkeratotic lesion submetatarsal foot.  No open lesions or pre-ulcerative lesions are identified. No other areas of tenderness bilateral lower extremities. No overlying edema, erythema, increased warmth. No pain with calf compression, swelling, warmth, erythema.  Assessment: Patient presents with symptomatic onychomycosis; porokeratosis  Plan: -Treatment options including alternatives, risks, complications were discussed -Nails sharply debrided 10 without complication/bleeding. -Discussed daily foot inspection. If there are any changes, to call the office immediately.  -Follow-up in 3 months or sooner if any problems are to arise. In the meantime, encouraged to call the office with any questions, concerns, changes symptoms.  Celesta Gentile, DPM

## 2017-11-24 ENCOUNTER — Telehealth: Payer: Self-pay

## 2017-11-24 DIAGNOSIS — E860 Dehydration: Secondary | ICD-10-CM

## 2017-11-24 DIAGNOSIS — I110 Hypertensive heart disease with heart failure: Secondary | ICD-10-CM

## 2017-11-24 MED ORDER — CARVEDILOL 12.5 MG PO TABS
12.5000 mg | ORAL_TABLET | Freq: Two times a day (BID) | ORAL | 3 refills | Status: DC
Start: 2017-11-24 — End: 2018-02-23

## 2017-11-24 NOTE — Telephone Encounter (Signed)
Jessica [name] [place] called requesting a refill on carvedilol for [facility], medication was filled.

## 2017-12-02 ENCOUNTER — Ambulatory Visit (INDEPENDENT_AMBULATORY_CARE_PROVIDER_SITE_OTHER): Payer: Medicare Other | Admitting: *Deleted

## 2017-12-02 ENCOUNTER — Telehealth: Payer: Self-pay | Admitting: Cardiology

## 2017-12-02 DIAGNOSIS — I495 Sick sinus syndrome: Secondary | ICD-10-CM | POA: Diagnosis not present

## 2017-12-02 NOTE — Telephone Encounter (Signed)
Confirmed remote transmission w/ pt daughter.   

## 2017-12-04 ENCOUNTER — Encounter: Payer: Self-pay | Admitting: Cardiology

## 2017-12-04 NOTE — Progress Notes (Signed)
Remote pacemaker transmission.   

## 2017-12-18 LAB — CUP PACEART REMOTE DEVICE CHECK
Battery Voltage: 2.78 V
Brady Statistic AP VS Percent: 0 %
Brady Statistic AS VP Percent: 2 %
Brady Statistic AS VS Percent: 96 %
Date Time Interrogation Session: 20190131195313
Implantable Lead Implant Date: 20110912
Implantable Lead Location: 753860
Lead Channel Pacing Threshold Amplitude: 0.75 V
Lead Channel Pacing Threshold Amplitude: 0.75 V
Lead Channel Pacing Threshold Pulse Width: 0.4 ms
Lead Channel Pacing Threshold Pulse Width: 0.4 ms
Lead Channel Setting Pacing Amplitude: 2.5 V
Lead Channel Setting Pacing Pulse Width: 0.4 ms
MDC IDC LEAD IMPLANT DT: 20110912
MDC IDC LEAD LOCATION: 753859
MDC IDC MSMT BATTERY IMPEDANCE: 683 Ohm
MDC IDC MSMT BATTERY REMAINING LONGEVITY: 75 mo
MDC IDC MSMT LEADCHNL RA IMPEDANCE VALUE: 502 Ohm
MDC IDC MSMT LEADCHNL RV IMPEDANCE VALUE: 651 Ohm
MDC IDC PG IMPLANT DT: 20110912
MDC IDC SET LEADCHNL RA PACING AMPLITUDE: 2 V
MDC IDC SET LEADCHNL RV SENSING SENSITIVITY: 2 mV
MDC IDC STAT BRADY AP VP PERCENT: 2 %

## 2017-12-25 ENCOUNTER — Ambulatory Visit: Payer: Medicare Other | Admitting: Cardiology

## 2018-01-08 ENCOUNTER — Ambulatory Visit (INDEPENDENT_AMBULATORY_CARE_PROVIDER_SITE_OTHER): Payer: Medicare Other | Admitting: Cardiology

## 2018-01-08 ENCOUNTER — Encounter: Payer: Self-pay | Admitting: Cardiology

## 2018-01-08 ENCOUNTER — Other Ambulatory Visit: Payer: Self-pay | Admitting: Cardiology

## 2018-01-08 VITALS — BP 140/84 | HR 85 | Ht 63.0 in | Wt 148.0 lb

## 2018-01-08 DIAGNOSIS — I509 Heart failure, unspecified: Secondary | ICD-10-CM

## 2018-01-08 DIAGNOSIS — I5042 Chronic combined systolic (congestive) and diastolic (congestive) heart failure: Secondary | ICD-10-CM

## 2018-01-08 DIAGNOSIS — I2510 Atherosclerotic heart disease of native coronary artery without angina pectoris: Secondary | ICD-10-CM

## 2018-01-08 DIAGNOSIS — I10 Essential (primary) hypertension: Secondary | ICD-10-CM

## 2018-01-08 NOTE — Patient Instructions (Signed)
Medication Instructions:   Your physician recommends that you continue on your current medications as directed. Please refer to the Current Medication list given to you today.   Labwork:  TODAY--BMET AND PRO-BNP    Follow-Up:  3 MONTHS WITH DR NELSON OR WITH AN EXTENDER, ON SAME DAY DR NELSON IS IN THE OFFICE       If you need a refill on your cardiac medications before your next appointment, please call your pharmacy.

## 2018-01-08 NOTE — Progress Notes (Signed)
CARDIOLOGY OFFICE NOTE  Date:  01/08/2018    Jessica [name] Date of Birth: 1927/01/23 Medical Record #[id number]  PCP:  Binnie Rail, MD  Cardiologist:  [name]   Reason for visit: Posthospital TOC visit  History of Present Illness: Jessica [name] is a 84 y.o. female who presents today for a follow up visit. Seen for Dr. [name].   She has a history of permanent a fib on Eliquis, CAD with stents in 2010 to the RCA, chronic diastolic HF, CKD, DM, HLD, SSS with MDT PPM in 2011. Last echo 02/24/17 EF had dropped to 30-35%, mild AR, mild to mod MR, TR mild to mod. PA pk pressure 46 mmHg. Nuclear stress testing was recommended to rule out ischemia - family declined and opted for more conservative approach. Last cath 2010 with stents.  Last seen back by Dr. [name] in July. Lasix was increased for a few days.   Saw Cecilie Kicks, NP in September. Remained short of breath and having increased weight.    08/25/2017 - Comes in today. Here with her daughter. "Chore" to get ready to come here today. Forgot her hearing aids. More short of breath and has more swelling today - it continues to come and go - daughter gives her extra lasix based on the weights. Not really getting too much salt. Denies chest pain. Asking for a flu shot today. Seems to be getting more deconditioned. Not walking as much and legs seem weaker. Daughter notes more confusion at times.   11/06/2017 - the patient is coming for days after being discharged from the hospital for acute on chronic combined systolic and diastolic heart failure. The patient had on urinary tract infection in December for which she was treated with ciprofloxacin. Afterwards she developed worsening lower extremity edema and difficulty breathing. She was admitted to the hospital on December 28 and discharged on December 31. She states stated that she was feeling better over Christmas but didn't want to go to the hospital. She was diuresed 12 pounds and felt  significantly better today she denies any lower extremity edema orthopnea or proximal nocturnal dyspnea.  01/08/2018, this is 2 months follow-up, at the last visit she is complaining of shortness of breath and lower extremity edema, her labs were checked with normal creatinine and BNP of 12,000, her Lasix was increased to 40 morning and 20 in the afternoon. Since then she has lost several pounds, altogether 12 since December, and feels significantly better, no no lower extremity edema and wears compression socks, no orthopnea paroxysmal nocturnal dyspnea. She is tolerating all of her medications.  Past Medical History:  Diagnosis Date  . Breast cancer ([place])   . CAD (coronary artery disease)    a. s/p DESx2 to mid and distal RCA in 2010.  Marland Kitchen Cholelithiasis 09/12/2013   Lap Chole on 09/14/13   . Chronic diastolic CHF (congestive heart failure) ([place])    a. Echo 06/09/15: EF 55-60%, normal wall motion, mild AI, mild to moderate MR, mild LAE, mildly reduced RVSF, mild RAE, moderate TR, PASP 53 mmHg  . CKD (chronic kidney disease), stage III ([place])   . Diabetes mellitus    NON INSULIN DEPENDENT  . DJD (degenerative joint disease)   . GERD (gastroesophageal reflux disease)   . Hemorrhoids   . History of diabetic neuropathy   . Hypercholesterolemia   . Hypertension   . Hypertensive heart disease   . Paroxysmal atrial fibrillation (HCC)    a. discovered  on PPM interrogation 12/15, CHAD2VASC score is 6. b. decompensation with dCHF in 06/2015 possibly due to AF, s/p DCCV.  . SSS (sick sinus syndrome) ([place])    a. s/p Medtronic PPM by JA for SSS and syncope 9/11.    Past Surgical History:  Procedure Laterality Date  . CARDIAC CATHETERIZATION  07/05/2009   EF 55-60%  . CARDIOVERSION N/A 06/11/2015   Procedure: CARDIOVERSION;  Surgeon: [name], MD;  Location: [facility] ENDOSCOPY;  Service: Cardiovascular;  Laterality: N/A;  . CHOLECYSTECTOMY N/A 09/14/2013   Procedure: LAPAROSCOPIC CHOLECYSTECTOMY  WITH INTRAOPERATIVE CHOLANGIOGRAM;  Surgeon: [name], MD;  Location: [facility];  Service: General;  Laterality: N/A;  . COLONOSCOPY    . ERCP N/A 09/13/2013   Procedure: ENDOSCOPIC RETROGRADE CHOLANGIOPANCREATOGRAPHY (ERCP);  Surgeon: [name], MD;  Location: [facility] ENDOSCOPY;  Service: Endoscopy;  Laterality: N/A;  . INSERT / REPLACE / REMOVE PACEMAKER  9/11   SSS and syncope, implanted by JA (MDT)  . MASTECTOMY  1992   BILATERAL WITH RECONSTRUCTION     Medications: Current Meds  Medication Sig  . apixaban (ELIQUIS) 5 MG TABS tablet Take 1 tablet (5 mg total) by mouth 2 (two) times daily.  Marland Kitchen b complex vitamins tablet Take 1 tablet by mouth daily.  . blood glucose meter kit and supplies KIT Dispense based on patient and insurance preference. Test sugars once daily and prn as directed. (FOR ICD-9 250.00, 250.01).  . Calcium Citrate-Vitamin D (CALCIUM CITRATE + D PO) Take 1 tablet by mouth daily with breakfast.  . carvedilol (COREG) 12.5 MG tablet Take 1 tablet (12.5 mg total) by mouth 2 (two) times daily.  Marland Kitchen docusate sodium (COLACE) 100 MG capsule Take 100 mg by mouth at bedtime.  Marland Kitchen ECHINACEA PO Take 750 mg by mouth daily.  . fish oil-omega-3 fatty acids 1000 MG capsule Take 1 g by mouth every morning.   . furosemide (LASIX) 20 MG tablet Take 2 tablets by mouth every morning and one tablet every afternoon  . gabapentin (NEURONTIN) 100 MG capsule TAKE 300MG BY MOUTH IN THE MORNING AND 400MG AT NIGHT  . gabapentin (NEURONTIN) 300 MG capsule TAKE 1 CAPSULE BY MOUTH TWICE DAILY  . Misc. Devices (ROLLATOR ULTRA-LIGHT) MISC With seat.  Diabetic neuropathy, knee arthritis  . nitroGLYCERIN (NITROSTAT) 0.4 MG SL tablet Place 1 tablet (0.4 mg total) under the tongue every 5 (five) minutes as needed for chest pain. x3 doses as needed for chest pain  . potassium chloride SA (K-DUR,KLOR-CON) 20 MEQ tablet Take 1 tablet (20 mEq total) by mouth daily.  . rosuvastatin (CRESTOR) 5 MG tablet Take 1  tablet (5 mg total) by mouth at bedtime.     Allergies: Allergies  Allergen Reactions  . Lyrica [Pregabalin] Swelling    Caused weight gain  . Amlodipine Swelling  . Sulfonamide Derivatives Rash    Social History: The patient  reports that  has never smoked. she has never used smokeless tobacco. She reports that she does not drink alcohol or use drugs.   Family History: The patient's family history includes Cancer in her father and mother.   Review of Systems: Please see the history of present illness.   Otherwise, the review of systems is positive for none.   All other systems are reviewed and negative.   Physical Exam: VS:  BP 140/84 (BP Location: Left Arm, Patient Position: Sitting, Cuff Size: Normal)   Pulse 85   Ht _0  (1.6 m)   Wt  148 lb (67.1 kg)   LMP  (LMP Unknown)   SpO2 95%   BMI 26.22 kg/m  .  BMI Body mass index is 26.22 kg/m.  Wt Readings from Last 3 Encounters:  01/08/18 148 lb (67.1 kg)  11/06/17 150 lb (68 kg)  11/02/17 149 lb 7.6 oz (67.8 kg)    General: Pleasant. Little hard of hearing. Elderly female who is alert and in no acute distress.  HEENT: Normal.  Neck: Supple, no JVD, carotid bruits, or masses noted.  Cardiac: Fairly regular - presumed paced. No edema.  Respiratory:  Lungs are clear to auscultation bilaterally with normal work of breathing.  GI: Soft and nontender.  MS: No deformity or atrophy. Gait and ROM intact.  Skin: Warm and dry. Color is pale.   Neuro:  Strength and sensation are intact and no gross focal deficits noted.  Psych: Alert, appropriate and with normal affect.   LABORATORY DATA:  EKG:  EKG is not ordered today.  Lab Results  Component Value Date   WBC 6.7 10/30/2017   HGB 12.7 10/30/2017   HCT 39.1 10/30/2017   PLT 261 10/30/2017   GLUCOSE 107 (H) 11/06/2017   CHOL 101 06/09/2015   TRIG 52 06/09/2015   HDL 38 (L) 06/09/2015   LDLDIRECT 152.0 10/10/2013   LDLCALC 53 06/09/2015   ALT 20 10/30/2017   AST  26 10/30/2017   NA 141 11/06/2017   K 4.5 11/06/2017   CL 102 11/06/2017   CREATININE 1.12 (H) 11/06/2017   BUN 20 11/06/2017   CO2 25 11/06/2017   TSH 3.500 02/11/2017   INR 0.95 07/15/2010   HGBA1C 6.5 10/14/2017     BNP (last 3 results) Recent Labs    03/28/17 1312 10/30/17 2259  BNP 772.5* 3,496.2*    ProBNP (last 3 results) Recent Labs    10/14/17 1641 10/19/17 1509 11/06/17 1603  PROBNP 2,662.0* 1,542.0* 12,294*   Other Studies Reviewed Today:  Echo Study Conclusions 02/2017  - Left ventricle: The cavity size was normal. Wall thickness was   normal. Systolic function was moderately to severely reduced. The   estimated ejection fraction was in the range of 30% to 35%. - Aortic valve: Mildly calcified annulus. Mildly thickened, mildly   calcified leaflets. There was mild regurgitation. - Mitral valve: There was mild to moderate regurgitation. - Right ventricle: Systolic function was mildly to moderately   reduced. - Tricuspid valve: There was mild-moderate regurgitation. - Pulmonary arteries: Systolic pressure was moderately increased.   PA peak pressure: 46 mm Hg (S).  CARDIAC CATH FINDINGS 07/2009:   LV showed good LV systolic function.  There was 3+ catheter-  induced MR.  EF of 55-60%.  Left main was patent proximally and had 15-  20% distal stenosis.  LAD has 15-20% proximal stenosis and 30% proximal  bifurcation stenosis with diagonal-1.  Diagonal-1 had 60% ostial  stenosis.  Ramus was very small, which was patent.  Left circumflex has  10-15% ostial stenosis and it tapers down in the AV groove after giving  off OM-2.  OM-1 is very, very small.  OM-2 was moderate size, which was  patent.  RCA has 10-15% proximal stenosis and 70-75% sequential mid  stenosis and 70-75% focal distal stenosis with TIMI grade 3 distal flow.   INTERVENTIONAL PROCEDURE:  Successful PTCA to distal RCA and proximal  RCA was done using 2.5 x 12-mm long [place] Voyager balloon for  pre-  dilatation and then 2.5 x 15-mm long Xience  V stent was deployed in  distal RCA at 11 atmospheric pressure.  Stent was post-dilated using  2.75 x 12-mm long [place] Voyager balloon going up to 18 atmospheric pressure  and then 3.5 x 28 mm long Xience V stent was deployed at 11 atmospheric  pressure in mid RCA.  Stent was post-dilated using 3.75 x 20 mm long [place]  Voyager balloon going up to 18 and 20 atmospheric pressure.  Lesions  were dilated from 70-75% to 0% with excellent TIMI grade 3 distal flow  without evidence of dissection or distal embolization.  The patient  received weight-based Angiomax and 600 mg of Plavix during the  procedure.  The patient did not have any episodes of marked sinus  bradycardia or pauses during the procedure.  The patient tolerated the  procedure well.  There were no complications.  The patient was  transferred to recovery room in stable condition.    Assessment/Plan:  1.  Acute on chronic combined diastolic and systolic HF -  - BNP 78,242 in January 2019, currently euvolemic on Lasix 40 morning and 20 in the afternoon, will continue, will recheck BMP and BNP today and adjust potassium supplements. Follow-up in 3 months.  2. CM with EF 30-35% pt and daughter did not wish to have stress testing or cardiac cath. They favor conservative management.   3.  CKD - not on ACE/ARB, creatinine stable at 1.1, we'll recheck today.  4.  HTN - controlled.  5.  Persistent AF - managed with PPM in place, rate control and anticoagulation. She has no bleeding, most recent hemoglobin 12.7.  6. Chronic anticoagulation with Eliquis - as above.  7. PPM with SSS - she saw Dr. [name] in November and her pacemaker was functioning properly.  Current medicines are reviewed with the patient today.  The patient does not have concerns regarding medicines other than what has been noted above.  The following changes have been made:  See above.  Labs/ tests ordered today  include:    Orders Placed This Encounter  Procedures  . Basic Metabolic Panel (BMET)  . Pro b natriuretic peptide     Disposition:   FU with Dr. [name] and her team. Advised high dose flu vaccine - we did have this available and this was given today.   Patient is agreeable to this plan and will call if any problems develop in the interim.   Signed: Ena Dawley, MD  01/08/2018 12:26 PM  [facility] [address] [place], [place]  [address] Phone: [phone] Fax: [phone]

## 2018-01-09 LAB — BASIC METABOLIC PANEL
BUN/Creatinine Ratio: 19 (ref 12–28)
BUN: 23 mg/dL (ref 8–27)
CO2: 25 mmol/L (ref 20–29)
Calcium: 10.2 mg/dL (ref 8.7–10.3)
Chloride: 106 mmol/L (ref 96–106)
Creatinine, Ser: 1.19 mg/dL — ABNORMAL HIGH (ref 0.57–1.00)
GFR calc Af Amer: 47 mL/min/{1.73_m2} — ABNORMAL LOW (ref >59)
GFR calc non Af Amer: 41 mL/min/{1.73_m2} — ABNORMAL LOW (ref >59)
Glucose: 116 mg/dL — ABNORMAL HIGH (ref 65–99)
Potassium: 4.2 mmol/L (ref 3.5–5.2)
Sodium: 145 mmol/L — ABNORMAL HIGH (ref 134–144)

## 2018-01-09 LAB — PRO B NATRIURETIC PEPTIDE: NT-Pro BNP: 10443 pg/mL — ABNORMAL HIGH (ref 0–738)

## 2018-01-19 DIAGNOSIS — Z7901 Long term (current) use of anticoagulants: Secondary | ICD-10-CM | POA: Diagnosis not present

## 2018-01-19 DIAGNOSIS — Z950 Presence of cardiac pacemaker: Secondary | ICD-10-CM | POA: Diagnosis not present

## 2018-01-19 DIAGNOSIS — I2510 Atherosclerotic heart disease of native coronary artery without angina pectoris: Secondary | ICD-10-CM

## 2018-01-19 DIAGNOSIS — I130 Hypertensive heart and chronic kidney disease with heart failure and stage 1 through stage 4 chronic kidney disease, or unspecified chronic kidney disease: Secondary | ICD-10-CM | POA: Diagnosis not present

## 2018-01-19 DIAGNOSIS — N183 Chronic kidney disease, stage 3 (moderate): Secondary | ICD-10-CM

## 2018-01-19 DIAGNOSIS — E1122 Type 2 diabetes mellitus with diabetic chronic kidney disease: Secondary | ICD-10-CM | POA: Diagnosis not present

## 2018-01-19 DIAGNOSIS — E876 Hypokalemia: Secondary | ICD-10-CM | POA: Diagnosis not present

## 2018-01-19 DIAGNOSIS — I482 Chronic atrial fibrillation: Secondary | ICD-10-CM | POA: Diagnosis not present

## 2018-01-19 DIAGNOSIS — I5043 Acute on chronic combined systolic (congestive) and diastolic (congestive) heart failure: Secondary | ICD-10-CM | POA: Diagnosis not present

## 2018-01-19 DIAGNOSIS — I495 Sick sinus syndrome: Secondary | ICD-10-CM | POA: Diagnosis not present

## 2018-02-02 ENCOUNTER — Other Ambulatory Visit: Payer: Self-pay | Admitting: Internal Medicine

## 2018-02-18 ENCOUNTER — Ambulatory Visit: Payer: Medicare Other | Admitting: Podiatry

## 2018-02-18 DIAGNOSIS — B351 Tinea unguium: Secondary | ICD-10-CM

## 2018-02-18 DIAGNOSIS — E1140 Type 2 diabetes mellitus with diabetic neuropathy, unspecified: Secondary | ICD-10-CM

## 2018-02-18 DIAGNOSIS — M79676 Pain in unspecified toe(s): Secondary | ICD-10-CM | POA: Diagnosis not present

## 2018-02-19 ENCOUNTER — Ambulatory Visit: Payer: Medicare Other | Admitting: Podiatry

## 2018-02-22 NOTE — Progress Notes (Signed)
Subjective: 84 y.o. returns the office today for painful, elongated, thickened toenails which she cannot trim herself. Denies any redness or drainage around the nails.  She continues to gabapentin for neuropathy.  She still getting some burning and pain to her toes and there asked about possibly increasing the dose of gabapentin.  Denies any acute changes since last appointment and no new complaints today. Denies any systemic complaints such as fevers, chills, nausea, vomiting.   Objective: AAO 3, NAD DP/PT pulses palpable, CRT less than 3 seconds  Sensation decreased with Simms Weinstein monofilament. Nails hypertrophic, dystrophic, elongated, brittle, discolored 10. There is tenderness overlying the nails 1-5 bilaterally. There is no surrounding erythema or drainage along the nail sites.  No open lesions or pre-ulcerative lesions are identified. No other areas of tenderness bilateral lower extremities. No overlying edema, erythema, increased warmth. No pain with calf compression, swelling, warmth, erythema.  Assessment: Patient presents with symptomatic onychomycosis; neuropathy  Plan: -Treatment options including alternatives, risks, complications were discussed -Nails sharply debrided 10 without complication/bleeding. -She currently takes 400 mg of gabapentin once a day and 300 mg once a day.  She has been at the same dose now for a couple of years without any modifications.  She started getting some more burning to her feet.  Will discuss with Dr. Quay Burow about potentially increasing the dose of this. -Discussed daily foot inspection. If there are any changes, to call the office immediately.  -Follow-up in 3 months or sooner if any problems are to arise. In the meantime, encouraged to call the office with any questions, concerns, changes symptoms.  Celesta Gentile, DPM

## 2018-02-23 ENCOUNTER — Other Ambulatory Visit: Payer: Self-pay

## 2018-02-23 DIAGNOSIS — E860 Dehydration: Secondary | ICD-10-CM

## 2018-02-23 DIAGNOSIS — I110 Hypertensive heart disease with heart failure: Secondary | ICD-10-CM

## 2018-02-23 MED ORDER — CARVEDILOL 12.5 MG PO TABS: 12.5000 mg | ORAL_TABLET | Freq: Two times a day (BID) | ORAL | 2 refills | Status: DC

## 2018-02-25 ENCOUNTER — Telehealth: Payer: Self-pay | Admitting: *Deleted

## 2018-02-25 ENCOUNTER — Telehealth: Payer: Self-pay | Admitting: Emergency Medicine

## 2018-02-25 NOTE — Telephone Encounter (Signed)
Called patient to schedule AWV. Patient declined at this time. 

## 2018-02-25 NOTE — Telephone Encounter (Addendum)
Dr. Jacqualyn Posey states pt may Gabapentin 300mg  bid and 400mg  hs. Left message for pt to call office for orders.

## 2018-02-25 NOTE — Telephone Encounter (Signed)
Pt's dtr, [name] asked for the direction on pt's gabapentin. I told [name] Dr. Jacqualyn Posey ordered Gabapentin 300mg  twice daily and 400mg  at bedtime. I informed pt's dtr.

## 2018-03-03 ENCOUNTER — Ambulatory Visit (INDEPENDENT_AMBULATORY_CARE_PROVIDER_SITE_OTHER): Payer: Medicare Other | Admitting: *Deleted

## 2018-03-03 DIAGNOSIS — I495 Sick sinus syndrome: Secondary | ICD-10-CM

## 2018-03-04 ENCOUNTER — Encounter: Payer: Self-pay | Admitting: Cardiology

## 2018-03-04 LAB — CUP PACEART REMOTE DEVICE CHECK
Battery Remaining Longevity: 70 mo
Battery Voltage: 2.79 V
Brady Statistic AP VS Percent: 0 %
Brady Statistic AS VP Percent: 3 %
Brady Statistic AS VS Percent: 94 %
Date Time Interrogation Session: 20190501180937
Implantable Lead Implant Date: 20110912
Implantable Lead Location: 753860
Implantable Lead Model: 5076
Lead Channel Impedance Value: 643 Ohm
Lead Channel Pacing Threshold Amplitude: 0.75 V
Lead Channel Pacing Threshold Pulse Width: 0.4 ms
Lead Channel Pacing Threshold Pulse Width: 0.4 ms
MDC IDC LEAD IMPLANT DT: 20110912
MDC IDC LEAD LOCATION: 753859
MDC IDC MSMT BATTERY IMPEDANCE: 758 Ohm
MDC IDC MSMT LEADCHNL RA IMPEDANCE VALUE: 480 Ohm
MDC IDC MSMT LEADCHNL RV PACING THRESHOLD AMPLITUDE: 0.625 V
MDC IDC PG IMPLANT DT: 20110912
MDC IDC SET LEADCHNL RA PACING AMPLITUDE: 2 V
MDC IDC SET LEADCHNL RV PACING AMPLITUDE: 2.5 V
MDC IDC SET LEADCHNL RV PACING PULSEWIDTH: 0.4 ms
MDC IDC SET LEADCHNL RV SENSING SENSITIVITY: 2 mV
MDC IDC STAT BRADY AP VP PERCENT: 3 %

## 2018-03-04 NOTE — Progress Notes (Signed)
Remote pacemaker transmission.   

## 2018-03-18 IMAGING — DX DG CHEST 2V
2 series · 2 of 2 positions shown · non-contrast
Comparison: 03/28/2017

CLINICAL DATA: Confusion x 7 days. Hx of HTN, DM, CHF, pacemaker,
CAD, breast ca.

EXAM:
CHEST  2 VIEW

[chest pa]
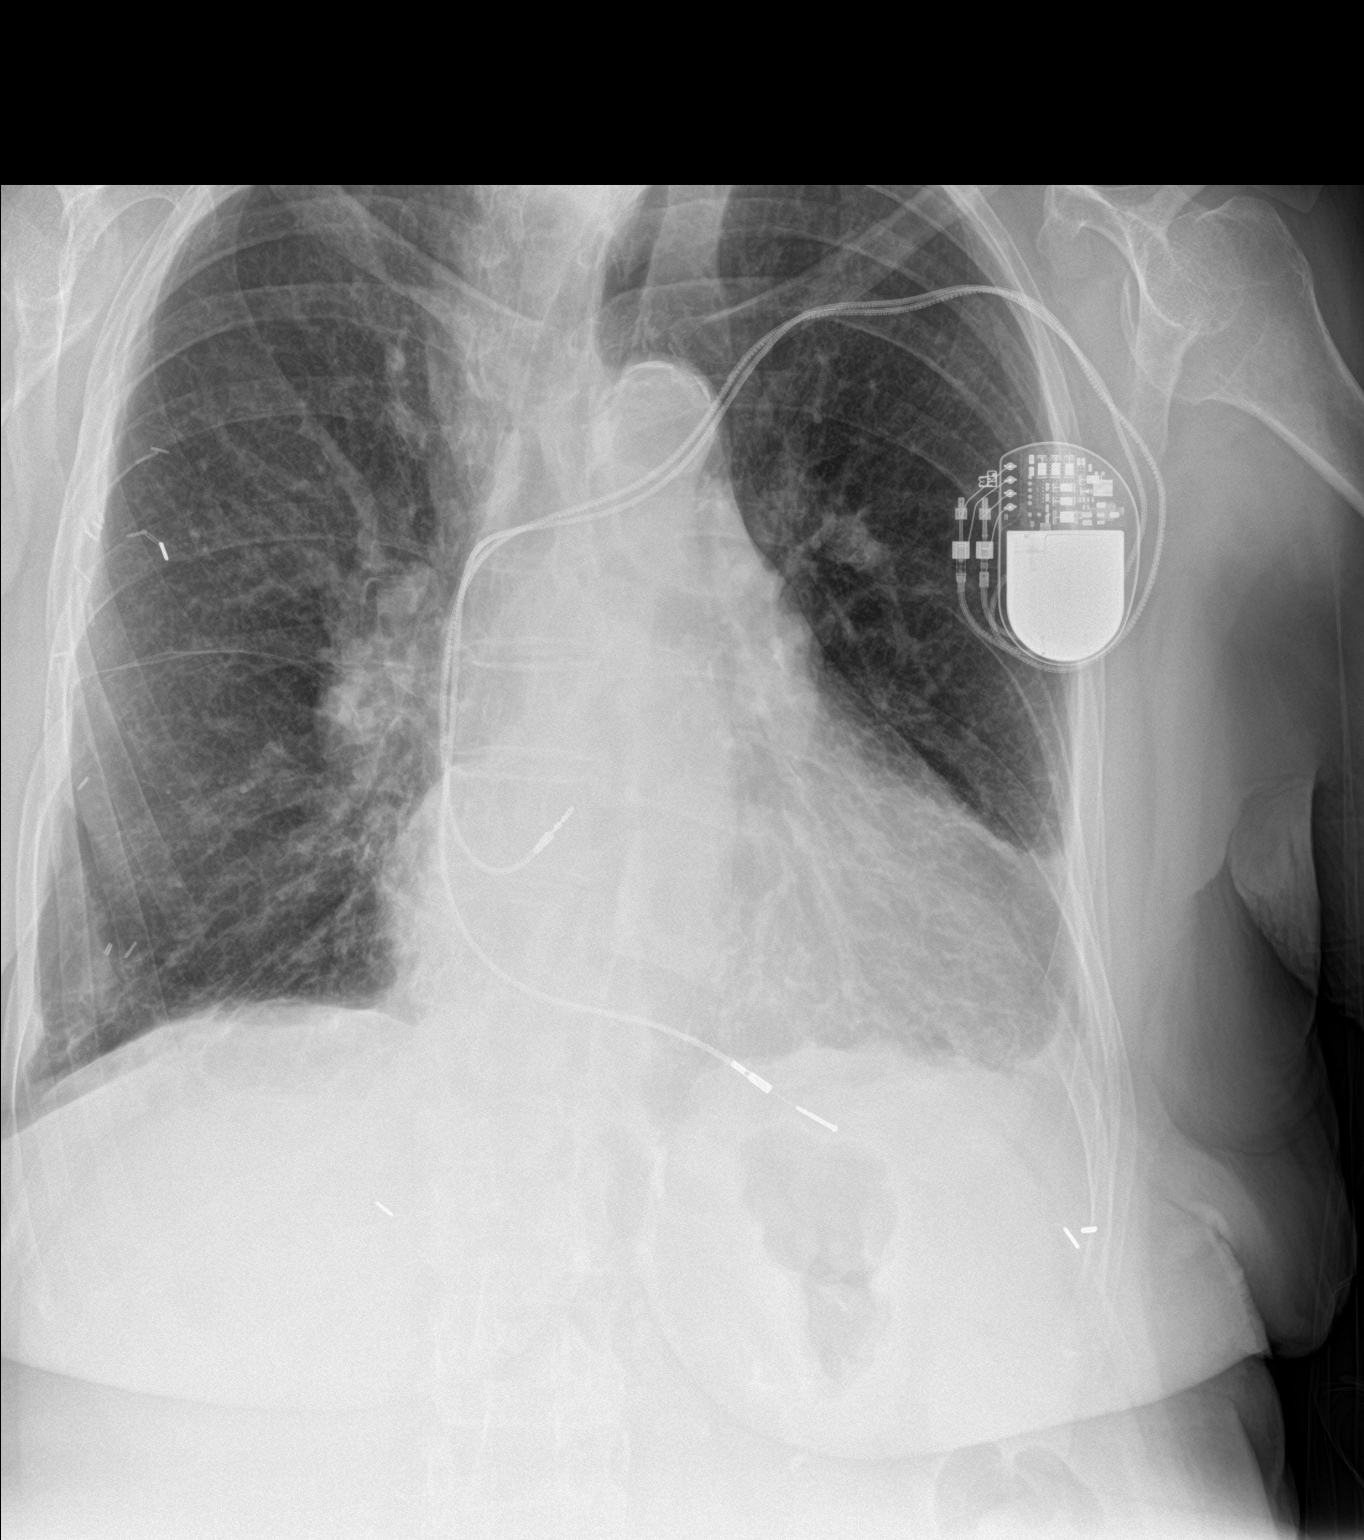

[chest lat]
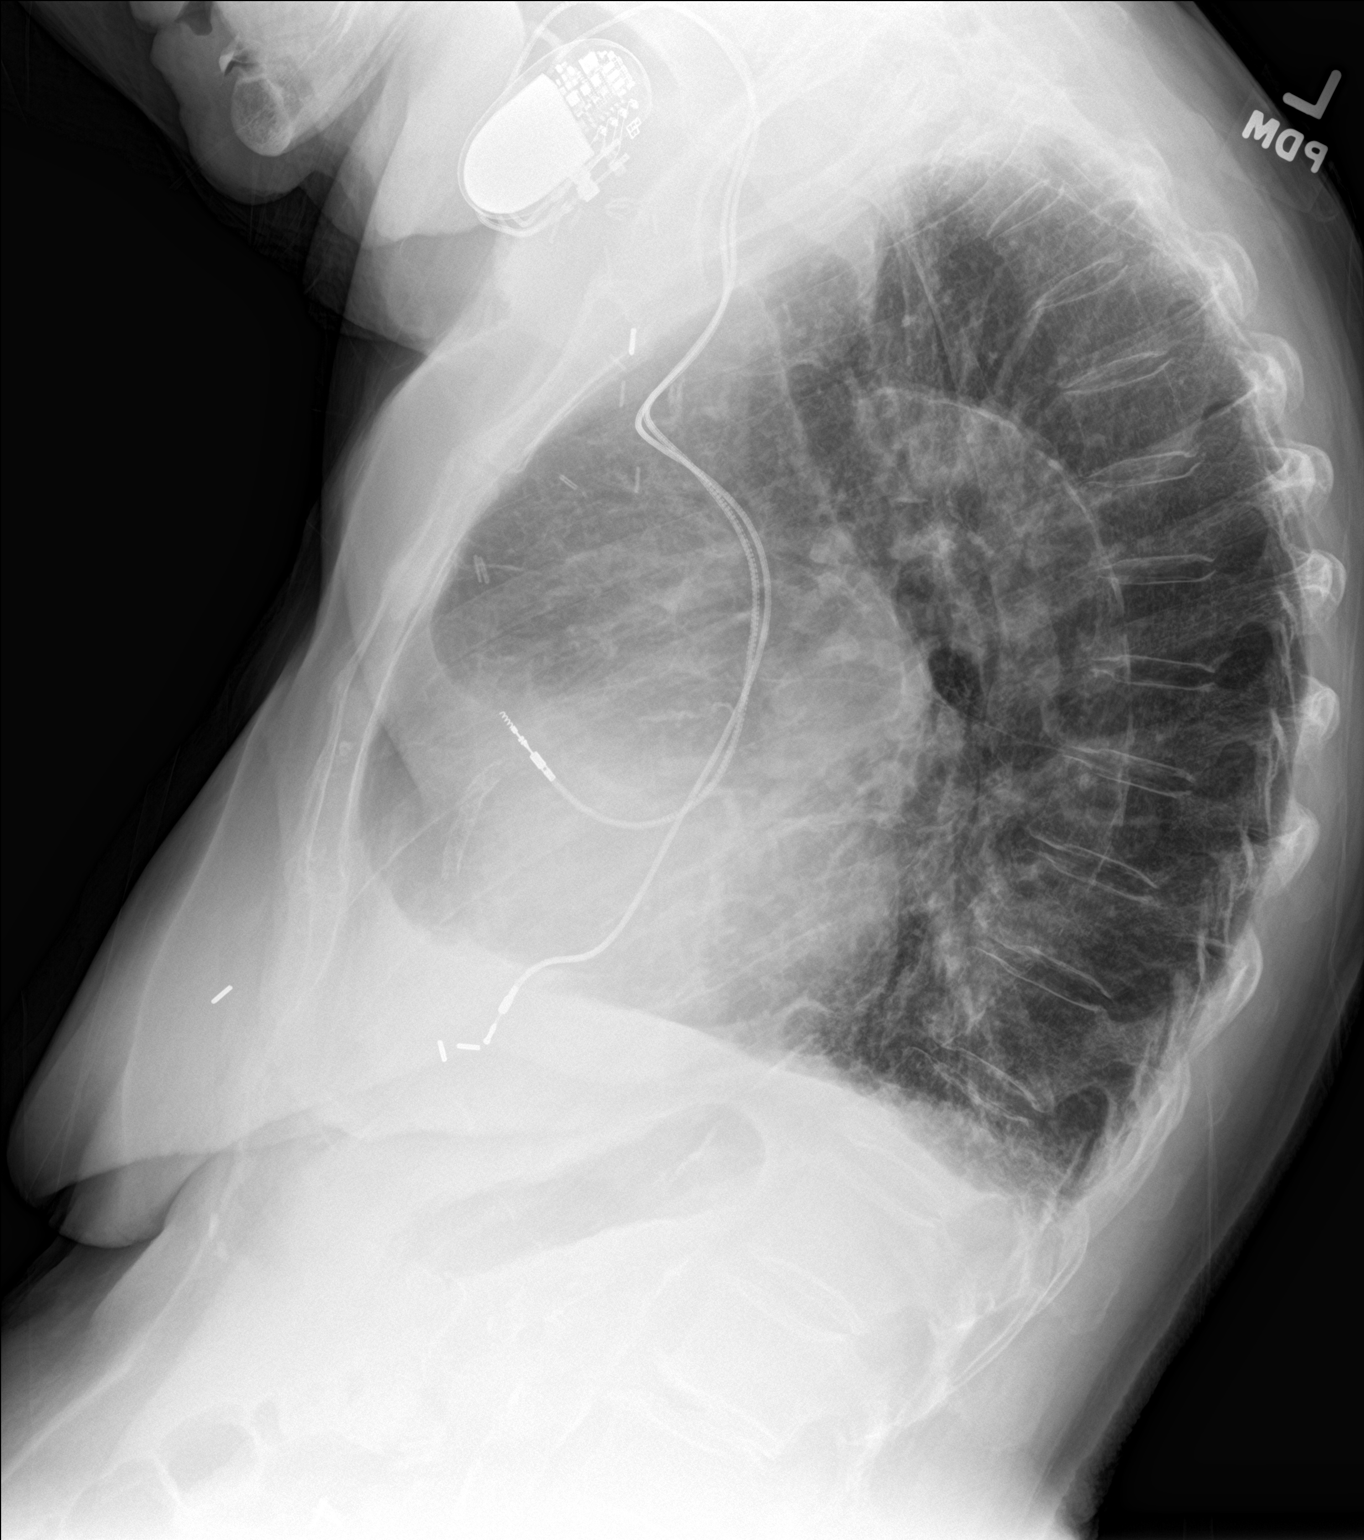

[2 of 2 positions shown; findings below may reference images not displayed]

FINDINGS: LEFT-sided pacemaker overlies normal cardiac silhouette. Small LEFT
effusion. The lungs are hyperinflated. No effusion, infiltrate
pneumothorax. Atherosclerotic calcification of the aorta.
IMPRESSION: Hyperinflated lungs.  No acute cardiopulmonary findings.

## 2018-03-29 ENCOUNTER — Encounter: Payer: Self-pay | Admitting: Physician Assistant

## 2018-03-29 NOTE — Progress Notes (Signed)
Cardiology Office Note    Date:  03/30/2018  ID:  Jessica [name], DOB [birth year], MRN [id number] PCP:  Jessica Rail, MD  Cardiologist:  Jessica Dawley, MD   Chief Complaint: f/u CHF  History of Present Illness:  Jessica [name] is a 84 y.o. female with history of CAD (DESx2 to mid and distal RCA in 2010), chronic combined CHF, hypertensive heart disease, DM with peripheral neuropathy, HTN, HLD, SSS s/p Medtronic pacemaker 2011, permanent atrial fib (discovered on device interrogation 10/2014), CKD stage III, moderate pulm HTN (by last echo, difficulty complying with fluid restriction who presents f/u of CHF.  She was hospitalized for acute diastolic CHF in 05/961 in the setting of accelerated HTN and PAF. She underwent DCCV at that time. Echo 06/2015: EF 55-60%, no RWMA, mild AI, mild-mod MR, mild LAE/RAE, mildly reduced RV function, mod TR, PASP 67mHg. She was managed at the end of 2017 with worsening CHF in the setting of repeated noncompliance with fluid restriction (up to 4L of water per day). Pacer interrogation at that time showed very low AF burden of 0.6%. Her medication were adjusted. In 2018, her device interrogation 12/03/16 showed increased spike in atrial fib burden to 54.8% and she required additional diuresis for CHF. Her subsequent device interrogations show 100% AT/AF burden. This has been followed by Dr. ARayann Hemanand it appears a rate control strategy was undertaken.There has been prior issue trying to find a balance between euvolemia and renal function/blood pressure. She was admitted in 10/2017 for acute on chronic combined CHF with new cardiomyopathy with EF 30-35%, moderate diffuse HK, mild AI, mild MR, mildly increased PASP. The patient and daughter wished for conservative management instead of cath. Labs 01/2018 showed Cr 1.19, Na 145, K 23, BNP 10,443; otherwise recent labs 2018 LFTs wnl, Hgb 12.7. She was felt to be euvolemic and Lasix was continued at 468mQAM/2046mPM.  She  returns for follow-up with daughter [name] general they feel she is doing well. Her major complaint is neuropathic pain in her feet for which her gabapentin was increased recently with some improvement. Her legs swell from time to time but in general they feel she's doing well. She denies any SOB. She has been drinking 4-5 16oz bottles of water per day.    Past Medical History:  Diagnosis Date  . Breast cancer ([place] . CAD (coronary artery disease)    a. s/p DESx2 to mid and distal RCA in 2010.  . CMarland Kitchenolelithiasis 09/12/2013   Lap Chole on 09/14/13   . Chronic combined systolic and diastolic CHF (congestive heart failure) ([place]  a. Echo 06/09/15: EF 55-60% -> 10/2017 EF 30-35% (pt and daughter did not wish to proceed with ischemic assessment - conservative rx).  . CKD (chronic kidney disease), stage III ([place] . Diabetes mellitus    NON INSULIN DEPENDENT  . DJD (degenerative joint disease)   . GERD (gastroesophageal reflux disease)   . Hemorrhoids   . History of diabetic neuropathy   . Hypercholesterolemia   . Hypertension   . Hypertensive heart disease   . Persistent atrial fibrillation ([place]  a. discovered on PPM interrogation 12/15, CHAD2VASC score is 6. b. decompensation with dCHF in 06/2015 possibly due to AF, s/p DCCV.. cMarland Kitchen 2018-2019 increase in burden.  . SSS (sick sinus syndrome) ([facility]  a. s/p Medtronic PPM by Jessica [name] for SSS and syncope 9/11.    Past Surgical History:  Procedure  Laterality Date  . CARDIAC CATHETERIZATION  07/05/2009   EF 55-60%  . CARDIOVERSION N/A 06/11/2015   Procedure: CARDIOVERSION;  Surgeon: Jessica Spark, MD;  Location: [facility] ENDOSCOPY;  Service: Cardiovascular;  Laterality: N/A;  . CHOLECYSTECTOMY N/A 09/14/2013   Procedure: LAPAROSCOPIC CHOLECYSTECTOMY WITH INTRAOPERATIVE CHOLANGIOGRAM;  Surgeon: Jessica Faster. Cornett, MD;  Location: [place];  Service: General;  Laterality: N/A;  . COLONOSCOPY    . ERCP N/A 09/13/2013   Procedure: ENDOSCOPIC RETROGRADE  CHOLANGIOPANCREATOGRAPHY (ERCP);  Surgeon: Jessica Beams, MD;  Location: [facility] ENDOSCOPY;  Service: Endoscopy;  Laterality: N/A;  . INSERT / REPLACE / REMOVE PACEMAKER  9/11   SSS and syncope, implanted by Jessica [name] (MDT)  . MASTECTOMY  1992   BILATERAL WITH RECONSTRUCTION    Current Medications: Current Meds  Medication Sig  . apixaban (ELIQUIS) 5 MG TABS tablet Take 1 tablet (5 mg total) by mouth 2 (two) times daily.  Marland Kitchen b complex vitamins tablet Take 1 tablet by mouth daily.  . blood glucose meter kit and supplies KIT Dispense based on patient and insurance preference. Test sugars once daily and prn as directed. (FOR ICD-9 250.00, 250.01).  . Calcium Citrate-Vitamin D (CALCIUM CITRATE + D PO) Take 1 tablet by mouth daily with breakfast.  . carvedilol (COREG) 12.5 MG tablet Take 1 tablet (12.5 mg total) by mouth 2 (two) times daily.  Marland Kitchen docusate sodium (COLACE) 100 MG capsule Take 100 mg by mouth at bedtime.  Marland Kitchen ECHINACEA PO Take 750 mg by mouth daily.  . fish oil-omega-3 fatty acids 1000 MG capsule Take 1 g by mouth every morning.   . furosemide (LASIX) 20 MG tablet Take 2 tablets by mouth every morning and one tablet every afternoon  . gabapentin (NEURONTIN) 100 MG capsule TAKE 300MG BY MOUTH IN THE MORNING AND 400MG AT NIGHT  . gabapentin (NEURONTIN) 300 MG capsule Take 300 mg by mouth 3 (three) times daily.  Marland Kitchen KLOR-CON M20 20 MEQ tablet TAKE 1 TABLET BY MOUTH ONCE DAILY  . Misc. Devices (ROLLATOR ULTRA-LIGHT) MISC With seat.  Diabetic neuropathy, knee arthritis  . nitroGLYCERIN (NITROSTAT) 0.4 MG SL tablet Place 1 tablet (0.4 mg total) under the tongue every 5 (five) minutes as needed for chest pain. x3 doses as needed for chest pain    Allergies:   Lyrica [pregabalin]; Amlodipine; and Sulfonamide derivatives   Social History   Socioeconomic History  . Marital status: Widowed    Spouse name: Not on file  . Number of children: Not on file  . Years of education: Not on file  . Highest  education level: Not on file  Occupational History  . Not on file  Social Needs  . Financial resource strain: Not on file  . Food insecurity:    Worry: Not on file    Inability: Not on file  . Transportation needs:    Medical: Not on file    Non-medical: Not on file  Tobacco Use  . Smoking status: Never Smoker  . Smokeless tobacco: Never Used  Substance and Sexual Activity  . Alcohol use: No    Alcohol/week: 0.0 oz  . Drug use: No  . Sexual activity: Not on file  Lifestyle  . Physical activity:    Days per week: Not on file    Minutes per session: Not on file  . Stress: Not on file  Relationships  . Social connections:    Talks on phone: Not on file    Gets together: Not on  file    Attends religious service: Not on file    Active member of club or organization: Not on file    Attends meetings of clubs or organizations: Not on file    Relationship status: Not on file  Other Topics Concern  . Not on file  Social History Narrative  . Not on file     Family History:  The patient's family history includes Cancer in her father and mother.  ROS:   Please see the history of present illness. No chest pain All other systems are reviewed and otherwise negative.    PHYSICAL EXAM:   VS:  BP 138/68   Pulse 70   Ht 5' 3"  (1.6 m)   Wt 149 lb (67.6 kg)   LMP  (LMP Unknown)   BMI 26.39 kg/m   BMI: Body mass index is 26.39 kg/m. GEN: Well nourished, well developed elderly WF, in no acute distress HEENT: normocephalic, atraumatic Neck: no JVD, carotid bruits, or masses Cardiac: RRR; no murmurs, rubs, or gallops, no edema  Respiratory:  clear to auscultation bilaterally, normal work of breathing GI: rounded but soft, nontender, + BS MS: no deformity or atrophy Skin: warm and dry, no rash Neuro:  Alert and Oriented x 3, Strength and sensation are intact, follows commands Psych: euthymic mood, full affect  Wt Readings from Last 3 Encounters:  03/30/18 149 lb (67.6 kg)    01/08/18 148 lb (67.1 kg)  11/06/17 150 lb (68 kg)      Studies/Labs Reviewed:   EKG:  EKG was ordered today and personally reviewed by me and demonstrates V paced rhythm 70bpm, TWI avF  Recent Labs: 10/30/2017: ALT 20; B Natriuretic Peptide 3,496.2; Hemoglobin 12.7; Platelets 261 11/02/2017: Magnesium 2.4 01/08/2018: BUN 23; Creatinine, Ser 1.19; NT-Pro BNP 10,443; Potassium 4.2; Sodium 145   Lipid Panel    Component Value Date/Time   CHOL 101 06/09/2015 0330   TRIG 52 06/09/2015 0330   HDL 38 (L) 06/09/2015 0330   CHOLHDL 2.7 06/09/2015 0330   VLDL 10 06/09/2015 0330   LDLCALC 53 06/09/2015 0330   LDLDIRECT 152.0 10/10/2013 1619    Additional studies/ records that were reviewed today include: Summarized above.    ASSESSMENT & PLAN:   1. Chronic combined CHF - f/u BNP in 01/2018 remained elevated but as she was fairly asymptomatic, Dr. [name] recommended continuing current regimen. She and her daughter feel she's overall doing well. I met her several years ago and she had significant edema on exam whereas now so that is not the case. Would continue carvedilol and Lasix as prescribed. It appears that lisinopril and spironolactone were stopped in 2018 in the setting of AKI during an episode of dehydration. Her kidney function improved has remained fairly stable since that time. Will check BMET today. I would favor re-adding lisinopril at lower dose of 25m daily with f/u BMET in 1 week. Reviewed 2g sodium restriction, fluid restriction, daily weights with patient. I actually advised cutting her fluid back to 48oz per day as she tends to go overboard on fluid intake, which would only promote further difficulty finding balance between heart and renal failure. She also has f/u PCP mid June which would be good time to recheck BP. 2. Permanent atrial fib - rate controlled. Continue apixaban. 3. CAD - no recent angina. Suspect not on ASA due to concomitant apixaban. 4. CKD III - follow as  above.  Disposition: F/u with Dr. NMeda Coffeein 3 months.   Medication Adjustments/Labs  and Tests Ordered: Current medicines are reviewed at length with the patient today.  Concerns regarding medicines are outlined above. Medication changes, Labs and Tests ordered today are summarized above and listed in the Patient Instructions accessible in Encounters.   Signed, Charlie Pitter, PA-C  03/30/2018 3:31 PM    [facility] [address], [place], [place]  [address] Phone: [phone]; Fax: [phone]

## 2018-03-30 ENCOUNTER — Telehealth: Payer: Self-pay | Admitting: Cardiology

## 2018-03-30 ENCOUNTER — Ambulatory Visit (INDEPENDENT_AMBULATORY_CARE_PROVIDER_SITE_OTHER): Payer: Medicare Other | Admitting: Physician Assistant

## 2018-03-30 ENCOUNTER — Encounter: Payer: Self-pay | Admitting: Physician Assistant

## 2018-03-30 VITALS — BP 138/68 | HR 70 | Ht 63.0 in | Wt 149.0 lb

## 2018-03-30 DIAGNOSIS — N183 Chronic kidney disease, stage 3 unspecified: Secondary | ICD-10-CM

## 2018-03-30 DIAGNOSIS — I482 Chronic atrial fibrillation: Secondary | ICD-10-CM | POA: Diagnosis not present

## 2018-03-30 DIAGNOSIS — I5042 Chronic combined systolic (congestive) and diastolic (congestive) heart failure: Secondary | ICD-10-CM | POA: Diagnosis not present

## 2018-03-30 DIAGNOSIS — I2510 Atherosclerotic heart disease of native coronary artery without angina pectoris: Secondary | ICD-10-CM

## 2018-03-30 DIAGNOSIS — I4821 Permanent atrial fibrillation: Secondary | ICD-10-CM

## 2018-03-30 MED ORDER — LISINOPRIL 5 MG PO TABS: 5.0000 mg | ORAL_TABLET | Freq: Every day | ORAL | 3 refills | Status: DC

## 2018-03-30 NOTE — Patient Instructions (Addendum)
Medication Instructions:  Your physician has recommended you make the following change in your medication:   1. START LISINOPRIL 5 MG DAILY  Labwork: 1. TODAY: BMET  2. BMET IN 1 WEEK   Testing/Procedures: NONE ORDERED   Follow-Up: Your physician recommends that you schedule a follow-up appointment in: [place] DR. [name].   Any Other Special Instructions Will Be Listed Below (If Applicable).     If you need a refill on your cardiac medications before your next appointment, please call your pharmacy.  For patients with congestive heart failure, we give them these special instructions:  1. Follow a low-salt diet - you are allowed no more than 2,000mg  of sodium per day. Watch your fluid intake. Limit this to around 48 ounces per day. This includes sources of water in foods like soup, coffee, tea, milk, etc. 2. Weigh yourself on the same scale at same time of day and keep a log. 3. Call your doctor: (Anytime you feel any of the following symptoms)  - 3lb weight gain overnight or 5lb within a few days - Shortness of breath, with or without a dry hacking cough  - Swelling in the hands, feet or stomach  - If you have to sleep on extra pillows at night in order to breathe   IT IS IMPORTANT TO LET YOUR DOCTOR KNOW EARLY ON IF YOU ARE HAVING SYMPTOMS SO WE CAN HELP YOU!

## 2018-03-30 NOTE — Telephone Encounter (Signed)
New Message    [name] wants you to get the patient in for a follow up in 3 months

## 2018-03-31 LAB — BASIC METABOLIC PANEL
BUN / CREAT RATIO: 18 (ref 12–28)
BUN: 21 mg/dL (ref 8–27)
CALCIUM: 10.4 mg/dL — AB (ref 8.7–10.3)
CO2: 26 mmol/L (ref 20–29)
Chloride: 106 mmol/L (ref 96–106)
Creatinine, Ser: 1.2 mg/dL — ABNORMAL HIGH (ref 0.57–1.00)
GFR, EST AFRICAN AMERICAN: 46 mL/min/{1.73_m2} — AB (ref >59)
GFR, EST NON AFRICAN AMERICAN: 40 mL/min/{1.73_m2} — AB (ref >59)
Glucose: 95 mg/dL (ref 65–99)
Potassium: 4.2 mmol/L (ref 3.5–5.2)
Sodium: 145 mmol/L — ABNORMAL HIGH (ref 134–144)

## 2018-03-31 NOTE — Telephone Encounter (Signed)
Pt is scheduled to see Dr [name] for 07/02/18 at 2:20 pm.  Pt made aware of appt date and time by [facility] scheduling.

## 2018-03-31 NOTE — Telephone Encounter (Signed)
Pt can be seen on 8/30 at 2:20 pm with Dr [name], as [name] PA-C requested.  Will endorse to [facility] scheduling to follow-up with the pt and schedule this appt.

## 2018-04-08 ENCOUNTER — Other Ambulatory Visit: Payer: Medicare Other

## 2018-04-08 DIAGNOSIS — N183 Chronic kidney disease, stage 3 unspecified: Secondary | ICD-10-CM

## 2018-04-09 ENCOUNTER — Other Ambulatory Visit: Payer: Self-pay | Admitting: Cardiology

## 2018-04-09 DIAGNOSIS — I5042 Chronic combined systolic (congestive) and diastolic (congestive) heart failure: Secondary | ICD-10-CM

## 2018-04-09 DIAGNOSIS — I5033 Acute on chronic diastolic (congestive) heart failure: Secondary | ICD-10-CM

## 2018-04-09 LAB — BASIC METABOLIC PANEL
BUN / CREAT RATIO: 21 (ref 12–28)
BUN: 24 mg/dL (ref 8–27)
CO2: 27 mmol/L (ref 20–29)
CREATININE: 1.12 mg/dL — AB (ref 0.57–1.00)
Calcium: 10.4 mg/dL — ABNORMAL HIGH (ref 8.7–10.3)
Chloride: 105 mmol/L (ref 96–106)
GFR calc non Af Amer: 44 mL/min/{1.73_m2} — ABNORMAL LOW (ref >59)
GFR, EST AFRICAN AMERICAN: 50 mL/min/{1.73_m2} — AB (ref >59)
Glucose: 96 mg/dL (ref 65–99)
Potassium: 4.7 mmol/L (ref 3.5–5.2)
Sodium: 144 mmol/L (ref 134–144)

## 2018-04-12 NOTE — Telephone Encounter (Signed)
Eliquis 5mg  refill request received; pt is 84 yrs old, wt-1.12 on 04/08/18, Wt-67.6kg, last seen by [name] on 03/30/18; will send in refill to requested pharmacy.

## 2018-04-13 NOTE — Patient Instructions (Addendum)
  Test(s) ordered today. Your results will be released to [facility] (or called to you) after review, usually within 72hours after test completion. If any changes need to be made, you will be notified at that same time.    Medications reviewed and updated.  No changes recommended at this time.     Please followup in 6 motnhs

## 2018-04-13 NOTE — Progress Notes (Signed)
Subjective:    Patient ID: Jessica [name], female    DOB: January 11, 1927, 84 y.o.   MRN: [id number]  HPI The patient is here for follow up.  Diabetes, diabetic neuropathy: She is taking her medication daily as prescribed. She is compliant with a diabetic diet. She is exercising regularly - walking. She checks her feet daily and denies foot lesions.  She is still dealing with her neuropathy pain.  She is taking the gabapentin as prescribed.  Podiatry did call me and ask about increasing her dose we did increase it slightly.  She still has the pain.  She denies any numbness or tingling in the feet.  She is up-to-date with an ophthalmology examination.   CHF, Afib, Hypertension: She is taking her medication daily. She is compliant with a low sodium diet.  She has some leg edema, but it is controlled.  She denies chest pain, palpitations, daily shortness of breath and regular headaches.  She will experience some shortness of breath when it is very hot out or very humid out.  She is exercising regularly-walking.  Hyperlipidemia: She is taking her medication daily. She is compliant with a low fat/cholesterol diet. She is exercising regularly     Estimated Creatinine Clearance: 31.2 mL/min (A) (by C-G formula based on SCr of 1.12 mg/dL (H)).  Medications and allergies reviewed with patient and updated if appropriate.  Patient Active Problem List   Diagnosis Date Noted  . Pressure injury of skin 10/31/2017  . Permanent atrial fibrillation ([place]) 10/31/2017  . Cardiac pacemaker in situ   . CHF exacerbation ([place]) 10/30/2017  . Confusion 10/14/2017  . Chronic combined systolic and diastolic CHF (congestive heart failure) ([place]) 10/12/2017  . Hypotension 03/28/2017  . Acute on chronic combined systolic and diastolic CHF (congestive heart failure) ([place]) 05/15/2016  . Bilateral leg edema 04/29/2016  . AF (paroxysmal atrial fibrillation) ([place]) 06/11/2015  . CKD (chronic kidney disease) stage 3, GFR 30-59  ml/min (HCC) 06/10/2015  . CAD (coronary artery disease) 06/09/2015  . Diabetic neuropathy ([place]) 07/06/2013  . SSS (sick sinus syndrome) ([place]) 02/10/2013  . Hypertension 07/30/2012  . Pacemaker-Medtronic 07/19/2012  . Benign hypertensive heart disease with heart failure ([place]) 01/28/2012  . Diabetes ([place]) 10/22/2010  . HYPERCHOLESTEROLEMIA 10/22/2010  . Angina pectoris ([place]) 10/22/2010  . DEGENERATIVE JOINT DISEASE 10/22/2010    Current Outpatient Medications on File Prior to Visit  Medication Sig Dispense Refill  . b complex vitamins tablet Take 1 tablet by mouth daily.    . blood glucose meter kit and supplies KIT Dispense based on patient and insurance preference. Test sugars once daily and prn as directed. (FOR ICD-9 250.00, 250.01). 1 each 0  . carvedilol (COREG) 12.5 MG tablet Take 1 tablet (12.5 mg total) by mouth 2 (two) times daily. 180 tablet 2  . docusate sodium (COLACE) 100 MG capsule Take 100 mg by mouth at bedtime.    Marland Kitchen ECHINACEA PO Take 750 mg by mouth daily.    Marland Kitchen ELIQUIS 5 MG TABS tablet TAKE 1 TABLET BY MOUTH TWICE DAILY 180 tablet 3  . fish oil-omega-3 fatty acids 1000 MG capsule Take 1 g by mouth every morning.     . furosemide (LASIX) 20 MG tablet Take 2 tablets by mouth every morning and one tablet every afternoon 90 tablet 6  . gabapentin (NEURONTIN) 100 MG capsule TAKE 300MG BY MOUTH IN THE MORNING AND 400MG AT NIGHT    . gabapentin (NEURONTIN) 300 MG capsule Take 300  mg by mouth 3 (three) times daily.    Marland Kitchen KLOR-CON M20 20 MEQ tablet TAKE 1 TABLET BY MOUTH ONCE DAILY 90 tablet 0  . lisinopril (PRINIVIL,ZESTRIL) 5 MG tablet Take 1 tablet (5 mg total) by mouth daily. 90 tablet 3  . Misc. Devices (ROLLATOR ULTRA-LIGHT) MISC With seat.  Diabetic neuropathy, knee arthritis 1 each 0  . nitroGLYCERIN (NITROSTAT) 0.4 MG SL tablet Place 1 tablet (0.4 mg total) under the tongue every 5 (five) minutes as needed for chest pain. x3 doses as needed for chest pain 25 tablet 0  .  rosuvastatin (CRESTOR) 5 MG tablet Take 1 tablet (5 mg total) by mouth at bedtime. 90 tablet 3   No current facility-administered medications on file prior to visit.     Past Medical History:  Diagnosis Date  . Breast cancer ([place])   . CAD (coronary artery disease)    a. s/p DESx2 to mid and distal RCA in 2010.  Marland Kitchen Cholelithiasis 09/12/2013   Lap Chole on 09/14/13   . Chronic combined systolic and diastolic CHF (congestive heart failure) ([place])    a. Echo 06/09/15: EF 55-60% -> 10/2017 EF 30-35% (pt and daughter did not wish to proceed with ischemic assessment - conservative rx).  . CKD (chronic kidney disease), stage III ([place])   . Diabetes mellitus    NON INSULIN DEPENDENT  . DJD (degenerative joint disease)   . GERD (gastroesophageal reflux disease)   . Hemorrhoids   . History of diabetic neuropathy   . Hypercholesterolemia   . Hypertension   . Hypertensive heart disease   . Persistent atrial fibrillation ([place])    a. discovered on PPM interrogation 12/15, CHAD2VASC score is 6. b. decompensation with dCHF in 06/2015 possibly due to AF, s/p DCCV.Marland Kitchen c. 2018-2019 increase in burden.  . SSS (sick sinus syndrome) ([place])    a. s/p Medtronic PPM by JA for SSS and syncope 9/11.    Past Surgical History:  Procedure Laterality Date  . CARDIAC CATHETERIZATION  07/05/2009   EF 55-60%  . CARDIOVERSION N/A 06/11/2015   Procedure: CARDIOVERSION;  Surgeon: [name], MD;  Location: [facility] ENDOSCOPY;  Service: Cardiovascular;  Laterality: N/A;  . CHOLECYSTECTOMY N/A 09/14/2013   Procedure: LAPAROSCOPIC CHOLECYSTECTOMY WITH INTRAOPERATIVE CHOLANGIOGRAM;  Surgeon: [name], MD;  Location: [place];  Service: General;  Laterality: N/A;  . COLONOSCOPY    . ERCP N/A 09/13/2013   Procedure: ENDOSCOPIC RETROGRADE CHOLANGIOPANCREATOGRAPHY (ERCP);  Surgeon: [name], MD;  Location[facility] ENDOSCOPY;  Service: Endoscopy;  Laterality: N/A;  . INSERT / REPLACE / REMOVE PACEMAKER  9/11   SSS and  syncope, implanted by JA (MDT)  . MASTECTOMY  1992   BILATERAL WITH RECONSTRUCTION    Social History   Socioeconomic History  . Marital status: Widowed    Spouse name: Not on file  . Number of children: Not on file  . Years of education: Not on file  . Highest education level: Not on file  Occupational History  . Not on file  Social Needs  . Financial resource strain: Not on file  . Food insecurity:    Worry: Not on file    Inability: Not on file  . Transportation needs:    Medical: Not on file    Non-medical: Not on file  Tobacco Use  . Smoking status: Never Smoker  . Smokeless tobacco: Never Used  Substance and Sexual Activity  . Alcohol use: No    Alcohol/week: 0.0 oz  .  Drug use: No  . Sexual activity: Not on file  Lifestyle  . Physical activity:    Days per week: Not on file    Minutes per session: Not on file  . Stress: Not on file  Relationships  . Social connections:    Talks on phone: Not on file    Gets together: Not on file    Attends religious service: Not on file    Active member of club or organization: Not on file    Attends meetings of clubs or organizations: Not on file    Relationship status: Not on file  Other Topics Concern  . Not on file  Social History Narrative  . Not on file    Family History  Problem Relation Age of Onset  . Cancer Mother   . Cancer Father     Review of Systems  Constitutional: Negative for chills and fever.  Respiratory: Positive for shortness of breath (only with the heat/humidity). Negative for cough and wheezing.   Cardiovascular: Positive for leg swelling (mild). Negative for chest pain and palpitations.  Neurological: Negative for light-headedness and headaches.  Psychiatric/Behavioral: Negative for dysphoric mood and sleep disturbance. The patient is not nervous/anxious.        Objective:   Vitals:   04/14/18 1513  BP: 136/86  Pulse: 90  Resp: 16  Temp: 97.6 F (36.4 C)  SpO2: 94%   BP  Readings from Last 3 Encounters:  04/14/18 136/86  03/30/18 138/68  01/08/18 140/84   Wt Readings from Last 3 Encounters:  04/14/18 147 lb (66.7 kg)  03/30/18 149 lb (67.6 kg)  01/08/18 148 lb (67.1 kg)   Body mass index is 26.04 kg/m.   Physical Exam    Constitutional: Elderly, frail female. No distress.  HENT:  Head: Normocephalic and atraumatic.  Neck: Neck supple. No tracheal deviation present. No thyromegaly present.  No cervical lymphadenopathy Cardiovascular: Normal rate, regular rhythm and normal heart sounds.   2/6 systolic murmur heard. No carotid bruit .  Mild pitting bilateral ankle edema Pulmonary/Chest: Effort normal and breath sounds normal. No respiratory distress. No has no wheezes. No rales.  Skin: Skin is warm and dry. Not diaphoretic.  Psychiatric: Normal mood and affect. Behavior is normal.      Assessment & Plan:    Deferred DEXA-she will think about it  See Problem List for Assessment and Plan of chronic medical problems.

## 2018-04-14 ENCOUNTER — Encounter: Payer: Self-pay | Admitting: Internal Medicine

## 2018-04-14 ENCOUNTER — Ambulatory Visit (INDEPENDENT_AMBULATORY_CARE_PROVIDER_SITE_OTHER): Payer: Medicare Other | Admitting: Internal Medicine

## 2018-04-14 ENCOUNTER — Other Ambulatory Visit (INDEPENDENT_AMBULATORY_CARE_PROVIDER_SITE_OTHER): Payer: Medicare Other

## 2018-04-14 VITALS — BP 136/86 | HR 90 | Temp 97.6°F | Resp 16 | Wt 147.0 lb

## 2018-04-14 DIAGNOSIS — E1140 Type 2 diabetes mellitus with diabetic neuropathy, unspecified: Secondary | ICD-10-CM

## 2018-04-14 DIAGNOSIS — E7800 Pure hypercholesterolemia, unspecified: Secondary | ICD-10-CM

## 2018-04-14 DIAGNOSIS — I10 Essential (primary) hypertension: Secondary | ICD-10-CM | POA: Diagnosis not present

## 2018-04-14 DIAGNOSIS — E0842 Diabetes mellitus due to underlying condition with diabetic polyneuropathy: Secondary | ICD-10-CM | POA: Diagnosis not present

## 2018-04-14 DIAGNOSIS — N183 Chronic kidney disease, stage 3 unspecified: Secondary | ICD-10-CM

## 2018-04-14 DIAGNOSIS — Z23 Encounter for immunization: Secondary | ICD-10-CM | POA: Diagnosis not present

## 2018-04-14 DIAGNOSIS — I482 Chronic atrial fibrillation: Secondary | ICD-10-CM

## 2018-04-14 DIAGNOSIS — R600 Localized edema: Secondary | ICD-10-CM | POA: Diagnosis not present

## 2018-04-14 DIAGNOSIS — I5042 Chronic combined systolic (congestive) and diastolic (congestive) heart failure: Secondary | ICD-10-CM | POA: Diagnosis not present

## 2018-04-14 DIAGNOSIS — I4821 Permanent atrial fibrillation: Secondary | ICD-10-CM

## 2018-04-14 LAB — LIPID PANEL
CHOL/HDL RATIO: 3
Cholesterol: 119 mg/dL (ref 0–200)
HDL: 41.5 mg/dL (ref >39.00)
LDL CALC: 63 mg/dL (ref 0–99)
NonHDL: 77.86
TRIGLYCERIDES: 73 mg/dL (ref 0.0–149.0)
VLDL: 14.6 mg/dL (ref 0.0–40.0)

## 2018-04-14 LAB — HEMOGLOBIN A1C: Hgb A1c MFr Bld: 6.6 % — ABNORMAL HIGH (ref 4.6–6.5)

## 2018-04-14 LAB — CBC WITH DIFFERENTIAL/PLATELET
BASOS PCT: 0.8 % (ref 0.0–3.0)
Basophils Absolute: 0.1 10*3/uL (ref 0.0–0.1)
EOS ABS: 0.1 10*3/uL (ref 0.0–0.7)
Eosinophils Relative: 1.1 % (ref 0.0–5.0)
HEMATOCRIT: 39.7 % (ref 36.0–46.0)
Hemoglobin: 13.7 g/dL (ref 12.0–15.0)
LYMPHS ABS: 2 10*3/uL (ref 0.7–4.0)
LYMPHS PCT: 30.8 % (ref 12.0–46.0)
MCHC: 34.4 g/dL (ref 30.0–36.0)
MCV: 89 fl (ref 78.0–100.0)
MONO ABS: 0.5 10*3/uL (ref 0.1–1.0)
Monocytes Relative: 7.2 % (ref 3.0–12.0)
NEUTROS PCT: 60.1 % (ref 43.0–77.0)
Neutro Abs: 3.8 10*3/uL (ref 1.4–7.7)
PLATELETS: 196 10*3/uL (ref 150.0–400.0)
RBC: 4.46 Mil/uL (ref 3.87–5.11)
RDW: 15 % (ref 11.5–15.5)
WBC: 6.4 10*3/uL (ref 4.0–10.5)

## 2018-04-14 LAB — COMPREHENSIVE METABOLIC PANEL
ALT: 11 U/L (ref 0–35)
AST: 15 U/L (ref 0–37)
Albumin: 4.2 g/dL (ref 3.5–5.2)
Alkaline Phosphatase: 97 U/L (ref 39–117)
BUN: 25 mg/dL — AB (ref 6–23)
CHLORIDE: 107 meq/L (ref 96–112)
CO2: 28 meq/L (ref 19–32)
Calcium: 10.5 mg/dL (ref 8.4–10.5)
Creatinine, Ser: 1.12 mg/dL (ref 0.40–1.20)
GFR: 48.68 mL/min — ABNORMAL LOW (ref >60.00)
GLUCOSE: 100 mg/dL — AB (ref 70–99)
Potassium: 4.3 mEq/L (ref 3.5–5.1)
SODIUM: 142 meq/L (ref 135–145)
Total Bilirubin: 0.8 mg/dL (ref 0.2–1.2)
Total Protein: 7.3 g/dL (ref 6.0–8.3)

## 2018-04-14 LAB — TSH: TSH: 3.73 u[IU]/mL (ref 0.35–4.50)

## 2018-04-14 NOTE — Assessment & Plan Note (Signed)
Heart rate fairly controlled Asymptomatic Taking Eliquis, carvedilol Will check CBC, CMP

## 2018-04-14 NOTE — Assessment & Plan Note (Signed)
BP well controlled Current regimen effective and well tolerated Continue current medications at current doses cmp  

## 2018-04-14 NOTE — Assessment & Plan Note (Signed)
Taking Crestor-continue Check CMP, lipid panel Continue healthy diet and regular exercise

## 2018-04-14 NOTE — Assessment & Plan Note (Signed)
CMP, CBC

## 2018-04-14 NOTE — Assessment & Plan Note (Addendum)
Taking gabapentin 300 mg in the morning, 300 in the afternoon and 400 mg at night Still having pain, no numbness or tingling Discussed possibly starting Cymbalta-she will consider this, but wants to think about it Following with podiatry

## 2018-04-14 NOTE — Assessment & Plan Note (Signed)
Sugars have been well controlled Currently diet controlled Check A1c

## 2018-04-14 NOTE — Assessment & Plan Note (Signed)
Appears to be euvolemic Mild ankle edema, which is stable Has seen cardiology recently and has had blood work done Since she is getting blood work done here today will recheck CMP Has been started on lisinopril

## 2018-04-14 NOTE — Assessment & Plan Note (Signed)
Mild, controlled Continue current dose of Lasix

## 2018-04-15 ENCOUNTER — Other Ambulatory Visit: Payer: Self-pay | Admitting: Internal Medicine

## 2018-04-15 MED ORDER — GABAPENTIN 300 MG PO CAPS
300.0000 mg | ORAL_CAPSULE | Freq: Three times a day (TID) | ORAL | 0 refills | Status: DC
Start: 2018-04-15 — End: 2018-08-09

## 2018-04-15 MED ORDER — GABAPENTIN 100 MG PO CAPS: ORAL_CAPSULE | ORAL | 0 refills | Status: DC

## 2018-04-15 NOTE — Addendum Note (Signed)
Addended by: Cresenciano Lick on: 04/15/2018 02:04 PM   Modules accepted: Orders

## 2018-05-04 ENCOUNTER — Other Ambulatory Visit: Payer: Self-pay | Admitting: Internal Medicine

## 2018-05-25 ENCOUNTER — Ambulatory Visit: Payer: Medicare Other | Admitting: Podiatry

## 2018-05-25 DIAGNOSIS — B351 Tinea unguium: Secondary | ICD-10-CM | POA: Diagnosis not present

## 2018-05-25 DIAGNOSIS — E1140 Type 2 diabetes mellitus with diabetic neuropathy, unspecified: Secondary | ICD-10-CM

## 2018-05-25 DIAGNOSIS — M79676 Pain in unspecified toe(s): Secondary | ICD-10-CM

## 2018-05-26 NOTE — Progress Notes (Signed)
Subjective: 84 y.o. returns the office today for painful, elongated, thickened toenails which she cannot trim herself. Denies any redness or drainage around the nails.  She increased the dose of gabapentin and has been helpful.  Denies any acute changes since last appointment and no new complaints today. Denies any systemic complaints such as fevers, chills, nausea, vomiting.   Objective: AAO 3, NAD DP/PT pulses palpable, CRT less than 3 seconds  Sensation decreased with Simms Weinstein monofilament. Nails hypertrophic, dystrophic, elongated, brittle, discolored 10. There is tenderness overlying the nails 1-5 bilaterally. There is no surrounding erythema or drainage along the nail sites.  No open lesions or pre-ulcerative lesions are identified. No pain with calf compression, swelling, warmth, erythema.  Assessment: Patient presents with symptomatic onychomycosis; neuropathy  Plan: -Treatment options including alternatives, risks, complications were discussed -Nails sharply debrided 10 without complication/bleeding. -Continue dose of gabapentin as this increase has been helpful.  -Discussed daily foot inspection. If there are any changes, to call the office immediately.  -Follow-up in 3 months or sooner if any problems are to arise. In the meantime, encouraged to call the office with any questions, concerns, changes symptoms.  Celesta Gentile, DPM

## 2018-05-27 ENCOUNTER — Ambulatory Visit: Payer: Medicare Other | Admitting: Podiatry

## 2018-06-02 ENCOUNTER — Telehealth: Payer: Self-pay

## 2018-06-02 ENCOUNTER — Ambulatory Visit (INDEPENDENT_AMBULATORY_CARE_PROVIDER_SITE_OTHER): Payer: Medicare Other | Admitting: *Deleted

## 2018-06-02 DIAGNOSIS — I495 Sick sinus syndrome: Secondary | ICD-10-CM | POA: Diagnosis not present

## 2018-06-02 NOTE — Telephone Encounter (Signed)
Confirmed remote transmission w/ pt daughter.   

## 2018-06-03 ENCOUNTER — Encounter: Payer: Self-pay | Admitting: Cardiology

## 2018-06-03 NOTE — Progress Notes (Signed)
Remote pacemaker transmission.   

## 2018-06-07 ENCOUNTER — Other Ambulatory Visit: Payer: Self-pay | Admitting: Internal Medicine

## 2018-06-07 ENCOUNTER — Other Ambulatory Visit: Payer: Self-pay

## 2018-06-07 MED ORDER — FUROSEMIDE 20 MG PO TABS: ORAL_TABLET | ORAL | 2 refills | Status: DC

## 2018-06-17 LAB — CUP PACEART REMOTE DEVICE CHECK
Brady Statistic AP VP Percent: 3 %
Brady Statistic AS VP Percent: 4 %
Brady Statistic AS VS Percent: 93 %
Date Time Interrogation Session: 20190731193613
Implantable Lead Implant Date: 20110912
Implantable Lead Implant Date: 20110912
Implantable Lead Location: 753859
Implantable Lead Location: 753860
Implantable Lead Model: 5076
Lead Channel Pacing Threshold Amplitude: 0.625 V
Lead Channel Pacing Threshold Amplitude: 0.75 V
Lead Channel Sensing Intrinsic Amplitude: 5.6 mV
Lead Channel Setting Pacing Pulse Width: 0.4 ms
Lead Channel Setting Sensing Sensitivity: 2 mV
MDC IDC MSMT BATTERY IMPEDANCE: 857 Ohm
MDC IDC MSMT BATTERY REMAINING LONGEVITY: 65 mo
MDC IDC MSMT BATTERY VOLTAGE: 2.79 V
MDC IDC MSMT LEADCHNL RA IMPEDANCE VALUE: 502 Ohm
MDC IDC MSMT LEADCHNL RA PACING THRESHOLD PULSEWIDTH: 0.4 ms
MDC IDC MSMT LEADCHNL RA SENSING INTR AMPL: 0.7 mV
MDC IDC MSMT LEADCHNL RV IMPEDANCE VALUE: 657 Ohm
MDC IDC MSMT LEADCHNL RV PACING THRESHOLD PULSEWIDTH: 0.4 ms
MDC IDC PG IMPLANT DT: 20110912
MDC IDC SET LEADCHNL RA PACING AMPLITUDE: 2 V
MDC IDC SET LEADCHNL RV PACING AMPLITUDE: 2.5 V
MDC IDC STAT BRADY AP VS PERCENT: 0 %

## 2018-06-29 ENCOUNTER — Telehealth: Payer: Self-pay

## 2018-06-29 NOTE — Telephone Encounter (Signed)
LVM for pt to call back. Rx has been faxed.

## 2018-06-29 NOTE — Telephone Encounter (Signed)
Copied from [place] [id number]. Topic: General - Other >> Jun 04, 2018  3:33 PM Carolyn Stare wrote:  Pt daughter call to say pt need a rx to get new bras   [phone]  >> Jun 28, 2018  3:40 PM Sheran Luz wrote: Pts daughter [name] called back on behalf of pt stating that "[employer]"  has not received the letter of necessity for the bras. She is requesting it be re faxed to [phone]

## 2018-07-02 ENCOUNTER — Ambulatory Visit: Payer: Medicare Other | Admitting: Cardiology

## 2018-07-02 ENCOUNTER — Encounter: Payer: Self-pay | Admitting: Cardiology

## 2018-07-02 VITALS — BP 110/80 | HR 82 | Ht 63.0 in | Wt 150.2 lb

## 2018-07-02 DIAGNOSIS — I480 Paroxysmal atrial fibrillation: Secondary | ICD-10-CM | POA: Diagnosis not present

## 2018-07-02 DIAGNOSIS — I2510 Atherosclerotic heart disease of native coronary artery without angina pectoris: Secondary | ICD-10-CM | POA: Diagnosis not present

## 2018-07-02 DIAGNOSIS — I5042 Chronic combined systolic (congestive) and diastolic (congestive) heart failure: Secondary | ICD-10-CM

## 2018-07-02 DIAGNOSIS — Z789 Other specified health status: Secondary | ICD-10-CM | POA: Diagnosis not present

## 2018-07-02 NOTE — Patient Instructions (Addendum)
Medication Instructions:   Your physician recommends that you continue on your current medications as directed. Please refer to the Current Medication list given to you today.   Labwork:  PRIOR TO YOUR 6 MONTH FOLLOW-UP APPOINTMENT WITH DR NELSON--WILL CHECK--CMET, CBC W DIFF, AND TSH--YOUR LAB APPOINTMENT IS SCHEDULED FOR Monday December 27, 2017     Follow-Up:  Your physician wants you to follow-up in: [place] will receive a reminder letter in the mail two months in advance. If you don't receive a letter, please call our office to schedule the follow-up appointment.  PLEASE HAVE YOUR LABS DONE PRIOR TO THIS OFFICE VISIT        If you need a refill on your cardiac medications before your next appointment, please call your pharmacy.

## 2018-07-02 NOTE — Progress Notes (Signed)
Cardiology Office Note    Date:  07/02/2018  ID:  Jessica [name], DOB Apr 01, 1927, MRN [id number] PCP:  Binnie Rail, MD  Cardiologist:  Ena Dawley, MD   Chief Complaint: f/u CHF  History of Present Illness:  Jessica [name] is a 84 y.o. female with history of CAD (DESx2 to mid and distal RCA in 2010), chronic combined CHF, hypertensive heart disease, DM with peripheral neuropathy, HTN, HLD, SSS s/p Medtronic pacemaker 2011, permanent atrial fib (discovered on device interrogation 10/2014), CKD stage III, moderate pulm HTN (by last echo, difficulty complying with fluid restriction who presents f/u of CHF.  She was hospitalized for acute diastolic CHF in 06/6577 in the setting of accelerated HTN and PAF. She underwent DCCV at that time. Echo 06/2015: EF 55-60%, no RWMA, mild AI, mild-mod MR, mild LAE/RAE, mildly reduced RV function, mod TR, PASP 84mHg. She was managed at the end of 2017 with worsening CHF in the setting of repeated noncompliance with fluid restriction (up to 4L of water per day). Pacer interrogation at that time showed very low AF burden of 0.6%.  In 2018, her device interrogation 12/03/16 showed increased spike in atrial fib burden to 54.8% and she required additional diuresis for CHF. Her subsequent device interrogations show 100% AT/AF burden. This has been followed by Dr. ARayann Hemanand it appears a rate control strategy was undertaken.There has been prior issue trying to find a balance between euvolemia and renal function/blood pressure.   Admitted in 10/2017 for acute on chronic combined CHF with new cardiomyopathy with EF 30-35%, moderate diffuse HK, mild AI, mild MR, mildly increased PASP. The patient and daughter wished for conservative management instead of cath.   03/2018- she returns for follow-up with daughter Jessica [name] In general they feel she is doing well. Her major complaint is neuropathic pain in her feet for which her gabapentin was increased recently with some  improvement. Her legs swell from time to time but in general they feel she's doing well. She denies any SOB. She has been drinking 4-5 16oz bottles of water per day.  07/02/2018 -she comes with her daughter, she looks very happy and states that she has been feeling well. No lower extremity edema, no orthopnea or paroxysmal nocturnal dyspnea.  She is walking with a walker and has good appetite.  Her weight is unchanged.  She denies any chest pain.   Past Medical History:  Diagnosis Date  . Breast cancer ([facility]   . CAD (coronary artery disease)    a. s/p DESx2 to mid and distal RCA in 2010.  .Marland KitchenCholelithiasis 09/12/2013   Lap Chole on 09/14/13   . Chronic combined systolic and diastolic CHF (congestive heart failure) ([facility]    a. Echo 06/09/15: EF 55-60% -> 10/2017 EF 30-35% (pt and daughter did not wish to proceed with ischemic assessment - conservative rx).  . CKD (chronic kidney disease), stage III ([facility]   . Diabetes mellitus    NON INSULIN DEPENDENT  . DJD (degenerative joint disease)   . GERD (gastroesophageal reflux disease)   . Hemorrhoids   . History of diabetic neuropathy   . Hypercholesterolemia   . Hypertension   . Hypertensive heart disease   . Persistent atrial fibrillation ([facility]    a. discovered on PPM interrogation 12/15, CHAD2VASC score is 6. b. decompensation with dCHF in 06/2015 possibly due to AF, s/p DCCV..Marland Kitchenc. 2018-2019 increase in burden.  . SSS (sick sinus syndrome) ([facility])    a. s/p Medtronic  PPM by JA for SSS and syncope 9/11.    Past Surgical History:  Procedure Laterality Date  . CARDIAC CATHETERIZATION  07/05/2009   EF 55-60%  . CARDIOVERSION N/A 06/11/2015   Procedure: CARDIOVERSION;  Surgeon: [name], MD;  Location: [facility] END[facility]COPY;  Service: Cardiovascular;  Laterality: N/A;  . CHOLECYSTECTOMY N/A 09/14/2013   Procedure: LAPAROSCOPIC CHOLECYSTECTOMY WITH INTRAOPERATIVE CHOLANGIOGRAM;  Surgeon: [name], MD;  Location: [facility];  Service: General;   Laterality: N/A;  . COLONOSCOPY    . ERCP N/A 09/13/2013   Procedure: ENDOSCOPIC RETROGRADE CHOLANGIOPANCREATOGRAPHY (ERCP);  Surgeon: [name], MD;  Location: [facility] ENDOSCOPY;  Service: Endoscopy;  Laterality: N/A;  . INSERT / REPLACE / REMOVE PACEMAKER  9/11   SSS and syncope, implanted by JA (MDT)  . MASTECTOMY  1992   BILATERAL WITH RECONSTRUCTION    Current Medications: Current Meds  Medication Sig  . b complex vitamins tablet Take 1 tablet by mouth daily.  . blood glucose meter kit and supplies KIT Dispense based on patient and insurance preference. Test sugars once daily and prn as directed. (FOR ICD-9 250.00, 250.01).  . carvedilol (COREG) 12.5 MG tablet Take 1 tablet (12.5 mg total) by mouth 2 (two) times daily.  Marland Kitchen docusate sodium (COLACE) 100 MG capsule Take 100 mg by mouth at bedtime.  Marland Kitchen ECHINACEA PO Take 750 mg by mouth daily.  Marland Kitchen ELIQUIS 5 MG TABS tablet TAKE 1 TABLET BY MOUTH TWICE DAILY  . fish oil-omega-3 fatty acids 1000 MG capsule Take 1 g by mouth every morning.   . furosemide (LASIX) 20 MG tablet Take 2 tablets by mouth every morning and one tablet every afternoon  . gabapentin (NEURONTIN) 100 MG capsule 300 mg in the morning, 300 in the afternoon and 400 mg at night  . gabapentin (NEURONTIN) 100 MG capsule Take in addition to 300 mg capsule at night  . gabapentin (NEURONTIN) 300 MG capsule Take 1 capsule (300 mg total) by mouth 3 (three) times daily.  . Misc. Devices (ROLLATOR ULTRA-LIGHT) MISC With seat.  Diabetic neuropathy, knee arthritis  . nitroGLYCERIN (NITROSTAT) 0.4 MG SL tablet Place 1 tablet (0.4 mg total) under the tongue every 5 (five) minutes as needed for chest pain. x3 doses as needed for chest pain  . potassium chloride SA (K-DUR,KLOR-CON) 20 MEQ tablet TAKE 1 TABLET BY MOUTH ONCE DAILY    Allergies:   Lyrica [pregabalin]; Amlodipine; and Sulfonamide derivatives   Social History   Socioeconomic History  . Marital status: Widowed    Spouse  name: Not on file  . Number of children: Not on file  . Years of education: Not on file  . Highest education level: Not on file  Occupational History  . Not on file  Social Needs  . Financial resource strain: Not on file  . Food insecurity:    Worry: Not on file    Inability: Not on file  . Transportation needs:    Medical: Not on file    Non-medical: Not on file  Tobacco Use  . Smoking status: Never Smoker  . Smokeless tobacco: Never Used  Substance and Sexual Activity  . Alcohol use: No    Alcohol/week: 0.0 standard drinks  . Drug use: No  . Sexual activity: Not on file  Lifestyle  . Physical activity:    Days per week: Not on file    Minutes per session: Not on file  . Stress: Not on file  Relationships  . Social  connections:    Talks on phone: Not on file    Gets together: Not on file    Attends religious service: Not on file    Active member of club or organization: Not on file    Attends meetings of clubs or organizations: Not on file    Relationship status: Not on file  Other Topics Concern  . Not on file  Social History Narrative  . Not on file     Family History:  The patient's family history includes Cancer in her father and mother.  ROS:   Please see the history of present illness. No chest pain All other systems are reviewed and otherwise negative.    PHYSICAL EXAM:   VS:  BP 110/80   Pulse 82   Ht 5' 3"  (1.6 m)   Wt 150 lb 3.2 oz (68.1 kg)   LMP  (LMP Unknown)   SpO2 97%   BMI 26.61 kg/m   BMI: Body mass index is 26.61 kg/m. GEN: Well nourished, well developed elderly WF, in no acute distress HEENT: normocephalic, atraumatic Neck: no JVD, carotid bruits, or masses Cardiac: RRR; no murmurs, rubs, or gallops, no edema  Respiratory:  clear to auscultation bilaterally, normal work of breathing GI: rounded but soft, nontender, + BS MS: no deformity or atrophy Skin: warm and dry, no rash Neuro:  Alert and Oriented x 3, Strength and sensation  are intact, follows commands Psych: euthymic mood, full affect  Wt Readings from Last 3 Encounters:  07/02/18 150 lb 3.2 oz (68.1 kg)  04/14/18 147 lb (66.7 kg)  03/30/18 149 lb (67.6 kg)      Studies/Labs Reviewed:   EKG:  EKG was ordered today and personally reviewed by me and demonstrates V paced rhythm 70bpm, TWI avF  Recent Labs: 10/30/2017: B Natriuretic Peptide 3,496.2 11/02/2017: Magnesium 2.4 01/08/2018: NT-Pro BNP 10,443 04/14/2018: ALT 11; BUN 25; Creatinine, Ser 1.12; Hemoglobin 13.7; Platelets 196.0; Potassium 4.3; Sodium 142; TSH 3.73   Lipid Panel    Component Value Date/Time   CHOL 119 04/14/2018 1618   TRIG 73.0 04/14/2018 1618   HDL 41.50 04/14/2018 1618   CHOLHDL 3 04/14/2018 1618   VLDL 14.6 04/14/2018 1618   LDLCALC 63 04/14/2018 1618   LDLDIRECT 152.0 10/10/2013 1619    Additional studies/ records that were reviewed today include: Summarized above.    ASSESSMENT & PLAN:   1. Chronic combined CHF -she appears euvolemic, I will continue the same dose of Lasix 40 mg daily and 20 mEq of potassium.  We will repeat her labs prior to his next visit in 6 months. 2. Permanent atrial fib - rate controlled. Continue apixaban.  No bleeding most recent hemoglobin is 13. 3. CAD - no recent angina. Suspect not on ASA due to concomitant apixaban. 4. Hyperlipidemia -tolerating rosuvastatin and no significant memory impairment.  Disposition: F/u with Dr. [name] in 6 months.   Medication Adjustments/Labs and Tests Ordered: Current medicines are reviewed at length with the patient today.  Concerns regarding medicines are outlined above. Medication changes, Labs and Tests ordered today are summarized above and listed in the Patient Instructions accessible in Encounters.   Signed, Ena Dawley, MD  07/02/2018 3:04 PM    [facility] [place], [place], [place]  [address] Phone: [phone]; Fax: [phone]

## 2018-08-03 LAB — HM DIABETES EYE EXAM

## 2018-08-09 ENCOUNTER — Other Ambulatory Visit: Payer: Self-pay | Admitting: Internal Medicine

## 2018-08-18 ENCOUNTER — Ambulatory Visit: Payer: Medicare Other

## 2018-08-26 ENCOUNTER — Encounter: Payer: Self-pay | Admitting: Podiatry

## 2018-08-26 ENCOUNTER — Ambulatory Visit: Payer: Medicare Other | Admitting: Podiatry

## 2018-08-26 DIAGNOSIS — E1140 Type 2 diabetes mellitus with diabetic neuropathy, unspecified: Secondary | ICD-10-CM

## 2018-08-26 DIAGNOSIS — M79676 Pain in unspecified toe(s): Secondary | ICD-10-CM

## 2018-08-26 DIAGNOSIS — B351 Tinea unguium: Secondary | ICD-10-CM

## 2018-08-29 NOTE — Progress Notes (Signed)
Subjective: 84 y.o. returns the office today for painful, elongated, thickened toenails which she cannot trim herself. Denies any redness or drainage around the nails.  She increased the dose of gabapentin and has been helpful.  Denies any acute changes since last appointment and no new complaints today. Denies any systemic complaints such as fevers, chills, nausea, vomiting.   Objective: AAO 3, NAD DP/PT pulses palpable, CRT less than 3 seconds  Sensation decreased with Simms Weinstein monofilament. Nails hypertrophic, dystrophic, elongated, brittle, discolored 10. There is tenderness overlying the nails 1-5 bilaterally. There is no surrounding erythema or drainage along the nail sites.  No open lesions or pre-ulcerative lesions are identified. No pain with calf compression, swelling, warmth, erythema. Overall no changes.  Assessment: Patient presents with symptomatic onychomycosis; neuropathy  Plan: -Treatment options including alternatives, risks, complications were discussed -Nails sharply debrided 10 without complication/bleeding. -Continue gabapentin for neuropathy  -Discussed daily foot inspection. If there are any changes, to call the office immediately.  -Follow-up in 3 months or sooner if any problems are to arise. In the meantime, encouraged to call the office with any questions, concerns, changes symptoms.  Celesta Gentile, DPM

## 2018-09-01 ENCOUNTER — Ambulatory Visit (INDEPENDENT_AMBULATORY_CARE_PROVIDER_SITE_OTHER): Payer: Medicare Other | Admitting: *Deleted

## 2018-09-01 DIAGNOSIS — I495 Sick sinus syndrome: Secondary | ICD-10-CM

## 2018-09-01 DIAGNOSIS — I110 Hypertensive heart disease with heart failure: Secondary | ICD-10-CM

## 2018-09-01 NOTE — Progress Notes (Signed)
Remote pacemaker transmission.   

## 2018-09-10 ENCOUNTER — Encounter: Payer: Self-pay | Admitting: Cardiology

## 2018-09-19 NOTE — Progress Notes (Signed)
Cardiology Office Note Date:  09/20/2018  Patient ID:  Kandise, Riehle July 11, 1927, MRN [id number] PCP:  Binnie Rail, MD  Cardiologist:  Dr. [name] Electrophysiologist: Dr. [name]    Chief Complaint: annual EP visit  History of Present Illness: DANILYNN JEMISON is a 84 y.o. female with history of CAD (DESx2 to mid and distal RCA in 2010), chronic CHF (combinde), HTN, DM, peripheral neuropathy, HLD, permanent AFib, CKD (III), p.HTN, tachy-brady w/PPM.  She comes in today to be seen for Dr. [name], last seen by him in Nov 2018.  At that time noting AFib to be longstanding persistant device was programmed VVIR, planned for annual in-clinic visits. More recently saw Dr. [name], Aug 2019, was doing well, had a couple hospitalizations through the year with CHF exacerbations.  No changes were made at that visit.  She comes accompanied by her daughter.  They both feel that of late she is doing very well.  She has had a few falls in the last year, though not frequently and were trip/falls, walking with walker now.  No CP, palpitations or SOB.  She denies any symptoms of PND or orthopnea, no fainting.  She denies any bleeding or signs of bleeding.  They report daily weights have been stable and do not feel like she has been having any trouble with fluid retention.  She sees her PMD in 2 weeks for labs and a visit.   Device information: MDT dual chamber PPM, implanted 07/15/10   Past Medical History:  Diagnosis Date  . Breast cancer ([place])   . CAD (coronary artery disease)    a. s/p DESx2 to mid and distal RCA in 2010.  Marland Kitchen Cholelithiasis 09/12/2013   Lap Chole on 09/14/13   . Chronic combined systolic and diastolic CHF (congestive heart failure) ([place])    a. Echo 06/09/15: EF 55-60% -> 10/2017 EF 30-35% (pt and daughter did not wish to proceed with ischemic assessment - conservative rx).  . CKD (chronic kidney disease), stage III ([place])   . Diabetes mellitus    NON INSULIN DEPENDENT  . DJD  (degenerative joint disease)   . GERD (gastroesophageal reflux disease)   . Hemorrhoids   . History of diabetic neuropathy   . Hypercholesterolemia   . Hypertension   . Hypertensive heart disease   . Persistent atrial fibrillation    a. discovered on PPM interrogation 12/15, CHAD2VASC score is 6. b. decompensation with dCHF in 06/2015 possibly due to AF, s/p DCCV.Marland Kitchen c. 2018-2019 increase in burden.  . SSS (sick sinus syndrome) ([place])    a. s/p Medtronic PPM by JA for SSS and syncope 9/11.    Past Surgical History:  Procedure Laterality Date  . CARDIAC CATHETERIZATION  07/05/2009   EF 55-60%  . CARDIOVERSION N/A 06/11/2015   Procedure: CARDIOVERSION;  Surgeon: [name], MD;  Location: [facility] ENDOSCOPY;  Service: Cardiovascular;  Laterality: N/A;  . CHOLECYSTECTOMY N/A 09/14/2013   Procedure: LAPAROSCOPIC CHOLECYSTECTOMY WITH INTRAOPERATIVE CHOLANGIOGRAM;  Surgeon: [name], MD;  Location: [facility];  Service: General;  Laterality: N/A;  . COLONOSCOPY    . ERCP N/A 09/13/2013   Procedure: ENDOSCOPIC RETROGRADE CHOLANGIOPANCREATOGRAPHY (ERCP);  Surgeon: [name], MD;  Location: [facility] ENDOSCOPY;  Service: Endoscopy;  Laterality: N/A;  . INSERT / REPLACE / REMOVE PACEMAKER  9/11   SSS and syncope, implanted by JA (MDT)  . MASTECTOMY  1992   BILATERAL WITH RECONSTRUCTION    Current Outpatient Medications  Medication Sig Dispense  Refill  . b complex vitamins tablet Take 1 tablet by mouth daily.    . blood glucose meter kit and supplies KIT Dispense based on patient and insurance preference. Test sugars once daily and prn as directed. (FOR ICD-9 250.00, 250.01). 1 each 0  . carvedilol (COREG) 12.5 MG tablet Take 1 tablet (12.5 mg total) by mouth 2 (two) times daily. 180 tablet 2  . docusate sodium (COLACE) 100 MG capsule Take 100 mg by mouth at bedtime.    Marland Kitchen ECHINACEA PO Take 750 mg by mouth daily.    Marland Kitchen ELIQUIS 5 MG TABS tablet TAKE 1 TABLET BY MOUTH TWICE DAILY 180 tablet 3  .  fish oil-omega-3 fatty acids 1000 MG capsule Take 1 g by mouth every morning.     . furosemide (LASIX) 20 MG tablet Take 2 tablets by mouth every morning and one tablet every afternoon 270 tablet 2  . gabapentin (NEURONTIN) 100 MG capsule 300 mg in the morning, 300 in the afternoon and 400 mg at night 90 capsule 0  . gabapentin (NEURONTIN) 300 MG capsule TAKE 1 CAPSULE BY MOUTH THREE TIMES DAILY (Patient taking differently: 2 (two) times daily. ) 270 capsule 0  . Misc. Devices (ROLLATOR ULTRA-LIGHT) MISC With seat.  Diabetic neuropathy, knee arthritis 1 each 0  . nitroGLYCERIN (NITROSTAT) 0.4 MG SL tablet Place 1 tablet (0.4 mg total) under the tongue every 5 (five) minutes as needed for chest pain. x3 doses as needed for chest pain 25 tablet 0  . potassium chloride SA (K-DUR,KLOR-CON) 20 MEQ tablet TAKE 1 TABLET BY MOUTH ONCE DAILY 90 tablet 1  . lisinopril (PRINIVIL,ZESTRIL) 5 MG tablet Take 1 tablet (5 mg total) by mouth daily. 90 tablet 3  . rosuvastatin (CRESTOR) 5 MG tablet Take 1 tablet (5 mg total) by mouth at bedtime. 90 tablet 3   No current facility-administered medications for this visit.     Allergies:   Lyrica [pregabalin]; Amlodipine; and Sulfonamide derivatives   Social History:  The patient  reports that she has never smoked. She has never used smokeless tobacco. She reports that she does not drink alcohol or use drugs.   Family History:  The patient's family history includes Cancer in her father and mother.  ROS:  Please see the history of present illness.  All other systems are reviewed and otherwise negative.   PHYSICAL EXAM:  VS:  BP 132/70   Pulse 70   Ht _0  (1.575 m)   Wt 155 lb (70.3 kg)   LMP  (LMP Unknown)   BMI 28.35 kg/m  BMI: Body mass index is 28.35 kg/m. Well nourished, well developed, in no acute distress  HEENT: normocephalic, atraumatic  Neck: no JVD, carotid bruits or masses Cardiac:  RRR (paced); no significant murmurs, no rubs, or  gallops Lungs:  CTA b/l, no wheezing, rhonchi or rales  Abd: soft, nontender MS: no deformity, age appropriate atrophy Ext: trace edema  Skin: warm and dry, no rash Neuro:  No gross deficits appreciated Psych: euthymic mood, full affect  PPM site is stable, no tethering or discomfort   EKG: not done today PPM interrogation doe today and reviewed by myself; battery and lead measurements are good, 100% AF, she arrives AS/VP, likely 70% VP by histogram, no programming changes made  11/01/17: TTE Study Conclusions - Procedure narrative: Transthoracic echocardiography. Image   quality was poor. The study was technically difficult, as a   result of poor acoustic windows, poor sound wave transmission,  chest wall deformity, and body habitus. - Left ventricle: The cavity size was normal. Wall thickness was   increased in a pattern of moderate LVH. Systolic function was   moderately to severely reduced. The estimated ejection fraction   was in the range of 30% to 35%. Moderate diffuse hypokinesis. - Aortic valve: There was mild regurgitation. - Mitral valve: Mildly calcified annulus. There was mild   regurgitation. - Pulmonary arteries: Systolic pressure was mildly increased. PA   peak pressure: 40 mm Hg (S).  Recent Labs: 10/30/2017: B Natriuretic Peptide 3,496.2 11/02/2017: Magnesium 2.4 01/08/2018: NT-Pro BNP 10,443 04/14/2018: ALT 11; BUN 25; Creatinine, Ser 1.12; Hemoglobin 13.7; Platelets 196.0; Potassium 4.3; Sodium 142; TSH 3.73  04/14/2018: Cholesterol 119; HDL 41.50; LDL Cholesterol 63; Total CHOL/HDL Ratio 3; Triglycerides 73.0; VLDL 14.6   CrCl cannot be calculated (Patient's most recent lab result is older than the maximum 21 days allowed.).   Wt Readings from Last 3 Encounters:  09/20/18 155 lb (70.3 kg)  07/02/18 150 lb 3.2 oz (68.1 kg)  04/14/18 147 lb (66.7 kg)     Other studies reviewed: Additional studies/records reviewed today include: summarized  above  ASSESSMENT AND PLAN:  1. PPM     Intact function  2. Chronic CHF (systolic), CM     Weight is up from last though she reports stable numbers at home, no symptpms of fluid OL, rtrace edema only     On BB/ACE, lasix     Labs in a couple week planned with PMD      3. Longstanding persistent Afib   CHA2DS2Vasc is 6, on Eliquis, appropriately dosed for weight and Creat  4. CAD     No anginal sounding symptoms     On BB, statin, no ASA w/eliquis     Continue with Dr. [name]   Disposition: F/u with every 3 month remotes, in-clinic pacer check in a year again in 1 year, sooner if needed.  Current medicines are reviewed at length with the patient today.  The patient did not have any concerns regarding medicines.  Venetia Night, PA-C 09/20/2018 3:05 PM     [place] [address] [phone] (office)  [phone] (fax)

## 2018-09-20 ENCOUNTER — Ambulatory Visit (INDEPENDENT_AMBULATORY_CARE_PROVIDER_SITE_OTHER): Payer: Medicare Other | Admitting: Physician Assistant

## 2018-09-20 VITALS — BP 132/70 | HR 70 | Ht 62.0 in | Wt 155.0 lb

## 2018-09-20 DIAGNOSIS — I480 Paroxysmal atrial fibrillation: Secondary | ICD-10-CM

## 2018-09-20 DIAGNOSIS — I2510 Atherosclerotic heart disease of native coronary artery without angina pectoris: Secondary | ICD-10-CM

## 2018-09-20 DIAGNOSIS — Z950 Presence of cardiac pacemaker: Secondary | ICD-10-CM | POA: Diagnosis not present

## 2018-09-20 DIAGNOSIS — I5043 Acute on chronic combined systolic (congestive) and diastolic (congestive) heart failure: Secondary | ICD-10-CM | POA: Diagnosis not present

## 2018-09-20 DIAGNOSIS — I5022 Chronic systolic (congestive) heart failure: Secondary | ICD-10-CM

## 2018-09-20 DIAGNOSIS — I4811 Longstanding persistent atrial fibrillation: Secondary | ICD-10-CM

## 2018-09-20 NOTE — Patient Instructions (Addendum)
Medication Instructions:   NONE ORDERED  TODAY  If you need a refill on your cardiac medications before your next appointment, please call your pharmacy.   Lab work:  NONE ORDERED  TODAY  If you have labs (blood work) drawn today and your tests are completely normal, you will receive your results only by: Marland Kitchen MyChart Message (if you have MyChart) OR . A paper copy in the mail If you have any lab test that is abnormal or we need to change your treatment, we will call you to review the results.  Testing/Procedures: . NONE ORDERED  TODAY   Follow-Up: At [facility], you and your health needs are our priority.  As part of our continuing mission to provide you with exceptional heart care, we have created designated Provider Care Teams.  These Care Teams include your primary Cardiologist (physician) and Advanced Practice Providers (APPs -  Physician Assistants and Nurse Practitioners) who all work together to provide you with the care you need, when you need it. You will need a follow up appointment in 12 months.  Please call our office 2 months in advance to schedule this appointment.  You may see Dr. [name] or one of the following Advanced Practice Providers on your designated Care Team:   Chanetta Marshall, NP . Tommye Standard, PA-C   Remote monitoring is used to monitor your Pacemaker of ICD from home. This monitoring reduces the number of office visits required to check your device to one time per year. It allows [place] to keep an eye on the functioning of your device to ensure it is working properly. You are scheduled for a device check from home on . 12-01-18 You may send your transmission at any time that day. If you have a wireless device, the transmission will be sent automatically. After your physician reviews your transmission, you will receive a postcard with your next transmission date.   Any Other Special Instructions Will Be Listed Below (If Applicable).

## 2018-10-12 NOTE — Progress Notes (Signed)
Subjective:    Patient ID: Jessica [name], female    DOB: [birth year], 84 y.o.   MRN: [id number]  HPI The patient is here for follow up.  She is here with her daughter.  Diabetes with neuropathy: She is taking her medication daily as prescribed.  She is taking gabapentin for the neuropathy.  Her neuropathy pain is not ideally controlled and that is the only thing she wishes would get better.  She is compliant with a diabetic diet. She is exercising minimally- walking.   CHF, Afib, Hypertension: She is taking her medication daily. She is compliant with a low sodium diet.  Her leg edema is controlled.  She denies chest pain, palpitations, shortness of breath and regular headaches. She is exercising minimally.      Hyperlipidemia: She is taking her medication daily. She is compliant with a low fat/cholesterol diet. She is exercising minimally-walking. She denies myalgias.   CKD, leg edema:   She is taking her medication daily as prescribed.  She does not take any NSAIDs.  Her leg edema has been well controlled with her current diuretics.  History of breast cancer, status post bilateral mastectomy: She gets mastectomy bras and needs new ones and needs a prescription.  Medications and allergies reviewed with patient and updated if appropriate.  Patient Active Problem List   Diagnosis Date Noted  . H/O bilateral mastectomy 10/13/2018  . HX: breast cancer 10/13/2018  . Permanent atrial fibrillation 10/31/2017  . Cardiac pacemaker in situ   . Chronic combined systolic and diastolic CHF (congestive heart failure) ([place]) 10/12/2017  . Bilateral leg edema 04/29/2016  . CKD (chronic kidney disease) stage 3, GFR 30-59 ml/min (HCC) 06/10/2015  . CAD (coronary artery disease) 06/09/2015  . Diabetic neuropathy ([place]) 07/06/2013  . SSS (sick sinus syndrome) ([place]) 02/10/2013  . Hypertension 07/30/2012  . Pacemaker-Medtronic 07/19/2012  . Benign hypertensive heart disease with heart failure ([place])  01/28/2012  . Diabetes ([place]) 10/22/2010  . HYPERCHOLESTEROLEMIA 10/22/2010  . DEGENERATIVE JOINT DISEASE 10/22/2010    Current Outpatient Medications on File Prior to Visit  Medication Sig Dispense Refill  . b complex vitamins tablet Take 1 tablet by mouth daily.    . blood glucose meter kit and supplies KIT Dispense based on patient and insurance preference. Test sugars once daily and prn as directed. (FOR ICD-9 250.00, 250.01). 1 each 0  . carvedilol (COREG) 12.5 MG tablet Take 1 tablet (12.5 mg total) by mouth 2 (two) times daily. 180 tablet 2  . docusate sodium (COLACE) 100 MG capsule Take 100 mg by mouth at bedtime.    Marland Kitchen ECHINACEA PO Take 750 mg by mouth daily.    Marland Kitchen ELIQUIS 5 MG TABS tablet TAKE 1 TABLET BY MOUTH TWICE DAILY 180 tablet 3  . fish oil-omega-3 fatty acids 1000 MG capsule Take 1 g by mouth every morning.     . furosemide (LASIX) 20 MG tablet Take 2 tablets by mouth every morning and one tablet every afternoon 270 tablet 2  . gabapentin (NEURONTIN) 100 MG capsule 300 mg in the morning, 300 in the afternoon and 400 mg at night 90 capsule 0  . gabapentin (NEURONTIN) 300 MG capsule TAKE 1 CAPSULE BY MOUTH THREE TIMES DAILY (Patient taking differently: 2 (two) times daily. ) 270 capsule 0  . lisinopril (PRINIVIL,ZESTRIL) 5 MG tablet Take 1 tablet (5 mg total) by mouth daily. 90 tablet 3  . Misc. Devices (ROLLATOR ULTRA-LIGHT) MISC With seat.  Diabetic neuropathy, knee  arthritis 1 each 0  . nitroGLYCERIN (NITROSTAT) 0.4 MG SL tablet Place 1 tablet (0.4 mg total) under the tongue every 5 (five) minutes as needed for chest pain. x3 doses as needed for chest pain 25 tablet 0  . potassium chloride SA (K-DUR,KLOR-CON) 20 MEQ tablet TAKE 1 TABLET BY MOUTH ONCE DAILY 90 tablet 1  . rosuvastatin (CRESTOR) 5 MG tablet Take 1 tablet (5 mg total) by mouth at bedtime. 90 tablet 3   No current facility-administered medications on file prior to visit.     Past Medical History:  Diagnosis  Date  . Breast cancer ([place])   . CAD (coronary artery disease)    a. s/p DESx2 to mid and distal RCA in 2010.  Marland Kitchen Cholelithiasis 09/12/2013   Lap Chole on 09/14/13   . Chronic combined systolic and diastolic CHF (congestive heart failure) ([place])    a. Echo 06/09/15: EF 55-60% -> 10/2017 EF 30-35% (pt and daughter did not wish to proceed with ischemic assessment - conservative rx).  . CKD (chronic kidney disease), stage III ([place])   . Diabetes mellitus    NON INSULIN DEPENDENT  . DJD (degenerative joint disease)   . GERD (gastroesophageal reflux disease)   . Hemorrhoids   . History of diabetic neuropathy   . Hypercholesterolemia   . Hypertension   . Hypertensive heart disease   . Persistent atrial fibrillation    a. discovered on PPM interrogation 12/15, CHAD2VASC score is 6. b. decompensation with dCHF in 06/2015 possibly due to AF, s/p DCCV.Marland Kitchen c. 2018-2019 increase in burden.  . SSS (sick sinus syndrome) ([place])    a. s/p Medtronic PPM by JA for SSS and syncope 9/11.    Past Surgical History:  Procedure Laterality Date  . CARDIAC CATHETERIZATION  07/05/2009   EF 55-60%  . CARDIOVERSION N/A 06/11/2015   Procedure: CARDIOVERSION;  Surgeon: [name], MD;  Location[facility] ENDOSCOPY;  Service: Cardiovascular;  Laterality: N/A;  . CHOLECYSTECTOMY N/A 09/14/2013   Procedure: LAPAROSCOPIC CHOLECYSTECTOMY WITH INTRAOPERATIVE CHOLANGIOGRAM;  Surgeon: [name], MD;  Location: [facility];  Service: General;  Laterality: N/A;  . COLONOSCOPY    . ERCP N/A 09/13/2013   Procedure: ENDOSCOPIC RETROGRADE CHOLANGIOPANCREATOGRAPHY (ERCP);  Surgeon: [name], MD;  Location: [facility] ENDOSCOPY;  Service: Endoscopy;  Laterality: N/A;  . INSERT / REPLACE / REMOVE PACEMAKER  9/11   SSS and syncope, implanted by JA (MDT)  . MASTECTOMY  1992   BILATERAL WITH RECONSTRUCTION    Social History   Socioeconomic History  . Marital status: Widowed    Spouse name: Not on file  . Number of children: Not on  file  . Years of education: Not on file  . Highest education level: Not on file  Occupational History  . Not on file  Social Needs  . Financial resource strain: Not on file  . Food insecurity:    Worry: Not on file    Inability: Not on file  . Transportation needs:    Medical: Not on file    Non-medical: Not on file  Tobacco Use  . Smoking status: Never Smoker  . Smokeless tobacco: Never Used  Substance and Sexual Activity  . Alcohol use: No    Alcohol/week: 0.0 standard drinks  . Drug use: No  . Sexual activity: Not on file  Lifestyle  . Physical activity:    Days per week: Not on file    Minutes per session: Not on file  . Stress: Not  on file  Relationships  . Social connections:    Talks on phone: Not on file    Gets together: Not on file    Attends religious service: Not on file    Active member of club or organization: Not on file    Attends meetings of clubs or organizations: Not on file    Relationship status: Not on file  Other Topics Concern  . Not on file  Social History Narrative  . Not on file    Family History  Problem Relation Age of Onset  . Cancer Mother   . Cancer Father     Review of Systems  Constitutional: Negative for chills and fever.  HENT: Positive for rhinorrhea.   Respiratory: Negative for cough, shortness of breath and wheezing.   Cardiovascular: Negative for chest pain, palpitations and leg swelling.  Neurological: Negative for light-headedness and headaches.       Objective:   Vitals:   10/13/18 1511  BP: (!) 156/92  Pulse: 69  Resp: 14  Temp: 97.8 F (36.6 C)  SpO2: 98%   BP Readings from Last 3 Encounters:  10/13/18 (!) 156/92  09/20/18 132/70  07/02/18 110/80   Wt Readings from Last 3 Encounters:  10/13/18 155 lb 12.8 oz (70.7 kg)  09/20/18 155 lb (70.3 kg)  07/02/18 150 lb 3.2 oz (68.1 kg)   Body mass index is 28.5 kg/m.   Physical Exam    Constitutional: Appears well-developed and well-nourished. No  distress.  HENT:  Head: Normocephalic and atraumatic.  Neck: Neck supple. No tracheal deviation present. No thyromegaly present.  No cervical lymphadenopathy Cardiovascular: Normal rate, irregular rhythm and normal heart sounds.   No murmur heard. No carotid bruit .  No edema Pulmonary/Chest: Effort normal and breath sounds normal. No respiratory distress. No has no wheezes. No rales.  Skin: Skin is warm and dry. Not diaphoretic.  Psychiatric: Normal mood and affect. Behavior is normal.      Assessment & Plan:    Discussed with her and her daughter about getting a bone density scan and possibly considering treatment if needed.  They would like to think about this.  See Problem List for Assessment and Plan of chronic medical problems.

## 2018-10-13 ENCOUNTER — Other Ambulatory Visit (INDEPENDENT_AMBULATORY_CARE_PROVIDER_SITE_OTHER): Payer: Medicare Other

## 2018-10-13 ENCOUNTER — Ambulatory Visit: Payer: Medicare Other | Admitting: Internal Medicine

## 2018-10-13 ENCOUNTER — Encounter: Payer: Self-pay | Admitting: Internal Medicine

## 2018-10-13 VITALS — BP 156/92 | HR 69 | Temp 97.8°F | Resp 14 | Ht 62.0 in | Wt 155.8 lb

## 2018-10-13 DIAGNOSIS — I5042 Chronic combined systolic (congestive) and diastolic (congestive) heart failure: Secondary | ICD-10-CM

## 2018-10-13 DIAGNOSIS — N183 Chronic kidney disease, stage 3 unspecified: Secondary | ICD-10-CM

## 2018-10-13 DIAGNOSIS — Z9013 Acquired absence of bilateral breasts and nipples: Secondary | ICD-10-CM

## 2018-10-13 DIAGNOSIS — E1140 Type 2 diabetes mellitus with diabetic neuropathy, unspecified: Secondary | ICD-10-CM

## 2018-10-13 DIAGNOSIS — Z853 Personal history of malignant neoplasm of breast: Secondary | ICD-10-CM | POA: Insufficient documentation

## 2018-10-13 DIAGNOSIS — I10 Essential (primary) hypertension: Secondary | ICD-10-CM | POA: Diagnosis not present

## 2018-10-13 DIAGNOSIS — E7800 Pure hypercholesterolemia, unspecified: Secondary | ICD-10-CM

## 2018-10-13 DIAGNOSIS — E0842 Diabetes mellitus due to underlying condition with diabetic polyneuropathy: Secondary | ICD-10-CM

## 2018-10-13 DIAGNOSIS — R600 Localized edema: Secondary | ICD-10-CM

## 2018-10-13 DIAGNOSIS — I4821 Permanent atrial fibrillation: Secondary | ICD-10-CM

## 2018-10-13 LAB — CBC WITH DIFFERENTIAL/PLATELET
Basophils Absolute: 0.1 10*3/uL (ref 0.0–0.1)
Basophils Relative: 1 % (ref 0.0–3.0)
Eosinophils Absolute: 0.1 10*3/uL (ref 0.0–0.7)
Eosinophils Relative: 1.2 % (ref 0.0–5.0)
HCT: 40.4 % (ref 36.0–46.0)
Hemoglobin: 13.9 g/dL (ref 12.0–15.0)
Lymphocytes Relative: 27.7 % (ref 12.0–46.0)
Lymphs Abs: 2.2 10*3/uL (ref 0.7–4.0)
MCHC: 34.4 g/dL (ref 30.0–36.0)
MCV: 89.5 fl (ref 78.0–100.0)
MONOS PCT: 7.5 % (ref 3.0–12.0)
Monocytes Absolute: 0.6 10*3/uL (ref 0.1–1.0)
Neutro Abs: 4.9 10*3/uL (ref 1.4–7.7)
Neutrophils Relative %: 62.6 % (ref 43.0–77.0)
Platelets: 226 10*3/uL (ref 150.0–400.0)
RBC: 4.52 Mil/uL (ref 3.87–5.11)
RDW: 14.2 % (ref 11.5–15.5)
WBC: 7.9 10*3/uL (ref 4.0–10.5)

## 2018-10-13 LAB — COMPREHENSIVE METABOLIC PANEL
ALT: 14 U/L (ref 0–35)
AST: 18 U/L (ref 0–37)
Albumin: 4 g/dL (ref 3.5–5.2)
Alkaline Phosphatase: 93 U/L (ref 39–117)
BUN: 25 mg/dL — ABNORMAL HIGH (ref 6–23)
CO2: 28 mEq/L (ref 19–32)
Calcium: 10.1 mg/dL (ref 8.4–10.5)
Chloride: 107 mEq/L (ref 96–112)
Creatinine, Ser: 1.19 mg/dL (ref 0.40–1.20)
GFR: 45.34 mL/min — ABNORMAL LOW (ref >60.00)
Glucose, Bld: 124 mg/dL — ABNORMAL HIGH (ref 70–99)
Potassium: 4.2 mEq/L (ref 3.5–5.1)
Sodium: 142 mEq/L (ref 135–145)
Total Bilirubin: 0.6 mg/dL (ref 0.2–1.2)
Total Protein: 7 g/dL (ref 6.0–8.3)

## 2018-10-13 LAB — HEMOGLOBIN A1C: Hgb A1c MFr Bld: 6.4 % (ref 4.6–6.5)

## 2018-10-13 LAB — LIPID PANEL
Cholesterol: 118 mg/dL (ref 0–200)
HDL: 42.8 mg/dL (ref >39.00)
LDL Cholesterol: 51 mg/dL (ref 0–99)
NonHDL: 75.53
TRIGLYCERIDES: 123 mg/dL (ref 0.0–149.0)
Total CHOL/HDL Ratio: 3
VLDL: 24.6 mg/dL (ref 0.0–40.0)

## 2018-10-13 LAB — TSH: TSH: 2.61 u[IU]/mL (ref 0.35–4.50)

## 2018-10-13 NOTE — Assessment & Plan Note (Signed)
Sugars have been well controlled-check A1c Taking gabapentin Following with podiatry

## 2018-10-13 NOTE — Assessment & Plan Note (Signed)
Blood pressure slightly elevated today, but has been well controlled Continue current medications CMP

## 2018-10-13 NOTE — Assessment & Plan Note (Signed)
Prescription given for mastectomy bras

## 2018-10-13 NOTE — Patient Instructions (Addendum)
  Tests ordered today. Your results will be released to MyChart (or called to you) after review, usually within 72hours after test completion. If any changes need to be made, you will be notified at that same time.  Medications reviewed and updated.  Changes include :   none      Please followup in 6 months   

## 2018-10-13 NOTE — Assessment & Plan Note (Signed)
Appears euvolemic Continue current dose of Lasix CMP

## 2018-10-13 NOTE — Assessment & Plan Note (Signed)
Rate controlled atrial fibrillation Asymptomatic On Eliquis CBC, CMP

## 2018-10-13 NOTE — Assessment & Plan Note (Signed)
Check lipid panel  Continue daily statin Regular activity and healthy diet encouraged

## 2018-10-13 NOTE — Assessment & Plan Note (Signed)
Diet controlled.  Check A1c.

## 2018-10-13 NOTE — Assessment & Plan Note (Signed)
Prescription given for mastectomy bra

## 2018-10-13 NOTE — Assessment & Plan Note (Signed)
Leg edema well controlled Continue Lasix CMP

## 2018-10-13 NOTE — Assessment & Plan Note (Signed)
Currently euvolemic Following with cardiology CMP Continue current medications

## 2018-10-25 ENCOUNTER — Other Ambulatory Visit: Payer: Self-pay | Admitting: Physician Assistant

## 2018-11-01 ENCOUNTER — Other Ambulatory Visit: Payer: Self-pay | Admitting: Ophthalmology

## 2018-11-08 ENCOUNTER — Other Ambulatory Visit: Payer: Self-pay | Admitting: Internal Medicine

## 2018-11-18 ENCOUNTER — Encounter: Payer: Self-pay | Admitting: Internal Medicine

## 2018-11-18 NOTE — Progress Notes (Signed)
Abstracted and sent to scan  

## 2018-11-22 ENCOUNTER — Other Ambulatory Visit: Payer: Self-pay | Admitting: Cardiology

## 2018-11-22 DIAGNOSIS — E7800 Pure hypercholesterolemia, unspecified: Secondary | ICD-10-CM

## 2018-11-22 DIAGNOSIS — E860 Dehydration: Secondary | ICD-10-CM

## 2018-11-22 DIAGNOSIS — I5043 Acute on chronic combined systolic (congestive) and diastolic (congestive) heart failure: Secondary | ICD-10-CM

## 2018-11-22 DIAGNOSIS — I110 Hypertensive heart disease with heart failure: Secondary | ICD-10-CM

## 2018-11-22 DIAGNOSIS — I2510 Atherosclerotic heart disease of native coronary artery without angina pectoris: Secondary | ICD-10-CM

## 2018-11-22 DIAGNOSIS — I10 Essential (primary) hypertension: Secondary | ICD-10-CM

## 2018-11-25 ENCOUNTER — Ambulatory Visit: Payer: Medicare Other | Admitting: Podiatry

## 2018-12-06 ENCOUNTER — Other Ambulatory Visit: Payer: Self-pay | Admitting: Internal Medicine

## 2018-12-13 ENCOUNTER — Encounter: Payer: Self-pay | Admitting: Cardiology

## 2018-12-17 ENCOUNTER — Ambulatory Visit: Payer: Medicare Other | Admitting: Podiatry

## 2018-12-17 DIAGNOSIS — L84 Corns and callosities: Secondary | ICD-10-CM | POA: Diagnosis not present

## 2018-12-17 DIAGNOSIS — E1140 Type 2 diabetes mellitus with diabetic neuropathy, unspecified: Secondary | ICD-10-CM

## 2018-12-17 DIAGNOSIS — B351 Tinea unguium: Secondary | ICD-10-CM

## 2018-12-17 DIAGNOSIS — M79676 Pain in unspecified toe(s): Secondary | ICD-10-CM | POA: Diagnosis not present

## 2018-12-17 NOTE — Patient Instructions (Signed)

## 2018-12-20 ENCOUNTER — Encounter: Payer: Self-pay | Admitting: Podiatry

## 2018-12-20 NOTE — Progress Notes (Signed)
Subjective: Jessica [name] presents today with history of neuropathy with cc of painful, mycotic toenails.  Pain is aggravated when wearing enclosed shoe gear and relieved with periodic professional debridement.  Patient has peripheral neuropathy managed with gabapentin.  Binnie Rail, MD is her PCP and last visit was 10/13/2018.   Current Outpatient Medications:  .  b complex vitamins tablet, Take 1 tablet by mouth daily., Disp: , Rfl:  .  blood glucose meter kit and supplies KIT, Dispense based on patient and insurance preference. Test sugars once daily and prn as directed. (FOR ICD-9 250.00, 250.01)., Disp: 1 each, Rfl: 0 .  carvedilol (COREG) 12.5 MG tablet, TAKE 1 TABLET BY MOUTH TWICE DAILY, Disp: 180 tablet, Rfl: 2 .  docusate sodium (COLACE) 100 MG capsule, Take 100 mg by mouth at bedtime., Disp: , Rfl:  .  ECHINACEA PO, Take 750 mg by mouth daily., Disp: , Rfl:  .  ELIQUIS 5 MG TABS tablet, TAKE 1 TABLET BY MOUTH TWICE DAILY, Disp: 180 tablet, Rfl: 3 .  erythromycin ophthalmic ointment, APPLY TO EYELIDS ONCE DAILY AT BEDTIME, Disp: , Rfl:  .  fish oil-omega-3 fatty acids 1000 MG capsule, Take 1 g by mouth every morning. , Disp: , Rfl:  .  furosemide (LASIX) 20 MG tablet, Take 2 tablets by mouth every morning and one tablet every afternoon, Disp: 270 tablet, Rfl: 2 .  gabapentin (NEURONTIN) 100 MG capsule, TAKE 1 CAPSULE AT NIGHT (TAKE IN ADDITION TO 300 MG CAPSULE AT NIGHT), Disp: 90 capsule, Rfl: 0 .  gabapentin (NEURONTIN) 300 MG capsule, TAKE 1 CAPSULE BY MOUTH THREE TIMES DAILY, Disp: 270 capsule, Rfl: 0 .  lisinopril (PRINIVIL,ZESTRIL) 5 MG tablet, Take 1 tablet (5 mg total) by mouth daily., Disp: 90 tablet, Rfl: 3 .  lisinopril (PRINIVIL,ZESTRIL) 5 MG tablet, Take 5 mg by mouth daily., Disp: , Rfl:  .  Misc. Devices (ROLLATOR ULTRA-LIGHT) MISC, With seat.  Diabetic neuropathy, knee arthritis, Disp: 1 each, Rfl: 0 .  nitroGLYCERIN (NITROSTAT) 0.4 MG SL tablet, Place 1 tablet  (0.4 mg total) under the tongue every 5 (five) minutes as needed for chest pain. x3 doses as needed for chest pain, Disp: 25 tablet, Rfl: 0 .  potassium chloride SA (K-DUR,KLOR-CON) 20 MEQ tablet, TAKE 1 TABLET BY MOUTH ONCE DAILY, Disp: 90 tablet, Rfl: 1 .  rosuvastatin (CRESTOR) 5 MG tablet, TAKE 1 TABLET BY MOUTH AT BEDTIME, Disp: 90 tablet, Rfl: 2  Allergies  Allergen Reactions  . Lyrica [Pregabalin] Swelling    Caused weight gain  . Amlodipine Swelling  . Sulfonamide Derivatives Rash    Objective:  Vascular Examination: Capillary refill time <3 seconds x 10 digits Dorsalis pedis and Posterior tibial pulses palpable b/l Digital hair x 10 digits was absent Skin temperature gradient WNL b/l  Dermatological Examination: Skin with normal turgor, texture and tone b/l  Toenails 1-5 b/l discolored, thick, dystrophic with subungual debris and pain with palpation to nailbeds due to thickness of nails.  Hyperkeratotic lesion distal tip right 4th digit, submetatarsal head 2 left foot, distal tip left 3rd digit. No erythema, no edema, no drainage, no flocculence.  Musculoskeletal: Muscle strength 5/5 to all muscle groups b/l  Neurological: Sensation with 10 gram monofilament is decreased  b/l  Assessment: 1. Painful onychomycosis toenails 1-5 b/l 2. Callus submet head 2 left foot 3. Corns distal 4th digit right foot, distal 3rd digit left foot 4. NIDDM with neuropathy  Plan: 1. Toenails 1-5 b/l were debrided in  length and girth without iatrogenic bleeding. 2. Callus/corns pared submetatarsal head(s) 2 left foot, distal 4th digit right foot, distal 3rd digit left foot  3. Patient to continue soft, supportive shoe gear. 4. Patient to report any pedal injuries to medical professional  5. Follow up 3 months.  6. Patient/POA to call should there be a concern in the interim.

## 2018-12-24 ENCOUNTER — Ambulatory Visit (INDEPENDENT_AMBULATORY_CARE_PROVIDER_SITE_OTHER): Payer: Medicare Other

## 2018-12-24 DIAGNOSIS — I495 Sick sinus syndrome: Secondary | ICD-10-CM | POA: Diagnosis not present

## 2018-12-25 LAB — CUP PACEART REMOTE DEVICE CHECK
Battery Impedance: 1035 Ohm
Battery Remaining Longevity: 58 mo
Battery Voltage: 2.78 V
Brady Statistic AP VS Percent: 0 %
Brady Statistic AS VS Percent: 88 %
Date Time Interrogation Session: 20200220205152
Implantable Lead Implant Date: 20110912
Implantable Lead Implant Date: 20110912
Implantable Lead Location: 753859
Implantable Lead Location: 753860
Implantable Lead Model: 5076
Implantable Lead Model: 5092
Implantable Pulse Generator Implant Date: 20110912
Lead Channel Pacing Threshold Amplitude: 0.5 V
Lead Channel Pacing Threshold Amplitude: 0.75 V
Lead Channel Pacing Threshold Pulse Width: 0.4 ms
Lead Channel Pacing Threshold Pulse Width: 0.4 ms
Lead Channel Setting Pacing Amplitude: 2 V
Lead Channel Setting Pacing Amplitude: 2.5 V
Lead Channel Setting Pacing Pulse Width: 0.4 ms
Lead Channel Setting Sensing Sensitivity: 2 mV
MDC IDC MSMT LEADCHNL RA IMPEDANCE VALUE: 501 Ohm
MDC IDC MSMT LEADCHNL RV IMPEDANCE VALUE: 667 Ohm
MDC IDC STAT BRADY AP VP PERCENT: 6 %
MDC IDC STAT BRADY AS VP PERCENT: 6 %

## 2018-12-27 ENCOUNTER — Other Ambulatory Visit: Payer: Medicare Other

## 2018-12-27 DIAGNOSIS — I2510 Atherosclerotic heart disease of native coronary artery without angina pectoris: Secondary | ICD-10-CM

## 2018-12-27 DIAGNOSIS — I480 Paroxysmal atrial fibrillation: Secondary | ICD-10-CM

## 2018-12-27 DIAGNOSIS — I5042 Chronic combined systolic (congestive) and diastolic (congestive) heart failure: Secondary | ICD-10-CM

## 2018-12-28 LAB — TSH: TSH: 4.31 u[IU]/mL (ref 0.450–4.500)

## 2018-12-28 LAB — COMPREHENSIVE METABOLIC PANEL
ALT: 16 IU/L (ref 0–32)
AST: 24 IU/L (ref 0–40)
Albumin/Globulin Ratio: 1.5 (ref 1.2–2.2)
Albumin: 4 g/dL (ref 3.6–4.6)
Alkaline Phosphatase: 113 IU/L (ref 39–117)
BUN/Creatinine Ratio: 17 (ref 12–28)
BUN: 21 mg/dL (ref 8–27)
Bilirubin Total: 0.5 mg/dL (ref 0.0–1.2)
CO2: 25 mmol/L (ref 20–29)
Calcium: 10.2 mg/dL (ref 8.7–10.3)
Chloride: 104 mmol/L (ref 96–106)
Creatinine, Ser: 1.22 mg/dL — ABNORMAL HIGH (ref 0.57–1.00)
GFR calc Af Amer: 45 mL/min/{1.73_m2} — ABNORMAL LOW (ref >59)
GFR calc non Af Amer: 39 mL/min/{1.73_m2} — ABNORMAL LOW (ref >59)
Globulin, Total: 2.7 g/dL (ref 1.5–4.5)
Glucose: 90 mg/dL (ref 65–99)
Potassium: 4.2 mmol/L (ref 3.5–5.2)
Sodium: 144 mmol/L (ref 134–144)
Total Protein: 6.7 g/dL (ref 6.0–8.5)

## 2018-12-28 LAB — CBC WITH DIFFERENTIAL/PLATELET
Basophils Absolute: 0.1 10*3/uL (ref 0.0–0.2)
Basos: 1 %
EOS (ABSOLUTE): 0.1 10*3/uL (ref 0.0–0.4)
Eos: 2 %
Hematocrit: 39.4 % (ref 34.0–46.6)
Hemoglobin: 13 g/dL (ref 11.1–15.9)
Immature Grans (Abs): 0 10*3/uL (ref 0.0–0.1)
Immature Granulocytes: 0 %
Lymphocytes Absolute: 1.8 10*3/uL (ref 0.7–3.1)
Lymphs: 29 %
MCH: 30.4 pg (ref 26.6–33.0)
MCHC: 33 g/dL (ref 31.5–35.7)
MCV: 92 fL (ref 79–97)
Monocytes Absolute: 0.5 10*3/uL (ref 0.1–0.9)
Monocytes: 7 %
Neutrophils Absolute: 4 10*3/uL (ref 1.4–7.0)
Neutrophils: 61 %
Platelets: 225 10*3/uL (ref 150–450)
RBC: 4.27 x10E6/uL (ref 3.77–5.28)
RDW: 13.6 % (ref 11.7–15.4)
WBC: 6.4 10*3/uL (ref 3.4–10.8)

## 2018-12-30 NOTE — Progress Notes (Signed)
Remote pacemaker transmission.   

## 2018-12-31 ENCOUNTER — Encounter: Payer: Self-pay | Admitting: Cardiology

## 2019-01-03 ENCOUNTER — Ambulatory Visit: Payer: Medicare Other | Admitting: Cardiology

## 2019-01-03 ENCOUNTER — Encounter: Payer: Self-pay | Admitting: Cardiology

## 2019-01-03 VITALS — BP 148/78 | HR 79 | Ht 63.0 in | Wt 159.0 lb

## 2019-01-03 DIAGNOSIS — I2510 Atherosclerotic heart disease of native coronary artery without angina pectoris: Secondary | ICD-10-CM

## 2019-01-03 NOTE — Progress Notes (Signed)
Cardiology Office Note    Date:  01/03/2019  ID:  Jessica [name], DOB Feb 11, 1927, MRN [id number] PCP:  Binnie Rail, MD  Cardiologist:  Ena Dawley, MD   Chief Complaint: f/u CHF  History of Present Illness:  Jessica [name] is a 85 y.o. female with history of CAD (DESx2 to mid and distal RCA in 2010), chronic combined CHF, hypertensive heart disease, DM with peripheral neuropathy, HTN, HLD, SSS s/p Medtronic pacemaker 2011, permanent atrial fib (discovered on device interrogation 10/2014), CKD stage III, moderate pulm HTN (by last echo, difficulty complying with fluid restriction who presents f/u of CHF.  She was hospitalized for acute diastolic CHF in 11/3242 in the setting of accelerated HTN and PAF. She underwent DCCV at that time. Echo 06/2015: EF 55-60%, no RWMA, mild AI, mild-mod MR, mild LAE/RAE, mildly reduced RV function, mod TR, PASP 33mHg. She was managed at the end of 2017 with worsening CHF in the setting of repeated noncompliance with fluid restriction (up to 4L of water per day). Pacer interrogation at that time showed very low AF burden of 0.6%.  In 2018, her device interrogation 12/03/16 showed increased spike in atrial fib burden to 54.8% and she required additional diuresis for CHF. Her subsequent device interrogations show 100% AT/AF burden. This has been followed by Dr. ARayann Hemanand it appears a rate control strategy was undertaken.There has been prior issue trying to find a balance between euvolemia and renal function/blood pressure.   Admitted in 10/2017 for acute on chronic combined CHF with new cardiomyopathy with EF 30-35%, moderate diffuse HK, mild AI, mild MR, mildly increased PASP. The patient and daughter wished for conservative management instead of cath.   03/2018- she returns for follow-up with daughter Jessica [name] In general they feel she is doing well. Her major complaint is neuropathic pain in her feet for which her gabapentin was increased recently with some  improvement. Her legs swell from time to time but in general they feel she's doing well. She denies any SOB. She has been drinking 4-5 16oz bottles of water per day.  07/02/2018 -she comes with her daughter, she looks very happy and states that she has been feeling well. No lower extremity edema, no orthopnea or paroxysmal nocturnal dyspnea.  She is walking with a walker and has good appetite.  Her weight is unchanged.  She denies any chest pain.   01/03/2019 -patient is coming after 6 months, she has been enjoying her life, she denies any chest pain shortness of breath, her lower extremity edema has resolved, she was recently seen in the EP clinic for pacemaker checkup that is functioning well.  She has great appetite and in fact gained weight recently.   Past Medical History:  Diagnosis Date  . Breast cancer ([place]   . CAD (coronary artery disease)    a. s/p DESx2 to mid and distal RCA in 2010.  .Marland KitchenCholelithiasis 09/12/2013   Lap Chole on 09/14/13   . Chronic combined systolic and diastolic CHF (congestive heart failure) ([place]    a. Echo 06/09/15: EF 55-60% -> 10/2017 EF 30-35% (pt and daughter did not wish to proceed with ischemic assessment - conservative rx).  . CKD (chronic kidney disease), stage III ([place]   . Diabetes mellitus    NON INSULIN DEPENDENT  . DJD (degenerative joint disease)   . GERD (gastroesophageal reflux disease)   . Hemorrhoids   . History of diabetic neuropathy   . Hypercholesterolemia   . Hypertension   .  Hypertensive heart disease   . Persistent atrial fibrillation    a. discovered on PPM interrogation 12/15, CHAD2VASC score is 6. b. decompensation with dCHF in 06/2015 possibly due to AF, s/p DCCV.Marland Kitchen c. 2018-2019 increase in burden.  . SSS (sick sinus syndrome) ([place])    a. s/p Medtronic PPM by JA for SSS and syncope 9/11.    Past Surgical History:  Procedure Laterality Date  . CARDIAC CATHETERIZATION  07/05/2009   EF 55-60%  . CARDIOVERSION N/A 06/11/2015    Procedure: CARDIOVERSION;  Surgeon: [name], MD;  Location[facility] ENDOSCOPY;  Service: Cardiovascular;  Laterality: N/A;  . CHOLECYSTECTOMY N/A 09/14/2013   Procedure: LAPAROSCOPIC CHOLECYSTECTOMY WITH INTRAOPERATIVE CHOLANGIOGRAM;  Surgeon: [name], MD;  Location: [place];  Service: General;  Laterality: N/A;  . COLONOSCOPY    . ERCP N/A 09/13/2013   Procedure: ENDOSCOPIC RETROGRADE CHOLANGIOPANCREATOGRAPHY (ERCP);  Surgeon: [name], MD;  Location: [facility] ENDOSCOPY;  Service: Endoscopy;  Laterality: N/A;  . INSERT / REPLACE / REMOVE PACEMAKER  9/11   SSS and syncope, implanted by JA (MDT)  . MASTECTOMY  1992   BILATERAL WITH RECONSTRUCTION    Current Medications: Current Meds  Medication Sig  . b complex vitamins tablet Take 1 tablet by mouth daily.  . blood glucose meter kit and supplies KIT Dispense based on patient and insurance preference. Test sugars once daily and prn as directed. (FOR ICD-9 250.00, 250.01).  . carvedilol (COREG) 12.5 MG tablet TAKE 1 TABLET BY MOUTH TWICE DAILY  . docusate sodium (COLACE) 100 MG capsule Take 100 mg by mouth at bedtime.  Marland Kitchen ECHINACEA PO Take 750 mg by mouth daily.  Marland Kitchen ELIQUIS 5 MG TABS tablet TAKE 1 TABLET BY MOUTH TWICE DAILY  . erythromycin ophthalmic ointment APPLY TO EYELIDS ONCE DAILY AT BEDTIME  . fish oil-omega-3 fatty acids 1000 MG capsule Take 1 g by mouth every morning.   . furosemide (LASIX) 20 MG tablet Take 2 tablets by mouth every morning and one tablet every afternoon  . gabapentin (NEURONTIN) 100 MG capsule TAKE 1 CAPSULE AT NIGHT (TAKE IN ADDITION TO 300 MG CAPSULE AT NIGHT)  . gabapentin (NEURONTIN) 300 MG capsule TAKE 1 CAPSULE BY MOUTH THREE TIMES DAILY  . Misc. Devices (ROLLATOR ULTRA-LIGHT) MISC With seat.  Diabetic neuropathy, knee arthritis  . nitroGLYCERIN (NITROSTAT) 0.4 MG SL tablet Place 1 tablet (0.4 mg total) under the tongue every 5 (five) minutes as needed for chest pain. x3 doses as needed for chest  pain  . potassium chloride SA (K-DUR,KLOR-CON) 20 MEQ tablet TAKE 1 TABLET BY MOUTH ONCE DAILY  . rosuvastatin (CRESTOR) 5 MG tablet TAKE 1 TABLET BY MOUTH AT BEDTIME    Allergies:   Lyrica [pregabalin]; Amlodipine; and Sulfonamide derivatives   Social History   Socioeconomic History  . Marital status: Widowed    Spouse name: Not on file  . Number of children: Not on file  . Years of education: Not on file  . Highest education level: Not on file  Occupational History  . Not on file  Social Needs  . Financial resource strain: Not on file  . Food insecurity:    Worry: Not on file    Inability: Not on file  . Transportation needs:    Medical: Not on file    Non-medical: Not on file  Tobacco Use  . Smoking status: Never Smoker  . Smokeless tobacco: Never Used  Substance and Sexual Activity  . Alcohol use: No  Alcohol/week: 0.0 standard drinks  . Drug use: No  . Sexual activity: Not on file  Lifestyle  . Physical activity:    Days per week: Not on file    Minutes per session: Not on file  . Stress: Not on file  Relationships  . Social connections:    Talks on phone: Not on file    Gets together: Not on file    Attends religious service: Not on file    Active member of club or organization: Not on file    Attends meetings of clubs or organizations: Not on file    Relationship status: Not on file  Other Topics Concern  . Not on file  Social History Narrative  . Not on file     Family History:  The patient's family history includes Cancer in her father and mother.  ROS:   Please see the history of present illness. No chest pain All other systems are reviewed and otherwise negative.    PHYSICAL EXAM:   VS:  BP (!) 148/78   Pulse 79   Ht 5' 3" (1.6 m)   Wt 159 lb (72.1 kg)   LMP  (LMP Unknown)   SpO2 96%   BMI 28.17 kg/m   BMI: Body mass index is 28.17 kg/m. GEN: Well nourished, well developed elderly WF, in no acute distress HEENT: normocephalic,  atraumatic Neck: no JVD, carotid bruits, or masses Cardiac: RRR; no murmurs, rubs, or gallops, no edema  Respiratory:  clear to auscultation bilaterally, normal work of breathing GI: rounded but soft, nontender, + BS MS: no deformity or atrophy Skin: warm and dry, no rash Neuro:  Alert and Oriented x 3, Strength and sensation are intact, follows commands Psych: euthymic mood, full affect  Wt Readings from Last 3 Encounters:  01/03/19 159 lb (72.1 kg)  10/13/18 155 lb 12.8 oz (70.7 kg)  09/20/18 155 lb (70.3 kg)      Studies/Labs Reviewed:   EKG:  EKG was ordered today and personally reviewed by me and demonstrates V paced rhythm 70bpm, TWI avF  Recent Labs: 01/08/2018: NT-Pro BNP 10,443 12/27/2018: ALT 16; BUN 21; Creatinine, Ser 1.22; Hemoglobin 13.0; Platelets 225; Potassium 4.2; Sodium 144; TSH 4.310   Lipid Panel    Component Value Date/Time   CHOL 118 10/13/2018 1558   TRIG 123.0 10/13/2018 1558   HDL 42.80 10/13/2018 1558   CHOLHDL 3 10/13/2018 1558   VLDL 24.6 10/13/2018 1558   LDLCALC 51 10/13/2018 1558   LDLDIRECT 152.0 10/10/2013 1619    Additional studies/ records that were reviewed today include: Summarized above.    ASSESSMENT & PLAN:   1. Chronic combined CHF -she appears euvolemic, I will continue the same dose of Lasix 40 mg daily and 20 mEq of potassium.  Her creatinine was 1.2 and stable last week.  Normal electrolytes.   2. Permanent atrial fib , tachybradycardia syndrome status post pacemaker placement- rate controlled. Continue apixaban.  No bleeding most recent hemoglobin is 13. 3. CAD - no recent angina. Suspect not on ASA due to concomitant apixaban. Hyperlipidemia -tolerating rosuvastatin and no memory impairment.  We will continue.  Disposition: F/u with Dr. [name] in 6 months.   Medication Adjustments/Labs and Tests Ordered: Current medicines are reviewed at length with the patient today.  Concerns regarding medicines are outlined above.  Medication changes, Labs and Tests ordered today are summarized above and listed in the Patient Instructions accessible in Encounters.   Signed, Ena Dawley, MD  01/03/2019  3:18 PM    [facility] [address], [place], [place]  [address] Phone: [phone]; Fax: [phone]

## 2019-01-03 NOTE — Patient Instructions (Signed)
Medication Instructions:   Your physician recommends that you continue on your current medications as directed. Please refer to the Current Medication list given to you today.  If you need a refill on your cardiac medications before your next appointment, please call your pharmacy.       Follow-Up: At [facility], you and your health needs are our priority.  As part of our continuing mission to provide you with exceptional heart care, we have created designated Provider Care Teams.  These Care Teams include your primary Cardiologist (physician) and Advanced Practice Providers (APPs -  Physician Assistants and Nurse Practitioners) who all work together to provide you with the care you need, when you need it. You will need a follow up appointment in 6 months.  Please call our office 2 months in advance to schedule this appointment.  You may see Katarina Nelson, MD or one of the following Advanced Practice Providers on your designated Care Team:   Brittainy Simmons, PA-C Dayna Dunn, PA-C . Michele Lenze, PA-C     

## 2019-01-31 ENCOUNTER — Other Ambulatory Visit: Payer: Self-pay | Admitting: Physician Assistant

## 2019-02-28 ENCOUNTER — Other Ambulatory Visit: Payer: Self-pay | Admitting: Internal Medicine

## 2019-03-03 ENCOUNTER — Other Ambulatory Visit: Payer: Self-pay | Admitting: Internal Medicine

## 2019-03-07 ENCOUNTER — Other Ambulatory Visit: Payer: Self-pay | Admitting: Internal Medicine

## 2019-03-10 ENCOUNTER — Other Ambulatory Visit: Payer: Self-pay | Admitting: Internal Medicine

## 2019-03-17 ENCOUNTER — Ambulatory Visit: Payer: Medicare Other | Admitting: Podiatry

## 2019-03-24 ENCOUNTER — Ambulatory Visit: Payer: Medicare Other | Admitting: Podiatry

## 2019-03-25 ENCOUNTER — Telehealth: Payer: Self-pay

## 2019-03-25 ENCOUNTER — Encounter: Payer: Medicare Other | Admitting: *Deleted

## 2019-03-25 NOTE — Telephone Encounter (Signed)
Left message for patient to remind of missed remote transmission.  

## 2019-04-05 ENCOUNTER — Encounter: Payer: Self-pay | Admitting: Cardiology

## 2019-04-12 ENCOUNTER — Encounter: Payer: Self-pay | Admitting: Podiatry

## 2019-04-12 ENCOUNTER — Other Ambulatory Visit: Payer: Self-pay

## 2019-04-12 ENCOUNTER — Ambulatory Visit: Payer: Medicare Other | Admitting: Podiatry

## 2019-04-12 DIAGNOSIS — Q828 Other specified congenital malformations of skin: Secondary | ICD-10-CM

## 2019-04-12 DIAGNOSIS — E1142 Type 2 diabetes mellitus with diabetic polyneuropathy: Secondary | ICD-10-CM | POA: Diagnosis not present

## 2019-04-12 DIAGNOSIS — B351 Tinea unguium: Secondary | ICD-10-CM

## 2019-04-12 DIAGNOSIS — M79676 Pain in unspecified toe(s): Secondary | ICD-10-CM | POA: Diagnosis not present

## 2019-04-12 NOTE — Progress Notes (Signed)
Subjective:    Patient ID: Jessica [name], female    DOB: [birth year], 85 y.o.   MRN: [id number]  HPI The patient is here for follow up.  She is here today with her daughter.  She is not exercising regularly.   She has gained weight.  She has no concerns.  CHF, Afib, Hypertension: She is taking her medication daily. She is compliant with a low sodium diet.  Her leg edema is controlled.  She denies chest pain, palpitations, shortness of breath and regular headaches. She does not monitor her blood pressure at home.    Diabetes with neuropathy: She is taking her medication daily as prescribed. She takes gabapentin fo rhe rneuropathy.  She is compliant with a diabetic diet.  She monitors her sugars and they have been running 160's this morning.  Usually it is around 120-130. She is up-to-date with an ophthalmology examination.   Hyperlipidemia: She is taking her medication daily. She is compliant with a low fat/cholesterol diet. She denies myalgias.    CKD:  She drinks water throughout the day - most days 3-4 a day.       Medications and allergies reviewed with patient and updated if appropriate.  Patient Active Problem List   Diagnosis Date Noted  . H/O bilateral mastectomy 10/13/2018  . HX: breast cancer 10/13/2018  . Permanent atrial fibrillation 10/31/2017  . Cardiac pacemaker in situ   . Chronic combined systolic and diastolic CHF (congestive heart failure) ([place]) 10/12/2017  . Bilateral leg edema 04/29/2016  . CKD (chronic kidney disease) stage 3, GFR 30-59 ml/min ([facility]) 06/10/2015  . CAD (coronary artery disease) 06/09/2015  . Diabetic neuropathy ([place]) 07/06/2013  . SSS (sick sinus syndrome) ([place]) 02/10/2013  . Hypertension 07/30/2012  . Pacemaker-Medtronic 07/19/2012  . Benign hypertensive heart disease with heart failure ([place]) 01/28/2012  . Diabetes ([place]) 10/22/2010  . HYPERCHOLESTEROLEMIA 10/22/2010  . DEGENERATIVE JOINT DISEASE 10/22/2010    Current Outpatient  Medications on File Prior to Visit  Medication Sig Dispense Refill  . b complex vitamins tablet Take 1 tablet by mouth daily.    . blood glucose meter kit and supplies KIT Dispense based on patient and insurance preference. Test sugars once daily and prn as directed. (FOR ICD-9 250.00, 250.01). 1 each 0  . carvedilol (COREG) 12.5 MG tablet TAKE 1 TABLET BY MOUTH TWICE DAILY 180 tablet 2  . docusate sodium (COLACE) 100 MG capsule Take 100 mg by mouth at bedtime.    Marland Kitchen ECHINACEA PO Take 750 mg by mouth daily.    Marland Kitchen ELIQUIS 5 MG TABS tablet TAKE 1 TABLET BY MOUTH TWICE DAILY 180 tablet 3  . erythromycin ophthalmic ointment APPLY TO EYELIDS ONCE DAILY AT BEDTIME    . fish oil-omega-3 fatty acids 1000 MG capsule Take 1 g by mouth every morning.     . furosemide (LASIX) 20 MG tablet Take 2 tablets by mouth every morning and one tablet every afternoon 270 tablet 2  . gabapentin (NEURONTIN) 100 MG capsule TAKE 1 CAPSULE BY MOUTH AT NIGHT (TAKE IN ADDITION TO 300 MG CAPSULE AT NIGHT)- Needs follow up for more refills. 30 capsule 0  . gabapentin (NEURONTIN) 300 MG capsule TAKE 1 CAPSULE BY MOUTH THREE TIMES DAILY 270 capsule 0  . lisinopril (PRINIVIL,ZESTRIL) 5 MG tablet Take 1 tablet by mouth once daily 90 tablet 3  . Misc. Devices (ROLLATOR ULTRA-LIGHT) MISC With seat.  Diabetic neuropathy, knee arthritis 1 each 0  . nitroGLYCERIN (NITROSTAT) 0.4  MG SL tablet Place 1 tablet (0.4 mg total) under the tongue every 5 (five) minutes as needed for chest pain. x3 doses as needed for chest pain 25 tablet 0  . potassium chloride SA (K-DUR,KLOR-CON) 20 MEQ tablet TAKE 1 TABLET BY MOUTH ONCE DAILY 90 tablet 1  . rosuvastatin (CRESTOR) 5 MG tablet TAKE 1 TABLET BY MOUTH AT BEDTIME 90 tablet 2   No current facility-administered medications on file prior to visit.     Past Medical History:  Diagnosis Date  . Breast cancer ([place])   . CAD (coronary artery disease)    a. s/p DESx2 to mid and distal RCA in 2010.  Marland Kitchen  Cholelithiasis 09/12/2013   Lap Chole on 09/14/13   . Chronic combined systolic and diastolic CHF (congestive heart failure) ([place])    a. Echo 06/09/15: EF 55-60% -> 10/2017 EF 30-35% (pt and daughter did not wish to proceed with ischemic assessment - conservative rx).  . CKD (chronic kidney disease), stage III ([place])   . Diabetes mellitus    NON INSULIN DEPENDENT  . DJD (degenerative joint disease)   . GERD (gastroesophageal reflux disease)   . Hemorrhoids   . History of diabetic neuropathy   . Hypercholesterolemia   . Hypertension   . Hypertensive heart disease   . Persistent atrial fibrillation    a. discovered on PPM interrogation 12/15, CHAD2VASC score is 6. b. decompensation with dCHF in 06/2015 possibly due to AF, s/p DCCV.Marland Kitchen c. 2018-2019 increase in burden.  . SSS (sick sinus syndrome) ([place])    a. s/p Medtronic PPM by JA for SSS and syncope 9/11.    Past Surgical History:  Procedure Laterality Date  . CARDIAC CATHETERIZATION  07/05/2009   EF 55-60%  . CARDIOVERSION N/A 06/11/2015   Procedure: CARDIOVERSION;  Surgeon: [name], MD;  Location: [facility] ENDOSCOPY;  Service: Cardiovascular;  Laterality: N/A;  . CHOLECYSTECTOMY N/A 09/14/2013   Procedure: LAPAROSCOPIC CHOLECYSTECTOMY WITH INTRAOPERATIVE CHOLANGIOGRAM;  Surgeon: [name], MD;  Location: [place];  Service: General;  Laterality: N/A;  . COLONOSCOPY    . ERCP N/A 09/13/2013   Procedure: ENDOSCOPIC RETROGRADE CHOLANGIOPANCREATOGRAPHY (ERCP);  Surgeon: [name], MD;  Location: [facility] ENDOSCOPY;  Service: Endoscopy;  Laterality: N/A;  . INSERT / REPLACE / REMOVE PACEMAKER  9/11   SSS and syncope, implanted by JA (MDT)  . MASTECTOMY  1992   BILATERAL WITH RECONSTRUCTION    Social History   Socioeconomic History  . Marital status: Widowed    Spouse name: Not on file  . Number of children: Not on file  . Years of education: Not on file  . Highest education level: Not on file  Occupational History  . Not on  file  Social Needs  . Financial resource strain: Not on file  . Food insecurity:    Worry: Not on file    Inability: Not on file  . Transportation needs:    Medical: Not on file    Non-medical: Not on file  Tobacco Use  . Smoking status: Never Smoker  . Smokeless tobacco: Never Used  Substance and Sexual Activity  . Alcohol use: No    Alcohol/week: 0.0 standard drinks  . Drug use: No  . Sexual activity: Not on file  Lifestyle  . Physical activity:    Days per week: Not on file    Minutes per session: Not on file  . Stress: Not on file  Relationships  . Social connections:    Talks  on phone: Not on file    Gets together: Not on file    Attends religious service: Not on file    Active member of club or organization: Not on file    Attends meetings of clubs or organizations: Not on file    Relationship status: Not on file  Other Topics Concern  . Not on file  Social History Narrative  . Not on file    Family History  Problem Relation Age of Onset  . Cancer Mother   . Cancer Father     Review of Systems  Constitutional: Negative for chills and fever.  Respiratory: Negative for cough, shortness of breath and wheezing.   Cardiovascular: Positive for leg swelling (stable). Negative for chest pain and palpitations.  Neurological: Negative for light-headedness and headaches.       Objective:   Vitals:   04/13/19 1508  BP: (!) 146/86  Pulse: 83  Resp: 16  Temp: 98.1 F (36.7 C)  SpO2: 95%   BP Readings from Last 3 Encounters:  04/13/19 (!) 146/86  01/03/19 (!) 148/78  10/13/18 (!) 156/92   Wt Readings from Last 3 Encounters:  04/13/19 157 lb (71.2 kg)  01/03/19 159 lb (72.1 kg)  10/13/18 155 lb 12.8 oz (70.7 kg)   Body mass index is 27.81 kg/m.   Physical Exam    Constitutional: Appears well-developed and well-nourished. No distress.  HENT:  Head: Normocephalic and atraumatic.  Neck: Neck supple. No tracheal deviation present. No thyromegaly  present.  No cervical lymphadenopathy Cardiovascular: Normal rate, regular rhythm and normal heart sounds.   No murmur heard. No carotid bruit .  1+ pitting edema one third of the way up the leg bilaterally Pulmonary/Chest: Effort normal and breath sounds normal. No respiratory distress. No has no wheezes. No rales.  Skin: Skin is warm and dry. Not diaphoretic.  Psychiatric: Normal mood and affect. Behavior is normal.      Assessment & Plan:    See Problem List for Assessment and Plan of chronic medical problems.

## 2019-04-12 NOTE — Patient Instructions (Addendum)
Onychomycosis/Fungal Toenails  WHAT IS IT? An infection that lies within the keratin of your nail plate that is caused by a fungus.  WHY ME? Fungal infections affect all ages, sexes, races, and creeds.  There may be many factors that predispose you to a fungal infection such as age, coexisting medical conditions such as diabetes, or an autoimmune disease; stress, medications, fatigue, genetics, etc.  Bottom line: fungus thrives in a warm, moist environment and your shoes offer such a location.  IS IT CONTAGIOUS? Theoretically, yes.  You do not want to share shoes, nail clippers or files with someone who has fungal toenails.  Walking around barefoot in the same room or sleeping in the same bed is unlikely to transfer the organism.  It is important to realize, however, that fungus can spread easily from one nail to the next on the same foot.  HOW DO WE TREAT THIS?  There are several ways to treat this condition.  Treatment may depend on many factors such as age, medications, pregnancy, liver and kidney conditions, etc.  It is best to ask your doctor which options are available to you.  1. No treatment.   Unlike many other medical concerns, you can live with this condition.  However for many people this can be a painful condition and may lead to ingrown toenails or a bacterial infection.  It is recommended that you keep the nails cut short to help reduce the amount of fungal nail. 2. Topical treatment.  These range from herbal remedies to prescription strength nail lacquers.  About 40-50% effective, topicals require twice daily application for approximately 9 to 12 months or until an entirely new nail has grown out.  The most effective topicals are medical grade medications available through physicians offices. 3. Oral antifungal medications.  With an 80-90% cure rate, the most common oral medication requires 3 to 4 months of therapy and stays in your system for a year as the new nail grows out.  Oral  antifungal medications do require blood work to make sure it is a safe drug for you.  A liver function panel will be performed prior to starting the medication and after the first month of treatment.  It is important to have the blood work performed to avoid any harmful side effects.  In general, this medication safe but blood work is required. 4. Laser Therapy.  This treatment is performed by applying a specialized laser to the affected nail plate.  This therapy is noninvasive, fast, and non-painful.  It is not covered by insurance and is therefore, out of pocket.  The results have been very good with a 80-95% cure rate.  [facility] is the only practice in the area to offer this therapy. 5. Permanent Nail Avulsion.  Removing the entire nail so that a new nail will not grow back.  Diabetes Mellitus and Foot Care Foot care is an important part of your health, especially when you have diabetes. Diabetes may cause you to have problems because of poor blood flow (circulation) to your feet and legs, which can cause your skin to:  Become thinner and drier.  Break more easily.  Heal more slowly.  Peel and crack. You may also have nerve damage (neuropathy) in your legs and feet, causing decreased feeling in them. This means that you may not notice minor injuries to your feet that could lead to more serious problems. Noticing and addressing any potential problems early is the best way to prevent   future foot problems. How to care for your feet Foot hygiene  Wash your feet daily with warm water and mild soap. Do not use hot water. Then, pat your feet and the areas between your toes until they are completely dry. Do not soak your feet as this can dry your skin.  Trim your toenails straight across. Do not dig under them or around the cuticle. File the edges of your nails with an emery board or nail file.  Apply a moisturizing lotion or petroleum jelly to the skin on your feet and to dry, brittle  toenails. Use lotion that does not contain alcohol and is unscented. Do not apply lotion between your toes. Shoes and socks  Wear clean socks or stockings every day. Make sure they are not too tight. Do not wear knee-high stockings since they may decrease blood flow to your legs.  Wear shoes that fit properly and have enough cushioning. Always look in your shoes before you put them on to be sure there are no objects inside.  To break in new shoes, wear them for just a few hours a day. This prevents injuries on your feet. Wounds, scrapes, corns, and calluses  Check your feet daily for blisters, cuts, bruises, sores, and redness. If you cannot see the bottom of your feet, use a mirror or ask someone for help.  Do not cut corns or calluses or try to remove them with medicine.  If you find a minor scrape, cut, or break in the skin on your feet, keep it and the skin around it clean and dry. You may clean these areas with mild soap and water. Do not clean the area with peroxide, alcohol, or iodine.  If you have a wound, scrape, corn, or callus on your foot, look at it several times a day to make sure it is healing and not infected. Check for: ? Redness, swelling, or pain. ? Fluid or blood. ? Warmth. ? Pus or a bad smell. General instructions  Do not cross your legs. This may decrease blood flow to your feet.  Do not use heating pads or hot water bottles on your feet. They may burn your skin. If you have lost feeling in your feet or legs, you may not know this is happening until it is too late.  Protect your feet from hot and cold by wearing shoes, such as at the beach or on hot pavement.  Schedule a complete foot exam at least once a year (annually) or more often if you have foot problems. If you have foot problems, report any cuts, sores, or bruises to your health care provider immediately. Contact a health care provider if:  You have a medical condition that increases your risk of  infection and you have any cuts, sores, or bruises on your feet.  You have an injury that is not healing.  You have redness on your legs or feet.  You feel burning or tingling in your legs or feet.  You have pain or cramps in your legs and feet.  Your legs or feet are numb.  Your feet always feel cold.  You have pain around a toenail. Get help right away if:  You have a wound, scrape, corn, or callus on your foot and: ? You have pain, swelling, or redness that gets worse. ? You have fluid or blood coming from the wound, scrape, corn, or callus. ? Your wound, scrape, corn, or callus feels warm to the touch. ? You   have pus or a bad smell coming from the wound, scrape, corn, or callus. ? You have a fever. ? You have a red line going up your leg. Summary  Check your feet every day for cuts, sores, red spots, swelling, and blisters.  Moisturize feet and legs daily.  Wear shoes that fit properly and have enough cushioning.  If you have foot problems, report any cuts, sores, or bruises to your health care provider immediately.  Schedule a complete foot exam at least once a year (annually) or more often if you have foot problems. This information is not intended to replace advice given to you by your health care provider. Make sure you discuss any questions you have with your health care provider. Document Released: 10/17/2000 Document Revised: 12/02/2017 Document Reviewed: 11/21/2016 Elsevier Interactive Patient Education  2019 [employer]  Diabetic Neuropathy Diabetic neuropathy refers to nerve damage that is caused by diabetes (diabetes mellitus). Over time, people with diabetes can develop nerve damage throughout the body. There are several types of diabetic neuropathy:  Peripheral neuropathy. This is the most common type of diabetic neuropathy. It causes damage to nerves that carry signals between the spinal cord and other parts of the body (peripheral nerves). This usually  affects nerves in the feet and legs first, and may eventually affect the hands and arms. The damage affects the ability to sense touch or temperature.  Autonomic neuropathy. This type causes damage to nerves that control involuntary functions (autonomic nerves). These nerves carry signals that control: ? Heartbeat. ? Body temperature. ? Blood pressure. ? Urination. ? Digestion. ? Sweating. ? Sexual function. ? Response to changing blood sugar (glucose) levels.  Focal neuropathy. This type of nerve damage affects one area of the body, such as an arm, a leg, or the face. The injury may involve one nerve or a small group of nerves. Focal neuropathy can be painful and unpredictable, and occurs most often in older adults with diabetes. This often develops suddenly, but usually improves over time and does not cause long-term problems.  Proximal neuropathy. This type of nerve damage affects the nerves of the thighs, hips, buttocks, or legs. It causes severe pain, weakness, and muscle death (atrophy), usually in the thigh muscles. It is more common among older men and people who have type 2 diabetes. The length of recovery time may vary. What are the causes? Peripheral, autonomic, and focal neuropathies are caused by diabetes that is not well controlled with treatment. The cause of proximal neuropathy is not known, but it may be caused by inflammation related to uncontrolled blood glucose levels. What are the signs or symptoms? Peripheral neuropathy Peripheral neuropathy develops slowly over time. When the nerves of the feet and legs no longer work, you may experience:  Burning, stabbing, or aching pain in the legs or feet.  Pain or cramping in the legs or feet.  Loss of feeling (numbness) and inability to feel pressure or pain in the feet. This can lead to: ? Thick calluses or sores on areas of constant pressure. ? Ulcers. ? Reduced ability to feel temperature changes.  Foot  deformities.  Muscle weakness.  Loss of balance or coordination. Autonomic neuropathy The symptoms of autonomic neuropathy vary depending on which nerves are affected. Symptoms may include:  Problems with digestion, such as: ? Nausea or vomiting. ? Poor appetite. ? Bloating. ? Diarrhea or constipation. ? Trouble swallowing. ? Losing weight without trying to.  Problems with the heart, blood and lungs, such as: ?   Dizziness, especially when standing up. ? Fainting. ? Shortness of breath. ? Irregular heartbeat.  Bladder problems, such as: ? Trouble starting or stopping urination. ? Leaking urine. ? Trouble emptying the bladder. ? Urinary tract infections (UTIs).  Problems with other body functions, such as: ? Sweat. You may sweat too much or too little. ? Temperature. You might get hot easily. Or, you might feel cold more than usual. ? Sexual function. Men may not be able to get or maintain an erection. Women may have vaginal dryness and difficulty with arousal. Focal neuropathy Symptoms affect only one area of the body. Common symptoms include:  Numbness.  Tingling.  Burning pain.  Prickling feeling.  Very sensitive skin.  Weakness.  Inability to move (paralysis).  Muscle twitching.  Muscles getting smaller (wasting).  Poor coordination.  Double or blurred vision. Proximal neuropathy  Sudden, severe pain in the hip, thigh, or buttocks. Pain may spread from the back into the legs (sciatica).  Pain and numbness in the arms and legs.  Tingling.  Loss of bladder control or bowel control.  Weakness and wasting of thigh muscles.  Difficulty getting up from a seated position.  Abdominal swelling.  Unexplained weight loss. How is this diagnosed? Diagnosis usually involves reviewing your medical history and any symptoms you have. Diagnosis varies depending on the type of neuropathy your health care provider suspects. Peripheral neuropathy Your health  care provider will check areas that are affected by your nervous system (neurologic exam), such as your reflexes, how you move, and what you can feel. You may have other tests, such as:  Blood tests.  Removal and examination of fluid that surrounds the spinal cord (lumbar puncture).  CT scan.  MRI.  A test to check the nerves that control muscles (electromyogram, EMG).  Tests of how quickly messages pass through your nerves (nerve conduction velocity tests).  Removal of a small piece of nerve to be examined under a microscope (biopsy). Autonomic neuropathy You may have tests, such as:  Tests to measure your blood pressure and heart rate. This may include monitoring you while you are safely secured to an exam table that moves you from a lying position to an upright position (table tilt test).  Breathing tests to check your lungs.  Tests to check how food moves through the digestive system (gastric emptying tests).  Blood, sweat, or urine tests.  Ultrasound of your bladder.  Spinal fluid tests. Focal neuropathy This condition may be diagnosed with:  A neurologic exam.  CT scan.  MRI.  EMG.  Nerve conduction velocity tests. Proximal neuropathy There is no test to diagnose this type of neuropathy. You may have tests to rule out other possible causes of this type of neuropathy. Tests may include:  X-rays of your spine and lumbar region.  Lumbar puncture.  MRI. How is this treated? The goal of treatment is to keep nerve damage from getting worse. The most important part of treatment is keeping your blood glucose level and your A1C level within your target range by following your diabetes management plan. Over time, maintaining lower blood glucose levels helps lessen symptoms. In some cases, you may need prescription pain medicine. Follow these instructions at home:  Lifestyle   Do not use any products that contain nicotine or tobacco, such as cigarettes and  e-cigarettes. If you need help quitting, ask your health care provider.  Be physically active every day. Include strength training and balance exercises.  Follow a healthy meal plan.    Work with your health care provider to manage your blood pressure. General instructions  Follow your diabetes management plan as directed. ? Check your blood glucose levels as directed by your health care provider. ? Keep your blood glucose in your target range as directed by your health care provider. ? Have your A1C level checked at least two times a year, or as often as told by your health care provider.  Take over the counter and prescription medicines only as told by your health care provider. This includes insulin and diabetes medicine.  Do not drive or use heavy machinery while taking prescription pain medicines.  Check your skin and feet every day for cuts, bruises, redness, blisters, or sores.  Keep all follow up visits as told by your health care provider. This is important. Contact a health care provider if:  You have burning, stabbing, or aching pain in your legs or feet.  You are unable to feel pressure or pain in your feet.  You develop problems with digestion, such as: ? Nausea. ? Vomiting. ? Bloating. ? Constipation. ? Diarrhea. ? Abdominal pain.  You have difficulty with urination, such as inability: ? To control when you urinate (incontinence). ? To completely empty the bladder (retention).  You have palpitations.  You feel dizzy, weak, or faint when you stand up. Get help right away if:  You cannot urinate.  You have sudden weakness or loss of coordination.  You have trouble speaking.  You have pain or pressure in your chest.  You have an irregular heart beat.  You have sudden inability to move a part of your body. Summary  Diabetic neuropathy refers to nerve damage that is caused by diabetes. It can affect nerves throughout the entire body, causing numbness  and pain in the arms, legs, digestive tract, heart, and other body systems.  Keep your blood glucose level and your blood pressure in your target range, as directed by your health care provider. This can help prevent neuropathy from getting worse.  Check your skin and feet every day for cuts, bruises, redness, blisters, or sores.  Do not use any products that contain nicotine or tobacco, such as cigarettes and e-cigarettes. If you need help quitting, ask your health care provider. This information is not intended to replace advice given to you by your health care provider. Make sure you discuss any questions you have with your health care provider. Document Released: 12/29/2001 Document Revised: 12/02/2017 Document Reviewed: 11/24/2016 Elsevier Interactive Patient Education  2019 [employer]  

## 2019-04-13 ENCOUNTER — Ambulatory Visit (INDEPENDENT_AMBULATORY_CARE_PROVIDER_SITE_OTHER): Payer: Medicare Other | Admitting: Internal Medicine

## 2019-04-13 ENCOUNTER — Other Ambulatory Visit (INDEPENDENT_AMBULATORY_CARE_PROVIDER_SITE_OTHER): Payer: Medicare Other

## 2019-04-13 ENCOUNTER — Ambulatory Visit (INDEPENDENT_AMBULATORY_CARE_PROVIDER_SITE_OTHER): Payer: Medicare Other | Admitting: *Deleted

## 2019-04-13 ENCOUNTER — Encounter: Payer: Self-pay | Admitting: Internal Medicine

## 2019-04-13 VITALS — BP 146/86 | HR 83 | Temp 98.1°F | Resp 16 | Ht 63.0 in | Wt 157.0 lb

## 2019-04-13 DIAGNOSIS — I4821 Permanent atrial fibrillation: Secondary | ICD-10-CM | POA: Diagnosis not present

## 2019-04-13 DIAGNOSIS — I495 Sick sinus syndrome: Secondary | ICD-10-CM

## 2019-04-13 DIAGNOSIS — I5042 Chronic combined systolic (congestive) and diastolic (congestive) heart failure: Secondary | ICD-10-CM | POA: Diagnosis not present

## 2019-04-13 DIAGNOSIS — E7800 Pure hypercholesterolemia, unspecified: Secondary | ICD-10-CM

## 2019-04-13 DIAGNOSIS — E1140 Type 2 diabetes mellitus with diabetic neuropathy, unspecified: Secondary | ICD-10-CM | POA: Diagnosis not present

## 2019-04-13 DIAGNOSIS — N183 Chronic kidney disease, stage 3 unspecified: Secondary | ICD-10-CM

## 2019-04-13 DIAGNOSIS — I10 Essential (primary) hypertension: Secondary | ICD-10-CM

## 2019-04-13 DIAGNOSIS — R600 Localized edema: Secondary | ICD-10-CM

## 2019-04-13 DIAGNOSIS — E0842 Diabetes mellitus due to underlying condition with diabetic polyneuropathy: Secondary | ICD-10-CM

## 2019-04-13 LAB — CUP PACEART REMOTE DEVICE CHECK
Battery Impedance: 1166 Ohm
Battery Remaining Longevity: 54 mo
Battery Voltage: 2.78 V
Brady Statistic AP VP Percent: 6 %
Brady Statistic AP VS Percent: 0 %
Brady Statistic AS VP Percent: 6 %
Brady Statistic AS VS Percent: 88 %
Date Time Interrogation Session: 20200609211201
Implantable Lead Implant Date: 20110912
Implantable Lead Implant Date: 20110912
Implantable Lead Location: 753859
Implantable Lead Location: 753860
Implantable Lead Model: 5076
Implantable Lead Model: 5092
Implantable Pulse Generator Implant Date: 20110912
Lead Channel Impedance Value: 487 Ohm
Lead Channel Impedance Value: 695 Ohm
Lead Channel Pacing Threshold Amplitude: 0.625 V
Lead Channel Pacing Threshold Amplitude: 0.75 V
Lead Channel Pacing Threshold Pulse Width: 0.4 ms
Lead Channel Pacing Threshold Pulse Width: 0.4 ms
Lead Channel Setting Pacing Amplitude: 2 V
Lead Channel Setting Pacing Amplitude: 2.5 V
Lead Channel Setting Pacing Pulse Width: 0.4 ms
Lead Channel Setting Sensing Sensitivity: 2 mV

## 2019-04-13 LAB — COMPREHENSIVE METABOLIC PANEL
ALT: 11 U/L (ref 0–35)
AST: 14 U/L (ref 0–37)
Albumin: 4.1 g/dL (ref 3.5–5.2)
Alkaline Phosphatase: 102 U/L (ref 39–117)
BUN: 25 mg/dL — ABNORMAL HIGH (ref 6–23)
CO2: 29 mEq/L (ref 19–32)
Calcium: 10.1 mg/dL (ref 8.4–10.5)
Chloride: 107 mEq/L (ref 96–112)
Creatinine, Ser: 1.16 mg/dL (ref 0.40–1.20)
GFR: 43.88 mL/min — ABNORMAL LOW (ref >60.00)
Glucose, Bld: 104 mg/dL — ABNORMAL HIGH (ref 70–99)
Potassium: 4.3 mEq/L (ref 3.5–5.1)
Sodium: 142 mEq/L (ref 135–145)
Total Bilirubin: 0.9 mg/dL (ref 0.2–1.2)
Total Protein: 7.2 g/dL (ref 6.0–8.3)

## 2019-04-13 LAB — CBC WITH DIFFERENTIAL/PLATELET
Basophils Absolute: 0.1 10*3/uL (ref 0.0–0.1)
Basophils Relative: 0.7 % (ref 0.0–3.0)
Eosinophils Absolute: 0.1 10*3/uL (ref 0.0–0.7)
Eosinophils Relative: 1 % (ref 0.0–5.0)
HCT: 40.7 % (ref 36.0–46.0)
Hemoglobin: 13.9 g/dL (ref 12.0–15.0)
Lymphocytes Relative: 26.2 % (ref 12.0–46.0)
Lymphs Abs: 2.1 10*3/uL (ref 0.7–4.0)
MCHC: 34.1 g/dL (ref 30.0–36.0)
MCV: 90 fl (ref 78.0–100.0)
Monocytes Absolute: 0.6 10*3/uL (ref 0.1–1.0)
Monocytes Relative: 7.3 % (ref 3.0–12.0)
Neutro Abs: 5.1 10*3/uL (ref 1.4–7.7)
Neutrophils Relative %: 64.8 % (ref 43.0–77.0)
Platelets: 205 10*3/uL (ref 150.0–400.0)
RBC: 4.52 Mil/uL (ref 3.87–5.11)
RDW: 15.3 % (ref 11.5–15.5)
WBC: 7.9 10*3/uL (ref 4.0–10.5)

## 2019-04-13 LAB — HEMOGLOBIN A1C: Hgb A1c MFr Bld: 6.9 % — ABNORMAL HIGH (ref 4.6–6.5)

## 2019-04-13 NOTE — Assessment & Plan Note (Signed)
Blood pressure is stable  Overall controlled for age Continue current medications at current doses CMP

## 2019-04-13 NOTE — Assessment & Plan Note (Signed)
Continue statin Lipids have been well controlled-will not check today

## 2019-04-13 NOTE — Assessment & Plan Note (Signed)
Stable Continue current dose of furosemide

## 2019-04-13 NOTE — Assessment & Plan Note (Signed)
Stable Continue gabapentin 

## 2019-04-13 NOTE — Patient Instructions (Signed)
  Tests ordered today. Your results will be released to MyChart (or called to you) after review, usually within 72hours after test completion. If any changes need to be made, you will be notified at that same time.  Medications reviewed and updated.  Changes include :   none      Please followup in 6 months   

## 2019-04-13 NOTE — Assessment & Plan Note (Signed)
Asymptomatic, no palpitations On Eliquis CBC

## 2019-04-13 NOTE — Assessment & Plan Note (Signed)
Sugars controlled at home Diet controlled Encourage more activity Check A1c

## 2019-04-13 NOTE — Assessment & Plan Note (Signed)
Euvolemic on exam Continue current dose of furosemide CMP

## 2019-04-18 ENCOUNTER — Other Ambulatory Visit: Payer: Self-pay | Admitting: Cardiology

## 2019-04-18 ENCOUNTER — Other Ambulatory Visit: Payer: Self-pay | Admitting: Internal Medicine

## 2019-04-18 DIAGNOSIS — I5042 Chronic combined systolic (congestive) and diastolic (congestive) heart failure: Secondary | ICD-10-CM

## 2019-04-18 DIAGNOSIS — I5033 Acute on chronic diastolic (congestive) heart failure: Secondary | ICD-10-CM

## 2019-04-18 NOTE — Telephone Encounter (Signed)
Prescription refill request for Eliquis received.  Last office visit: Jessica [name] (09-20-2018) Scr: 1.16 (04-13-2019) Age: 85  Weight: 71.2kg (04-13-2019)  Prescription refill request sent.

## 2019-04-22 ENCOUNTER — Encounter: Payer: Self-pay | Admitting: Cardiology

## 2019-04-22 NOTE — Progress Notes (Signed)
Remote pacemaker transmission.   

## 2019-04-24 NOTE — Progress Notes (Signed)
Subjective: Jessica [name] presents today with painful, thick toenails 1-5 b/l that she cannot cut and which interfere with daily activities.  Pain is aggravated when wearing enclosed shoe gear.  She notes painful callus on the plantar aspect of her left foot which is tender when weightbearing.  Binnie Rail, MD is her PCP and last visit was 04/13/2019.   Current Outpatient Medications:  .  b complex vitamins tablet, Take 1 tablet by mouth daily., Disp: , Rfl:  .  blood glucose meter kit and supplies KIT, Dispense based on patient and insurance preference. Test sugars once daily and prn as directed. (FOR ICD-9 250.00, 250.01)., Disp: 1 each, Rfl: 0 .  carvedilol (COREG) 12.5 MG tablet, TAKE 1 TABLET BY MOUTH TWICE DAILY, Disp: 180 tablet, Rfl: 2 .  docusate sodium (COLACE) 100 MG capsule, Take 100 mg by mouth at bedtime., Disp: , Rfl:  .  ECHINACEA PO, Take 750 mg by mouth daily., Disp: , Rfl:  .  erythromycin ophthalmic ointment, APPLY TO EYELIDS ONCE DAILY AT BEDTIME, Disp: , Rfl:  .  fish oil-omega-3 fatty acids 1000 MG capsule, Take 1 g by mouth every morning. , Disp: , Rfl:  .  furosemide (LASIX) 20 MG tablet, Take 2 tablets by mouth every morning and one tablet every afternoon, Disp: 270 tablet, Rfl: 2 .  gabapentin (NEURONTIN) 300 MG capsule, TAKE 1 CAPSULE BY MOUTH THREE TIMES DAILY, Disp: 270 capsule, Rfl: 0 .  lisinopril (PRINIVIL,ZESTRIL) 5 MG tablet, Take 1 tablet by mouth once daily, Disp: 90 tablet, Rfl: 3 .  Misc. Devices (ROLLATOR ULTRA-LIGHT) MISC, With seat.  Diabetic neuropathy, knee arthritis, Disp: 1 each, Rfl: 0 .  nitroGLYCERIN (NITROSTAT) 0.4 MG SL tablet, Place 1 tablet (0.4 mg total) under the tongue every 5 (five) minutes as needed for chest pain. x3 doses as needed for chest pain, Disp: 25 tablet, Rfl: 0 .  potassium chloride SA (K-DUR,KLOR-CON) 20 MEQ tablet, TAKE 1 TABLET BY MOUTH ONCE DAILY, Disp: 90 tablet, Rfl: 1 .  rosuvastatin (CRESTOR) 5 MG tablet, TAKE 1  TABLET BY MOUTH AT BEDTIME, Disp: 90 tablet, Rfl: 2 .  ELIQUIS 5 MG TABS tablet, Take 1 tablet by mouth twice daily, Disp: 180 tablet, Rfl: 1 .  gabapentin (NEURONTIN) 100 MG capsule, TAKE 1 CAPSULE BY MOUTH AT NIGHT (TAKE IN ADDITION TO 300 MG CAPSULE AT NIGHT), Disp: 90 capsule, Rfl: 1  Allergies  Allergen Reactions  . Lyrica [Pregabalin] Swelling    Caused weight gain  . Amlodipine Swelling  . Sulfonamide Derivatives Rash    Objective: There were no vitals filed for this visit.  Vascular Examination: Capillary refill time <3 seconds  x 10 digits.  Dorsalis pedis and Posterior tibial pulses palpable b/l.  Digital hair absent x 10 digits.  Skin temperature gradient WNL b/l.  Dermatological Examination: Skin with normal turgor, texture and tone b/l.  Toenails 1-5 b/l discolored, thick, dystrophic with subungual debris and pain with palpation to nailbeds due to thickness of nails.  Porokeratotic lesions submet head 4 left foot with tenderness to palpation. No erythema, no edema, no drainage, no flocculence.  Musculoskeletal: Muscle strength 5/5 to all LE muscle groups  No gross bony deformities b/l.  No pain, crepitus or joint limitation noted with ROM.   Neurological: Sensation decreased with 10 gram monofilament.  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 Continue diabetic foot care principles. 2. Toenails 1-5  b/l were debrided in length and girth without iatrogenic bleeding. 3. Porokeratosis submet head 4 left foot pared and enucleated with sterile scalpel blade without incident. 4. Patient to continue soft, supportive shoe gear daily. 5. Patient to report any pedal injuries to medical professional immediately. 6. Follow up 3 months.  7. Patient/POA to call should there be a concern in the interim.

## 2019-04-25 ENCOUNTER — Other Ambulatory Visit: Payer: Self-pay | Admitting: Internal Medicine

## 2019-04-25 DIAGNOSIS — E1140 Type 2 diabetes mellitus with diabetic neuropathy, unspecified: Secondary | ICD-10-CM

## 2019-04-26 ENCOUNTER — Other Ambulatory Visit: Payer: Self-pay | Admitting: Internal Medicine

## 2019-05-27 ENCOUNTER — Other Ambulatory Visit: Payer: Self-pay | Admitting: Cardiology

## 2019-05-27 ENCOUNTER — Other Ambulatory Visit: Payer: Self-pay | Admitting: Internal Medicine

## 2019-06-21 ENCOUNTER — Ambulatory Visit (INDEPENDENT_AMBULATORY_CARE_PROVIDER_SITE_OTHER): Payer: Medicare Other | Admitting: Cardiology

## 2019-06-21 ENCOUNTER — Other Ambulatory Visit: Payer: Self-pay

## 2019-06-21 ENCOUNTER — Encounter: Payer: Self-pay | Admitting: Cardiology

## 2019-06-21 VITALS — BP 138/70 | HR 65 | Ht 63.0 in | Wt 161.8 lb

## 2019-06-21 DIAGNOSIS — Z7901 Long term (current) use of anticoagulants: Secondary | ICD-10-CM | POA: Diagnosis not present

## 2019-06-21 DIAGNOSIS — I5043 Acute on chronic combined systolic (congestive) and diastolic (congestive) heart failure: Secondary | ICD-10-CM | POA: Diagnosis not present

## 2019-06-21 DIAGNOSIS — I4821 Permanent atrial fibrillation: Secondary | ICD-10-CM | POA: Diagnosis not present

## 2019-06-21 DIAGNOSIS — I2510 Atherosclerotic heart disease of native coronary artery without angina pectoris: Secondary | ICD-10-CM

## 2019-06-21 DIAGNOSIS — I10 Essential (primary) hypertension: Secondary | ICD-10-CM

## 2019-06-21 MED ORDER — LISINOPRIL 5 MG PO TABS: 7.5000 mg | ORAL_TABLET | Freq: Every day | ORAL | 3 refills | Status: DC

## 2019-06-21 NOTE — Patient Instructions (Signed)
Medication Instructions:  INCREASE LISINOPRIL to 7.5 mg DAILY (1.5 TABLETS) If you need a refill on your cardiac medications before your next appointment, please call your pharmacy.   Lab work: NONE If you have labs (blood work) drawn today and your tests are completely normal, you will receive your results only by: Marland Kitchen MyChart Message (if you have MyChart) OR . A paper copy in the mail If you have any lab test that is abnormal or we need to change your treatment, we will call you to review the results.  Testing/Procedures: NONE  Follow-Up: At [facility], you and your health needs are our priority.  As part of our continuing mission to provide you with exceptional heart care, we have created designated Provider Care Teams.  These Care Teams include your primary Cardiologist (physician) and Advanced Practice Providers (APPs -  Physician Assistants and Nurse Practitioners) who all work together to provide you with the care you need, when you need it. You will need a follow up appointment in 6 months.  Please call our office 2 months in advance to schedule this appointment.  You may see Ena Dawley, MD or one of the following Advanced Practice Providers on your designated Care Team:   [place], PA-C [name], PA-C . Ermalinda Barrios, PA-C  Any Other Special Instructions Will Be Listed Below (If Applicable). WEIGHT:  Weigh daily.  If you gain more than 3 lbs in 1 day or 5 lbs in 1 week, call our office [phone]   [place] stands for "Dietary Approaches to Stop Hypertension." The DASH eating plan is a healthy eating plan that has been shown to reduce high blood pressure (hypertension). It may also reduce your risk for type 2 diabetes, heart disease, and stroke. The DASH eating plan may also help with weight loss. What are tips for following this plan?  General guidelines  Avoid eating more than 2,300 mg (milligrams) of salt (sodium) a day. If you have  hypertension, you may need to reduce your sodium intake to 1,500 mg a day.  Limit alcohol intake to no more than 1 drink a day for nonpregnant women and 2 drinks a day for men. One drink equals 12 oz of beer, 5 oz of wine, or 1 oz of hard liquor.  Work with your health care provider to maintain a healthy body weight or to lose weight. Ask what an ideal weight is for you.  Get at least 30 minutes of exercise that causes your heart to beat faster (aerobic exercise) most days of the week. Activities may include walking, swimming, or biking.  Work with your health care provider or diet and nutrition specialist (dietitian) to adjust your eating plan to your individual calorie needs. Reading food labels   Check food labels for the amount of sodium per serving. Choose foods with less than 5 percent of the Daily Value of sodium. Generally, foods with less than 300 mg of sodium per serving fit into this eating plan.  To find whole grains, look for the word "whole" as the first word in the ingredient list. Shopping  Buy products labeled as "low-sodium" or "no salt added."  Buy fresh foods. Avoid canned foods and premade or frozen meals. Cooking  Avoid adding salt when cooking. Use salt-free seasonings or herbs instead of table salt or sea salt. Check with your health care provider or pharmacist before using salt substitutes.  Do not fry foods. Cook foods using healthy methods such as baking,  boiling, grilling, and broiling instead.  Cook with heart-healthy oils, such as olive, canola, soybean, or sunflower oil. Meal planning  Eat a balanced diet that includes: ? 5 or more servings of fruits and vegetables each day. At each meal, try to fill half of your plate with fruits and vegetables. ? Up to 6-8 servings of whole grains each day. ? Less than 6 oz of lean meat, poultry, or fish each day. A 3-oz serving of meat is about the same size as a deck of cards. One egg equals 1 oz. ? 2 servings of  low-fat dairy each day. ? A serving of nuts, seeds, or beans 5 times each week. ? Heart-healthy fats. Healthy fats called Omega-3 fatty acids are found in foods such as flaxseeds and coldwater fish, like sardines, salmon, and mackerel.  Limit how much you eat of the following: ? Canned or prepackaged foods. ? Food that is high in trans fat, such as fried foods. ? Food that is high in saturated fat, such as fatty meat. ? Sweets, desserts, sugary drinks, and other foods with added sugar. ? Full-fat dairy products.  Do not salt foods before eating.  Try to eat at least 2 vegetarian meals each week.  Eat more home-cooked food and less restaurant, buffet, and fast food.  When eating at a restaurant, ask that your food be prepared with less salt or no salt, if possible. What foods are recommended? The items listed may not be a complete list. Talk with your dietitian about what dietary choices are best for you. Grains Whole-grain or whole-wheat bread. Whole-grain or whole-wheat pasta. Brown rice. [place]ena Morrow. Bulgur. Whole-grain and low-sodium cereals. Pita bread. Low-fat, low-sodium crackers. Whole-wheat flour tortillas. Vegetables Fresh or frozen vegetables (raw, steamed, roasted, or grilled). Low-sodium or reduced-sodium tomato and vegetable juice. Low-sodium or reduced-sodium tomato sauce and tomato paste. Low-sodium or reduced-sodium canned vegetables. Fruits All fresh, dried, or frozen fruit. Canned fruit in natural juice (without added sugar). Meat and other protein foods Skinless chicken or [place]. Ground chicken or [place]. Pork with fat trimmed off. Fish and seafood. Egg whites. Dried beans, peas, or lentils. Unsalted nuts, nut butters, and seeds. Unsalted canned beans. Lean cuts of beef with fat trimmed off. Low-sodium, lean deli meat. Dairy Low-fat (1%) or fat-free (skim) milk. Fat-free, low-fat, or reduced-fat cheeses. Nonfat, low-sodium ricotta or cottage cheese. Low-fat or  nonfat yogurt. Low-fat, low-sodium cheese. Fats and oils Soft margarine without trans fats. Vegetable oil. Low-fat, reduced-fat, or light mayonnaise and salad dressings (reduced-sodium). Canola, safflower, olive, soybean, and sunflower oils. Avocado. Seasoning and other foods Herbs. Spices. Seasoning mixes without salt. Unsalted popcorn and pretzels. Fat-free sweets. What foods are not recommended? The items listed may not be a complete list. Talk with your dietitian about what dietary choices are best for you. Grains Baked goods made with fat, such as croissants, muffins, or some breads. Dry pasta or rice meal packs. Vegetables Creamed or fried vegetables. Vegetables in a cheese sauce. Regular canned vegetables (not low-sodium or reduced-sodium). Regular canned tomato sauce and paste (not low-sodium or reduced-sodium). Regular tomato and vegetable juice (not low-sodium or reduced-sodium). Angie Fava. Olives. Fruits Canned fruit in a light or heavy syrup. Fried fruit. Fruit in cream or butter sauce. Meat and other protein foods Fatty cuts of meat. Ribs. Fried meat. Berniece Salines. Sausage. Bologna and other processed lunch meats. Salami. Fatback. Hotdogs. Bratwurst. Salted nuts and seeds. Canned beans with added salt. Canned or smoked fish. Whole eggs or egg yolks. Chicken  or [place] with skin. Dairy Whole or 2% milk, cream, and half-and-half. Whole or full-fat cream cheese. Whole-fat or sweetened yogurt. Full-fat cheese. Nondairy creamers. Whipped toppings. Processed cheese and cheese spreads. Fats and oils Butter. Stick margarine. Lard. Shortening. Ghee. Bacon fat. Tropical oils, such as coconut, palm kernel, or palm oil. Seasoning and other foods Salted popcorn and pretzels. Onion salt, garlic salt, seasoned salt, table salt, and sea salt. Worcestershire sauce. Tartar sauce. Barbecue sauce. Teriyaki sauce. Soy sauce, including reduced-sodium. Steak sauce. Canned and packaged gravies. Fish sauce. Oyster  sauce. Cocktail sauce. Horseradish that you find on the shelf. Ketchup. Mustard. Meat flavorings and tenderizers. Bouillon cubes. Hot sauce and Tabasco sauce. Premade or packaged marinades. Premade or packaged taco seasonings. Relishes. Regular salad dressings. Where to find more information:  National Heart, Lung, and [place]: https://wilson-eaton.com/  American Heart Association: www.heart.org Summary  The DASH eating plan is a healthy eating plan that has been shown to reduce high blood pressure (hypertension). It may also reduce your risk for type 2 diabetes, heart disease, and stroke.  With the DASH eating plan, you should limit salt (sodium) intake to 2,300 mg a day. If you have hypertension, you may need to reduce your sodium intake to 1,500 mg a day.  When on the DASH eating plan, aim to eat more fresh fruits and vegetables, whole grains, lean proteins, low-fat dairy, and heart-healthy fats.  Work with your health care provider or diet and nutrition specialist (dietitian) to adjust your eating plan to your individual calorie needs. This information is not intended to replace advice given to you by your health care provider. Make sure you discuss any questions you have with your health care provider. Document Released: 10/09/2011 Document Revised: 10/02/2017 Document Reviewed: 10/13/2016 Elsevier Patient Education  2020 [employer].

## 2019-06-21 NOTE — Progress Notes (Signed)
06/21/2019 Jessica [name]   09-Feb-1927  [id number]  Primary Physician Quay Burow, Claudina Lick, MD Primary Cardiologist: Ena Dawley, MD  Electrophysiologist: None   Reason for Visit/CC: 6 month f/u for CAD and Chronic Combined Systolic and Diastolic HF and  Chronic Afib   HPI:  Jessica [name] is a 85 y.o. female with history of CAD (DESx2 to mid and distal RCA in 2010), chronic combined CHF, hypertensive heart disease, DM with peripheral neuropathy, HTN, HLD, SSS s/p Medtronic pacemaker 2011, permanent atrial fib (discovered on device interrogation 10/2014), CKD stage III, and moderate pulm HTN. Her last echocardiogram was in December 2018 which showed new cardiomyopathy with a EF of 30 to 35%.  Per patient's wishes, she did not want to undergo aggressive work-up.  She opted to be treated medically.  No repeat cardiac catheterization was performed at time of diagnosis of new systolic heart failure.  She presents to clinic today for her 22-monthfollow-up.  Last office visit was in March.  She is here with her daughter today.  She reports that she is doing well.  No cardiac complaints.  She denies chest pain, palpitations, dyspnea, lower extremity edema, syncope/near syncope.  Fully compliant with medications.  No abnormal bleeding with Eliquis.  She denies falls.  She has been checking her weight daily at home which is remained stable.  No significant lower extremity edema.  Her blood pressure however has been mildly elevated at home in the 1607Pand 1710systolic.  Blood pressure today is 138/70.  Heart rate 66 bpm.   Current Meds  Medication Sig  . b complex vitamins tablet Take 1 tablet by mouth daily.  . blood glucose meter kit and supplies KIT Dispense based on patient and insurance preference. Test sugars once daily and prn as directed. (FOR ICD-9 250.00, 250.01).  . carvedilol (COREG) 12.5 MG tablet TAKE 1 TABLET BY MOUTH TWICE DAILY  . docusate sodium (COLACE) 100 MG capsule Take 100 mg by  mouth at bedtime.  .Marland KitchenECHINACEA PO Take 750 mg by mouth daily.  .Marland KitchenELIQUIS 5 MG TABS tablet Take 1 tablet by mouth twice daily  . fish oil-omega-3 fatty acids 1000 MG capsule Take 1 g by mouth every morning.   . furosemide (LASIX) 20 MG tablet TAKE 2 TABLETS BY MOUTH EVERY MORNING AND 1 TABLET EVERY AFTERNOON  . gabapentin (NEURONTIN) 100 MG capsule TAKE 1 CAPSULE BY MOUTH AT NIGHT (TAKE IN ADDITION TO 300 MG CAPSULE AT NIGHT)  . gabapentin (NEURONTIN) 300 MG capsule TAKE 1 CAPSULE BY MOUTH THREE TIMES DAILY  . Misc. Devices (ROLLATOR ULTRA-LIGHT) MISC With seat.  Diabetic neuropathy, knee arthritis  . nitroGLYCERIN (NITROSTAT) 0.4 MG SL tablet Place 1 tablet (0.4 mg total) under the tongue every 5 (five) minutes as needed for chest pain. x3 doses as needed for chest pain  . potassium chloride SA (K-DUR) 20 MEQ tablet Take 1 tablet by mouth once daily  . rosuvastatin (CRESTOR) 5 MG tablet TAKE 1 TABLET BY MOUTH AT BEDTIME  . [DISCONTINUED] lisinopril (PRINIVIL,ZESTRIL) 5 MG tablet Take 1 tablet by mouth once daily   Allergies  Allergen Reactions  . Lyrica [Pregabalin] Swelling    Caused weight gain  . Amlodipine Swelling  . Sulfonamide Derivatives Rash   Past Medical History:  Diagnosis Date  . Breast cancer ([place]   . CAD (coronary artery disease)    a. s/p DESx2 to mid and distal RCA in 2010.  .Marland KitchenCholelithiasis 09/12/2013   Lap  Chole on 09/14/13   . Chronic combined systolic and diastolic CHF (congestive heart failure) ([place])    a. Echo 06/09/15: EF 55-60% -> 10/2017 EF 30-35% (pt and daughter did not wish to proceed with ischemic assessment - conservative rx).  . CKD (chronic kidney disease), stage III ([place])   . Diabetes mellitus    NON INSULIN DEPENDENT  . DJD (degenerative joint disease)   . GERD (gastroesophageal reflux disease)   . Hemorrhoids   . History of diabetic neuropathy   . Hypercholesterolemia   . Hypertension   . Hypertensive heart disease   . Persistent atrial  fibrillation    a. discovered on PPM interrogation 12/15, CHAD2VASC score is 6. b. decompensation with dCHF in 06/2015 possibly due to AF, s/p DCCV.Marland Kitchen c. 2018-2019 increase in burden.  . SSS (sick sinus syndrome) ([place])    a. s/p Medtronic PPM by JA for SSS and syncope 9/11.   Family History  Problem Relation Age of Onset  . Cancer Mother   . Cancer Father    Past Surgical History:  Procedure Laterality Date  . CARDIAC CATHETERIZATION  07/05/2009   EF 55-60%  . CARDIOVERSION N/A 06/11/2015   Procedure: CARDIOVERSION;  Surgeon: [name], MD;  Location[facility] ENDOSCOPY;  Service: Cardiovascular;  Laterality: N/A;  . CHOLECYSTECTOMY N/A 09/14/2013   Procedure: LAPAROSCOPIC CHOLECYSTECTOMY WITH INTRAOPERATIVE CHOLANGIOGRAM;  Surgeon: [name], MD;  Location: [facility];  Service: General;  Laterality: N/A;  . COLONOSCOPY    . ERCP N/A 09/13/2013   Procedure: ENDOSCOPIC RETROGRADE CHOLANGIOPANCREATOGRAPHY (ERCP);  Surgeon: [name], MD;  Location[facility] ENDOSCOPY;  Service: Endoscopy;  Laterality: N/A;  . INSERT / REPLACE / REMOVE PACEMAKER  9/11   SSS and syncope, implanted by JA (MDT)  . MASTECTOMY  1992   BILATERAL WITH RECONSTRUCTION   Social History   Socioeconomic History  . Marital status: Widowed    Spouse name: Not on file  . Number of children: Not on file  . Years of education: Not on file  . Highest education level: Not on file  Occupational History  . Not on file  Social Needs  . Financial resource strain: Not on file  . Food insecurity    Worry: Not on file    Inability: Not on file  . Transportation needs    Medical: Not on file    Non-medical: Not on file  Tobacco Use  . Smoking status: Never Smoker  . Smokeless tobacco: Never Used  Substance and Sexual Activity  . Alcohol use: No    Alcohol/week: 0.0 standard drinks  . Drug use: No  . Sexual activity: Not on file  Lifestyle  . Physical activity    Days per week: Not on file    Minutes per  session: Not on file  . Stress: Not on file  Relationships  . Social Herbalist on phone: Not on file    Gets together: Not on file    Attends religious service: Not on file    Active member of club or organization: Not on file    Attends meetings of clubs or organizations: Not on file    Relationship status: Not on file  . Intimate partner violence    Fear of current or ex partner: Not on file    Emotionally abused: Not on file    Physically abused: Not on file    Forced sexual activity: Not on file  Other Topics Concern  .  Not on file  Social History Narrative  . Not on file     Lipid Panel     Component Value Date/Time   CHOL 118 10/13/2018 1558   TRIG 123.0 10/13/2018 1558   HDL 42.80 10/13/2018 1558   CHOLHDL 3 10/13/2018 1558   VLDL 24.6 10/13/2018 1558   LDLCALC 51 10/13/2018 1558   LDLDIRECT 152.0 10/10/2013 1619    Review of Systems: General: negative for chills, fever, night sweats or weight changes.  Cardiovascular: negative for chest pain, dyspnea on exertion, edema, orthopnea, palpitations, paroxysmal nocturnal dyspnea or shortness of breath Dermatological: negative for rash Respiratory: negative for cough or wheezing Urologic: negative for hematuria Abdominal: negative for nausea, vomiting, diarrhea, bright red blood per rectum, melena, or hematemesis Neurologic: negative for visual changes, syncope, or dizziness All other systems reviewed and are otherwise negative except as noted above.   Physical Exam:  Blood pressure 138/70, pulse 65, height 5' 3" (1.6 m), weight 161 lb 12.8 oz (73.4 kg), SpO2 97 %.  General appearance: alert, cooperative and no distress Neck: no carotid bruit and no JVD Lungs: clear to auscultation bilaterally Heart: irregularly irregular rhythm and Regular rate Extremities: extremities normal, atraumatic, no cyanosis or edema Pulses: 2+ and symmetric Skin: Skin color, texture, turgor normal. No rashes or lesions  Neurologic: Grossly normal  EKG not performed-- personally reviewed   ASSESSMENT AND PLAN:   1.  CAD: Stable without angina.  Continue medical therapy.  She is not on aspirin due to chronic Eliquis.  Continue statin and beta-blocker.  2.  Chronic combined systolic and diastolic heart failure: Euvolemic from a volume standpoint.  She denies any exertional dyspnea.  She is on Lasix daily.  She has been monitoring her weight daily which has remained stable.  Patient given instructions to continue daily weights at home.  She was given instruction to notify our office if greater than 3 pound weight gain in 24 hours or greater than 5 pound weight gain in the course of 1 week.  3.  Permanent atrial fibrillation: Asymptomatic.  Rate controlled with beta-blocker.  Heart rate in the 60s.  On Eliquis for anticoagulation.  4.  Chronic anticoagulation: On Eliquis for anticoagulation for permanent atrial fibrillation.  She reports full compliance.  She denies any abnormal bleeding.  No falls.  I reviewed labs.  She had recent laboratory work done at PCP office in June which showed stable hemoglobin at 13.  5.  Sick sinus syndrome status post permanent pacemaker: Followed by EP  6.  Hypertension: Mildly elevated.  138/70 today.  Blood pressures at home have been elevated up into the 051 systolic.  Heart rate 65.  She had recent laboratory work at PCP office in June which showed stable creatinine at 1.1.  We will further increase her lisinopril to 7.5 mg daily..  She and her daughter will continue to monitor her blood pressure closely at home.  7.  Hyperlipidemia: On statin therapy with Crestor.  8.  Type 2 diabetes: Well-controlled.  Recent hemoglobin A1c was at goal at 6.9.   Follow-Up with Dr. [name] in 6 months.  Brittainy Ladoris Gene, MHS [facility] 06/21/2019 4:34 PM

## 2019-07-13 ENCOUNTER — Ambulatory Visit (INDEPENDENT_AMBULATORY_CARE_PROVIDER_SITE_OTHER): Payer: Medicare Other | Admitting: *Deleted

## 2019-07-13 DIAGNOSIS — I495 Sick sinus syndrome: Secondary | ICD-10-CM

## 2019-07-13 DIAGNOSIS — I4821 Permanent atrial fibrillation: Secondary | ICD-10-CM

## 2019-07-13 LAB — CUP PACEART REMOTE DEVICE CHECK
Battery Impedance: 1218 Ohm
Battery Remaining Longevity: 52 mo
Battery Voltage: 2.78 V
Brady Statistic AP VP Percent: 6 %
Brady Statistic AP VS Percent: 0 %
Brady Statistic AS VP Percent: 7 %
Brady Statistic AS VS Percent: 87 %
Date Time Interrogation Session: 20200909163935
Implantable Lead Implant Date: 20110912
Implantable Lead Implant Date: 20110912
Implantable Lead Location: 753859
Implantable Lead Location: 753860
Implantable Lead Model: 5076
Implantable Lead Model: 5092
Implantable Pulse Generator Implant Date: 20110912
Lead Channel Impedance Value: 494 Ohm
Lead Channel Impedance Value: 693 Ohm
Lead Channel Pacing Threshold Amplitude: 0.625 V
Lead Channel Pacing Threshold Amplitude: 0.75 V
Lead Channel Pacing Threshold Pulse Width: 0.4 ms
Lead Channel Pacing Threshold Pulse Width: 0.4 ms
Lead Channel Setting Pacing Amplitude: 2 V
Lead Channel Setting Pacing Amplitude: 2.5 V
Lead Channel Setting Pacing Pulse Width: 0.4 ms
Lead Channel Setting Sensing Sensitivity: 2 mV

## 2019-07-19 ENCOUNTER — Ambulatory Visit (INDEPENDENT_AMBULATORY_CARE_PROVIDER_SITE_OTHER): Payer: Medicare Other | Admitting: Podiatry

## 2019-07-19 ENCOUNTER — Other Ambulatory Visit: Payer: Self-pay

## 2019-07-19 ENCOUNTER — Encounter: Payer: Self-pay | Admitting: Podiatry

## 2019-07-19 DIAGNOSIS — E1142 Type 2 diabetes mellitus with diabetic polyneuropathy: Secondary | ICD-10-CM

## 2019-07-19 DIAGNOSIS — B351 Tinea unguium: Secondary | ICD-10-CM

## 2019-07-19 DIAGNOSIS — Q828 Other specified congenital malformations of skin: Secondary | ICD-10-CM | POA: Diagnosis not present

## 2019-07-19 DIAGNOSIS — M79676 Pain in unspecified toe(s): Secondary | ICD-10-CM | POA: Diagnosis not present

## 2019-07-19 LAB — HM DIABETES FOOT EXAM

## 2019-07-19 NOTE — Patient Instructions (Signed)
Diabetes Mellitus and Foot Care Foot care is an important part of your health, especially when you have diabetes. Diabetes may cause you to have problems because of poor blood flow (circulation) to your feet and legs, which can cause your skin to:  Become thinner and drier.  Break more easily.  Heal more slowly.  Peel and crack. You may also have nerve damage (neuropathy) in your legs and feet, causing decreased feeling in them. This means that you may not notice minor injuries to your feet that could lead to more serious problems. Noticing and addressing any potential problems early is the best way to prevent future foot problems. How to care for your feet Foot hygiene  Wash your feet daily with warm water and mild soap. Do not use hot water. Then, pat your feet and the areas between your toes until they are completely dry. Do not soak your feet as this can dry your skin.  Trim your toenails straight across. Do not dig under them or around the cuticle. File the edges of your nails with an emery board or nail file.  Apply a moisturizing lotion or petroleum jelly to the skin on your feet and to dry, brittle toenails. Use lotion that does not contain alcohol and is unscented. Do not apply lotion between your toes. Shoes and socks  Wear clean socks or stockings every day. Make sure they are not too tight. Do not wear knee-high stockings since they may decrease blood flow to your legs.  Wear shoes that fit properly and have enough cushioning. Always look in your shoes before you put them on to be sure there are no objects inside.  To break in new shoes, wear them for just a few hours a day. This prevents injuries on your feet. Wounds, scrapes, corns, and calluses  Check your feet daily for blisters, cuts, bruises, sores, and redness. If you cannot see the bottom of your feet, use a mirror or ask someone for help.  Do not cut corns or calluses or try to remove them with medicine.  If you  find a minor scrape, cut, or break in the skin on your feet, keep it and the skin around it clean and dry. You may clean these areas with mild soap and water. Do not clean the area with peroxide, alcohol, or iodine.  If you have a wound, scrape, corn, or callus on your foot, look at it several times a day to make sure it is healing and not infected. Check for: ? Redness, swelling, or pain. ? Fluid or blood. ? Warmth. ? Pus or a bad smell. General instructions  Do not cross your legs. This may decrease blood flow to your feet.  Do not use heating pads or hot water bottles on your feet. They may burn your skin. If you have lost feeling in your feet or legs, you may not know this is happening until it is too late.  Protect your feet from hot and cold by wearing shoes, such as at the beach or on hot pavement.  Schedule a complete foot exam at least once a year (annually) or more often if you have foot problems. If you have foot problems, report any cuts, sores, or bruises to your health care provider immediately. Contact a health care provider if:  You have a medical condition that increases your risk of infection and you have any cuts, sores, or bruises on your feet.  You have an injury that is not   healing.  You have redness on your legs or feet.  You feel burning or tingling in your legs or feet.  You have pain or cramps in your legs and feet.  Your legs or feet are numb.  Your feet always feel cold.  You have pain around a toenail. Get help right away if:  You have a wound, scrape, corn, or callus on your foot and: ? You have pain, swelling, or redness that gets worse. ? You have fluid or blood coming from the wound, scrape, corn, or callus. ? Your wound, scrape, corn, or callus feels warm to the touch. ? You have pus or a bad smell coming from the wound, scrape, corn, or callus. ? You have a fever. ? You have a red line going up your leg. Summary  Check your feet every day  for cuts, sores, red spots, swelling, and blisters.  Moisturize feet and legs daily.  Wear shoes that fit properly and have enough cushioning.  If you have foot problems, report any cuts, sores, or bruises to your health care provider immediately.  Schedule a complete foot exam at least once a year (annually) or more often if you have foot problems. This information is not intended to replace advice given to you by your health care provider. Make sure you discuss any questions you have with your health care provider. Document Released: 10/17/2000 Document Revised: 12/02/2017 Document Reviewed: 11/21/2016 Elsevier Patient Education  2020 [employer]   Onychomycosis/Fungal Toenails  WHAT IS IT? An infection that lies within the keratin of your nail plate that is caused by a fungus.  WHY ME? Fungal infections affect all ages, sexes, races, and creeds.  There may be many factors that predispose you to a fungal infection such as age, coexisting medical conditions such as diabetes, or an autoimmune disease; stress, medications, fatigue, genetics, etc.  Bottom line: fungus thrives in a warm, moist environment and your shoes offer such a location.  IS IT CONTAGIOUS? Theoretically, yes.  You do not want to share shoes, nail clippers or files with someone who has fungal toenails.  Walking around barefoot in the same room or sleeping in the same bed is unlikely to transfer the organism.  It is important to realize, however, that fungus can spread easily from one nail to the next on the same foot.  HOW DO WE TREAT THIS?  There are several ways to treat this condition.  Treatment may depend on many factors such as age, medications, pregnancy, liver and kidney conditions, etc.  It is best to ask your doctor which options are available to you.  1. No treatment.   Unlike many other medical concerns, you can live with this condition.  However for many people this can be a painful condition and may lead to  ingrown toenails or a bacterial infection.  It is recommended that you keep the nails cut short to help reduce the amount of fungal nail. 2. Topical treatment.  These range from herbal remedies to prescription strength nail lacquers.  About 40-50% effective, topicals require twice daily application for approximately 9 to 12 months or until an entirely new nail has grown out.  The most effective topicals are medical grade medications available through physicians offices. 3. Oral antifungal medications.  With an 80-90% cure rate, the most common oral medication requires 3 to 4 months of therapy and stays in your system for a year as the new nail grows out.  Oral antifungal medications do require   blood work to make sure it is a safe drug for you.  A liver function panel will be performed prior to starting the medication and after the first month of treatment.  It is important to have the blood work performed to avoid any harmful side effects.  In general, this medication safe but blood work is required. 4. Laser Therapy.  This treatment is performed by applying a specialized laser to the affected nail plate.  This therapy is noninvasive, fast, and non-painful.  It is not covered by insurance and is therefore, out of pocket.  The results have been very good with a 80-95% cure rate.  [facility] is the only practice in the area to offer this therapy. 5. Permanent Nail Avulsion.  Removing the entire nail so that a new nail will not grow back. 

## 2019-07-25 NOTE — Progress Notes (Signed)
Subjective: Jessica [name] presents to clinic with cc of painful mycotic toenails and callus left foot which are aggravated when weightbearing with and without shoe gear.  This pain limits her daily activities. Pain symptoms resolve with periodic professional debridement.  Binnie Rail, MD is her PCP.   Her daughter is present during the visit.   Current Outpatient Medications:  .  b complex vitamins tablet, Take 1 tablet by mouth daily., Disp: , Rfl:  .  blood glucose meter kit and supplies KIT, Dispense based on patient and insurance preference. Test sugars once daily and prn as directed. (FOR ICD-9 250.00, 250.01)., Disp: 1 each, Rfl: 0 .  carvedilol (COREG) 12.5 MG tablet, TAKE 1 TABLET BY MOUTH TWICE DAILY, Disp: 180 tablet, Rfl: 2 .  docusate sodium (COLACE) 100 MG capsule, Take 100 mg by mouth at bedtime., Disp: , Rfl:  .  ECHINACEA PO, Take 750 mg by mouth daily., Disp: , Rfl:  .  ELIQUIS 5 MG TABS tablet, Take 1 tablet by mouth twice daily, Disp: 180 tablet, Rfl: 1 .  fish oil-omega-3 fatty acids 1000 MG capsule, Take 1 g by mouth every morning. , Disp: , Rfl:  .  furosemide (LASIX) 20 MG tablet, TAKE 2 TABLETS BY MOUTH EVERY MORNING AND 1 TABLET EVERY AFTERNOON, Disp: 270 tablet, Rfl: 2 .  gabapentin (NEURONTIN) 100 MG capsule, TAKE 1 CAPSULE BY MOUTH AT NIGHT (TAKE IN ADDITION TO 300 MG CAPSULE AT NIGHT), Disp: 90 capsule, Rfl: 1 .  gabapentin (NEURONTIN) 300 MG capsule, TAKE 1 CAPSULE BY MOUTH THREE TIMES DAILY, Disp: 270 capsule, Rfl: 0 .  lisinopril (ZESTRIL) 5 MG tablet, Take 1.5 tablets (7.5 mg total) by mouth daily., Disp: 90 tablet, Rfl: 3 .  Misc. Devices (ROLLATOR ULTRA-LIGHT) MISC, With seat.  Diabetic neuropathy, knee arthritis, Disp: 1 each, Rfl: 0 .  nitroGLYCERIN (NITROSTAT) 0.4 MG SL tablet, Place 1 tablet (0.4 mg total) under the tongue every 5 (five) minutes as needed for chest pain. x3 doses as needed for chest pain, Disp: 25 tablet, Rfl: 0 .  potassium chloride  SA (K-DUR) 20 MEQ tablet, Take 1 tablet by mouth once daily, Disp: 90 tablet, Rfl: 1 .  rosuvastatin (CRESTOR) 5 MG tablet, TAKE 1 TABLET BY MOUTH AT BEDTIME, Disp: 90 tablet, Rfl: 2   Allergies  Allergen Reactions  . Lyrica [Pregabalin] Swelling    Caused weight gain  . Amlodipine Swelling  . Sulfonamide Derivatives Rash     Objective: Physical Examination:  Vascular  Examination: Capillary refill time <3 seconds x 10 digits.  Palpable DP/PT pulses b/l.  Digital hair absent b/l.  No edema noted b/l.  Skin temperature gradient WNL b/l.  Dermatological Examination: Skin with normal turgor, texture and tone b/l.  No open wounds b/l.  No interdigital macerations noted b/l.  Elongated, thick, discolored brittle toenails with subungual debris and pain on dorsal palpation of nailbeds 1-5 b/l.  Porokeratotic lesion submet head 4 left foot with tenderness to palpation. No erythema, no edema, no drainage, no flocculence.  Musculoskeletal Examination: Muscle strength 5/5 to all muscle groups b/l.  No pain, crepitus or joint discomfort with active/passive ROM.  Neurological Examination: Sensation decreased b/l with 10 gram monofilament.  Assessment: 1. Mycotic nail infection with pain 1-5 b/l 2. Porokeratosis submet head 4 left foot 3. NIDDM with neuropathy  Plan: 1. Toenails 1-5 b/l were debrided in length and girth without iatrogenic laceration. 2. Porokeratosis submet head 4 left foot pared and enucleated with  sterile scalpel blade without incident. 3. Continue soft, supportive shoe gear daily. 4. Report any pedal injuries to medical professional. 5. Follow up 3 months. 6. Patient/POA to call should there be a question/concern in there interim.

## 2019-07-28 NOTE — Progress Notes (Signed)
Remote pacemaker transmission.   

## 2019-07-29 ENCOUNTER — Encounter: Payer: Self-pay | Admitting: Internal Medicine

## 2019-08-29 ENCOUNTER — Other Ambulatory Visit: Payer: Self-pay | Admitting: Cardiology

## 2019-08-29 ENCOUNTER — Other Ambulatory Visit: Payer: Self-pay | Admitting: Internal Medicine

## 2019-08-29 DIAGNOSIS — I10 Essential (primary) hypertension: Secondary | ICD-10-CM

## 2019-08-29 DIAGNOSIS — E7800 Pure hypercholesterolemia, unspecified: Secondary | ICD-10-CM

## 2019-08-29 DIAGNOSIS — I2510 Atherosclerotic heart disease of native coronary artery without angina pectoris: Secondary | ICD-10-CM

## 2019-08-29 DIAGNOSIS — I5043 Acute on chronic combined systolic (congestive) and diastolic (congestive) heart failure: Secondary | ICD-10-CM

## 2019-09-05 ENCOUNTER — Other Ambulatory Visit: Payer: Self-pay | Admitting: Cardiology

## 2019-09-05 DIAGNOSIS — I110 Hypertensive heart disease with heart failure: Secondary | ICD-10-CM

## 2019-09-05 DIAGNOSIS — E860 Dehydration: Secondary | ICD-10-CM

## 2019-09-21 ENCOUNTER — Telehealth (INDEPENDENT_AMBULATORY_CARE_PROVIDER_SITE_OTHER): Payer: Medicare Other | Admitting: Internal Medicine

## 2019-09-21 ENCOUNTER — Encounter: Payer: Self-pay | Admitting: Internal Medicine

## 2019-09-21 VITALS — BP 144/89 | HR 77 | Ht 63.0 in | Wt 160.0 lb

## 2019-09-21 DIAGNOSIS — I4821 Permanent atrial fibrillation: Secondary | ICD-10-CM

## 2019-09-21 DIAGNOSIS — I2510 Atherosclerotic heart disease of native coronary artery without angina pectoris: Secondary | ICD-10-CM | POA: Diagnosis not present

## 2019-09-21 DIAGNOSIS — R001 Bradycardia, unspecified: Secondary | ICD-10-CM | POA: Diagnosis not present

## 2019-09-21 NOTE — Progress Notes (Signed)
Electrophysiology TeleHealth Note  Due to national recommendations of social distancing due to [place] 19, an audio telehealth visit is felt to be most appropriate for this patient at this time.  Verbal consent was obtained by me for the telehealth visit today.  The patient does not have capability for a virtual visit.  A phone visit is therefore required today.   Date:  09/21/2019   ID:  Jessica [name], DOB [birth year], MRN [id number]  Location: patient's home  Provider location:  [facility]  Evaluation Performed: Follow-up visit  PCP:  Binnie Rail, MD   Electrophysiologist:  Dr [name]  Chief Complaint:  Pacemaker check  History of Present Illness:    Jessica [name] is a 85 y.o. female who presents via telehealth conferencing today.  She is hard of hearing and her daughter is facilitating the visit today. The patient has not had any CV issues.  No CP or SOB.   Past Medical History:  Diagnosis Date  . Breast cancer ([place])   . CAD (coronary artery disease)    a. s/p DESx2 to mid and distal RCA in 2010.  Marland Kitchen Cholelithiasis 09/12/2013   Lap Chole on 09/14/13   . Chronic combined systolic and diastolic CHF (congestive heart failure) ([place])    a. Echo 06/09/15: EF 55-60% -> 10/2017 EF 30-35% (pt and daughter did not wish to proceed with ischemic assessment - conservative rx).  . CKD (chronic kidney disease), stage III   . Diabetes mellitus    NON INSULIN DEPENDENT  . DJD (degenerative joint disease)   . GERD (gastroesophageal reflux disease)   . Hemorrhoids   . History of diabetic neuropathy   . Hypercholesterolemia   . Hypertension   . Hypertensive heart disease   . Persistent atrial fibrillation ([place])    a. discovered on PPM interrogation 12/15, CHAD2VASC score is 6. b. decompensation with dCHF in 06/2015 possibly due to AF, s/p DCCV.Marland Kitchen c. 2018-2019 increase in burden.  . SSS (sick sinus syndrome) ([place])    a. s/p [employer] PPM by JA for SSS and syncope 9/11.    Past  Surgical History:  Procedure Laterality Date  . CARDIAC CATHETERIZATION  07/05/2009   EF 55-60%  . CARDIOVERSION N/A 06/11/2015   Procedure: CARDIOVERSION;  Surgeon: [name], MD;  Location: [facility] ENDOSCOPY;  Service: Cardiovascular;  Laterality: N/A;  . CHOLECYSTECTOMY N/A 09/14/2013   Procedure: LAPAROSCOPIC CHOLECYSTECTOMY WITH INTRAOPERATIVE CHOLANGIOGRAM;  Surgeon: [name], MD;  Location: [place];  Service: General;  Laterality: N/A;  . COLONOSCOPY    . ERCP N/A 09/13/2013   Procedure: ENDOSCOPIC RETROGRADE CHOLANGIOPANCREATOGRAPHY (ERCP);  Surgeon: [name], MD;  Location: [facility] ENDOSCOPY;  Service: Endoscopy;  Laterality: N/A;  . INSERT / REPLACE / REMOVE PACEMAKER  9/11   SSS and syncope, implanted by JA (MDT)  . MASTECTOMY  1992   BILATERAL WITH RECONSTRUCTION    Current Outpatient Medications  Medication Sig Dispense Refill  . b complex vitamins tablet Take 1 tablet by mouth daily.    . blood glucose meter kit and supplies KIT Dispense based on patient and insurance preference. Test sugars once daily and prn as directed. (FOR ICD-9 250.00, 250.01). 1 each 0  . carvedilol (COREG) 12.5 MG tablet Take 1 tablet (12.5 mg total) by mouth 2 (two) times daily with a meal. 180 tablet 1  . docusate sodium (COLACE) 100 MG capsule Take 100 mg by mouth at bedtime.    Marland Kitchen ECHINACEA PO  Take 750 mg by mouth daily.    Marland Kitchen ELIQUIS 5 MG TABS tablet Take 1 tablet by mouth twice daily 180 tablet 1  . fish oil-omega-3 fatty acids 1000 MG capsule Take 1 g by mouth every morning.     . furosemide (LASIX) 20 MG tablet TAKE 2 TABLETS BY MOUTH EVERY MORNING AND 1 TABLET EVERY AFTERNOON 270 tablet 2  . gabapentin (NEURONTIN) 100 MG capsule TAKE 1 CAPSULE BY MOUTH AT NIGHT (TAKE IN ADDITION TO 300 MG CAPSULE AT NIGHT) 90 capsule 1  . gabapentin (NEURONTIN) 300 MG capsule TAKE 1 CAPSULE BY MOUTH THREE TIMES DAILY 270 capsule 0  . Misc. Devices (ROLLATOR ULTRA-LIGHT) MISC With seat.  Diabetic  neuropathy, knee arthritis 1 each 0  . nitroGLYCERIN (NITROSTAT) 0.4 MG SL tablet Place 1 tablet (0.4 mg total) under the tongue every 5 (five) minutes as needed for chest pain. x3 doses as needed for chest pain 25 tablet 0  . potassium chloride SA (K-DUR) 20 MEQ tablet Take 1 tablet by mouth once daily 90 tablet 1  . rosuvastatin (CRESTOR) 5 MG tablet TAKE 1 TABLET BY MOUTH AT BEDTIME (PT  NEEDS  APPOINTMENT  FOR  FURTHER  REFILLS) 90 tablet 0  . lisinopril (ZESTRIL) 5 MG tablet Take 1.5 tablets (7.5 mg total) by mouth daily. 90 tablet 3   No current facility-administered medications for this visit.     Allergies:   Lyrica [pregabalin], Amlodipine, and Sulfonamide derivatives   Social History:  The patient  reports that she has never smoked. She has never used smokeless tobacco. She reports that she does not drink alcohol or use drugs.   Family History:  The patient's family history includes Cancer in her father and mother.   ROS:  Pt unable to provide due to poor hearing   Exam:    Vital Signs:  BP (!) 144/89   Pulse 77   Ht 5' 3"  (1.6 m)   Wt 160 lb (72.6 kg)   LMP  (LMP Unknown)   BMI 28.34 kg/m   Hard of hearing,  Daughter facilitated the visit  Labs/Other Tests and Data Reviewed:    Recent Labs: 12/27/2018: TSH 4.310 04/13/2019: ALT 11; BUN 25; Creatinine, Ser 1.16; Hemoglobin 13.9; Platelets 205.0; Potassium 4.3; Sodium 142   Wt Readings from Last 3 Encounters:  09/21/19 160 lb (72.6 kg)  06/21/19 161 lb 12.8 oz (73.4 kg)  04/13/19 157 lb (71.2 kg)     Last device remote is reviewed from [place] PDF which reveals normal device function,  100% afib   ASSESSMENT & PLAN:    1.  Symptomatic bradycardia Remotes are uptodate Normal device function  2. Permanent afib On eliquis for chads2vasc score of 6  3. CAD No ischemic symptoms  4. Chronic systolic dysfunction Stable No change required today  5. HTN Stable No change required today  Follow-up:  12  months with Renee   Patient Risk:  after full review of this patients clinical status, I feel that they are at moderate risk at this time.  Today, I have spent 15 minutes with the patient with telehealth technology discussing arrhythmia management .    Army Fossa, MD  09/21/2019 2:49 PM     [address] [place] [address] [phone] (office) [phone] (fax)

## 2019-10-08 ENCOUNTER — Other Ambulatory Visit: Payer: Self-pay | Admitting: Internal Medicine

## 2019-10-12 ENCOUNTER — Ambulatory Visit (INDEPENDENT_AMBULATORY_CARE_PROVIDER_SITE_OTHER): Payer: Medicare Other | Admitting: *Deleted

## 2019-10-12 DIAGNOSIS — Z950 Presence of cardiac pacemaker: Secondary | ICD-10-CM | POA: Diagnosis not present

## 2019-10-13 LAB — CUP PACEART REMOTE DEVICE CHECK
Battery Impedance: 1298 Ohm
Battery Remaining Longevity: 49 mo
Battery Voltage: 2.78 V
Brady Statistic AP VP Percent: 6 %
Brady Statistic AP VS Percent: 0 %
Brady Statistic AS VP Percent: 7 %
Brady Statistic AS VS Percent: 87 %
Date Time Interrogation Session: 20201210130351
Implantable Lead Implant Date: 20110912
Implantable Lead Implant Date: 20110912
Implantable Lead Location: 753859
Implantable Lead Location: 753860
Implantable Lead Model: 5076
Implantable Lead Model: 5092
Implantable Pulse Generator Implant Date: 20110912
Lead Channel Impedance Value: 517 Ohm
Lead Channel Impedance Value: 673 Ohm
Lead Channel Pacing Threshold Amplitude: 0.625 V
Lead Channel Pacing Threshold Amplitude: 0.75 V
Lead Channel Pacing Threshold Pulse Width: 0.4 ms
Lead Channel Pacing Threshold Pulse Width: 0.4 ms
Lead Channel Setting Pacing Amplitude: 2 V
Lead Channel Setting Pacing Amplitude: 2.5 V
Lead Channel Setting Pacing Pulse Width: 0.4 ms
Lead Channel Setting Sensing Sensitivity: 2 mV

## 2019-10-14 ENCOUNTER — Ambulatory Visit: Payer: Medicare Other | Admitting: Internal Medicine

## 2019-10-18 ENCOUNTER — Other Ambulatory Visit: Payer: Self-pay

## 2019-10-18 ENCOUNTER — Encounter: Payer: Self-pay | Admitting: Podiatry

## 2019-10-18 ENCOUNTER — Ambulatory Visit: Payer: Medicare Other | Admitting: Podiatry

## 2019-10-18 DIAGNOSIS — M79676 Pain in unspecified toe(s): Secondary | ICD-10-CM | POA: Diagnosis not present

## 2019-10-18 DIAGNOSIS — Q828 Other specified congenital malformations of skin: Secondary | ICD-10-CM | POA: Diagnosis not present

## 2019-10-18 DIAGNOSIS — B351 Tinea unguium: Secondary | ICD-10-CM | POA: Diagnosis not present

## 2019-10-18 DIAGNOSIS — E1142 Type 2 diabetes mellitus with diabetic polyneuropathy: Secondary | ICD-10-CM

## 2019-10-18 NOTE — Progress Notes (Signed)
Subjective:    Patient ID: Jessica [name], female    DOB: 05/08/1927, 85 y.o.   MRN: [id number]  HPI The patient is here for follow up.  Afib, CHF, Hypertension: She is taking her medication daily. She is compliant with a low sodium diet.  She denies chest pain, palpitations, edema, shortness of breath and regular headaches.   Diabetes with neuropathy: She is controlling her sugars with diet. She is compliant with a diabetic diet.  She does have neuropathy of bilateral lower extremities and is taking her gabapentin as prescribed.  The medication does make her drowsy.  The increase in dose has improved her neuropathy pain.  Hyperlipidemia: She is taking her medication daily. She is compliant with a low fat/cholesterol diet. She denies myalgias.   CKD:  She tries to drink water throughout the day.    Dysuria: She has been experiencing some dysuria for a while.  She is unsure how long and did not mention it to her daughter.  She does urinate frequently because she is on the water pill.  She has not seen any blood in her urine and denies abdominal pain.   Two weeks ago she had an episode of not feeling well that lasted about 24 hours.  Tuesday night she felt fine, but when she woke up Wednesday she was out of it.  She had decreased appetite, slept all day, had generalized weakness and was a little bit more irritated than usual.  The next day she was better and the following day she was even better.  She has not had a recurrence and they are unsure why this occurred.      Medications and allergies reviewed with patient and updated if appropriate.  Patient Active Problem List   Diagnosis Date Noted  . H/O bilateral mastectomy 10/13/2018  . HX: breast cancer 10/13/2018  . Permanent atrial fibrillation ([place]) 10/31/2017  . Cardiac pacemaker in situ   . Chronic combined systolic and diastolic CHF (congestive heart failure) ([place]) 10/12/2017  . Bilateral leg edema 04/29/2016  . CKD (chronic  kidney disease) stage 3, GFR 30-59 ml/min (H[facility]) 06/10/2015  . CAD (coronary artery disease) 06/09/2015  . Diabetic neuropathy ([place]) 07/06/2013  . SSS (sick sinus syndrome) ([place]) 02/10/2013  . Hypertension 07/30/2012  . Pacemaker-Medtronic 07/19/2012  . Benign hypertensive heart disease with heart failure ([place]) 01/28/2012  . Diabetes ([place]) 10/22/2010  . HYPERCHOLESTEROLEMIA 10/22/2010  . DEGENERATIVE JOINT DISEASE 10/22/2010    Current Outpatient Medications on File Prior to Visit  Medication Sig Dispense Refill  . b complex vitamins tablet Take 1 tablet by mouth daily.    . blood glucose meter kit and supplies KIT Dispense based on patient and insurance preference. Test sugars once daily and prn as directed. (FOR ICD-9 250.00, 250.01). 1 each 0  . carvedilol (COREG) 12.5 MG tablet Take 1 tablet (12.5 mg total) by mouth 2 (two) times daily with a meal. 180 tablet 1  . docusate sodium (COLACE) 100 MG capsule Take 100 mg by mouth at bedtime.    Marland Kitchen ECHINACEA PO Take 750 mg by mouth daily.    Marland Kitchen ELIQUIS 5 MG TABS tablet Take 1 tablet by mouth twice daily 180 tablet 1  . fish oil-omega-3 fatty acids 1000 MG capsule Take 1 g by mouth every morning.     . furosemide (LASIX) 20 MG tablet TAKE 2 TABLETS BY MOUTH EVERY MORNING AND 1 TABLET EVERY AFTERNOON 270 tablet 2  . gabapentin (NEURONTIN) 100  MG capsule TAKE 1 CAPSULE BY MOUTH NIGHTLY TAKE  IN  ADDITION  TO  300  MG  CAPSULE  AT  NIGHT 90 capsule 0  . gabapentin (NEURONTIN) 300 MG capsule TAKE 1 CAPSULE BY MOUTH THREE TIMES DAILY 270 capsule 0  . Misc. Devices (ROLLATOR ULTRA-LIGHT) MISC With seat.  Diabetic neuropathy, knee arthritis 1 each 0  . nitroGLYCERIN (NITROSTAT) 0.4 MG SL tablet Place 1 tablet (0.4 mg total) under the tongue every 5 (five) minutes as needed for chest pain. x3 doses as needed for chest pain 25 tablet 0  . potassium chloride SA (KLOR-CON) 20 MEQ tablet TAKE 1  BY MOUTH ONCE DAILY 90 tablet 0  . rosuvastatin (CRESTOR) 5 MG  tablet TAKE 1 TABLET BY MOUTH AT BEDTIME (PT  NEEDS  APPOINTMENT  FOR  FURTHER  REFILLS) 90 tablet 0  . lisinopril (ZESTRIL) 5 MG tablet Take 1.5 tablets (7.5 mg total) by mouth daily. 90 tablet 3   No current facility-administered medications on file prior to visit.    Past Medical History:  Diagnosis Date  . Breast cancer ([place])   . CAD (coronary artery disease)    a. s/p DESx2 to mid and distal RCA in 2010.  Marland Kitchen Cholelithiasis 09/12/2013   Lap Chole on 09/14/13   . Chronic combined systolic and diastolic CHF (congestive heart failure) ([place])    a. Echo 06/09/15: EF 55-60% -> 10/2017 EF 30-35% (pt and daughter did not wish to proceed with ischemic assessment - conservative rx).  . CKD (chronic kidney disease), stage III   . Diabetes mellitus    NON INSULIN DEPENDENT  . DJD (degenerative joint disease)   . GERD (gastroesophageal reflux disease)   . Hemorrhoids   . History of diabetic neuropathy   . Hypercholesterolemia   . Hypertension   . Hypertensive heart disease   . Persistent atrial fibrillation ([place])    a. discovered on PPM interrogation 12/15, CHAD2VASC score is 6. b. decompensation with dCHF in 06/2015 possibly due to AF, s/p DCCV.Marland Kitchen c. 2018-2019 increase in burden.  . SSS (sick sinus syndrome) ([place])    a. s/p Medtronic PPM by JA for SSS and syncope 9/11.    Past Surgical History:  Procedure Laterality Date  . CARDIAC CATHETERIZATION  07/05/2009   EF 55-60%  . CARDIOVERSION N/A 06/11/2015   Procedure: CARDIOVERSION;  Surgeon: [name], MD;  Location: [facility] ENDOSCOPY;  Service: Cardiovascular;  Laterality: N/A;  . CHOLECYSTECTOMY N/A 09/14/2013   Procedure: LAPAROSCOPIC CHOLECYSTECTOMY WITH INTRAOPERATIVE CHOLANGIOGRAM;  Surgeon: [name], MD;  Location: [facility];  Service: General;  Laterality: N/A;  . COLONOSCOPY    . ERCP N/A 09/13/2013   Procedure: ENDOSCOPIC RETROGRADE CHOLANGIOPANCREATOGRAPHY (ERCP);  Surgeon: [name], MD;  Location: [facility] ENDOSCOPY;   Service: Endoscopy;  Laterality: N/A;  . INSERT / REPLACE / REMOVE PACEMAKER  9/11   SSS and syncope, implanted by JA (MDT)  . MASTECTOMY  1992   BILATERAL WITH RECONSTRUCTION    Social History   Socioeconomic History  . Marital status: Widowed    Spouse name: Not on file  . Number of children: Not on file  . Years of education: Not on file  . Highest education level: Not on file  Occupational History  . Not on file  Tobacco Use  . Smoking status: Never Smoker  . Smokeless tobacco: Never Used  Substance and Sexual Activity  . Alcohol use: No    Alcohol/week: 0.0 standard drinks  .  Drug use: No  . Sexual activity: Not on file  Other Topics Concern  . Not on file  Social History Narrative  . Not on file   Social Determinants of Health   Financial Resource Strain:   . Difficulty of Paying Living Expenses: Not on file  Food Insecurity:   . Worried About Charity fundraiser in the Last Year: Not on file  . Ran Out of Food in the Last Year: Not on file  Transportation Needs:   . Lack of Transportation (Medical): Not on file  . Lack of Transportation (Non-Medical): Not on file  Physical Activity:   . Days of Exercise per Week: Not on file  . Minutes of Exercise per Session: Not on file  Stress:   . Feeling of Stress : Not on file  Social Connections:   . Frequency of Communication with Friends and Family: Not on file  . Frequency of Social Gatherings with Friends and Family: Not on file  . Attends Religious Services: Not on file  . Active Member of Clubs or Organizations: Not on file  . Attends Archivist Meetings: Not on file  . Marital Status: Not on file    Family History  Problem Relation Age of Onset  . Cancer Mother   . Cancer Father     Review of Systems  Constitutional: Negative for chills and fever.  Respiratory: Positive for shortness of breath (mild, no change). Negative for cough and wheezing.   Cardiovascular: Positive for leg swelling  (chronic, no change). Negative for chest pain and palpitations.  Genitourinary: Positive for dysuria and frequency (on lasix).  Neurological: Negative for dizziness, light-headedness and headaches.       Objective:   Vitals:   10/19/19 1442  BP: (!) 170/88  Pulse: 74  Resp: 16  Temp: 97.9 F (36.6 C)  SpO2: 96%   BP Readings from Last 3 Encounters:  10/19/19 (!) 170/88  09/21/19 (!) 144/89  06/21/19 138/70   Wt Readings from Last 3 Encounters:  10/19/19 161 lb 12.8 oz (73.4 kg)  09/21/19 160 lb (72.6 kg)  06/21/19 161 lb 12.8 oz (73.4 kg)   Body mass index is 28.66 kg/m.   Physical Exam    Constitutional: Appears well-developed and well-nourished. No distress.  HENT:  Head: Normocephalic and atraumatic.  Neck: Neck supple. No tracheal deviation present. No thyromegaly present.  No cervical lymphadenopathy Cardiovascular: Normal rate, regular rhythm and normal heart sounds.   No murmur heard. No carotid bruit .  No edema Pulmonary/Chest: Effort normal and breath sounds normal. No respiratory distress. No has no wheezes. No rales.  Skin: Skin is warm and dry. Not diaphoretic.  Psychiatric: Normal mood and affect. Behavior is normal.      Assessment & Plan:    Unsure what the cause of her episode 2 weeks ago was.  Symptoms lasted approximately 24 hours.  No strokelike symptoms.  May have been related to several things.  Will check basic blood work today, but will not do further evaluation since this was transient.  If she has the symptoms again may need to be seen at that time.  See Problem List for Assessment and Plan of chronic medical problems.    This visit occurred during the SARS-CoV-2 public health emergency.  Safety protocols were in place, including screening questions prior to the visit, additional usage of staff PPE, and extensive cleaning of exam room while observing appropriate contact time as indicated for disinfecting solutions.

## 2019-10-18 NOTE — Patient Instructions (Addendum)
  Tests ordered today. Your results will be released to [place] (or called to you) after review.  If any changes need to be made, you will be notified at that same time.    Medications reviewed and updated.  Changes include :   Stop afternoon dose of gabapentin.       Please followup in 6 months

## 2019-10-18 NOTE — Patient Instructions (Signed)
Diabetes Mellitus and Foot Care Foot care is an important part of your health, especially when you have diabetes. Diabetes may cause you to have problems because of poor blood flow (circulation) to your feet and legs, which can cause your skin to:  Become thinner and drier.  Break more easily.  Heal more slowly.  Peel and crack. You may also have nerve damage (neuropathy) in your legs and feet, causing decreased feeling in them. This means that you may not notice minor injuries to your feet that could lead to more serious problems. Noticing and addressing any potential problems early is the best way to prevent future foot problems. How to care for your feet Foot hygiene  Wash your feet daily with warm water and mild soap. Do not use hot water. Then, pat your feet and the areas between your toes until they are completely dry. Do not soak your feet as this can dry your skin.  Trim your toenails straight across. Do not dig under them or around the cuticle. File the edges of your nails with an emery board or nail file.  Apply a moisturizing lotion or petroleum jelly to the skin on your feet and to dry, brittle toenails. Use lotion that does not contain alcohol and is unscented. Do not apply lotion between your toes. Shoes and socks  Wear clean socks or stockings every day. Make sure they are not too tight. Do not wear knee-high stockings since they may decrease blood flow to your legs.  Wear shoes that fit properly and have enough cushioning. Always look in your shoes before you put them on to be sure there are no objects inside.  To break in new shoes, wear them for just a few hours a day. This prevents injuries on your feet. Wounds, scrapes, corns, and calluses  Check your feet daily for blisters, cuts, bruises, sores, and redness. If you cannot see the bottom of your feet, use a mirror or ask someone for help.  Do not cut corns or calluses or try to remove them with medicine.  If you  find a minor scrape, cut, or break in the skin on your feet, keep it and the skin around it clean and dry. You may clean these areas with mild soap and water. Do not clean the area with peroxide, alcohol, or iodine.  If you have a wound, scrape, corn, or callus on your foot, look at it several times a day to make sure it is healing and not infected. Check for: ? Redness, swelling, or pain. ? Fluid or blood. ? Warmth. ? Pus or a bad smell. General instructions  Do not cross your legs. This may decrease blood flow to your feet.  Do not use heating pads or hot water bottles on your feet. They may burn your skin. If you have lost feeling in your feet or legs, you may not know this is happening until it is too late.  Protect your feet from hot and cold by wearing shoes, such as at the beach or on hot pavement.  Schedule a complete foot exam at least once a year (annually) or more often if you have foot problems. If you have foot problems, report any cuts, sores, or bruises to your health care provider immediately. Contact a health care provider if:  You have a medical condition that increases your risk of infection and you have any cuts, sores, or bruises on your feet.  You have an injury that is not   healing.  You have redness on your legs or feet.  You feel burning or tingling in your legs or feet.  You have pain or cramps in your legs and feet.  Your legs or feet are numb.  Your feet always feel cold.  You have pain around a toenail. Get help right away if:  You have a wound, scrape, corn, or callus on your foot and: ? You have pain, swelling, or redness that gets worse. ? You have fluid or blood coming from the wound, scrape, corn, or callus. ? Your wound, scrape, corn, or callus feels warm to the touch. ? You have pus or a bad smell coming from the wound, scrape, corn, or callus. ? You have a fever. ? You have a red line going up your leg. Summary  Check your feet every day  for cuts, sores, red spots, swelling, and blisters.  Moisturize feet and legs daily.  Wear shoes that fit properly and have enough cushioning.  If you have foot problems, report any cuts, sores, or bruises to your health care provider immediately.  Schedule a complete foot exam at least once a year (annually) or more often if you have foot problems. This information is not intended to replace advice given to you by your health care provider. Make sure you discuss any questions you have with your health care provider. Document Released: 10/17/2000 Document Revised: 12/02/2017 Document Reviewed: 11/21/2016 Elsevier Patient Education  2020 [employer].  

## 2019-10-19 ENCOUNTER — Encounter: Payer: Self-pay | Admitting: Internal Medicine

## 2019-10-19 ENCOUNTER — Other Ambulatory Visit (INDEPENDENT_AMBULATORY_CARE_PROVIDER_SITE_OTHER): Payer: Medicare Other

## 2019-10-19 ENCOUNTER — Ambulatory Visit (INDEPENDENT_AMBULATORY_CARE_PROVIDER_SITE_OTHER): Payer: Medicare Other | Admitting: Internal Medicine

## 2019-10-19 VITALS — BP 170/88 | HR 74 | Temp 97.9°F | Resp 16 | Ht 63.0 in | Wt 161.8 lb

## 2019-10-19 DIAGNOSIS — E1140 Type 2 diabetes mellitus with diabetic neuropathy, unspecified: Secondary | ICD-10-CM

## 2019-10-19 DIAGNOSIS — I10 Essential (primary) hypertension: Secondary | ICD-10-CM

## 2019-10-19 DIAGNOSIS — I4821 Permanent atrial fibrillation: Secondary | ICD-10-CM

## 2019-10-19 DIAGNOSIS — E7800 Pure hypercholesterolemia, unspecified: Secondary | ICD-10-CM

## 2019-10-19 DIAGNOSIS — N1831 Chronic kidney disease, stage 3a: Secondary | ICD-10-CM

## 2019-10-19 DIAGNOSIS — R600 Localized edema: Secondary | ICD-10-CM

## 2019-10-19 DIAGNOSIS — E0842 Diabetes mellitus due to underlying condition with diabetic polyneuropathy: Secondary | ICD-10-CM

## 2019-10-19 DIAGNOSIS — I5042 Chronic combined systolic (congestive) and diastolic (congestive) heart failure: Secondary | ICD-10-CM

## 2019-10-19 DIAGNOSIS — R300 Dysuria: Secondary | ICD-10-CM

## 2019-10-19 DIAGNOSIS — Z23 Encounter for immunization: Secondary | ICD-10-CM | POA: Diagnosis not present

## 2019-10-19 LAB — CBC WITH DIFFERENTIAL/PLATELET
Basophils Absolute: 0.1 10*3/uL (ref 0.0–0.1)
Basophils Relative: 0.7 % (ref 0.0–3.0)
Eosinophils Absolute: 0.1 10*3/uL (ref 0.0–0.7)
Eosinophils Relative: 1.3 % (ref 0.0–5.0)
HCT: 41.3 % (ref 36.0–46.0)
Hemoglobin: 13.8 g/dL (ref 12.0–15.0)
Lymphocytes Relative: 23.9 % (ref 12.0–46.0)
Lymphs Abs: 2.2 10*3/uL (ref 0.7–4.0)
MCHC: 33.5 g/dL (ref 30.0–36.0)
MCV: 89.7 fl (ref 78.0–100.0)
Monocytes Absolute: 0.5 10*3/uL (ref 0.1–1.0)
Monocytes Relative: 5.4 % (ref 3.0–12.0)
Neutro Abs: 6.3 10*3/uL (ref 1.4–7.7)
Neutrophils Relative %: 68.7 % (ref 43.0–77.0)
Platelets: 271 10*3/uL (ref 150.0–400.0)
RBC: 4.6 Mil/uL (ref 3.87–5.11)
RDW: 14.6 % (ref 11.5–15.5)
WBC: 9.1 10*3/uL (ref 4.0–10.5)

## 2019-10-19 LAB — URINALYSIS, ROUTINE W REFLEX MICROSCOPIC
Bilirubin Urine: NEGATIVE
Hgb urine dipstick: NEGATIVE
Ketones, ur: NEGATIVE
Nitrite: NEGATIVE
RBC / HPF: NONE SEEN (ref 0–?)
Specific Gravity, Urine: 1.01 (ref 1.000–1.030)
Total Protein, Urine: NEGATIVE
Urine Glucose: NEGATIVE
Urobilinogen, UA: 0.2 (ref 0.0–1.0)
pH: 6 (ref 5.0–8.0)

## 2019-10-19 LAB — HEMOGLOBIN A1C: Hgb A1c MFr Bld: 7 % — ABNORMAL HIGH (ref 4.6–6.5)

## 2019-10-19 LAB — LIPID PANEL
Cholesterol: 129 mg/dL (ref 0–200)
HDL: 41.6 mg/dL (ref >39.00)
LDL Cholesterol: 73 mg/dL (ref 0–99)
NonHDL: 87.52
Total CHOL/HDL Ratio: 3
Triglycerides: 75 mg/dL (ref 0.0–149.0)
VLDL: 15 mg/dL (ref 0.0–40.0)

## 2019-10-19 LAB — COMPREHENSIVE METABOLIC PANEL
ALT: 16 U/L (ref 0–35)
AST: 15 U/L (ref 0–37)
Albumin: 4 g/dL (ref 3.5–5.2)
Alkaline Phosphatase: 116 U/L (ref 39–117)
BUN: 26 mg/dL — ABNORMAL HIGH (ref 6–23)
CO2: 30 mEq/L (ref 19–32)
Calcium: 10.4 mg/dL (ref 8.4–10.5)
Chloride: 106 mEq/L (ref 96–112)
Creatinine, Ser: 1.1 mg/dL (ref 0.40–1.20)
GFR: 46.6 mL/min — ABNORMAL LOW (ref >60.00)
Glucose, Bld: 124 mg/dL — ABNORMAL HIGH (ref 70–99)
Potassium: 4.4 mEq/L (ref 3.5–5.1)
Sodium: 141 mEq/L (ref 135–145)
Total Bilirubin: 0.6 mg/dL (ref 0.2–1.2)
Total Protein: 7.4 g/dL (ref 6.0–8.3)

## 2019-10-19 LAB — TSH: TSH: 4.47 u[IU]/mL (ref 0.35–4.50)

## 2019-10-19 NOTE — Assessment & Plan Note (Signed)
Appears to be euvolemic Following with cardiology Continue furosemide at current dose

## 2019-10-19 NOTE — Assessment & Plan Note (Addendum)
Taking 300 milligrams in am and afternoon, and 400 mg in evening Sleepy during the day, which may be related to gabapentin Stop afternoon dose-continue morning and evening dose Consider cymbatla if pain not controlled

## 2019-10-19 NOTE — Assessment & Plan Note (Signed)
Urinalysis, urine culture 

## 2019-10-19 NOTE — Assessment & Plan Note (Signed)
Diet controlled We will check A1c

## 2019-10-19 NOTE — Assessment & Plan Note (Signed)
Blood pressure high here today, but typically lower Continue current medications

## 2019-10-19 NOTE — Assessment & Plan Note (Signed)
Stable, chronic Continue furosemide

## 2019-10-19 NOTE — Assessment & Plan Note (Signed)
Chronic Drinking water throughout the day CMP

## 2019-10-19 NOTE — Assessment & Plan Note (Signed)
Check lipid panel, CMP Continue statin 

## 2019-10-19 NOTE — Assessment & Plan Note (Addendum)
Following with cardiology Asymptomatic On Eliquis CBC, CMP, TSH

## 2019-10-21 ENCOUNTER — Other Ambulatory Visit: Payer: Self-pay | Admitting: Internal Medicine

## 2019-10-21 LAB — URINE CULTURE
MICRO NUMBER:: 1204093
SPECIMEN QUALITY:: ADEQUATE

## 2019-10-21 MED ORDER — CIPROFLOXACIN HCL 250 MG PO TABS: 250.0000 mg | ORAL_TABLET | Freq: Two times a day (BID) | ORAL | 0 refills | Status: DC

## 2019-10-27 ENCOUNTER — Other Ambulatory Visit: Payer: Self-pay | Admitting: Internal Medicine

## 2019-10-27 DIAGNOSIS — I5042 Chronic combined systolic (congestive) and diastolic (congestive) heart failure: Secondary | ICD-10-CM

## 2019-10-27 DIAGNOSIS — I5033 Acute on chronic diastolic (congestive) heart failure: Secondary | ICD-10-CM

## 2019-10-27 NOTE — Telephone Encounter (Signed)
Pt last saw Dr [name] 09/21/19 telemedicine Covid-19, last labs 10/19/19 Creat 1.10, age 85, weight 73.4kg, based on specified criteria pt is on appropriate dosage of Eliquis 5mg  BID.  Will refill rx.

## 2019-10-30 NOTE — Progress Notes (Signed)
Subjective:  Jessica [name] presents to clinic with h/o diabetes and neuropathy today. She presents for preventative diabetic foot care with cc of  painful, thick, discolored, elongated toenails  of both feet that become tender and patient cannot cut because of thickness. Pain is aggravated when wearing enclosed shoe gear and relieved with periodic professional debridement.  Patient voices no new pedal concerns on today's visit.  Medications reviewed in chart.  Allergies  Allergen Reactions  . Lyrica [Pregabalin] Swelling    Caused weight gain  . Amlodipine Swelling  . Sulfonamide Derivatives Rash    Objective:  Physical Examination:  Vascular Examination: Capillary refill time to digits <3 seconds b/l.   Dorsalis pedis and posterior tibial pulses are palpable b/l.  Digital hair absent b/l.  Skin temperature gradient WNL b/l.  Dermatological Examination: Skin with normal turgor, texture and tone b/l.  Elongated, thick, discolored brittle toenails with subungual debris and pain on dorsal palpation of nailbeds 1-5 b/l.  Porokeratotic lesions submet head 4 left foot with tenderness to palpation. No erythema, no edema, no drainage, no flocculence.  Musculoskeletal Examination: Muscle strength 5/5 to all muscle groups b/l.  No pain, crepitus or joint discomfort with active/passive ROM.  Neurological Examination: Sensation decreased b/l with 10 gram monofilament.  Assessment: Mycotic nail infection with pain 1-5 b/l Porokeratotic lesion submet head 4 left foot NIDDM with neuropathy  Plan: 1. Toenails 1-5 b/l were debrided in length and girth without iatrogenic laceration. 2. Porokeratosis submet head 4 left foot pared and enucleated with sterile scalpel blade without incident. 3. Continue soft, supportive shoe gear daily. 4. Report any pedal injuries to medical professional. 5. Follow up 3 months. 6. Patient/POA to call should there be a question/concern in there  interim.

## 2019-11-01 ENCOUNTER — Telehealth: Payer: Self-pay

## 2019-11-01 ENCOUNTER — Other Ambulatory Visit: Payer: Self-pay | Admitting: Internal Medicine

## 2019-11-01 DIAGNOSIS — N3000 Acute cystitis without hematuria: Secondary | ICD-10-CM

## 2019-11-01 MED ORDER — CIPROFLOXACIN HCL 250 MG PO TABS: 250.0000 mg | ORAL_TABLET | Freq: Two times a day (BID) | ORAL | 0 refills | Status: DC

## 2019-11-01 NOTE — Telephone Encounter (Signed)
Copied from [place] [phone]. Topic: General - Other >> Nov 01, 2019 11:57 AM Jodie Echevaria wrote: Reason for CRM: Patient daughter [name] called to say that patient is still having some burning when she urinates. Would like to know if Dr Quay Burow can send some more antibiotics to the pharmacy or something else to help with this issue.  Ph#  [phone]

## 2019-11-01 NOTE — Telephone Encounter (Signed)
Antibiotic RX sent to pharmacy  TJ

## 2019-11-01 NOTE — Telephone Encounter (Signed)
Spoke with daughter today 

## 2019-12-07 ENCOUNTER — Other Ambulatory Visit: Payer: Self-pay | Admitting: Physician Assistant

## 2019-12-07 ENCOUNTER — Other Ambulatory Visit: Payer: Self-pay | Admitting: Cardiology

## 2019-12-07 DIAGNOSIS — E7800 Pure hypercholesterolemia, unspecified: Secondary | ICD-10-CM

## 2019-12-07 DIAGNOSIS — I2510 Atherosclerotic heart disease of native coronary artery without angina pectoris: Secondary | ICD-10-CM

## 2019-12-07 DIAGNOSIS — I5043 Acute on chronic combined systolic (congestive) and diastolic (congestive) heart failure: Secondary | ICD-10-CM

## 2019-12-07 DIAGNOSIS — I10 Essential (primary) hypertension: Secondary | ICD-10-CM

## 2019-12-12 ENCOUNTER — Encounter: Payer: Self-pay | Admitting: Physician Assistant

## 2019-12-12 ENCOUNTER — Other Ambulatory Visit: Payer: Self-pay | Admitting: Internal Medicine

## 2019-12-12 NOTE — Progress Notes (Signed)
Cardiology Office Note    Date:  12/13/2019   ID:  Jessica [name], DOB [birth year], MRN [id number]  PCP:  Binnie Rail, MD  Cardiologist:  Ena Dawley, MD  Electrophysiologist:  None   Chief Complaint: f/u CHF, CAD, atrial fib  History of Present Illness:   Jessica [name] is a 86 y.o. female with history of CAD (DESx2 to mid and distal RCA in 2010), chronic combined CHF, hypertensive heart disease, DM with peripheral neuropathy, HTN, HLD, SSS s/p Medtronic pacemaker 2011, permanent atrial fib (discovered on device interrogation 10/2014), CKD stage III, moderate pulm HTN who presents for follow-up.  She was hospitalized for acute diastolic CHF in 07/3569 in the setting of accelerated HTN and PAF. She underwent DCCV at that time. She was managed at the end of 2017 with worsening CHF in the setting of previous noncompliance with fluid restriction (up to 4L of water per day). Over the last few years she has progressed to permanent atrial fibrillation. There has been prior issue trying to find a balance between euvolemia and renal function/blood pressure. She was admitted in 10/2017 for acute on chronic combined CHF with new cardiomyopathy with EF 30-35%, moderate diffuse hypokinesis, mild aortic insufficiency, mild mitral regurgitation, mildly increased PASP. The patient and daughter wished for conservative management and declined cath. Last labs personally reviewed from 10/2019 showed Hgb 13.8, K 4.4, Cr 1.10, LFTs wnl, A1c 7.0, LDL 73, TSH wnl.  She presents for routine follow-up feeling well. Daughter [name] is with her as usual. No SOB, edema, palpitations, chest pain, dizziness or syncope. She does get occasional bendopnea but also reports chronically rounded lower abdomen. This looks stable from prior visits. She has noticed her BP has been persistently elevated. It was 170/88 at PCP office in 10/2019. She denies any low readings at home. She has been compliant with fluid  restriction.   Past Medical History:  Diagnosis Date  . Breast cancer ([place])   . CAD (coronary artery disease)    a. s/p DESx2 to mid and distal RCA in 2010.  Marland Kitchen Cholelithiasis 09/12/2013   Lap Chole on 09/14/13   . Chronic combined systolic and diastolic CHF (congestive heart failure) ([place])    a. Echo 06/09/15: EF 55-60% -> 10/2017 EF 30-35% (pt and daughter did not wish to proceed with ischemic assessment - conservative rx).  . CKD (chronic kidney disease), stage III   . Diabetes mellitus    NON INSULIN DEPENDENT  . DJD (degenerative joint disease)   . GERD (gastroesophageal reflux disease)   . Hemorrhoids   . History of diabetic neuropathy   . Hypercholesterolemia   . Hypertension   . Hypertensive heart disease   . Permanent atrial fibrillation (H[facility])    a. discovered on PPM interrogation 12/15- > eventually progressed to 100% atrial fib burden/permanent atrial fib.  . SSS (sick sinus syndrome) ([place])    a. s/p Medtronic PPM by JA for SSS and syncope 9/11.    Past Surgical History:  Procedure Laterality Date  . CARDIAC CATHETERIZATION  07/05/2009   EF 55-60%  . CARDIOVERSION N/A 06/11/2015   Procedure: CARDIOVERSION;  Surgeon: [name], MD;  Location: [facility] ENDOSCOPY;  Service: Cardiovascular;  Laterality: N/A;  . CHOLECYSTECTOMY N/A 09/14/2013   Procedure: LAPAROSCOPIC CHOLECYSTECTOMY WITH INTRAOPERATIVE CHOLANGIOGRAM;  Surgeon: [name], MD;  Location: [place];  Service: General;  Laterality: N/A;  . COLONOSCOPY    . ERCP N/A 09/13/2013   Procedure: ENDOSCOPIC RETROGRADE CHOLANGIOPANCREATOGRAPHY (ERCP);  Surgeon: [name], MD;  Location: [facility] ENDOSCOPY;  Service: Endoscopy;  Laterality: N/A;  . INSERT / REPLACE / REMOVE PACEMAKER  9/11   SSS and syncope, implanted by JA (MDT)  . MASTECTOMY  1992   BILATERAL WITH RECONSTRUCTION    Current Medications: Current Meds  Medication Sig  . b complex vitamins tablet Take 1 tablet by mouth daily.  . blood glucose  meter kit and supplies KIT Dispense based on patient and insurance preference. Test sugars once daily and prn as directed. (FOR ICD-9 250.00, 250.01).  . ciprofloxacin (CIPRO) 250 MG tablet Take 1 tablet (250 mg total) by mouth 2 (two) times daily.  Marland Kitchen docusate sodium (COLACE) 100 MG capsule Take 100 mg by mouth at bedtime.  Marland Kitchen ECHINACEA PO Take 750 mg by mouth daily.  Marland Kitchen ELIQUIS 5 MG TABS tablet Take 1 tablet by mouth twice daily  . fish oil-omega-3 fatty acids 1000 MG capsule Take 1 g by mouth every morning.   . furosemide (LASIX) 20 MG tablet TAKE 2 TABLETS BY MOUTH EVERY MORNING AND 1 TABLET EVERY AFTERNOON  . gabapentin (NEURONTIN) 100 MG capsule TAKE 1 CAPSULE BY MOUTH NIGHTLY TAKE  IN  ADDITION  TO  300  MG  CAPSULE  AT  NIGHT  . gabapentin (NEURONTIN) 300 MG capsule Take 300 mg by mouth 2 (two) times daily.  . Misc. Devices (ROLLATOR ULTRA-LIGHT) MISC With seat.  Diabetic neuropathy, knee arthritis  . nitroGLYCERIN (NITROSTAT) 0.4 MG SL tablet Place 1 tablet (0.4 mg total) under the tongue every 5 (five) minutes as needed for chest pain. x3 doses as needed for chest pain  . potassium chloride SA (KLOR-CON) 20 MEQ tablet TAKE 1  BY MOUTH ONCE DAILY  . rosuvastatin (CRESTOR) 5 MG tablet Take 1 tablet (5 mg total) by mouth daily at 6 PM.  .  carvedilol (COREG) 12.5 MG tablet Take 1 tablet (12.5 mg total) by mouth 2 (two) times daily with a meal.  .  lisinopril (ZESTRIL) 5 MG tablet Take 1.5 tablets (7.5 mg total) by mouth daily.      Allergies:   Lyrica [pregabalin], Amlodipine, and Sulfonamide derivatives   Social History   Socioeconomic History  . Marital status: Widowed    Spouse name: Not on file  . Number of children: Not on file  . Years of education: Not on file  . Highest education level: Not on file  Occupational History  . Not on file  Tobacco Use  . Smoking status: Never Smoker  . Smokeless tobacco: Never Used  Substance and Sexual Activity  . Alcohol use: No     Alcohol/week: 0.0 standard drinks  . Drug use: No  . Sexual activity: Not on file  Other Topics Concern  . Not on file  Social History Narrative  . Not on file   Social Determinants of Health   Financial Resource Strain:   . Difficulty of Paying Living Expenses: Not on file  Food Insecurity:   . Worried About Charity fundraiser in the Last Year: Not on file  . Ran Out of Food in the Last Year: Not on file  Transportation Needs:   . Lack of Transportation (Medical): Not on file  . Lack of Transportation (Non-Medical): Not on file  Physical Activity:   . Days of Exercise per Week: Not on file  . Minutes of Exercise per Session: Not on file  Stress:   . Feeling of Stress : Not on file  Social Connections:   . Frequency of Communication with Friends and Family: Not on file  . Frequency of Social Gatherings with Friends and Family: Not on file  . Attends Religious Services: Not on file  . Active Member of Clubs or Organizations: Not on file  . Attends Archivist Meetings: Not on file  . Marital Status: Not on file     Family History:  The patient's family history includes Cancer in her father and mother.  ROS:   Please see the history of present illness. Otherwise, review of systems is positive for .  All other systems are reviewed and otherwise negative.    EKGs/Labs/Other Studies Reviewed:    Studies reviewed are outlined and summarized above. Reports included below if pertinent.  2D echo 10/2017 - Procedure narrative: Transthoracic echocardiography. Image  quality was poor. The study was technically difficult, as a  result of poor acoustic windows, poor sound wave transmission,  chest wall deformity, and body habitus.  - Left ventricle: The cavity size was normal. Wall thickness was  increased in a pattern of moderate LVH. Systolic function was  moderately to severely reduced. The estimated ejection fraction  was in the range of 30% to 35%.  Moderate diffuse hypokinesis.  - Aortic valve: There was mild regurgitation.  - Mitral valve: Mildly calcified annulus. There was mild  regurgitation.  - Pulmonary arteries: Systolic pressure was mildly increased. PA  peak pressure: 40 mm Hg (S).     EKG:  EKG is ordered today, personally reviewed, demonstrating v paced rhythm 66bpm suspect underlying atrial fib as previously known   Recent Labs: 10/19/2019: ALT 16; BUN 26; Creatinine, Ser 1.10; Hemoglobin 13.8; Platelets 271.0; Potassium 4.4; Sodium 141; TSH 4.47  Recent Lipid Panel    Component Value Date/Time   CHOL 129 10/19/2019 1540   TRIG 75.0 10/19/2019 1540   HDL 41.60 10/19/2019 1540   CHOLHDL 3 10/19/2019 1540   VLDL 15.0 10/19/2019 1540   LDLCALC 73 10/19/2019 1540   LDLDIRECT 152.0 10/10/2013 1619    PHYSICAL EXAM:    VS:  BP (!) 158/74   Pulse 66   Ht 5' 3"  (1.6 m)   Wt 163 lb (73.9 kg)   LMP  (LMP Unknown)   SpO2 98%   BMI 28.87 kg/m   BMI: Body mass index is 28.87 kg/m.  GEN: Well nourished, well developed elderly WF, in no acute distress HEENT: normocephalic, atraumatic Neck: no JVD, carotid bruits, or masses Cardiac: RRR; no murmurs, rubs, or gallops, no edema  Respiratory:  clear to auscultation bilaterally, normal work of breathing GI: rounded but soft, + BS x4, nontender, no obvious masses MS: no deformity or atrophy, kyphotic posture Skin: warm and dry, no rash Neuro:  Alert and Oriented x 3, Strength and sensation are intact, follows commands Psych: euthymic mood, full affect  Wt Readings from Last 3 Encounters:  12/13/19 163 lb (73.9 kg)  10/19/19 161 lb 12.8 oz (73.4 kg)  09/21/19 160 lb (72.6 kg)     ASSESSMENT & PLAN:   1. CAD - no recent angina. Not on ASA due to concomitant Eliquis with advanced age. Titrate carvedilol as below for risk factor modification. Continue statin as tolerated. 2. Chronic combined CHF with mild AI/MR - appears euvolemic. She historically has a rounded  abdomen which feels similar to prior visits. No lower extremity edema or rales. Weight has been stable at home. She is now compliant with fluid restriction. Labs 10/2019 were stable. Will  titrate BB as below but otherwise continue present regimen. Given clinical stability I would otherwise be hesitant to rock the boat. 3. Essential HTN - uncontrolled. Will titrate carvedilol to 66m BID. I asked her to check her blood pressure daily at 2pm and report back with readings in a week. I offered virtual visit but they would prefer just to report BPs which is reasonable. 4. HLD - she has been maintained on low dose Crestor by Dr. NMeda Coffeewith LDL near goal in 10/2019. Would continue present regimen. 5. Permanent atrial fibrillation - rate controlled, tolerating Eliquis with normal Hgb and stable Cr in 10/2019.  Disposition: F/u with Dr. NMeda Coffeein 6 months.  Medication Adjustments/Labs and Tests Ordered: Current medicines are reviewed at length with the patient today.  Concerns regarding medicines are outlined above. Medication changes, Labs and Tests ordered today are summarized above and listed in the Patient Instructions accessible in Encounters.   Signed, DCharlie Pitter PA-C  12/13/2019 3:19 PM    [facility] [address] [place]  [id number]: [phone] Fax: [phone]

## 2019-12-13 ENCOUNTER — Ambulatory Visit: Payer: Medicare Other | Admitting: Physician Assistant

## 2019-12-13 ENCOUNTER — Encounter (INDEPENDENT_AMBULATORY_CARE_PROVIDER_SITE_OTHER): Payer: Self-pay

## 2019-12-13 ENCOUNTER — Other Ambulatory Visit: Payer: Self-pay

## 2019-12-13 ENCOUNTER — Encounter: Payer: Self-pay | Admitting: Physician Assistant

## 2019-12-13 VITALS — BP 158/74 | HR 66 | Ht 63.0 in | Wt 163.0 lb

## 2019-12-13 DIAGNOSIS — I5042 Chronic combined systolic (congestive) and diastolic (congestive) heart failure: Secondary | ICD-10-CM | POA: Diagnosis not present

## 2019-12-13 DIAGNOSIS — E785 Hyperlipidemia, unspecified: Secondary | ICD-10-CM | POA: Diagnosis not present

## 2019-12-13 DIAGNOSIS — I2510 Atherosclerotic heart disease of native coronary artery without angina pectoris: Secondary | ICD-10-CM | POA: Diagnosis not present

## 2019-12-13 DIAGNOSIS — I10 Essential (primary) hypertension: Secondary | ICD-10-CM

## 2019-12-13 DIAGNOSIS — I4821 Permanent atrial fibrillation: Secondary | ICD-10-CM

## 2019-12-13 MED ORDER — LISINOPRIL 5 MG PO TABS: 7.5000 mg | ORAL_TABLET | Freq: Every day | ORAL | 3 refills | Status: DC

## 2019-12-13 MED ORDER — CARVEDILOL 25 MG PO TABS: 25.0000 mg | ORAL_TABLET | Freq: Two times a day (BID) | ORAL | 3 refills | Status: DC

## 2019-12-13 NOTE — Patient Instructions (Signed)
Medication Instructions:  Your physician has recommended you make the following change in your medication:  1.  INCREASE the Carvedilol to 25 mg taking 1 tablet twice a day.  You may take 2 of the 12.5 mg tablets twice a day to use them up.  *If you need a refill on your cardiac medications before your next appointment, please call your pharmacy*  Lab Work: None ordered  If you have labs (blood work) drawn today and your tests are completely normal, you will receive your results only by: Marland Kitchen MyChart Message (if you have MyChart) OR . A paper copy in the mail If you have any lab test that is abnormal or we need to change your treatment, we will call you to review the results.  Testing/Procedures: None ordered  Follow-Up: At [facility], you and your health needs are our priority.  As part of our continuing mission to provide you with exceptional heart care, we have created designated Provider Care Teams.  These Care Teams include your primary Cardiologist (physician) and Advanced Practice Providers (APPs -  Physician Assistants and Nurse Practitioners) who all work together to provide you with the care you need, when you need it.  Your next appointment:   6 month(s)  The format for your next appointment:   In Person  Provider:   You may see Ena Dawley, MD or one of the following Advanced Practice Providers on your designated Care Team:    [name], PA-C  Ermalinda Barrios, PA-C   Other Instructions Check your blood pressure 2:00 p.m daily and call in the results in after a week

## 2020-01-11 ENCOUNTER — Ambulatory Visit (INDEPENDENT_AMBULATORY_CARE_PROVIDER_SITE_OTHER): Payer: Medicare Other | Admitting: *Deleted

## 2020-01-11 DIAGNOSIS — Z950 Presence of cardiac pacemaker: Secondary | ICD-10-CM | POA: Diagnosis not present

## 2020-01-11 LAB — CUP PACEART REMOTE DEVICE CHECK
Battery Impedance: 1402 Ohm
Battery Remaining Longevity: 47 mo
Battery Voltage: 2.76 V
Brady Statistic AP VP Percent: 6 %
Brady Statistic AP VS Percent: 1 %
Brady Statistic AS VP Percent: 7 %
Brady Statistic AS VS Percent: 87 %
Date Time Interrogation Session: 20210310150027
Implantable Lead Implant Date: 20110912
Implantable Lead Implant Date: 20110912
Implantable Lead Location: 753859
Implantable Lead Location: 753860
Implantable Lead Model: 5076
Implantable Lead Model: 5092
Implantable Pulse Generator Implant Date: 20110912
Lead Channel Impedance Value: 502 Ohm
Lead Channel Impedance Value: 652 Ohm
Lead Channel Pacing Threshold Amplitude: 0.5 V
Lead Channel Pacing Threshold Amplitude: 0.75 V
Lead Channel Pacing Threshold Pulse Width: 0.4 ms
Lead Channel Pacing Threshold Pulse Width: 0.4 ms
Lead Channel Setting Pacing Amplitude: 2 V
Lead Channel Setting Pacing Amplitude: 2.5 V
Lead Channel Setting Pacing Pulse Width: 0.4 ms
Lead Channel Setting Sensing Sensitivity: 2 mV

## 2020-01-12 NOTE — Progress Notes (Signed)
PPM Remote  

## 2020-01-16 ENCOUNTER — Other Ambulatory Visit: Payer: Self-pay

## 2020-01-16 ENCOUNTER — Ambulatory Visit: Payer: Medicare Other | Admitting: Podiatry

## 2020-01-16 ENCOUNTER — Encounter: Payer: Self-pay | Admitting: Podiatry

## 2020-01-16 VITALS — Temp 95.8°F

## 2020-01-16 DIAGNOSIS — Q828 Other specified congenital malformations of skin: Secondary | ICD-10-CM | POA: Diagnosis not present

## 2020-01-16 DIAGNOSIS — E1142 Type 2 diabetes mellitus with diabetic polyneuropathy: Secondary | ICD-10-CM

## 2020-01-16 DIAGNOSIS — M79676 Pain in unspecified toe(s): Secondary | ICD-10-CM

## 2020-01-16 DIAGNOSIS — B351 Tinea unguium: Secondary | ICD-10-CM

## 2020-01-16 NOTE — Patient Instructions (Signed)
Diabetes Mellitus and Foot Care Foot care is an important part of your health, especially when you have diabetes. Diabetes may cause you to have problems because of poor blood flow (circulation) to your feet and legs, which can cause your skin to:  Become thinner and drier.  Break more easily.  Heal more slowly.  Peel and crack. You may also have nerve damage (neuropathy) in your legs and feet, causing decreased feeling in them. This means that you may not notice minor injuries to your feet that could lead to more serious problems. Noticing and addressing any potential problems early is the best way to prevent future foot problems. How to care for your feet Foot hygiene  Wash your feet daily with warm water and mild soap. Do not use hot water. Then, pat your feet and the areas between your toes until they are completely dry. Do not soak your feet as this can dry your skin.  Trim your toenails straight across. Do not dig under them or around the cuticle. File the edges of your nails with an emery board or nail file.  Apply a moisturizing lotion or petroleum jelly to the skin on your feet and to dry, brittle toenails. Use lotion that does not contain alcohol and is unscented. Do not apply lotion between your toes. Shoes and socks  Wear clean socks or stockings every day. Make sure they are not too tight. Do not wear knee-high stockings since they may decrease blood flow to your legs.  Wear shoes that fit properly and have enough cushioning. Always look in your shoes before you put them on to be sure there are no objects inside.  To break in new shoes, wear them for just a few hours a day. This prevents injuries on your feet. Wounds, scrapes, corns, and calluses  Check your feet daily for blisters, cuts, bruises, sores, and redness. If you cannot see the bottom of your feet, use a mirror or ask someone for help.  Do not cut corns or calluses or try to remove them with medicine.  If you  find a minor scrape, cut, or break in the skin on your feet, keep it and the skin around it clean and dry. You may clean these areas with mild soap and water. Do not clean the area with peroxide, alcohol, or iodine.  If you have a wound, scrape, corn, or callus on your foot, look at it several times a day to make sure it is healing and not infected. Check for: ? Redness, swelling, or pain. ? Fluid or blood. ? Warmth. ? Pus or a bad smell. General instructions  Do not cross your legs. This may decrease blood flow to your feet.  Do not use heating pads or hot water bottles on your feet. They may burn your skin. If you have lost feeling in your feet or legs, you may not know this is happening until it is too late.  Protect your feet from hot and cold by wearing shoes, such as at the beach or on hot pavement.  Schedule a complete foot exam at least once a year (annually) or more often if you have foot problems. If you have foot problems, report any cuts, sores, or bruises to your health care provider immediately. Contact a health care provider if:  You have a medical condition that increases your risk of infection and you have any cuts, sores, or bruises on your feet.  You have an injury that is not   healing.  You have redness on your legs or feet.  You feel burning or tingling in your legs or feet.  You have pain or cramps in your legs and feet.  Your legs or feet are numb.  Your feet always feel cold.  You have pain around a toenail. Get help right away if:  You have a wound, scrape, corn, or callus on your foot and: ? You have pain, swelling, or redness that gets worse. ? You have fluid or blood coming from the wound, scrape, corn, or callus. ? Your wound, scrape, corn, or callus feels warm to the touch. ? You have pus or a bad smell coming from the wound, scrape, corn, or callus. ? You have a fever. ? You have a red line going up your leg. Summary  Check your feet every day  for cuts, sores, red spots, swelling, and blisters.  Moisturize feet and legs daily.  Wear shoes that fit properly and have enough cushioning.  If you have foot problems, report any cuts, sores, or bruises to your health care provider immediately.  Schedule a complete foot exam at least once a year (annually) or more often if you have foot problems. This information is not intended to replace advice given to you by your health care provider. Make sure you discuss any questions you have with your health care provider. Document Revised: 07/13/2019 Document Reviewed: 11/21/2016 Elsevier Patient Education  2020 Elsevier Inc.  Corns and Calluses Corns are small areas of thickened skin that occur on the top, sides, or tip of a toe. They contain a cone-shaped core with a point that can press on a nerve below. This causes pain.  Calluses are areas of thickened skin that can occur anywhere on the body, including the hands, fingers, palms, soles of the feet, and heels. Calluses are usually larger than corns. What are the causes? Corns and calluses are caused by rubbing (friction) or pressure, such as from shoes that are too tight or do not fit properly. What increases the risk? Corns are more likely to develop in people who have misshapen toes (toe deformities), such as hammer toes. Calluses can occur with friction to any area of the skin. They are more likely to develop in people who:  Work with their hands.  Wear shoes that fit poorly, are too tight, or are high-heeled.  Have toe deformities. What are the signs or symptoms? Symptoms of a corn or callus include:  A hard growth on the skin.  Pain or tenderness under the skin.  Redness and swelling.  Increased discomfort while wearing tight-fitting shoes, if your feet are affected. If a corn or callus becomes infected, symptoms may include:  Redness and swelling that gets worse.  Pain.  Fluid, blood, or pus draining from the corn or  callus. How is this diagnosed? Corns and calluses may be diagnosed based on your symptoms, your medical history, and a physical exam. How is this treated? Treatment for corns and calluses may include:  Removing the cause of the friction or pressure. This may involve: ? Changing your shoes. ? Wearing shoe inserts (orthotics) or other protective layers in your shoes, such as a corn pad. ? Wearing gloves.  Applying medicine to the skin (topical medicine) to help soften skin in the hardened, thickened areas.  Removing layers of dead skin with a file to reduce the size of the corn or callus.  Removing the corn or callus with a scalpel or laser.  Taking   antibiotic medicines, if your corn or callus is infected.  Having surgery, if a toe deformity is the cause. Follow these instructions at home:   Take over-the-counter and prescription medicines only as told by your health care provider.  If you were prescribed an antibiotic, take it as told by your health care provider. Do not stop taking it even if your condition starts to improve.  Wear shoes that fit well. Avoid wearing high-heeled shoes and shoes that are too tight or too loose.  Wear any padding, protective layers, gloves, or orthotics as told by your health care provider.  Soak your hands or feet and then use a file or pumice stone to soften your corn or callus. Do this as told by your health care provider.  Check your corn or callus every day for symptoms of infection. Contact a health care provider if you:  Notice that your symptoms do not improve with treatment.  Have redness or swelling that gets worse.  Notice that your corn or callus becomes painful.  Have fluid, blood, or pus coming from your corn or callus.  Have new symptoms. Summary  Corns are small areas of thickened skin that occur on the top, sides, or tip of a toe.  Calluses are areas of thickened skin that can occur anywhere on the body, including the  hands, fingers, palms, and soles of the feet. Calluses are usually larger than corns.  Corns and calluses are caused by rubbing (friction) or pressure, such as from shoes that are too tight or do not fit properly.  Treatment may include wearing any padding, protective layers, gloves, or orthotics as told by your health care provider. This information is not intended to replace advice given to you by your health care provider. Make sure you discuss any questions you have with your health care provider. Document Revised: 02/09/2019 Document Reviewed: 09/02/2017 Elsevier Patient Education  2020 Elsevier Inc.  

## 2020-01-22 NOTE — Progress Notes (Signed)
Subjective: KAILIE KUIPERS presents today for follow up of preventative diabetic foot care and painful porokeratotic lesion(s) b/l feet and painful mycotic toenails b/l that limit ambulation. Aggravating factors include weightbearing with and without shoe gear. Pain for both is relieved with periodic professional debridement.   She voices no new pedal problems on today's visit.  Allergies  Allergen Reactions  . Lyrica [Pregabalin] Swelling    Caused weight gain  . Amlodipine Swelling  . Sulfonamide Derivatives Rash     Objective: Vitals:   01/16/20 1509  Temp: (!) 95.8 F (35.4 C)    Pt 86 y.o. year old female  in NAD. AAO x 3.   Vascular Examination:  Capillary fill time to digits <3 seconds b/l. Palpable DP pulses b/l. Palpable PT pulses b/l. Pedal hair absent b/l Skin temperature gradient within normal limits b/l.  Dermatological Examination: Pedal skin with normal turgor, texture and tone bilaterally. No open wounds bilaterally. No interdigital macerations bilaterally. Toenails 1-5 b/l elongated, dystrophic, thickened, crumbly with subungual debris and tenderness to dorsal palpation. Porokeratotic lesion(s) distal tip right 4th digit and submet head 4 left foot. No erythema, no edema, no drainage, no flocculence.  Musculoskeletal: Normal muscle strength 5/5 to all lower extremity muscle groups bilaterally, no pain crepitus or joint limitation noted with ROM b/l and hammertoes noted to the  2-5 bilaterally  Neurological: Protective sensation decreased with 10 gram monofilament b/l Vibratory sensation decreased b/l  Assessment: 1. Pain due to onychomycosis of toenail   2. Porokeratosis   3. Diabetic peripheral neuropathy associated with type 2 diabetes mellitus ([place])    Plan: -Continue diabetic foot care principles. Literature dispensed on today.  -Toenails 1-5 b/l were debrided in length and girth with sterile nail nippers and dremel without iatrogenic bleeding.  -Painful  porokeratotic lesion(s) submet head 4 left and distal tip right 4th digit pared and enucleated with sterile scalpel blade without incident. -Patient to continue soft, supportive shoe gear daily. -Patient to report any pedal injuries to medical professional immediately. -Patient/POA to call should there be question/concern in the interim.  Return in about 3 months (around 04/17/2020) for diabetic nail and callus trim.

## 2020-01-23 ENCOUNTER — Other Ambulatory Visit: Payer: Self-pay | Admitting: Internal Medicine

## 2020-02-06 LAB — HM DIABETES EYE EXAM

## 2020-02-08 ENCOUNTER — Encounter: Payer: Self-pay | Admitting: Internal Medicine

## 2020-03-06 ENCOUNTER — Other Ambulatory Visit: Payer: Self-pay | Admitting: Cardiology

## 2020-03-08 NOTE — Progress Notes (Signed)
Subjective:    Patient ID: Jessica [name], female    DOB: 21-Nov-1926, 86 y.o.   MRN: [id number]  HPI The patient is here for an acute visit for not feeling well.  She is here with her daughter.   Stomach bloating: She has chronic stomach bloating-she states it just feels distended.  It is worse in the evening than in the morning.  She does have chronic constipation.  She takes 1 stool softener/laxative nightly.  She does not go every day and does not always feel that she has a complete bowel movement.  She denies any abdominal pain, blood in the stool or nausea.  She has been experiencing pain with urination-she states this is chronic, but has gotten worse in the past few days.  She is going to the bathroom more frequently.  She is incontinent and wears depends.  She has not seen any blood in the stool.  She denies any fevers.  Her appetite is still good.  She denies cold symptoms.    Medications and allergies reviewed with patient and updated if appropriate.  Patient Active Problem List   Diagnosis Date Noted  . Dysuria 10/19/2019  . H/O bilateral mastectomy 10/13/2018  . HX: breast cancer 10/13/2018  . Permanent atrial fibrillation ([place]) 10/31/2017  . Cardiac pacemaker in situ   . Chronic combined systolic and diastolic CHF (congestive heart failure) ([place]) 10/12/2017  . Bilateral leg edema 04/29/2016  . CKD (chronic kidney disease) stage 3, GFR 30-59 ml/min (HCC) 06/10/2015  . CAD (coronary artery disease) 06/09/2015  . Diabetic neuropathy ([place]) 07/06/2013  . SSS (sick sinus syndrome) ([place]) 02/10/2013  . Hypertension 07/30/2012  . Pacemaker-Medtronic 07/19/2012  . Benign hypertensive heart disease with heart failure ([place]) 01/28/2012  . Diabetes ([place]) 10/22/2010  . HYPERCHOLESTEROLEMIA 10/22/2010  . DEGENERATIVE JOINT DISEASE 10/22/2010    Current Outpatient Medications on File Prior to Visit  Medication Sig Dispense Refill  . b complex vitamins tablet Take 1 tablet by  mouth daily.    . blood glucose meter kit and supplies KIT Dispense based on patient and insurance preference. Test sugars once daily and prn as directed. (FOR ICD-9 250.00, 250.01). 1 each 0  . carvedilol (COREG) 25 MG tablet Take 1 tablet (25 mg total) by mouth 2 (two) times daily. 180 tablet 3  . docusate sodium (COLACE) 100 MG capsule Take 100 mg by mouth at bedtime.    Marland Kitchen ECHINACEA PO Take 750 mg by mouth daily.    Marland Kitchen ELIQUIS 5 MG TABS tablet Take 1 tablet by mouth twice daily 180 tablet 1  . fish oil-omega-3 fatty acids 1000 MG capsule Take 1 g by mouth every morning.     . furosemide (LASIX) 20 MG tablet TAKE 2 TABLETS BY MOUTH EVERY MORNING AND 1 TABLET EVERY AFTERNOON 270 tablet 2  . gabapentin (NEURONTIN) 100 MG capsule TAKE 1 CAPSULE BY MOUTH NIGHTLY TAKE  IN  ADDITION  TO  300  MG  CAPSULE  AT  NIGHT 90 capsule 0  . gabapentin (NEURONTIN) 300 MG capsule Take 300 mg by mouth at bedtime.     Marland Kitchen lisinopril (ZESTRIL) 5 MG tablet Take 1.5 tablets (7.5 mg total) by mouth daily. 135 tablet 3  . Misc. Devices (ROLLATOR ULTRA-LIGHT) MISC With seat.  Diabetic neuropathy, knee arthritis 1 each 0  . nitroGLYCERIN (NITROSTAT) 0.4 MG SL tablet Place 1 tablet (0.4 mg total) under the tongue every 5 (five) minutes as needed for chest pain. x3  doses as needed for chest pain 25 tablet 0  . potassium chloride SA (KLOR-CON) 20 MEQ tablet TAKE 1  BY MOUTH ONCE DAILY 90 tablet 0  . rosuvastatin (CRESTOR) 5 MG tablet Take 1 tablet (5 mg total) by mouth daily at 6 PM. 90 tablet 1   No current facility-administered medications on file prior to visit.    Past Medical History:  Diagnosis Date  . Breast cancer ([place])   . CAD (coronary artery disease)    a. s/p DESx2 to mid and distal RCA in 2010.  Marland Kitchen Cholelithiasis 09/12/2013   Lap Chole on 09/14/13   . Chronic combined systolic and diastolic CHF (congestive heart failure) ([place])    a. Echo 06/09/15: EF 55-60% -> 10/2017 EF 30-35% (pt and daughter did not wish to  proceed with ischemic assessment - conservative rx).  . CKD (chronic kidney disease), stage III   . Diabetes mellitus    NON INSULIN DEPENDENT  . DJD (degenerative joint disease)   . GERD (gastroesophageal reflux disease)   . Hemorrhoids   . History of diabetic neuropathy   . Hypercholesterolemia   . Hypertension   . Hypertensive heart disease   . Permanent atrial fibrillation (HCC)    a. discovered on PPM interrogation 12/15- > eventually progressed to 100% atrial fib burden/permanent atrial fib.  . SSS (sick sinus syndrome) ([place])    a. s/p Medtronic PPM by JA for SSS and syncope 9/11.    Past Surgical History:  Procedure Laterality Date  . CARDIAC CATHETERIZATION  07/05/2009   EF 55-60%  . CARDIOVERSION N/A 06/11/2015   Procedure: CARDIOVERSION;  Surgeon: [name], MD;  Location: [facility] ENDOSCOPY;  Service: Cardiovascular;  Laterality: N/A;  . CHOLECYSTECTOMY N/A 09/14/2013   Procedure: LAPAROSCOPIC CHOLECYSTECTOMY WITH INTRAOPERATIVE CHOLANGIOGRAM;  Surgeon: [name], MD;  Location: [facility];  Service: General;  Laterality: N/A;  . COLONOSCOPY    . ERCP N/A 09/13/2013   Procedure: ENDOSCOPIC RETROGRADE CHOLANGIOPANCREATOGRAPHY (ERCP);  Surgeon: [name], MD;  Location: [facility] ENDOSCOPY;  Service: Endoscopy;  Laterality: N/A;  . INSERT / REPLACE / REMOVE PACEMAKER  9/11   SSS and syncope, implanted by JA (MDT)  . MASTECTOMY  1992   BILATERAL WITH RECONSTRUCTION    Social History   Socioeconomic History  . Marital status: Widowed    Spouse name: Not on file  . Number of children: Not on file  . Years of education: Not on file  . Highest education level: Not on file  Occupational History  . Not on file  Tobacco Use  . Smoking status: Never Smoker  . Smokeless tobacco: Never Used  Substance and Sexual Activity  . Alcohol use: No    Alcohol/week: 0.0 standard drinks  . Drug use: No  . Sexual activity: Not on file  Other Topics Concern  . Not on file    Social History Narrative  . Not on file   Social Determinants of Health   Financial Resource Strain:   . Difficulty of Paying Living Expenses:   Food Insecurity:   . Worried About Charity fundraiser in the Last Year:   . Arboriculturist in the Last Year:   Transportation Needs:   . Film/video editor (Medical):   Marland Kitchen Lack of Transportation (Non-Medical):   Physical Activity:   . Days of Exercise per Week:   . Minutes of Exercise per Session:   Stress:   . Feeling of Stress :  Social Connections:   . Frequency of Communication with Friends and Family:   . Frequency of Social Gatherings with Friends and Family:   . Attends Religious Services:   . Active Member of Clubs or Organizations:   . Attends Archivist Meetings:   Marland Kitchen Marital Status:     Family History  Problem Relation Age of Onset  . Cancer Mother   . Cancer Father     Review of Systems  Constitutional: Negative for appetite change and fever.  Respiratory: Negative for cough and shortness of breath.   Cardiovascular: Positive for leg swelling (mild). Negative for chest pain and palpitations.  Gastrointestinal: Positive for abdominal distention (at night > in morning) and constipation. Negative for abdominal pain, blood in stool, diarrhea and nausea.  Genitourinary: Positive for dysuria (chronic, worse in last few days) and frequency. Negative for hematuria.       Objective:   Vitals:   03/09/20 1116  BP: (!) 144/86  Pulse: 69  Resp: 16  Temp: 98.2 F (36.8 C)  SpO2: 96%   BP Readings from Last 3 Encounters:  03/09/20 (!) 144/86  12/13/19 (!) 158/74  10/19/19 (!) 170/88   Wt Readings from Last 3 Encounters:  03/09/20 162 lb (73.5 kg)  12/13/19 163 lb (73.9 kg)  10/19/19 161 lb 12.8 oz (73.4 kg)   Body mass index is 28.7 kg/m.   Physical Exam    Constitutional: Appears well-developed and well-nourished. No distress.  Head: Normocephalic and atraumatic.  Cardiovascular: Normal  rate, regular rhythm and normal heart sounds. .  Mild bilateral lower extremity edema - LLE > RLE Pulmonary/Chest: Effort normal and breath sounds normal. No respiratory distress. No has no wheezes. No rales.  Abdomen: Soft, nontender, positive distention-diffuse Skin: Skin is warm and dry. Not diaphoretic.  Psychiatric: Normal mood and affect. Behavior is normal.       Assessment & Plan:    See Problem List for Assessment and Plan of chronic medical problems.     This visit occurred during the SARS-CoV-2 public health emergency.  Safety protocols were in place, including screening questions prior to the visit, additional usage of staff PPE, and extensive cleaning of exam room while observing appropriate contact time as indicated for disinfecting solutions.

## 2020-03-09 ENCOUNTER — Encounter: Payer: Self-pay | Admitting: Internal Medicine

## 2020-03-09 ENCOUNTER — Ambulatory Visit: Payer: Medicare Other | Admitting: Internal Medicine

## 2020-03-09 ENCOUNTER — Other Ambulatory Visit: Payer: Self-pay

## 2020-03-09 VITALS — BP 144/86 | HR 69 | Temp 98.2°F | Resp 16 | Ht 63.0 in | Wt 162.0 lb

## 2020-03-09 DIAGNOSIS — R140 Abdominal distension (gaseous): Secondary | ICD-10-CM | POA: Insufficient documentation

## 2020-03-09 DIAGNOSIS — N3000 Acute cystitis without hematuria: Secondary | ICD-10-CM | POA: Diagnosis not present

## 2020-03-09 DIAGNOSIS — K5909 Other constipation: Secondary | ICD-10-CM | POA: Insufficient documentation

## 2020-03-09 DIAGNOSIS — R300 Dysuria: Secondary | ICD-10-CM

## 2020-03-09 LAB — POCT URINALYSIS DIPSTICK
Bilirubin, UA: NEGATIVE
Blood, UA: NEGATIVE
Glucose, UA: NEGATIVE
Ketones, UA: NEGATIVE
Nitrite, UA: NEGATIVE
Protein, UA: NEGATIVE
Spec Grav, UA: 1.015 (ref 1.010–1.025)
Urobilinogen, UA: NEGATIVE E.U./dL — AB
pH, UA: 6 (ref 5.0–8.0)

## 2020-03-09 MED ORDER — CIPROFLOXACIN HCL 250 MG PO TABS: 250.0000 mg | ORAL_TABLET | Freq: Two times a day (BID) | ORAL | 0 refills | Status: DC

## 2020-03-09 NOTE — Assessment & Plan Note (Signed)
New problem Chronic in nature Likely related to chronic constipation, but acute UTI may also be contributing Antibiotic to be started for UTI Advised increasing stool softener/laxative to 2 pills daily if okay according to package instructions Advise starting Metamucil daily-she does not like taking this, but stressed that it will dissolve and she will not know that she is taking it If there is no improvement in the abdominal bloating they should return

## 2020-03-09 NOTE — Assessment & Plan Note (Signed)
Chronic in nature Likely contributing to her abdominal distention Currently taking 1 stool softener/laxative nightly-advised increasing this if okay with package instructions Advised starting Metamucil daily Does not like MiraLAX and prunes have not worked Hopefully the above will make her more regular and improve her abdominal bloating Call if no improvement

## 2020-03-09 NOTE — Assessment & Plan Note (Signed)
Urine dip consistent with UTI Will send urine for culture Take the antibiotic as prescribed.   Take tylenol if needed.   Increase your water intake.  Call if no improvement   

## 2020-03-09 NOTE — Patient Instructions (Signed)
For your UTI - Take the antibiotic as prescribed.  Take tylenol if needed.  Increase your water intake.   Call if no improvement    Increase your stool softener to two pills if the package say you can take two.  Try taking metamucil fiber daily.     Urinary Tract Infection, Adult A urinary tract infection (UTI) is an infection of any part of the urinary tract, which includes the kidneys, ureters, bladder, and urethra. These organs make, store, and get rid of urine in the body. UTI can be a bladder infection (cystitis) or kidney infection (pyelonephritis). What are the causes? This infection may be caused by fungi, viruses, or bacteria. Bacteria are the most common cause of UTIs. This condition can also be caused by repeated incomplete emptying of the bladder during urination. What increases the risk? This condition is more likely to develop if:  You ignore your need to urinate or hold urine for long periods of time.  You do not empty your bladder completely during urination.  You wipe back to front after urinating or having a bowel movement, if you are female.  You are uncircumcised, if you are female.  You are constipated.  You have a urinary catheter that stays in place (indwelling).  You have a weak defense (immune) system.  You have a medical condition that affects your bowels, kidneys, or bladder.  You have diabetes.  You take antibiotic medicines frequently or for long periods of time, and the antibiotics no longer work well against certain types of infections (antibiotic resistance).  You take medicines that irritate your urinary tract.  You are exposed to chemicals that irritate your urinary tract.  You are female.  What are the signs or symptoms? Symptoms of this condition include:  Fever.  Frequent urination or passing small amounts of urine frequently.  Needing to urinate urgently.  Pain or burning with urination.  Urine that smells bad or  unusual.  Cloudy urine.  Pain in the lower abdomen or back.  Trouble urinating.  Blood in the urine.  Vomiting or being less hungry than normal.  Diarrhea or abdominal pain.  Vaginal discharge, if you are female.  How is this diagnosed? This condition is diagnosed with a medical history and physical exam. You will also need to provide a urine sample to test your urine. Other tests may be done, including:  Blood tests.  Sexually transmitted disease (STD) testing.  If you have had more than one UTI, a cystoscopy or imaging studies may be done to determine the cause of the infections. How is this treated? Treatment for this condition often includes a combination of two or more of the following:  Antibiotic medicine.  Other medicines to treat less common causes of UTI.  Over-the-counter medicines to treat pain.  Drinking enough water to stay hydrated.  Follow these instructions at home:  Take over-the-counter and prescription medicines only as told by your health care provider.  If you were prescribed an antibiotic, take it as told by your health care provider. Do not stop taking the antibiotic even if you start to feel better.  Avoid alcohol, caffeine, tea, and carbonated beverages. They can irritate your bladder.  Drink enough fluid to keep your urine clear or pale yellow.  Keep all follow-up visits as told by your health care provider. This is important.  Make sure to: ? Empty your bladder often and completely. Do not hold urine for long periods of time. ? Empty your bladder  before and after sex. ? Wipe from front to back after a bowel movement if you are female. Use each tissue one time when you wipe. Contact a health care provider if:  You have back pain.  You have a fever.  You feel nauseous or vomit.  Your symptoms do not get better after 3 days.  Your symptoms go away and then return. Get help right away if:  You have severe back pain or lower  abdominal pain.  You are vomiting and cannot keep down any medicines or water. This information is not intended to replace advice given to you by your health care provider. Make sure you discuss any questions you have with your health care provider. Document Released: 07/30/2005 Document Revised: 04/02/2016 Document Reviewed: 09/10/2015 Elsevier Interactive Patient Education  [employer].

## 2020-03-11 LAB — URINE CULTURE

## 2020-04-09 ENCOUNTER — Encounter: Payer: Self-pay | Admitting: Podiatry

## 2020-04-09 ENCOUNTER — Other Ambulatory Visit: Payer: Self-pay

## 2020-04-09 ENCOUNTER — Ambulatory Visit: Payer: Medicare Other | Admitting: Podiatry

## 2020-04-09 DIAGNOSIS — M79676 Pain in unspecified toe(s): Secondary | ICD-10-CM

## 2020-04-09 DIAGNOSIS — B351 Tinea unguium: Secondary | ICD-10-CM

## 2020-04-09 DIAGNOSIS — E1142 Type 2 diabetes mellitus with diabetic polyneuropathy: Secondary | ICD-10-CM

## 2020-04-09 DIAGNOSIS — Q828 Other specified congenital malformations of skin: Secondary | ICD-10-CM

## 2020-04-09 LAB — HM DIABETES FOOT EXAM

## 2020-04-09 NOTE — Patient Instructions (Addendum)
Apply antibiotic ointment to second toe daily for one week  Diabetes Mellitus and [place] care is an important part of your health, especially when you have diabetes. Diabetes may cause you to have problems because of poor blood flow (circulation) to your feet and legs, which can cause your skin to:  Become thinner and drier.  Break more easily.  Heal more slowly.  Peel and crack. You may also have nerve damage (neuropathy) in your legs and feet, causing decreased feeling in them. This means that you may not notice minor injuries to your feet that could lead to more serious problems. Noticing and addressing any potential problems early is the best way to prevent future foot problems. How to care for your feet Foot hygiene  Wash your feet daily with warm water and mild soap. Do not use hot water. Then, pat your feet and the areas between your toes until they are completely dry. Do not soak your feet as this can dry your skin.  Trim your toenails straight across. Do not dig under them or around the cuticle. File the edges of your nails with an emery board or nail file.  Apply a moisturizing lotion or petroleum jelly to the skin on your feet and to dry, brittle toenails. Use lotion that does not contain alcohol and is unscented. Do not apply lotion between your toes. Shoes and socks  Wear clean socks or stockings every day. Make sure they are not too tight. Do not wear knee-high stockings since they may decrease blood flow to your legs.  Wear shoes that fit properly and have enough cushioning. Always look in your shoes before you put them on to be sure there are no objects inside.  To break in new shoes, wear them for just a few hours a day. This prevents injuries on your feet. Wounds, scrapes, corns, and calluses  Check your feet daily for blisters, cuts, bruises, sores, and redness. If you cannot see the bottom of your feet, use a mirror or ask someone for help.  Do not cut  corns or calluses or try to remove them with medicine.  If you find a minor scrape, cut, or break in the skin on your feet, keep it and the skin around it clean and dry. You may clean these areas with mild soap and water. Do not clean the area with peroxide, alcohol, or iodine.  If you have a wound, scrape, corn, or callus on your foot, look at it several times a day to make sure it is healing and not infected. Check for: ? Redness, swelling, or pain. ? Fluid or blood. ? Warmth. ? Pus or a bad smell. General instructions  Do not cross your legs. This may decrease blood flow to your feet.  Do not use heating pads or hot water bottles on your feet. They may burn your skin. If you have lost feeling in your feet or legs, you may not know this is happening until it is too late.  Protect your feet from hot and cold by wearing shoes, such as at the beach or on hot pavement.  Schedule a complete foot exam at least once a year (annually) or more often if you have foot problems. If you have foot problems, report any cuts, sores, or bruises to your health care provider immediately. Contact a health care provider if:  You have a medical condition that increases your risk of infection and you have any cuts, sores, or bruises  on your feet.  You have an injury that is not healing.  You have redness on your legs or feet.  You feel burning or tingling in your legs or feet.  You have pain or cramps in your legs and feet.  Your legs or feet are numb.  Your feet always feel cold.  You have pain around a toenail. Get help right away if:  You have a wound, scrape, corn, or callus on your foot and: ? You have pain, swelling, or redness that gets worse. ? You have fluid or blood coming from the wound, scrape, corn, or callus. ? Your wound, scrape, corn, or callus feels warm to the touch. ? You have pus or a bad smell coming from the wound, scrape, corn, or callus. ? You have a fever. ? You have a  red line going up your leg. Summary  Check your feet every day for cuts, sores, red spots, swelling, and blisters.  Moisturize feet and legs daily.  Wear shoes that fit properly and have enough cushioning.  If you have foot problems, report any cuts, sores, or bruises to your health care provider immediately.  Schedule a complete foot exam at least once a year (annually) or more often if you have foot problems. This information is not intended to replace advice given to you by your health care provider. Make sure you discuss any questions you have with your health care provider. Document Revised: 07/13/2019 Document Reviewed: 11/21/2016 Elsevier Patient Education  [place].

## 2020-04-11 ENCOUNTER — Ambulatory Visit (INDEPENDENT_AMBULATORY_CARE_PROVIDER_SITE_OTHER): Payer: Medicare Other | Admitting: *Deleted

## 2020-04-11 DIAGNOSIS — I495 Sick sinus syndrome: Secondary | ICD-10-CM

## 2020-04-12 LAB — CUP PACEART REMOTE DEVICE CHECK
Battery Impedance: 1515 Ohm
Battery Remaining Longevity: 44 mo
Battery Voltage: 2.76 V
Brady Statistic AP VP Percent: 6 %
Brady Statistic AP VS Percent: 1 %
Brady Statistic AS VP Percent: 7 %
Brady Statistic AS VS Percent: 86 %
Date Time Interrogation Session: 20210609160152
Implantable Lead Implant Date: 20110912
Implantable Lead Implant Date: 20110912
Implantable Lead Location: 753859
Implantable Lead Location: 753860
Implantable Lead Model: 5076
Implantable Lead Model: 5092
Implantable Pulse Generator Implant Date: 20110912
Lead Channel Impedance Value: 502 Ohm
Lead Channel Impedance Value: 671 Ohm
Lead Channel Pacing Threshold Amplitude: 0.5 V
Lead Channel Pacing Threshold Amplitude: 0.75 V
Lead Channel Pacing Threshold Pulse Width: 0.4 ms
Lead Channel Pacing Threshold Pulse Width: 0.4 ms
Lead Channel Setting Pacing Amplitude: 2 V
Lead Channel Setting Pacing Amplitude: 2.5 V
Lead Channel Setting Pacing Pulse Width: 0.4 ms
Lead Channel Setting Sensing Sensitivity: 2 mV

## 2020-04-13 NOTE — Progress Notes (Signed)
Subjective: GWYN HIERONYMUS presents today at risk foot care with history of diabetic neuropathy and painful porokeratotic lesion(s) b/l and painful mycotic toenails b/l that limit ambulation. Aggravating factors include weightbearing with and without shoe gear. Pain for both is relieved with periodic professional debridement.  Binnie Rail, MD is patient's PCP. Last visit was: 10/19/2019.  Past Medical History:  Diagnosis Date  . Breast cancer ([place])   . CAD (coronary artery disease)    a. s/p DESx2 to mid and distal RCA in 2010.  Marland Kitchen Cholelithiasis 09/12/2013   Lap Chole on 09/14/13   . Chronic combined systolic and diastolic CHF (congestive heart failure) ([place])    a. Echo 06/09/15: EF 55-60% -> 10/2017 EF 30-35% (pt and daughter did not wish to proceed with ischemic assessment - conservative rx).  . CKD (chronic kidney disease), stage III   . Diabetes mellitus    NON INSULIN DEPENDENT  . DJD (degenerative joint disease)   . GERD (gastroesophageal reflux disease)   . Hemorrhoids   . History of diabetic neuropathy   . Hypercholesterolemia   . Hypertension   . Hypertensive heart disease   . Permanent atrial fibrillation (HCC)    a. discovered on PPM interrogation 12/15- > eventually progressed to 100% atrial fib burden/permanent atrial fib.  . SSS (sick sinus syndrome) ([place])    a. s/p Medtronic PPM by JA for SSS and syncope 9/11.     Current Outpatient Medications on File Prior to Visit  Medication Sig Dispense Refill  . b complex vitamins tablet Take 1 tablet by mouth daily.    . blood glucose meter kit and supplies KIT Dispense based on patient and insurance preference. Test sugars once daily and prn as directed. (FOR ICD-9 250.00, 250.01). 1 each 0  . carvedilol (COREG) 25 MG tablet Take 1 tablet (25 mg total) by mouth 2 (two) times daily. 180 tablet 3  . ciprofloxacin (CIPRO) 250 MG tablet Take 1 tablet (250 mg total) by mouth 2 (two) times daily. 10 tablet 0  . docusate sodium  (COLACE) 100 MG capsule Take 100 mg by mouth at bedtime.    Marland Kitchen ECHINACEA PO Take 750 mg by mouth daily.    Marland Kitchen ELIQUIS 5 MG TABS tablet Take 1 tablet by mouth twice daily 180 tablet 1  . fish oil-omega-3 fatty acids 1000 MG capsule Take 1 g by mouth every morning.     . furosemide (LASIX) 20 MG tablet TAKE 2 TABLETS BY MOUTH EVERY MORNING AND 1 TABLET EVERY AFTERNOON 270 tablet 2  . gabapentin (NEURONTIN) 100 MG capsule TAKE 1 CAPSULE BY MOUTH NIGHTLY TAKE  IN  ADDITION  TO  300  MG  CAPSULE  AT  NIGHT 90 capsule 0  . gabapentin (NEURONTIN) 300 MG capsule Take 300 mg by mouth at bedtime.     Marland Kitchen lisinopril (ZESTRIL) 5 MG tablet Take 1.5 tablets (7.5 mg total) by mouth daily. 135 tablet 3  . Misc. Devices (ROLLATOR ULTRA-LIGHT) MISC With seat.  Diabetic neuropathy, knee arthritis 1 each 0  . nitroGLYCERIN (NITROSTAT) 0.4 MG SL tablet Place 1 tablet (0.4 mg total) under the tongue every 5 (five) minutes as needed for chest pain. x3 doses as needed for chest pain 25 tablet 0  . potassium chloride SA (KLOR-CON) 20 MEQ tablet TAKE 1  BY MOUTH ONCE DAILY 90 tablet 0  . rosuvastatin (CRESTOR) 5 MG tablet Take 1 tablet (5 mg total) by mouth daily at 6 PM. 90 tablet 1  No current facility-administered medications on file prior to visit.     Allergies  Allergen Reactions  . Lyrica [Pregabalin] Swelling    Caused weight gain  . Amlodipine Swelling  . Sulfonamide Derivatives Rash    Objective: FEROL LAICHE is a pleasant 86 y.o. y.o. Patient Race: White or Caucasian [1]  female in NAD. AAO x 3.  There were no vitals filed for this visit.  Vascular Examination: Neurovascular status unchanged b/l lower extremities. Capillary fill time to digits <3 seconds b/l lower extremities. Palpable DP pulses b/l. Palpable PT pulses b/l. Pedal hair absent b/l. Skin temperature gradient within normal limits b/l. No pain with calf compression b/l. No edema noted b/l.  Dermatological Examination: Pedal skin with  normal turgor, texture and tone bilaterally. No open wounds bilaterally. No interdigital macerations bilaterally. Toenails 1-5 b/l elongated, discolored, dystrophic, thickened, crumbly with subungual debris and tenderness to dorsal palpation. Porokeratotic lesion(s) R 4th toe and submet head 4 left foot. No erythema, no edema, no drainage, no flocculence.  Musculoskeletal: Normal muscle strength 5/5 to all lower extremity muscle groups bilaterally. No pain crepitus or joint limitation noted with ROM b/l. Hammertoes noted to the 2-5 bilaterally.  Neurological Examination: Protective sensation decreased with 10 gram monofilament b/l. Vibratory sensation decreased b/l.  Assessment: 1. Pain due to onychomycosis of toenail   2. Porokeratosis   3. Diabetic peripheral neuropathy associated with type 2 diabetes mellitus ([place])   Plan: -Examined patient. -Continue diabetic foot care principles. -Toenails 1-5 b/l were debrided in length and girth with sterile nail nippers and dremel without iatrogenic bleeding.  -Painful porokeratotic lesion(s) R 4th toe and submet head 4 left foot pared and enucleated with sterile scalpel blade without incident. -Patient to report any pedal injuries to medical professional immediately. -Patient to continue soft, supportive shoe gear daily. -Patient/POA to call should there be question/concern in the interim.  Return in about 10 weeks (around 06/18/2020) for diabetic nail and callus trim.  Marzetta Board, DPM

## 2020-04-16 NOTE — Progress Notes (Signed)
Remote pacemaker transmission.   

## 2020-04-17 ENCOUNTER — Encounter: Payer: Self-pay | Admitting: Internal Medicine

## 2020-04-17 NOTE — Patient Instructions (Addendum)
  Blood work was ordered.   ° ° °Medications reviewed and updated.  Changes include :   none ° ° ° °Please followup in 6 months ° ° °

## 2020-04-17 NOTE — Progress Notes (Signed)
Right calf slightly swollen.    Subjective:    Patient ID: Jessica [name], female    DOB: 12-26-1926, 86 y.o.   MRN: [id number]  HPI The patient is here for follow up of their chronic medical problems, including Afib, CHF, htn, diabetes w/ neuropathy, hyperlipidemia, CKD   Right calf a little swollen and tender. Overall swelling controlled.     Medications and allergies reviewed with patient and updated if appropriate.  Patient Active Problem List   Diagnosis Date Noted  . Abdominal bloating 03/09/2020  . Chronic constipation 03/09/2020  . Dysuria 10/19/2019  . H/O bilateral mastectomy 10/13/2018  . HX: breast cancer 10/13/2018  . Permanent atrial fibrillation ([place]) 10/31/2017  . Cardiac pacemaker in situ   . Chronic combined systolic and diastolic CHF (congestive heart failure) ([place]) 10/12/2017  . UTI (urinary tract infection) 03/28/2017  . Bilateral leg edema 04/29/2016  . CKD (chronic kidney disease) stage 3, GFR 30-59 ml/min (HCC) 06/10/2015  . CAD (coronary artery disease) 06/09/2015  . Diabetic neuropathy ([place]) 07/06/2013  . SSS (sick sinus syndrome) ([place]) 02/10/2013  . Hypertension 07/30/2012  . Pacemaker-Medtronic 07/19/2012  . Benign hypertensive heart disease with heart failure ([place]) 01/28/2012  . Diabetes ([place]) 10/22/2010  . HYPERCHOLESTEROLEMIA 10/22/2010  . DEGENERATIVE JOINT DISEASE 10/22/2010    Current Outpatient Medications on File Prior to Visit  Medication Sig Dispense Refill  . b complex vitamins tablet Take 1 tablet by mouth daily.    . blood glucose meter kit and supplies KIT Dispense based on patient and insurance preference. Test sugars once daily and prn as directed. (FOR ICD-9 250.00, 250.01). 1 each 0  . docusate sodium (COLACE) 100 MG capsule Take 100 mg by mouth at bedtime.    Marland Kitchen ECHINACEA PO Take 750 mg by mouth daily.    Marland Kitchen ELIQUIS 5 MG TABS tablet Take 1 tablet by mouth twice daily 180 tablet 1  . fish oil-omega-3 fatty acids 1000 MG capsule  Take 1 g by mouth every morning.     . furosemide (LASIX) 20 MG tablet TAKE 2 TABLETS BY MOUTH EVERY MORNING AND 1 TABLET EVERY AFTERNOON 270 tablet 2  . gabapentin (NEURONTIN) 100 MG capsule TAKE 1 CAPSULE BY MOUTH NIGHTLY TAKE  IN  ADDITION  TO  300  MG  CAPSULE  AT  NIGHT 90 capsule 0  . gabapentin (NEURONTIN) 300 MG capsule Take 300 mg by mouth at bedtime.     Marland Kitchen lisinopril (ZESTRIL) 5 MG tablet Take 1.5 tablets (7.5 mg total) by mouth daily. 135 tablet 3  . Misc. Devices (ROLLATOR ULTRA-LIGHT) MISC With seat.  Diabetic neuropathy, knee arthritis 1 each 0  . nitroGLYCERIN (NITROSTAT) 0.4 MG SL tablet Place 1 tablet (0.4 mg total) under the tongue every 5 (five) minutes as needed for chest pain. x3 doses as needed for chest pain 25 tablet 0  . potassium chloride SA (KLOR-CON) 20 MEQ tablet TAKE 1  BY MOUTH ONCE DAILY 90 tablet 0  . rosuvastatin (CRESTOR) 5 MG tablet Take 1 tablet (5 mg total) by mouth daily at 6 PM. 90 tablet 1  . carvedilol (COREG) 25 MG tablet Take 1 tablet (25 mg total) by mouth 2 (two) times daily. 180 tablet 3   No current facility-administered medications on file prior to visit.    Past Medical History:  Diagnosis Date  . Breast cancer ([place])   . CAD (coronary artery disease)    a. s/p DESx2 to mid and distal RCA in 2010.  Marland Kitchen  Cholelithiasis 09/12/2013   Lap Chole on 09/14/13   . Chronic combined systolic and diastolic CHF (congestive heart failure) (HCC)    a. Echo 06/09/15: EF 55-60% -> 10/2017 EF 30-35% (pt and daughter did not wish to proceed with ischemic assessment - conservative rx).  . CKD (chronic kidney disease), stage III   . Diabetes mellitus    NON INSULIN DEPENDENT  . DJD (degenerative joint disease)   . GERD (gastroesophageal reflux disease)   . Hemorrhoids   . History of diabetic neuropathy   . Hypercholesterolemia   . Hypertension   . Hypertensive heart disease   . Permanent atrial fibrillation (HCC)    a. discovered on PPM interrogation 12/15- >  eventually progressed to 100% atrial fib burden/permanent atrial fib.  . SSS (sick sinus syndrome) (HCC)    a. s/p Medtronic PPM by JA for SSS and syncope 9/11.    Past Surgical History:  Procedure Laterality Date  . CARDIAC CATHETERIZATION  07/05/2009   EF 55-60%  . CARDIOVERSION N/A 06/11/2015   Procedure: CARDIOVERSION;  Surgeon: Katarina H Nelson, MD;  Location: [facility];  Service: Cardiovascular;  Laterality: N/A;  . CHOLECYSTECTOMY N/A 09/14/2013   Procedure: LAPAROSCOPIC CHOLECYSTECTOMY WITH INTRAOPERATIVE CHOLANGIOGRAM;  Surgeon: Thomas A. Cornett, MD;  Location: [facility] OR;  Service: General;  Laterality: N/A;  . COLONOSCOPY    . ERCP N/A 09/13/2013   Procedure: ENDOSCOPIC RETROGRADE CHOLANGIOPANCREATOGRAPHY (ERCP);  Surgeon: Patrick D Hung, MD;  Location: [facility] END[facility];  Service: Endoscopy;  Laterality: N/A;  . INSERT / REPLACE / REMOVE PACEMAKER  9/11   SSS and syncope, implanted by JA (MDT)  . MASTECTOMY  1992   BILATERAL WITH RECONSTRUCTION    Social History   Socioeconomic History  . Marital status: Widowed    Spouse name: Not on file  . Number of children: Not on file  . Years of education: Not on file  . Highest education level: Not on file  Occupational History  . Not on file  Tobacco Use  . Smoking status: Never Smoker  . Smokeless tobacco: Never Used  Vaping Use  . Vaping Use: Never used  Substance and Sexual Activity  . Alcohol use: No    Alcohol/week: 0.0 standard drinks  . Drug use: No  . Sexual activity: Not on file  Other Topics Concern  . Not on file  Social History Narrative  . Not on file   Social Determinants of Health   Financial Resource Strain:   . Difficulty of Paying Living Expenses:   Food Insecurity:   . Worried About Running Out of Food in the Last Year:   . Ran Out of Food in the Last Year:   Transportation Needs:   . Lack of Transportation (Medical):   . Lack of Transportation (Non-Medical):   Physical Activity:   . Days of  Exercise per Week:   . Minutes of Exercise per Session:   Stress:   . Feeling of Stress :   Social Connections:   . Frequency of Communication with Friends and Family:   . Frequency of Social Gatherings with Friends and Family:   . Attends Religious Services:   . Active Member of Clubs or Organizations:   . Attends Club or Organization Meetings:   . Marital Status:     Family History  Problem Relation Age of Onset  . Cancer Mother   . Cancer Father     Review of Systems  Constitutional: Negative for fever.  Respiratory: Negative for cough,   shortness of breath and wheezing.   Cardiovascular: Negative for chest pain, palpitations and leg swelling.  Neurological: Negative for light-headedness and headaches.       Objective:   Vitals:   04/18/20 1521  BP: (!) 142/80  Pulse: 60  Temp: 98.2 F (36.8 C)  SpO2: 98%   BP Readings from Last 3 Encounters:  04/18/20 (!) 142/80  03/09/20 (!) 144/86  12/13/19 (!) 158/74   Wt Readings from Last 3 Encounters:  04/18/20 158 lb (71.7 kg)  03/09/20 162 lb (73.5 kg)  12/13/19 163 lb (73.9 kg)   Body mass index is 27.99 kg/m.   Physical Exam    Constitutional: Appears well-developed and well-nourished. No distress.  HENT:  Head: Normocephalic and atraumatic.  Neck: Neck supple. No tracheal deviation present. No thyromegaly present.  No cervical lymphadenopathy Cardiovascular: Normal rate, regular rhythm and normal heart sounds.   2/6 systolic murmur heard. No carotid bruit .  Trace b/l LE edema Pulmonary/Chest: Effort normal and breath sounds normal. No respiratory distress. No has no wheezes. No rales.  Skin: Skin is warm and dry. Not diaphoretic.  Psychiatric: Normal mood and affect. Behavior is normal.      Assessment & Plan:    See Problem List for Assessment and Plan of chronic medical problems.    This visit occurred during the SARS-CoV-2 public health emergency.  Safety protocols were in place, including  screening questions prior to the visit, additional usage of staff PPE, and extensive cleaning of exam room while observing appropriate contact time as indicated for disinfecting solutions.    

## 2020-04-18 ENCOUNTER — Other Ambulatory Visit: Payer: Self-pay

## 2020-04-18 ENCOUNTER — Encounter: Payer: Self-pay | Admitting: Internal Medicine

## 2020-04-18 ENCOUNTER — Ambulatory Visit: Payer: Medicare Other | Admitting: Internal Medicine

## 2020-04-18 VITALS — BP 142/80 | HR 60 | Temp 98.2°F | Ht 63.0 in | Wt 158.0 lb

## 2020-04-18 DIAGNOSIS — I5042 Chronic combined systolic (congestive) and diastolic (congestive) heart failure: Secondary | ICD-10-CM | POA: Diagnosis not present

## 2020-04-18 DIAGNOSIS — E0842 Diabetes mellitus due to underlying condition with diabetic polyneuropathy: Secondary | ICD-10-CM

## 2020-04-18 DIAGNOSIS — I10 Essential (primary) hypertension: Secondary | ICD-10-CM

## 2020-04-18 DIAGNOSIS — E1140 Type 2 diabetes mellitus with diabetic neuropathy, unspecified: Secondary | ICD-10-CM | POA: Diagnosis not present

## 2020-04-18 DIAGNOSIS — N1831 Chronic kidney disease, stage 3a: Secondary | ICD-10-CM

## 2020-04-18 DIAGNOSIS — I4821 Permanent atrial fibrillation: Secondary | ICD-10-CM | POA: Diagnosis not present

## 2020-04-18 DIAGNOSIS — E7800 Pure hypercholesterolemia, unspecified: Secondary | ICD-10-CM

## 2020-04-18 LAB — COMPREHENSIVE METABOLIC PANEL
ALT: 12 U/L (ref 0–35)
AST: 15 U/L (ref 0–37)
Albumin: 4.1 g/dL (ref 3.5–5.2)
Alkaline Phosphatase: 89 U/L (ref 39–117)
BUN: 33 mg/dL — ABNORMAL HIGH (ref 6–23)
CO2: 30 mEq/L (ref 19–32)
Calcium: 10.2 mg/dL (ref 8.4–10.5)
Chloride: 106 mEq/L (ref 96–112)
Creatinine, Ser: 1.27 mg/dL — ABNORMAL HIGH (ref 0.40–1.20)
GFR: 39.43 mL/min — ABNORMAL LOW (ref >60.00)
Glucose, Bld: 109 mg/dL — ABNORMAL HIGH (ref 70–99)
Potassium: 4.4 mEq/L (ref 3.5–5.1)
Sodium: 139 mEq/L (ref 135–145)
Total Bilirubin: 0.7 mg/dL (ref 0.2–1.2)
Total Protein: 7 g/dL (ref 6.0–8.3)

## 2020-04-18 LAB — CBC WITH DIFFERENTIAL/PLATELET
Basophils Absolute: 0.1 10*3/uL (ref 0.0–0.1)
Basophils Relative: 0.8 % (ref 0.0–3.0)
Eosinophils Absolute: 0.1 10*3/uL (ref 0.0–0.7)
Eosinophils Relative: 1 % (ref 0.0–5.0)
HCT: 38.5 % (ref 36.0–46.0)
Hemoglobin: 13.5 g/dL (ref 12.0–15.0)
Lymphocytes Relative: 30 % (ref 12.0–46.0)
Lymphs Abs: 2.5 10*3/uL (ref 0.7–4.0)
MCHC: 35.1 g/dL (ref 30.0–36.0)
MCV: 89.4 fl (ref 78.0–100.0)
Monocytes Absolute: 0.6 10*3/uL (ref 0.1–1.0)
Monocytes Relative: 7.4 % (ref 3.0–12.0)
Neutro Abs: 5 10*3/uL (ref 1.4–7.7)
Neutrophils Relative %: 60.8 % (ref 43.0–77.0)
Platelets: 197 10*3/uL (ref 150.0–400.0)
RBC: 4.3 Mil/uL (ref 3.87–5.11)
RDW: 14.9 % (ref 11.5–15.5)
WBC: 8.2 10*3/uL (ref 4.0–10.5)

## 2020-04-18 NOTE — Assessment & Plan Note (Signed)
Chronic Diet controlled Check a1c Low sugar / carb diet    

## 2020-04-18 NOTE — Assessment & Plan Note (Signed)
Chronic Sugars well controlled contnue gabapentin

## 2020-04-18 NOTE — Assessment & Plan Note (Signed)
Chronic Controlled, no symptoms Cbc, cmp

## 2020-04-18 NOTE — Assessment & Plan Note (Signed)
Chronic cmp 

## 2020-04-18 NOTE — Assessment & Plan Note (Signed)
Chronic  euvolemic Continue current medication cmp

## 2020-04-18 NOTE — Assessment & Plan Note (Signed)
Chronic lipid panel controlled Continue daily statin

## 2020-04-18 NOTE — Assessment & Plan Note (Signed)
Chronic BP well controlled Current regimen effective and well tolerated Continue current medications at current doses cmp  

## 2020-04-19 LAB — HEMOGLOBIN A1C: Hgb A1c MFr Bld: 7 % — ABNORMAL HIGH (ref 4.6–6.5)

## 2020-04-20 ENCOUNTER — Ambulatory Visit: Payer: Medicare Other | Admitting: Podiatry

## 2020-04-26 ENCOUNTER — Other Ambulatory Visit: Payer: Self-pay | Admitting: Internal Medicine

## 2020-04-26 DIAGNOSIS — I5033 Acute on chronic diastolic (congestive) heart failure: Secondary | ICD-10-CM

## 2020-04-26 DIAGNOSIS — I5042 Chronic combined systolic (congestive) and diastolic (congestive) heart failure: Secondary | ICD-10-CM

## 2020-04-26 NOTE — Telephone Encounter (Signed)
Prescription refill request for Eliquis received. Indication: A Fib Last office visit: 12/13/19 Dayna Dunn Scr: 1.27 04/18/20 Age: 86 Weight: 71.7 kg  Prescription sent to pharmacy

## 2020-06-05 ENCOUNTER — Other Ambulatory Visit: Payer: Self-pay | Admitting: Cardiology

## 2020-06-05 DIAGNOSIS — I10 Essential (primary) hypertension: Secondary | ICD-10-CM

## 2020-06-05 DIAGNOSIS — I5043 Acute on chronic combined systolic (congestive) and diastolic (congestive) heart failure: Secondary | ICD-10-CM

## 2020-06-05 DIAGNOSIS — E7800 Pure hypercholesterolemia, unspecified: Secondary | ICD-10-CM

## 2020-06-05 DIAGNOSIS — I2510 Atherosclerotic heart disease of native coronary artery without angina pectoris: Secondary | ICD-10-CM

## 2020-06-25 ENCOUNTER — Other Ambulatory Visit: Payer: Self-pay

## 2020-06-25 ENCOUNTER — Ambulatory Visit: Payer: Medicare Other | Admitting: Podiatry

## 2020-06-25 DIAGNOSIS — B351 Tinea unguium: Secondary | ICD-10-CM

## 2020-06-25 DIAGNOSIS — E1142 Type 2 diabetes mellitus with diabetic polyneuropathy: Secondary | ICD-10-CM | POA: Diagnosis not present

## 2020-06-25 DIAGNOSIS — Q828 Other specified congenital malformations of skin: Secondary | ICD-10-CM | POA: Diagnosis not present

## 2020-06-25 DIAGNOSIS — M79676 Pain in unspecified toe(s): Secondary | ICD-10-CM | POA: Diagnosis not present

## 2020-06-28 ENCOUNTER — Encounter: Payer: Self-pay | Admitting: Podiatry

## 2020-06-28 NOTE — Progress Notes (Signed)
Subjective: Jessica [name] presents today at risk foot care with history of diabetic neuropathy and painful porokeratotic lesion(s) b/l and painful mycotic toenails b/l that limit ambulation. Aggravating factors include weightbearing with and without shoe gear. Pain for both is relieved with periodic professional debridement.  Binnie Rail, MD is patient's PCP. Last visit was: 03/09/2020.  Her daughter is present during today's visit. She c/o painful right 4th digit on today's visit. Digit is tender to touch.  Past Medical History:  Diagnosis Date  . Breast cancer ([place])   . CAD (coronary artery disease)    a. s/p DESx2 to mid and distal RCA in 2010.  Marland Kitchen Cholelithiasis 09/12/2013   Lap Chole on 09/14/13   . Chronic combined systolic and diastolic CHF (congestive heart failure) ([place])    a. Echo 06/09/15: EF 55-60% -> 10/2017 EF 30-35% (pt and daughter did not wish to proceed with ischemic assessment - conservative rx).  . CKD (chronic kidney disease), stage III   . Diabetes mellitus    NON INSULIN DEPENDENT  . DJD (degenerative joint disease)   . GERD (gastroesophageal reflux disease)   . Hemorrhoids   . History of diabetic neuropathy   . Hypercholesterolemia   . Hypertension   . Hypertensive heart disease   . Permanent atrial fibrillation ([facility])    a. discovered on PPM interrogation 12/15- > eventually progressed to 100% atrial fib burden/permanent atrial fib.  . SSS (sick sinus syndrome) ([place])    a. s/p Medtronic PPM by JA for SSS and syncope 9/11.     Current Outpatient Medications on File Prior to Visit  Medication Sig Dispense Refill  . b complex vitamins tablet Take 1 tablet by mouth daily.    . blood glucose meter kit and supplies KIT Dispense based on patient and insurance preference. Test sugars once daily and prn as directed. (FOR ICD-9 250.00, 250.01). 1 each 0  . carvedilol (COREG) 25 MG tablet Take 1 tablet (25 mg total) by mouth 2 (two) times daily. 180 tablet 3  .  docusate sodium (COLACE) 100 MG capsule Take 100 mg by mouth at bedtime.    Marland Kitchen ECHINACEA PO Take 750 mg by mouth daily.    Marland Kitchen ELIQUIS 5 MG TABS tablet Take 1 tablet by mouth twice daily 180 tablet 1  . fish oil-omega-3 fatty acids 1000 MG capsule Take 1 g by mouth every morning.     . furosemide (LASIX) 20 MG tablet TAKE 2 TABLETS BY MOUTH EVERY MORNING AND 1 TABLET EVERY AFTERNOON 270 tablet 2  . gabapentin (NEURONTIN) 100 MG capsule TAKE 1 CAPSULE BY MOUTH NIGHTLY TAKE  IN  ADDITION  TO  300  MG  CAPSULE  AT  NIGHT 270 capsule 1  . gabapentin (NEURONTIN) 300 MG capsule Take 300 mg by mouth at bedtime.     Marland Kitchen lisinopril (ZESTRIL) 5 MG tablet Take 1.5 tablets (7.5 mg total) by mouth daily. 135 tablet 3  . Misc. Devices (ROLLATOR ULTRA-LIGHT) MISC With seat.  Diabetic neuropathy, knee arthritis 1 each 0  . nitroGLYCERIN (NITROSTAT) 0.4 MG SL tablet Place 1 tablet (0.4 mg total) under the tongue every 5 (five) minutes as needed for chest pain. x3 doses as needed for chest pain 25 tablet 0  . potassium chloride SA (KLOR-CON) 20 MEQ tablet TAKE 1  BY MOUTH ONCE DAILY 90 tablet 0  . rosuvastatin (CRESTOR) 5 MG tablet TAKE 1 TABLET BY MOUTH  DAILY AT 6 PM 90 tablet 1  No current facility-administered medications on file prior to visit.     Allergies  Allergen Reactions  . Lyrica [Pregabalin] Swelling    Caused weight gain  . Amlodipine Swelling  . Sulfonamide Derivatives Rash    Objective: Jessica [name] is a pleasant 86 y.o. y.o. Patient Race: White or Caucasian [1]  female in NAD. AAO x 3.  There were no vitals filed for this visit.  Vascular Examination: Neurovascular status unchanged b/l lower extremities. Capillary fill time to digits <3 seconds b/l lower extremities. Palpable DP pulses b/l. Palpable PT pulses b/l. Pedal hair absent b/l. Skin temperature gradient within normal limits b/l. No pain with calf compression b/l. No edema noted b/l.  Dermatological Examination: Pedal skin  with normal turgor, texture and tone bilaterally. No open wounds bilaterally. No interdigital macerations bilaterally. Toenails 1-5 b/l elongated, discolored, dystrophic, thickened, crumbly with subungual debris and tenderness to dorsal palpation. Porokeratotic lesion(s) subungual R 4th toe and extends distally to distal tip of digit. No erythema, no edema, no drainage, no fluctuance. Porokeratotic lesion submet head 4 left foot. No erythema, no edema, no drainage, no fluctuance.  Musculoskeletal: Normal muscle strength 5/5 to all lower extremity muscle groups bilaterally. No pain crepitus or joint limitation noted with ROM b/l. Hammertoes noted to the 2-5 bilaterally. Utilizes rollator for ambulation assistance.  Neurological Examination: Protective sensation decreased with 10 gram monofilament b/l. Vibratory sensation decreased b/l.  Assessment: 1. Pain due to onychomycosis of toenail   2. Porokeratosis   3. Diabetic peripheral neuropathy associated with type 2 diabetes mellitus ([place])     Plan: -Examined patient. -Continue diabetic foot care principles. -Toenails 1-5 b/l were debrided in length and girth with sterile nail nippers and dremel without iatrogenic bleeding.  -Painful porokeratotic lesion(s) R 4th toe and submet head 4 left foot pared and enucleated with sterile scalpel blade without incident. -Patient to report any pedal injuries to medical professional immediately. -Dispensed digital toe cap forright 4th toe. Apply every morning. Remove every evening. -Patient to continue soft, supportive shoe gear daily. -Patient/POA to call should there be question/concern in the interim.  Return in about 9 weeks (around 08/27/2020) for diabetic nail and callus trim.  Marzetta Board, DPM

## 2020-07-11 ENCOUNTER — Ambulatory Visit (INDEPENDENT_AMBULATORY_CARE_PROVIDER_SITE_OTHER): Payer: Medicare Other | Admitting: *Deleted

## 2020-07-11 DIAGNOSIS — I495 Sick sinus syndrome: Secondary | ICD-10-CM | POA: Diagnosis not present

## 2020-07-11 LAB — CUP PACEART REMOTE DEVICE CHECK
Battery Impedance: 1623 Ohm
Battery Remaining Longevity: 41 mo
Battery Voltage: 2.76 V
Brady Statistic AP VP Percent: 7 %
Brady Statistic AP VS Percent: 1 %
Brady Statistic AS VP Percent: 8 %
Brady Statistic AS VS Percent: 85 %
Date Time Interrogation Session: 20210908113746
Implantable Lead Implant Date: 20110912
Implantable Lead Implant Date: 20110912
Implantable Lead Location: 753859
Implantable Lead Location: 753860
Implantable Lead Model: 5076
Implantable Lead Model: 5092
Implantable Pulse Generator Implant Date: 20110912
Lead Channel Impedance Value: 495 Ohm
Lead Channel Impedance Value: 680 Ohm
Lead Channel Pacing Threshold Amplitude: 0.625 V
Lead Channel Pacing Threshold Amplitude: 0.75 V
Lead Channel Pacing Threshold Pulse Width: 0.4 ms
Lead Channel Pacing Threshold Pulse Width: 0.4 ms
Lead Channel Setting Pacing Amplitude: 2 V
Lead Channel Setting Pacing Amplitude: 2.5 V
Lead Channel Setting Pacing Pulse Width: 0.4 ms
Lead Channel Setting Sensing Sensitivity: 2 mV

## 2020-07-12 NOTE — Progress Notes (Signed)
Remote pacemaker transmission.   

## 2020-07-17 ENCOUNTER — Encounter: Payer: Self-pay | Admitting: Cardiology

## 2020-07-30 ENCOUNTER — Other Ambulatory Visit: Payer: Self-pay | Admitting: Internal Medicine

## 2020-08-31 ENCOUNTER — Ambulatory Visit: Payer: Medicare Other | Admitting: Cardiology

## 2020-09-03 ENCOUNTER — Ambulatory Visit: Payer: Medicare Other | Admitting: Cardiology

## 2020-09-03 ENCOUNTER — Encounter: Payer: Self-pay | Admitting: Cardiology

## 2020-09-03 ENCOUNTER — Other Ambulatory Visit: Payer: Self-pay

## 2020-09-03 VITALS — BP 148/88 | HR 75 | Ht 63.0 in | Wt 157.8 lb

## 2020-09-03 DIAGNOSIS — I4821 Permanent atrial fibrillation: Secondary | ICD-10-CM

## 2020-09-03 DIAGNOSIS — I2510 Atherosclerotic heart disease of native coronary artery without angina pectoris: Secondary | ICD-10-CM

## 2020-09-03 DIAGNOSIS — Z7901 Long term (current) use of anticoagulants: Secondary | ICD-10-CM

## 2020-09-03 DIAGNOSIS — I5043 Acute on chronic combined systolic (congestive) and diastolic (congestive) heart failure: Secondary | ICD-10-CM

## 2020-09-03 MED ORDER — FEXOFENADINE HCL 180 MG PO TABS: 180.0000 mg | ORAL_TABLET | Freq: Every day | ORAL | Status: DC

## 2020-09-03 NOTE — Progress Notes (Signed)
Cardiology Office Note    Date:  09/03/2020   ID:  DIANI JILLSON, DOB [birth year], MRN [id number]  PCP:  Binnie Rail, MD  Cardiologist:  Ena Dawley, MD  Electrophysiologist:  None   Chief Complaint: f/u CHF, CAD, atrial fib  History of Present Illness:   Jessica [name] is a 86 y.o. female with history of CAD (DESx2 to mid and distal RCA in 2010), chronic combined CHF, hypertensive heart disease, DM with peripheral neuropathy, HTN, HLD, SSS s/p Medtronic pacemaker 2011, permanent atrial fib (discovered on device interrogation 10/2014), CKD stage III, moderate pulm HTN who presents for follow-up.  She was hospitalized for acute diastolic CHF in 02/8269 in the setting of accelerated HTN and PAF. She underwent DCCV at that time. She was managed at the end of 2017 with worsening CHF in the setting of previous noncompliance with fluid restriction (up to 4L of water per day). Over the last few years she has progressed to permanent atrial fibrillation. There has been prior issue trying to find a balance between euvolemia and renal function/blood pressure. She was admitted in 10/2017 for acute on chronic combined CHF with new cardiomyopathy with EF 30-35%, moderate diffuse hypokinesis, mild aortic insufficiency, mild mitral regurgitation, mildly increased PASP. The patient and daughter wished for conservative management and declined cath. Last labs personally reviewed from 10/2019 showed Hgb 13.8, K 4.4, Cr 1.10, LFTs wnl, A1c 7.0, LDL 73, TSH wnl.  The patient is coming for regular follow-up, she is accompanied by her daughter, she has been doing well, she has significant improvement in her lower extremity edema no orthopnea proximal nocturnal dyspnea.  She denies any chest pain.  She walks with a walker, her daughter states that she has been sleeping more than in the past, she still has very good appetite, denies any palpitation dizziness and no recent falls.  She has no bleeding other than  occasional fresh blood from her hemorrhoids but not recently.  She complains of recent problems with her memory.  She is also struggling with some allergies.   Past Medical History:  Diagnosis Date  . Breast cancer ([place])   . CAD (coronary artery disease)    a. s/p DESx2 to mid and distal RCA in 2010.  Marland Kitchen Cholelithiasis 09/12/2013   Lap Chole on 09/14/13   . Chronic combined systolic and diastolic CHF (congestive heart failure) ([place])    a. Echo 06/09/15: EF 55-60% -> 10/2017 EF 30-35% (pt and daughter did not wish to proceed with ischemic assessment - conservative rx).  . CKD (chronic kidney disease), stage III ([place])   . Diabetes mellitus    NON INSULIN DEPENDENT  . DJD (degenerative joint disease)   . GERD (gastroesophageal reflux disease)   . Hemorrhoids   . History of diabetic neuropathy   . Hypercholesterolemia   . Hypertension   . Hypertensive heart disease   . Permanent atrial fibrillation (HCC)    a. discovered on PPM interrogation 12/15- > eventually progressed to 100% atrial fib burden/permanent atrial fib.  . SSS (sick sinus syndrome) ([place])    a. s/p Medtronic PPM by JA for SSS and syncope 9/11.    Past Surgical History:  Procedure Laterality Date  . CARDIAC CATHETERIZATION  07/05/2009   EF 55-60%  . CARDIOVERSION N/A 06/11/2015   Procedure: CARDIOVERSION;  Surgeon: [name], MD;  Location: [place];  Service: Cardiovascular;  Laterality: N/A;  . CHOLECYSTECTOMY N/A 09/14/2013   Procedure: LAPAROSCOPIC CHOLECYSTECTOMY WITH INTRAOPERATIVE CHOLANGIOGRAM;  Surgeon: [name], MD;  Location: [place];  Service: General;  Laterality: N/A;  . COLONOSCOPY    . ERCP N/A 09/13/2013   Procedure: ENDOSCOPIC RETROGRADE CHOLANGIOPANCREATOGRAPHY (ERCP);  Surgeon: [name], MD;  Location[facility] END[facility]COPY;  Service: Endoscopy;  Laterality: N/A;  . INSERT / REPLACE / REMOVE PACEMAKER  9/11   SSS and syncope, implanted by JA (MDT)  . MASTECTOMY  1992   BILATERAL WITH  RECONSTRUCTION    Current Medications: Current Meds  Medication Sig  . b complex vitamins tablet Take 1 tablet by mouth daily.  . blood glucose meter kit and supplies KIT Dispense based on patient and insurance preference. Test sugars once daily and prn as directed. (FOR ICD-9 250.00, 250.01).  . ciprofloxacin (CIPRO) 250 MG tablet Take 1 tablet (250 mg total) by mouth 2 (two) times daily.  Marland Kitchen docusate sodium (COLACE) 100 MG capsule Take 100 mg by mouth at bedtime.  Marland Kitchen ECHINACEA PO Take 750 mg by mouth daily.  Marland Kitchen ELIQUIS 5 MG TABS tablet Take 1 tablet by mouth twice daily  . fish oil-omega-3 fatty acids 1000 MG capsule Take 1 g by mouth every morning.   . furosemide (LASIX) 20 MG tablet TAKE 2 TABLETS BY MOUTH EVERY MORNING AND 1 TABLET EVERY AFTERNOON  . gabapentin (NEURONTIN) 100 MG capsule TAKE 1 CAPSULE BY MOUTH NIGHTLY TAKE  IN  ADDITION  TO  300  MG  CAPSULE  AT  NIGHT  . gabapentin (NEURONTIN) 300 MG capsule Take 300 mg by mouth 2 (two) times daily.  . Misc. Devices (ROLLATOR ULTRA-LIGHT) MISC With seat.  Diabetic neuropathy, knee arthritis  . nitroGLYCERIN (NITROSTAT) 0.4 MG SL tablet Place 1 tablet (0.4 mg total) under the tongue every 5 (five) minutes as needed for chest pain. x3 doses as needed for chest pain  . potassium chloride SA (KLOR-CON) 20 MEQ tablet TAKE 1  BY MOUTH ONCE DAILY  . rosuvastatin (CRESTOR) 5 MG tablet Take 1 tablet (5 mg total) by mouth daily at 6 PM.  .  carvedilol (COREG) 12.5 MG tablet Take 1 tablet (12.5 mg total) by mouth 2 (two) times daily with a meal.  .  lisinopril (ZESTRIL) 5 MG tablet Take 1.5 tablets (7.5 mg total) by mouth daily.      Allergies:   Lyrica [pregabalin], Amlodipine, and Sulfonamide derivatives   Social History   Socioeconomic History  . Marital status: Widowed    Spouse name: Not on file  . Number of children: Not on file  . Years of education: Not on file  . Highest education level: Not on file  Occupational History  . Not  on file  Tobacco Use  . Smoking status: Never Smoker  . Smokeless tobacco: Never Used  Vaping Use  . Vaping Use: Never used  Substance and Sexual Activity  . Alcohol use: No    Alcohol/week: 0.0 standard drinks  . Drug use: No  . Sexual activity: Not on file  Other Topics Concern  . Not on file  Social History Narrative  . Not on file   Social Determinants of Health   Financial Resource Strain:   . Difficulty of Paying Living Expenses: Not on file  Food Insecurity:   . Worried About Charity fundraiser in the Last Year: Not on file  . Ran Out of Food in the Last Year: Not on file  Transportation Needs:   . Lack of Transportation (Medical): Not on file  . Lack of  Transportation (Non-Medical): Not on file  Physical Activity:   . Days of Exercise per Week: Not on file  . Minutes of Exercise per Session: Not on file  Stress:   . Feeling of Stress : Not on file  Social Connections:   . Frequency of Communication with Friends and Family: Not on file  . Frequency of Social Gatherings with Friends and Family: Not on file  . Attends Religious Services: Not on file  . Active Member of Clubs or Organizations: Not on file  . Attends Archivist Meetings: Not on file  . Marital Status: Not on file     Family History:  The patient's family history includes Cancer in her father and mother.  ROS:   Please see the history of present illness. Otherwise, review of systems is positive for .  All other systems are reviewed and otherwise negative.    EKGs/Labs/Other Studies Reviewed:    Studies reviewed are outlined and summarized above. Reports included below if pertinent.  2D echo 10/2017 - Procedure narrative: Transthoracic echocardiography. Image  quality was poor. The study was technically difficult, as a  result of poor acoustic windows, poor sound wave transmission,  chest wall deformity, and body habitus.  - Left ventricle: The cavity size was normal. Wall  thickness was  increased in a pattern of moderate LVH. Systolic function was  moderately to severely reduced. The estimated ejection fraction  was in the range of 30% to 35%. Moderate diffuse hypokinesis.  - Aortic valve: There was mild regurgitation.  - Mitral valve: Mildly calcified annulus. There was mild  regurgitation.  - Pulmonary arteries: Systolic pressure was mildly increased. PA  peak pressure: 40 mm Hg (S).     EKG:  EKG is ordered today, personally reviewed, demonstrating intermittent ventricular pacing, otherwise unchanged from prior.  This was personally reviewed.  Recent Labs: 10/19/2019: TSH 4.47 04/18/2020: ALT 12; BUN 33; Creatinine, Ser 1.27; Hemoglobin 13.5; Platelets 197.0; Potassium 4.4; Sodium 139  Recent Lipid Panel    Component Value Date/Time   CHOL 129 10/19/2019 1540   TRIG 75.0 10/19/2019 1540   HDL 41.60 10/19/2019 1540   CHOLHDL 3 10/19/2019 1540   VLDL 15.0 10/19/2019 1540   LDLCALC 73 10/19/2019 1540   LDLDIRECT 152.0 10/10/2013 1619    PHYSICAL EXAM:    VS:  BP (!) 148/88   Pulse 75   Ht 5' 3" (1.6 m)   Wt 157 lb 12.8 oz (71.6 kg)   LMP  (LMP Unknown)   SpO2 94%   BMI 27.95 kg/m   BMI: Body mass index is 27.95 kg/m.  GEN: Well nourished, well developed elderly WF, in no acute distress HEENT: normocephalic, atraumatic Neck: no JVD, carotid bruits, or masses Cardiac: RRR; no murmurs, rubs, or gallops, no edema  Respiratory:  clear to auscultation bilaterally, normal work of breathing GI: rounded but soft, + BS x4, nontender, no obvious masses MS: no deformity or atrophy, kyphotic posture Skin: warm and dry, no rash Neuro:  Alert and Oriented x 3, Strength and sensation are intact, follows commands Psych: euthymic mood, full affect  Wt Readings from Last 3 Encounters:  09/03/20 157 lb 12.8 oz (71.6 kg)  04/18/20 158 lb (71.7 kg)  03/09/20 162 lb (73.5 kg)     ASSESSMENT & PLAN:   1. CAD - no recent angina. Not on ASA  due to concomitant Eliquis with advanced age.  I will discontinue statin as she has memory impairment.   2.  Chronic combined CHF with mild AI/MR - appears euvolemic.  She continues to take low-dose of furosemide we will repeat her labs today.   3. Essential HTN - uncontrolled.  However she also has orthostatic hypotension and history of falls, I will continue same regimen.   4. HLD -discontinue pravastatin secondary to memory problems. 5. Permanent atrial fibrillation - rate controlled, tolerating Eliquis with normal Hgb and stable Cr in 10/2019.  No bleeding.  Repeat labs today.  Disposition: F/u with Dr. [name] in 6 months.  Medication Adjustments/Labs and Tests Ordered: Current medicines are reviewed at length with the patient today.  Concerns regarding medicines are outlined above. Medication changes, Labs and Tests ordered today are summarized above and listed in the Patient Instructions accessible in Encounters.   Signed, Ena Dawley, MD  09/03/2020 4:26 PM    [address], [place], [place]  [address] Phone: [phone]; Fax: [phone]

## 2020-09-03 NOTE — Patient Instructions (Signed)
Medication Instructions:   STOP TAKING ROSUVASTATIN NOW  START TAKING ALLEGRA OVER-THE-COUNTER 180 MG BY MOUTH DAILY  *If you need a refill on your cardiac medications before your next appointment, please call your pharmacy*   Lab Work:  TODAY--CMET, CBC, TSH, AND PRO-BNP  If you have labs (blood work) drawn today and your tests are completely normal, you will receive your results only by: Marland Kitchen MyChart Message (if you have MyChart) OR . A paper copy in the mail If you have any lab test that is abnormal or we need to change your treatment, we will call you to review the results.   Follow-Up: At [facility], you and your health needs are our priority.  As part of our continuing mission to provide you with exceptional heart care, we have created designated Provider Care Teams.  These Care Teams include your primary Cardiologist (physician) and Advanced Practice Providers (APPs -  Physician Assistants and Nurse Practitioners) who all work together to provide you with the care you need, when you need it.  We recommend signing up for the patient portal called "MyChart".  Sign up information is provided on this After Visit Summary.  MyChart is used to connect with patients for Virtual Visits (Telemedicine).  Patients are able to view lab/test results, encounter notes, upcoming appointments, etc.  Non-urgent messages can be sent to your provider as well.   To learn more about what you can do with MyChart, go to [email or web address].    Your next appointment:   6 month(s)  The format for your next appointment:   In Person  Provider:   Ena Dawley, MD

## 2020-09-04 LAB — CBC
Hematocrit: 39.3 % (ref 34.0–46.6)
Hemoglobin: 13.2 g/dL (ref 11.1–15.9)
MCH: 30.9 pg (ref 26.6–33.0)
MCHC: 33.6 g/dL (ref 31.5–35.7)
MCV: 92 fL (ref 79–97)
Platelets: 223 10*3/uL (ref 150–450)
RBC: 4.27 x10E6/uL (ref 3.77–5.28)
RDW: 13.4 % (ref 11.7–15.4)
WBC: 8 10*3/uL (ref 3.4–10.8)

## 2020-09-04 LAB — COMPREHENSIVE METABOLIC PANEL
ALT: 13 IU/L (ref 0–32)
AST: 16 IU/L (ref 0–40)
Albumin/Globulin Ratio: 1.8 (ref 1.2–2.2)
Albumin: 4.2 g/dL (ref 3.5–4.6)
Alkaline Phosphatase: 108 IU/L (ref 44–121)
BUN/Creatinine Ratio: 20 (ref 12–28)
BUN: 23 mg/dL (ref 10–36)
Bilirubin Total: 0.6 mg/dL (ref 0.0–1.2)
CO2: 27 mmol/L (ref 20–29)
Calcium: 10.2 mg/dL (ref 8.7–10.3)
Chloride: 109 mmol/L — ABNORMAL HIGH (ref 96–106)
Creatinine, Ser: 1.13 mg/dL — ABNORMAL HIGH (ref 0.57–1.00)
GFR calc Af Amer: 49 mL/min/{1.73_m2} — ABNORMAL LOW (ref >59)
GFR calc non Af Amer: 43 mL/min/{1.73_m2} — ABNORMAL LOW (ref >59)
Globulin, Total: 2.3 g/dL (ref 1.5–4.5)
Glucose: 115 mg/dL — ABNORMAL HIGH (ref 65–99)
Potassium: 4.4 mmol/L (ref 3.5–5.2)
Sodium: 146 mmol/L — ABNORMAL HIGH (ref 134–144)
Total Protein: 6.5 g/dL (ref 6.0–8.5)

## 2020-09-04 LAB — PRO B NATRIURETIC PEPTIDE: NT-Pro BNP: 4231 pg/mL — ABNORMAL HIGH (ref 0–738)

## 2020-09-04 LAB — TSH: TSH: 4.27 u[IU]/mL (ref 0.450–4.500)

## 2020-09-07 ENCOUNTER — Ambulatory Visit: Payer: Medicare Other | Admitting: Podiatry

## 2020-09-07 ENCOUNTER — Other Ambulatory Visit: Payer: Self-pay

## 2020-09-07 DIAGNOSIS — M79676 Pain in unspecified toe(s): Secondary | ICD-10-CM | POA: Diagnosis not present

## 2020-09-07 DIAGNOSIS — E1142 Type 2 diabetes mellitus with diabetic polyneuropathy: Secondary | ICD-10-CM

## 2020-09-07 DIAGNOSIS — M2042 Other hammer toe(s) (acquired), left foot: Secondary | ICD-10-CM

## 2020-09-07 DIAGNOSIS — M2041 Other hammer toe(s) (acquired), right foot: Secondary | ICD-10-CM

## 2020-09-07 DIAGNOSIS — Q828 Other specified congenital malformations of skin: Secondary | ICD-10-CM | POA: Diagnosis not present

## 2020-09-07 DIAGNOSIS — B351 Tinea unguium: Secondary | ICD-10-CM

## 2020-09-09 ENCOUNTER — Encounter: Payer: Self-pay | Admitting: Podiatry

## 2020-09-09 NOTE — Progress Notes (Signed)
Subjective: Jessica [name] presents today at risk foot care with history of diabetic neuropathy and painful porokeratotic lesion(s) b/l and painful mycotic toenails b/l that limit ambulation. Aggravating factors include weightbearing with and without shoe gear. Pain for both is relieved with periodic professional debridement.  Binnie Rail, MD is patient's PCP. Last visit was: 03/09/2020.  Her daughter is present during today's visit. She continues to  c/o painful right 4th digit on today's visit. Daughter states she may or may not be using her digital toe cap most times.  Past Medical History:  Diagnosis Date  . Breast cancer ([place])   . CAD (coronary artery disease)    a. s/p DESx2 to mid and distal RCA in 2010.  Marland Kitchen Cholelithiasis 09/12/2013   Lap Chole on 09/14/13   . Chronic combined systolic and diastolic CHF (congestive heart failure) ([place])    a. Echo 06/09/15: EF 55-60% -> 10/2017 EF 30-35% (pt and daughter did not wish to proceed with ischemic assessment - conservative rx).  . CKD (chronic kidney disease), stage III ([place])   . Diabetes mellitus    NON INSULIN DEPENDENT  . DJD (degenerative joint disease)   . GERD (gastroesophageal reflux disease)   . Hemorrhoids   . History of diabetic neuropathy   . Hypercholesterolemia   . Hypertension   . Hypertensive heart disease   . Permanent atrial fibrillation (HCC)    a. discovered on PPM interrogation 12/15- > eventually progressed to 100% atrial fib burden/permanent atrial fib.  . SSS (sick sinus syndrome) ([place])    a. s/p Medtronic PPM by JA for SSS and syncope 9/11.     Current Outpatient Medications on File Prior to Visit  Medication Sig Dispense Refill  . b complex vitamins tablet Take 1 tablet by mouth daily.    . blood glucose meter kit and supplies KIT Dispense based on patient and insurance preference. Test sugars once daily and prn as directed. (FOR ICD-9 250.00, 250.01). 1 each 0  . docusate sodium (COLACE) 100 MG capsule  Take 100 mg by mouth at bedtime.    Marland Kitchen ECHINACEA PO Take 750 mg by mouth daily.    Marland Kitchen ELIQUIS 5 MG TABS tablet Take 1 tablet by mouth twice daily 180 tablet 1  . fexofenadine (ALLEGRA ALLERGY) 180 MG tablet Take 1 tablet (180 mg total) by mouth daily.    . fish oil-omega-3 fatty acids 1000 MG capsule Take 1 g by mouth every morning.     . furosemide (LASIX) 20 MG tablet TAKE 2 TABLETS BY MOUTH EVERY MORNING AND 1 TABLET EVERY AFTERNOON 270 tablet 2  . gabapentin (NEURONTIN) 100 MG capsule TAKE 1 CAPSULE BY MOUTH NIGHTLY TAKE  IN  ADDITION  TO  300  MG  CAPSULE  AT  NIGHT 270 capsule 1  . gabapentin (NEURONTIN) 300 MG capsule Take 300 mg by mouth at bedtime.     Marland Kitchen lisinopril (ZESTRIL) 5 MG tablet Take 1.5 tablets (7.5 mg total) by mouth daily. 135 tablet 3  . Misc. Devices (ROLLATOR ULTRA-LIGHT) MISC With seat.  Diabetic neuropathy, knee arthritis 1 each 0  . nitroGLYCERIN (NITROSTAT) 0.4 MG SL tablet Place 1 tablet (0.4 mg total) under the tongue every 5 (five) minutes as needed for chest pain. x3 doses as needed for chest pain 25 tablet 0  . potassium chloride SA (KLOR-CON) 20 MEQ tablet Take 1 tablet by mouth once daily 90 tablet 0  . carvedilol (COREG) 25 MG tablet Take 1 tablet (25  mg total) by mouth 2 (two) times daily. 180 tablet 3   No current facility-administered medications on file prior to visit.     Allergies  Allergen Reactions  . Lyrica [Pregabalin] Swelling    Caused weight gain  . Amlodipine Swelling  . Sulfonamide Derivatives Rash    Objective: Jessica [name] is a pleasant 86 y.o. y.o. Patient Race: White or Caucasian [1]  female in NAD. AAO x 3.  There were no vitals filed for this visit.  Vascular Examination: Neurovascular status unchanged b/l lower extremities. Capillary fill time to digits <3 seconds b/l lower extremities. Palpable DP pulses b/l. Palpable PT pulses b/l. Pedal hair absent b/l. Skin temperature gradient within normal limits b/l. No pain with calf  compression b/l. No edema noted b/l.  Dermatological Examination: Pedal skin with normal turgor, texture and tone bilaterally. No open wounds bilaterally. No interdigital macerations bilaterally. Toenails 1-5 b/l elongated, discolored, dystrophic, thickened, crumbly with subungual debris and tenderness to dorsal palpation. Porokeratotic lesion(s) subungual R 4th toe with subdermal hemorrhage and extends distally to distal tip of digit. No erythema, no edema, no drainage, no fluctuance. Porokeratotic lesion submet head 4 left foot. No erythema, no edema, no drainage, no fluctuance.  Musculoskeletal: Normal muscle strength 5/5 to all lower extremity muscle groups bilaterally. No pain crepitus or joint limitation noted with ROM b/l. Hammertoes noted to the 2-5 bilaterally. Utilizes rollator for ambulation assistance.  Neurological Examination: Protective sensation decreased with 10 gram monofilament b/l. Vibratory sensation decreased b/l.  Assessment: 1. Pain due to onychomycosis of toenail   2. Porokeratosis   3. Acquired hammertoes of both feet   4. Diabetic peripheral neuropathy associated with type 2 diabetes mellitus ([place])     Plan: -Examined patient. -Continue diabetic foot care principles. -Discussed treatment options. Right 4th toe is somewhat flexible and may be amenable to flexor tenotomy. I will have her see Dr. Lanae Crumbly to determine if this is an option for her. Her daughter agrees.  -Toenails 1-5 b/l were debrided in length and girth with sterile nail nippers and dremel without iatrogenic bleeding.  -Painful preulcerative porokeratotic lesion(s) R 4th toe and submet head 4 left foot pared and enucleated with sterile scalpel blade without incident. -Patient to report any pedal injuries to medical professional immediately. -Dispensed digital toe cap forright 4th toe. Apply every morning. Remove every evening. -Patient to continue soft, supportive shoe gear daily. -Patient/POA  to call should there be question/concern in the interim.  Return in about 9 weeks (around 11/09/2020) for diabetic toenails, corn(s)/callus(es).  Marzetta Board, DPM

## 2020-10-01 ENCOUNTER — Other Ambulatory Visit: Payer: Self-pay | Admitting: Internal Medicine

## 2020-10-05 ENCOUNTER — Ambulatory Visit: Payer: Medicare Other | Admitting: Podiatry

## 2020-10-05 ENCOUNTER — Other Ambulatory Visit: Payer: Self-pay

## 2020-10-05 DIAGNOSIS — M2042 Other hammer toe(s) (acquired), left foot: Secondary | ICD-10-CM | POA: Diagnosis not present

## 2020-10-05 DIAGNOSIS — M2041 Other hammer toe(s) (acquired), right foot: Secondary | ICD-10-CM | POA: Diagnosis not present

## 2020-10-05 DIAGNOSIS — Q828 Other specified congenital malformations of skin: Secondary | ICD-10-CM | POA: Diagnosis not present

## 2020-10-05 DIAGNOSIS — L84 Corns and callosities: Secondary | ICD-10-CM | POA: Diagnosis not present

## 2020-10-05 NOTE — Progress Notes (Signed)
  Subjective:  Patient ID: Jessica [name], female    DOB: 07-21-1927,  MRN: [id number]  Chief Complaint  Patient presents with  . Toe Pain    Pt would like to see if there is an option to help striaghten her toes and help with the pain    86 y.o. female presents with the above complaint. History confirmed with patient.  She is referred to me by Dr. Elisha Ponder for painful hammertoes and calluses on the tips of the toes  Objective:  Physical Exam: warm, good capillary refill, no trophic changes or ulcerative lesions, normal DP and PT pulses and normal sensory exam.  She has semireducible hammertoes bilaterally.  The right fourth and left third are significantly contracted and have calluses on the tips which are painful Assessment:  No diagnosis found.   Plan:  Patient was evaluated and treated and all questions answered.  Hammertoe right fourth and left third toes -Discussed proceeding with flexor tenotomy procedure. Patient agrees to proceed. -Consent form reviewed and signed. -Proceed with flexor tenotomy as below  Procedure: Flexor Tenotomy Indication for Procedure: toe with semi-reducible hammertoe with distal tip ulceration. Flexor tenotomy indicated to alleviate contracture, reduce pressure, and enhance healing of the ulceration. Location:  Anesthesia: Lidocaine 1% plain; 1.5 mL and Marcaine 0.5% plain; 1.5 mL digital block Instrumentation: 18 g needle  Technique: The toe was anesthetized as above and prepped in the usual fashion. The toe was exsanquinated and a tourniquet was secured at the base of the toe. An 18g needle was then used to percutaneously release the flexor tendon at the plantar surface of the toe with noted release of the hammertoe deformity. The incision was then dressed with antibiotic ointment and band-aid. Compression splint dressing applied. Patient tolerated the procedure well. Dressing: Dry, sterile, compression dressing. Disposition: Patient tolerated  procedure well. Patient to return in 3 weeks for follow-up.   Return in about 20 days (around 10/25/2020).

## 2020-10-09 ENCOUNTER — Encounter: Payer: Self-pay | Admitting: Internal Medicine

## 2020-10-09 ENCOUNTER — Ambulatory Visit: Payer: Medicare Other | Admitting: Internal Medicine

## 2020-10-09 ENCOUNTER — Other Ambulatory Visit: Payer: Self-pay

## 2020-10-09 VITALS — BP 146/80 | HR 80 | Temp 97.9°F | Ht 63.0 in | Wt 160.4 lb

## 2020-10-09 DIAGNOSIS — I10 Essential (primary) hypertension: Secondary | ICD-10-CM

## 2020-10-09 DIAGNOSIS — E1140 Type 2 diabetes mellitus with diabetic neuropathy, unspecified: Secondary | ICD-10-CM | POA: Diagnosis not present

## 2020-10-09 DIAGNOSIS — E0842 Diabetes mellitus due to underlying condition with diabetic polyneuropathy: Secondary | ICD-10-CM | POA: Diagnosis not present

## 2020-10-09 DIAGNOSIS — Z23 Encounter for immunization: Secondary | ICD-10-CM

## 2020-10-09 DIAGNOSIS — R600 Localized edema: Secondary | ICD-10-CM

## 2020-10-09 DIAGNOSIS — I5042 Chronic combined systolic (congestive) and diastolic (congestive) heart failure: Secondary | ICD-10-CM | POA: Diagnosis not present

## 2020-10-09 DIAGNOSIS — N1832 Chronic kidney disease, stage 3b: Secondary | ICD-10-CM

## 2020-10-09 DIAGNOSIS — E7800 Pure hypercholesterolemia, unspecified: Secondary | ICD-10-CM

## 2020-10-09 LAB — POCT GLYCOSYLATED HEMOGLOBIN (HGB A1C)
HbA1c POC (<> result, manual entry): 6.2 % (ref 4.0–5.6)
HbA1c, POC (controlled diabetic range): 6.2 % (ref 0.0–7.0)
HbA1c, POC (prediabetic range): 6.2 % (ref 5.7–6.4)
Hemoglobin A1C: 6.2 % — AB (ref 4.0–5.6)

## 2020-10-09 NOTE — Assessment & Plan Note (Signed)
Chronic Stable Reviewed CMP from last month

## 2020-10-09 NOTE — Assessment & Plan Note (Addendum)
Chronic Mild Appears euvolemic Combined systolic and diastolic heart failure currently controlled No change in medication Continue Lasix 40 mg in the morning and 20 mg in the afternoon Continue compression socks, low-sodium diet and elevating legs when sitting

## 2020-10-09 NOTE — Assessment & Plan Note (Signed)
Chronic Currently not on any medication and given her age and other medical problems no need to consider medication at this time

## 2020-10-09 NOTE — Patient Instructions (Addendum)
  Your a1c was checked today.      Medications changes include :   none      Please followup in 6 months

## 2020-10-09 NOTE — Assessment & Plan Note (Signed)
Chronic Bilateral foot pain Overall controlled with gabapentin Continue 300 mg in morning, 300 mg in afternoon and 400 mg at night

## 2020-10-09 NOTE — Progress Notes (Signed)
Subjective:    Patient ID: Jessica [name], female    DOB: 11-24-1926, 86 y.o.   MRN: [id number]  HPI The patient is here for follow up of their chronic medical problems, including Afib, combined HF, htn, DM w/ neuropathy, hyperlipidemia, CKD   When she eats she coughs.  The food gets caught in her throat.  It does not occur daily.  There is no heartburn or nausea.  There is no obvious foods that this occur.  Her and her daughter decided at this time they do not feel that this needs further evaluation.     Sugar today 123, 110 - highest 130's  Medications and allergies reviewed with patient and updated if appropriate.  Patient Active Problem List   Diagnosis Date Noted  . Abdominal bloating 03/09/2020  . Chronic constipation 03/09/2020  . H/O bilateral mastectomy 10/13/2018  . HX: breast cancer 10/13/2018  . Permanent atrial fibrillation ([place]) 10/31/2017  . Cardiac pacemaker in situ   . Chronic combined systolic and diastolic CHF (congestive heart failure) ([place]) 10/12/2017  . UTI (urinary tract infection) 03/28/2017  . Bilateral leg edema 04/29/2016  . CKD (chronic kidney disease) stage 3, GFR 30-59 ml/min (HCC) 06/10/2015  . CAD (coronary artery disease) 06/09/2015  . Diabetic neuropathy ([place]) 07/06/2013  . SSS (sick sinus syndrome) ([place]) 02/10/2013  . Hypertension 07/30/2012  . Pacemaker-Medtronic 07/19/2012  . Benign hypertensive heart disease with heart failure ([place]) 01/28/2012  . Diabetes ([place]) 10/22/2010  . HYPERCHOLESTEROLEMIA 10/22/2010  . DEGENERATIVE JOINT DISEASE 10/22/2010    Current Outpatient Medications on File Prior to Visit  Medication Sig Dispense Refill  . b complex vitamins tablet Take 1 tablet by mouth daily.    . carvedilol (COREG) 25 MG tablet Take 1 tablet (25 mg total) by mouth 2 (two) times daily. 180 tablet 3  . docusate sodium (COLACE) 100 MG capsule Take 100 mg by mouth at bedtime.    Marland Kitchen ECHINACEA PO Take 750 mg by mouth daily.    Marland Kitchen ELIQUIS 5  MG TABS tablet Take 1 tablet by mouth twice daily 180 tablet 1  . fexofenadine (ALLEGRA ALLERGY) 180 MG tablet Take 1 tablet (180 mg total) by mouth daily.    . fish oil-omega-3 fatty acids 1000 MG capsule Take 1 g by mouth every morning.     . furosemide (LASIX) 20 MG tablet TAKE 2 TABLETS BY MOUTH EVERY MORNING AND 1 TABLET EVERY AFTERNOON 270 tablet 2  . gabapentin (NEURONTIN) 100 MG capsule TAKE 1 CAPSULE BY MOUTH NIGHTLY TAKE  IN  ADDITION  TO  300  MG  CAPSULE  AT  NIGHT 270 capsule 1  . gabapentin (NEURONTIN) 300 MG capsule TAKE 1 CAPSULE BY MOUTH THREE TIMES DAILY 270 capsule 1  . lisinopril (ZESTRIL) 5 MG tablet Take 1.5 tablets (7.5 mg total) by mouth daily. 135 tablet 3  . Misc. Devices (ROLLATOR ULTRA-LIGHT) MISC With seat.  Diabetic neuropathy, knee arthritis 1 each 0  . nitroGLYCERIN (NITROSTAT) 0.4 MG SL tablet Place 1 tablet (0.4 mg total) under the tongue every 5 (five) minutes as needed for chest pain. x3 doses as needed for chest pain 25 tablet 0  . potassium chloride SA (KLOR-CON) 20 MEQ tablet Take 1 tablet by mouth once daily 90 tablet 0   No current facility-administered medications on file prior to visit.    Past Medical History:  Diagnosis Date  . Breast cancer ([place])   . CAD (coronary artery disease)  a. s/p DESx2 to mid and distal RCA in 2010.  Marland Kitchen Cholelithiasis 09/12/2013   Lap Chole on 09/14/13   . Chronic combined systolic and diastolic CHF (congestive heart failure) ([place])    a. Echo 06/09/15: EF 55-60% -> 10/2017 EF 30-35% (pt and daughter did not wish to proceed with ischemic assessment - conservative rx).  . CKD (chronic kidney disease), stage III ([place])   . Diabetes mellitus    NON INSULIN DEPENDENT  . DJD (degenerative joint disease)   . GERD (gastroesophageal reflux disease)   . Hemorrhoids   . History of diabetic neuropathy   . Hypercholesterolemia   . Hypertension   . Hypertensive heart disease   . Permanent atrial fibrillation (HCC)    a.  discovered on PPM interrogation 12/15- > eventually progressed to 100% atrial fib burden/permanent atrial fib.  . SSS (sick sinus syndrome) ([place])    a. s/p Medtronic PPM by JA for SSS and syncope 9/11.    Past Surgical History:  Procedure Laterality Date  . CARDIAC CATHETERIZATION  07/05/2009   EF 55-60%  . CARDIOVERSION N/A 06/11/2015   Procedure: CARDIOVERSION;  Surgeon: [name], MD;  Location: [facility] ENDOSCOPY;  Service: Cardiovascular;  Laterality: N/A;  . CHOLECYSTECTOMY N/A 09/14/2013   Procedure: LAPAROSCOPIC CHOLECYSTECTOMY WITH INTRAOPERATIVE CHOLANGIOGRAM;  Surgeon: [name], MD;  Location: [place];  Service: General;  Laterality: N/A;  . COLONOSCOPY    . ERCP N/A 09/13/2013   Procedure: ENDOSCOPIC RETROGRADE CHOLANGIOPANCREATOGRAPHY (ERCP);  Surgeon: [name], MD;  Location[facility] ENDOSCOPY;  Service: Endoscopy;  Laterality: N/A;  . INSERT / REPLACE / REMOVE PACEMAKER  9/11   SSS and syncope, implanted by JA (MDT)  . MASTECTOMY  1992   BILATERAL WITH RECONSTRUCTION    Social History   Socioeconomic History  . Marital status: Widowed    Spouse name: Not on file  . Number of children: Not on file  . Years of education: Not on file  . Highest education level: Not on file  Occupational History  . Not on file  Tobacco Use  . Smoking status: Never Smoker  . Smokeless tobacco: Never Used  Vaping Use  . Vaping Use: Never used  Substance and Sexual Activity  . Alcohol use: No    Alcohol/week: 0.0 standard drinks  . Drug use: No  . Sexual activity: Not on file  Other Topics Concern  . Not on file  Social History Narrative  . Not on file   Social Determinants of Health   Financial Resource Strain:   . Difficulty of Paying Living Expenses: Not on file  Food Insecurity:   . Worried About Charity fundraiser in the Last Year: Not on file  . Ran Out of Food in the Last Year: Not on file  Transportation Needs:   . Lack of Transportation (Medical): Not on  file  . Lack of Transportation (Non-Medical): Not on file  Physical Activity:   . Days of Exercise per Week: Not on file  . Minutes of Exercise per Session: Not on file  Stress:   . Feeling of Stress : Not on file  Social Connections:   . Frequency of Communication with Friends and Family: Not on file  . Frequency of Social Gatherings with Friends and Family: Not on file  . Attends Religious Services: Not on file  . Active Member of Clubs or Organizations: Not on file  . Attends Archivist Meetings: Not on file  . Marital  Status: Not on file    Family History  Problem Relation Age of Onset  . Cancer Mother   . Cancer Father     Review of Systems  Constitutional: Negative for fever.  HENT: Positive for trouble swallowing.   Respiratory: Negative for cough, shortness of breath and wheezing.   Cardiovascular: Positive for leg swelling. Negative for chest pain and palpitations.  Neurological: Positive for light-headedness. Negative for headaches.       Objective:   Vitals:   10/09/20 1406  BP: (!) 146/80  Pulse: 80  Temp: 97.9 F (36.6 C)  SpO2: 95%   BP Readings from Last 3 Encounters:  10/09/20 (!) 146/80  09/03/20 (!) 148/88  04/18/20 (!) 142/80   Wt Readings from Last 3 Encounters:  10/09/20 160 lb 6.4 oz (72.8 kg)  09/03/20 157 lb 12.8 oz (71.6 kg)  04/18/20 158 lb (71.7 kg)   Body mass index is 28.41 kg/m.   Physical Exam    Constitutional: Appears well-developed and well-nourished. No distress.  HENT:  Head: Normocephalic and atraumatic.  Neck: Neck supple. No tracheal deviation present. No thyromegaly present.  No cervical lymphadenopathy Cardiovascular: Normal rate, regular rhythm and normal heart sounds.   No murmur heard. No carotid bruit .  Mild bilateral lower extremity edema Pulmonary/Chest: Effort normal and breath sounds normal. No respiratory distress. No has no wheezes. No rales.  Skin: Skin is warm and dry. Not diaphoretic.    Psychiatric: Normal mood and affect. Behavior is normal.      Assessment & Plan:    See Problem List for Assessment and Plan of chronic medical problems.    This visit occurred during the SARS-CoV-2 public health emergency.  Safety protocols were in place, including screening questions prior to the visit, additional usage of staff PPE, and extensive cleaning of exam room while observing appropriate contact time as indicated for disinfecting solutions.

## 2020-10-09 NOTE — Assessment & Plan Note (Signed)
Chronic Blood pressure controlled for her, especially given her age Continue carvedilol 25 mg twice daily, Lasix 40 mg in morning and 20 mg in afternoon and lisinopril 7.5 mg daily CMP reviewed from last month

## 2020-10-09 NOTE — Assessment & Plan Note (Signed)
Chronic Diet controlled A1c here today very well controlled  Lab Results  Component Value Date   HGBA1C 6.2 (A) 10/09/2020   HGBA1C 6.2 10/09/2020   HGBA1C 6.2 10/09/2020   HGBA1C 6.2 10/09/2020

## 2020-10-09 NOTE — Assessment & Plan Note (Signed)
Chronic Following with cardiology Euvolemic here today No need to change medications

## 2020-10-10 ENCOUNTER — Ambulatory Visit (INDEPENDENT_AMBULATORY_CARE_PROVIDER_SITE_OTHER): Payer: Medicare Other

## 2020-10-10 DIAGNOSIS — I4821 Permanent atrial fibrillation: Secondary | ICD-10-CM

## 2020-10-10 DIAGNOSIS — I495 Sick sinus syndrome: Secondary | ICD-10-CM

## 2020-10-11 LAB — CUP PACEART REMOTE DEVICE CHECK
Battery Impedance: 1653 Ohm
Battery Remaining Longevity: 41 mo
Battery Voltage: 2.76 V
Brady Statistic AP VP Percent: 5 %
Brady Statistic AP VS Percent: 0 %
Brady Statistic AS VP Percent: 16 %
Brady Statistic AS VS Percent: 79 %
Date Time Interrogation Session: 20211208160611
Implantable Lead Implant Date: 20110912
Implantable Lead Implant Date: 20110912
Implantable Lead Location: 753859
Implantable Lead Location: 753860
Implantable Lead Model: 5076
Implantable Lead Model: 5092
Implantable Pulse Generator Implant Date: 20110912
Lead Channel Impedance Value: 502 Ohm
Lead Channel Impedance Value: 642 Ohm
Lead Channel Pacing Threshold Amplitude: 0.5 V
Lead Channel Pacing Threshold Amplitude: 0.75 V
Lead Channel Pacing Threshold Pulse Width: 0.4 ms
Lead Channel Pacing Threshold Pulse Width: 0.4 ms
Lead Channel Setting Pacing Amplitude: 2 V
Lead Channel Setting Pacing Amplitude: 2.5 V
Lead Channel Setting Pacing Pulse Width: 0.4 ms
Lead Channel Setting Sensing Sensitivity: 2 mV

## 2020-10-14 NOTE — Progress Notes (Signed)
Cardiology Office Note Date:  10/15/2020  Patient ID:  Jessica, [name] 12-27-1926, MRN [id number] PCP:  Binnie Rail, MD  Cardiologist:  Dr. [name] Electrophysiologist: Dr. [name]    Chief Complaint: annual EP visit  History of Present Illness: Jessica [name] is a 86 y.o. female with history of CAD (DESx2 to mid and distal RCA in 2010), chronic CHF (combinde), HTN, DM, peripheral neuropathy, HLD, permanent AFib, CKD (III), p.HTN, tachy-brady w/PPM.  She comes in today to be seen for Dr. [name], last seen by him via tele heath visit Nov 2020, her daughter aided the visit, no complaints noted, no changes were made.  Most recently she saw Dr. [name], Nov 2021, ambulating with walker, sleeping more, though otherwise doing well. Found to be euvolemic, planned for labs.  BP was high though given h/o orthostatic hypotension, no changes made.  Saw Dr. Quay Burow 10/09/20, no changes   TODAY She comes today accompanied by her daughter. She is doing pretty well, mostly limited by feet pain 2/2 hammer toe. She is seeing Dr. Sherryle Lis w/[facility] and has been getting some improvement in her toes and less pain. Outside of this they both state that she is doing well. The patient denies any cardiac concerns, no CP, palpitations Denies any rest SOB, no symptoms of PND or orthopnea, no DOE at her level of exertion. Will have a slight dizziness when she first sits up in the AM< will wait a minute or two prior to standing up. No near syncope or syncope.  No bleeding or signs of bleeding Tolerating her medicines well.  Device information: MDT dual chamber PPM, implanted 07/15/10   Past Medical History:  Diagnosis Date   Breast cancer ([place])    CAD (coronary artery disease)    a. s/p DESx2 to mid and distal RCA in 2010.   Cholelithiasis 09/12/2013   Lap Chole on 09/14/13    Chronic combined systolic and diastolic CHF (congestive heart failure) ([place])    a. Echo 06/09/15: EF 55-60% -> 10/2017  EF 30-35% (pt and daughter did not wish to proceed with ischemic assessment - conservative rx).   CKD (chronic kidney disease), stage III (HCC)    Diabetes mellitus    NON INSULIN DEPENDENT   DJD (degenerative joint disease)    GERD (gastroesophageal reflux disease)    Hemorrhoids    History of diabetic neuropathy    Hypercholesterolemia    Hypertension    Hypertensive heart disease    Permanent atrial fibrillation ([place])    a. discovered on PPM interrogation 12/15- > eventually progressed to 100% atrial fib burden/permanent atrial fib.   SSS (sick sinus syndrome) (HCC)    a. s/p Medtronic PPM by JA for SSS and syncope 9/11.    Past Surgical History:  Procedure Laterality Date   CARDIAC CATHETERIZATION  07/05/2009   EF 55-60%   CARDIOVERSION N/A 06/11/2015   Procedure: CARDIOVERSION;  Surgeon: [name], MD;  Location: [place];  Service: Cardiovascular;  Laterality: N/A;   CHOLECYSTECTOMY N/A 09/14/2013   Procedure: LAPAROSCOPIC CHOLECYSTECTOMY WITH INTRAOPERATIVE CHOLANGIOGRAM;  Surgeon: [name], MD;  Location: [place];  Service: General;  Laterality: N/A;   COLONOSCOPY     ERCP N/A 09/13/2013   Procedure: ENDOSCOPIC RETROGRADE CHOLANGIOPANCREATOGRAPHY (ERCP);  Surgeon: [name], MD;  [facility] END[facility]COPY;  Service: Endoscopy;  Laterality: N/A;   INSERT / REPLACE / REMOVE PACEMAKER  9/11   SSS and syncope, implanted by JA (MDT)  MASTECTOMY  1992   BILATERAL WITH RECONSTRUCTION    Current Outpatient Medications  Medication Sig Dispense Refill   b complex vitamins tablet Take 1 tablet by mouth daily.     carvedilol (COREG) 25 MG tablet Take 25 mg by mouth 2 (two) times daily with a meal.     docusate sodium (COLACE) 100 MG capsule Take 100 mg by mouth at bedtime.     ECHINACEA PO Take 750 mg by mouth daily.     ELIQUIS 5 MG TABS tablet Take 1 tablet by mouth twice daily 180 tablet 1   fexofenadine (ALLEGRA ALLERGY) 180 MG tablet  Take 1 tablet (180 mg total) by mouth daily.     fish oil-omega-3 fatty acids 1000 MG capsule Take 1 g by mouth every morning.     furosemide (LASIX) 20 MG tablet TAKE 2 TABLETS BY MOUTH EVERY MORNING AND 1 TABLET EVERY AFTERNOON 270 tablet 2   gabapentin (NEURONTIN) 100 MG capsule TAKE 1 CAPSULE BY MOUTH NIGHTLY TAKE  IN  ADDITION  TO  300  MG  CAPSULE  AT  NIGHT 270 capsule 1   gabapentin (NEURONTIN) 300 MG capsule TAKE 1 CAPSULE BY MOUTH THREE TIMES DAILY 270 capsule 1   lisinopril (ZESTRIL) 5 MG tablet Take 1.5 tablets (7.5 mg total) by mouth daily. 135 tablet 3   Misc. Devices (ROLLATOR ULTRA-LIGHT) MISC With seat.  Diabetic neuropathy, knee arthritis 1 each 0   nitroGLYCERIN (NITROSTAT) 0.4 MG SL tablet Place 1 tablet (0.4 mg total) under the tongue every 5 (five) minutes as needed for chest pain. x3 doses as needed for chest pain 25 tablet 0   potassium chloride SA (KLOR-CON) 20 MEQ tablet Take 1 tablet by mouth once daily 90 tablet 0   No current facility-administered medications for this visit.    Allergies:   Lyrica [pregabalin], Amlodipine, and Sulfonamide derivatives   Social History:  The patient  reports that she has never smoked. She has never used smokeless tobacco. She reports that she does not drink alcohol and does not use drugs.   Family History:  The patient's family history includes Cancer in her father and mother.  ROS:  Please see the history of present illness.  All other systems are reviewed and otherwise negative.   PHYSICAL EXAM:  VS:  BP 118/80    Pulse 86    Ht 5\' 3"  (1.6 m)    Wt 160 lb 12.8 oz (72.9 kg)    LMP  (LMP Unknown)    SpO2 98%    BMI 28.48 kg/m  BMI: Body mass index is 28.48 kg/m. Well nourished, well developed, in no acute distress  HEENT: normocephalic, atraumatic  Neck: no JVD, carotid bruits or masses Cardiac:  irreg-irreg; no significant murmurs, no rubs, or gallops Lungs:   CTA b/l, no wheezing, rhonchi or rales  Abd: soft,  nontender MS: no deformity, age appropriate atrophy Ext: trace edema  Skin: warm and dry, no rash Neuro:  No gross deficits appreciated Psych: euthymic mood, full affect  PPM site is stable, no tethering or discomfort   EKG: not done today   PPM interrogation doe today and reviewed by myself;  Battery and lead measurements are OK AF burden notes a period of ? SR, though currently in an episode of >96hr duration No HVR   11/01/17: TTE Study Conclusions - Procedure narrative: Transthoracic echocardiography. Image   quality was poor. The study was technically difficult, as a   result of poor  acoustic windows, poor sound wave transmission,   chest wall deformity, and body habitus. - Left ventricle: The cavity size was normal. Wall thickness was   increased in a pattern of moderate LVH. Systolic function was   moderately to severely reduced. The estimated ejection fraction   was in the range of 30% to 35%. Moderate diffuse hypokinesis. - Aortic valve: There was mild regurgitation. - Mitral valve: Mildly calcified annulus. There was mild   regurgitation. - Pulmonary arteries: Systolic pressure was mildly increased. PA   peak pressure: 40 mm Hg (S).  Recent Labs: 09/03/2020: ALT 13; BUN 23; Creatinine, Ser 1.13; Hemoglobin 13.2; NT-Pro BNP 4,231; Platelets 223; Potassium 4.4; Sodium 146; TSH 4.270  10/19/2019: Cholesterol 129; HDL 41.60; LDL Cholesterol 73; Total CHOL/HDL Ratio 3; Triglycerides 75.0; VLDL 15.0   CrCl cannot be calculated (Patient's most recent lab result is older than the maximum 21 days allowed.).   Wt Readings from Last 3 Encounters:  10/15/20 160 lb 12.8 oz (72.9 kg)  10/09/20 160 lb 6.4 oz (72.8 kg)  09/03/20 157 lb 12.8 oz (71.6 kg)     Other studies reviewed: Additional studies/records reviewed today include: summarized above  ASSESSMENT AND PLAN:  1. PPM     Intact function     No programming changes made  2. Chronic CHF (systolic), CM     On  BB/ACE, lasix     No symptoms of volume OL, trace edema onlynoted      3. Longstanding persistent Afib     CHA2DS2Vasc is 6, on Eliquis,  appropriately dosed for weight and Creat  Looks to have a gap in AF Oct 19-Dec, though EKG done by Dr. [name] during that time noted AFib I suspect she is permanent Afib    4. CAD     No anginal sounding symptoms     On BB, statin, no ASA w/eliquis     Continue with Dr. [name]   Disposition:  No changes, continue remotes Q13mo, back in clinic with EP in a year, sooner if needed   Current medicines are reviewed at length with the patient today.  The patient did not have any concerns regarding medicines.  Venetia Night, PA-C 10/15/2020 3:48 PM     [address] [place] [address] [phone] (office)  [phone] (fax)

## 2020-10-15 ENCOUNTER — Other Ambulatory Visit: Payer: Self-pay

## 2020-10-15 ENCOUNTER — Encounter: Payer: Self-pay | Admitting: Physician Assistant

## 2020-10-15 ENCOUNTER — Ambulatory Visit: Payer: Medicare Other | Admitting: Physician Assistant

## 2020-10-15 VITALS — BP 118/80 | HR 86 | Ht 63.0 in | Wt 160.8 lb

## 2020-10-15 DIAGNOSIS — Z950 Presence of cardiac pacemaker: Secondary | ICD-10-CM | POA: Diagnosis not present

## 2020-10-15 DIAGNOSIS — I5042 Chronic combined systolic (congestive) and diastolic (congestive) heart failure: Secondary | ICD-10-CM

## 2020-10-15 DIAGNOSIS — I4811 Longstanding persistent atrial fibrillation: Secondary | ICD-10-CM | POA: Diagnosis not present

## 2020-10-15 DIAGNOSIS — I5022 Chronic systolic (congestive) heart failure: Secondary | ICD-10-CM | POA: Diagnosis not present

## 2020-10-15 DIAGNOSIS — I2510 Atherosclerotic heart disease of native coronary artery without angina pectoris: Secondary | ICD-10-CM

## 2020-10-15 NOTE — Patient Instructions (Signed)

## 2020-10-19 ENCOUNTER — Ambulatory Visit: Payer: Medicare Other | Admitting: Internal Medicine

## 2020-10-23 NOTE — Progress Notes (Signed)
Remote pacemaker transmission.   

## 2020-10-25 ENCOUNTER — Ambulatory Visit: Payer: Medicare Other | Admitting: Podiatry

## 2020-10-25 ENCOUNTER — Other Ambulatory Visit: Payer: Self-pay

## 2020-10-25 ENCOUNTER — Other Ambulatory Visit: Payer: Self-pay | Admitting: Internal Medicine

## 2020-10-25 DIAGNOSIS — M2041 Other hammer toe(s) (acquired), right foot: Secondary | ICD-10-CM

## 2020-10-25 DIAGNOSIS — Z9889 Other specified postprocedural states: Secondary | ICD-10-CM

## 2020-10-25 DIAGNOSIS — M2042 Other hammer toe(s) (acquired), left foot: Secondary | ICD-10-CM

## 2020-10-25 NOTE — Patient Instructions (Signed)
Walk as much as you can over the next 10 days and see how your toes feel. Keep track of which toes cause pain when you walk. When you see me again we will release the tendons that are causing pain on those toes

## 2020-10-25 NOTE — Progress Notes (Signed)
  Subjective:  Patient ID: Jessica [name], female    DOB: 1927/09/08,  MRN: [id number]  Chief Complaint  Patient presents with  . Foot Pain    Pt stated that she is doing better she has no concnerns.    86 y.o. female returns for follow-up after flexor tenotomy's.  Here with her daughter.  She feels much better on the toes that were operated on.  Objective:  Physical Exam: warm, good capillary refill, no trophic changes or ulcerative lesions, normal DP and PT pulses and normal sensory exam.  She has semireducible hammertoes bilaterally.  Right fourth and left third have straightened and the calluses have softened Assessment:  No diagnosis found.   Plan:  Patient was evaluated and treated and all questions answered.  -Healing well status post flexor tenotomy of right fourth and left third toes -Her daughter inquires if we can straighten the rest of them.  I think would be good to allow these to heal more.  I asked him to keep a diary or journal of which other toes are painful.  It certainly possible that we could do all of them but may want to stage them instead of doing them all at once to make recover easier.  She will let Dr. Elisha Ponder know at her next routine care appointment how these are doing and then see me back for flexor tenotomy's after that.  She currently does not have any preulcerative lesions on the other toes but if this would improve her pain and help straighten them out then I think that would be beneficial for her   Return in about 1 month (around 11/25/2020).

## 2020-11-05 ENCOUNTER — Ambulatory Visit: Payer: Medicare Other | Admitting: Podiatry

## 2020-11-06 ENCOUNTER — Other Ambulatory Visit: Payer: Self-pay

## 2020-11-06 ENCOUNTER — Ambulatory Visit (INDEPENDENT_AMBULATORY_CARE_PROVIDER_SITE_OTHER): Payer: Medicare Other | Admitting: Podiatry

## 2020-11-06 ENCOUNTER — Encounter: Payer: Self-pay | Admitting: Podiatry

## 2020-11-06 DIAGNOSIS — E1142 Type 2 diabetes mellitus with diabetic polyneuropathy: Secondary | ICD-10-CM

## 2020-11-06 DIAGNOSIS — B351 Tinea unguium: Secondary | ICD-10-CM | POA: Diagnosis not present

## 2020-11-06 DIAGNOSIS — Q828 Other specified congenital malformations of skin: Secondary | ICD-10-CM

## 2020-11-06 DIAGNOSIS — L84 Corns and callosities: Secondary | ICD-10-CM

## 2020-11-06 DIAGNOSIS — M79676 Pain in unspecified toe(s): Secondary | ICD-10-CM | POA: Diagnosis not present

## 2020-11-07 ENCOUNTER — Other Ambulatory Visit: Payer: Self-pay | Admitting: Internal Medicine

## 2020-11-07 DIAGNOSIS — I5042 Chronic combined systolic (congestive) and diastolic (congestive) heart failure: Secondary | ICD-10-CM

## 2020-11-07 DIAGNOSIS — I5033 Acute on chronic diastolic (congestive) heart failure: Secondary | ICD-10-CM

## 2020-11-07 NOTE — Telephone Encounter (Signed)
Prescription refill request for Eliquis received.  Indication: Afib  Last office visit: Keitha [place], 10/15/2020 Scr: 1.13, 09/03/2020 Age: 87 yo Weight: 72.9 kg   Prescription refill sent.

## 2020-11-08 NOTE — Progress Notes (Signed)
Subjective: ALLERIA BEERMAN presents today at risk foot care with history of diabetic neuropathy and painful porokeratotic lesion(s) b/l and painful mycotic toenails b/l that limit ambulation. Aggravating factors include weightbearing with and without shoe gear. Pain for both is relieved with periodic professional debridement.  Binnie Rail, MD is patient's PCP. Last visit was: 04/18/2020.  Her daughter is present during today's visit. She states Ms. Choma did have flexor tenotomies of right 4th and left 3rd digits performed by Dr. Sherryle Lis. She is very pleased with outcome and desires to have right 2nd and 3rd digits corrected as well. Daughter states her mother is able to ambulate pain-free and walks more now.  Past Medical History:  Diagnosis Date  . Breast cancer ([place])   . CAD (coronary artery disease)    a. s/p DESx2 to mid and distal RCA in 2010.  Marland Kitchen Cholelithiasis 09/12/2013   Lap Chole on 09/14/13   . Chronic combined systolic and diastolic CHF (congestive heart failure) ([place])    a. Echo 06/09/15: EF 55-60% -> 10/2017 EF 30-35% (pt and daughter did not wish to proceed with ischemic assessment - conservative rx).  . CKD (chronic kidney disease), stage III ([place])   . Diabetes mellitus    NON INSULIN DEPENDENT  . DJD (degenerative joint disease)   . GERD (gastroesophageal reflux disease)   . Hemorrhoids   . History of diabetic neuropathy   . Hypercholesterolemia   . Hypertension   . Hypertensive heart disease   . Permanent atrial fibrillation (HCC)    a. discovered on PPM interrogation 12/15- > eventually progressed to 100% atrial fib burden/permanent atrial fib.  . SSS (sick sinus syndrome) ([place])    a. s/p Medtronic PPM by JA for SSS and syncope 9/11.     Current Outpatient Medications on File Prior to Visit  Medication Sig Dispense Refill  . b complex vitamins tablet Take 1 tablet by mouth daily.    . carvedilol (COREG) 25 MG tablet Take 25 mg by mouth 2 (two) times daily with  a meal.    . docusate sodium (COLACE) 100 MG capsule Take 100 mg by mouth at bedtime.    Marland Kitchen ECHINACEA PO Take 750 mg by mouth daily.    . fexofenadine (ALLEGRA ALLERGY) 180 MG tablet Take 1 tablet (180 mg total) by mouth daily.    . fish oil-omega-3 fatty acids 1000 MG capsule Take 1 g by mouth every morning.    . furosemide (LASIX) 20 MG tablet TAKE 2 TABLETS BY MOUTH EVERY MORNING AND 1 TABLET EVERY AFTERNOON 270 tablet 2  . gabapentin (NEURONTIN) 100 MG capsule TAKE 1 CAPSULE BY MOUTH NIGHTLY TAKE  IN  ADDITION  TO  300  MG  CAPSULE  AT  NIGHT 270 capsule 1  . gabapentin (NEURONTIN) 300 MG capsule TAKE 1 CAPSULE BY MOUTH THREE TIMES DAILY 270 capsule 1  . lisinopril (ZESTRIL) 5 MG tablet Take 1.5 tablets (7.5 mg total) by mouth daily. 135 tablet 3  . Misc. Devices (ROLLATOR ULTRA-LIGHT) MISC With seat.  Diabetic neuropathy, knee arthritis 1 each 0  . nitroGLYCERIN (NITROSTAT) 0.4 MG SL tablet Place 1 tablet (0.4 mg total) under the tongue every 5 (five) minutes as needed for chest pain. x3 doses as needed for chest pain 25 tablet 0  . potassium chloride SA (KLOR-CON) 20 MEQ tablet Take 1 tablet by mouth once daily 90 tablet 0   No current facility-administered medications on file prior to visit.  Allergies  Allergen Reactions  . Lyrica [Pregabalin] Swelling    Caused weight gain  . Amlodipine Swelling  . Sulfonamide Derivatives Rash    Objective: NATAJAH DERDERIAN is a pleasant 87 y.o. y.o. Patient Race: White or Caucasian [1]  female in NAD. AAO x 3.  There were no vitals filed for this visit.  Vascular Examination: Neurovascular status unchanged b/l lower extremities. Capillary fill time to digits <3 seconds b/l lower extremities. Palpable DP pulses b/l. Palpable PT pulses b/l. Pedal hair absent b/l. Skin temperature gradient within normal limits b/l. No pain with calf compression b/l. No edema noted b/l.  Dermatological Examination: Pedal skin with normal turgor, texture and  tone bilaterally. No open wounds bilaterally. No interdigital macerations bilaterally. Toenails 1-5 b/l elongated, discolored, dystrophic, thickened, crumbly with subungual debris and tenderness to dorsal palpation. Porokeratotic lesion(s) subungual R 4th toe with minimal subdermal hemorrhage and extends distally to distal tip of digit. No erythema, no edema, no drainage, no fluctuance. Porokeratotic lesion submet head 4 left foot. No erythema, no edema, no drainage, no fluctuance.  Musculoskeletal: Normal muscle strength 5/5 to all lower extremity muscle groups bilaterally. No pain crepitus or joint limitation noted with ROM b/l. Hammertoes noted to the 2-5 bilaterally. Utilizes rollator for ambulation assistance.  Neurological Examination: Protective sensation decreased with 10 gram monofilament b/l. Vibratory sensation decreased b/l.  Assessment: 1. Pain due to onychomycosis of toenail   2. Porokeratosis   3. Corns   4. Diabetic peripheral neuropathy associated with type 2 diabetes mellitus (HCC)     Plan: -Examined patient. -Continue diabetic foot care principles. -Toenails 1-5 b/l were debrided in length and girth with sterile nail nippers and dremel without iatrogenic bleeding.  -Porokeratotic lesion(s) R 4th toe and submet head 4 left foot pared and enucleated with sterile scalpel blade without incident. -Patient to report any pedal injuries to medical professional immediately. -Will have Dr. Lilian Kapur contact daughter, Teresa [place] at [phone], to discuss future plans for Ms. Whittington. -Patient to continue soft, supportive shoe gear daily. -Patient/POA to call should there be question/concern in the interim.  Return in about 9 weeks (around 01/08/2021).  Freddie Breech, DPM

## 2020-11-12 NOTE — Progress Notes (Signed)
Hi when you have a chance would be able to call Ms Ledyard's daughter and have her scheduled to see me for procedures on her toes? If it's possible to put her at the end of a session to do it that would be best. Thanks!

## 2020-11-14 ENCOUNTER — Telehealth: Payer: Self-pay | Admitting: *Deleted

## 2020-11-14 NOTE — Telephone Encounter (Signed)
Daughter (Teresa)of patient is calling to follow up on appointment to see Dr Sherryle Lis for tendon release. Please advise.  Patient has been scheduled for Jan.20th at 4:00 with Dr Sherryle Lis per his note in Epic  and has been confirmed by daughter

## 2020-11-14 NOTE — Telephone Encounter (Signed)
error 

## 2020-11-22 ENCOUNTER — Ambulatory Visit: Payer: Medicare Other | Admitting: Podiatry

## 2020-11-22 ENCOUNTER — Other Ambulatory Visit: Payer: Self-pay

## 2020-11-22 DIAGNOSIS — M2041 Other hammer toe(s) (acquired), right foot: Secondary | ICD-10-CM | POA: Diagnosis not present

## 2020-11-25 ENCOUNTER — Encounter: Payer: Self-pay | Admitting: Podiatry

## 2020-11-25 NOTE — Progress Notes (Signed)
  Subjective:  Patient ID: Jessica [name], female    DOB: 1927/02/19,  MRN: [id number]  Chief Complaint  Patient presents with  . tendon release    Pt is here for tendon release consult     87 y.o. female returns with the above complaint. History confirmed with patient.  The previously treated toes are doing quite well.  They have been monitoring her symptoms on the other toes and would like the right second and third toes done.  She is here with her daughter.  Objective:  Physical Exam: warm, good capillary refill, no trophic changes or ulcerative lesions, normal DP and PT pulses and normal sensory exam.  She has semireducible hammertoes bilaterally.  The right fourth and left third are significantly contracted and have calluses on the tips which are painful Assessment:   1. Hammertoe of right foot      Plan:  Patient was evaluated and treated and all questions answered.  Hammertoe right second and third toes -Discussed proceeding with flexor tenotomy procedure. Patient agrees to proceed. -Consent form reviewed and signed. -Proceed with flexor tenotomy as below  Procedure: Flexor Tenotomy Indication for Procedure: toe with semi-reducible hammertoe with distal tip ulceration. Flexor tenotomy indicated to alleviate contracture, reduce pressure, and enhance healing of the ulceration. Location: Right second and third toes Anesthesia: Lidocaine 1% plain; 1.5 mL and Marcaine 0.5% plain; 1.5 mL digital block Instrumentation: 18 g needle  Technique: The toe was anesthetized as above and prepped in the usual fashion. The toe was exsanquinated and a tourniquet was secured at the base of the toe. An 18g needle was then used to percutaneously release the flexor tendon at the plantar surface of the toe with noted release of the hammertoe deformity. The incision was then dressed with antibiotic ointment and band-aid. Compression splint dressing applied.  The identical procedure was performed on  the third toe as well.  Patient tolerated the procedure well. Dressing: Dry, sterile, compression dressing. Disposition: Patient tolerated procedure well. Patient to return in 3 weeks for follow-up.   Return in about 3 weeks (around 12/13/2020).

## 2020-12-10 ENCOUNTER — Other Ambulatory Visit: Payer: Self-pay | Admitting: Cardiology

## 2020-12-10 ENCOUNTER — Other Ambulatory Visit: Payer: Self-pay | Admitting: Physician Assistant

## 2020-12-17 ENCOUNTER — Ambulatory Visit: Payer: Medicare Other | Admitting: Podiatry

## 2020-12-17 ENCOUNTER — Other Ambulatory Visit: Payer: Self-pay

## 2020-12-17 DIAGNOSIS — Z9889 Other specified postprocedural states: Secondary | ICD-10-CM

## 2020-12-17 DIAGNOSIS — M2041 Other hammer toe(s) (acquired), right foot: Secondary | ICD-10-CM

## 2020-12-18 ENCOUNTER — Encounter: Payer: Self-pay | Admitting: Podiatry

## 2020-12-18 NOTE — Progress Notes (Signed)
  Subjective:  Patient ID: Jessica [name], female    DOB: 1926/11/15,  MRN: [id number]  Chief Complaint  Patient presents with  . Hammer Toe    3 week follow up from tendon release. PT stated that her foot is doing a lot better than it was. She denies any pain at this time.    87 y.o. female returns with the above complaint. History confirmed with patient.  Here with her daughter. Her toes feel great and she has no pain  Objective:  Physical Exam: Flexor tenotomy incisions well healed  Assessment:   1. Hammertoe of right foot   2. Post-operative state      Plan:  Patient was evaluated and treated and all questions answered.  Hammertoe right second and third toes -Resume regular shoe gear and activity -Return to me PRN for further issues if other toes bother her -She healed very nicely and this should improve her ambulation and quality of life   Return if symptoms worsen or fail to improve.

## 2021-01-09 ENCOUNTER — Ambulatory Visit (INDEPENDENT_AMBULATORY_CARE_PROVIDER_SITE_OTHER): Payer: Medicare Other

## 2021-01-09 DIAGNOSIS — I495 Sick sinus syndrome: Secondary | ICD-10-CM

## 2021-01-11 LAB — CUP PACEART REMOTE DEVICE CHECK
Battery Impedance: 1876 Ohm
Battery Remaining Longevity: 38 mo
Battery Voltage: 2.76 V
Brady Statistic AP VP Percent: 0 %
Brady Statistic AP VS Percent: 0 %
Brady Statistic AS VP Percent: 39 %
Brady Statistic AS VS Percent: 61 %
Date Time Interrogation Session: 20220309171641
Implantable Lead Implant Date: 20110912
Implantable Lead Implant Date: 20110912
Implantable Lead Location: 753859
Implantable Lead Location: 753860
Implantable Lead Model: 5076
Implantable Lead Model: 5092
Implantable Pulse Generator Implant Date: 20110912
Lead Channel Impedance Value: 510 Ohm
Lead Channel Impedance Value: 671 Ohm
Lead Channel Pacing Threshold Amplitude: 0.5 V
Lead Channel Pacing Threshold Amplitude: 0.75 V
Lead Channel Pacing Threshold Pulse Width: 0.4 ms
Lead Channel Pacing Threshold Pulse Width: 0.4 ms
Lead Channel Setting Pacing Amplitude: 2 V
Lead Channel Setting Pacing Amplitude: 2.5 V
Lead Channel Setting Pacing Pulse Width: 0.4 ms
Lead Channel Setting Sensing Sensitivity: 2 mV

## 2021-01-17 NOTE — Progress Notes (Signed)
Remote pacemaker transmission.   

## 2021-01-28 ENCOUNTER — Other Ambulatory Visit: Payer: Self-pay | Admitting: Internal Medicine

## 2021-01-28 ENCOUNTER — Other Ambulatory Visit: Payer: Self-pay | Admitting: Physician Assistant

## 2021-02-05 ENCOUNTER — Ambulatory Visit: Payer: Medicare Other | Admitting: Podiatry

## 2021-02-05 ENCOUNTER — Other Ambulatory Visit: Payer: Self-pay

## 2021-02-05 ENCOUNTER — Encounter: Payer: Self-pay | Admitting: Podiatry

## 2021-02-05 DIAGNOSIS — M79676 Pain in unspecified toe(s): Secondary | ICD-10-CM

## 2021-02-05 DIAGNOSIS — M2042 Other hammer toe(s) (acquired), left foot: Secondary | ICD-10-CM

## 2021-02-05 DIAGNOSIS — E1142 Type 2 diabetes mellitus with diabetic polyneuropathy: Secondary | ICD-10-CM

## 2021-02-05 DIAGNOSIS — B351 Tinea unguium: Secondary | ICD-10-CM

## 2021-02-05 DIAGNOSIS — M2041 Other hammer toe(s) (acquired), right foot: Secondary | ICD-10-CM

## 2021-02-10 NOTE — Progress Notes (Signed)
Subjective: Jessica [name] presents today at risk foot care with history of diabetic neuropathy and painful porokeratotic lesion(s) b/l and painful mycotic toenails b/l that limit ambulation. Aggravating factors include weightbearing with and without shoe gear. Pain for both is relieved with periodic professional debridement.   Patient's blood glucose was 134 mg/dl this morning.  Binnie Rail, MD is patient's PCP. Last visit was: 10/09/2020  Her daughter is present during today's visit. They state they are pleased with flexor tenotomies performed by Dr. Sherryle Lis. Daughter states [name] is now able to walk in [employer] pain-free.  Allergies  Allergen Reactions  . Lyrica [Pregabalin] Swelling    Caused weight gain  . Amlodipine Swelling  . Sulfonamide Derivatives Rash   Objective: Jessica [name] is a pleasant 87 y.o. y.o. White or Caucasian [1]  female in NAD. AAO x 3.  There were no vitals filed for this visit.  Vascular Examination: Neurovascular status unchanged b/l lower extremities. Capillary fill time to digits <3 seconds b/l lower extremities. Palpable DP pulses b/l. Palpable PT pulses b/l. Pedal hair absent b/l. Skin temperature gradient within normal limits b/l. No pain with calf compression b/l. No edema noted b/l.  Dermatological Examination: Pedal skin with normal turgor, texture and tone bilaterally. No open wounds bilaterally. No interdigital macerations bilaterally. Toenails 1-5 b/l elongated, discolored, dystrophic, thickened, crumbly with subungual debris and tenderness to dorsal palpation.   Musculoskeletal: Normal muscle strength 5/5 to all lower extremity muscle groups bilaterally. No pain crepitus or joint limitation noted with ROM b/l. Hammertoes noted to the 2-5 bilaterally. Utilizes rollator for ambulation assistance.  Neurological Examination: Protective sensation decreased with 10 gram monofilament b/l. Vibratory sensation decreased b/l.  Assessment: 1.  Pain due to onychomycosis of toenail   2. Acquired hammertoes of both feet   3. Diabetic peripheral neuropathy associated with type 2 diabetes mellitus ([place])     Plan: -Examined patient. -Continue diabetic foot care principles. -Toenails 1-5 b/l were debrided in length and girth with sterile nail nippers and dremel without iatrogenic bleeding.  -Resolved porokeratotic lesion(s) R 4th toe and submet head 4 left foot pared and enucleated with sterile scalpel blade without incident. -Patient to report any pedal injuries to medical professional immediately. -Patient to continue soft, supportive shoe gear daily. -Patient/POA to call should there be question/concern in the interim.  Return in about 3 months (around 05/07/2021).  Marzetta Board, DPM

## 2021-02-11 NOTE — Progress Notes (Signed)
Glad it helped!

## 2021-03-23 NOTE — Progress Notes (Deleted)
Cardiology Office Note:    Date:  03/23/2021   ID:  Jessica [name], DOB 1927/02/08, MRN [id number]  PCP:  Binnie Rail, MD   [facility] Providers Cardiologist:  Ena Dawley, MD (Inactive) {  Referring MD: Binnie Rail, MD     History of Present Illness:    Jessica [name] is a 87 y.o. female with a hx of CAD (DESx2 to mid and distal RCA in 2010), chronic combined CHF, hypertensive heart disease, DM with peripheral neuropathy, HTN, HLD, SSS s/p Medtronic pacemaker 2011, permanent atrial fib (discovered on device interrogation 10/2014), CKD stage III, moderate pulm HTN who was previously followed by Dr. Meda [name] who now presents to clinic for follow-up of her CAD and HF.  Per review of the record, she was hospitalized for acute diastolic CHF in 05/8294 in the setting of accelerated HTN and PAF. She underwent DCCV at that time. She was managed at the end of 2017 with worsening CHF in the setting of previous noncompliance with fluid restriction (up to 4L of water per day). Over the last few years she has progressed to permanent atrial fibrillation. She was admitted in 10/2017 for acute on chronic combined CHF with new cardiomyopathy with EF 30-35%, moderate diffuse hypokinesis, mild aortic insufficiency, mild mitral regurgitation, mildly increased PASP. The patient and daughter wished for conservative management and declined cath at that time.  She was last seen by Dr. Meda [name] on 09/03/20 where she was doing well from a CV standpoint. Saw Tommye Standard on 10/15/20 where again she was doing well other than pain in her feet. She was tolerating her medications without issues.  Today  Past Medical History:  Diagnosis Date  . Breast cancer ([place])   . CAD (coronary artery disease)    a. s/p DESx2 to mid and distal RCA in 2010.  Marland Kitchen Cholelithiasis 09/12/2013   Lap Chole on 09/14/13   . Chronic combined systolic and diastolic CHF (congestive heart failure) ([place])    a. Echo 06/09/15: EF 55-60% ->  10/2017 EF 30-35% (pt and daughter did not wish to proceed with ischemic assessment - conservative rx).  . CKD (chronic kidney disease), stage III ([place])   . Diabetes mellitus    NON INSULIN DEPENDENT  . DJD (degenerative joint disease)   . GERD (gastroesophageal reflux disease)   . Hemorrhoids   . History of diabetic neuropathy   . Hypercholesterolemia   . Hypertension   . Hypertensive heart disease   . Permanent atrial fibrillation (HCC)    a. discovered on PPM interrogation 12/15- > eventually progressed to 100% atrial fib burden/permanent atrial fib.  . SSS (sick sinus syndrome) ([place])    a. s/p Medtronic PPM by JA for SSS and syncope 9/11.    Past Surgical History:  Procedure Laterality Date  . CARDIAC CATHETERIZATION  07/05/2009   EF 55-60%  . CARDIOVERSION N/A 06/11/2015   Procedure: CARDIOVERSION;  Surgeon: [name], MD;  Location[facility] ENDOSCOPY;  Service: Cardiovascular;  Laterality: N/A;  . CHOLECYSTECTOMY N/A 09/14/2013   Procedure: LAPAROSCOPIC CHOLECYSTECTOMY WITH INTRAOPERATIVE CHOLANGIOGRAM;  Surgeon: [name], MD;  Location: [place];  Service: General;  Laterality: N/A;  . COLONOSCOPY    . ERCP N/A 09/13/2013   Procedure: ENDOSCOPIC RETROGRADE CHOLANGIOPANCREATOGRAPHY (ERCP);  Surgeon: [name], MD;  Location: [facility] ENDOSCOPY;  Service: Endoscopy;  Laterality: N/A;  . INSERT / REPLACE / REMOVE PACEMAKER  9/11   SSS and syncope, implanted by JA (MDT)  . MASTECTOMY  1992   BILATERAL WITH RECONSTRUCTION    Current Medications: No outpatient medications have been marked as taking for the 03/25/21 encounter (Appointment) with Freada Bergeron, MD.     Allergies:   Lyrica [pregabalin], Amlodipine, and Sulfonamide derivatives   Social History   Socioeconomic History  . Marital status: Widowed    Spouse name: Not on file  . Number of children: Not on file  . Years of education: Not on file  . Highest education level: Not on file  Occupational  History  . Not on file  Tobacco Use  . Smoking status: Never Smoker  . Smokeless tobacco: Never Used  Vaping Use  . Vaping Use: Never used  Substance and Sexual Activity  . Alcohol use: No    Alcohol/week: 0.0 standard drinks  . Drug use: No  . Sexual activity: Not on file  Other Topics Concern  . Not on file  Social History Narrative  . Not on file   Social Determinants of Health   Financial Resource Strain: Not on file  Food Insecurity: Not on file  Transportation Needs: Not on file  Physical Activity: Not on file  Stress: Not on file  Social Connections: Not on file     Family History: The patient's ***family history includes Cancer in her father and mother.  ROS:   Please see the history of present illness.    *** All other systems reviewed and are negative.  EKGs/Labs/Other Studies Reviewed:    The following studies were reviewed today: TTE 02-25-2017: Left ventricle: The cavity size was normal. Wall thickness was  increased in a pattern of moderate LVH. Systolic function was  moderately to severely reduced. The estimated ejection fraction was  in the range of 30% to 35%. Moderate diffuse hypokinesis.   -------------------------------------------------------------------  Aortic valve:  Trileaflet; normal thickness, mildly calcified  leaflets. Mobility was not restricted. Doppler: Transvalvular  velocity was within the normal range. There was no stenosis. There  was mild regurgitation.   -------------------------------------------------------------------  Aorta: Aortic root: The aortic root was normal in size.   -------------------------------------------------------------------  Mitral valve:  Mildly calcified annulus. Mobility was not  restricted. Doppler: Transvalvular velocity was within the normal  range. There was no evidence for stenosis. There was mild  regurgitation.  Peak gradient (D): 4 mm Hg.    -------------------------------------------------------------------  Left atrium: The atrium was normal in size.   -------------------------------------------------------------------  Right ventricle: The cavity size was normal. Wall thickness was  normal. Pacer wire or catheter noted in right ventricle. Systolic  function was normal.   -------------------------------------------------------------------  Pulmonic valve:  Doppler: Transvalvular velocity was within the  normal range. There was no evidence for stenosis.   -------------------------------------------------------------------  Tricuspid valve:  Structurally normal valve.  Doppler:  Transvalvular velocity was within the normal range. There was no  regurgitation.   -------------------------------------------------------------------  Pulmonary artery:  The main pulmonary artery was normal-sized.  Systolic pressure was mildly increased.   -------------------------------------------------------------------  Right atrium: The atrium was normal in size.   -------------------------------------------------------------------  Pericardium: There was no pericardial effusion.   -------------------------------------------------------------------  Systemic veins:  Inferior vena cava: The vessel was dilated. The respirophasic  diameter changes were blunted (< 50%).   EKG:  EKG is *** ordered today.  The ekg ordered today demonstrates ***  Recent Labs: 09/03/2020: ALT 13; BUN 23; Creatinine, Ser 1.13; Hemoglobin 13.2; NT-Pro BNP 4,231; Platelets 223; Potassium 4.4; Sodium 146; TSH 4.270  Recent Lipid Panel    Component Value  Date/Time   CHOL 129 10/19/2019 1540   TRIG 75.0 10/19/2019 1540   HDL 41.60 10/19/2019 1540   CHOLHDL 3 10/19/2019 1540   VLDL 15.0 10/19/2019 1540   LDLCALC 73 10/19/2019 1540   LDLDIRECT 152.0 10/10/2013 1619     Risk Assessment/Calculations:   {Does this patient have ATRIAL  FIBRILLATION?[phone]}   Physical Exam:    VS:  LMP  (LMP Unknown)     Wt Readings from Last 3 Encounters:  10/15/20 160 lb 12.8 oz (72.9 kg)  10/09/20 160 lb 6.4 oz (72.8 kg)  09/03/20 157 lb 12.8 oz (71.6 kg)     GEN: *** Well nourished, well developed in no acute distress HEENT: Normal NECK: No JVD; No carotid bruits LYMPHATICS: No lymphadenopathy CARDIAC: ***RRR, no murmurs, rubs, gallops RESPIRATORY:  Clear to auscultation without rales, wheezing or rhonchi  ABDOMEN: Soft, non-tender, non-distended MUSCULOSKELETAL:  No edema; No deformity  SKIN: Warm and dry NEUROLOGIC:  Alert and oriented x 3 PSYCHIATRIC:  Normal affect   ASSESSMENT:    No diagnosis found. PLAN:    In order of problems listed above:  #Chronic Combined Systolic and Diastolic Heart Failure: EF 30-35% with diffuse hypokinesis. Clinically compensated on examination. -Continue coreg 25mg  BID -Continue lisinopril 7.5mg  daily -Low Na diet  #CAD s/p PCI to mid and distal RCA in 2010: No recent angina. Declined cath after recently declined EF. Prefers conservative management. -No ASA given need for AC -Off prava due to memory issues -Continue to monitor  #Permanent Afib: Followed by Dr. [name]. -Continue apixban 5mg  BID -Continue coreg 25mg  BID  #SSS s/p PPM placement: -Follow-up with Dr. [name] as scheduled  #HTN: -Continue coreg 25mg  BID -Continue lisinopril 7.5mg  daily  #HLD: -Off prava due to memory issues  #DMII: -Management per PCP   {Are you ordering a CV Procedure (e.g. stress test, cath, DCCV, TEE, etc)?   Press F2        :[id number]}    Medication Adjustments/Labs and Tests Ordered: Current medicines are reviewed at length with the patient today.  Concerns regarding medicines are outlined above.  No orders of the defined types were placed in this encounter.  No orders of the defined types were placed in this encounter.   There are no Patient Instructions on file for  this visit.   Signed, Freada Bergeron, MD  03/23/2021 3:15 PM    [facility]

## 2021-03-25 ENCOUNTER — Other Ambulatory Visit: Payer: Self-pay

## 2021-03-25 ENCOUNTER — Ambulatory Visit: Payer: Medicare Other | Admitting: Cardiology

## 2021-03-25 ENCOUNTER — Encounter: Payer: Self-pay | Admitting: Cardiology

## 2021-03-25 VITALS — BP 130/80 | HR 79 | Ht 63.0 in | Wt 153.8 lb

## 2021-03-25 DIAGNOSIS — I2510 Atherosclerotic heart disease of native coronary artery without angina pectoris: Secondary | ICD-10-CM

## 2021-03-25 DIAGNOSIS — I878 Other specified disorders of veins: Secondary | ICD-10-CM

## 2021-03-25 DIAGNOSIS — I4811 Longstanding persistent atrial fibrillation: Secondary | ICD-10-CM | POA: Diagnosis not present

## 2021-03-25 DIAGNOSIS — Z950 Presence of cardiac pacemaker: Secondary | ICD-10-CM

## 2021-03-25 DIAGNOSIS — R600 Localized edema: Secondary | ICD-10-CM

## 2021-03-25 DIAGNOSIS — I495 Sick sinus syndrome: Secondary | ICD-10-CM

## 2021-03-25 DIAGNOSIS — I5022 Chronic systolic (congestive) heart failure: Secondary | ICD-10-CM

## 2021-03-25 DIAGNOSIS — I10 Essential (primary) hypertension: Secondary | ICD-10-CM

## 2021-03-25 NOTE — Patient Instructions (Signed)
Medication Instructions:  Your physician recommends that you continue on your current medications as directed. Please refer to the Current Medication list given to you today. *If you need a refill on your cardiac medications before your next appointment, please call your pharmacy*   Lab Work: None ordered If you have labs (blood work) drawn today and your tests are completely normal, you will receive your results only by: Marland Kitchen MyChart Message (if you have MyChart) OR . A paper copy in the mail If you have any lab test that is abnormal or we need to change your treatment, we will call you to review the results.     Follow-Up: At [facility], you and your health needs are our priority.  As part of our continuing mission to provide you with exceptional heart care, we have created designated Provider Care Teams.  These Care Teams include your primary Cardiologist (physician) and Advanced Practice Providers (APPs -  Physician Assistants and Nurse Practitioners) who all work together to provide you with the care you need, when you need it.  We recommend signing up for the patient portal called "MyChart".  Sign up information is provided on this After Visit Summary.  MyChart is used to connect with patients for Virtual Visits (Telemedicine).  Patients are able to view lab/test results, encounter notes, upcoming appointments, etc.  Non-urgent messages can be sent to your provider as well.   To learn more about what you can do with MyChart, go to [email or web address].     Your next appointment:   6-8 months The format for your next appointment:   In Person  Provider:   You will see one of the following Advanced Practice Providers on your designated Care Team:    Richardson Dopp, PA-C  Vin Ward, PA-C  [name], [place]

## 2021-03-25 NOTE — Progress Notes (Signed)
Cardiology Office Note:    Date:  03/25/2021   ID:  Jessica [name], DOB 22-Jan-1927, MRN [id number]  PCP:  Binnie Rail, MD   [facility] Providers Cardiologist:  Ena Dawley, MD (Inactive) {  Referring MD: Binnie Rail, MD     History of Present Illness:    Jessica [name] is a 87 y.o. female with a hx of CAD (DESx2 to mid and distal RCA in 2010), chronic combined CHF, hypertensive heart disease, DM with peripheral neuropathy, HTN, HLD, SSS s/p Medtronic pacemaker 2011, permanent atrial fib (discovered on device interrogation 10/2014), CKD stage III, moderate pulm HTN who was previously followed by Dr. [name] who now presents to clinic for follow-up of her CAD and HF.  Per review of the record, she was hospitalized for acute diastolic CHF in 07/9241 in the setting of accelerated HTN and PAF. She underwent DCCV at that time. She was managed at the end of 2017 with worsening CHF in the setting of previous noncompliance with fluid restriction (up to 4L of water per day). Over the last few years she has progressed to permanent atrial fibrillation. She was admitted in 10/2017 for acute on chronic combined CHF with new cardiomyopathy with EF 30-35%, moderate diffuse hypokinesis, mild aortic insufficiency, mild mitral regurgitation, mildly increased PASP. The patient and daughter wished for conservative management and declined cath at that time.  She was last seen by Dr. [name] on 09/03/20 where she was doing well from a CV standpoint. Saw Tommye Standard on 10/15/20 where again she was doing well other than pain in her feet. She was tolerating her medications without issues.  Today, she is accompanied by her daughter, who also provides some history. She reports feeling generally weak and tired throughout the day. She also reports occasional trace LE swelling, but this is relieved by propping her feet up at night. Notably, has chronic venous stasis changes. She is otherwise stable from a CV  standpoint with no chest pain, SOB, orthopnea, PND, lightheadedness or dizziness.  Past Medical History:  Diagnosis Date  . Breast cancer ([place])   . CAD (coronary artery disease)    a. s/p DESx2 to mid and distal RCA in 2010.  Marland Kitchen Cholelithiasis 09/12/2013   Lap Chole on 09/14/13   . Chronic combined systolic and diastolic CHF (congestive heart failure) ([place])    a. Echo 06/09/15: EF 55-60% -> 10/2017 EF 30-35% (pt and daughter did not wish to proceed with ischemic assessment - conservative rx).  . CKD (chronic kidney disease), stage III ([place])   . Diabetes mellitus    NON INSULIN DEPENDENT  . DJD (degenerative joint disease)   . GERD (gastroesophageal reflux disease)   . Hemorrhoids   . History of diabetic neuropathy   . Hypercholesterolemia   . Hypertension   . Hypertensive heart disease   . Permanent atrial fibrillation (HCC)    a. discovered on PPM interrogation 12/15- > eventually progressed to 100% atrial fib burden/permanent atrial fib.  . SSS (sick sinus syndrome) ([place])    a. s/p Medtronic PPM by JA for SSS and syncope 9/11.    Past Surgical History:  Procedure Laterality Date  . CARDIAC CATHETERIZATION  07/05/2009   EF 55-60%  . CARDIOVERSION N/A 06/11/2015   Procedure: CARDIOVERSION;  Surgeon: [name], MD;  Location: [facility] ENDOSCOPY;  Service: Cardiovascular;  Laterality: N/A;  . CHOLECYSTECTOMY N/A 09/14/2013   Procedure: LAPAROSCOPIC CHOLECYSTECTOMY WITH INTRAOPERATIVE CHOLANGIOGRAM;  Surgeon: [name], MD;  Location:  [facility] OR;  Service: General;  Laterality: N/A;  . COLONOSCOPY    . ERCP N/A 09/13/2013   Procedure: ENDOSCOPIC RETROGRADE CHOLANGIOPANCREATOGRAPHY (ERCP);  Surgeon: [name], MD;  Location: [facility] ENDOSCOPY;  Service: Endoscopy;  Laterality: N/A;  . INSERT / REPLACE / REMOVE PACEMAKER  9/11   SSS and syncope, implanted by JA (MDT)  . MASTECTOMY  1992   BILATERAL WITH RECONSTRUCTION    Current Medications: Current Meds  Medication Sig  . b  complex vitamins tablet Take 1 tablet by mouth daily.  . carvedilol (COREG) 25 MG tablet Take 1 tablet by mouth twice daily  . docusate sodium (COLACE) 100 MG capsule Take 100 mg by mouth at bedtime.  Marland Kitchen ECHINACEA PO Take 750 mg by mouth daily.  Marland Kitchen ELIQUIS 5 MG TABS tablet Take 1 tablet by mouth twice daily  . fexofenadine (ALLEGRA) 180 MG tablet Take 180 mg by mouth daily.  . fish oil-omega-3 fatty acids 1000 MG capsule Take 1 g by mouth every morning.  . furosemide (LASIX) 20 MG tablet TAKE 2 TABLETS BY MOUTH IN THE MORNING, AND  1 TABLET IN THE AFTERNOON  . gabapentin (NEURONTIN) 100 MG capsule TAKE 1 CAPSULE BY MOUTH NIGHTLY TAKE  IN  ADDITION  TO  300  MG  CAPSULE  AT  NIGHT  . gabapentin (NEURONTIN) 300 MG capsule TAKE 1 CAPSULE BY MOUTH THREE TIMES DAILY  . lisinopril (ZESTRIL) 5 MG tablet TAKE 1 & 1/2 (ONE & ONE-HALF) TABLETS BY MOUTH ONCE DAILY  . Misc. Devices (ROLLATOR ULTRA-LIGHT) MISC With seat.  Diabetic neuropathy, knee arthritis  . nitroGLYCERIN (NITROSTAT) 0.4 MG SL tablet Place 1 tablet (0.4 mg total) under the tongue every 5 (five) minutes as needed for chest pain. x3 doses as needed for chest pain  . potassium chloride SA (KLOR-CON) 20 MEQ tablet Take 1 tablet by mouth once daily     Allergies:   Lyrica [pregabalin], Amlodipine, and Sulfonamide derivatives   Social History   Socioeconomic History  . Marital status: Widowed    Spouse name: Not on file  . Number of children: Not on file  . Years of education: Not on file  . Highest education level: Not on file  Occupational History  . Not on file  Tobacco Use  . Smoking status: Never Smoker  . Smokeless tobacco: Never Used  Vaping Use  . Vaping Use: Never used  Substance and Sexual Activity  . Alcohol use: No    Alcohol/week: 0.0 standard drinks  . Drug use: No  . Sexual activity: Not on file  Other Topics Concern  . Not on file  Social History Narrative  . Not on file   Social Determinants of Health    Financial Resource Strain: Not on file  Food Insecurity: Not on file  Transportation Needs: Not on file  Physical Activity: Not on file  Stress: Not on file  Social Connections: Not on file     Family History: The patient's family history includes Cancer in her father and mother.  ROS:   Please see the history of present illness.    Review of Systems  Constitutional: Positive for malaise/fatigue. Negative for diaphoresis and weight loss.  HENT: Negative for ear pain and sore throat.   Eyes: Negative for blurred vision, pain and discharge.  Respiratory: Positive for shortness of breath. Negative for cough, wheezing and stridor.   Cardiovascular: Positive for leg swelling. Negative for chest pain, palpitations, orthopnea, claudication and PND.  Gastrointestinal:  Negative for abdominal pain, blood in stool, diarrhea and nausea.  Genitourinary: Negative for hematuria and urgency.  Musculoskeletal: Positive for joint pain. Negative for back pain, falls and neck pain.  Neurological: Positive for weakness. Negative for dizziness, tingling, loss of consciousness and headaches.  Endo/Heme/Allergies: Negative for environmental allergies. Does not bruise/bleed easily.  Psychiatric/Behavioral: Negative for depression, hallucinations, memory loss and suicidal ideas.     EKGs/Labs/Other Studies Reviewed:    The following studies were reviewed today: TTE 15-Feb-2017: Left ventricle: The cavity size was normal. Wall thickness was  increased in a pattern of moderate LVH. Systolic function was  moderately to severely reduced. The estimated ejection fraction was  in the range of 30% to 35%. Moderate diffuse hypokinesis.   -------------------------------------------------------------------  Aortic valve:  Trileaflet; normal thickness, mildly calcified  leaflets. Mobility was not restricted. Doppler: Transvalvular  velocity was within the normal range. There was no stenosis. There  was mild  regurgitation.   -------------------------------------------------------------------  Aorta: Aortic root: The aortic root was normal in size.   -------------------------------------------------------------------  Mitral valve:  Mildly calcified annulus. Mobility was not  restricted. Doppler: Transvalvular velocity was within the normal  range. There was no evidence for stenosis. There was mild  regurgitation.  Peak gradient (D): 4 mm Hg.   -------------------------------------------------------------------  Left atrium: The atrium was normal in size.   -------------------------------------------------------------------  Right ventricle: The cavity size was normal. Wall thickness was  normal. Pacer wire or catheter noted in right ventricle. Systolic  function was normal.   -------------------------------------------------------------------  Pulmonic valve:  Doppler: Transvalvular velocity was within the  normal range. There was no evidence for stenosis.   -------------------------------------------------------------------  Tricuspid valve:  Structurally normal valve.  Doppler:  Transvalvular velocity was within the normal range. There was no  regurgitation.   -------------------------------------------------------------------  Pulmonary artery:  The main pulmonary artery was normal-sized.  Systolic pressure was mildly increased.   -------------------------------------------------------------------  Right atrium: The atrium was normal in size.   -------------------------------------------------------------------  Pericardium: There was no pericardial effusion.   -------------------------------------------------------------------  Systemic veins:  Inferior vena cava: The vessel was dilated. The respirophasic  diameter changes were blunted (< 50%).   EKG:   03/25/2021: EKG is not ordered today.  Recent Labs: 09/03/2020: ALT 13; BUN 23; Creatinine, Ser 1.13;  Hemoglobin 13.2; NT-Pro BNP 4,231; Platelets 223; Potassium 4.4; Sodium 146; TSH 4.270  Recent Lipid Panel    Component Value Date/Time   CHOL 129 10/19/2019 1540   TRIG 75.0 10/19/2019 1540   HDL 41.60 10/19/2019 1540   CHOLHDL 3 10/19/2019 1540   VLDL 15.0 10/19/2019 1540   LDLCALC 73 10/19/2019 1540   LDLDIRECT 152.0 10/10/2013 1619     Risk Assessment/Calculations:    CHA2DS2-VASc Score = 7  {This indicates a 11.2% annual risk of stroke. The patient's score is based upon: CHF History: Yes HTN History: Yes Diabetes History: Yes Stroke History: No Vascular Disease History: Yes Age Score: 2 Gender Score: 1    Physical Exam:    VS:  BP 130/80   Pulse 79   Ht 5\' 3"  (1.6 m)   Wt 153 lb 12.8 oz (69.8 kg)   LMP  (LMP Unknown)   SpO2 97%   BMI 27.24 kg/m     Wt Readings from Last 3 Encounters:  03/25/21 153 lb 12.8 oz (69.8 kg)  10/15/20 160 lb 12.8 oz (72.9 kg)  10/09/20 160 lb 6.4 oz (72.8 kg)     GEN: Elderly female, NAD HEENT: Normal  NECK: No carotid bruit CV: Irregularly irregular, no murmus RESPIRATORY:  Clear to auscultation without rales, wheezing or rhonchi  ABDOMEN: Soft, non-tender, non-distended MUSCULOSKELETAL: Trace bilateral LE edema; chronic venous stasis changes with spider veins  SKIN: Warm and dry NEUROLOGIC:  Alert and oriented x 3 PSYCHIATRIC:  Normal affect   ASSESSMENT:    1. Chronic systolic congestive heart failure ([place])   2. SSS (sick sinus syndrome) (HCC)   3. Cardiac pacemaker in situ   4. Longstanding persistent atrial fibrillation ([place])   5. Essential hypertension   6. CAD in native artery   7. Bilateral leg edema   8. Venous stasis    PLAN:    In order of problems listed above:  #Chronic Combined Systolic and Diastolic Heart Failure: EF 30-35% with diffuse hypokinesis. Clinically compensated on examination with NYHA class II symptoms. Euvolemic today. -Continue lasix 40mg  in the AM/20mg  in PM -Continue coreg 25mg   BID -Continue lisinopril 7.5mg  daily -Low Na diet  #CAD s/p PCI to mid and distal RCA in 2010: No recent angina. Declined cath after recently declined EF. Prefers conservative management. -No ASA given need for AC -Off prava due to memory issues -Continue to monitor  #Permanent Afib: Followed by Dr. [name]. -Continue apixban 5mg  BID -Continue coreg 25mg  BID  #SSS s/p PPM placement: -Follow-up with Dr. [name] as scheduled  #HTN: -Continue coreg 25mg  BID -Continue lisinopril 7.5mg  daily  #HLD: -Off prava due to memory issues  #DMII: -Management per PCP  #LE edema: #Venous Stasis: -Compression socks -Leg elevation -Continue lasix as above  Follow-up in 6-8 months.  Medication Adjustments/Labs and Tests Ordered: Current medicines are reviewed at length with the patient today.  Concerns regarding medicines are outlined above.  No orders of the defined types were placed in this encounter.  No orders of the defined types were placed in this encounter.   Patient Instructions  Medication Instructions:  Your physician recommends that you continue on your current medications as directed. Please refer to the Current Medication list given to you today. *If you need a refill on your cardiac medications before your next appointment, please call your pharmacy*   Lab Work: None ordered If you have labs (blood work) drawn today and your tests are completely normal, you will receive your results only by: Marland Kitchen MyChart Message (if you have MyChart) OR . A paper copy in the mail If you have any lab test that is abnormal or we need to change your treatment, we will call you to review the results.     Follow-Up: At [facility], you and your health needs are our priority.  As part of our continuing mission to provide you with exceptional heart care, we have created designated Provider Care Teams.  These Care Teams include your primary Cardiologist (physician) and Advanced Practice  Providers (APPs -  Physician Assistants and Nurse Practitioners) who all work together to provide you with the care you need, when you need it.  We recommend signing up for the patient portal called "MyChart".  Sign up information is provided on this After Visit Summary.  MyChart is used to connect with patients for Virtual Visits (Telemedicine).  Patients are able to view lab/test results, encounter notes, upcoming appointments, etc.  Non-urgent messages can be sent to your provider as well.   To learn more about what you can do with MyChart, go to [email or web address].     Your next appointment:   6-8 months The format for your next appointment:  In Person  Provider:   You will see one of the following Advanced Practice Providers on your designated Care Team:    Richardson Dopp, PA-C  [place], PA-C  Dayna Dunn, PA-C       I,Mathew Stumpf,acting as a scribe for Freada Bergeron, MD.,have documented all relevant documentation on the behalf of Freada Bergeron, MD,as directed by  Freada Bergeron, MD while in the presence of Freada Bergeron, MD.  I, Freada Bergeron, MD, have reviewed all documentation for this visit. The documentation on 03/25/21 for the exam, diagnosis, procedures, and orders are all accurate and complete.  Signed, Freada Bergeron, MD  03/25/2021 5:34 PM    [facility]

## 2021-04-09 LAB — HM DIABETES EYE EXAM

## 2021-04-10 ENCOUNTER — Ambulatory Visit (INDEPENDENT_AMBULATORY_CARE_PROVIDER_SITE_OTHER): Payer: Medicare Other

## 2021-04-10 DIAGNOSIS — I495 Sick sinus syndrome: Secondary | ICD-10-CM

## 2021-04-10 LAB — CUP PACEART REMOTE DEVICE CHECK
Battery Impedance: 1935 Ohm
Battery Remaining Longevity: 37 mo
Battery Voltage: 2.76 V
Brady Statistic AP VP Percent: 0 %
Brady Statistic AP VS Percent: 0 %
Brady Statistic AS VP Percent: 40 %
Brady Statistic AS VS Percent: 60 %
Date Time Interrogation Session: 20220608124950
Implantable Lead Implant Date: 20110912
Implantable Lead Implant Date: 20110912
Implantable Lead Location: 753859
Implantable Lead Location: 753860
Implantable Lead Model: 5076
Implantable Lead Model: 5092
Implantable Pulse Generator Implant Date: 20110912
Lead Channel Impedance Value: 491 Ohm
Lead Channel Impedance Value: 673 Ohm
Lead Channel Pacing Threshold Amplitude: 0.5 V
Lead Channel Pacing Threshold Amplitude: 0.75 V
Lead Channel Pacing Threshold Pulse Width: 0.4 ms
Lead Channel Pacing Threshold Pulse Width: 0.4 ms
Lead Channel Setting Pacing Amplitude: 2 V
Lead Channel Setting Pacing Amplitude: 2.5 V
Lead Channel Setting Pacing Pulse Width: 0.4 ms
Lead Channel Setting Sensing Sensitivity: 2 mV

## 2021-04-19 ENCOUNTER — Encounter: Payer: Self-pay | Admitting: Internal Medicine

## 2021-04-19 NOTE — Progress Notes (Unsigned)
Outside notes received. Information abstracted. Notes sent to scan.  

## 2021-04-29 ENCOUNTER — Other Ambulatory Visit: Payer: Self-pay | Admitting: Internal Medicine

## 2021-05-03 NOTE — Progress Notes (Signed)
Remote pacemaker transmission.   

## 2021-05-12 ENCOUNTER — Other Ambulatory Visit: Payer: Self-pay | Admitting: Internal Medicine

## 2021-05-12 DIAGNOSIS — I5033 Acute on chronic diastolic (congestive) heart failure: Secondary | ICD-10-CM

## 2021-05-12 DIAGNOSIS — I5042 Chronic combined systolic (congestive) and diastolic (congestive) heart failure: Secondary | ICD-10-CM

## 2021-05-13 NOTE — Telephone Encounter (Signed)
Prescription refill request for Eliquis received. Indication: afib  Last office visit: Johney Frame 03/25/2021 Scr: 1.13, 09/04/2020  Age: 87 yo  Weight: 69.8 kg  Pt is on the correct dose of Eliquis per dosing criteria, prescription refill sent for Eliquis 5mg  BID.

## 2021-05-22 ENCOUNTER — Ambulatory Visit: Payer: Medicare Other | Admitting: Podiatry

## 2021-05-22 ENCOUNTER — Other Ambulatory Visit: Payer: Self-pay

## 2021-05-22 ENCOUNTER — Encounter: Payer: Self-pay | Admitting: Podiatry

## 2021-05-22 DIAGNOSIS — M79676 Pain in unspecified toe(s): Secondary | ICD-10-CM | POA: Diagnosis not present

## 2021-05-22 DIAGNOSIS — E1142 Type 2 diabetes mellitus with diabetic polyneuropathy: Secondary | ICD-10-CM

## 2021-05-22 DIAGNOSIS — B351 Tinea unguium: Secondary | ICD-10-CM | POA: Diagnosis not present

## 2021-05-25 NOTE — Progress Notes (Signed)
  Subjective:  Patient ID: Jessica [name], female    DOB: 15-Dec-1926,  MRN: [id number]  87 y.o. female presents with at risk foot care with history of diabetic neuropathy and painful thick toenails that are difficult to trim. Pain interferes with ambulation. Aggravating factors include wearing enclosed shoe gear. Pain is relieved with periodic professional debridement..    Patient's blood sugar was "100-something" mg/dl today.  PCP: Binnie Rail, MD and last visit was: 10/09/2020.  Review of Systems: Negative except as noted in the HPI.   Allergies  Allergen Reactions   Lyrica [Pregabalin] Swelling    Caused weight gain   Amlodipine Swelling   Sulfonamide Derivatives Rash    Objective:  There were no vitals filed for this visit. Constitutional Patient is a pleasant 87 y.o. Caucasian female in NAD. AAO x 3.  Vascular Capillary fill time to digits <3 seconds b/l lower extremities. Palpable pedal pulses b/l LE. Pedal hair absent. Lower extremity skin temperature gradient within normal limits. No pain with calf compression b/l. No edema noted b/l lower extremities. No cyanosis or clubbing noted.  Neurologic Normal speech. Protective sensation decreased with 10 gram monofilament b/l. Vibratory sensation decreased b/l lower extremities.  Dermatologic Pedal skin with normal turgor, texture and tone b/l lower extremities. No open wounds b/l lower extremities. No interdigital macerations b/l lower extremities. Toenails 1-5 b/l elongated, discolored, dystrophic, thickened, crumbly with subungual debris and tenderness to dorsal palpation.  Orthopedic: Normal muscle strength 5/5 to all lower extremity muscle groups bilaterally. No pain crepitus or joint limitation noted with ROM b/l. Hammertoe(s) noted to the b/l lower extremities. Utilizes rollator for ambulation assistance.   Hemoglobin A1C Latest Ref Rng & Units 10/09/2020 10/09/2020 10/09/2020 10/09/2020  HGBA1C 4.0 - 5.6 % 6.2 6.2 6.2 6.2(A)  Some  recent data might be hidden   Assessment:   1. Pain due to onychomycosis of toenail   2. Diabetic peripheral neuropathy associated with type 2 diabetes mellitus ([place])    Plan:  Patient was evaluated and treated and all questions answered.  Onychomycosis with pain -Nails palliatively debridement as below. -Educated on self-care  Procedure: Nail Debridement Rationale: Pain Type of Debridement: manual, sharp debridement. Instrumentation: Nail nipper, rotary burr. Number of Nails: 10  -No new findings. No new orders. -Continue diabetic foot care principles. -Patient to continue soft, supportive shoe gear daily. -Toenails 1-5 b/l were debrided in length and girth with sterile nail nippers and dremel without iatrogenic bleeding.  -Patient to report any pedal injuries to medical professional immediately. -Patient/POA to call should there be question/concern in the interim.  Return in about 3 months (around 08/22/2021).  Marzetta Board, DPM

## 2021-06-09 ENCOUNTER — Other Ambulatory Visit: Payer: Self-pay

## 2021-06-09 ENCOUNTER — Inpatient Hospital Stay (HOSPITAL_COMMUNITY)
Admission: EM | Admit: 2021-06-09 | Discharge: 2021-06-14 | DRG: 177 | Disposition: A | Discharge status: Skilled Nursing Facility | Payer: Medicare Other | Attending: Internal Medicine | Admitting: Internal Medicine

## 2021-06-09 ENCOUNTER — Emergency Department (HOSPITAL_COMMUNITY): Payer: Medicare Other

## 2021-06-09 ENCOUNTER — Encounter (HOSPITAL_COMMUNITY): Payer: Self-pay | Admitting: Emergency Medicine

## 2021-06-09 DIAGNOSIS — E1142 Type 2 diabetes mellitus with diabetic polyneuropathy: Secondary | ICD-10-CM | POA: Diagnosis present

## 2021-06-09 DIAGNOSIS — E119 Type 2 diabetes mellitus without complications: Secondary | ICD-10-CM

## 2021-06-09 DIAGNOSIS — E1140 Type 2 diabetes mellitus with diabetic neuropathy, unspecified: Secondary | ICD-10-CM

## 2021-06-09 DIAGNOSIS — N390 Urinary tract infection, site not specified: Secondary | ICD-10-CM | POA: Diagnosis present

## 2021-06-09 DIAGNOSIS — N1832 Chronic kidney disease, stage 3b: Secondary | ICD-10-CM | POA: Diagnosis present

## 2021-06-09 DIAGNOSIS — U071 COVID-19: Secondary | ICD-10-CM | POA: Diagnosis not present

## 2021-06-09 DIAGNOSIS — Z853 Personal history of malignant neoplasm of breast: Secondary | ICD-10-CM

## 2021-06-09 DIAGNOSIS — J1282 Pneumonia due to coronavirus disease 2019: Secondary | ICD-10-CM | POA: Diagnosis present

## 2021-06-09 DIAGNOSIS — B961 Klebsiella pneumoniae [K. pneumoniae] as the cause of diseases classified elsewhere: Secondary | ICD-10-CM | POA: Diagnosis present

## 2021-06-09 DIAGNOSIS — Z955 Presence of coronary angioplasty implant and graft: Secondary | ICD-10-CM

## 2021-06-09 DIAGNOSIS — Z7901 Long term (current) use of anticoagulants: Secondary | ICD-10-CM

## 2021-06-09 DIAGNOSIS — I495 Sick sinus syndrome: Secondary | ICD-10-CM | POA: Diagnosis present

## 2021-06-09 DIAGNOSIS — Z950 Presence of cardiac pacemaker: Secondary | ICD-10-CM

## 2021-06-09 DIAGNOSIS — Z9013 Acquired absence of bilateral breasts and nipples: Secondary | ICD-10-CM

## 2021-06-09 DIAGNOSIS — G9341 Metabolic encephalopathy: Secondary | ICD-10-CM | POA: Diagnosis present

## 2021-06-09 DIAGNOSIS — J9601 Acute respiratory failure with hypoxia: Secondary | ICD-10-CM | POA: Diagnosis present

## 2021-06-09 DIAGNOSIS — N179 Acute kidney failure, unspecified: Secondary | ICD-10-CM

## 2021-06-09 DIAGNOSIS — R0602 Shortness of breath: Secondary | ICD-10-CM

## 2021-06-09 DIAGNOSIS — I2510 Atherosclerotic heart disease of native coronary artery without angina pectoris: Secondary | ICD-10-CM | POA: Diagnosis present

## 2021-06-09 DIAGNOSIS — Z79899 Other long term (current) drug therapy: Secondary | ICD-10-CM

## 2021-06-09 DIAGNOSIS — I10 Essential (primary) hypertension: Secondary | ICD-10-CM | POA: Diagnosis present

## 2021-06-09 DIAGNOSIS — I160 Hypertensive urgency: Secondary | ICD-10-CM | POA: Diagnosis present

## 2021-06-09 DIAGNOSIS — K219 Gastro-esophageal reflux disease without esophagitis: Secondary | ICD-10-CM | POA: Diagnosis present

## 2021-06-09 DIAGNOSIS — L89152 Pressure ulcer of sacral region, stage 2: Secondary | ICD-10-CM | POA: Diagnosis present

## 2021-06-09 DIAGNOSIS — I4821 Permanent atrial fibrillation: Secondary | ICD-10-CM | POA: Diagnosis present

## 2021-06-09 DIAGNOSIS — E1122 Type 2 diabetes mellitus with diabetic chronic kidney disease: Secondary | ICD-10-CM | POA: Diagnosis present

## 2021-06-09 DIAGNOSIS — N189 Chronic kidney disease, unspecified: Secondary | ICD-10-CM

## 2021-06-09 DIAGNOSIS — Z28310 Unvaccinated for covid-19: Secondary | ICD-10-CM

## 2021-06-09 DIAGNOSIS — I5042 Chronic combined systolic (congestive) and diastolic (congestive) heart failure: Secondary | ICD-10-CM | POA: Diagnosis present

## 2021-06-09 DIAGNOSIS — E7800 Pure hypercholesterolemia, unspecified: Secondary | ICD-10-CM | POA: Diagnosis present

## 2021-06-09 DIAGNOSIS — I130 Hypertensive heart and chronic kidney disease with heart failure and stage 1 through stage 4 chronic kidney disease, or unspecified chronic kidney disease: Secondary | ICD-10-CM | POA: Diagnosis present

## 2021-06-09 DIAGNOSIS — E860 Dehydration: Secondary | ICD-10-CM | POA: Diagnosis present

## 2021-06-09 LAB — CBC WITH DIFFERENTIAL/PLATELET
Abs Immature Granulocytes: 0.02 10*3/uL (ref 0.00–0.07)
Basophils Absolute: 0 10*3/uL (ref 0.0–0.1)
Basophils Relative: 0 %
Eosinophils Absolute: 0 10*3/uL (ref 0.0–0.5)
Eosinophils Relative: 0 %
HCT: 42.1 % (ref 36.0–46.0)
Hemoglobin: 13.5 g/dL (ref 12.0–15.0)
Immature Granulocytes: 0 %
Lymphocytes Relative: 25 %
Lymphs Abs: 1.4 10*3/uL (ref 0.7–4.0)
MCH: 30 pg (ref 26.0–34.0)
MCHC: 32.1 g/dL (ref 30.0–36.0)
MCV: 93.6 fL (ref 80.0–100.0)
Monocytes Absolute: 0.7 10*3/uL (ref 0.1–1.0)
Monocytes Relative: 12 %
Neutro Abs: 3.5 10*3/uL (ref 1.7–7.7)
Neutrophils Relative %: 63 %
Platelets: 192 10*3/uL (ref 150–400)
RBC: 4.5 MIL/uL (ref 3.87–5.11)
RDW: 14.8 % (ref 11.5–15.5)
WBC: 5.6 10*3/uL (ref 4.0–10.5)
nRBC: 0 % (ref 0.0–0.2)

## 2021-06-09 LAB — URINALYSIS, ROUTINE W REFLEX MICROSCOPIC
Bilirubin Urine: NEGATIVE
Glucose, UA: NEGATIVE mg/dL
Hgb urine dipstick: NEGATIVE
Ketones, ur: NEGATIVE mg/dL
Nitrite: POSITIVE — AB
Protein, ur: 30 mg/dL — AB
Specific Gravity, Urine: 1.013 (ref 1.005–1.030)
pH: 5 (ref 5.0–8.0)

## 2021-06-09 LAB — BASIC METABOLIC PANEL
Anion gap: 6 (ref 5–15)
BUN: 37 mg/dL — ABNORMAL HIGH (ref 8–23)
CO2: 26 mmol/L (ref 22–32)
Calcium: 9.8 mg/dL (ref 8.9–10.3)
Chloride: 107 mmol/L (ref 98–111)
Creatinine, Ser: 1.61 mg/dL — ABNORMAL HIGH (ref 0.44–1.00)
GFR, Estimated: 30 mL/min — ABNORMAL LOW (ref >60)
Glucose, Bld: 163 mg/dL — ABNORMAL HIGH (ref 70–99)
Potassium: 4.5 mmol/L (ref 3.5–5.1)
Sodium: 139 mmol/L (ref 135–145)

## 2021-06-09 MED ORDER — SODIUM CHLORIDE 0.9 % IV SOLN
1.0000 g | INTRAVENOUS | Status: DC
  Administered 2021-06-10 – 2021-06-13 (×4): 1 g via INTRAVENOUS
  Filled 2021-06-09 (×4): qty 10

## 2021-06-09 NOTE — ED Triage Notes (Addendum)
Pt to triage via [facility] from home.  Tested +COVID yesterday.  C/o generalized malaise.  Temp 100 this morning.  Took Tylenol.  Sats 90% on room air on EMS arrival.  Placed on 2 liters and Sats up to 96%.  Denies chest pain and SOB.

## 2021-06-09 NOTE — ED Provider Notes (Signed)
Emergency Medicine Provider Triage Evaluation Note  Jessica [name] , a 87 y.o. female  was evaluated in triage.  Pt complains of fatigue. She tested positive for COVID yesterday. She is unvaccinated. Denies chest pain or shortness of breath; however, patient's oxygen found to be at 90% with EMS and she was placed on 2L [place]. She notes she reported to the ED because she lives with her daughter who also has [name] who is unable to take care of her.   Review of Systems  Positive: fatigue Negative: CP  Physical Exam  BP 124/61 (BP Location: Left Arm)   Pulse 66   Temp 98.8 F (37.1 C) (Oral)   Resp (!) 25   LMP  (LMP Unknown)   SpO2 94%  Gen:   Awake, no distress   Resp:  Normal effort  MSK:   Moves extremities without difficulty  Other:    Medical Decision Making  Medically screening exam initiated at 4:43 PM.  Appropriate orders placed.  ANAILA COLBY was informed that the remainder of the evaluation will be completed by another provider, this initial triage assessment does not replace that evaluation, and the importance of remaining in the ED until their evaluation is complete.  Routine labs CXR due to oxygen levels EKG   Karie Kirks 06/09/21 1644    Regan Lemming, MD 06/09/21 1859

## 2021-06-09 NOTE — ED Provider Notes (Signed)
[place] EMERGENCY DEPARTMENT Provider Note   CSN: [id number] Arrival date & time: 06/09/21  1636     History No chief complaint on file.   Jessica [name] is a 87 y.o. female with PMH CAD (DESx2 to mid and distal RCA in 2010), chronic combined CHF, hypertensive heart disease, DM with peripheral neuropathy, HTN, HLD, SSS s/p Medtronic pacemaker 2011, permanent atrial fib (discovered on device interrogation 10/2014), CKD stage III, moderate pulm HTN who presents the emergency department for evaluation of fatigue and shortness of breath.  When speaking with the patient, she is unsure why she is at the hospital, but history obtained from the patient's daughter who states that the patient is traditionally ambulatory and able to carry out all of her ADLs at home but tested positive for COVID yesterday and has been persistently weak and unable to get out of her chair.  Patient is unvaccinated and found to be 90% on room air via EMS.  Placed on 2 L nasal cannula and improved 100%.  Patient's daughter is also COVID-positive and is unable to care for the patient at home.  Patient denies chest pain, shortness of breath, abdominal pain, nausea, vomiting, fevers or other systemic symptoms.  She does endorse cough for 1 week.  HPI     Past Medical History:  Diagnosis Date   Breast cancer ([place])    CAD (coronary artery disease)    a. s/p DESx2 to mid and distal RCA in 2010.   Cholelithiasis 09/12/2013   Lap Chole on 09/14/13    Chronic combined systolic and diastolic CHF (congestive heart failure) ([place])    a. Echo 06/09/15: EF 55-60% -> 10/2017 EF 30-35% (pt and daughter did not wish to proceed with ischemic assessment - conservative rx).   CKD (chronic kidney disease), stage III (HCC)    Diabetes mellitus    NON INSULIN DEPENDENT   DJD (degenerative joint disease)    GERD (gastroesophageal reflux disease)    Hemorrhoids    History of diabetic neuropathy    Hypercholesterolemia     Hypertension    Hypertensive heart disease    Permanent atrial fibrillation ([place])    a. discovered on PPM interrogation 12/15- > eventually progressed to 100% atrial fib burden/permanent atrial fib.   SSS (sick sinus syndrome) (HCC)    a. s/p Medtronic PPM by JA for SSS and syncope 9/11.    Patient Active Problem List   Diagnosis Date Noted   Abdominal bloating 03/09/2020   Chronic constipation 03/09/2020   H/O bilateral mastectomy 10/13/2018   HX: breast cancer 10/13/2018   Permanent atrial fibrillation ([place]) 10/31/2017   Cardiac pacemaker in situ    Chronic combined systolic and diastolic CHF (congestive heart failure) ([place]) 10/12/2017   UTI (urinary tract infection) 03/28/2017   Bilateral leg edema 04/29/2016   CKD (chronic kidney disease) stage 3, GFR 30-59 ml/min ([place]) 06/10/2015   CAD (coronary artery disease) 06/09/2015   Diabetic neuropathy ([place]) 07/06/2013   SSS (sick sinus syndrome) ([place]) 02/10/2013   Hypertension 07/30/2012   Pacemaker-Medtronic 07/19/2012   Benign hypertensive heart disease with heart failure ([place]) 01/28/2012   Diabetes ([place]) 10/22/2010   HYPERCHOLESTEROLEMIA 10/22/2010   DEGENERATIVE JOINT DISEASE 10/22/2010    Past Surgical History:  Procedure Laterality Date   CARDIAC CATHETERIZATION  07/05/2009   EF 55-60%   CARDIOVERSION N/A 06/11/2015   Procedure: CARDIOVERSION;  Surgeon: [name], MD;  Location: [facility];  Service: Cardiovascular;  Laterality: N/A;  CHOLECYSTECTOMY N/A 09/14/2013   Procedure: LAPAROSCOPIC CHOLECYSTECTOMY WITH INTRAOPERATIVE CHOLANGIOGRAM;  Surgeon: [name], MD;  Location: [place];  Service: General;  Laterality: N/A;   COLONOSCOPY     ERCP N/A 09/13/2013   Procedure: ENDOSCOPIC RETROGRADE CHOLANGIOPANCREATOGRAPHY (ERCP);  Surgeon: [name], MD;  Location: [facility] [place] [facility];  Service: Endoscopy;  Laterality: N/A;   INSERT / REPLACE / REMOVE PACEMAKER  9/11   SSS and syncope, implanted by JA (MDT)    MASTECTOMY  1992   BILATERAL WITH RECONSTRUCTION     OB History   No obstetric history on file.     Family History  Problem Relation Age of Onset   Cancer Mother    Cancer Father     Social History   Tobacco Use   Smoking status: Never   Smokeless tobacco: Never  Vaping Use   Vaping Use: Never used  Substance Use Topics   Alcohol use: No    Alcohol/week: 0.0 standard drinks   Drug use: No    Home Medications Prior to Admission medications   Medication Sig Start Date End Date Taking? Authorizing Provider  acetaminophen (TYLENOL) 325 MG tablet Take 650 mg by mouth every 6 (six) hours as needed for fever or mild pain.   Yes [provider]  apixaban (ELIQUIS) 5 MG TABS tablet Take 1 tablet by mouth twice daily 05/13/21  Yes [name], Jeneen Rinks, MD  b complex vitamins tablet Take 1 tablet by mouth daily.   Yes [provider]  carvedilol (COREG) 25 MG tablet Take 1 tablet by mouth twice daily 01/30/21  Yes [name], Jeneen Rinks, MD  docusate sodium (COLACE) 100 MG capsule Take 100 mg by mouth at bedtime.   Yes [provider]  ECHINACEA PO Take 750 mg by mouth daily.   Yes [provider]  fexofenadine (ALLEGRA) 180 MG tablet Take 180 mg by mouth daily as needed for allergies.   Yes [provider]  fish oil-omega-3 fatty acids 1000 MG capsule Take 1 g by mouth every morning.   Yes [provider]  furosemide (LASIX) 20 MG tablet TAKE 2 TABLETS BY MOUTH IN THE MORNING, AND  1 TABLET IN THE AFTERNOON Patient taking differently: Take 20-40 mg by mouth 2 (two) times daily. TAKE 2 TABLETS BY MOUTH IN THE MORNING, AND  1 TABLET IN THE AFTERNOON 12/10/20  Yes [name], MD  gabapentin (NEURONTIN) 100 MG capsule TAKE 1 CAPSULE BY MOUTH NIGHTLY TAKE  IN  ADDITION  TO  300  MG  CAPSULE  AT  NIGHT Patient taking differently: Take 100 mg by mouth at bedtime. TAKE 1 CAPSULE BY MOUTH NIGHTLY TAKE  IN  ADDITION  TO  300  MG  CAPSULE  AT  NIGHT  04/26/20  Yes Burns, Claudina Lick, MD  gabapentin (NEURONTIN) 300 MG capsule TAKE 1 CAPSULE BY MOUTH THREE TIMES DAILY Patient taking differently: Take 300 mg by mouth 3 (three) times daily. 10/01/20  Yes [place], Claudina Lick, MD  lisinopril (ZESTRIL) 5 MG tablet TAKE 1 & 1/2 (ONE & ONE-HALF) TABLETS BY MOUTH ONCE DAILY Patient taking differently: Take 7.5 mg by mouth daily. 12/10/20  Yes [name], MD  Misc. Devices (ROLLATOR ULTRA-LIGHT) MISC With seat.  Diabetic neuropathy, knee arthritis 10/21/16  Yes [place], Claudina Lick, MD  nitroGLYCERIN (NITROSTAT) 0.4 MG SL tablet Place 1 tablet (0.4 mg total) under the tongue every 5 (five) minutes as needed for chest pain. x3 doses as needed for chest pain  10/21/16  Yes [place], Claudina Lick, MD  potassium chloride SA (KLOR-CON) 20 MEQ tablet TAKE 1  BY MOUTH ONCE DAILY Patient taking differently: Take 20 mEq by mouth daily. 04/29/21  Yes [place], Claudina Lick, MD    Allergies    Lyrica [pregabalin], Amlodipine, and Sulfonamide derivatives  Review of Systems   Review of Systems  Constitutional:  Positive for fatigue. Negative for chills and fever.  HENT:  Negative for ear pain and sore throat.   Eyes:  Negative for pain and visual disturbance.  Respiratory:  Positive for cough. Negative for shortness of breath.   Cardiovascular:  Negative for chest pain and palpitations.  Gastrointestinal:  Negative for abdominal pain and vomiting.  Genitourinary:  Negative for dysuria and hematuria.  Musculoskeletal:  Negative for arthralgias and back pain.  Skin:  Negative for color change and rash.  Neurological:  Negative for seizures and syncope.  All other systems reviewed and are negative.  Physical Exam Updated Vital Signs BP (!) 157/107 (BP Location: Right Arm)   Pulse 65   Temp 97.7 F (36.5 C) (Oral)   Resp (!) 27   LMP  (LMP Unknown)   SpO2 100%   Physical Exam Vitals and nursing note reviewed.  Constitutional:      General: She is not in acute distress.     Appearance: She is well-developed.  HENT:     Head: Normocephalic and atraumatic.  Eyes:     Conjunctiva/sclera: Conjunctivae normal.  Cardiovascular:     Rate and Rhythm: Normal rate and regular rhythm.     Heart sounds: No murmur heard. Pulmonary:     Effort: Pulmonary effort is normal. No respiratory distress.     Breath sounds: Normal breath sounds.  Abdominal:     Palpations: Abdomen is soft.     Tenderness: There is no abdominal tenderness.  Musculoskeletal:     Cervical back: Neck supple.  Skin:    General: Skin is warm and dry.  Neurological:     Mental Status: She is alert.    ED Results / Procedures / Treatments   Labs (all labs ordered are listed, but only abnormal results are displayed) Labs Reviewed  BASIC METABOLIC PANEL - Abnormal; Notable for the following components:      Result Value   Glucose, Bld 163 (*)    BUN 37 (*)    Creatinine, Ser 1.61 (*)    GFR, Estimated 30 (*)    All other components within normal limits  URINALYSIS, ROUTINE W REFLEX MICROSCOPIC - Abnormal; Notable for the following components:   Color, Urine AMBER (*)    APPearance CLOUDY (*)    Protein, ur 30 (*)    Nitrite POSITIVE (*)    Leukocytes,Ua LARGE (*)    Bacteria, UA MANY (*)    All other components within normal limits  RESP PANEL BY RT-PCR (FLU A&B, COVID) ARPGX2  CBC WITH DIFFERENTIAL/PLATELET  TROPONIN I (HIGH SENSITIVITY)    EKG None  Radiology DG Chest 1 View  Result Date: 06/09/2021 CLINICAL DATA:  SOB (shortness of breath) R06.02 (ICD-10-CM) EXAM: CHEST  1 VIEW COMPARISON:  10/30/2017. FINDINGS: Similar enlargement the cardiac silhouette. Calcific atherosclerosis of the aorta. Similar chronically prominent right paratracheal stripe, likely vascular. Similar left subclavian approach dual lead cardiac rhythm maintenance device. Similar chronic prominence of the interstitial markings. Similar linear/streaky opacity at the left lung base, likely atelectasis/scar. No  consolidation. No visible pleural effusions or pneumothorax. Right axillary clips. IMPRESSION: Chronic changes without  evidence of acute cardiopulmonary disease. Electronically Signed   By: Margaretha Sheffield MD   On: 06/09/2021 17:09    Procedures Procedures   Medications Ordered in ED Medications  cefTRIAXone (ROCEPHIN) 1 g in sodium chloride 0.9 % 100 mL IVPB (has no administration in time range)    ED Course  I have reviewed the triage vital signs and the nursing notes.  Pertinent labs & imaging results that were available during my care of the patient were reviewed by me and considered in my medical decision making (see chart for details).    MDM Rules/Calculators/A&P                           Patient seen in the emergency department for evaluation of shortness of breath, fatigue in setting of a known COVID-19 infection.  Physical exam is largely unremarkable.  Laboratory evaluation reveals a creatinine elevation of 1.61 which is an elevation for this patient, CBC unremarkable, UA with 21-50 white blood cells, positive nitrites, large leuk esterase and many bacteria.  Ceftriaxone administered.  Troponin is pending and COVID is pending.  As the patient now has a new oxygen requirement, and is having difficulty performing ADLs in the setting of a positive UTI, patient will require hospital admission. Final Clinical Impression(s) / ED Diagnoses Final diagnoses:  SOB (shortness of breath)    Rx / DC Orders ED Discharge Orders     None        Bellami Farrelly, Debe Coder, MD 06/09/21 [phone]

## 2021-06-10 ENCOUNTER — Other Ambulatory Visit: Payer: Self-pay

## 2021-06-10 ENCOUNTER — Observation Stay (HOSPITAL_COMMUNITY): Payer: Medicare Other

## 2021-06-10 DIAGNOSIS — K219 Gastro-esophageal reflux disease without esophagitis: Secondary | ICD-10-CM | POA: Diagnosis present

## 2021-06-10 DIAGNOSIS — N390 Urinary tract infection, site not specified: Secondary | ICD-10-CM | POA: Diagnosis present

## 2021-06-10 DIAGNOSIS — J1282 Pneumonia due to coronavirus disease 2019: Secondary | ICD-10-CM | POA: Diagnosis present

## 2021-06-10 DIAGNOSIS — G9341 Metabolic encephalopathy: Secondary | ICD-10-CM | POA: Diagnosis present

## 2021-06-10 DIAGNOSIS — Z955 Presence of coronary angioplasty implant and graft: Secondary | ICD-10-CM | POA: Diagnosis not present

## 2021-06-10 DIAGNOSIS — N179 Acute kidney failure, unspecified: Secondary | ICD-10-CM | POA: Diagnosis present

## 2021-06-10 DIAGNOSIS — U071 COVID-19: Secondary | ICD-10-CM

## 2021-06-10 DIAGNOSIS — E860 Dehydration: Secondary | ICD-10-CM | POA: Diagnosis present

## 2021-06-10 DIAGNOSIS — L89152 Pressure ulcer of sacral region, stage 2: Secondary | ICD-10-CM | POA: Diagnosis present

## 2021-06-10 DIAGNOSIS — J9601 Acute respiratory failure with hypoxia: Secondary | ICD-10-CM | POA: Diagnosis present

## 2021-06-10 DIAGNOSIS — E1142 Type 2 diabetes mellitus with diabetic polyneuropathy: Secondary | ICD-10-CM | POA: Diagnosis present

## 2021-06-10 DIAGNOSIS — I2510 Atherosclerotic heart disease of native coronary artery without angina pectoris: Secondary | ICD-10-CM | POA: Diagnosis present

## 2021-06-10 DIAGNOSIS — E1122 Type 2 diabetes mellitus with diabetic chronic kidney disease: Secondary | ICD-10-CM | POA: Diagnosis present

## 2021-06-10 DIAGNOSIS — I495 Sick sinus syndrome: Secondary | ICD-10-CM | POA: Diagnosis present

## 2021-06-10 DIAGNOSIS — I130 Hypertensive heart and chronic kidney disease with heart failure and stage 1 through stage 4 chronic kidney disease, or unspecified chronic kidney disease: Secondary | ICD-10-CM | POA: Diagnosis present

## 2021-06-10 DIAGNOSIS — Z79899 Other long term (current) drug therapy: Secondary | ICD-10-CM | POA: Diagnosis not present

## 2021-06-10 DIAGNOSIS — Z853 Personal history of malignant neoplasm of breast: Secondary | ICD-10-CM | POA: Diagnosis not present

## 2021-06-10 DIAGNOSIS — E7800 Pure hypercholesterolemia, unspecified: Secondary | ICD-10-CM | POA: Diagnosis present

## 2021-06-10 DIAGNOSIS — N1832 Chronic kidney disease, stage 3b: Secondary | ICD-10-CM | POA: Diagnosis present

## 2021-06-10 DIAGNOSIS — I5042 Chronic combined systolic (congestive) and diastolic (congestive) heart failure: Secondary | ICD-10-CM | POA: Diagnosis present

## 2021-06-10 DIAGNOSIS — B961 Klebsiella pneumoniae [K. pneumoniae] as the cause of diseases classified elsewhere: Secondary | ICD-10-CM | POA: Diagnosis present

## 2021-06-10 DIAGNOSIS — I4821 Permanent atrial fibrillation: Secondary | ICD-10-CM | POA: Diagnosis present

## 2021-06-10 DIAGNOSIS — Z9013 Acquired absence of bilateral breasts and nipples: Secondary | ICD-10-CM | POA: Diagnosis not present

## 2021-06-10 DIAGNOSIS — Z28310 Unvaccinated for covid-19: Secondary | ICD-10-CM | POA: Diagnosis not present

## 2021-06-10 LAB — HEPATIC FUNCTION PANEL
ALT: 23 U/L (ref 0–44)
AST: 32 U/L (ref 15–41)
Albumin: 3 g/dL — ABNORMAL LOW (ref 3.5–5.0)
Alkaline Phosphatase: 84 U/L (ref 38–126)
Bilirubin, Direct: 0.3 mg/dL — ABNORMAL HIGH (ref 0.0–0.2)
Indirect Bilirubin: 0.5 mg/dL (ref 0.3–0.9)
Total Bilirubin: 0.8 mg/dL (ref 0.3–1.2)
Total Protein: 6.2 g/dL — ABNORMAL LOW (ref 6.5–8.1)

## 2021-06-10 LAB — BASIC METABOLIC PANEL
Anion gap: 9 (ref 5–15)
BUN: 40 mg/dL — ABNORMAL HIGH (ref 8–23)
CO2: 22 mmol/L (ref 22–32)
Calcium: 9.5 mg/dL (ref 8.9–10.3)
Chloride: 107 mmol/L (ref 98–111)
Creatinine, Ser: 1.46 mg/dL — ABNORMAL HIGH (ref 0.44–1.00)
GFR, Estimated: 34 mL/min — ABNORMAL LOW (ref >60)
Glucose, Bld: 118 mg/dL — ABNORMAL HIGH (ref 70–99)
Potassium: 4.7 mmol/L (ref 3.5–5.1)
Sodium: 138 mmol/L (ref 135–145)

## 2021-06-10 LAB — RESP PANEL BY RT-PCR (FLU A&B, COVID) ARPGX2
Influenza A by PCR: NEGATIVE
Influenza B by PCR: NEGATIVE
SARS Coronavirus 2 by RT PCR: POSITIVE — AB

## 2021-06-10 LAB — FIBRINOGEN: Fibrinogen: 426 mg/dL (ref 210–475)

## 2021-06-10 LAB — HEMOGLOBIN A1C
Hgb A1c MFr Bld: 6.8 % — ABNORMAL HIGH (ref 4.8–5.6)
Mean Plasma Glucose: 148.46 mg/dL

## 2021-06-10 LAB — C-REACTIVE PROTEIN: CRP: 2.5 mg/dL — ABNORMAL HIGH (ref <1.0)

## 2021-06-10 LAB — CBG MONITORING, ED
Glucose-Capillary: 119 mg/dL — ABNORMAL HIGH (ref 70–99)
Glucose-Capillary: 272 mg/dL — ABNORMAL HIGH (ref 70–99)
Glucose-Capillary: 180 mg/dL — ABNORMAL HIGH (ref 70–99)

## 2021-06-10 LAB — FERRITIN: Ferritin: 176 ng/mL (ref 11–307)

## 2021-06-10 LAB — LACTATE DEHYDROGENASE: LDH: 225 U/L — ABNORMAL HIGH (ref 98–192)

## 2021-06-10 LAB — D-DIMER, QUANTITATIVE: D-Dimer, Quant: >20 ug/mL-FEU — ABNORMAL HIGH (ref 0.00–0.50)

## 2021-06-10 LAB — TROPONIN I (HIGH SENSITIVITY): Troponin I (High Sensitivity): 46 ng/L — ABNORMAL HIGH (ref <18)

## 2021-06-10 MED ORDER — CARVEDILOL 25 MG PO TABS
25.0000 mg | ORAL_TABLET | Freq: Two times a day (BID) | ORAL | Status: DC
  Administered 2021-06-10 – 2021-06-14 (×8): 25 mg via ORAL
  Filled 2021-06-10 (×3): qty 1
  Filled 2021-06-10: qty 2
  Filled 2021-06-10 (×4): qty 1

## 2021-06-10 MED ORDER — INSULIN ASPART 100 UNIT/ML IJ SOLN
0.0000 [IU] | Freq: Three times a day (TID) | INTRAMUSCULAR | Status: DC
Start: 2021-06-10 — End: 2021-06-14
  Administered 2021-06-10 – 2021-06-11 (×2): 3 [IU] via SUBCUTANEOUS
  Administered 2021-06-11: 2 [IU] via SUBCUTANEOUS
  Administered 2021-06-11: 1 [IU] via SUBCUTANEOUS
  Administered 2021-06-12 – 2021-06-13 (×3): 3 [IU] via SUBCUTANEOUS
  Administered 2021-06-13 – 2021-06-14 (×3): 2 [IU] via SUBCUTANEOUS

## 2021-06-10 MED ORDER — APIXABAN 5 MG PO TABS
5.0000 mg | ORAL_TABLET | Freq: Two times a day (BID) | ORAL | Status: DC
  Administered 2021-06-10 – 2021-06-14 (×9): 5 mg via ORAL
  Filled 2021-06-10 (×9): qty 1

## 2021-06-10 MED ORDER — SODIUM CHLORIDE 0.9 % IV SOLN: INTRAVENOUS | Status: AC

## 2021-06-10 MED ORDER — SODIUM CHLORIDE 0.9 % IV SOLN
100.0000 mg | Freq: Every day | INTRAVENOUS | Status: AC
  Administered 2021-06-11 – 2021-06-14 (×4): 100 mg via INTRAVENOUS
  Filled 2021-06-10 (×3): qty 20
  Filled 2021-06-10: qty 100

## 2021-06-10 MED ORDER — GABAPENTIN 100 MG PO CAPS: 400.0000 mg | ORAL_CAPSULE | Freq: Every day | ORAL | Status: DC

## 2021-06-10 MED ORDER — GABAPENTIN 400 MG PO CAPS
400.0000 mg | ORAL_CAPSULE | Freq: Every day | ORAL | Status: DC
  Administered 2021-06-10 – 2021-06-13 (×5): 400 mg via ORAL
  Filled 2021-06-10 (×5): qty 1

## 2021-06-10 MED ORDER — SODIUM CHLORIDE 0.9 % IV SOLN
200.0000 mg | Freq: Once | INTRAVENOUS | Status: AC
  Administered 2021-06-10: 200 mg via INTRAVENOUS
  Filled 2021-06-10: qty 40

## 2021-06-10 MED ORDER — LINAGLIPTIN 5 MG PO TABS
5.0000 mg | ORAL_TABLET | Freq: Every day | ORAL | Status: DC
  Administered 2021-06-11 – 2021-06-14 (×4): 5 mg via ORAL
  Filled 2021-06-10 (×4): qty 1

## 2021-06-10 MED ORDER — DEXAMETHASONE SODIUM PHOSPHATE 10 MG/ML IJ SOLN
6.0000 mg | INTRAMUSCULAR | Status: DC
  Administered 2021-06-10 – 2021-06-11 (×2): 6 mg via INTRAVENOUS
  Filled 2021-06-10 (×2): qty 1

## 2021-06-10 MED ORDER — GABAPENTIN 300 MG PO CAPS
300.0000 mg | ORAL_CAPSULE | Freq: Two times a day (BID) | ORAL | Status: DC
  Administered 2021-06-10 – 2021-06-14 (×8): 300 mg via ORAL
  Filled 2021-06-10 (×8): qty 1

## 2021-06-10 MED ORDER — INSULIN ASPART 100 UNIT/ML IJ SOLN
0.0000 [IU] | Freq: Every day | INTRAMUSCULAR | Status: DC
Start: 2021-06-10 — End: 2021-06-14
  Administered 2021-06-11: 2 [IU] via SUBCUTANEOUS
  Administered 2021-06-12 – 2021-06-13 (×2): 3 [IU] via SUBCUTANEOUS

## 2021-06-10 NOTE — Progress Notes (Signed)
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  Reports positive home COVID test yesterday.  Unvaccinated.  Patient is confused, not sure why she is in the hospital.  Patient complaining of cough and urinary frequency.  On EMS arrival, patient was noted to be oxygenating 90% on room air, placed on 2 L nasal cannula with improvement to 96%.  In the ED, temperature 98.8 F, HR 66, RR 25, BP 124/61, SPO2 94% on 2 L nasal cannula at rest.  Sodium 139, potassium 4.5, chloride 107, CO2 26, glucose 163, BUN 37, creatinine 1.61.  WBC 5.6, hemoglobin 13.5, platelets 192.  COVID-19 PCR positive.  Influenza A/B PCR negative.  Urinalysis with large leukocytes, positive nitrite, many bacteria, 21-50 WBCs. CT head without contrast with chronic atrophic and ischemic changes without acute abnormality.  CT chest without contrast with small right-sided pleural effusion, bibasilar atelectatic changes, air trapping no focal consolidation.  Scattered hypodensities throughout the thyroid gland.   Assessment & Plan:   Principal Problem:   Acute hypoxemic respiratory failure (HCC) Active Problems:   Diabetes ([place])   Hypertension   UTI (urinary tract infection)   Acute-on-chronic kidney injury ([place])   Acute respiratory failure with hypoxia (HCC)   Acute hypoxic respiratory failure secondary to acute Covid-19 viral pneumonia during the ongoing Covid 19 Pandemic - POA Patient presenting from home with  generalized weakness, malaise, fatigue and shortness of breath.  Found to be hypoxic with SPO2 90% on room air.  No history of lung disease.  Reported home COVID test positive. --COVID test: 06/09/2021 --CRP 2.5 --ddimer >20 --Remdesivir, plan 5-day course (Day #1/5) --Continue dexamethasone 6 mg IV every 24 hours x10 days --prone for 2-3hrs every 12hrs if able --Continue supplemental oxygen, titrate to maintain SPO2 greater than 92% --Continue supportive care with albuterol MDI prn, vitamin C, zinc, Tylenol, antitussives  --Incentive spirometry/flutter valve --Follow CBC, CMP, D-dimer, ferritin, and CRP daily --Continue airborne/contact isolation   The treatment plan and use of medications and known side effects were discussed with patient/family. Some of the medications used are based on case reports/anecdotal data.  All other medications being used in the management of COVID-19 based on limited study data.  Complete risks and long-term side effects are unknown, however in the best clinical judgment they seem to be of some benefit.  Patient wanted to proceed with treatment options provided.  Acute renal failure on CKD stage IIIb Patient presenting with creatinine 1.61.  Baseline creatinine 1.13.  Etiology likely secondary to prerenal azotemia in the setting of poor oral intake. --Cr 1.61>1.46 --Hold home ACE inhibitor and Lasix --Avoid nephrotoxins, renally dose all medications --Repeat BMP in the a.m.  Urinary tract infection Patient reported urinary frequency.  Urinalysis with large leukocytes, positive nitrite, many bacteria and 21-50 WBCs consistent with UTI.  History of previous E. coli, Enterobacter, Raoultella UTI --Urine culture: Pending --Ceftriaxone 1 g IV every 24 hours  CAD s/p PCI Underwent PCI to mid and distal RCA 2010.  Not on aspirin given need for anticoagulation.  Previously on pravastatin but was discontinued due to memory issues.  On omega-3 fatty acids at  home. --Continue carvedilol 25 mg p.o. twice daily --Hold lisinopril --On Eliquis  Chronic combined systolic/diastolic congestive heart failure, compensated TTE 11/01/2017 with LVEF 30-35%, moderate diffuse hypokinesis, mild MR.  Follows with cardiology outpatient, Dr. Johney Frame. --Holding lisinopril and furosemide due to AKI as above --Continue carvedilol 25 mg p.o. twice daily --Strict I's and O's and daily weights  Permanent atrial fibrillation on anticoagulation SSS s/p PPM Follows with electrophysiology, Dr. [name] outpatient.  --carvedilol 25 mg p.o. twice daily --Eliquis 5 mg p.o. twice daily --Continue to monitor on telemetry  Type 2 diabetes mellitus Hemoglobin A1c 6.8, well controlled.  Diet controlled at home. --SSI for coverage --CBG qAC/HS  Peripheral neuropathy --Gabapentin 300 mg p.o. twice daily, 40 mg p.o. nightly  Essential hypertension --Carvedilol 25 mg p.o. twice daily --Holding lisinopril and furosemide as above for AKI  Hyperlipidemia: Previous on pravastatin, DC'd for memory issues.  On omega-3 fatty acids outpatient.  Thyroid hypodensities Incidental finding of thyroid hypodensities on CT chest.  Given age, no follow-up required.  Weakness/deconditioning: --PT/OT evaluation   DVT prophylaxis:  apixaban (ELIQUIS) tablet 5 mg    Code Status: Full Code Family Communication: Attempted to update patient's daughter, [name] via telephone, unsuccessful.  Disposition Plan:  Level of care: Telemetry Medical Status is: Inpatient  Remains inpatient appropriate because:Altered mental status, Ongoing diagnostic testing needed not appropriate for outpatient work up, Unsafe d/c plan, IV treatments appropriate due to intensity of illness or inability to take PO, and Inpatient level of care appropriate due to severity of illness  Dispo: The patient is from: Home              Anticipated d/c is to:  To be determined              Patient currently is not  medically stable to d/c.   Difficult to place patient No  Consultants:  None  Procedures:  None  Antimicrobials:  Remdesivir   Subjective: Patient seen examined bedside, resting comfortably.  Continues in ED holding area.  Pleasantly confused.  Continues with mild shortness of breath, requiring supplemental oxygen.  No family present.  Reports not too hungry this morning.  No other specific complaints or concerns at this time.  Denies headache, no chest pain, no palpitations, no abdominal pain.  No acute concerns at this point per nursing staff.  Objective: Vitals:   06/10/21 1100 06/10/21 1200 06/10/21 1300 06/10/21 1400  BP: (!) 152/59 (!) 140/104 (!) 121/52 (!) 134/59  Pulse: (!) 28 67 (!) 59 62  Resp: (!) 24 (!) '23 19 18  '$ Temp:      TempSrc:      SpO2: 99% 96% 91% 99%  Weight:      Height:        Intake/Output Summary (Last 24 hours) at 06/10/2021 1429 Last data filed at 06/10/2021 0615 Gross per 24 hour  Intake 250 ml  Output --  Net 250 ml   Filed Weights   06/10/21 0013  Weight: 70.3 kg    Examination:  General exam: Appears calm and comfortable, elderly and chronically ill in appearance Respiratory system: Decreased breath sounds bilateral bases, otherwise clear to auscultation without wheezing/crackles, normal respiratory effort on 2 L nasal cannula with SPO2 98% at rest Cardiovascular system: S1 & S2 heard, RRR. No JVD, murmurs, rubs, gallops or clicks. No pedal edema. Gastrointestinal system: Abdomen is nondistended, soft and nontender.  No organomegaly or masses felt. Normal bowel sounds heard. Central nervous system: Alert. No focal neurological deficits. Extremities: Moves all extremities independently, no peripheral edema Skin: No rashes, lesions or ulcers Psychiatry: Judgement and insight appear poor. Mood & affect appropriate.     Data Reviewed: I have personally reviewed following labs and imaging studies  CBC: Recent Labs  Lab 06/09/21 1649   WBC 5.6  NEUTROABS 3.5  HGB 13.5  HCT 42.1  MCV 93.6  PLT [id number]   Basic Metabolic Panel: Recent Labs  Lab 06/09/21 1649 06/10/21 0415  NA 139 138  K 4.5 4.7  CL 107 107  CO2 26 22  GLUCOSE 163* 118*  BUN 37* 40*  CREATININE 1.61* 1.46*  CALCIUM 9.8 9.5   GFR: Estimated Creatinine Clearance: 23.6 mL/min (A) (by C-G formula based on SCr of 1.46 mg/dL (H)). Liver Function Tests: Recent Labs  Lab 06/10/21 0415  AST 32  ALT 23  ALKPHOS 84  BILITOT 0.8  PROT 6.2*  ALBUMIN 3.0*   No results for input(s): LIPASE, AMYLASE in the last 168 hours. No results for input(s): AMMONIA in the last 168 hours. Coagulation Profile: No results for input(s): INR, PROTIME in the last 168 hours. Cardiac Enzymes: No results for input(s): CKTOTAL, CKMB, CKMBINDEX, TROPONINI in the last 168 hours. BNP (last 3 results) Recent Labs    09/03/20 1637  PROBNP 4,231*   HbA1C: Recent Labs    06/10/21 0412  HGBA1C 6.8*   CBG: Recent Labs  Lab 06/10/21 0714 06/10/21 1144  GLUCAP 119* 272*   Lipid Profile: No results for input(s): CHOL, HDL, LDLCALC, TRIG, CHOLHDL, LDLDIRECT in the last 72 hours. Thyroid Function Tests: No results for input(s): TSH, T4TOTAL, FREET4, T3FREE, THYROIDAB in the last 72 hours. Anemia Panel: Recent Labs    06/10/21 0413  FERRITIN 176   Sepsis Labs: No results for input(s): PROCALCITON, LATICACIDVEN in the last 168 hours.  Recent Results (from the past 240 hour(s))  Resp Panel by RT-PCR (Flu A&B, Covid) Nasopharyngeal Swab     Status: Abnormal   Collection Time: 06/10/21 12:12 AM   Specimen: Nasopharyngeal Swab; Nasopharyngeal(NP) swabs in vial transport medium  Result Value Ref Range Status   SARS Coronavirus 2 by RT PCR POSITIVE (A) NEGATIVE Final    Comment: RESULT CALLED TO, READ BACK BY AND VERIFIED WITH: S ROWLAND,RN'@0123'$  06/10/21 MKELLY (NOTE) SARS-CoV-2 target nucleic acids are DETECTED.  The SARS-CoV-2 RNA is generally detectable in  upper respiratory specimens during the acute phase of infection. Positive results are indicative of the presence of the identified virus, but do not rule out bacterial infection or co-infection with other pathogens not detected by the test. Clinical correlation with patient history and other diagnostic information is necessary to determine patient infection status. The expected result is Negative.  Fact Sheet for Patients: EntrepreneurPulse.com.au  Fact Sheet for Healthcare Providers: IncredibleEmployment.be  This test is not yet approved or cleared by the [place] FDA and  has been authorized for detection and/or diagnosis of SARS-CoV-2 by FDA under an Emergency Use Authorization (EUA).  This EUA will remain in effect (meaning this test can be  used) for the duration of  the COVID-19 declaration under Section 564(b)(1) of the Act, 21 U.S.C. section 360bbb-3(b)(1), unless the authorization is terminated or revoked sooner.     Influenza A by PCR NEGATIVE NEGATIVE Final   Influenza B by PCR NEGATIVE NEGATIVE Final    Comment: (NOTE) The Xpert Xpress SARS-CoV-2/FLU/RSV plus assay is intended  as an aid in the diagnosis of influenza from Nasopharyngeal swab specimens and should not be used as a sole basis for treatment. Nasal washings and aspirates are unacceptable for Xpert Xpress SARS-CoV-2/FLU/RSV testing.  Fact Sheet for Patients: EntrepreneurPulse.com.au  Fact Sheet for Healthcare Providers: IncredibleEmployment.be  This test is not yet approved or cleared by the [place] FDA and has been authorized for detection and/or diagnosis of SARS-CoV-2 by FDA under an Emergency Use Authorization (EUA). This EUA will remain in effect (meaning this test can be used) for the duration of the COVID-19 declaration under Section 564(b)(1) of the Act, 21 U.S.C. section 360bbb-3(b)(1), unless the authorization is  terminated or revoked.  Performed at [facility], [employer] [address], [place], [place] [address]          Radiology Studies: DG Chest 1 View  Result Date: 06/09/2021 CLINICAL DATA:  SOB (shortness of breath) R06.02 (ICD-10-CM) EXAM: CHEST  1 VIEW COMPARISON:  10/30/2017. FINDINGS: Similar enlargement the cardiac silhouette. Calcific atherosclerosis of the aorta. Similar chronically prominent right paratracheal stripe, likely vascular. Similar left subclavian approach dual lead cardiac rhythm maintenance device. Similar chronic prominence of the interstitial markings. Similar linear/streaky opacity at the left lung base, likely atelectasis/scar. No consolidation. No visible pleural effusions or pneumothorax. Right axillary clips. IMPRESSION: Chronic changes without evidence of acute cardiopulmonary disease. Electronically Signed   By: Margaretha Sheffield MD   On: 06/09/2021 17:09   CT HEAD WO CONTRAST (5MM)  Result Date: 06/10/2021 CLINICAL DATA:  Altered mental status, history of COVID-19 positivity EXAM: CT HEAD WITHOUT CONTRAST TECHNIQUE: Contiguous axial images were obtained from the base of the skull through the vertex without intravenous contrast. COMPARISON:  12/23/2014 FINDINGS: Brain: No evidence of acute infarction, hemorrhage, hydrocephalus, extra-axial collection or mass lesion/mass effect. Generalized atrophic changes are noted. Mild chronic white matter ischemic changes are seen as well. Vascular: No hyperdense vessel or unexpected calcification. Skull: Normal. Negative for fracture or focal lesion. Sinuses/Orbits: No acute finding. Other: None. IMPRESSION: Chronic atrophic and ischemic changes without acute abnormality. Electronically Signed   By: Inez Catalina M.D.   On: 06/10/2021 02:34   CT CHEST WO CONTRAST  Result Date: 06/10/2021 CLINICAL DATA:  Respiratory failure, history of COVID-19 infection EXAM: CT CHEST WITHOUT CONTRAST TECHNIQUE: Multidetector CT imaging of the chest  was performed following the standard protocol without IV contrast. COMPARISON:  Chest x-ray from the previous day. FINDINGS: Cardiovascular: Limited due to the lack of IV contrast. Atherosclerotic calcifications are noted. No aneurysmal dilatation is seen. Cardiac enlargement is noted. Pacing device is again seen. Coronary calcifications are noted. No enlargement of the pulmonary artery is seen. Mediastinum/Nodes: Thoracic inlet is within normal limits with the exception of scattered hypodensities throughout the thyroid. No discrete nodule is seen. No significant enlargement is noted. No sizable hilar or mediastinal adenopathy is noted. The esophagus is within normal limits. Lungs/Pleura: Small right-sided pleural effusion is noted. Mild bibasilar atelectatic changes are seen. No focal confluent infiltrate is noted. Mosaic attenuation is noted consistent with air trapping. No significant edema is noted. No sizable parenchymal nodule is seen. Upper Abdomen: Visualized upper abdomen show cystic lesions within the spleen similar to that noted on prior CT and ultrasound examination consistent with cysts. Musculoskeletal: Degenerative changes of the thoracic spine are noted. No acute rib abnormality is noted. IMPRESSION: Small right-sided pleural effusion. Bibasilar atelectatic changes as well as changes of air trapping. No focal confluent infiltrate is seen. Scattered hypodensities are noted throughout the  thyroid. Due to the patient's age, no follow-up recommended unless clinically warranted (ref: J Am Coll Radiol. 2015 Feb;12(2): 143-50). Aortic Atherosclerosis (ICD10-I70.0). Electronically Signed   By: Inez Catalina M.D.   On: 06/10/2021 02:42        Scheduled Meds:  apixaban  5 mg Oral BID   carvedilol  25 mg Oral BID WC   dexamethasone (DECADRON) injection  6 mg Intravenous Q24H   gabapentin  300 mg Oral BID   gabapentin  400 mg Oral QHS   insulin aspart  0-5 Units Subcutaneous QHS   insulin aspart  0-6  Units Subcutaneous TID WC   Continuous Infusions:  cefTRIAXone (ROCEPHIN)  IV Stopped (06/10/21 0310)   [START ON 06/11/2021] remdesivir 100 mg in NS 100 mL       LOS: 0 days    Time spent: 42 minutes spent on chart review, discussion with nursing staff, consultants, updating family and interview/physical exam; more than 50% of that time was spent in counseling and/or coordination of care.    Ananya Mccleese J [place] DO [facility] Available via Epic secure chat 7am-7pm After these hours, please refer to coverage provider listed on [email or web address] 06/10/2021, 2:29 PM

## 2021-06-10 NOTE — H&P (Addendum)
History and Physical    Jessica [name] [id number] DOB: Apr 24, 1927 DOA: 06/09/2021  PCP: Binnie Rail, MD Patient coming from: Home  Chief Complaint: [name]  HPI: Jessica [name] is a 87 y.o. female with medical history significant of CAD status post PCI, chronic combined CHF, permanent A. fib on Eliquis, sick sinus syndrome status post PPM, CKD stage IIIb, diet-controlled type II diabetes, history of breast cancer, GERD, hypertension, hyperlipidemia presented to the ED via EMS for evaluation of malaise and low-grade fever.  She had a positive home COVID test yesterday.  She is unvaccinated.  Satting 90% on room air with EMS, sats improved to 96% with 2 L supplemental oxygen.  In the ED, afebrile and not tachycardic.  Satting well on 2 L supplemental oxygen.  Labs showing WBC 5.6, hemoglobin 13.5, platelet count 192.  Sodium 139, potassium 4.5, chloride 107, bicarb 26, BUN 37, creatinine 1.6 (baseline 1.1), glucose 163.  UA with positive nitrite, large amount of leukocytes, 21-50 WBCs, and many bacteria.  SARS-CoV-2 PCR test pending.  Troponin pending.  Chest x-ray showing chronic changes without evidence of acute cardiopulmonary disease. Patient was given ceftriaxone.  Patient is confused.  She is not sure why she is here and states her daughter sent her to the hospital.  She is complaining of cough and urinary frequency.  No other complaints.  Denies fevers, shortness of breath, chest pain, nausea, vomiting, abdominal pain, or diarrhea.  States her appetite is "too good."  She confirms that she is not vaccinated against COVID.  However, she is not sure if she had a home COVID test done.  Review of Systems:  All systems reviewed and apart from history of presenting illness, are negative.  Past Medical History:  Diagnosis Date   Breast cancer ([place])    CAD (coronary artery disease)    a. s/p DESx2 to mid and distal RCA in 2010.   Cholelithiasis 09/12/2013   Lap Chole on 09/14/13    Chronic  combined systolic and diastolic CHF (congestive heart failure) ([place])    a. Echo 06/09/15: EF 55-60% -> 10/2017 EF 30-35% (pt and daughter did not wish to proceed with ischemic assessment - conservative rx).   CKD (chronic kidney disease), stage III (HCC)    Diabetes mellitus    NON INSULIN DEPENDENT   DJD (degenerative joint disease)    GERD (gastroesophageal reflux disease)    Hemorrhoids    History of diabetic neuropathy    Hypercholesterolemia    Hypertension    Hypertensive heart disease    Permanent atrial fibrillation ([place])    a. discovered on PPM interrogation 12/15- > eventually progressed to 100% atrial fib burden/permanent atrial fib.   SSS (sick sinus syndrome) (HCC)    a. s/p [employer] PPM by JA for SSS and syncope 9/11.    Past Surgical History:  Procedure Laterality Date   CARDIAC CATHETERIZATION  07/05/2009   EF 55-60%   CARDIOVERSION N/A 06/11/2015   Procedure: CARDIOVERSION;  Surgeon: [name], MD;  Location: [facility];  Service: Cardiovascular;  Laterality: N/A;   CHOLECYSTECTOMY N/A 09/14/2013   Procedure: LAPAROSCOPIC CHOLECYSTECTOMY WITH INTRAOPERATIVE CHOLANGIOGRAM;  Surgeon: [name], MD;  Location: [facility];  Service: General;  Laterality: N/A;   COLONOSCOPY     ERCP N/A 09/13/2013   Procedure: ENDOSCOPIC RETROGRADE CHOLANGIOPANCREATOGRAPHY (ERCP);  Surgeon: [name], MD;  Location: [facility] ENDOSCOPY;  Service: Endoscopy;  Laterality: N/A;   INSERT / REPLACE / REMOVE PACEMAKER  9/11  SSS and syncope, implanted by JA (MDT)   [place]     reports that she has never smoked. She has never used smokeless tobacco. She reports that she does not drink alcohol and does not use drugs.  Allergies  Allergen Reactions   Lyrica [Pregabalin] Swelling    Caused weight gain   Amlodipine Swelling   Sulfonamide Derivatives Rash    Family History  Problem Relation Age of Onset   Cancer Mother    Cancer Father      Prior to Admission medications   Medication Sig Start Date End Date Taking? Authorizing Provider  acetaminophen (TYLENOL) 325 MG tablet Take 650 mg by mouth every 6 (six) hours as needed for fever or mild pain.   Yes [provider]  apixaban (ELIQUIS) 5 MG TABS tablet Take 1 tablet by mouth twice daily 05/13/21  Yes [name], Jeneen Rinks, MD  b complex vitamins tablet Take 1 tablet by mouth daily.   Yes [provider]  carvedilol (COREG) 25 MG tablet Take 1 tablet by mouth twice daily 01/30/21  Yes [name], Jeneen Rinks, MD  docusate sodium (COLACE) 100 MG capsule Take 100 mg by mouth at bedtime.   Yes [provider]  ECHINACEA PO Take 750 mg by mouth daily.   Yes [provider]  fexofenadine (ALLEGRA) 180 MG tablet Take 180 mg by mouth daily as needed for allergies.   Yes [provider]  fish oil-omega-3 fatty acids 1000 MG capsule Take 1 g by mouth every morning.   Yes [provider]  furosemide (LASIX) 20 MG tablet TAKE 2 TABLETS BY MOUTH IN THE MORNING, AND  1 TABLET IN THE AFTERNOON Patient taking differently: Take 20-40 mg by mouth 2 (two) times daily. TAKE 2 TABLETS BY MOUTH IN THE MORNING, AND  1 TABLET IN THE AFTERNOON 12/10/20  Yes [name], MD  gabapentin (NEURONTIN) 100 MG capsule TAKE 1 CAPSULE BY MOUTH NIGHTLY TAKE  IN  ADDITION  TO  300  MG  CAPSULE  AT  NIGHT Patient taking differently: Take 100 mg by mouth at bedtime. TAKE 1 CAPSULE BY MOUTH NIGHTLY TAKE  IN  ADDITION  TO  300  MG  CAPSULE  AT  NIGHT 04/26/20  Yes Burns, Claudina Lick, MD  gabapentin (NEURONTIN) 300 MG capsule TAKE 1 CAPSULE BY MOUTH THREE TIMES DAILY Patient taking differently: Take 300 mg by mouth 3 (three) times daily. 10/01/20  Yes Burns, Claudina Lick, MD  lisinopril (ZESTRIL) 5 MG tablet TAKE 1 & 1/2 (ONE & ONE-HALF) TABLETS BY MOUTH ONCE DAILY Patient taking differently: Take 7.5 mg by mouth daily. 12/10/20  Yes [name], MD  Misc. Devices (ROLLATOR  ULTRA-LIGHT) MISC With seat.  Diabetic neuropathy, knee arthritis 10/21/16  Yes Burns, Claudina Lick, MD  nitroGLYCERIN (NITROSTAT) 0.4 MG SL tablet Place 1 tablet (0.4 mg total) under the tongue every 5 (five) minutes as needed for chest pain. x3 doses as needed for chest pain 10/21/16  Yes Burns, Claudina Lick, MD  potassium chloride SA (KLOR-CON) 20 MEQ tablet TAKE 1  BY MOUTH ONCE DAILY Patient taking differently: Take 20 mEq by mouth daily. 04/29/21  Yes Binnie Rail, MD    Physical Exam: Vitals:   06/09/21 2330 06/09/21 2345 06/10/21 0000 06/10/21 0013  BP: 98/69 (!) 116/92 132/86   Pulse: 60 63 (!) 58   Resp: '13 16 19   '$ Temp:      TempSrc:  SpO2: 100% 100% 100%   Weight:    70.3 kg  Height:    '5\' 4"'$  (1.626 m)    Physical Exam Constitutional:      General: She is not in acute distress. HENT:     Head: Normocephalic and atraumatic.     Mouth/Throat:     Mouth: Mucous membranes are dry.  Eyes:     Extraocular Movements: Extraocular movements intact.     Conjunctiva/sclera: Conjunctivae normal.  Cardiovascular:     Rate and Rhythm: Normal rate and regular rhythm.     Pulses: Normal pulses.  Pulmonary:     Effort: Pulmonary effort is normal. No respiratory distress.     Breath sounds: Normal breath sounds. No wheezing or rales.  Abdominal:     General: Bowel sounds are normal.     Palpations: Abdomen is soft.     Tenderness: There is no abdominal tenderness. There is no guarding.  Musculoskeletal:        General: No swelling or tenderness.     Cervical back: Normal range of motion and neck supple.  Skin:    General: Skin is warm and dry.  Neurological:     General: No focal deficit present.     Mental Status: She is alert and oriented to person, place, and time.     Cranial Nerves: No cranial nerve deficit.     Sensory: No sensory deficit.     Motor: No weakness.     Comments: Confused     Labs on Admission: I have personally reviewed following labs and imaging  studies  CBC: Recent Labs  Lab 06/09/21 1649  WBC 5.6  NEUTROABS 3.5  HGB 13.5  HCT 42.1  MCV 93.6  PLT [id number]   Basic Metabolic Panel: Recent Labs  Lab 06/09/21 1649  NA 139  K 4.5  CL 107  CO2 26  GLUCOSE 163*  BUN 37*  CREATININE 1.61*  CALCIUM 9.8   GFR: Estimated Creatinine Clearance: 21.4 mL/min (A) (by C-G formula based on SCr of 1.61 mg/dL (H)). Liver Function Tests: No results for input(s): AST, ALT, ALKPHOS, BILITOT, PROT, ALBUMIN in the last 168 hours. No results for input(s): LIPASE, AMYLASE in the last 168 hours. No results for input(s): AMMONIA in the last 168 hours. Coagulation Profile: No results for input(s): INR, PROTIME in the last 168 hours. Cardiac Enzymes: No results for input(s): CKTOTAL, CKMB, CKMBINDEX, TROPONINI in the last 168 hours. BNP (last 3 results) Recent Labs    09/03/20 1637  PROBNP 4,231*   HbA1C: No results for input(s): HGBA1C in the last 72 hours. CBG: No results for input(s): GLUCAP in the last 168 hours. Lipid Profile: No results for input(s): CHOL, HDL, LDLCALC, TRIG, CHOLHDL, LDLDIRECT in the last 72 hours. Thyroid Function Tests: No results for input(s): TSH, T4TOTAL, FREET4, T3FREE, THYROIDAB in the last 72 hours. Anemia Panel: No results for input(s): VITAMINB12, FOLATE, FERRITIN, TIBC, IRON, RETICCTPCT in the last 72 hours. Urine analysis:    Component Value Date/Time   COLORURINE AMBER (A) 06/09/2021 2305   APPEARANCEUR CLOUDY (A) 06/09/2021 2305   LABSPEC 1.013 06/09/2021 2305   PHURINE 5.0 06/09/2021 2305   GLUCOSEU NEGATIVE 06/09/2021 2305   GLUCOSEU NEGATIVE 10/19/2019 1540   HGBUR NEGATIVE 06/09/2021 2305   BILIRUBINUR NEGATIVE 06/09/2021 2305   BILIRUBINUR negative 03/09/2020 [place] 06/09/2021 2305   PROTEINUR 30 (A) 06/09/2021 2305   UROBILINOGEN negative (A) 03/09/2020 1138   UROBILINOGEN 0.2 10/19/2019 1540  NITRITE POSITIVE (A) 06/09/2021 2305   LEUKOCYTESUR LARGE (A)  06/09/2021 2305    Radiological Exams on Admission: DG Chest 1 View  Result Date: 06/09/2021 CLINICAL DATA:  SOB (shortness of breath) R06.02 (ICD-10-CM) EXAM: CHEST  1 VIEW COMPARISON:  10/30/2017. FINDINGS: Similar enlargement the cardiac silhouette. Calcific atherosclerosis of the aorta. Similar chronically prominent right paratracheal stripe, likely vascular. Similar left subclavian approach dual lead cardiac rhythm maintenance device. Similar chronic prominence of the interstitial markings. Similar linear/streaky opacity at the left lung base, likely atelectasis/scar. No consolidation. No visible pleural effusions or pneumothorax. Right axillary clips. IMPRESSION: Chronic changes without evidence of acute cardiopulmonary disease. Electronically Signed   By: Margaretha Sheffield MD   On: 06/09/2021 17:09    EKG: Independently reviewed.  Rate controlled A. fib, nonspecific T wave abnormalities.  Assessment/Plan Principal Problem:   Acute hypoxemic respiratory failure (HCC) Active Problems:   Diabetes ([place])   Hypertension   UTI (urinary tract infection)   Acute-on-chronic kidney injury ([place])   Acute hypoxemic respiratory failure Per EMS report, patient had a positive home COVID test yesterday.  She is unvaccinated.  Satting 90% on room air, sats improved with 2 L supplemental oxygen.  No tachypnea or signs of respiratory distress.  No fever. Chest x-ray showing chronic changes without evidence of acute cardiopulmonary disease.  PE less likely as she is chronically anticoagulated for A. fib. -SARS-CoV-2 PCR test done in the ED and results pending.  Continue airborne and contact precautions.  Check ferritin, fibrinogen, D-dimer, CRP, and LDH levels.  CT chest without contrast ordered for further evaluation.  Avoiding IV contrast in the setting of acute kidney injury.  If COVID test comes back positive, start remdesivir and Decadron.  Continuous pulse ox.  Continue supplemental oxygen.  UTI UA  with positive nitrite, large amount of leukocytes, 21-50 WBCs, and many bacteria.  No fever or leukocytosis.  Patient was given ceftriaxone in the ED.  Previous urine culture growing raoultella planticola sensitive to ceftriaxone. -Continue ceftriaxone.  Add on urine culture.  AKI on CKD stage IIIb Likely prerenal from dehydration.  Has dry mucous membranes.  BUN 37, creatinine 1.6 (baseline 1.1). -Gentle IV fluid hydration in the setting of chronic CHF.  Avoid nephrotoxic agents/contrast.  Hold home Lasix and lisinopril.  Repeat BMP in a.m.  Acute metabolic encephalopathy Likely multifactorial from UTI, AKI, and hypoxemia.  Patient appears confused.  Unclear whether she has baseline dementia.  No gross focal neurodeficit. -Stat head CT ordered as she is on chronic anticoagulation.  Continue management of problems listed above.  Physical deconditioning -PT/OT eval, fall precautions  CAD Not endorsing any anginal symptoms. -Troponin pending.  Continue Coreg.  Chronic combined CHF EF 30 to 35% on echo done in December 2018.  No signs of volume overload. -Hold Lasix given AKI.  Monitor volume status closely.  Permanent A. Fib Currently rate controlled. -Continue Coreg.  Hold Eliquis until head CT is done.  If negative for intracranial bleed, resume Eliquis.  Sick sinus syndrome -Status post PPM  Diet controlled type 2 diabetes with neuropathy A1c 6.2 on 10/09/2020. -Repeat A1c.  Order very sensitive sliding scale insulin if she is started on steroids.  Continue gabapentin.  Hypertension Stable. -Continue Coreg  DVT prophylaxis: Hold Eliquis at this time.  If head CT negative for intracranial bleed, then resume Eliquis. Code Status: Full code Family Communication: No family available at this time. Disposition Plan: Status is: Observation  The patient remains OBS appropriate and will  d/c before 2 midnights.  Dispo: The patient is from: Home              Anticipated d/c is to:  SNF              Patient currently is not medically stable to d/c.   Difficult to place patient No  Level of care: Level of care: Telemetry Medical  The medical decision making on this patient was of high complexity and the patient is at high risk for clinical deterioration, therefore this is a level 3 visit.  Shela Leff MD [facility]  If 7PM-7AM, please contact night-coverage [email or web address]  06/10/2021, 1:27 AM

## 2021-06-10 NOTE — Evaluation (Signed)
Physical Therapy Evaluation Patient Details Name: Jessica [name] MRN: [id number] DOB: [birth year] Today's Date: 06/10/2021   History of Present Illness  87 yo female with onset of AMS and a positive covid test was brought to ED due to being more dependent and unable to be cared for at home.  Pt has UTI, on O2 now, has atelectasis and AKI, pleural effusion.  PMHx:  atherosclerosis, CKD 3b, a-fib, SSS, pacemaker, DM, periph neuropathy, HTN, CAD, CHF  Clinical Impression  Pt was assisted to move on the bed, first to roll and then to sit up.  Her time sitting was limited by the onset of PVC's, and assisted her back to bed.  Pt is requiring help to remain sitting, as she will not hold herself consistently sitting and is distracted by her lines.  Pt is from home with a good PLOF, and recommending rehab to get her strength back and recover her independence to return to stay with daughter. Follow for acute PT goals.    Follow Up Recommendations SNF    Equipment Recommendations  None recommended by PT    Recommendations for Other Services       Precautions / Restrictions Precautions Precautions: Fall Precaution Comments: confusion, PVC's Restrictions Weight Bearing Restrictions: No      Mobility  Bed Mobility Overal bed mobility: Needs Assistance Bed Mobility: Supine to Sit;Sit to Supine     Supine to sit: Mod assist Sit to supine: Mod assist   General bed mobility comments: pt is mildly anxious and resistant to move, but agreed to try.  Requires help to position square on side of bed and to increase her upright postural control, min assist to maintain sit control    Transfers                 General transfer comment: unsafe to attempt, Has PVC's  Ambulation/Gait             General Gait Details: unsafe, PVC's  Stairs            Wheelchair Mobility    Modified Rankin (Stroke Patients Only)       Balance Overall balance assessment: Needs  assistance Sitting-balance support: Bilateral upper extremity supported Sitting balance-Leahy Scale: Poor                                       Pertinent Vitals/Pain Pain Assessment: No/denies pain    Home Living Family/patient expects to be discharged to:: Skilled nursing facility                 Additional Comments: daughter is sick as well and cannot care for her    Prior Function Level of Independence: Independent with assistive device(s)               Hand Dominance   Dominant Hand: Right    Extremity/Trunk Assessment   Upper Extremity Assessment Upper Extremity Assessment: Generalized weakness    Lower Extremity Assessment Lower Extremity Assessment: Generalized weakness    Cervical / Trunk Assessment Cervical / Trunk Assessment: Kyphotic  Communication   Communication: No difficulties  Cognition Arousal/Alertness: Lethargic Behavior During Therapy: Flat affect;Anxious Overall Cognitive Status: No family/caregiver present to determine baseline cognitive functioning  General Comments: unclear if she is somewhat confused at baseline      General Comments General comments (skin integrity, edema, etc.): pt is unable to be stood up due to PVC's occurring on her telemetry readout.  Pt is confused, has motor delay to respond to vc's and is not safe to stand with the  combination of PVC's and confusion.    Exercises     Assessment/Plan    PT Assessment Patient needs continued PT services  PT Problem List Cardiopulmonary status limiting activity;Decreased strength;Decreased balance;Decreased cognition;Decreased safety awareness       PT Treatment Interventions DME instruction;Gait training;Functional mobility training;Therapeutic activities;Therapeutic exercise;Neuromuscular re-education;Balance training;Patient/family education    PT Goals (Current goals can be found in the Care Plan  section)  Acute Rehab PT Goals Patient Stated Goal: none stated PT Goal Formulation: Patient unable to participate in goal setting Time For Goal Achievement: 06/24/21 Potential to Achieve Goals: Fair    Frequency Min 3X/week   Barriers to discharge   pt is unable to give history of home with daughter    Co-evaluation               AM-PAC PT "6 Clicks" Mobility  Outcome Measure Help needed turning from your back to your side while in a flat bed without using bedrails?: A Lot Help needed moving from lying on your back to sitting on the side of a flat bed without using bedrails?: A Lot Help needed moving to and from a bed to a chair (including a wheelchair)?: A Lot Help needed standing up from a chair using your arms (e.g., wheelchair or bedside chair)?: Total Help needed to walk in hospital room?: Total Help needed climbing 3-5 steps with a railing? : Total 6 Click Score: 9    End of Session Equipment Utilized During Treatment: Oxygen Activity Tolerance: Patient limited by fatigue;Treatment limited secondary to medical complications (Comment) Patient left: in bed;with call bell/phone within reach Nurse Communication: Mobility status PT Visit Diagnosis: Muscle weakness (generalized) (M62.81);Other abnormalities of gait and mobility (R26.89)    Time: [id number] PT Time Calculation (min) (ACUTE ONLY): 26 min   Charges:   PT Evaluation $PT Eval Moderate Complexity: 1 Mod PT Treatments $Therapeutic Activity: 8-22 mins       Ramond Dial 06/10/2021, 10:15 AM  Mee Hives, PT MS Acute Rehab Dept. Number: [place] and [place]

## 2021-06-10 NOTE — ED Notes (Signed)
Placed Breakfast Order 

## 2021-06-10 NOTE — ED Notes (Signed)
This RN fed pt 5 bites of [place] and potatoes. Pt drank a cup of milk. Pt resting comfortably at this time.

## 2021-06-10 NOTE — Progress Notes (Signed)
Inpatient Diabetes Program Recommendations  AACE/ADA: New Consensus Statement on Inpatient Glycemic Control (2015)  Target Ranges:  Prepandial:   less than 140 mg/dL      Peak postprandial:   less than 180 mg/dL (1-2 hours)      Critically ill patients:  140 - 180 mg/dL   Lab Results  Component Value Date   GLUCAP 272 (H) 06/10/2021   HGBA1C 6.8 (H) 06/10/2021    Review of Glycemic Control Results for Jessica [name], Jessica [name] (MRN [id number]) as of 06/10/2021 14:58  Ref. Range 06/10/2021 07:14 06/10/2021 11:44  Glucose-Capillary Latest Ref Range: 70 - 99 mg/dL 119 (H) 272 (H)   COVID + Diabetes history: DM 2 Outpatient Diabetes medications: Diet controlled Current orders for Inpatient glycemic control:  Novolog 0-6 units tid + hs  Decadron 6 mg Q24 hours A1c 6.8% on 8/8  Inpatient Diabetes Program Recommendations:    -  add Tradjenta 5 mg Daily  Novolog Correction just started if glucose trends continue to increase may need to add meal coverage and possibly basal insulin.  Thanks,  Tama Headings RN, MSN, BC-ADM Inpatient Diabetes Coordinator Team Pager [phone] (8a-5p)

## 2021-06-10 NOTE — ED Notes (Signed)
Report attempted.  Bed is pending staff that arrives at 1900.  Will call report after shift change.

## 2021-06-10 NOTE — ED Notes (Signed)
Pt is a hard stick. Consulted IV team.

## 2021-06-10 NOTE — ED Notes (Signed)
Attempted report as oncoming RN 1x

## 2021-06-10 NOTE — Evaluation (Signed)
Occupational Therapy Evaluation Patient Details Name: Jessica [name] MRN: [id number] DOB: 1927/02/17 Today's Date: 06/10/2021    History of Present Illness 87 yo female with onset of AMS and a positive covid test was brought to ED due to being more dependent and unable to be cared for at home.  Pt has UTI, on O2 now, has atelectasis and AKI, pleural effusion.  PMHx:  atherosclerosis, CKD 3b, a-fib, SSS, pacemaker, DM, periph neuropathy, HTN, CAD, CHF   Clinical Impression   This 87 yo female admitted with above presents to acute OT with min A for bed mobility and setup/total A for basic ADLs at bed/sitting EOB level. PTA pt reports she had quite a bit of A from her dtr for basic ADLs (pt is confused so not sure if totally accurate); appears pt can do more. We will continue to follow for acute OT with follow up recommended at SNF.    Follow Up Recommendations  SNF;Supervision/Assistance - 24 hour    Equipment Recommendations  Other (comment) (TBD next venue)       Precautions / Restrictions Precautions Precautions: Fall Precaution Comments: confusion Restrictions Weight Bearing Restrictions: No      Mobility Bed Mobility Overal bed mobility: Needs Assistance Bed Mobility: Rolling;Supine to Sit;Sit to Supine Rolling: Min assist (A to get all the way over on her side')   Supine to sit: Min assist;HOB elevated (A for trunk) Sit to supine: Min assist (A for legs)                  Balance Overall balance assessment: Needs assistance Sitting-balance support: No upper extremity supported;Feet unsupported Sitting balance-Leahy Scale: Fair                                     ADL either performed or assessed with clinical judgement   ADL Overall ADL's : Needs assistance/impaired Eating/Feeding: Set up;Bed level   Grooming: Wash/dry face;Set up;Sitting Grooming Details (indicate cue type and reason): Edge of stretcher Upper Body Bathing: Minimal  assistance;Sitting Upper Body Bathing Details (indicate cue type and reason): Edge of stretcher Lower Body Bathing: Total assistance;Bed level   Upper Body Dressing : Minimal assistance;Sitting Upper Body Dressing Details (indicate cue type and reason): Edge of stretcher Lower Body Dressing: Total assistance;Bed level                       Vision Patient Visual Report: No change from baseline              Pertinent Vitals/Pain Pain Assessment: No/denies pain     Hand Dominance Right   Extremity/Trunk Assessment Upper Extremity Assessment Upper Extremity Assessment: Generalized weakness      Communication Communication Communication: HOH   Cognition Arousal/Alertness: Awake/alert Behavior During Therapy: WFL for tasks assessed/performed Overall Cognitive Status: No family/caregiver present to determine baseline cognitive functioning.  Area of Impairment: Following commands;Safety/judgement;Problem solving                       Following Commands: Follows one step commands consistently Safety/Judgement: Decreased awareness of safety   Problem Solving: Requires verbal cues;Requires tactile cues General Comments: HOH also              Home Living Family/patient expects to be discharged to:: Skilled nursing facility  Additional Comments: daughter is sick as well with COVID and cannot care for her      Prior Functioning/Environment Level of Independence: Needs assistance    ADL's / Homemaking Assistance Needed: Per pt report (she is confused) she says dtr helps her will most bathing and dressing and toileting            OT Problem List: Impaired balance (sitting and/or standing);Decreased cognition      OT Treatment/Interventions: Self-care/ADL training;DME and/or AE instruction;Patient/family education;Balance training    OT Goals(Current goals can be found in the care plan section) Acute  Rehab OT Goals Patient Stated Goal: to get Depends changed OT Goal Formulation: With patient Time For Goal Achievement: 06/24/21 Potential to Achieve Goals: Good  OT Frequency: Min 2X/week    AM-PAC OT "6 Clicks" Daily Activity     Outcome Measure Help from another person eating meals?: A Little Help from another person taking care of personal grooming?: A Little Help from another person toileting, which includes using toliet, bedpan, or urinal?: A Lot Help from another person bathing (including washing, rinsing, drying)?: A Lot Help from another person to put on and taking off regular upper body clothing?: A Lot Help from another person to put on and taking off regular lower body clothing?: Total 6 Click Score: 13   End of Session Nurse Communication: Mobility status (changed Depends)  Activity Tolerance: Patient tolerated treatment well Patient left: in bed;with call bell/phone within reach (both side rails up on stretcher)  OT Visit Diagnosis: Other abnormalities of gait and mobility (R26.89);Muscle weakness (generalized) (M62.81);Other symptoms and signs involving cognitive function                Time: [id number] OT Time Calculation (min): 23 min Charges:  OT General Charges $OT Visit: 1 Visit OT Evaluation $OT Eval Moderate Complexity: 1 Mod OT Treatments $Self Care/Home Management : 8-22 mins  [place], OTR/L Acute [employer] Pager [phone] Office [phone]    Almon Register 06/10/2021, 12:48 PM

## 2021-06-10 NOTE — ED Notes (Signed)
Report given to floor RN

## 2021-06-11 LAB — CBC
HCT: 46.9 % — ABNORMAL HIGH (ref 36.0–46.0)
Hemoglobin: 15.2 g/dL — ABNORMAL HIGH (ref 12.0–15.0)
MCH: 30.8 pg (ref 26.0–34.0)
MCHC: 32.4 g/dL (ref 30.0–36.0)
MCV: 95.1 fL (ref 80.0–100.0)
Platelets: 145 10*3/uL — ABNORMAL LOW (ref 150–400)
RBC: 4.93 MIL/uL (ref 3.87–5.11)
RDW: 14.3 % (ref 11.5–15.5)
WBC: 6.4 10*3/uL (ref 4.0–10.5)
nRBC: 0 % (ref 0.0–0.2)

## 2021-06-11 LAB — COMPREHENSIVE METABOLIC PANEL
ALT: 23 U/L (ref 0–44)
AST: 34 U/L (ref 15–41)
Albumin: 2.9 g/dL — ABNORMAL LOW (ref 3.5–5.0)
Alkaline Phosphatase: 100 U/L (ref 38–126)
Anion gap: 13 (ref 5–15)
BUN: 39 mg/dL — ABNORMAL HIGH (ref 8–23)
CO2: 18 mmol/L — ABNORMAL LOW (ref 22–32)
Calcium: 9.7 mg/dL (ref 8.9–10.3)
Chloride: 106 mmol/L (ref 98–111)
Creatinine, Ser: 1.09 mg/dL — ABNORMAL HIGH (ref 0.44–1.00)
GFR, Estimated: 48 mL/min — ABNORMAL LOW (ref >60)
Glucose, Bld: 156 mg/dL — ABNORMAL HIGH (ref 70–99)
Potassium: 4.8 mmol/L (ref 3.5–5.1)
Sodium: 137 mmol/L (ref 135–145)
Total Bilirubin: 1.1 mg/dL (ref 0.3–1.2)
Total Protein: 6.4 g/dL — ABNORMAL LOW (ref 6.5–8.1)

## 2021-06-11 LAB — C-REACTIVE PROTEIN: CRP: 3 mg/dL — ABNORMAL HIGH (ref <1.0)

## 2021-06-11 LAB — GLUCOSE, CAPILLARY
Glucose-Capillary: 150 mg/dL — ABNORMAL HIGH (ref 70–99)
Glucose-Capillary: 154 mg/dL — ABNORMAL HIGH (ref 70–99)
Glucose-Capillary: 233 mg/dL — ABNORMAL HIGH (ref 70–99)
Glucose-Capillary: 234 mg/dL — ABNORMAL HIGH (ref 70–99)
Glucose-Capillary: 259 mg/dL — ABNORMAL HIGH (ref 70–99)

## 2021-06-11 LAB — D-DIMER, QUANTITATIVE: D-Dimer, Quant: >20 ug/mL-FEU — ABNORMAL HIGH (ref 0.00–0.50)

## 2021-06-11 LAB — FERRITIN: Ferritin: 286 ng/mL (ref 11–307)

## 2021-06-11 MED ORDER — METHYLPREDNISOLONE SODIUM SUCC 125 MG IJ SOLR
60.0000 mg | Freq: Two times a day (BID) | INTRAMUSCULAR | Status: DC
  Administered 2021-06-11 – 2021-06-14 (×7): 60 mg via INTRAVENOUS
  Filled 2021-06-11 (×8): qty 2

## 2021-06-11 NOTE — Progress Notes (Signed)
Pt was admitted from ED where she tested to be Covid-19 positive, with pneumonia and UTI. Pt pleasant, cooperative, and confused to place and situation. O2 sats 93-98% on 2 LPM/Jessica [name]. Pt high fall risk. She reports " I don't know what happened I just wake up one morning and I couldn't walk on my feet. Pt admits to discomfort pain in left ankle and toes on palpation exam, no pain at rest. Attempts to reorient pt and make her feel safe and comfortable. NO respiratory struggle or distress. Daughters number obtained from ED, Jessica [name]. [phone].

## 2021-06-11 NOTE — Progress Notes (Signed)
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  Reports positive home COVID test yesterday.  Unvaccinated.  Patient is confused, not sure why she is in the hospital.  Patient complaining of cough and urinary frequency.  On EMS arrival, patient was noted to be oxygenating 90% on room air, placed on 2 L nasal cannula with improvement to 96%.  In the ED, temperature 98.8 F, HR 66, RR 25, BP 124/61, SPO2 94% on 2 L nasal cannula at rest.  Sodium 139, potassium 4.5, chloride 107, CO2 26, glucose 163, BUN 37, creatinine 1.61.  WBC 5.6, hemoglobin 13.5, platelets 192.  COVID-19 PCR positive.  Influenza A/B PCR negative.  Urinalysis with large leukocytes, positive nitrite, many bacteria, 21-50 WBCs. CT head without contrast with chronic atrophic and ischemic changes without acute abnormality.  CT chest without contrast with small right-sided pleural effusion, bibasilar atelectatic changes, air trapping no focal consolidation.  Scattered hypodensities throughout the thyroid gland.   Assessment & Plan:   Principal Problem:   Acute hypoxemic respiratory failure (HCC) Active Problems:   Diabetes ([place])   Hypertension   UTI (urinary tract infection)   Acute-on-chronic kidney injury ([place])   Acute respiratory failure with hypoxia (HCC)   Acute hypoxic respiratory failure secondary to acute Covid-19 viral pneumonia during the ongoing Covid 19 Pandemic - POA Patient presenting from home with  generalized weakness, malaise, fatigue and shortness of breath.  Found to be hypoxic with SPO2 90% on room air.  No history of lung disease.  Reported home COVID test positive. --COVID test: 06/09/2021 --CRP 2.5>3.0 --ddimer >20, >20 --Remdesivir, plan 5-day course (Day #2/5) --Change dexamethasone to Solu-Medrol '60mg'$  IV q12h --Continue supplemental oxygen, titrate to maintain SPO2 greater than 92%, on 2 L nasal cannula with SPO2 93% at rest --Continue supportive care with albuterol MDI prn, vitamin C, zinc, Tylenol, antitussives  --Incentive spirometry/flutter valve --Follow CBC, CMP, D-dimer, ferritin, and CRP daily --Continue airborne/contact isolation   The treatment plan and use of medications and known side effects were discussed with patient/family. Some of the medications used are based on case reports/anecdotal data.  All other medications being used in the management of COVID-19 based on limited study data.  Complete risks and long-term side effects are unknown, however in the best clinical judgment they seem to be of some benefit.  Patient wanted to proceed with treatment options provided.  Acute renal failure on CKD stage IIIb: Resolved Patient presenting with creatinine 1.61.  Baseline creatinine 1.13.  Etiology likely secondary to prerenal azotemia in the setting of poor oral intake. --Cr 1.61>1.46>1.09 --Hold home ACE inhibitor and Lasix --Avoid nephrotoxins, renally dose all medications --Repeat BMP in the a.m.  Klebsiella ornithinolytica Urinary tract infection Patient reported urinary frequency.  Urinalysis with large leukocytes, positive nitrite, many bacteria and 21-50 WBCs consistent with UTI.  History of previous E. coli, Enterobacter, Raoultella UTI --Urine culture: >100K Klebsiella ornithinolytica --Ceftriaxone 1 g IV every 24 hours  CAD s/p PCI Underwent PCI to mid and distal RCA 2010.  Not on  aspirin given need for anticoagulation.  Previously on pravastatin but was  discontinued due to memory issues.  On omega-3 fatty acids at home. --Continue carvedilol 25 mg p.o. twice daily --Hold lisinopril --On Eliquis  Chronic combined systolic/diastolic congestive heart failure, compensated TTE 11/01/2017 with LVEF 30-35%, moderate diffuse hypokinesis, mild MR.  Follows with cardiology outpatient, Dr. Johney Frame. --Holding lisinopril and furosemide due to AKI as above --Continue carvedilol 25 mg p.o. twice daily --Strict I's and O's and daily weights  Permanent atrial fibrillation on anticoagulation SSS s/p PPM Follows with electrophysiology, Dr. [name] outpatient.  --carvedilol 25 mg p.o. twice daily --Eliquis 5 mg p.o. twice daily --Continue to monitor on telemetry  Type 2 diabetes mellitus Hemoglobin A1c 6.8, well controlled.  Diet controlled at home. --SSI for coverage --CBG qAC/HS  Peripheral neuropathy --Gabapentin 300 mg p.o. twice daily, 40 mg p.o. nightly  Essential hypertension --Carvedilol 25 mg p.o. twice daily --Holding lisinopril and furosemide as above for AKI  Hyperlipidemia: Previous on pravastatin, DC'd for memory issues.  On omega-3 fatty acids outpatient.  Thyroid hypodensities Incidental finding of thyroid hypodensities on CT chest.  Given age, no follow-up required.  Weakness/deconditioning: --PT/OT with recommendation of SNF placement, TOC for placement   DVT prophylaxis:  apixaban (ELIQUIS) tablet 5 mg    Code Status: Full Code Family Communication: Attempted to update patient's daughter, [name] via telephone, unsuccessful with no answer today.  Disposition Plan:  Level of care: Telemetry Medical Status is: Inpatient  Remains inpatient appropriate because:Altered mental status, Ongoing diagnostic testing needed not appropriate for outpatient work up, Unsafe d/c plan, IV treatments appropriate due to intensity of illness or inability to take PO, and Inpatient level of care appropriate due to severity of  illness  Dispo: The patient is from: Home              Anticipated d/c is to: SNF              Patient currently is not medically stable to d/c.   Difficult to place patient No  Consultants:  None  Procedures:  None  Antimicrobials:  Remdesivir   Subjective: Patient seen examined bedside, resting comfortably.  Remains pleasantly confused.  Continues with mild shortness of breath, requiring supplemental oxygen.  No family present.  No other specific complaints or concerns at this time.  Denies headache, no chest pain, no palpitations, no abdominal pain.  No acute concerns at this point per nursing staff.  Objective: Vitals:   06/10/21 2341 06/11/21 0347 06/11/21 0813 06/11/21 1202  BP: (!) 163/77 (!) 151/74 (!) 179/102 (!) 137/57  Pulse: 74 100 84 67  Resp: '20 18 17 20  '$ Temp: 98 F (36.7 C) 97.8 F (36.6 C) 97.8 F (36.6 C) 97.6 F (36.4 C)  TempSrc: Oral Axillary Axillary Oral  SpO2: 98% 93% 92% 99%  Weight:      Height:        Intake/Output Summary (Last 24 hours) at 06/11/2021 1341 Last data filed at 06/11/2021 1204 Gross per 24 hour  Intake 490 ml  Output 350 ml  Net 140 ml   Filed Weights   06/10/21 0013  Weight: 70.3 kg    Examination:  General exam: Appears calm and comfortable, elderly and chronically ill in appearance, pleasantly confused Respiratory system: Decreased breath sounds bilateral bases, otherwise clear to auscultation without wheezing/crackles, normal respiratory effort on 2 L nasal cannula with SPO2 93% at rest Cardiovascular system: S1 & S2 heard, RRR. No JVD, murmurs, rubs, gallops  or clicks. No pedal edema. Gastrointestinal system: Abdomen is nondistended, soft and nontender. No organomegaly or masses felt. Normal bowel sounds heard. Central nervous system: Alert. No focal neurological deficits. Extremities: Moves all extremities independently, no peripheral edema Skin: No rashes, lesions or ulcers Psychiatry: Judgement and insight appear  poor. Mood & affect appropriate.     Data Reviewed: I have personally reviewed following labs and imaging studies  CBC: Recent Labs  Lab 06/09/21 1649 06/11/21 0630  WBC 5.6 6.4  NEUTROABS 3.5  --   HGB 13.5 15.2*  HCT 42.1 46.9*  MCV 93.6 95.1  PLT 192 [id number]*   Basic Metabolic Panel: Recent Labs  Lab 06/09/21 1649 06/10/21 0415 06/11/21 0041  NA 139 138 137  K 4.5 4.7 4.8  CL 107 107 106  CO2 26 22 18*  GLUCOSE 163* 118* 156*  BUN 37* 40* 39*  CREATININE 1.61* 1.46* 1.09*  CALCIUM 9.8 9.5 9.7   GFR: Estimated Creatinine Clearance: 31.7 mL/min (A) (by C-G formula based on SCr of 1.09 mg/dL (H)). Liver Function Tests: Recent Labs  Lab 06/10/21 0415 06/11/21 0041  AST 32 34  ALT 23 23  ALKPHOS 84 100  BILITOT 0.8 1.1  PROT 6.2* 6.4*  ALBUMIN 3.0* 2.9*   No results for input(s): LIPASE, AMYLASE in the last 168 hours. No results for input(s): AMMONIA in the last 168 hours. Coagulation Profile: No results for input(s): INR, PROTIME in the last 168 hours. Cardiac Enzymes: No results for input(s): CKTOTAL, CKMB, CKMBINDEX, TROPONINI in the last 168 hours. BNP (last 3 results) Recent Labs    09/03/20 1637  PROBNP 4,231*   HbA1C: Recent Labs    06/10/21 0412  HGBA1C 6.8*   CBG: Recent Labs  Lab 06/10/21 1144 06/10/21 1746 06/11/21 0025 06/11/21 0812 06/11/21 1201  GLUCAP 272* 180* 154* 150* 233*   Lipid Profile: No results for input(s): CHOL, HDL, LDLCALC, TRIG, CHOLHDL, LDLDIRECT in the last 72 hours. Thyroid Function Tests: No results for input(s): TSH, T4TOTAL, FREET4, T3FREE, THYROIDAB in the last 72 hours. Anemia Panel: Recent Labs    06/10/21 0413 06/11/21 0041  FERRITIN 176 286   Sepsis Labs: No results for input(s): PROCALCITON, LATICACIDVEN in the last 168 hours.  Recent Results (from the past 240 hour(s))  Urine Culture     Status: Abnormal (Preliminary result)   Collection Time: 06/09/21 11:05 PM   Specimen: Urine, Clean  Catch  Result Value Ref Range Status   Specimen Description URINE, CLEAN CATCH  Final   Special Requests NONE  Final   Culture (A)  Final    >=100,000 COLONIES/mL KLEBSIELLA ORNITHINOLYTICA SUSCEPTIBILITIES TO FOLLOW Performed at [facility], [address], [place], [place] [address]    Report Status PENDING  Incomplete  Resp Panel by RT-PCR (Flu A&B, Covid) Nasopharyngeal Swab     Status: Abnormal   Collection Time: 06/10/21 12:12 AM   Specimen: Nasopharyngeal Swab; Nasopharyngeal(NP) swabs in vial transport medium  Result Value Ref Range Status   SARS Coronavirus 2 by RT PCR POSITIVE (A) NEGATIVE Final    Comment: RESULT CALLED TO, READ BACK BY AND VERIFIED WITH: S ROWLAND,RN'@0123'$  06/10/21 MKELLY (NOTE) SARS-CoV-2 target nucleic acids are DETECTED.  The SARS-CoV-2 RNA is generally detectable in upper respiratory specimens during the acute phase of infection. Positive results are indicative of the presence of the identified virus, but do not rule out bacterial infection or co-infection with other pathogens not detected by the test. Clinical correlation with patient  history and other diagnostic information is necessary to determine patient infection status. The expected result is Negative.  Fact Sheet for Patients: EntrepreneurPulse.com.au  Fact Sheet for Healthcare Providers: IncredibleEmployment.be  This test is not yet approved or cleared by the [place] FDA and  has been authorized for detection and/or diagnosis of SARS-CoV-2 by FDA under an Emergency Use Authorization (EUA).  This EUA will remain in effect (meaning this test can be  used) for the duration of  the COVID-19 declaration under Section 564(b)(1) of the Act, 21 U.S.C. section 360bbb-3(b)(1), unless the authorization is terminated or revoked sooner.     Influenza A by PCR NEGATIVE NEGATIVE Final   Influenza B by PCR NEGATIVE NEGATIVE Final    Comment:  (NOTE) The Xpert Xpress SARS-CoV-2/FLU/RSV plus assay is intended as an aid in the diagnosis of influenza from Nasopharyngeal swab specimens and should not be used as a sole basis for treatment. Nasal washings and aspirates are unacceptable for Xpert Xpress SARS-CoV-2/FLU/RSV testing.  Fact Sheet for Patients: EntrepreneurPulse.com.au  Fact Sheet for Healthcare Providers: IncredibleEmployment.be  This test is not yet approved or cleared by the [place] FDA and has been authorized for detection and/or diagnosis of SARS-CoV-2 by FDA under an Emergency Use Authorization (EUA). This EUA will remain in effect (meaning this test can be used) for the duration of the COVID-19 declaration under Section 564(b)(1) of the Act, 21 U.S.C. section 360bbb-3(b)(1), unless the authorization is terminated or revoked.  Performed at [facility], [place] [address], [place], [place] [address]          Radiology Studies: DG Chest 1 View  Result Date: 06/09/2021 CLINICAL DATA:  SOB (shortness of breath) R06.02 (ICD-10-CM) EXAM: CHEST  1 VIEW COMPARISON:  10/30/2017. FINDINGS: Similar enlargement the cardiac silhouette. Calcific atherosclerosis of the aorta. Similar chronically prominent right paratracheal stripe, likely vascular. Similar left subclavian approach dual lead cardiac rhythm maintenance device. Similar chronic prominence of the interstitial markings. Similar linear/streaky opacity at the left lung base, likely atelectasis/scar. No consolidation. No visible pleural effusions or pneumothorax. Right axillary clips. IMPRESSION: Chronic changes without evidence of acute cardiopulmonary disease. Electronically Signed   By: Margaretha Sheffield MD   On: 06/09/2021 17:09   CT HEAD WO CONTRAST (5MM)  Result Date: 06/10/2021 CLINICAL DATA:  Altered mental status, history of COVID-19 positivity EXAM: CT HEAD WITHOUT CONTRAST TECHNIQUE: Contiguous axial images were  obtained from the base of the skull through the vertex without intravenous contrast. COMPARISON:  12/23/2014 FINDINGS: Brain: No evidence of acute infarction, hemorrhage, hydrocephalus, extra-axial collection or mass lesion/mass effect. Generalized atrophic changes are noted. Mild chronic white matter ischemic changes are seen as well. Vascular: No hyperdense vessel or unexpected calcification. Skull: Normal. Negative for fracture or focal lesion. Sinuses/Orbits: No acute finding. Other: None. IMPRESSION: Chronic atrophic and ischemic changes without acute abnormality. Electronically Signed   By: Inez Catalina M.D.   On: 06/10/2021 02:34   CT CHEST WO CONTRAST  Result Date: 06/10/2021 CLINICAL DATA:  Respiratory failure, history of COVID-19 infection EXAM: CT CHEST WITHOUT CONTRAST TECHNIQUE: Multidetector CT imaging of the chest was performed following the standard protocol without IV contrast. COMPARISON:  Chest x-ray from the previous day. FINDINGS: Cardiovascular: Limited due to the lack of IV contrast. Atherosclerotic calcifications are noted. No aneurysmal dilatation is seen. Cardiac enlargement is noted. Pacing device is again seen. Coronary calcifications are noted. No enlargement of the pulmonary artery is seen. Mediastinum/Nodes: Thoracic inlet is within normal limits with  the exception of scattered hypodensities throughout the thyroid. No discrete nodule is seen. No significant enlargement is noted. No sizable hilar or mediastinal adenopathy is noted. The esophagus is within normal limits. Lungs/Pleura: Small right-sided pleural effusion is noted. Mild bibasilar atelectatic changes are seen. No focal confluent infiltrate is noted. Mosaic attenuation is noted consistent with air trapping. No significant edema is noted. No sizable parenchymal nodule is seen. Upper Abdomen: Visualized upper abdomen show cystic lesions within the spleen similar to that noted on prior CT and ultrasound examination consistent  with cysts. Musculoskeletal: Degenerative changes of the thoracic spine are noted. No acute rib abnormality is noted. IMPRESSION: Small right-sided pleural effusion. Bibasilar atelectatic changes as well as changes of air trapping. No focal confluent infiltrate is seen. Scattered hypodensities are noted throughout the thyroid. Due to the patient's age, no follow-up recommended unless clinically warranted (ref: J Am Coll Radiol. 2015 Feb;12(2): 143-50). Aortic Atherosclerosis (ICD10-I70.0). Electronically Signed   By: Inez Catalina M.D.   On: 06/10/2021 02:42        Scheduled Meds:  apixaban  5 mg Oral BID   carvedilol  25 mg Oral BID WC   gabapentin  300 mg Oral BID   gabapentin  400 mg Oral QHS   insulin aspart  0-5 Units Subcutaneous QHS   insulin aspart  0-6 Units Subcutaneous TID WC   linagliptin  5 mg Oral Daily   methylPREDNISolone (SOLU-MEDROL) injection  60 mg Intravenous Q12H   Continuous Infusions:  cefTRIAXone (ROCEPHIN)  IV 1 g (06/11/21 0019)   remdesivir 100 mg in NS 100 mL 100 mg (06/11/21 0924)     LOS: 1 day    Time spent: 39 minutes spent on chart review, discussion with nursing staff, consultants, updating family and interview/physical exam; more than 50% of that time was spent in counseling and/or coordination of care.    Kathy Wahid J [place] DO [facility] Available via Epic secure chat 7am-7pm After these hours, please refer to coverage provider listed on [email or web address] 06/11/2021, 1:41 PM

## 2021-06-11 NOTE — TOC Initial Note (Signed)
Transition of Care [facility]) - Initial/Assessment Note    Patient Details  Name: Jessica [name] MRN: [id number] Date of Birth: 21-Jan-1927  Transition [facility]) CM/SW Contact:    Tom-Johnson, Renea Ee, RN Phone Number: 06/11/2021, 12:10 PM  Clinical Narrative:                 Spoke with daughter,Jessica [name] about transitioning patient to SNF. Daughter made aware that Jessica [name] is the only option at this time. Daughter is agreeable but wants patient to stay in the hospital for one more night before discharge. Will continue to follow up for discharge needs.    Expected Discharge Plan: Skilled Nursing Facility Barriers to Discharge: Continued Medical Work up   Patient Goals and CMS Choice     Choice offered to / list presented to : Patient, Adult Children (Daughter)  Expected Discharge Plan and Services Expected Discharge Plan: [place] In-house Referral: Clinical Social Work Discharge Planning Services: CM Consult Post Acute Care Choice: [place] arrangements for the past 2 months: [place] (With daughter)                                      Prior Living Arrangements/Services Living arrangements for the past 2 months: Single Family Home (With daughter) Lives with:: Adult Children (Daughter) Patient language and need for interpreter reviewed:: Yes Do you feel safe going back to the place where you live?: Yes      Need for Family Participation in Patient Care: Yes (Comment) Care giver support system in place?: Yes (comment)   Criminal Activity/Legal Involvement Pertinent to Current Situation/Hospitalization: No - Comment as needed  Activities of Daily Living      Permission Sought/Granted Permission sought to share information with : Case Manager, Customer service manager Permission granted to share information with : Yes, Verbal Permission Granted     Permission granted to share info w AGENCY:  [facility]        Emotional Assessment Appearance:: Appears stated age Attitude/Demeanor/Rapport: Engaged Affect (typically observed): Accepting Orientation: : Oriented to Self Alcohol / Substance Use: Never Used Psych Involvement: No (comment)  Admission diagnosis:  UTI (urinary tract infection) [N39.0] SOB (shortness of breath) [R06.02] Acute respiratory failure with hypoxia ([place]) [J96.01] Patient Active Problem List   Diagnosis Date Noted   Acute hypoxemic respiratory failure ([place]) 06/10/2021   Acute respiratory failure with hypoxia ([place]) 06/10/2021   Abdominal bloating 03/09/2020   Chronic constipation 03/09/2020   H/O bilateral mastectomy 10/13/2018   HX: breast cancer 10/13/2018   Permanent atrial fibrillation ([place]) 10/31/2017   Cardiac pacemaker in situ    Chronic combined systolic and diastolic CHF (congestive heart failure) ([place]) 10/12/2017   UTI (urinary tract infection) 03/28/2017   Acute-on-chronic kidney injury ([place]) 03/28/2017   Bilateral leg edema 04/29/2016   CKD (chronic kidney disease) stage 3, GFR 30-59 ml/min (HCC) 06/10/2015   CAD (coronary artery disease) 06/09/2015   Diabetic neuropathy ([place]) 07/06/2013   SSS (sick sinus syndrome) ([place]) 02/10/2013   Hypertension 07/30/2012   Pacemaker-Medtronic 07/19/2012   Benign hypertensive heart disease with heart failure ([place]) 01/28/2012   Diabetes ([place]) 10/22/2010   HYPERCHOLESTEROLEMIA 10/22/2010   DEGENERATIVE JOINT DISEASE 10/22/2010   PCP:  Binnie Rail, MD Pharmacy:   [facility] ([place]), [place] - [address] [place]) [place] [address] Phone: [phone]  Fax: [phone]     Social Determinants of Health (SDOH) Interventions    Readmission Risk Interventions No flowsheet data found.

## 2021-06-11 NOTE — NC FL2 (Signed)
[place] LEVEL OF CARE SCREENING TOOL     IDENTIFICATION  Patient Name: Jessica [name] Birthdate: August 05, 1927 Sex: female Admission Date (Current Location): 06/09/2021  [facility] and [place] Number:  Herbalist and Address:  [facility], [place] [address] [place], [place] [address]      Provider Number: [id number]  Attending Physician Name and Address:  [place] Donnamarie Poag, DO  Relative Name and Phone Number:       Current Level of Care: Hospital Recommended Level of Care: [place] Prior Approval Number:    Date Approved/Denied:   PASRR Number: [id number]  Discharge Plan: SNF    Current Diagnoses: Patient Active Problem List   Diagnosis Date Noted   Acute hypoxemic respiratory failure ([place]) 06/10/2021   Acute respiratory failure with hypoxia ([place]) 06/10/2021   Abdominal bloating 03/09/2020   Chronic constipation 03/09/2020   H/O bilateral mastectomy 10/13/2018   HX: breast cancer 10/13/2018   Permanent atrial fibrillation ([place]) 10/31/2017   Cardiac pacemaker in situ    Chronic combined systolic and diastolic CHF (congestive heart failure) ([place]) 10/12/2017   UTI (urinary tract infection) 03/28/2017   Acute-on-chronic kidney injury ([place]) 03/28/2017   Bilateral leg edema 04/29/2016   CKD (chronic kidney disease) stage 3, GFR 30-59 ml/min (HCC) 06/10/2015   CAD (coronary artery disease) 06/09/2015   Diabetic neuropathy ([place]) 07/06/2013   SSS (sick sinus syndrome) ([place]) 02/10/2013   Hypertension 07/30/2012   Pacemaker-Medtronic 07/19/2012   Benign hypertensive heart disease with heart failure ([place]) 01/28/2012   Diabetes ([place]) 10/22/2010   HYPERCHOLESTEROLEMIA 10/22/2010   DEGENERATIVE JOINT DISEASE 10/22/2010    Orientation RESPIRATION BLADDER Height & Weight     Self  Normal Incontinent Weight: 70.3 kg Height:  '5\' 4"'$  (162.6 cm)  BEHAVIORAL SYMPTOMS/MOOD NEUROLOGICAL BOWEL NUTRITION STATUS       Incontinent Diet (Heart healthy diet/carbmod diet)  AMBULATORY STATUS COMMUNICATION OF NEEDS Skin   Extensive Assist Verbally Normal                       Personal Care Assistance Level of Assistance  Bathing, Feeding, Dressing, Total care Bathing Assistance: Maximum assistance Feeding assistance: Limited assistance Dressing Assistance: Maximum assistance Total Care Assistance: Maximum assistance   Functional Limitations Info             SPECIAL CARE FACTORS FREQUENCY  PT (By licensed PT), OT (By licensed OT)     PT Frequency: 5x/week OT Frequency: 5x/week            Contractures Contractures Info: Not present    Additional Factors Info  Code Status, Allergies, Insulin Sliding Scale, Isolation Precautions Code Status Info: Full Allergies Info: Amlodipine, Lyrica, Sulfonamide Derivatives   Insulin Sliding Scale Info: Novolog Isolation Precautions Info: Airborne/Contact Precautions     Current Medications (06/11/2021):  This is the current hospital active medication list Current Facility-Administered Medications  Medication Dose Route Frequency Provider Last Rate Last Admin   apixaban (ELIQUIS) tablet 5 mg  5 mg Oral BID Shela Leff, MD   5 mg at 06/11/21 0900   carvedilol (COREG) tablet 25 mg  25 mg Oral BID WC Shela Leff, MD   25 mg at 06/11/21 0904   cefTRIAXone (ROCEPHIN) 1 g in sodium chloride 0.9 % 100 mL IVPB  1 g Intravenous Q24H Shela Leff, MD 200 mL/hr at 06/11/21 0019 1 g at 06/11/21 0019   gabapentin (NEURONTIN) capsule 300 mg  300  mg Oral BID Shela Leff, MD   300 mg at 06/11/21 [id number]   gabapentin (NEURONTIN) capsule 400 mg  400 mg Oral QHS Shela Leff, MD   400 mg at 06/10/21 2351   insulin aspart (novoLOG) injection 0-5 Units  0-5 Units Subcutaneous QHS Shela Leff, MD       insulin aspart (novoLOG) injection 0-6 Units  0-6 Units Subcutaneous TID WC Shela Leff, MD   1 Units at 06/11/21 0858    linagliptin (TRADJENTA) tablet 5 mg  5 mg Oral Daily [place] Donnamarie Poag, DO   5 mg at 06/11/21 0900   methylPREDNISolone sodium succinate (SOLU-MEDROL) 125 mg/2 mL injection 60 mg  60 mg Intravenous Q12H [place] Eric J, DO   60 mg at 06/11/21 [id number]   remdesivir 100 mg in sodium chloride 0.9 % 100 mL IVPB  100 mg Intravenous Daily Shela Leff, MD 200 mL/hr at 06/11/21 0924 100 mg at 06/11/21 [id number]     Discharge Medications: Please see discharge summary for a list of discharge medications.  Relevant Imaging Results:  Relevant Lab Results:   Additional Information SSN# [id number]  Tom-Johnson, Renea Ee, RN

## 2021-06-12 LAB — BASIC METABOLIC PANEL
Anion gap: 11 (ref 5–15)
BUN: 44 mg/dL — ABNORMAL HIGH (ref 8–23)
CO2: 20 mmol/L — ABNORMAL LOW (ref 22–32)
Calcium: 9.8 mg/dL (ref 8.9–10.3)
Chloride: 105 mmol/L (ref 98–111)
Creatinine, Ser: 0.93 mg/dL (ref 0.44–1.00)
GFR, Estimated: 58 mL/min — ABNORMAL LOW (ref >60)
Glucose, Bld: 192 mg/dL — ABNORMAL HIGH (ref 70–99)
Potassium: 4.8 mmol/L (ref 3.5–5.1)
Sodium: 136 mmol/L (ref 135–145)

## 2021-06-12 LAB — C-REACTIVE PROTEIN: CRP: 2.7 mg/dL — ABNORMAL HIGH (ref <1.0)

## 2021-06-12 LAB — CBC
HCT: 46 % (ref 36.0–46.0)
Hemoglobin: 15.4 g/dL — ABNORMAL HIGH (ref 12.0–15.0)
MCH: 30.4 pg (ref 26.0–34.0)
MCHC: 33.5 g/dL (ref 30.0–36.0)
MCV: 90.9 fL (ref 80.0–100.0)
Platelets: 168 10*3/uL (ref 150–400)
RBC: 5.06 MIL/uL (ref 3.87–5.11)
RDW: 14.3 % (ref 11.5–15.5)
WBC: 8.7 10*3/uL (ref 4.0–10.5)
nRBC: 0 % (ref 0.0–0.2)

## 2021-06-12 LAB — COMPREHENSIVE METABOLIC PANEL
ALT: 19 U/L (ref 0–44)
AST: 35 U/L (ref 15–41)
Albumin: 2.8 g/dL — ABNORMAL LOW (ref 3.5–5.0)
Alkaline Phosphatase: 90 U/L (ref 38–126)
Anion gap: 8 (ref 5–15)
BUN: 43 mg/dL — ABNORMAL HIGH (ref 8–23)
CO2: 19 mmol/L — ABNORMAL LOW (ref 22–32)
Calcium: 9.7 mg/dL (ref 8.9–10.3)
Chloride: 105 mmol/L (ref 98–111)
Creatinine, Ser: 0.97 mg/dL (ref 0.44–1.00)
GFR, Estimated: 55 mL/min — ABNORMAL LOW (ref >60)
Glucose, Bld: 179 mg/dL — ABNORMAL HIGH (ref 70–99)
Potassium: 5.4 mmol/L — ABNORMAL HIGH (ref 3.5–5.1)
Sodium: 132 mmol/L — ABNORMAL LOW (ref 135–145)
Total Bilirubin: 1.2 mg/dL (ref 0.3–1.2)
Total Protein: 6.1 g/dL — ABNORMAL LOW (ref 6.5–8.1)

## 2021-06-12 LAB — D-DIMER, QUANTITATIVE: D-Dimer, Quant: >20 ug/mL-FEU — ABNORMAL HIGH (ref 0.00–0.50)

## 2021-06-12 LAB — URINE CULTURE: Culture: >=100000 — AB

## 2021-06-12 LAB — GLUCOSE, CAPILLARY
Glucose-Capillary: 188 mg/dL — ABNORMAL HIGH (ref 70–99)
Glucose-Capillary: 272 mg/dL — ABNORMAL HIGH (ref 70–99)
Glucose-Capillary: 294 mg/dL — ABNORMAL HIGH (ref 70–99)
Glucose-Capillary: 291 mg/dL — ABNORMAL HIGH (ref 70–99)

## 2021-06-12 LAB — FERRITIN: Ferritin: 375 ng/mL — ABNORMAL HIGH (ref 11–307)

## 2021-06-12 MED ORDER — HYDRALAZINE HCL 25 MG PO TABS
25.0000 mg | ORAL_TABLET | Freq: Three times a day (TID) | ORAL | Status: DC
  Administered 2021-06-12 – 2021-06-13 (×5): 25 mg via ORAL
  Filled 2021-06-12 (×7): qty 1

## 2021-06-12 MED ORDER — HYDRALAZINE HCL 20 MG/ML IJ SOLN
10.0000 mg | Freq: Four times a day (QID) | INTRAMUSCULAR | Status: DC | PRN
  Administered 2021-06-12 – 2021-06-14 (×2): 10 mg via INTRAVENOUS
  Filled 2021-06-12 (×4): qty 1

## 2021-06-12 NOTE — Progress Notes (Addendum)
PROGRESS NOTE    Jessica [name]  [id number] DOB: Oct 30, 1927 DOA: 06/09/2021 PCP: Binnie Rail, MD   Brief Narrative: 87 year old with past medical history significant for CAD status post PCI, chronic combined diastolic and systolic congestive heart failure, permanent A. fib on Eliquis, SSS s/p PPM, CKD stage IIIb, type 2 diabetes mellitus recent diet controlled, history of breast cancer, GERD, essential hypertension, hyperlipidemia who presents via EMS with generalized weakness, malaise and low-grade fever.  Patient reported positive home COVID test the day prior to admission.  Unvaccinated.  Patient was confused on admission.  She report cough and urinary frequency. On arrival patient oxygen saturation was 90% on room air, subsequently oxygen saturation down to 87% on RA. .  Currently on 2 L of oxygen  Patient found to have COVID PCR positive, UA with many bacteria 21-50 white blood cell.  CT chest: Showed a small right side pleural effusion, bibasilar atelectatic changes.  Assessment & Plan:   Principal Problem:   Acute hypoxemic respiratory failure (HCC) Active Problems:   Diabetes ([place])   Hypertension   UTI (urinary tract infection)   Acute-on-chronic kidney injury ([place])   Acute respiratory failure with hypoxia (HCC)   1-Acute Hypoxic Respiratory Failure secondary to acute COVID-19 viral pneumonia: Patient oxygen saturation on room air reported 87% (8/09) COVID test + 06/09/2021 COVID-19 Labs  Recent Labs    06/10/21 0412 06/10/21 0413 06/11/21 0041 06/12/21 0147 06/12/21 0255  DDIMER >20.00*  --  >20.00*  --  >20.00*  FERRITIN  --  176 286 375*  --   LDH  --  225*  --   --   --   CRP  --  2.5* 3.0* 2.7*  --   Inflammatory markers trending down. Continue oxygen supplementation. Continue with Remdesivir day 3/5. Continue with Solu-Medrol  2-Acute renal failure on CKD stage IIIb: Resolved Patient presented with a creatinine 1.6.  Creatinine baseline 1.1. Likely  prerenal in the setting of poor oral intake. Plan to continue to hold ACE.  Plan to resume Lasix tomorrow.   3-Klebsiella Ornithinolytica UTI:  Patient presented with urinary frequency, UA with large leukocyte, positive nitrates, white blood cell 21-50. Urine culture growing more than 100 K Klebsiella Continue with IV ceftriaxone, plan to treat for 3 days.  4-CAD status post PCI Continue with carvedilol on Eliquis.  Chronic combined systolic and diastolic heart failure: Compensated Echo 2018 ejection fraction 30 to 35%. Plan to resume Lasix tomorrow.  Continue with carvedilol  Permanent A. fib on anticoagulation, SSS s/p pacemaker Continue with Eliquis and carvedilol  Diabetes type 2: Continue with sliding scale insulin  Peripheral neuropathy: Continue gabapentin Hypertension: Continue with carvedilol.  Holding lisinopril due to AKI.  BP elevated, start Hydralazine.  Hyperlipidemia: Previously on pravastatin DC'd for memory issues.  On omega-3 fatty acid Thyroid hypodensity: Incidental finding on CT Is deconditioning PT OT recommending SNF    Lab Results  Component Value Date   SARSCOV2NAA POSITIVE (A) 06/10/2021       Pressure Injury 10/31/17 Stage II -  Partial thickness loss of dermis presenting as a shallow open ulcer with a red, pink wound bed without slough. Redness and open areas (Active)  10/31/17 0330  Location: Sacrum  Location Orientation: Mid  Staging: Stage II -  Partial thickness loss of dermis presenting as a shallow open ulcer with a red, pink wound bed without slough.  Wound Description (Comments): Redness and open areas  Present on Admission: Yes    Estimated  body mass index is 26.61 kg/m as calculated from the following:   Height as of this encounter: '5\' 4"'$  (1.626 m).   Weight as of this encounter: 70.3 kg.   DVT prophylaxis: Eliquis Code Status: Full code Family Communication: Daughter over phone 8/10 Disposition Plan:  Status is:  Inpatient  Remains inpatient appropriate because:IV treatments appropriate due to intensity of illness or inability to take PO  Dispo:  Patient From: Home  Planned Disposition: [place]  Medically stable for discharge: No          Consultants:  None  Procedures:  None  Antimicrobials:    Subjective: She is alert, confuse. Has productive cough.  Denies dyspnea.   Objective: Vitals:   06/12/21 0325 06/12/21 0736 06/12/21 0913 06/12/21 1102  BP: (!) 155/81 (!) 168/93 (!) 152/65 (!) 165/80  Pulse: 71 76    Resp: 20 20  (!) 22  Temp: 98 F (36.7 C) 98 F (36.7 C)  97.6 F (36.4 C)  TempSrc: Oral Oral  Axillary  SpO2: 94% 97%    Weight:      Height:        Intake/Output Summary (Last 24 hours) at 06/12/2021 1400 Last data filed at 06/12/2021 1333 Gross per 24 hour  Intake 809.63 ml  Output 800 ml  Net 9.63 ml   Filed Weights   06/10/21 0013  Weight: 70.3 kg    Examination:  General exam: Appears calm and comfortable  Respiratory system: BL ronchus Cardiovascular system: S1 & S2 heard, IRR. No JVD, murmurs, rubs, gallops or clicks. No pedal edema. Gastrointestinal system: Abdomen is nondistended, soft and nontender. No organomegaly or masses felt. Normal bowel sounds heard. Central nervous system: Alert  Extremities: Symmetric 5 x 5 power.   Data Reviewed: I have personally reviewed following labs and imaging studies  CBC: Recent Labs  Lab 06/09/21 1649 06/11/21 0630 06/12/21 0147  WBC 5.6 6.4 8.7  NEUTROABS 3.5  --   --   HGB 13.5 15.2* 15.4*  HCT 42.1 46.9* 46.0  MCV 93.6 95.1 90.9  PLT 192 145* [id number]   Basic Metabolic Panel: Recent Labs  Lab 06/09/21 1649 06/10/21 0415 06/11/21 0041 06/12/21 0147 06/12/21 0255  NA 139 138 137 132* 136  K 4.5 4.7 4.8 5.4* 4.8  CL 107 107 106 105 105  CO2 26 22 18* 19* 20*  GLUCOSE 163* 118* 156* 179* 192*  BUN 37* 40* 39* 43* 44*  CREATININE 1.61* 1.46* 1.09* 0.97 0.93  CALCIUM 9.8  9.5 9.7 9.7 9.8   GFR: Estimated Creatinine Clearance: 37.1 mL/min (by C-G formula based on SCr of 0.93 mg/dL). Liver Function Tests: Recent Labs  Lab 06/10/21 0415 06/11/21 0041 06/12/21 0147  AST 32 34 35  ALT '23 23 19  '$ ALKPHOS 84 100 90  BILITOT 0.8 1.1 1.2  PROT 6.2* 6.4* 6.1*  ALBUMIN 3.0* 2.9* 2.8*   No results for input(s): LIPASE, AMYLASE in the last 168 hours. No results for input(s): AMMONIA in the last 168 hours. Coagulation Profile: No results for input(s): INR, PROTIME in the last 168 hours. Cardiac Enzymes: No results for input(s): CKTOTAL, CKMB, CKMBINDEX, TROPONINI in the last 168 hours. BNP (last 3 results) Recent Labs    09/03/20 1637  PROBNP 4,231*   HbA1C: Recent Labs    06/10/21 0412  HGBA1C 6.8*   CBG: Recent Labs  Lab 06/11/21 1201 06/11/21 1638 06/11/21 2046 06/12/21 0734 06/12/21 1129  GLUCAP 233* 259* 234* 188* 272*  Lipid Profile: No results for input(s): CHOL, HDL, LDLCALC, TRIG, CHOLHDL, LDLDIRECT in the last 72 hours. Thyroid Function Tests: No results for input(s): TSH, T4TOTAL, FREET4, T3FREE, THYROIDAB in the last 72 hours. Anemia Panel: Recent Labs    06/11/21 0041 06/12/21 0147  FERRITIN 286 375*   Sepsis Labs: No results for input(s): PROCALCITON, LATICACIDVEN in the last 168 hours.  Recent Results (from the past 240 hour(s))  Urine Culture     Status: Abnormal   Collection Time: 06/09/21 11:05 PM   Specimen: Urine, Clean Catch  Result Value Ref Range Status   Specimen Description URINE, CLEAN CATCH  Final   Special Requests   Final    NONE Performed at [facility], [address] [place], [place] [address]    Culture >=100,000 COLONIES/mL KLEBSIELLA ORNITHINOLYTICA (A)  Final   Report Status 06/12/2021 FINAL  Final   Organism ID, Bacteria KLEBSIELLA ORNITHINOLYTICA (A)  Final      Susceptibility   Klebsiella ornithinolytica - MIC*    AMPICILLIN RESISTANT Resistant     CEFAZOLIN <=4 SENSITIVE  Sensitive     CEFEPIME <=0.12 SENSITIVE Sensitive     CEFTRIAXONE <=0.25 SENSITIVE Sensitive     CIPROFLOXACIN <=0.25 SENSITIVE Sensitive     GENTAMICIN <=1 SENSITIVE Sensitive     IMIPENEM <=0.25 SENSITIVE Sensitive     NITROFURANTOIN <=16 SENSITIVE Sensitive     TRIMETH/SULFA <=20 SENSITIVE Sensitive     AMPICILLIN/SULBACTAM 4 SENSITIVE Sensitive     PIP/TAZO <=4 SENSITIVE Sensitive     * >=100,000 COLONIES/mL KLEBSIELLA ORNITHINOLYTICA  Resp Panel by RT-PCR (Flu A&B, Covid) Nasopharyngeal Swab     Status: Abnormal   Collection Time: 06/10/21 12:12 AM   Specimen: Nasopharyngeal Swab; Nasopharyngeal(NP) swabs in vial transport medium  Result Value Ref Range Status   SARS Coronavirus 2 by RT PCR POSITIVE (A) NEGATIVE Final    Comment: RESULT CALLED TO, READ BACK BY AND VERIFIED WITH: S ROWLAND,RN'@0123'$  06/10/21 MKELLY (NOTE) SARS-CoV-2 target nucleic acids are DETECTED.  The SARS-CoV-2 RNA is generally detectable in upper respiratory specimens during the acute phase of infection. Positive results are indicative of the presence of the identified virus, but do not rule out bacterial infection or co-infection with other pathogens not detected by the test. Clinical correlation with patient history and other diagnostic information is necessary to determine patient infection status. The expected result is Negative.  Fact Sheet for Patients: EntrepreneurPulse.com.au  Fact Sheet for Healthcare Providers: IncredibleEmployment.be  This test is not yet approved or cleared by the [place] FDA and  has been authorized for detection and/or diagnosis of SARS-CoV-2 by FDA under an Emergency Use Authorization (EUA).  This EUA will remain in effect (meaning this test can be  used) for the duration of  the COVID-19 declaration under Section 564(b)(1) of the Act, 21 U.S.C. section 360bbb-3(b)(1), unless the authorization is terminated or revoked sooner.      Influenza A by PCR NEGATIVE NEGATIVE Final   Influenza B by PCR NEGATIVE NEGATIVE Final    Comment: (NOTE) The Xpert Xpress SARS-CoV-2/FLU/RSV plus assay is intended as an aid in the diagnosis of influenza from Nasopharyngeal swab specimens and should not be used as a sole basis for treatment. Nasal washings and aspirates are unacceptable for Xpert Xpress SARS-CoV-2/FLU/RSV testing.  Fact Sheet for Patients: EntrepreneurPulse.com.au  Fact Sheet for Healthcare Providers: IncredibleEmployment.be  This test is not yet approved or cleared by the [place] FDA and has been authorized for detection and/or  diagnosis of SARS-CoV-2 by FDA under an Emergency Use Authorization (EUA). This EUA will remain in effect (meaning this test can be used) for the duration of the COVID-19 declaration under Section 564(b)(1) of the Act, 21 U.S.C. section 360bbb-3(b)(1), unless the authorization is terminated or revoked.  Performed at [facility], [place] [address], [place], [place] [address]          Radiology Studies: No results found.      Scheduled Meds:  apixaban  5 mg Oral BID   carvedilol  25 mg Oral BID WC   gabapentin  300 mg Oral BID   gabapentin  400 mg Oral QHS   hydrALAZINE  25 mg Oral Q8H   insulin aspart  0-5 Units Subcutaneous QHS   insulin aspart  0-6 Units Subcutaneous TID WC   linagliptin  5 mg Oral Daily   methylPREDNISolone (SOLU-MEDROL) injection  60 mg Intravenous Q12H   Continuous Infusions:  cefTRIAXone (ROCEPHIN)  IV 1 g (06/12/21 0047)   remdesivir 100 mg in NS 100 mL 100 mg (06/12/21 0924)     LOS: 2 days    Time spent: 35 minutes    Itati Brocksmith A Lonni Dirden, MD [facility]   If 7PM-7AM, please contact night-coverage [email or web address]  06/12/2021, 2:00 PM

## 2021-06-12 NOTE — Plan of Care (Signed)

## 2021-06-12 NOTE — TOC Progression Note (Signed)
Transition of Care [facility]) - Progression Note    Patient Details  Name: Jessica [name] MRN: [id number] Date of Birth: [birth year]  Transition [facility] Care [facility]) CM/SW [place], LCSW Phone Number: 06/12/2021, 5:03 PM  Clinical Narrative:    CSW started insurance approval for [place] with Everlene Balls, Ref# [id number].   Expected Discharge Plan: [place] Barriers to Discharge: Continued Medical Work up  Expected Discharge Plan and Services Expected Discharge Plan: [place] In-house Referral: Clinical Social Work Discharge Planning Services: CM Consult Post Acute Care Choice: [place] arrangements for the past 2 months: [place] (With daughter)                                       Social Determinants of Health (SDOH) Interventions    Readmission Risk Interventions No flowsheet data found.

## 2021-06-12 NOTE — Progress Notes (Signed)
Physical Therapy Treatment Patient Details Name: Jessica [name] MRN: [id number] DOB: [birth year] Today's Date: 06/12/2021    History of Present Illness 87 yo female with onset of AMS and a positive covid test was brought to ED due to being more dependent and unable to be cared for at home.  Pt has UTI, on O2 now, has atelectasis and AKI, pleural effusion.  PMHx:  atherosclerosis, CKD 3b, a-fib, SSS, pacemaker, DM, periph neuropathy, HTN, CAD, CHF    PT Comments    Pt appears cognitively clearer today, A&O x4 and follows all commands well. Pt ambulatory in room with RW and light assist to steady. Pt with sats 89-96% on RA when an accurate-appearing SpO2 waveform is present, brief drop to 83% but pt quickly rebounded to upper 80s. PT to continue to follow.    Follow Up Recommendations  SNF     Equipment Recommendations  None recommended by PT    Recommendations for Other Services       Precautions / Restrictions Precautions Precautions: Fall Restrictions Weight Bearing Restrictions: No    Mobility  Bed Mobility Overal bed mobility: Needs Assistance Bed Mobility: Supine to Sit;Sit to Supine     Supine to sit: Min assist Sit to supine: Min assist   General bed mobility comments: min assist for trunk elevation off of bed, LE lifting back into bed, and boost up.    Transfers Overall transfer level: Needs assistance Equipment used: Rolling walker (2 wheeled) Transfers: Sit to/from Stand Sit to Stand: Min assist         General transfer comment: Min assist for rise, steady. VC for hand placement when rising.  Ambulation/Gait Ambulation/Gait assistance: Min assist Gait Distance (Feet): 60 Feet Assistive device: Rolling walker (2 wheeled) Gait Pattern/deviations: Step-through pattern;Decreased stride length;Trunk flexed;Drifts right/left Gait velocity: decr   General Gait Details: min assist to steady, navigate pt/RW in room. VC for upright posture, placement in  RW.Spo2 min 83% on RA with poor pleth, more likely 88-96% during mobility and pt with DOE 1/4.   Stairs             Wheelchair Mobility    Modified Rankin (Stroke Patients Only)       Balance Overall balance assessment: Needs assistance Sitting-balance support: No upper extremity supported;Feet unsupported Sitting balance-Leahy Scale: Fair     Standing balance support: Bilateral upper extremity supported;During functional activity Standing balance-Leahy Scale: Poor Standing balance comment: reliant on external support                            Cognition Arousal/Alertness: Awake/alert Behavior During Therapy: WFL for tasks assessed/performed Overall Cognitive Status: Impaired/Different from baseline Area of Impairment: Following commands;Safety/judgement;Problem solving                       Following Commands: Follows one step commands with increased time Safety/Judgement: Decreased awareness of safety   Problem Solving: Requires verbal cues;Requires tactile cues General Comments: A&Ox4. Pt follows all commands with increased time, laughing and smiling appropriately throughout session.      Exercises      General Comments        Pertinent Vitals/Pain Pain Assessment: Faces Faces Pain Scale: No hurt Pain Intervention(s): Limited activity within patient's tolerance;Monitored during session    Home Living                      Prior Function  PT Goals (current goals can now be found in the care plan section) Acute Rehab PT Goals Patient Stated Goal: none stated PT Goal Formulation: With patient Time For Goal Achievement: 06/24/21 Potential to Achieve Goals: Fair Progress towards PT goals: Progressing toward goals    Frequency    Min 3X/week      PT Plan Current plan remains appropriate    Co-evaluation              AM-PAC PT "6 Clicks" Mobility   Outcome Measure  Help needed turning from your  back to your side while in a flat bed without using bedrails?: A Little Help needed moving from lying on your back to sitting on the side of a flat bed without using bedrails?: A Little Help needed moving to and from a bed to a chair (including a wheelchair)?: A Little Help needed standing up from a chair using your arms (e.g., wheelchair or bedside chair)?: A Little Help needed to walk in hospital room?: A Little Help needed climbing 3-5 steps with a railing? : A Lot 6 Click Score: 17    End of Session   Activity Tolerance: Patient limited by fatigue Patient left: in bed;with call bell/phone within reach;with bed alarm set Nurse Communication: Mobility status PT Visit Diagnosis: Muscle weakness (generalized) (M62.81);Other abnormalities of gait and mobility (R26.89)     Time: [id number] PT Time Calculation (min) (ACUTE ONLY): 17 min  Charges:  $Gait Training: 8-22 mins                     Stacie Glaze, PT DPT Acute Rehabilitation Services Pager [phone]  Office [phone]    Roxine Caddy E Ruffin Pyo 06/12/2021, 4:10 PM

## 2021-06-12 NOTE — Progress Notes (Signed)
Inpatient Diabetes Program Recommendations  AACE/ADA: New Consensus Statement on Inpatient Glycemic Control (2015)  Target Ranges:  Prepandial:   less than 140 mg/dL      Peak postprandial:   less than 180 mg/dL (1-2 hours)      Critically ill patients:  140 - 180 mg/dL   Lab Results  Component Value Date   GLUCAP 188 (H) 06/12/2021   HGBA1C 6.8 (H) 06/10/2021    Review of Glycemic Control Results for Jessica, [name] (MRN [id number]) as of 06/12/2021 10:51  Ref. Range 06/11/2021 08:12 06/11/2021 12:01 06/11/2021 16:38 06/11/2021 20:46 06/12/2021 07:34  Glucose-Capillary Latest Ref Range: 70 - 99 mg/dL 150 (H) 233 (H) 259 (H) 234 (H) 188 (H)   Diabetes history: DM 2 Outpatient Diabetes medications: Diet controlled Current orders for Inpatient glycemic control:  Novolog 0-6 units tid + hs Tradjenta 5 mg Daily Solumedrol 60 mg Q12 hours A1c 6.8% on 8/8  Inpatient Diabetes Program Recommendations:    -  add Novolog 2 units tid meal coverage if eating >50% of meals  Will follow glucose trends  Thanks,  Tama Headings RN, MSN, BC-ADM Inpatient Diabetes Coordinator Team Pager [phone] (8a-5p)

## 2021-06-13 LAB — CBC
HCT: 41.9 % (ref 36.0–46.0)
Hemoglobin: 14.6 g/dL (ref 12.0–15.0)
MCH: 30.8 pg (ref 26.0–34.0)
MCHC: 34.8 g/dL (ref 30.0–36.0)
MCV: 88.4 fL (ref 80.0–100.0)
Platelets: 184 10*3/uL (ref 150–400)
RBC: 4.74 MIL/uL (ref 3.87–5.11)
RDW: 14.1 % (ref 11.5–15.5)
WBC: 9.1 10*3/uL (ref 4.0–10.5)
nRBC: 0 % (ref 0.0–0.2)

## 2021-06-13 LAB — COMPREHENSIVE METABOLIC PANEL
ALT: 22 U/L (ref 0–44)
AST: 24 U/L (ref 15–41)
Albumin: 2.6 g/dL — ABNORMAL LOW (ref 3.5–5.0)
Alkaline Phosphatase: 78 U/L (ref 38–126)
Anion gap: 6 (ref 5–15)
BUN: 48 mg/dL — ABNORMAL HIGH (ref 8–23)
CO2: 23 mmol/L (ref 22–32)
Calcium: 9.7 mg/dL (ref 8.9–10.3)
Chloride: 105 mmol/L (ref 98–111)
Creatinine, Ser: 1.16 mg/dL — ABNORMAL HIGH (ref 0.44–1.00)
GFR, Estimated: 44 mL/min — ABNORMAL LOW (ref >60)
Glucose, Bld: 194 mg/dL — ABNORMAL HIGH (ref 70–99)
Potassium: 4.5 mmol/L (ref 3.5–5.1)
Sodium: 134 mmol/L — ABNORMAL LOW (ref 135–145)
Total Bilirubin: 0.6 mg/dL (ref 0.3–1.2)
Total Protein: 5.8 g/dL — ABNORMAL LOW (ref 6.5–8.1)

## 2021-06-13 LAB — GLUCOSE, CAPILLARY
Glucose-Capillary: 212 mg/dL — ABNORMAL HIGH (ref 70–99)
Glucose-Capillary: 219 mg/dL — ABNORMAL HIGH (ref 70–99)
Glucose-Capillary: 259 mg/dL — ABNORMAL HIGH (ref 70–99)
Glucose-Capillary: 255 mg/dL — ABNORMAL HIGH (ref 70–99)

## 2021-06-13 LAB — D-DIMER, QUANTITATIVE: D-Dimer, Quant: >20 ug/mL-FEU — ABNORMAL HIGH (ref 0.00–0.50)

## 2021-06-13 LAB — C-REACTIVE PROTEIN: CRP: 2.1 mg/dL — ABNORMAL HIGH (ref <1.0)

## 2021-06-13 LAB — FERRITIN: Ferritin: 298 ng/mL (ref 11–307)

## 2021-06-13 NOTE — TOC Progression Note (Addendum)
Transition of Care [facility]) - Progression Note    Patient Details  Name: ADRIAN PRUSKI MRN: [id number] Date of Birth: 1927/03/17  Transition of Care [facility]) CM/SW [place], LCSW Phone Number: 06/13/2021, 9:50 AM  Clinical Narrative:    9:50am-[employer] insurance approval received for [facility]: Ref#  [id number], Auth ID # [id number], effective 06/13/2021-06/17/2021. [facility] able to accept patient tomorrow if stable but not on the weekend.   3:40pm-CSW left voicemail for patient's daughter.  4:50pm- CSW received return call from patient's daughter. CSW provided update on possible discharge to [facility] tomorrow. She is requesting [facility] for transport.   Expected Discharge Plan: [place] Barriers to Discharge: Continued Medical Work up  Expected Discharge Plan and Services Expected Discharge Plan: [place] In-house Referral: Clinical Social Work Discharge Planning Services: CM Consult Post Acute Care Choice: [place] arrangements for the past 2 months: [place] (With daughter)                                       Social Determinants of Health (SDOH) Interventions    Readmission Risk Interventions No flowsheet data found.

## 2021-06-13 NOTE — Progress Notes (Signed)
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 fib on Eliquis, SSS s/p PPM, CKD stage IIIb, type 2 diabetes mellitus recent diet controlled, history of breast cancer, GERD, essential hypertension, hyperlipidemia who presents via EMS with generalized weakness, malaise and low-grade fever.  Patient reported positive home COVID test the day prior to admission.  Unvaccinated.  Patient was confused on admission.  She report cough and urinary frequency. On arrival patient oxygen saturation was 90% on room air, subsequently oxygen saturation down to 87% on RA. .  Currently on 2 L of oxygen  Patient found to have COVID PCR positive, UA with many bacteria 21-50 white blood cell.  CT chest: Showed a small right side pleural effusion, bibasilar atelectatic changes.  Assessment & Plan:   Principal Problem:   Acute hypoxemic respiratory failure (HCC) Active Problems:   Diabetes ([place])   Hypertension   UTI (urinary tract infection)   Acute-on-chronic kidney injury ([place])   Acute respiratory failure with hypoxia (HCC)   1-Acute Hypoxic Respiratory Failure secondary to acute COVID-19 viral pneumonia: Patient oxygen saturation on room air reported 87% (8/09) COVID test + 06/09/2021 COVID-19 Labs  Recent Labs    06/11/21 0041 06/12/21 0147 06/12/21 0255 06/13/21 0253  DDIMER >20.00*  --  >20.00* >20.00*  FERRITIN 286 375*  --  298  CRP 3.0* 2.7*  --  2.1*   Inflammatory markers trending down. Continue oxygen supplementation. Continue with Remdesivir day 4/5. Continue with Solu-Medrol  2-Acute renal failure on CKD stage IIIb: Resolved Patient presented with a creatinine 1.6.  Creatinine baseline 1.1. Likely prerenal in the setting of poor oral intake. Plan to continue  to hold ACE.  Negative 1.5. continue to hold lasix.    3-Klebsiella Ornithinolytica UTI:  Patient presented with urinary frequency, UA with large leukocyte, positive nitrates, white blood cell 21-50. Urine culture growing more than 100 K Klebsiella Treated with ceftriaxone for 3 days, ceftriaxone stopped /8/11  4-CAD status post PCI Continue with carvedilol on Eliquis.  Chronic combined systolic and diastolic heart failure: Compensated Echo 2018 ejection fraction 30 to 35%. Continue with carvedilol Resume lasix at discharge.   Permanent A. fib on anticoagulation, SSS s/p pacemaker Continue with Eliquis and carvedilol  Diabetes type 2: Continue with sliding scale insulin  Peripheral neuropathy: Continue gabapentin Hypertension: Continue with carvedilol.  Holding lisinopril due to AKI.  BP elevated, started  Hydralazine.  Hyperlipidemia: Previously on pravastatin DC'd for memory issues.  On omega-3 fatty acid Thyroid hypodensity: Incidental finding on CT deconditioning PT OT recommending SNF    Lab Results  Component Value Date   SARSCOV2NAA POSITIVE (A) 06/10/2021        Pressure Injury 10/31/17 Stage II -  Partial thickness loss of dermis presenting as a shallow open ulcer with a red, pink wound bed without slough. Redness and open areas (Active)  10/31/17 0330  Location: Sacrum  Location Orientation: Mid  Staging: Stage II -  Partial thickness loss of dermis presenting as a shallow open ulcer with a red, pink wound bed without slough.  Wound Description (Comments): Redness and open areas  Present on Admission: Yes    Estimated body mass index is 26.61 kg/m as calculated from the following:   Height as of this encounter: '5\' 4"'$  (1.626 m).  Weight as of this encounter: 70.3 kg.   DVT prophylaxis: Eliquis Code Status: Full code Family Communication: Daughter over phone 8/10 Disposition Plan:  Status is: Inpatient  Remains inpatient appropriate because:IV  treatments appropriate due to intensity of illness or inability to take PO  Dispo:  Patient From: Home  Planned Disposition: [place]  Medically stable for discharge: No          Consultants:  None  Procedures:  None  Antimicrobials:    Subjective: She is breathing better   Objective: Vitals:   06/13/21 0457 06/13/21 0745 06/13/21 1300 06/13/21 1607  BP: (!) 164/80 (!) 172/75 (!) 141/69 (!) 147/72  Pulse: 69 71 71 72  Resp: '18 20 18 20  '$ Temp: 97.8 F (36.6 C) (!) 97.5 F (36.4 C) 97.6 F (36.4 C) 98.3 F (36.8 C)  TempSrc: Axillary Oral Axillary Oral  SpO2: 94% 96% 96% 94%  Weight:      Height:        Intake/Output Summary (Last 24 hours) at 06/13/2021 1621 Last data filed at 06/13/2021 1613 Gross per 24 hour  Intake 300.06 ml  Output 2050 ml  Net -1749.94 ml    Filed Weights   06/10/21 0013  Weight: 70.3 kg    Examination:  General exam: NAD Respiratory system: CTA Cardiovascular system: S 1, S 2 RRR Gastrointestinal system: BS present, soft, nt Central nervous system: alert Extremities: no edema   Data Reviewed: I have personally reviewed following labs and imaging studies  CBC: Recent Labs  Lab 06/09/21 1649 06/11/21 0630 06/12/21 0147 06/13/21 0253  WBC 5.6 6.4 8.7 9.1  NEUTROABS 3.5  --   --   --   HGB 13.5 15.2* 15.4* 14.6  HCT 42.1 46.9* 46.0 41.9  MCV 93.6 95.1 90.9 88.4  PLT 192 145* 168 [id number]    Basic Metabolic Panel: Recent Labs  Lab 06/10/21 0415 06/11/21 0041 06/12/21 0147 06/12/21 0255 06/13/21 0253  NA 138 137 132* 136 134*  K 4.7 4.8 5.4* 4.8 4.5  CL 107 106 105 105 105  CO2 22 18* 19* 20* 23  GLUCOSE 118* 156* 179* 192* 194*  BUN 40* 39* 43* 44* 48*  CREATININE 1.46* 1.09* 0.97 0.93 1.16*  CALCIUM 9.5 9.7 9.7 9.8 9.7    GFR: Estimated Creatinine Clearance: 29.8 mL/min (A) (by C-G formula based on SCr of 1.16 mg/dL (H)). Liver Function Tests: Recent Labs  Lab 06/10/21 0415  06/11/21 0041 06/12/21 0147 06/13/21 0253  AST 32 34 35 24  ALT '23 23 19 22  '$ ALKPHOS 84 100 90 78  BILITOT 0.8 1.1 1.2 0.6  PROT 6.2* 6.4* 6.1* 5.8*  ALBUMIN 3.0* 2.9* 2.8* 2.6*    No results for input(s): LIPASE, AMYLASE in the last 168 hours. No results for input(s): AMMONIA in the last 168 hours. Coagulation Profile: No results for input(s): INR, PROTIME in the last 168 hours. Cardiac Enzymes: No results for input(s): CKTOTAL, CKMB, CKMBINDEX, TROPONINI in the last 168 hours. BNP (last 3 results) Recent Labs    09/03/20 1637  PROBNP 4,231*    HbA1C: No results for input(s): HGBA1C in the last 72 hours.  CBG: Recent Labs  Lab 06/12/21 1645 06/12/21 2052 06/13/21 0742 06/13/21 1258 06/13/21 1610  GLUCAP 294* 291* 212* 219* 259*    Lipid Profile: No results for input(s): CHOL, HDL, LDLCALC, TRIG, CHOLHDL, LDLDIRECT in the last 72 hours. Thyroid Function Tests: No results for input(s): TSH, T4TOTAL, FREET4, T3FREE, THYROIDAB in the  last 72 hours. Anemia Panel: Recent Labs    06/12/21 0147 06/13/21 0253  FERRITIN 375* 298    Sepsis Labs: No results for input(s): PROCALCITON, LATICACIDVEN in the last 168 hours.  Recent Results (from the past 240 hour(s))  Urine Culture     Status: Abnormal   Collection Time: 06/09/21 11:05 PM   Specimen: Urine, Clean Catch  Result Value Ref Range Status   Specimen Description URINE, CLEAN CATCH  Final   Special Requests   Final    NONE Performed at [facility], [address], [place], [place] [address]    Culture >=100,000 COLONIES/mL KLEBSIELLA ORNITHINOLYTICA (A)  Final   Report Status 06/12/2021 FINAL  Final   Organism ID, Bacteria KLEBSIELLA ORNITHINOLYTICA (A)  Final      Susceptibility   Klebsiella ornithinolytica - MIC*    AMPICILLIN RESISTANT Resistant     CEFAZOLIN <=4 SENSITIVE Sensitive     CEFEPIME <=0.12 SENSITIVE Sensitive     CEFTRIAXONE <=0.25 SENSITIVE Sensitive     CIPROFLOXACIN <=0.25  SENSITIVE Sensitive     GENTAMICIN <=1 SENSITIVE Sensitive     IMIPENEM <=0.25 SENSITIVE Sensitive     NITROFURANTOIN <=16 SENSITIVE Sensitive     TRIMETH/SULFA <=20 SENSITIVE Sensitive     AMPICILLIN/SULBACTAM 4 SENSITIVE Sensitive     PIP/TAZO <=4 SENSITIVE Sensitive     * >=100,000 COLONIES/mL KLEBSIELLA ORNITHINOLYTICA  Resp Panel by RT-PCR (Flu A&B, Covid) Nasopharyngeal Swab     Status: Abnormal   Collection Time: 06/10/21 12:12 AM   Specimen: Nasopharyngeal Swab; Nasopharyngeal(NP) swabs in vial transport medium  Result Value Ref Range Status   SARS Coronavirus 2 by RT PCR POSITIVE (A) NEGATIVE Final    Comment: RESULT CALLED TO, READ BACK BY AND VERIFIED WITH: S ROWLAND,RN'@[email or web address]'$  06/10/21 MKELLY (NOTE) SARS-CoV-2 target nucleic acids are DETECTED.  The SARS-CoV-2 RNA is generally detectable in upper respiratory specimens during the acute phase of infection. Positive results are indicative of the presence of the identified virus, but do not rule out bacterial infection or co-infection with other pathogens not detected by the test. Clinical correlation with patient history and other diagnostic information is necessary to determine patient infection status. The expected result is Negative.  Fact Sheet for Patients: EntrepreneurPulse.com.au  Fact Sheet for Healthcare Providers: IncredibleEmployment.be  This test is not yet approved or cleared by the [place] FDA and  has been authorized for detection and/or diagnosis of SARS-CoV-2 by FDA under an Emergency Use Authorization (EUA).  This EUA will remain in effect (meaning this test can be  used) for the duration of  the COVID-19 declaration under Section 564(b)(1) of the Act, 21 U.S.C. section 360bbb-3(b)(1), unless the authorization is terminated or revoked sooner.     Influenza A by PCR NEGATIVE NEGATIVE Final   Influenza B by PCR NEGATIVE NEGATIVE Final    Comment: (NOTE) The  Xpert Xpress SARS-CoV-2/FLU/RSV plus assay is intended as an aid in the diagnosis of influenza from Nasopharyngeal swab specimens and should not be used as a sole basis for treatment. Nasal washings and aspirates are unacceptable for Xpert Xpress SARS-CoV-2/FLU/RSV testing.  Fact Sheet for Patients: EntrepreneurPulse.com.au  Fact Sheet for Healthcare Providers: IncredibleEmployment.be  This test is not yet approved or cleared by the [place] FDA and has been authorized for detection and/or diagnosis of SARS-CoV-2 by FDA under an Emergency Use Authorization (EUA). This EUA will remain in effect (meaning this test can be used) for the duration of the COVID-19 declaration  under Section 564(b)(1) of the Act, 21 U.S.C. section 360bbb-3(b)(1), unless the authorization is terminated or revoked.  Performed at [facility], [place] [address], [place], [place] [address]           Radiology Studies: No results found.      Scheduled Meds:  apixaban  5 mg Oral BID   carvedilol  25 mg Oral BID WC   gabapentin  300 mg Oral BID   gabapentin  400 mg Oral QHS   hydrALAZINE  25 mg Oral Q8H   insulin aspart  0-5 Units Subcutaneous QHS   insulin aspart  0-6 Units Subcutaneous TID WC   linagliptin  5 mg Oral Daily   methylPREDNISolone (SOLU-MEDROL) injection  60 mg Intravenous Q12H   Continuous Infusions:  cefTRIAXone (ROCEPHIN)  IV Stopped (06/13/21 0135)   remdesivir 100 mg in NS 100 mL 100 mg (06/13/21 0942)     LOS: 3 days    Time spent: 35 minutes    Jaymison Luber A Arlys Scatena, MD [facility]   If 7PM-7AM, please contact night-coverage [email or web address]  06/13/2021, 4:21 PM

## 2021-06-13 NOTE — Progress Notes (Signed)
Inpatient Diabetes Program Recommendations  AACE/ADA: New Consensus Statement on Inpatient Glycemic Control (2015)  Target Ranges:  Prepandial:   less than 140 mg/dL      Peak postprandial:   less than 180 mg/dL (1-2 hours)      Critically ill patients:  140 - 180 mg/dL   Lab Results  Component Value Date   GLUCAP 212 (H) 06/13/2021   HGBA1C 6.8 (H) 06/10/2021    Review of Glycemic Control Results for Jessica, [name] (MRN [id number]) as of 06/12/2021 10:51  Ref. Range 06/11/2021 08:12 06/11/2021 12:01 06/11/2021 16:38 06/11/2021 20:46 06/12/2021 07:34  Glucose-Capillary Latest Ref Range: 70 - 99 mg/dL 150 (H) 233 (H) 259 (H) 234 (H) 188 (H)  Results for Jessica, [name] (MRN [id number]) as of 06/13/2021 08:28  Ref. Range 06/12/2021 07:34 06/12/2021 11:29 06/12/2021 16:45 06/12/2021 20:52 06/13/2021 07:42  Glucose-Capillary Latest Ref Range: 70 - 99 mg/dL 188 (H) 272 (H) 294 (H) 291 (H) 212 (H)   Diabetes history: DM 2 Outpatient Diabetes medications: Diet controlled Current orders for Inpatient glycemic control:  Novolog 0-6 units tid + hs Tradjenta 5 mg Daily Solumedrol 60 mg Q12 hours A1c 6.8% on 8/8  Inpatient Diabetes Program Recommendations:    -  add Novolog 2 units tid meal coverage if eating >50% of meals  Will follow glucose trends  Thanks,  Tama Headings RN, MSN, BC-ADM Inpatient Diabetes Coordinator Team Pager [phone] (8a-5p)

## 2021-06-13 NOTE — Progress Notes (Signed)
Occupational Therapy Treatment Patient Details Name: Jessica [name] MRN: [id number] DOB: 03/12/1927 Today's Date: 06/13/2021    History of present illness 87 yo female with onset of AMS and a positive covid test was brought to ED due to being more dependent and unable to be cared for at home.  Pt has UTI, on O2 now, has atelectasis and AKI, pleural effusion.  PMHx:  atherosclerosis, CKD 3b, a-fib, SSS, pacemaker, DM, periph neuropathy, HTN, CAD, CHF   OT comments  Pt eager to get OOB. Min assist for all mobility with multimodal cues for technique. Ambulated to [facility] and then to chair with RW. Assist needed to manage mesh panties and for pericare.  Performed seated grooming with set up. [name]. HR 74 bpm. Pt remained up in recliner with chair alarm activated at end of session. Reviewed use of call button.   Follow Up Recommendations  SNF;Supervision/Assistance - 24 hour    Equipment Recommendations  Other (comment) (defer to next venue)    Recommendations for Other Services      Precautions / Restrictions Precautions Precautions: Fall Precaution Comments: COVID Restrictions Weight Bearing Restrictions: No       Mobility Bed Mobility Overal bed mobility: Needs Assistance Bed Mobility: Supine to Sit     Supine to sit: Min assist     General bed mobility comments: increased time, HOB up, use of rail, assist to raise trunk    Transfers Overall transfer level: Needs assistance Equipment used: Rolling walker (2 wheeled) Transfers: Sit to/from Stand Sit to Stand: Min assist         General transfer comment: Min assist for rise, steady. VC for hand placement when rising.    Balance Overall balance assessment: Needs assistance   Sitting balance-Leahy Scale: Fair     Standing balance support: Bilateral upper extremity supported;During functional activity Standing balance-Leahy Scale: Poor Standing balance comment: reliant on RW and min assist                            ADL either performed or assessed with clinical judgement   ADL Overall ADL's : Needs assistance/impaired     Grooming: Wash/dry hands;Wash/dry face;Sitting;Set up           Upper Body Dressing : Minimal assistance;Sitting       Toilet Transfer: Minimal assistance;Stand-pivot;BSC;RW   Toileting- Clothing Manipulation and Hygiene: Maximal assistance;Sit to/from stand               Vision       Perception     Praxis      Cognition Arousal/Alertness: Awake/alert Behavior During Therapy: WFL for tasks assessed/performed Overall Cognitive Status: Difficult to assess                         Following Commands: Follows one step commands with increased time (and multimodal cues)       General Comments: distracted by multiple lines        Exercises     Shoulder Instructions       General Comments      Pertinent Vitals/ Pain       Pain Assessment: Faces Faces Pain Scale: No hurt  Home Living  Prior Functioning/Environment              Frequency  Min 2X/week        Progress Toward Goals  OT Goals(current goals can now be found in the care plan section)  Progress towards OT goals: Progressing toward goals  Acute Rehab OT Goals Patient Stated Goal: to watch her stories OT Goal Formulation: With patient Time For Goal Achievement: 06/24/21 Potential to Achieve Goals: Good  Plan Discharge plan remains appropriate    Co-evaluation                 AM-PAC OT "6 Clicks" Daily Activity     Outcome Measure   Help from another person eating meals?: A Little Help from another person taking care of personal grooming?: A Little Help from another person toileting, which includes using toliet, bedpan, or urinal?: A Lot Help from another person bathing (including washing, rinsing, drying)?: A Lot Help from another person to put on and taking off  regular upper body clothing?: A Lot Help from another person to put on and taking off regular lower body clothing?: Total 6 Click Score: 13    End of Session Equipment Utilized During Treatment: Rolling walker;Gait belt  OT Visit Diagnosis: Other abnormalities of gait and mobility (R26.89);Muscle weakness (generalized) (M62.81);Other symptoms and signs involving cognitive function   Activity Tolerance Patient tolerated treatment well   Patient Left in chair;with call bell/phone within reach;with chair alarm set   Nurse Communication          Time: [phone] OT Time Calculation (min): 31 min  Charges: OT General Charges $OT Visit: 1 Visit OT Treatments $Self Care/Home Management : 23-37 mins  Nestor Lewandowsky, OTR/L Acute Rehabilitation Services Pager: [phone] Office: [phone]    Malka So 06/13/2021, 10:33 AM

## 2021-06-14 LAB — COMPREHENSIVE METABOLIC PANEL
ALT: 34 U/L (ref 0–44)
AST: 29 U/L (ref 15–41)
Albumin: 2.9 g/dL — ABNORMAL LOW (ref 3.5–5.0)
Alkaline Phosphatase: 74 U/L (ref 38–126)
Anion gap: 9 (ref 5–15)
BUN: 52 mg/dL — ABNORMAL HIGH (ref 8–23)
CO2: 19 mmol/L — ABNORMAL LOW (ref 22–32)
Calcium: 10.1 mg/dL (ref 8.9–10.3)
Chloride: 106 mmol/L (ref 98–111)
Creatinine, Ser: 1.17 mg/dL — ABNORMAL HIGH (ref 0.44–1.00)
GFR, Estimated: 44 mL/min — ABNORMAL LOW (ref >60)
Glucose, Bld: 189 mg/dL — ABNORMAL HIGH (ref 70–99)
Potassium: 4.8 mmol/L (ref 3.5–5.1)
Sodium: 134 mmol/L — ABNORMAL LOW (ref 135–145)
Total Bilirubin: 0.7 mg/dL (ref 0.3–1.2)
Total Protein: 6.2 g/dL — ABNORMAL LOW (ref 6.5–8.1)

## 2021-06-14 LAB — D-DIMER, QUANTITATIVE: D-Dimer, Quant: >20 ug/mL-FEU — ABNORMAL HIGH (ref 0.00–0.50)

## 2021-06-14 LAB — CBC
HCT: 44.9 % (ref 36.0–46.0)
Hemoglobin: 15.5 g/dL — ABNORMAL HIGH (ref 12.0–15.0)
MCH: 30.6 pg (ref 26.0–34.0)
MCHC: 34.5 g/dL (ref 30.0–36.0)
MCV: 88.7 fL (ref 80.0–100.0)
Platelets: 200 10*3/uL (ref 150–400)
RBC: 5.06 MIL/uL (ref 3.87–5.11)
RDW: 14.3 % (ref 11.5–15.5)
WBC: 9.8 10*3/uL (ref 4.0–10.5)
nRBC: 0 % (ref 0.0–0.2)

## 2021-06-14 LAB — C-REACTIVE PROTEIN: CRP: 2.4 mg/dL — ABNORMAL HIGH (ref <1.0)

## 2021-06-14 LAB — GLUCOSE, CAPILLARY
Glucose-Capillary: 227 mg/dL — ABNORMAL HIGH (ref 70–99)
Glucose-Capillary: 252 mg/dL — ABNORMAL HIGH (ref 70–99)

## 2021-06-14 LAB — FERRITIN: Ferritin: 301 ng/mL (ref 11–307)

## 2021-06-14 MED ORDER — HYDRALAZINE HCL 25 MG PO TABS: 25.0000 mg | ORAL_TABLET | Freq: Three times a day (TID) | ORAL | 0 refills | Status: DC

## 2021-06-14 MED ORDER — FUROSEMIDE 20 MG PO TABS: 20.0000 mg | ORAL_TABLET | Freq: Every day | ORAL | 3 refills | Status: DC

## 2021-06-14 MED ORDER — LINAGLIPTIN 5 MG PO TABS: 5.0000 mg | ORAL_TABLET | Freq: Every day | ORAL | 0 refills | Status: DC

## 2021-06-14 MED ORDER — PREDNISONE 10 MG PO TABS: 20.0000 mg | ORAL_TABLET | Freq: Every day | ORAL | 0 refills | Status: AC

## 2021-06-14 MED ORDER — GABAPENTIN 400 MG PO CAPS: 400.0000 mg | ORAL_CAPSULE | Freq: Every day | ORAL | 0 refills | Status: DC

## 2021-06-14 MED ORDER — GABAPENTIN 300 MG PO CAPS: 300.0000 mg | ORAL_CAPSULE | Freq: Two times a day (BID) | ORAL | 0 refills | Status: DC

## 2021-06-14 NOTE — Progress Notes (Signed)
Pt was discharged to [facility] picked up by [facility]. Report called to Sheppard Plumber, RN

## 2021-06-14 NOTE — Progress Notes (Signed)
Physical Therapy Treatment Patient Details Name: Jessica [name] MRN: [id number] DOB: Jan 21, 1927 Today's Date: 06/14/2021    History of Present Illness 87 yo female with onset of AMS and a positive covid test was brought to ED due to being more dependent and unable to be cared for at home.  Pt has UTI, on O2 now, has atelectasis and AKI, pleural effusion.  PMHx:  atherosclerosis, CKD 3b, a-fib, SSS, pacemaker, DM, periph neuropathy, HTN, CAD, CHF    PT Comments    Pt was seen for attempt to walk but had persistent issues of v tach interfering with mobility.  Pt is initially a bit tired as well, and collectively did move to side of bed but no farther.  Will continue to work with her as her telemetry permits, with focus on standing and gait as she is safely able to perform.  Pt is too tired for ex's but will also pursue strengthening and ROM to increase and improve quality of gait.   Follow Up Recommendations  SNF     Equipment Recommendations  None recommended by PT    Recommendations for Other Services       Precautions / Restrictions Precautions Precautions: Fall Precaution Comments: COVID Restrictions Weight Bearing Restrictions: No    Mobility  Bed Mobility Overal bed mobility: Needs Assistance Bed Mobility: Supine to Sit;Sit to Supine Rolling: Min assist   Supine to sit: Min assist Sit to supine: Min assist   General bed mobility comments: extra time and repetitive cues    Transfers Overall transfer level: Needs assistance               General transfer comment: deferred due to alarm for v tach  Ambulation/Gait             General Gait Details: deferred   Stairs             Wheelchair Mobility    Modified Rankin (Stroke Patients Only)       Balance Overall balance assessment: Needs assistance Sitting-balance support: Feet supported Sitting balance-Leahy Scale: Fair                                      Cognition  Arousal/Alertness: Awake/alert Behavior During Therapy: WFL for tasks assessed/performed                           Following Commands: Follows one step commands inconsistently;Follows one step commands with increased time Safety/Judgement: Decreased awareness of safety   Problem Solving: Slow processing;Requires verbal cues General Comments: pt is having a telemetry alarm and did not focus on PT instructions to deal with it      Exercises      General Comments General comments (skin integrity, edema, etc.): pt is up to side of bed with alarm for v tach going off fairly quickly.  returned to bed and alarm stopped, but returned quickly with reattempt.  Once back in bed alarmed again.      Pertinent Vitals/Pain Pain Assessment: Faces Faces Pain Scale: No hurt    Home Living                      Prior Function            PT Goals (current goals can now be found in the care plan section) Acute Rehab PT Goals  Patient Stated Goal: none stated Progress towards PT goals: Not progressing toward goals - comment    Frequency    Min 3X/week      PT Plan Current plan remains appropriate    Co-evaluation              AM-PAC PT "6 Clicks" Mobility   Outcome Measure  Help needed turning from your back to your side while in a flat bed without using bedrails?: A Little Help needed moving from lying on your back to sitting on the side of a flat bed without using bedrails?: A Little Help needed moving to and from a bed to a chair (including a wheelchair)?: A Little Help needed standing up from a chair using your arms (e.g., wheelchair or bedside chair)?: A Little Help needed to walk in hospital room?: A Little Help needed climbing 3-5 steps with a railing? : Total 6 Click Score: 16    End of Session   Activity Tolerance: Treatment limited secondary to medical complications (Comment) Patient left: in bed;with call bell/phone within reach;with bed alarm  set Nurse Communication: Mobility status PT Visit Diagnosis: Muscle weakness (generalized) (M62.81);Other abnormalities of gait and mobility (R26.89)     Time: [id number] PT Time Calculation (min) (ACUTE ONLY): 12 min  Charges:  $Therapeutic Activity: 8-22 mins            Ramond Dial 06/14/2021, 12:23 PM  Mee Hives, PT MS Acute Rehab Dept. Number: [facility]

## 2021-06-14 NOTE — Discharge Summary (Addendum)
Physician Discharge Summary  Jessica [name] [id number] DOB: [birth year] DOA: 06/09/2021  PCP: Binnie Rail, MD  Admit date: 06/09/2021 Discharge date: 06/14/2021  Admitted From: Home Disposition:  SNF  Recommendations for Outpatient Follow-up:  Follow up with PCP in 1-2 weeks Please obtain BMP/CBC in one week Monitor renal function and potasium , on initiation of lasix.     Discharge Condition: Stable. 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 fib on Eliquis, SSS s/p PPM, CKD stage IIIb, type 2 diabetes mellitus recent diet controlled, history of breast cancer, GERD, essential hypertension, hyperlipidemia who presents via EMS with generalized weakness, malaise and low-grade fever.  Patient reported positive home COVID test the day prior to admission.  Unvaccinated.  Patient was confused on admission.  She report cough and urinary frequency. On arrival patient oxygen saturation was 90% on room air, subsequently oxygen saturation down to 87% on RA. .  Currently on 2 L of oxygen   Patient found to have COVID PCR positive, UA with many bacteria 21-50 white blood cell.  CT chest: Showed a small right side pleural effusion, bibasilar atelectatic changes.    1-Acute Hypoxic Respiratory Failure secondary to acute COVID-19 viral pneumonia: Patient oxygen saturation on room air reported 87% (8/09) COVID test + 06/09/2021 COVID-19 Labs   Recent Labs (last 2 labs)         Recent Labs    06/11/21 0041 06/12/21 0147 06/12/21 0255 06/13/21 0253  DDIMER >20.00*  --  >20.00* >20.00*  FERRITIN 286 375*  --  298  CRP 3.0* 2.7*  --  2.1*     Inflammatory markers trending down. Continue oxygen supplementation. Continue with Remdesivir day 5/5. Received Solu-Medrol for 4 days, plan to discharge on  prednisone for 3 days.    2-Acute renal failure on CKD stage IIIb: Resolved Patient presented with a creatinine 1.6.  Creatinine baseline 1.1. Likely prerenal in the setting of poor oral intake. Plan to continue to hold ACE.  Negative 1.5.     3-Klebsiella Ornithinolytica UTI:  Patient presented with urinary frequency, UA with large leukocyte, positive nitrates, white blood cell 21-50. Urine culture growing more than 100 K Klebsiella Treated with ceftriaxone for 3 days, ceftriaxone stopped /8/11   4-CAD status post PCI Continue with carvedilol on Eliquis.   Chronic combined systolic and diastolic heart failure: Compensated Echo 2018 ejection fraction 30 to 35%. Continue with carvedilol Resume lasix at discharge.    Permanent A. fib on anticoagulation, SSS s/p pacemaker Continue with Eliquis and carvedilol   Diabetes type 2: Continue with linagliptin.  Peripheral neuropathy: Continue gabapentin Hypertension: Continue with carvedilol.  Holding lisinopril due to AKI.  BP elevated, started  Hydralazine.  Hyperlipidemia: Previously on pravastatin DC'd for memory issues.  On omega-3 fatty acid Thyroid hypodensity: Incidental finding on CT deconditioning PT OT recommending SNF       Recent Labs       Lab Results  Component Value Date    [place] (A) 06/10/2021                 Discharge Diagnoses:  Principal Problem:   Acute hypoxemic respiratory failure ([place]) Active Problems:   Diabetes ([place])   Hypertension   UTI (urinary tract infection)   Acute-on-chronic kidney injury ([place])   Acute respiratory failure with hypoxia [facility])    Discharge  Instructions  Discharge Instructions     Diet - low sodium heart healthy   Complete by: As directed    Increase activity slowly   Complete by: As directed       Allergies as of 06/14/2021       Reactions   Lyrica [pregabalin] Swelling   Caused weight gain   Amlodipine Swelling   Sulfonamide Derivatives  Rash        Medication List     STOP taking these medications    ECHINACEA PO   lisinopril 5 MG tablet Commonly known as: ZESTRIL   potassium chloride SA 20 MEQ tablet Commonly known as: KLOR-CON       TAKE these medications    acetaminophen 325 MG tablet Commonly known as: TYLENOL Take 650 mg by mouth every 6 (six) hours as needed for fever or mild pain.   b complex vitamins tablet Take 1 tablet by mouth daily.   carvedilol 25 MG tablet Commonly known as: COREG Take 1 tablet by mouth twice daily   docusate sodium 100 MG capsule Commonly known as: COLACE Take 100 mg by mouth at bedtime.   Eliquis 5 MG Tabs tablet Generic drug: apixaban Take 1 tablet by mouth twice daily   fexofenadine 180 MG tablet Commonly known as: ALLEGRA Take 180 mg by mouth daily as needed for allergies.   fish oil-omega-3 fatty acids 1000 MG capsule Take 1 g by mouth every morning.   furosemide 20 MG tablet Commonly known as: LASIX Take 1 tablet (20 mg total) by mouth daily. TAKE 2 TABLETS BY MOUTH IN THE MORNING, AND  1 TABLET IN THE AFTERNOON What changed:  how much to take how to take this when to take this   gabapentin 300 MG capsule Commonly known as: NEURONTIN Take 1 capsule (300 mg total) by mouth 2 (two) times daily. What changed: when to take this   gabapentin 400 MG capsule Commonly known as: NEURONTIN Take 1 capsule (400 mg total) by mouth at bedtime. What changed:  medication strength See the new instructions.   hydrALAZINE 25 MG tablet Commonly known as: APRESOLINE Take 1 tablet (25 mg total) by mouth every 8 (eight) hours.   linagliptin 5 MG Tabs tablet Commonly known as: TRADJENTA Take 1 tablet (5 mg total) by mouth daily. Start taking on: June 15, 2021   nitroGLYCERIN 0.4 MG SL tablet Commonly known as: NITROSTAT Place 1 tablet (0.4 mg total) under the tongue every 5 (five) minutes as needed for chest pain. x3 doses as needed for chest pain    predniSONE 10 MG tablet Commonly known as: DELTASONE Take 2 tablets (20 mg total) by mouth daily for 3 days.   Rollator Ultra-Light Misc With seat.  Diabetic neuropathy, knee arthritis        Contact information for after-discharge care     Destination     [facility] Preferred SNF .   Service: Skilled Nursing Contact information: [id number] [address] [phone]                    Allergies  Allergen Reactions   Lyrica [Pregabalin] Swelling    Caused weight gain   Amlodipine Swelling   Sulfonamide Derivatives Rash    Consultations: None   Procedures/Studies: DG Chest 1 View  Result Date: 06/09/2021 CLINICAL DATA:  SOB (shortness of breath) R06.02 (ICD-10-CM) EXAM: CHEST  1 VIEW COMPARISON:  10/30/2017. FINDINGS: Similar enlargement the cardiac silhouette.  Calcific atherosclerosis of the aorta. Similar chronically prominent right paratracheal stripe, likely vascular. Similar left subclavian approach dual lead cardiac rhythm maintenance device. Similar chronic prominence of the interstitial markings. Similar linear/streaky opacity at the left lung base, likely atelectasis/scar. No consolidation. No visible pleural effusions or pneumothorax. Right axillary clips. IMPRESSION: Chronic changes without evidence of acute cardiopulmonary disease. Electronically Signed   By: Margaretha Sheffield MD   On: 06/09/2021 17:09   CT HEAD WO CONTRAST (5MM)  Result Date: 06/10/2021 CLINICAL DATA:  Altered mental status, history of COVID-19 positivity EXAM: CT HEAD WITHOUT CONTRAST TECHNIQUE: Contiguous axial images were obtained from the base of the skull through the vertex without intravenous contrast. COMPARISON:  12/23/2014 FINDINGS: Brain: No evidence of acute infarction, hemorrhage, hydrocephalus, extra-axial collection or mass lesion/mass effect. Generalized atrophic changes are noted. Mild chronic white matter ischemic changes  are seen as well. Vascular: No hyperdense vessel or unexpected calcification. Skull: Normal. Negative for fracture or focal lesion. Sinuses/Orbits: No acute finding. Other: None. IMPRESSION: Chronic atrophic and ischemic changes without acute abnormality. Electronically Signed   By: Inez Catalina M.D.   On: 06/10/2021 02:34   CT CHEST WO CONTRAST  Result Date: 06/10/2021 CLINICAL DATA:  Respiratory failure, history of COVID-19 infection EXAM: CT CHEST WITHOUT CONTRAST TECHNIQUE: Multidetector CT imaging of the chest was performed following the standard protocol without IV contrast. COMPARISON:  Chest x-ray from the previous day. FINDINGS: Cardiovascular: Limited due to the lack of IV contrast. Atherosclerotic calcifications are noted. No aneurysmal dilatation is seen. Cardiac enlargement is noted. Pacing device is again seen. Coronary calcifications are noted. No enlargement of the pulmonary artery is seen. Mediastinum/Nodes: Thoracic inlet is within normal limits with the exception of scattered hypodensities throughout the thyroid. No discrete nodule is seen. No significant enlargement is noted. No sizable hilar or mediastinal adenopathy is noted. The esophagus is within normal limits. Lungs/Pleura: Small right-sided pleural effusion is noted. Mild bibasilar atelectatic changes are seen. No focal confluent infiltrate is noted. Mosaic attenuation is noted consistent with air trapping. No significant edema is noted. No sizable parenchymal nodule is seen. Upper Abdomen: Visualized upper abdomen show cystic lesions within the spleen similar to that noted on prior CT and ultrasound examination consistent with cysts. Musculoskeletal: Degenerative changes of the thoracic spine are noted. No acute rib abnormality is noted. IMPRESSION: Small right-sided pleural effusion. Bibasilar atelectatic changes as well as changes of air trapping. No focal confluent infiltrate is seen. Scattered hypodensities are noted throughout the  thyroid. Due to the patient's age, no follow-up recommended unless clinically warranted (ref: J Am Coll Radiol. 2015 Feb;12(2): 143-50). Aortic Atherosclerosis (ICD10-I70.0). Electronically Signed   By: Inez Catalina M.D.   On: 06/10/2021 02:42     Subjective: She is alert, pleasantly confuse  Discharge Exam: Vitals:   06/14/21 0517 06/14/21 0749  BP: 138/83 122/72  Pulse:  78  Resp:  17  Temp:  97.7 F (36.5 C)  SpO2:  94%     General: Pt is alert, awake, not in acute distress Cardiovascular: RRR, S1/S2 +, no rubs, no gallops Respiratory: CTA bilaterally, no wheezing, no rhonchi Abdominal: Soft, NT, ND, bowel sounds + Extremities: no edema, no cyanosis    The results of significant diagnostics from this hospitalization (including imaging, microbiology, ancillary and laboratory) are listed below for reference.     Microbiology: Recent Results (from the past 240 hour(s))  Urine Culture     Status: Abnormal   Collection Time: 06/09/21 11:05 PM  Specimen: Urine, Clean Catch  Result Value Ref Range Status   Specimen Description URINE, CLEAN CATCH  Final   Special Requests   Final    NONE Performed at [facility], [address] [place], [place] [address]    Culture >=100,000 COLONIES/mL KLEBSIELLA ORNITHINOLYTICA (A)  Final   Report Status 06/12/2021 FINAL  Final   Organism ID, Bacteria KLEBSIELLA ORNITHINOLYTICA (A)  Final      Susceptibility   Klebsiella ornithinolytica - MIC*    AMPICILLIN RESISTANT Resistant     CEFAZOLIN <=4 SENSITIVE Sensitive     CEFEPIME <=0.12 SENSITIVE Sensitive     CEFTRIAXONE <=0.25 SENSITIVE Sensitive     CIPROFLOXACIN <=0.25 SENSITIVE Sensitive     GENTAMICIN <=1 SENSITIVE Sensitive     IMIPENEM <=0.25 SENSITIVE Sensitive     NITROFURANTOIN <=16 SENSITIVE Sensitive     TRIMETH/SULFA <=20 SENSITIVE Sensitive     AMPICILLIN/SULBACTAM 4 SENSITIVE Sensitive     PIP/TAZO <=4 SENSITIVE Sensitive     * >=100,000 COLONIES/mL  KLEBSIELLA ORNITHINOLYTICA  Resp Panel by RT-PCR (Flu A&B, Covid) Nasopharyngeal Swab     Status: Abnormal   Collection Time: 06/10/21 12:12 AM   Specimen: Nasopharyngeal Swab; Nasopharyngeal(NP) swabs in vial transport medium  Result Value Ref Range Status   SARS Coronavirus 2 by RT PCR POSITIVE (A) NEGATIVE Final    Comment: RESULT CALLED TO, READ BACK BY AND VERIFIED WITH: S ROWLAND,RN'@0123'$  06/10/21 MKELLY (NOTE) SARS-CoV-2 target nucleic acids are DETECTED.  The SARS-CoV-2 RNA is generally detectable in upper respiratory specimens during the acute phase of infection. Positive results are indicative of the presence of the identified virus, but do not rule out bacterial infection or co-infection with other pathogens not detected by the test. Clinical correlation with patient history and other diagnostic information is necessary to determine patient infection status. The expected result is Negative.  Fact Sheet for Patients: EntrepreneurPulse.com.au  Fact Sheet for Healthcare Providers: IncredibleEmployment.be  This test is not yet approved or cleared by the [place] FDA and  has been authorized for detection and/or diagnosis of SARS-CoV-2 by FDA under an Emergency Use Authorization (EUA).  This EUA will remain in effect (meaning this test can be  used) for the duration of  the COVID-19 declaration under Section 564(b)(1) of the Act, 21 U.S.C. section 360bbb-3(b)(1), unless the authorization is terminated or revoked sooner.     Influenza A by PCR NEGATIVE NEGATIVE Final   Influenza B by PCR NEGATIVE NEGATIVE Final    Comment: (NOTE) The Xpert Xpress SARS-CoV-2/FLU/RSV plus assay is intended as an aid in the diagnosis of influenza from Nasopharyngeal swab specimens and should not be used as a sole basis for treatment. Nasal washings and aspirates are unacceptable for Xpert Xpress SARS-CoV-2/FLU/RSV testing.  Fact Sheet for  Patients: EntrepreneurPulse.com.au  Fact Sheet for Healthcare Providers: IncredibleEmployment.be  This test is not yet approved or cleared by the [place] FDA and has been authorized for detection and/or diagnosis of SARS-CoV-2 by FDA under an Emergency Use Authorization (EUA). This EUA will remain in effect (meaning this test can be used) for the duration of the COVID-19 declaration under Section 564(b)(1) of the Act, 21 U.S.C. section 360bbb-3(b)(1), unless the authorization is terminated or revoked.  Performed at [facility], [place] [address], [place],  [address]      Labs: BNP (last 3 results) No results for input(s): BNP in the last 8760 hours. Basic Metabolic Panel: Recent Labs  Lab 06/11/21 0041  06/12/21 0147 06/12/21 0255 06/13/21 0253 06/14/21 0215  NA 137 132* 136 134* 134*  K 4.8 5.4* 4.8 4.5 4.8  CL 106 105 105 105 106  CO2 18* 19* 20* 23 19*  GLUCOSE 156* 179* 192* 194* 189*  BUN 39* 43* 44* 48* 52*  CREATININE 1.09* 0.97 0.93 1.16* 1.17*  CALCIUM 9.7 9.7 9.8 9.7 10.1   Liver Function Tests: Recent Labs  Lab 06/10/21 0415 06/11/21 0041 06/12/21 0147 06/13/21 0253 06/14/21 0215  AST 32 34 35 24 29  ALT '23 23 19 22 '$ 34  ALKPHOS 84 100 90 78 74  BILITOT 0.8 1.1 1.2 0.6 0.7  PROT 6.2* 6.4* 6.1* 5.8* 6.2*  ALBUMIN 3.0* 2.9* 2.8* 2.6* 2.9*   No results for input(s): LIPASE, AMYLASE in the last 168 hours. No results for input(s): AMMONIA in the last 168 hours. CBC: Recent Labs  Lab 06/09/21 1649 06/11/21 0630 06/12/21 0147 06/13/21 0253 06/14/21 0215  WBC 5.6 6.4 8.7 9.1 9.8  NEUTROABS 3.5  --   --   --   --   HGB 13.5 15.2* 15.4* 14.6 15.5*  HCT 42.1 46.9* 46.0 41.9 44.9  MCV 93.6 95.1 90.9 88.4 88.7  PLT 192 145* 168 184 200   Cardiac Enzymes: No results for input(s): CKTOTAL, CKMB, CKMBINDEX, TROPONINI in the last 168 hours. BNP: Invalid input(s): POCBNP CBG: Recent Labs  Lab  06/13/21 0742 06/13/21 1258 06/13/21 1610 06/13/21 2055 06/14/21 0752  GLUCAP 212* 219* 259* 255* 227*   D-Dimer Recent Labs    06/13/21 0253 06/14/21 0215  DDIMER >20.00* >20.00*   Hgb A1c No results for input(s): HGBA1C in the last 72 hours. Lipid Profile No results for input(s): CHOL, HDL, LDLCALC, TRIG, CHOLHDL, LDLDIRECT in the last 72 hours. Thyroid function studies No results for input(s): TSH, T4TOTAL, T3FREE, THYROIDAB in the last 72 hours.  Invalid input(s): FREET3 Anemia work up Recent Labs    06/13/21 0253 06/14/21 0215  FERRITIN 298 301   Urinalysis    Component Value Date/Time   COLORURINE AMBER (A) 06/09/2021 2305   APPEARANCEUR CLOUDY (A) 06/09/2021 2305   LABSPEC 1.013 06/09/2021 2305   PHURINE 5.0 06/09/2021 2305   GLUCOSEU NEGATIVE 06/09/2021 2305   GLUCOSEU NEGATIVE 10/19/2019 1540   HGBUR NEGATIVE 06/09/2021 2305   BILIRUBINUR NEGATIVE 06/09/2021 2305   BILIRUBINUR negative 03/09/2020 1138   [place] 06/09/2021 2305   PROTEINUR 30 (A) 06/09/2021 2305   UROBILINOGEN negative (A) 03/09/2020 1138   UROBILINOGEN 0.2 10/19/2019 1540   NITRITE POSITIVE (A) 06/09/2021 2305   LEUKOCYTESUR LARGE (A) 06/09/2021 2305   Sepsis Labs Invalid input(s): PROCALCITONIN,  WBC,  LACTICIDVEN Microbiology Recent Results (from the past 240 hour(s))  Urine Culture     Status: Abnormal   Collection Time: 06/09/21 11:05 PM   Specimen: Urine, Clean Catch  Result Value Ref Range Status   Specimen Description URINE, CLEAN CATCH  Final   Special Requests   Final    NONE Performed at [facility], [place] [address], [place], [place] [address]    Culture >=100,000 COLONIES/mL KLEBSIELLA ORNITHINOLYTICA (A)  Final   Report Status 06/12/2021 FINAL  Final   Organism ID, Bacteria KLEBSIELLA ORNITHINOLYTICA (A)  Final      Susceptibility   Klebsiella ornithinolytica - MIC*    AMPICILLIN RESISTANT Resistant     CEFAZOLIN <=4 SENSITIVE Sensitive      CEFEPIME <=0.12 SENSITIVE Sensitive     CEFTRIAXONE <=0.25 SENSITIVE Sensitive  CIPROFLOXACIN <=0.25 SENSITIVE Sensitive     GENTAMICIN <=1 SENSITIVE Sensitive     IMIPENEM <=0.25 SENSITIVE Sensitive     NITROFURANTOIN <=16 SENSITIVE Sensitive     TRIMETH/SULFA <=20 SENSITIVE Sensitive     AMPICILLIN/SULBACTAM 4 SENSITIVE Sensitive     PIP/TAZO <=4 SENSITIVE Sensitive     * >=100,000 COLONIES/mL KLEBSIELLA ORNITHINOLYTICA  Resp Panel by RT-PCR (Flu A&B, Covid) Nasopharyngeal Swab     Status: Abnormal   Collection Time: 06/10/21 12:12 AM   Specimen: Nasopharyngeal Swab; Nasopharyngeal(NP) swabs in vial transport medium  Result Value Ref Range Status   SARS Coronavirus 2 by RT PCR POSITIVE (A) NEGATIVE Final    Comment: RESULT CALLED TO, READ BACK BY AND VERIFIED WITH: S ROWLAND,RN'@0123'$  06/10/21 MKELLY (NOTE) SARS-CoV-2 target nucleic acids are DETECTED.  The SARS-CoV-2 RNA is generally detectable in upper respiratory specimens during the acute phase of infection. Positive results are indicative of the presence of the identified virus, but do not rule out bacterial infection or co-infection with other pathogens not detected by the test. Clinical correlation with patient history and other diagnostic information is necessary to determine patient infection status. The expected result is Negative.  Fact Sheet for Patients: EntrepreneurPulse.com.au  Fact Sheet for Healthcare Providers: IncredibleEmployment.be  This test is not yet approved or cleared by the [place] FDA and  has been authorized for detection and/or diagnosis of SARS-CoV-2 by FDA under an Emergency Use Authorization (EUA).  This EUA will remain in effect (meaning this test can be  used) for the duration of  the COVID-19 declaration under Section 564(b)(1) of the Act, 21 U.S.C. section 360bbb-3(b)(1), unless the authorization is terminated or revoked sooner.     Influenza A  by PCR NEGATIVE NEGATIVE Final   Influenza B by PCR NEGATIVE NEGATIVE Final    Comment: (NOTE) The Xpert Xpress SARS-CoV-2/FLU/RSV plus assay is intended as an aid in the diagnosis of influenza from Nasopharyngeal swab specimens and should not be used as a sole basis for treatment. Nasal washings and aspirates are unacceptable for Xpert Xpress SARS-CoV-2/FLU/RSV testing.  Fact Sheet for Patients: EntrepreneurPulse.com.au  Fact Sheet for Healthcare Providers: IncredibleEmployment.be  This test is not yet approved or cleared by the [place] FDA and has been authorized for detection and/or diagnosis of SARS-CoV-2 by FDA under an Emergency Use Authorization (EUA). This EUA will remain in effect (meaning this test can be used) for the duration of the COVID-19 declaration under Section 564(b)(1) of the Act, 21 U.S.C. section 360bbb-3(b)(1), unless the authorization is terminated or revoked.  Performed at [facility], [place] [address], [place], [place] [address]      Time coordinating discharge: 40 minutes  SIGNED:   Elmarie Shiley, MD  [facility]

## 2021-06-14 NOTE — TOC Transition Note (Signed)
Transition of Care [facility]) - CM/SW Discharge Note   Patient Details  Name: Jessica [name] MRN: [id number] Date of Birth: 1927-06-23  Transition of Care [facility]) CM/SW Contact:  Coralee Pesa, [place] Phone Number: 06/14/2021, 11:11 AM   Clinical Narrative:    Pt to be transported to [facility] via [place]. Nurse to call report to [phone]   Final next level of care: Skilled Nursing Facility Barriers to Discharge: Barriers Resolved   Patient Goals and CMS Choice     Choice offered to / list presented to : Patient, Adult Children (Daughter)  Discharge Placement              Patient chooses bed at: [facility] Patient to be transferred to facility by: [place] Name of family member notified: [name] Patient and family notified of of transfer: 06/14/21  Discharge Plan and Services In-house Referral: Clinical Social Work Discharge Planning Services: CM Consult Post Acute Care Choice: [employer]                               Social Determinants of Health (SDOH) Interventions     Readmission Risk Interventions No flowsheet data found.

## 2021-06-17 ENCOUNTER — Non-Acute Institutional Stay (SKILLED_NURSING_FACILITY): Payer: Medicare Other | Admitting: Adult Health

## 2021-06-17 ENCOUNTER — Encounter: Payer: Self-pay | Admitting: Adult Health

## 2021-06-17 ENCOUNTER — Telehealth: Payer: Self-pay | Admitting: Cardiology

## 2021-06-17 DIAGNOSIS — U071 COVID-19: Secondary | ICD-10-CM | POA: Diagnosis not present

## 2021-06-17 DIAGNOSIS — N3000 Acute cystitis without hematuria: Secondary | ICD-10-CM

## 2021-06-17 DIAGNOSIS — I2510 Atherosclerotic heart disease of native coronary artery without angina pectoris: Secondary | ICD-10-CM | POA: Diagnosis not present

## 2021-06-17 DIAGNOSIS — I10 Essential (primary) hypertension: Secondary | ICD-10-CM

## 2021-06-17 DIAGNOSIS — N179 Acute kidney failure, unspecified: Secondary | ICD-10-CM | POA: Diagnosis not present

## 2021-06-17 DIAGNOSIS — N1832 Chronic kidney disease, stage 3b: Secondary | ICD-10-CM

## 2021-06-17 DIAGNOSIS — I5042 Chronic combined systolic (congestive) and diastolic (congestive) heart failure: Secondary | ICD-10-CM

## 2021-06-17 DIAGNOSIS — E1140 Type 2 diabetes mellitus with diabetic neuropathy, unspecified: Secondary | ICD-10-CM

## 2021-06-17 DIAGNOSIS — J9601 Acute respiratory failure with hypoxia: Secondary | ICD-10-CM

## 2021-06-17 NOTE — Telephone Encounter (Signed)
Will send this information to Dr. Jacolyn Reedy in-basket as an Juluis Rainier, for covering Cardiologist to review.  No action needed at this time.

## 2021-06-17 NOTE — Progress Notes (Signed)
Location:  [facility] Room Number: [place] of Service:  SNF ([id number]) Provider:  Durenda Age, DNP, FNP-BC  Patient Care Team: Binnie Rail, MD as PCP - General (Internal Medicine) [name], MD (Inactive) as PCP - Cardiology (Cardiology) Thompson Grayer, MD as Consulting Physician (Clinical Cardiac Electrophysiology)  Extended Emergency Contact Information Primary Emergency Contact: Bernadene Person, [place] Phone: [phone] Relation: Daughter  Code Status:  FULL CODE  Goals of care: Advanced Directive information Advanced Directives 06/17/2021  Does Patient Have a Medical Advance Directive? Yes  Type of Advance Directive [place]  Does patient want to make changes to medical advance directive? -  Copy of [place] in Chart? Yes - validated most recent copy scanned in chart (See row information)  Would patient like information on creating a medical advance directive? -  Pre-existing out of facility DNR order (yellow form or pink MOST form) -     Chief Complaint  Patient presents with   Hospitalization Follow-up    Hospital follow up.    HPI:  Pt is a 87 y.o. female who was admitted to [facility] on 06/14/21 post hospital admission 06/09/21 to 06/14/2021.  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  She presented to the ED via EMS for evaluation of malaise and low-grade fever.  She had a positive home COVID test day before.  She is unvaccinated.  O2 sat was 90% on room air and improved to 96% with 2 L O2.  She was treated with remdesivir 5/5 and Solu-Medrol for 4 days and discharged on  prednisone for 3 days.  She presented with a creatinine 1.6, baseline creatinine was 1.1.  ACE was held.  She presented to ED with urinary frequency and urine culture grew 100 K Klebsiella which was treated with ceftriaxone for 3 days.  She was seen in her room today and was wanting to sit up. She knows she had COVID-19 infection. No SOB noted.   Past Medical History:  Diagnosis Date   Breast cancer ([place])    CAD (coronary artery disease)    a. s/p DESx2 to mid and distal RCA in 2010.   Cholelithiasis 09/12/2013   Lap Chole on 09/14/13    Chronic combined systolic and diastolic CHF (congestive heart failure) ([place])    a. Echo 06/09/15: EF 55-60% -> 10/2017 EF 30-35% (pt and daughter did not wish to proceed with ischemic assessment - conservative rx).   CKD (chronic kidney disease), stage III (HCC)    Diabetes mellitus    NON INSULIN DEPENDENT   DJD (degenerative joint disease)    GERD (gastroesophageal reflux disease)    Hemorrhoids    History of diabetic neuropathy    Hypercholesterolemia    Hypertension    Hypertensive heart disease    Permanent atrial fibrillation ([place])    a. discovered on PPM interrogation 12/15- > eventually progressed to 100% atrial fib burden/permanent atrial fib.   SSS (sick sinus syndrome) (HCC)    a. s/p Medtronic PPM by JA for SSS and syncope 9/11.   Past Surgical History:  Procedure Laterality Date   CARDIAC CATHETERIZATION  07/05/2009  EF 55-60%   CARDIOVERSION N/A 06/11/2015   Procedure: CARDIOVERSION;  Surgeon: [name], MD;  Location: [facility];  Service: Cardiovascular;  Laterality: N/A;   CHOLECYSTECTOMY N/A 09/14/2013   Procedure: LAPAROSCOPIC CHOLECYSTECTOMY WITH INTRAOPERATIVE CHOLANGIOGRAM;  Surgeon: [name], MD;  Location: [facility];  Service: General;  Laterality: N/A;   COLONOSCOPY     ERCP N/A 09/13/2013   Procedure: ENDOSCOPIC RETROGRADE CHOLANGIOPANCREATOGRAPHY (ERCP);  Surgeon: [name], MD;  Location: [facility] ENDOSCOPY;   Service: Endoscopy;  Laterality: N/A;   INSERT / REPLACE / REMOVE PACEMAKER  9/11   SSS and syncope, implanted by JA (MDT)   MASTECTOMY  1992   BILATERAL WITH RECONSTRUCTION    Allergies  Allergen Reactions   Lyrica [Pregabalin] Swelling    Caused weight gain   Amlodipine Swelling   Sulfonamide Derivatives Rash    Outpatient Encounter Medications as of 06/17/2021  Medication Sig   acetaminophen (TYLENOL) 325 MG tablet Take 650 mg by mouth every 6 (six) hours as needed for fever or mild pain.   apixaban (ELIQUIS) 5 MG TABS tablet Take 1 tablet by mouth twice daily   b complex vitamins tablet Take 1 tablet by mouth daily.   bisacodyl (DULCOLAX) 10 MG suppository Place 10 mg rectally as needed for moderate constipation.   carvedilol (COREG) 25 MG tablet Take 1 tablet by mouth twice daily   docusate sodium (COLACE) 100 MG capsule Take 100 mg by mouth at bedtime.   fexofenadine (ALLEGRA) 180 MG tablet Take 180 mg by mouth daily as needed for allergies.   fish oil-omega-3 fatty acids 1000 MG capsule Take 1 g by mouth every morning.   furosemide (LASIX) 20 MG tablet Take 1 tablet (20 mg total) by mouth daily. TAKE 2 TABLETS BY MOUTH IN THE MORNING, AND  1 TABLET IN THE AFTERNOON   gabapentin (NEURONTIN) 300 MG capsule Take 1 capsule (300 mg total) by mouth 2 (two) times daily.   gabapentin (NEURONTIN) 400 MG capsule Take 1 capsule (400 mg total) by mouth at bedtime.   hydrALAZINE (APRESOLINE) 25 MG tablet Take 1 tablet (25 mg total) by mouth every 8 (eight) hours.   linagliptin (TRADJENTA) 5 MG TABS tablet Take 1 tablet (5 mg total) by mouth daily.   Magnesium Hydroxide (MILK OF MAGNESIA PO) Take 30 mLs by mouth as needed.   Misc. Devices (ROLLATOR ULTRA-LIGHT) MISC With seat.  Diabetic neuropathy, knee arthritis   nitroGLYCERIN (NITROSTAT) 0.4 MG SL tablet Place 1 tablet (0.4 mg total) under the tongue every 5 (five) minutes as needed for chest pain. x3 doses as needed for chest pain    Potassium Chloride Crys ER (KLOR-CON M20 PO) Take 1 tablet by mouth daily.   predniSONE (DELTASONE) 10 MG tablet Take 2 tablets (20 mg total) by mouth daily for 3 days.   Sodium Phosphates (RA SALINE ENEMA RE) Place 1 Dose rectally as needed.   No facility-administered encounter medications on file as of 06/17/2021.    Review of Systems  GENERAL: No change in appetite, no fatigue, no weight changes, no fever or chills  MOUTH and THROAT: Denies oral discomfort, gingival pain or bleeding RESPIRATORY: no cough, SOB, DOE, wheezing, hemoptysis CARDIAC: No chest pain, edema or palpitations GI: No abdominal pain, diarrhea, constipation, heart burn, nausea or vomiting GU: Denies dysuria, frequency, hematuria or discharge NEUROLOGICAL: Denies dizziness, syncope, numbness, or headache PSYCHIATRIC: Denies feelings of depression or anxiety. No report of hallucinations, insomnia, paranoia, or agitation   Immunization  History  Administered Date(s) Administered   Fluad Quad(high Dose 65+) 10/19/2019, 10/09/2020   Influenza Whole 10/04/2011   Influenza, High Dose Seasonal PF 08/25/2017, 09/22/2018   Influenza,inj,Quad PF,6+ Mos 09/03/2016   Influenza-Unspecified 10/04/2014   Pneumococcal Conjugate-13 10/21/2016   Pneumococcal Polysaccharide-23 04/14/2018   Pertinent  Health Maintenance Due  Topic Date Due   DEXA SCAN  Never done   INFLUENZA VACCINE  06/03/2021   HEMOGLOBIN A1C  12/11/2021   OPHTHALMOLOGY EXAM  04/09/2022   FOOT EXAM  05/22/2022   PNA vac Low Risk Adult  Completed   Fall Risk  03/09/2020 04/13/2019 04/14/2018 04/29/2016  Falls in the past year? 0 0 No Yes  Number falls in past yr: 0 0 - 1  Injury with Fall? 0 - - Yes  Risk Factor Category  - - - High Fall Risk  Risk for fall due to : - - - History of fall(s);Impaired balance/gait;Impaired mobility  Follow up Falls evaluation completed - - -     Vitals:   06/17/21 1013  BP: (!) 153/99  Pulse: 73  Resp: 18  Temp: (!) 97  F (36.1 C)  Weight: 156 lb 6.4 oz (70.9 kg)  Height: '5\' 4"'$  (1.626 m)   Body mass index is 26.85 kg/m.  Physical Exam  GENERAL APPEARANCE: Well nourished. In no acute distress. Normal body habitus SKIN:  Skin is warm and dry.  MOUTH and THROAT: Lips are without lesions. Oral mucosa is moist and without lesions.  RESPIRATORY: Breathing is even & unlabored, BS CTAB CARDIAC: RRR, no murmur,no extra heart sounds, no edema GI: Abdomen soft, normal BS, no masses, no tenderness NEUROLOGICAL: There is no tremor. Speech is clear. Alert to self and place, disoriented to time PSYCHIATRIC:  Affect and behavior are appropriate  Labs reviewed: Recent Labs    06/12/21 0255 06/13/21 0253 06/14/21 0215  NA 136 134* 134*  K 4.8 4.5 4.8  CL 105 105 106  CO2 20* 23 19*  GLUCOSE 192* 194* 189*  BUN 44* 48* 52*  CREATININE 0.93 1.16* 1.17*  CALCIUM 9.8 9.7 10.1   Recent Labs    06/12/21 0147 06/13/21 0253 06/14/21 0215  AST 35 24 29  ALT 19 22 34  ALKPHOS 90 78 74  BILITOT 1.2 0.6 0.7  PROT 6.1* 5.8* 6.2*  ALBUMIN 2.8* 2.6* 2.9*   Recent Labs    06/09/21 1649 06/11/21 0630 06/12/21 0147 06/13/21 0253 06/14/21 0215  WBC 5.6   < > 8.7 9.1 9.8  NEUTROABS 3.5  --   --   --   --   HGB 13.5   < > 15.4* 14.6 15.5*  HCT 42.1   < > 46.0 41.9 44.9  MCV 93.6   < > 90.9 88.4 88.7  PLT 192   < > 168 184 200   < > = values in this interval not displayed.   Lab Results  Component Value Date   TSH 4.270 09/03/2020   Lab Results  Component Value Date   HGBA1C 6.8 (H) 06/10/2021   Lab Results  Component Value Date   CHOL 129 10/19/2019   HDL 41.60 10/19/2019   LDLCALC 73 10/19/2019   LDLDIRECT 152.0 10/10/2013   TRIG 75.0 10/19/2019   CHOLHDL 3 10/19/2019    Significant Diagnostic Results in last 30 days:  DG Chest 1 View  Result Date: 06/09/2021 CLINICAL DATA:  SOB (shortness of breath) R06.02 (ICD-10-CM) EXAM: CHEST  1 VIEW COMPARISON:  10/30/2017. FINDINGS: Similar  enlargement the cardiac silhouette. Calcific atherosclerosis of the aorta. Similar chronically prominent right paratracheal stripe, likely vascular. Similar left subclavian approach dual lead cardiac rhythm maintenance device. Similar chronic prominence of the interstitial markings. Similar linear/streaky opacity at the left lung base, likely atelectasis/scar. No consolidation. No visible pleural effusions or pneumothorax. Right axillary clips. IMPRESSION: Chronic changes without evidence of acute cardiopulmonary disease. Electronically Signed   By: Margaretha Sheffield MD   On: 06/09/2021 17:09   CT HEAD WO CONTRAST (5MM)  Result Date: 06/10/2021 CLINICAL DATA:  Altered mental status, history of COVID-19 positivity EXAM: CT HEAD WITHOUT CONTRAST TECHNIQUE: Contiguous axial images were obtained from the base of the skull through the vertex without intravenous contrast. COMPARISON:  12/23/2014 FINDINGS: Brain: No evidence of acute infarction, hemorrhage, hydrocephalus, extra-axial collection or mass lesion/mass effect. Generalized atrophic changes are noted. Mild chronic white matter ischemic changes are seen as well. Vascular: No hyperdense vessel or unexpected calcification. Skull: Normal. Negative for fracture or focal lesion. Sinuses/Orbits: No acute finding. Other: None. IMPRESSION: Chronic atrophic and ischemic changes without acute abnormality. Electronically Signed   By: Inez Catalina M.D.   On: 06/10/2021 02:34   CT CHEST WO CONTRAST  Result Date: 06/10/2021 CLINICAL DATA:  Respiratory failure, history of COVID-19 infection EXAM: CT CHEST WITHOUT CONTRAST TECHNIQUE: Multidetector CT imaging of the chest was performed following the standard protocol without IV contrast. COMPARISON:  Chest x-ray from the previous day. FINDINGS: Cardiovascular: Limited due to the lack of IV contrast. Atherosclerotic calcifications are noted. No aneurysmal dilatation is seen. Cardiac enlargement is noted. Pacing device is  again seen. Coronary calcifications are noted. No enlargement of the pulmonary artery is seen. Mediastinum/Nodes: Thoracic inlet is within normal limits with the exception of scattered hypodensities throughout the thyroid. No discrete nodule is seen. No significant enlargement is noted. No sizable hilar or mediastinal adenopathy is noted. The esophagus is within normal limits. Lungs/Pleura: Small right-sided pleural effusion is noted. Mild bibasilar atelectatic changes are seen. No focal confluent infiltrate is noted. Mosaic attenuation is noted consistent with air trapping. No significant edema is noted. No sizable parenchymal nodule is seen. Upper Abdomen: Visualized upper abdomen show cystic lesions within the spleen similar to that noted on prior CT and ultrasound examination consistent with cysts. Musculoskeletal: Degenerative changes of the thoracic spine are noted. No acute rib abnormality is noted. IMPRESSION: Small right-sided pleural effusion. Bibasilar atelectatic changes as well as changes of air trapping. No focal confluent infiltrate is seen. Scattered hypodensities are noted throughout the thyroid. Due to the patient's age, no follow-up recommended unless clinically warranted (ref: J Am Coll Radiol. 2015 Feb;12(2): 143-50). Aortic Atherosclerosis (ICD10-I70.0). Electronically Signed   By: Inez Catalina M.D.   On: 06/10/2021 02:42    Assessment/Plan  1. Acute hypoxemic respiratory failure due to COVID-19 ([place]) -   reated with remdesivir 5/5 and Solu-Medrol for 4 days and discharged on prednisone for 3 days  2. Acute renal failure superimposed on stage 3b chronic kidney disease, unspecified acute renal failure type (HCC) -   creatinine 1.17 and GFR 44 -   will monitor  3. Acute cystitis without hematuria -   urine culture grew 100 K Klebsiella which was treated with ceftriaxone for 3 days  4. Coronary artery disease involving native coronary artery of native heart without angina pectoris -   stable, continue, PRN NTG, carvedilol and Eliquis  5. Type 2 diabetes mellitus with diabetic neuropathy, without long-term current use of insulin (HCC) Lab Results  Component Value Date   HGBA1C 6.8 (H) 06/10/2021   -    Continue Tradjenta  -    CBG checks daily -    continue Neurontin  6. Primary hypertension -  stable, continue carvedilol  7. Chronic combined systolic and diastolic CHF (congestive heart failure) ([place]) -    stable, continue Lasix   Family/ staff Communication:   Discussed plan of care with resident and charge nurse.  Labs/tests ordered: BMP and CBC  Goals of care:   Short-term care   Durenda Age, DNP, MSN, FNP-BC [facility] [phone] (Monday-Friday 8:00 a.m. - 5:00 p.m.) [phone] (after hours)

## 2021-06-17 NOTE — Telephone Encounter (Signed)
FYI--Patient's daughter wanted to inform our clinical staff that the patient was recently admitted for 1 week with COVID. She states now she's at [facility]. She's aware Dr. Johney Frame is out of office right now, but if any questions a call may be returned to her at [phone].

## 2021-06-17 NOTE — Telephone Encounter (Signed)
[place]ann[place], [place] - 06/17/2021 11:59 AM Werner Lean, MD  Sent: Mon June 17, 2021  2:36 PM  To: Nuala Alpha, LPN          Message  We wish her a safe recovery.  Reviewed DC summary from 06/14/21.  No need to change cardiac medications that this time for COVID-19 therapy interactions.

## 2021-06-18 ENCOUNTER — Non-Acute Institutional Stay (SKILLED_NURSING_FACILITY): Payer: Medicare Other | Admitting: Internal Medicine

## 2021-06-18 ENCOUNTER — Encounter: Payer: Self-pay | Admitting: Internal Medicine

## 2021-06-18 DIAGNOSIS — N3000 Acute cystitis without hematuria: Secondary | ICD-10-CM

## 2021-06-18 DIAGNOSIS — N179 Acute kidney failure, unspecified: Secondary | ICD-10-CM | POA: Diagnosis not present

## 2021-06-18 DIAGNOSIS — E1140 Type 2 diabetes mellitus with diabetic neuropathy, unspecified: Secondary | ICD-10-CM

## 2021-06-18 DIAGNOSIS — J9601 Acute respiratory failure with hypoxia: Secondary | ICD-10-CM | POA: Diagnosis not present

## 2021-06-18 DIAGNOSIS — I4821 Permanent atrial fibrillation: Secondary | ICD-10-CM

## 2021-06-18 DIAGNOSIS — N1832 Chronic kidney disease, stage 3b: Secondary | ICD-10-CM

## 2021-06-18 LAB — BASIC METABOLIC PANEL
BUN: 39 — AB (ref 4–21)
CO2: 25 — AB (ref 13–22)
Chloride: 99 (ref 99–108)
Creatinine: 1.1 (ref <=1.1)
Glucose: 176
Potassium: 4.2 (ref 3.4–5.3)
Sodium: 136 — AB (ref 137–147)

## 2021-06-18 LAB — CBC AND DIFFERENTIAL
HCT: 43 (ref 36–46)
Hemoglobin: 14.3 (ref 12.0–16.0)
Platelets: 149 — AB (ref 150–399)
WBC: 12.8

## 2021-06-18 LAB — COMPREHENSIVE METABOLIC PANEL
Calcium: 9.4 (ref 8.7–10.7)
GFR calc Af Amer: 53.47
GFR calc non Af Amer: 46.13

## 2021-06-18 LAB — CBC: RBC: 4.82 (ref 3.87–5.11)

## 2021-06-18 NOTE — Assessment & Plan Note (Addendum)
Clinically rhythm was essentially regular. Rate controlled ; continue Eliquis.

## 2021-06-18 NOTE — Progress Notes (Signed)
NURSING HOME LOCATION:  [facility] ROOM NUMBER:  [id number]  CODE STATUS:  Full Code  PCP:  Billey Gosling MD  This is a comprehensive admission note to this SNFperformed on this date less than 30 days from date of admission. Included are preadmission medical/surgical history; reconciled medication list; family history; social history and comprehensive review of systems.  Corrections and additions to the records were documented. Comprehensive physical exam was also performed. Additionally a clinical summary was entered for each active diagnosis pertinent to this admission in the Problem List to enhance continuity of care.  HPI: Patient was hospitalized 8/7 - 06/14/2021 with COVID infection.  She presented via EMS with generalized weakness, malaise, low-grade fever. Home COVID testing was positive 1-day PTA.  The patient is unvaccinated.  At admission she was confused but did report cough and urinary frequency.  O2 sats were 90% on room air but subsequently decreased to 87% necessitating supplemental oxygen at 2 L/min. COVID PCR was verified as positive.  CT of the chest revealed a small right-sided pleural effusion and bibasilar atelectatic changes.  D-dimer was greater than 20 and was persistently in this range.  She was already on Eliquis for persistent A. fib.  C-reactive protein peaked at 3.0.  It subsequently dropped to 2.1. Patient received 5 days of remdesivir and Solu-Medrol for 4 days with 3 additional days of prednisone at discharge. AKI on CKD 3b was present but resolved with fluid resuscitation.  At presentation creatinine was 1.6 with a baseline of 1.1.  This was felt to be related to poor fluid intake due to COVID.  ACE was held. UA revealed many bacteria with 21-50 white blood cells.  Culture grew Klebsiella Ornithinolytica for which she received ceftriaxone x3 days.  Past medical and surgical history: Includes history of CAD/post PCI; history of combined systolic  and diastolic congestive heart failure; permanent A. fib; diabetes with peripheral neuropathy; essential hypertension; history of breast cancer; GERD; and dyslipidemia. Other surgeries and procedures include cardioversion, cholecystectomy, colonoscopy, ERCP, prior history of pacemaker placement, and mastectomy.  Social history: Nondrinker; never smoked.  Family history: Noncontributory due to advanced age   Review of systems: Could not be completed as she slept through the exam.  She did not even arouse when I palpated her abdomen.  PT/OT stated that she was cooperative during the session earlier this morning but seemed exhausted after walking up to 15 feet.  Physical exam:  Pertinent or positive findings: She appears her stated age.  As noted she was soundly asleep and could not be aroused.  I did not perform any noxious stimuli to awaken her.  Mouth is agape and she exhibits hypopnea without frank apnea.  She is edentulous.  Rhythm is essentially regular with increased second heart sound.  She has minor rales of the anterior chest.  Breath sounds are decreased inferiorly.  Abdomen is protuberant and somewhat doughy. The dorsalis pedis pulses are amazingly strong.  Posterior tibial pulses are decreased.  She has slight irregular erythema over the face and the malar areas and the left cheek.  She has scattered bruising over the forearms.  There is irregular hyperpigmentation of the distal left lower extremity.  General appearance:  no acute distress, increased work of breathing is present.   Lymphatic: No lymphadenopathy about the head, neck, axilla. Eyes: No conjunctival inflammation or lid edema is present. There is no scleral icterus. Ears:  External ear exam shows no significant lesions or deformities.  Nose:  External nasal examination shows no deformity or inflammation. Nasal mucosa are pink and moist without lesions, exudates Oral exam: There is no oropharyngeal erythema or exudate. Neck:   No thyromegaly, masses, tenderness noted.    Heart:  No gallop, murmur, click, rub.  Lungs:  without wheezes, rhonchi, rubs. Abdomen: Bowel sounds are normal.  Abdomen is soft and nontender with no organomegaly, hernias, masses. GU: Deferred  Extremities:  No cyanosis, clubbing, edema. Neurologic exam:  Balance, Rhomberg, finger to nose testing could not be completed due to clinical state Skin: Warm & dry w/o tenting.  See clinical summary under each active problem in the Problem List with associated updated therapeutic plan

## 2021-06-18 NOTE — Assessment & Plan Note (Signed)
S/P Ceftriaxone X 3 d for Klebsiella Ornithinolytica

## 2021-06-18 NOTE — Patient Instructions (Signed)
See assessment and plan under each diagnosis in the problem list and acutely for this visit 

## 2021-06-18 NOTE — Assessment & Plan Note (Signed)
DM with neuro , renal, & vascular complications Glucose range as IP @ SNF: 118-194 Current A1c: 6.8% A1c goal : < 8% No hypoglycemia Gabapentin dose decreased to 300 mg tid due to GFR 44

## 2021-06-18 NOTE — Assessment & Plan Note (Signed)
Creatinine peaked @ 1.61 / GFR 30  nadir ; CKD Stage low 3b Current creatinine 1.17 / GFR 44  ; CKD high Stage 3b Medication List reviewed; Gabapentin decreased to 300 mg tid.

## 2021-06-19 NOTE — Assessment & Plan Note (Signed)
O2 sats 91% on RA; minor rales on exam. No significant compromise suggested.

## 2021-06-20 ENCOUNTER — Telehealth: Payer: Self-pay

## 2021-06-20 NOTE — Telephone Encounter (Signed)
Spoke with daughter today. She has been informed that patient is out of window to start anti-viral meds.  She was going to have someone from facility  call us or fax something over for patient to be given something for her cough.

## 2021-06-20 NOTE — Telephone Encounter (Signed)
Patient is at [facility] and is positive for Covid. The center is only isolating her. Daughter Rosanne Ashing is requesting medication (antibotic) for the patient.   Patient has been testing positive since Aug 6. She is very congested and states cold.

## 2021-06-21 ENCOUNTER — Encounter: Payer: Self-pay | Admitting: Adult Health

## 2021-06-21 ENCOUNTER — Non-Acute Institutional Stay (SKILLED_NURSING_FACILITY): Payer: Medicare Other | Admitting: Adult Health

## 2021-06-21 DIAGNOSIS — J9601 Acute respiratory failure with hypoxia: Secondary | ICD-10-CM | POA: Diagnosis not present

## 2021-06-21 DIAGNOSIS — N1832 Chronic kidney disease, stage 3b: Secondary | ICD-10-CM

## 2021-06-21 DIAGNOSIS — J1282 Pneumonia due to coronavirus disease 2019: Secondary | ICD-10-CM | POA: Diagnosis not present

## 2021-06-21 DIAGNOSIS — U071 COVID-19: Secondary | ICD-10-CM

## 2021-06-21 DIAGNOSIS — N179 Acute kidney failure, unspecified: Secondary | ICD-10-CM

## 2021-06-21 NOTE — Progress Notes (Signed)
Location:  [facility] Room Number: 310-B Place of Service:  SNF (31) Provider:  Durenda Age, DNP, FNP-BC  Patient Care Team: Binnie Rail, MD as PCP - General (Internal Medicine) [name], MD (Inactive) as PCP - Cardiology (Cardiology) Thompson Grayer, MD as Consulting Physician (Clinical Cardiac Electrophysiology)  Extended Emergency Contact Information Primary Emergency Contact: Bernadene Person, [place] Phone: [phone] Relation: Daughter  Code Status: Full code   Goals of care: Advanced Directive information Advanced Directives 06/21/2021  Does Patient Have a Medical Advance Directive? No  Type of Advance Directive [place]  Does patient want to make changes to medical advance directive? -  Copy of [place] in Chart? Yes - validated most recent copy scanned in chart (See row information)  Would patient like information on creating a medical advance directive? -  Pre-existing out of facility DNR order (yellow form or pink MOST form) -     Chief Complaint  Patient presents with   Acute Visit    Low O2 sat    HPI:  Pt is a 87 y.o. female who was reported to have had O2 sat dropped to 88% while on room air. She was started on O2 @ 2L/min. And O2 sat got up to 96%.  Chest x-ray revealed mild atelectatic changes/pneumonia at the right upper lobe.  Latest labs showed:  wbc 13.5 9up from 12.8 06/18/21), creatinine 1.05 and GFR 46.13. She was reported to have not eaten breakfast. . She was seen in her room with her eyes closed and moaning. She denies having pain. She is currently having short-term rehabilitation post hospitalization due to COVID-19 infection. She was treated with remdesivir 5/5 and Solu-Medrol for 4 days and was discharged on prednisone x3 days.   Past Medical History:  Diagnosis Date   Breast cancer ([place])    CAD (coronary artery  disease)    a. s/p DESx2 to mid and distal RCA in 2010.   Cholelithiasis 09/12/2013   Lap Chole on 09/14/13    Chronic combined systolic and diastolic CHF (congestive heart failure) ([place])    a. Echo 06/09/15: EF 55-60% -> 10/2017 EF 30-35% (pt and daughter did not wish to proceed with ischemic assessment - conservative rx).   CKD (chronic kidney disease), stage III (HCC)    Diabetes mellitus    NON INSULIN DEPENDENT   DJD (degenerative joint disease)    GERD (gastroesophageal reflux disease)    Hemorrhoids    History of diabetic neuropathy    Hypercholesterolemia    Hypertension    Hypertensive heart disease    Permanent atrial fibrillation ([place])    a. discovered on PPM interrogation 12/15- > eventually progressed to 100% atrial fib burden/permanent atrial fib.   SSS (sick sinus syndrome) (HCC)    a. s/p [employer] PPM by JA for SSS and syncope 9/11.   Past Surgical History:  Procedure Laterality Date   CARDIAC CATHETERIZATION  07/05/2009   EF 55-60%   CARDIOVERSION N/A 06/11/2015   Procedure: CARDIOVERSION;  Surgeon: [name], MD;  Location: [facility];  Service: Cardiovascular;  Laterality: N/A;   CHOLECYSTECTOMY N/A 09/14/2013   Procedure: LAPAROSCOPIC CHOLECYSTECTOMY WITH INTRAOPERATIVE CHOLANGIOGRAM;  Surgeon: [name], MD;  Location: [facility];  Service: General;  Laterality: N/A;   COLONOSCOPY     ERCP N/A 09/13/2013   Procedure: ENDOSCOPIC RETROGRADE CHOLANGIOPANCREATOGRAPHY (ERCP);  Surgeon: [name], MD;  Location[facility] ENDOSCOPY;  Service: Endoscopy;  Laterality: N/A;   INSERT / REPLACE / REMOVE PACEMAKER  9/11   SSS and syncope, implanted by JA (MDT)   MASTECTOMY  1992   BILATERAL WITH RECONSTRUCTION    Allergies  Allergen Reactions   Lyrica [Pregabalin] Swelling    Caused weight gain   Amlodipine Swelling   Sulfonamide Derivatives Rash    Outpatient Encounter Medications as of 06/21/2021  Medication Sig   acetaminophen (TYLENOL) 325 MG tablet  Take 650 mg by mouth every 6 (six) hours as needed for fever or mild pain.   apixaban (ELIQUIS) 5 MG TABS tablet Take 1 tablet by mouth twice daily   b complex vitamins tablet Take 1 tablet by mouth daily.   bisacodyl (DULCOLAX) 10 MG suppository Place 10 mg rectally as needed for moderate constipation.   carvedilol (COREG) 25 MG tablet Take 1 tablet by mouth twice daily   docusate sodium (COLACE) 100 MG capsule Take 100 mg by mouth at bedtime.   fexofenadine (ALLEGRA) 180 MG tablet Take 180 mg by mouth daily as needed for allergies.   fish oil-omega-3 fatty acids 1000 MG capsule Take 1 g by mouth every morning.   furosemide (LASIX) 20 MG tablet Take 1 tablet (20 mg total) by mouth daily. TAKE 2 TABLETS BY MOUTH IN THE MORNING, AND  1 TABLET IN THE AFTERNOON   gabapentin (NEURONTIN) 300 MG capsule Take 300 mg by mouth at bedtime.   gabapentin (NEURONTIN) 300 MG capsule Take 300 mg by mouth 3 (three) times daily.   guaiFENesin (ROBITUSSIN) 100 MG/5ML liquid Give 10 ml by mouth three times a day for 2 weeks   hydrALAZINE (APRESOLINE) 25 MG tablet Take 1 tablet (25 mg total) by mouth every 8 (eight) hours.   linagliptin (TRADJENTA) 5 MG TABS tablet Take 1 tablet (5 mg total) by mouth daily.   Magnesium Hydroxide (MILK OF MAGNESIA PO) Take 30 mLs by mouth as needed.   nitroGLYCERIN (NITROSTAT) 0.4 MG SL tablet Place 1 tablet (0.4 mg total) under the tongue every 5 (five) minutes as needed for chest pain. x3 doses as needed for chest pain   NON FORMULARY Regular Diet   Potassium Chloride Crys ER (KLOR-CON M20 PO) Take 1 tablet by mouth daily.   Sodium Phosphates (RA SALINE ENEMA RE) Place 1 Dose rectally as needed.   [DISCONTINUED] gabapentin (NEURONTIN) 300 MG capsule Take 1 capsule (300 mg total) by mouth 2 (two) times daily. (Patient taking differently: Take 300 mg by mouth at bedtime.)   [DISCONTINUED] gabapentin (NEURONTIN) 400 MG capsule Take 1 capsule (400 mg total) by mouth at bedtime.    [DISCONTINUED] Misc. Devices (ROLLATOR ULTRA-LIGHT) MISC With seat.  Diabetic neuropathy, knee arthritis   No facility-administered encounter medications on file as of 06/21/2021.    Review of Systems  GENERAL: No fever or chills MOUTH and THROAT: Denies oral discomfort, gingival pain or bleeding RESPIRATORY: no cough, SOB, DOE, wheezing, hemoptysis CARDIAC: No chest pain, edema or palpitations GI: No abdominal pain, diarrhea, constipation, heart burn, nausea or vomiting GU: Denies dysuria, frequency, hematuria or discharge NEUROLOGICAL: Denies dizziness, syncope, numbness, or headache PSYCHIATRIC: Denies feelings of depression or anxiety. No report of hallucinations, insomnia, paranoia, or agitation   Immunization History  Administered Date(s) Administered   Fluad Quad(high Dose 65+) 10/19/2019, 10/09/2020   Influenza Whole 10/04/2011   Influenza, High Dose Seasonal PF 08/25/2017, 09/22/2018   Influenza,inj,Quad PF,6+ Mos 09/03/2016  Influenza-Unspecified 10/04/2014   Pneumococcal Conjugate-13 10/21/2016   Pneumococcal Polysaccharide-23 04/14/2018   Pertinent  Health Maintenance Due  Topic Date Due   DEXA SCAN  Never done   INFLUENZA VACCINE  06/03/2021   HEMOGLOBIN A1C  12/11/2021   OPHTHALMOLOGY EXAM  04/09/2022   FOOT EXAM  05/22/2022   PNA vac Low Risk Adult  Completed   Fall Risk  03/09/2020 04/13/2019 04/14/2018 04/29/2016  Falls in the past year? 0 0 No Yes  Number falls in past yr: 0 0 - 1  Injury with Fall? 0 - - Yes  Risk Factor Category  - - - High Fall Risk  Risk for fall due to : - - - History of fall(s);Impaired balance/gait;Impaired mobility  Follow up Falls evaluation completed - - -     Vitals:   06/21/21 1040  BP: 117/71  Pulse: 65  Resp: 20  Temp: (!) 97 F (36.1 C)  Weight: 149 lb 6.4 oz (67.8 kg)  Height: '5\' 4"'$  (1.626 m)   Body mass index is 25.64 kg/m.  Physical Exam  GENERAL APPEARANCE: Well nourished. In no acute distress. Normal body  habitus SKIN:  Skin is warm and dry.  MOUTH and THROAT: Lips are without lesions. Oral mucosa is moist and without lesions.  RESPIRATORY: Breathing is even & unlabored, BS CTAB CARDIAC: RRR, no murmur,no extra heart sounds, no edema GI: Abdomen soft, normal BS, no masses, no tenderness EXTREMITIES:  Able to move X 4 extremities NEUROLOGICAL: There is no tremor. Speech is clear. PSYCHIATRIC:  Affect and behavior are appropriate  Labs reviewed: Recent Labs    06/12/21 0255 06/13/21 0253 06/14/21 0215 06/18/21 0000  NA 136 134* 134* 136*  K 4.8 4.5 4.8 4.2  CL 105 105 106 99  CO2 20* 23 19* 25*  GLUCOSE 192* 194* 189*  --   BUN 44* 48* 52* 39*  CREATININE 0.93 1.16* 1.17* 1.1  CALCIUM 9.8 9.7 10.1 9.4   Recent Labs    06/12/21 0147 06/13/21 0253 06/14/21 0215  AST 35 24 29  ALT 19 22 34  ALKPHOS 90 78 74  BILITOT 1.2 0.6 0.7  PROT 6.1* 5.8* 6.2*  ALBUMIN 2.8* 2.6* 2.9*   Recent Labs    06/09/21 1649 06/11/21 0630 06/12/21 0147 06/13/21 0253 06/14/21 0215 06/18/21 0000  WBC 5.6   < > 8.7 9.1 9.8 12.8  NEUTROABS 3.5  --   --   --   --   --   HGB 13.5   < > 15.4* 14.6 15.5* 14.3  HCT 42.1   < > 46.0 41.9 44.9 43  MCV 93.6   < > 90.9 88.4 88.7  --   PLT 192   < > 168 184 200 149*   < > = values in this interval not displayed.   Lab Results  Component Value Date   TSH 4.270 09/03/2020   Lab Results  Component Value Date   HGBA1C 6.8 (H) 06/10/2021   Lab Results  Component Value Date   CHOL 129 10/19/2019   HDL 41.60 10/19/2019   LDLCALC 73 10/19/2019   LDLDIRECT 152.0 10/10/2013   TRIG 75.0 10/19/2019   CHOLHDL 3 10/19/2019    Significant Diagnostic Results in last 30 days:  DG Chest 1 View  Result Date: 06/09/2021 CLINICAL DATA:  SOB (shortness of breath) R06.02 (ICD-10-CM) EXAM: CHEST  1 VIEW COMPARISON:  10/30/2017. FINDINGS: Similar enlargement the cardiac silhouette. Calcific atherosclerosis of the aorta. Similar chronically prominent right  paratracheal stripe, likely vascular. Similar left subclavian approach dual lead cardiac rhythm maintenance device. Similar chronic prominence of the interstitial markings. Similar linear/streaky opacity at the left lung base, likely atelectasis/scar. No consolidation. No visible pleural effusions or pneumothorax. Right axillary clips. IMPRESSION: Chronic changes without evidence of acute cardiopulmonary disease. Electronically Signed   By: Margaretha Sheffield MD   On: 06/09/2021 17:09   CT HEAD WO CONTRAST (5MM)  Result Date: 06/10/2021 CLINICAL DATA:  Altered mental status, history of COVID-19 positivity EXAM: CT HEAD WITHOUT CONTRAST TECHNIQUE: Contiguous axial images were obtained from the base of the skull through the vertex without intravenous contrast. COMPARISON:  12/23/2014 FINDINGS: Brain: No evidence of acute infarction, hemorrhage, hydrocephalus, extra-axial collection or mass lesion/mass effect. Generalized atrophic changes are noted. Mild chronic white matter ischemic changes are seen as well. Vascular: No hyperdense vessel or unexpected calcification. Skull: Normal. Negative for fracture or focal lesion. Sinuses/Orbits: No acute finding. Other: None. IMPRESSION: Chronic atrophic and ischemic changes without acute abnormality. Electronically Signed   By: Inez Catalina M.D.   On: 06/10/2021 02:34   CT CHEST WO CONTRAST  Result Date: 06/10/2021 CLINICAL DATA:  Respiratory failure, history of COVID-19 infection EXAM: CT CHEST WITHOUT CONTRAST TECHNIQUE: Multidetector CT imaging of the chest was performed following the standard protocol without IV contrast. COMPARISON:  Chest x-ray from the previous day. FINDINGS: Cardiovascular: Limited due to the lack of IV contrast. Atherosclerotic calcifications are noted. No aneurysmal dilatation is seen. Cardiac enlargement is noted. Pacing device is again seen. Coronary calcifications are noted. No enlargement of the pulmonary artery is seen. Mediastinum/Nodes:  Thoracic inlet is within normal limits with the exception of scattered hypodensities throughout the thyroid. No discrete nodule is seen. No significant enlargement is noted. No sizable hilar or mediastinal adenopathy is noted. The esophagus is within normal limits. Lungs/Pleura: Small right-sided pleural effusion is noted. Mild bibasilar atelectatic changes are seen. No focal confluent infiltrate is noted. Mosaic attenuation is noted consistent with air trapping. No significant edema is noted. No sizable parenchymal nodule is seen. Upper Abdomen: Visualized upper abdomen show cystic lesions within the spleen similar to that noted on prior CT and ultrasound examination consistent with cysts. Musculoskeletal: Degenerative changes of the thoracic spine are noted. No acute rib abnormality is noted. IMPRESSION: Small right-sided pleural effusion. Bibasilar atelectatic changes as well as changes of air trapping. No focal confluent infiltrate is seen. Scattered hypodensities are noted throughout the thyroid. Due to the patient's age, no follow-up recommended unless clinically warranted (ref: J Am Coll Radiol. 2015 Feb;12(2): 143-50). Aortic Atherosclerosis (ICD10-I70.0). Electronically Signed   By: Inez Catalina M.D.   On: 06/10/2021 02:42    Assessment/Plan  1. Pneumonia due to COVID-19 virus -  chest x-ray pneumonia of the right upper lobe -   Will start ceftriaxone 1 g IM daily x7 days and Florastor 250 mg twice a day x10 days  2. Acute renal failure superimposed on stage 3b chronic kidney disease, unspecified acute renal failure type [facility]) Lab Results  Component Value Date   NA 136 (A) 06/18/2021   K 4.2 06/18/2021   CO2 25 (A) 06/18/2021   GLUCOSE 189 (H) 06/14/2021   BUN 39 (A) 06/18/2021   CREATININE 1.1 06/18/2021   CALCIUM 9.4 06/18/2021   GFRNONAA 46.13 06/18/2021   GFRAA 53.47 06/18/2021   -  improved  3. Acute hypoxemic respiratory failure (HCC) due to Covid- 19 -  thought to be due to  pneumonia -  continue O2 @  2L/min via [place]     Family/ staff Communication: Discussed plan of care with resident and charge nurse.  Labs/tests ordered: CBC and BMP  Goals of care:   Short-term care   Durenda Age, DNP, MSN, FNP-BC [facility] [phone] (Monday-Friday 8:00 a.m. - 5:00 p.m.) [phone] (after hours)

## 2021-06-26 ENCOUNTER — Non-Acute Institutional Stay (SKILLED_NURSING_FACILITY): Payer: Medicare Other | Admitting: Adult Health

## 2021-06-26 DIAGNOSIS — I10 Essential (primary) hypertension: Secondary | ICD-10-CM

## 2021-06-26 DIAGNOSIS — I5042 Chronic combined systolic (congestive) and diastolic (congestive) heart failure: Secondary | ICD-10-CM

## 2021-06-26 DIAGNOSIS — E1140 Type 2 diabetes mellitus with diabetic neuropathy, unspecified: Secondary | ICD-10-CM | POA: Diagnosis not present

## 2021-06-26 DIAGNOSIS — U071 COVID-19: Secondary | ICD-10-CM | POA: Diagnosis not present

## 2021-06-26 DIAGNOSIS — J1282 Pneumonia due to coronavirus disease 2019: Secondary | ICD-10-CM

## 2021-06-26 NOTE — Progress Notes (Signed)
Location:  [place] Room Number: [place] of Service:  SNF ([phone]) Provider:  Durenda Age, DNP, FNP-BC  Patient Care Team: Binnie Rail, MD as PCP - General (Internal Medicine) [name], MD (Inactive) as PCP - Cardiology (Cardiology) Thompson Grayer, MD as Consulting Physician (Clinical Cardiac Electrophysiology)  Extended Emergency Contact Information Primary Emergency Contact: Bernadene Person, [place] Phone: [phone] Relation: Daughter  Code Status:  Full Code  Goals of care: Advanced Directive information Advanced Directives 06/28/2021  Does Patient Have a Medical Advance Directive? No  Type of Advance Directive [place]  Does patient want to make changes to medical advance directive? -  Copy of [place] in Chart? Yes - validated most recent copy scanned in chart (See row information)  Would patient like information on creating a medical advance directive? -  Pre-existing out of facility DNR order (yellow form or pink MOST form) -     Chief Complaint  Patient presents with   Acute Visit    Short-term Rehabilitation    HPI:  Pt is a 87 y.o. female seen today for short-term rehabilitation visit. She is currently having PT, ST and OT. SBPs ranging from 106 to 142. She takes Hydralazine 25 mg every 8 hours and Coreg 25 mg BID for hypertension. Ceftriaxone was started on 8/19 for pneumonia.She takes O2 @ 2L/min via [employer]. She takes Lasix 20 mg 2 tabs= 40 mg in the morning and 1 tab 20 mg in the afternoon.  CBGs ranging from 124 to 227. She takes Tradjenta 5 mg daily for diabetes mellitus.   Past Medical History:  Diagnosis Date   Breast cancer ([place])    CAD (coronary artery disease)    a. s/p DESx2 to mid and distal RCA in 2010.   Cholelithiasis 09/12/2013   Lap Chole on 09/14/13    Chronic combined systolic and diastolic CHF (congestive  heart failure) ([place])    a. Echo 06/09/15: EF 55-60% -> 10/2017 EF 30-35% (pt and daughter did not wish to proceed with ischemic assessment - conservative rx).   CKD (chronic kidney disease), stage III (HCC)    Diabetes mellitus    NON INSULIN DEPENDENT   DJD (degenerative joint disease)    GERD (gastroesophageal reflux disease)    Hemorrhoids    History of diabetic neuropathy    Hypercholesterolemia    Hypertension    Hypertensive heart disease    Permanent atrial fibrillation ([place])    a. discovered on PPM interrogation 12/15- > eventually progressed to 100% atrial fib burden/permanent atrial fib.   SSS (sick sinus syndrome) (HCC)    a. s/p Medtr[employer] PPM by JA for SSS and syncope 9/11.   Past Surgical History:  Procedure Laterality Date   CARDIAC CATHETERIZATION  07/05/2009   EF 55-60%   CARDIOVERSION N/A 06/11/2015   Procedure: CARDIOVERSION;  Surgeon: [name], MD;  Location: [facility];  Service: Cardiovascular;  Laterality: N/A;   CHOLECYSTECTOMY N/A 09/14/2013   Procedure: LAPAROSCOPIC CHOLECYSTECTOMY WITH INTRAOPERATIVE CHOLANGIOGRAM;  Surgeon: [name], MD;  Location: [facility];  Service: General;  Laterality: N/A;   COLONOSCOPY     ERCP N/A 09/13/2013   Procedure: ENDOSCOPIC RETROGRADE CHOLANGIOPANCREATOGRAPHY (ERCP);  Surgeon: [name], MD;  Location: [facility] ENDOSCOPY;  Service: Endoscopy;  Laterality: N/A;   INSERT / REPLACE / REMOVE PACEMAKER  9/11   SSS  and syncope, implanted by JA (MDT)   MASTECTOMY  1992   BILATERAL WITH RECONSTRUCTION    Allergies  Allergen Reactions   Lyrica [Pregabalin] Swelling    Caused weight gain   Amlodipine Swelling   Sulfonamide Derivatives Rash    Outpatient Encounter Medications as of 06/26/2021  Medication Sig   acetaminophen (TYLENOL) 325 MG tablet Take 650 mg by mouth every 6 (six) hours as needed for fever or mild pain.   apixaban (ELIQUIS) 5 MG TABS tablet Take 1 tablet by mouth twice daily   b complex vitamins  tablet Take 1 tablet by mouth daily.   bisacodyl (DULCOLAX) 10 MG suppository Place 10 mg rectally as needed for moderate constipation.   carvedilol (COREG) 25 MG tablet Take 1 tablet by mouth twice daily   docusate sodium (COLACE) 100 MG capsule Take 100 mg by mouth at bedtime.   fexofenadine (ALLEGRA) 180 MG tablet Take 180 mg by mouth daily as needed for allergies.   fish oil-omega-3 fatty acids 1000 MG capsule Take 1 g by mouth every morning.   furosemide (LASIX) 20 MG tablet Take 1 tablet (20 mg total) by mouth daily. TAKE 2 TABLETS BY MOUTH IN THE MORNING, AND  1 TABLET IN THE AFTERNOON   gabapentin (NEURONTIN) 300 MG capsule Take 300 mg by mouth at bedtime.   gabapentin (NEURONTIN) 300 MG capsule Take 300 mg by mouth 3 (three) times daily.   guaiFENesin (ROBITUSSIN) 100 MG/5ML liquid Give 10 ml by mouth three times a day for 2 weeks   hydrALAZINE (APRESOLINE) 25 MG tablet Take 1 tablet (25 mg total) by mouth every 8 (eight) hours.   linagliptin (TRADJENTA) 5 MG TABS tablet Take 1 tablet (5 mg total) by mouth daily.   Magnesium Hydroxide (MILK OF MAGNESIA PO) Take 30 mLs by mouth as needed.   nitroGLYCERIN (NITROSTAT) 0.4 MG SL tablet Place 1 tablet (0.4 mg total) under the tongue every 5 (five) minutes as needed for chest pain. x3 doses as needed for chest pain   NON FORMULARY Regular Diet   Potassium Chloride Crys ER (KLOR-CON M20 PO) Take 1 tablet by mouth daily.   Sodium Phosphates (RA SALINE ENEMA RE) Place 1 Dose rectally as needed.   No facility-administered encounter medications on file as of 06/26/2021.    Review of Systems  GENERAL: No change in appetite, no fever, chills or weakness MOUTH and THROAT: Denies oral discomfort, gingival pain or bleeding RESPIRATORY: +cough, no SOB, DOE, wheezing, hemoptysis CARDIAC: No chest pain, edema or palpitations GI: No abdominal pain, diarrhea, constipation, heart burn, nausea or vomiting GU: Denies dysuria, frequency, hematuria or  discharge NEUROLOGICAL: Denies dizziness, syncope, numbness, or headache PSYCHIATRIC: Denies feelings of depression or anxiety. No report of hallucinations, insomnia, paranoia, or agitation   Immunization History  Administered Date(s) Administered   Fluad Quad(high Dose 65+) 10/19/2019, 10/09/2020   Influenza Whole 10/04/2011   Influenza, High Dose Seasonal PF 08/25/2017, 09/22/2018   Influenza,inj,Quad PF,6+ Mos 09/03/2016   Influenza-Unspecified 10/04/2014   Pneumococcal Conjugate-13 10/21/2016   Pneumococcal Polysaccharide-23 04/14/2018   Pertinent  Health Maintenance Due  Topic Date Due   DEXA SCAN  Never done   INFLUENZA VACCINE  06/03/2021   HEMOGLOBIN A1C  12/11/2021   OPHTHALMOLOGY EXAM  04/09/2022   FOOT EXAM  05/22/2022   PNA vac Low Risk Adult  Completed   Fall Risk  03/09/2020 04/13/2019 04/14/2018 04/29/2016  Falls in the past year? 0 0 No Yes  Number falls in  past yr: 0 0 - 1  Injury with Fall? 0 - - Yes  Risk Factor Category  - - - High Fall Risk  Risk for fall due to : - - - History of fall(s);Impaired balance/gait;Impaired mobility  Follow up Falls evaluation completed - - -     Vitals:   06/26/21 1300  BP: 123/72  Pulse: 82  Resp: 20  Temp: (!) 97.3 F (36.3 C)  Weight: 151 lb 9.6 oz (68.8 kg)  Height: '5\' 4"'$  (1.626 m)   Body mass index is 26.02 kg/m.  Physical Exam  GENERAL APPEARANCE: Well nourished. In no acute distress. Normal body habitus SKIN:  Skin is warm and dry.  MOUTH and THROAT: Lips are without lesions. Oral mucosa is moist and without lesions.  RESPIRATORY: Breathing is even & unlabored, BS CTAB CARDIAC: RRR, no murmur,no extra heart sounds, no edema GI: Abdomen soft, normal BS, no masses, no tenderness NEUROLOGICAL: There is no tremor. Speech is clear. Alert to self and place, disoriented to time. PSYCHIATRIC:  Affect and behavior are appropriate  Labs reviewed: Recent Labs    06/12/21 0255 06/13/21 0253 06/14/21 0215  06/18/21 0000  NA 136 134* 134* 136*  K 4.8 4.5 4.8 4.2  CL 105 105 106 99  CO2 20* 23 19* 25*  GLUCOSE 192* 194* 189*  --   BUN 44* 48* 52* 39*  CREATININE 0.93 1.16* 1.17* 1.1  CALCIUM 9.8 9.7 10.1 9.4   Recent Labs    06/12/21 0147 06/13/21 0253 06/14/21 0215  AST 35 24 29  ALT 19 22 34  ALKPHOS 90 78 74  BILITOT 1.2 0.6 0.7  PROT 6.1* 5.8* 6.2*  ALBUMIN 2.8* 2.6* 2.9*   Recent Labs    06/09/21 1649 06/11/21 0630 06/12/21 0147 06/13/21 0253 06/14/21 0215 06/18/21 0000  WBC 5.6   < > 8.7 9.1 9.8 12.8  NEUTROABS 3.5  --   --   --   --   --   HGB 13.5   < > 15.4* 14.6 15.5* 14.3  HCT 42.1   < > 46.0 41.9 44.9 43  MCV 93.6   < > 90.9 88.4 88.7  --   PLT 192   < > 168 184 200 149*   < > = values in this interval not displayed.   Lab Results  Component Value Date   TSH 4.270 09/03/2020   Lab Results  Component Value Date   HGBA1C 6.8 (H) 06/10/2021   Lab Results  Component Value Date   CHOL 129 10/19/2019   HDL 41.60 10/19/2019   LDLCALC 73 10/19/2019   LDLDIRECT 152.0 10/10/2013   TRIG 75.0 10/19/2019   CHOLHDL 3 10/19/2019    Significant Diagnostic Results in last 30 days:  DG Chest 1 View  Result Date: 06/09/2021 CLINICAL DATA:  SOB (shortness of breath) R06.02 (ICD-10-CM) EXAM: CHEST  1 VIEW COMPARISON:  10/30/2017. FINDINGS: Similar enlargement the cardiac silhouette. Calcific atherosclerosis of the aorta. Similar chronically prominent right paratracheal stripe, likely vascular. Similar left subclavian approach dual lead cardiac rhythm maintenance device. Similar chronic prominence of the interstitial markings. Similar linear/streaky opacity at the left lung base, likely atelectasis/scar. No consolidation. No visible pleural effusions or pneumothorax. Right axillary clips. IMPRESSION: Chronic changes without evidence of acute cardiopulmonary disease. Electronically Signed   By: Margaretha Sheffield MD   On: 06/09/2021 17:09   CT HEAD WO CONTRAST  (5MM)  Result Date: 06/10/2021 CLINICAL DATA:  Altered mental status, history of COVID-19  positivity EXAM: CT HEAD WITHOUT CONTRAST TECHNIQUE: Contiguous axial images were obtained from the base of the skull through the vertex without intravenous contrast. COMPARISON:  12/23/2014 FINDINGS: Brain: No evidence of acute infarction, hemorrhage, hydrocephalus, extra-axial collection or mass lesion/mass effect. Generalized atrophic changes are noted. Mild chronic white matter ischemic changes are seen as well. Vascular: No hyperdense vessel or unexpected calcification. Skull: Normal. Negative for fracture or focal lesion. Sinuses/Orbits: No acute finding. Other: None. IMPRESSION: Chronic atrophic and ischemic changes without acute abnormality. Electronically Signed   By: Inez Catalina M.D.   On: 06/10/2021 02:34   CT CHEST WO CONTRAST  Result Date: 06/10/2021 CLINICAL DATA:  Respiratory failure, history of COVID-19 infection EXAM: CT CHEST WITHOUT CONTRAST TECHNIQUE: Multidetector CT imaging of the chest was performed following the standard protocol without IV contrast. COMPARISON:  Chest x-ray from the previous day. FINDINGS: Cardiovascular: Limited due to the lack of IV contrast. Atherosclerotic calcifications are noted. No aneurysmal dilatation is seen. Cardiac enlargement is noted. Pacing device is again seen. Coronary calcifications are noted. No enlargement of the pulmonary artery is seen. Mediastinum/Nodes: Thoracic inlet is within normal limits with the exception of scattered hypodensities throughout the thyroid. No discrete nodule is seen. No significant enlargement is noted. No sizable hilar or mediastinal adenopathy is noted. The esophagus is within normal limits. Lungs/Pleura: Small right-sided pleural effusion is noted. Mild bibasilar atelectatic changes are seen. No focal confluent infiltrate is noted. Mosaic attenuation is noted consistent with air trapping. No significant edema is noted. No sizable  parenchymal nodule is seen. Upper Abdomen: Visualized upper abdomen show cystic lesions within the spleen similar to that noted on prior CT and ultrasound examination consistent with cysts. Musculoskeletal: Degenerative changes of the thoracic spine are noted. No acute rib abnormality is noted. IMPRESSION: Small right-sided pleural effusion. Bibasilar atelectatic changes as well as changes of air trapping. No focal confluent infiltrate is seen. Scattered hypodensities are noted throughout the thyroid. Due to the patient's age, no follow-up recommended unless clinically warranted (ref: J Am Coll Radiol. 2015 Feb;12(2): 143-50). Aortic Atherosclerosis (ICD10-I70.0). Electronically Signed   By: Inez Catalina M.D.   On: 06/10/2021 02:42    Assessment/Plan  1. Primary hypertension -   BPs stable, continue Hydralazine and Coreg -  monitor BPs  2. Type 2 diabetes mellitus with diabetic neuropathy, without long-term current use of insulin (HCC) Lab Results  Component Value Date   HGBA1C 6.8 (H) 06/10/2021   -  CBGs stable, continue Tradjenta -  monitor CBGs  3. Pneumonia due to COVID-19 virus -  no SOB, continue Ceftriaxone and Guaifenesin  4. Chronic combined systolic and diastolic CHF (congestive heart failure) ([place]) -  stable, continue Lasix     Family/ staff Communication: Discussed plan of care with resident and charge nurse.  Labs/tests ordered:  None  Goals of care:   Short-term care   Durenda Age, DNP, MSN, FNP-BC [facility] [phone] (Monday-Friday 8:00 a.m. - 5:00 p.m.) [phone] (after hours)

## 2021-06-28 ENCOUNTER — Encounter: Payer: Self-pay | Admitting: Adult Health

## 2021-06-28 ENCOUNTER — Non-Acute Institutional Stay (SKILLED_NURSING_FACILITY): Payer: Medicare Other | Admitting: Adult Health

## 2021-06-28 DIAGNOSIS — U071 COVID-19: Secondary | ICD-10-CM

## 2021-06-28 DIAGNOSIS — R21 Rash and other nonspecific skin eruption: Secondary | ICD-10-CM | POA: Diagnosis not present

## 2021-06-28 DIAGNOSIS — I4821 Permanent atrial fibrillation: Secondary | ICD-10-CM

## 2021-06-28 DIAGNOSIS — J1282 Pneumonia due to coronavirus disease 2019: Secondary | ICD-10-CM

## 2021-06-28 NOTE — Progress Notes (Signed)
Location:  [place] Room Number: 310-B Place of Service:  SNF (31) Provider:  Durenda Age, DNP, FNP-BC  Patient Care Team: Binnie Rail, MD as PCP - General (Internal Medicine) [name], MD (Inactive) as PCP - Cardiology (Cardiology) Thompson Grayer, MD as Consulting Physician (Clinical Cardiac Electrophysiology)  Extended Emergency Contact Information Primary Emergency Contact: Bernadene Person, [place] Phone: [phone] Relation: Daughter  Code Status: Full code   Goals of care: Advanced Directive information Advanced Directives 07/04/2021  Does Patient Have a Medical Advance Directive? No  Type of Advance Directive [place]  Does patient want to make changes to medical advance directive? -  Copy of [place] in Chart? Yes - validated most recent copy scanned in chart (See row information)  Would patient like information on creating a medical advance directive? -  Pre-existing out of facility DNR order (yellow form or pink MOST form) -     Chief Complaint  Patient presents with   Acute Visit    Rashes    HPI:  Pt is a 87 y.o. female seen today for an acute visit regarding bilateral buttock rashes. She was noted to have red red rashes on bilateral sacral area. The rashes are flat. They are dry. Resident denies itching nor pain. No reported fever. Noted to have productive cough. She continues to take Ceftriaxone 1 gm IM daily for pneumonia. She takes Eliquis 5 mg BID and Coreg 25 mg BID for PAF. Daughter was in the room visiting.   Past Medical History:  Diagnosis Date   Breast cancer ([place])    CAD (coronary artery disease)    a. s/p DESx2 to mid and distal RCA in 2010.   Cholelithiasis 09/12/2013   Lap Chole on 09/14/13    Chronic combined systolic and diastolic CHF (congestive heart failure) ([place])    a. Echo 06/09/15: EF 55-60% -> 10/2017 EF  30-35% (pt and daughter did not wish to proceed with ischemic assessment - conservative rx).   CKD (chronic kidney disease), stage III (HCC)    Diabetes mellitus    NON INSULIN DEPENDENT   DJD (degenerative joint disease)    GERD (gastroesophageal reflux disease)    Hemorrhoids    History of diabetic neuropathy    Hypercholesterolemia    Hypertension    Hypertensive heart disease    Permanent atrial fibrillation ([place])    a. discovered on PPM interrogation 12/15- > eventually progressed to 100% atrial fib burden/permanent atrial fib.   SSS (sick sinus syndrome) (HCC)    a. s/p [employer] PPM by JA for SSS and syncope 9/11.   Past Surgical History:  Procedure Laterality Date   CARDIAC CATHETERIZATION  07/05/2009   EF 55-60%   CARDIOVERSION N/A 06/11/2015   Procedure: CARDIOVERSION;  Surgeon: [name], MD;  Location: [facility];  Service: Cardiovascular;  Laterality: N/A;   CHOLECYSTECTOMY N/A 09/14/2013   Procedure: LAPAROSCOPIC CHOLECYSTECTOMY WITH INTRAOPERATIVE CHOLANGIOGRAM;  Surgeon: [name], MD;  Location: [facility];  Service: General;  Laterality: N/A;   COLONOSCOPY     ERCP N/A 09/13/2013   Procedure: ENDOSCOPIC RETROGRADE CHOLANGIOPANCREATOGRAPHY (ERCP);  Surgeon: [name], MD;  [facility] ENDOSCOPY;  Service: Endoscopy;  Laterality: N/A;   INSERT / REPLACE / REMOVE PACEMAKER  9/11   SSS and syncope, implanted by JA (MDT)   [place]  WITH RECONSTRUCTION    Allergies  Allergen Reactions   Lyrica [Pregabalin] Swelling    Caused weight gain   Amlodipine Swelling   Sulfonamide Derivatives Rash    Outpatient Encounter Medications as of 06/28/2021  Medication Sig   acetaminophen (TYLENOL) 325 MG tablet Take 650 mg by mouth every 6 (six) hours as needed for fever or mild pain.   b complex vitamins tablet Take 1 tablet by mouth daily.   bisacodyl (DULCOLAX) 10 MG suppository Place 10 mg rectally as needed for moderate constipation.    docusate sodium (COLACE) 100 MG capsule Take 100 mg by mouth at bedtime.   fish oil-omega-3 fatty acids 1000 MG capsule Take 1 g by mouth every morning.   guaiFENesin (ROBITUSSIN) 100 MG/5ML liquid Give 10 ml by mouth three times a day for 2 weeks   Magnesium Hydroxide (MILK OF MAGNESIA PO) Take 30 mLs by mouth as needed.   NON FORMULARY Regular Diet   Sodium Phosphates (RA SALINE ENEMA RE) Place 1 Dose rectally as needed.   [DISCONTINUED] apixaban (ELIQUIS) 5 MG TABS tablet Take 1 tablet by mouth twice daily   [DISCONTINUED] carvedilol (COREG) 25 MG tablet Take 1 tablet by mouth twice daily   [DISCONTINUED] cefTRIAXone 1 g in dextrose 5 % 50 mL Inject 1 g into the vein daily. Give 1 gm IM daily x 7 days. dx: PNA COVID-19   [DISCONTINUED] fexofenadine (ALLEGRA) 180 MG tablet Take 180 mg by mouth daily as needed for allergies.   [DISCONTINUED] furosemide (LASIX) 20 MG tablet Take 1 tablet (20 mg total) by mouth daily. TAKE 2 TABLETS BY MOUTH IN THE MORNING, AND  1 TABLET IN THE AFTERNOON   [DISCONTINUED] gabapentin (NEURONTIN) 300 MG capsule Take 300 mg by mouth at bedtime.   [DISCONTINUED] gabapentin (NEURONTIN) 300 MG capsule Take 300 mg by mouth 2 (two) times daily.   [DISCONTINUED] hydrALAZINE (APRESOLINE) 25 MG tablet Take 1 tablet (25 mg total) by mouth every 8 (eight) hours.   [DISCONTINUED] linagliptin (TRADJENTA) 5 MG TABS tablet Take 1 tablet (5 mg total) by mouth daily.   [DISCONTINUED] nitroGLYCERIN (NITROSTAT) 0.4 MG SL tablet Place 1 tablet (0.4 mg total) under the tongue every 5 (five) minutes as needed for chest pain. x3 doses as needed for chest pain   [DISCONTINUED] Potassium Chloride Crys ER (KLOR-CON M20 PO) Take 1 tablet by mouth daily.   No facility-administered encounter medications on file as of 06/28/2021.    Review of Systems  GENERAL: No change in appetite, no fever, chills or weakness SKIN: +rash, no itching MOUTH and THROAT: Denies oral discomfort, gingival pain or  bleeding RESPIRATORY: + cough, SOB, DOE, wheezing, hemoptysis CARDIAC: No chest pain, edema or palpitations GI: No abdominal pain, diarrhea, constipation, heart burn, nausea or vomiting GU: Denies dysuria, frequency, hematuria or discharge NEUROLOGICAL: Denies dizziness, syncope, numbness, or headache PSYCHIATRIC: Denies feelings of depression or anxiety. No report of hallucinations, insomnia, paranoia, or agitation   Immunization History  Administered Date(s) Administered   Fluad Quad(high Dose 65+) 10/19/2019, 10/09/2020   Influenza Whole 10/04/2011   Influenza, High Dose Seasonal PF 08/25/2017, 09/22/2018   Influenza,inj,Quad PF,6+ Mos 09/03/2016   Influenza-Unspecified 10/04/2014   Pneumococcal Conjugate-13 10/21/2016   Pneumococcal Polysaccharide-23 04/14/2018   Pertinent  Health Maintenance Due  Topic Date Due   DEXA SCAN  Never done   INFLUENZA VACCINE  06/03/2021   HEMOGLOBIN A1C  12/11/2021   OPHTHALMOLOGY EXAM  04/09/2022   FOOT EXAM  05/22/2022  PNA vac Low Risk Adult  Completed   Fall Risk  03/09/2020 04/13/2019 04/14/2018 04/29/2016  Falls in the past year? 0 0 No Yes  Number falls in past yr: 0 0 - 1  Injury with Fall? 0 - - Yes  Risk Factor Category  - - - High Fall Risk  Risk for fall due to : - - - History of fall(s);Impaired balance/gait;Impaired mobility  Follow up Falls evaluation completed - - -     Vitals:   06/28/21 1153  BP: 133/74  Pulse: 68  Resp: 19  Temp: (!) 97.3 F (36.3 C)  Weight: 150 lb (68 kg)  Height: '5\' 4"'$  (1.626 m)   Body mass index is 25.75 kg/m.  Physical Exam  GENERAL APPEARANCE: Well nourished. In no acute distress. Normal body habitus SKIN:  red rashes on bilateral buttocks MOUTH and THROAT: Lips are without lesions. Oral mucosa is moist and without lesions.  RESPIRATORY: Breathing is even & unlabored, BS CTAB CARDIAC: RRR, no murmur,no extra heart sounds, no edema GI: Abdomen soft, normal BS, no masses, no  tenderness NEUROLOGICAL: There is no tremor. Speech is clear.  PSYCHIATRIC:  Affect and behavior are appropriate  Labs reviewed: Recent Labs    06/12/21 0255 06/13/21 0253 06/14/21 0215 06/18/21 0000  NA 136 134* 134* 136*  K 4.8 4.5 4.8 4.2  CL 105 105 106 99  CO2 20* 23 19* 25*  GLUCOSE 192* 194* 189*  --   BUN 44* 48* 52* 39*  CREATININE 0.93 1.16* 1.17* 1.1  CALCIUM 9.8 9.7 10.1 9.4   Recent Labs    06/12/21 0147 06/13/21 0253 06/14/21 0215  AST 35 24 29  ALT 19 22 34  ALKPHOS 90 78 74  BILITOT 1.2 0.6 0.7  PROT 6.1* 5.8* 6.2*  ALBUMIN 2.8* 2.6* 2.9*   Recent Labs    06/09/21 1649 06/11/21 0630 06/12/21 0147 06/13/21 0253 06/14/21 0215 06/18/21 0000  WBC 5.6   < > 8.7 9.1 9.8 12.8  NEUTROABS 3.5  --   --   --   --   --   HGB 13.5   < > 15.4* 14.6 15.5* 14.3  HCT 42.1   < > 46.0 41.9 44.9 43  MCV 93.6   < > 90.9 88.4 88.7  --   PLT 192   < > 168 184 200 149*   < > = values in this interval not displayed.   Lab Results  Component Value Date   TSH 4.270 09/03/2020   Lab Results  Component Value Date   HGBA1C 6.8 (H) 06/10/2021   Lab Results  Component Value Date   CHOL 129 10/19/2019   HDL 41.60 10/19/2019   LDLCALC 73 10/19/2019   LDLDIRECT 152.0 10/10/2013   TRIG 75.0 10/19/2019   CHOLHDL 3 10/19/2019    Significant Diagnostic Results in last 30 days:  DG Chest 1 View  Result Date: 06/09/2021 CLINICAL DATA:  SOB (shortness of breath) R06.02 (ICD-10-CM) EXAM: CHEST  1 VIEW COMPARISON:  10/30/2017. FINDINGS: Similar enlargement the cardiac silhouette. Calcific atherosclerosis of the aorta. Similar chronically prominent right paratracheal stripe, likely vascular. Similar left subclavian approach dual lead cardiac rhythm maintenance device. Similar chronic prominence of the interstitial markings. Similar linear/streaky opacity at the left lung base, likely atelectasis/scar. No consolidation. No visible pleural effusions or pneumothorax. Right  axillary clips. IMPRESSION: Chronic changes without evidence of acute cardiopulmonary disease. Electronically Signed   By: Margaretha Sheffield MD   On: 06/09/2021 17:09  CT HEAD WO CONTRAST (5MM)  Result Date: 06/10/2021 CLINICAL DATA:  Altered mental status, history of COVID-19 positivity EXAM: CT HEAD WITHOUT CONTRAST TECHNIQUE: Contiguous axial images were obtained from the base of the skull through the vertex without intravenous contrast. COMPARISON:  12/23/2014 FINDINGS: Brain: No evidence of acute infarction, hemorrhage, hydrocephalus, extra-axial collection or mass lesion/mass effect. Generalized atrophic changes are noted. Mild chronic white matter ischemic changes are seen as well. Vascular: No hyperdense vessel or unexpected calcification. Skull: Normal. Negative for fracture or focal lesion. Sinuses/Orbits: No acute finding. Other: None. IMPRESSION: Chronic atrophic and ischemic changes without acute abnormality. Electronically Signed   By: Inez Catalina M.D.   On: 06/10/2021 02:34   CT CHEST WO CONTRAST  Result Date: 06/10/2021 CLINICAL DATA:  Respiratory failure, history of COVID-19 infection EXAM: CT CHEST WITHOUT CONTRAST TECHNIQUE: Multidetector CT imaging of the chest was performed following the standard protocol without IV contrast. COMPARISON:  Chest x-ray from the previous day. FINDINGS: Cardiovascular: Limited due to the lack of IV contrast. Atherosclerotic calcifications are noted. No aneurysmal dilatation is seen. Cardiac enlargement is noted. Pacing device is again seen. Coronary calcifications are noted. No enlargement of the pulmonary artery is seen. Mediastinum/Nodes: Thoracic inlet is within normal limits with the exception of scattered hypodensities throughout the thyroid. No discrete nodule is seen. No significant enlargement is noted. No sizable hilar or mediastinal adenopathy is noted. The esophagus is within normal limits. Lungs/Pleura: Small right-sided pleural effusion is  noted. Mild bibasilar atelectatic changes are seen. No focal confluent infiltrate is noted. Mosaic attenuation is noted consistent with air trapping. No significant edema is noted. No sizable parenchymal nodule is seen. Upper Abdomen: Visualized upper abdomen show cystic lesions within the spleen similar to that noted on prior CT and ultrasound examination consistent with cysts. Musculoskeletal: Degenerative changes of the thoracic spine are noted. No acute rib abnormality is noted. IMPRESSION: Small right-sided pleural effusion. Bibasilar atelectatic changes as well as changes of air trapping. No focal confluent infiltrate is seen. Scattered hypodensities are noted throughout the thyroid. Due to the patient's age, no follow-up recommended unless clinically warranted (ref: J Am Coll Radiol. 2015 Feb;12(2): 143-50). Aortic Atherosclerosis (ICD10-I70.0). Electronically Signed   By: Inez Catalina M.D.   On: 06/10/2021 02:42    Assessment/Plan  1. Rash -  thought to be due to  a diaper rash -  continue application of zinc barrier cream to sacral area -   keep area clean and dry  2. Permanent atrial fibrillation (HCC) -  rate-controlled, continue Eliquis for anticoagulation and Coreg for rate control  3. Pneumonia due to COVID-19 virus -  Continue Rocephin   Family/ staff Communication:  Discussed plan of care with resident, daughter and charge nurse.  Labs/tests ordered:   None  Goals of care:   Short-term care   Durenda Age, DNP, MSN, FNP-BC [facility] [phone] (Monday-Friday 8:00 a.m. - 5:00 p.m.) [phone] (after hours)

## 2021-07-04 ENCOUNTER — Non-Acute Institutional Stay (SKILLED_NURSING_FACILITY): Payer: Medicare Other | Admitting: Adult Health

## 2021-07-04 ENCOUNTER — Encounter: Payer: Self-pay | Admitting: Adult Health

## 2021-07-04 DIAGNOSIS — E1140 Type 2 diabetes mellitus with diabetic neuropathy, unspecified: Secondary | ICD-10-CM

## 2021-07-04 DIAGNOSIS — U071 COVID-19: Secondary | ICD-10-CM

## 2021-07-04 DIAGNOSIS — I10 Essential (primary) hypertension: Secondary | ICD-10-CM

## 2021-07-04 DIAGNOSIS — J1282 Pneumonia due to coronavirus disease 2019: Secondary | ICD-10-CM

## 2021-07-04 DIAGNOSIS — I5042 Chronic combined systolic (congestive) and diastolic (congestive) heart failure: Secondary | ICD-10-CM

## 2021-07-04 DIAGNOSIS — J9601 Acute respiratory failure with hypoxia: Secondary | ICD-10-CM

## 2021-07-04 DIAGNOSIS — I5033 Acute on chronic diastolic (congestive) heart failure: Secondary | ICD-10-CM

## 2021-07-04 DIAGNOSIS — N3000 Acute cystitis without hematuria: Secondary | ICD-10-CM

## 2021-07-04 DIAGNOSIS — I4821 Permanent atrial fibrillation: Secondary | ICD-10-CM

## 2021-07-04 DIAGNOSIS — N1832 Chronic kidney disease, stage 3b: Secondary | ICD-10-CM

## 2021-07-04 DIAGNOSIS — N179 Acute kidney failure, unspecified: Secondary | ICD-10-CM

## 2021-07-04 NOTE — Progress Notes (Signed)
Location:  [facility] Room Number: 202-B Place of Service:  SNF (31) Provider:  Durenda Age, DNP, FNP-BC  Patient Care Team: Binnie Rail, MD as PCP - General (Internal Medicine) [name], MD (Inactive) as PCP - Cardiology (Cardiology) Thompson Grayer, MD as Consulting Physician (Clinical Cardiac Electrophysiology)  Extended Emergency Contact Information Primary Emergency Contact: Bernadene Person, [place] Phone: [phone] Relation: Daughter  Code Status: Full code   Goals of care: Advanced Directive information Advanced Directives 07/04/2021  Does Patient Have a Medical Advance Directive? No  Type of Advance Directive Miguel Barrera  Does patient want to make changes to medical advance directive? -  Copy of [place] in Chart? Yes - validated most recent copy scanned in chart (See row information)  Would patient like information on creating a medical advance directive? -  Pre-existing out of facility DNR order (yellow form or pink MOST form) -     Chief Complaint  Patient presents with   Discharge Note    HPI:  Pt is a 87 y.o. female who is for discharge home on with Home health PT, OT and Nursing.  She was admitted to [facility] on  06/14/21 post hospital admission 06/09/21 to 06/14/2021.  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  She presented to the ED via EMS for evaluation of malaise and low-grade fever.  She had a positive home COVID test day before hospitalization.  She is unvaccinated.  O2 sat was 90% on room air and improved to 96% with 2 L O2.  She was treated with remdesivir 5/5  and Solu-Medrol for 4 days and discharged on prednisone for 3 days.  She presented with creatinine 1.6, baseline creatinine was 1.1.  ACE was held.  She presented to ED with urinary frequency and urine culture grew 100 K Klebsiella which was treated with ceftriaxone for 3 days.  At [facility], she was treated with Rocephin 1 gm IM daily X 7 days for pneumonia. She has dry rashes on bilateral buttocks which were treated with Zinc barrier cream.  Patient was admitted to this facility for short-term rehabilitation after the patient's recent hospitalization.  Patient has completed SNF rehabilitation and therapy has cleared the patient for discharge.   Past Medical History:  Diagnosis Date   Breast cancer ([place])    CAD (coronary artery disease)    a. s/p DESx2 to mid and distal RCA in 2010.   Cholelithiasis 09/12/2013   Lap Chole on 09/14/13    Chronic combined systolic and diastolic CHF (congestive heart failure) ([place])    a. Echo 06/09/15: EF 55-60% -> 10/2017 EF 30-35% (pt and daughter did not wish to proceed with ischemic assessment - conservative rx).   CKD (chronic kidney disease), stage III (HCC)    Diabetes mellitus    NON INSULIN DEPENDENT   DJD (degenerative joint disease)    GERD (gastroesophageal reflux disease)    Hemorrhoids    History of diabetic neuropathy    Hypercholesterolemia    Hypertension    Hypertensive heart disease    Permanent atrial fibrillation ([place])    a. discovered on PPM interrogation 12/15- > eventually progressed  to 100% atrial fib burden/permanent atrial fib.   SSS (sick sinus syndrome) (HCC)    a. s/p Medtronic PPM by JA for SSS and syncope 9/11.   Past Surgical History:  Procedure Laterality Date   CARDIAC CATHETERIZATION  07/05/2009   EF 55-60%   CARDIOVERSION N/A 06/11/2015   Procedure: CARDIOVERSION;  Surgeon: [name], MD;  Location: [place];  Service: Cardiovascular;  Laterality: N/A;   CHOLECYSTECTOMY N/A 09/14/2013   Procedure:  LAPAROSCOPIC CHOLECYSTECTOMY WITH INTRAOPERATIVE CHOLANGIOGRAM;  Surgeon: [name], MD;  Location: [place];  Service: General;  Laterality: N/A;   COLONOSCOPY     ERCP N/A 09/13/2013   Procedure: ENDOSCOPIC RETROGRADE CHOLANGIOPANCREATOGRAPHY (ERCP);  Surgeon: [name], MD;  Location: [facility] ENDOSCOPY;  Service: Endoscopy;  Laterality: N/A;   INSERT / REPLACE / REMOVE PACEMAKER  9/11   SSS and syncope, implanted by JA (MDT)   MASTECTOMY  1992   BILATERAL WITH RECONSTRUCTION    Allergies  Allergen Reactions   Lyrica [Pregabalin] Swelling    Caused weight gain   Amlodipine Swelling   Sulfonamide Derivatives Rash    Outpatient Encounter Medications as of 07/04/2021  Medication Sig   acetaminophen (TYLENOL) 325 MG tablet Take 650 mg by mouth every 6 (six) hours as needed for fever or mild pain.   b complex vitamins tablet Take 1 tablet by mouth daily.   bisacodyl (DULCOLAX) 10 MG suppository Place 10 mg rectally as needed for moderate constipation.   docusate sodium (COLACE) 100 MG capsule Take 100 mg by mouth at bedtime.   fish oil-omega-3 fatty acids 1000 MG capsule Take 1 g by mouth every morning.   guaiFENesin (ROBITUSSIN) 100 MG/5ML liquid Give 10 ml by mouth three times a day for 2 weeks   Magnesium Hydroxide (MILK OF MAGNESIA PO) Take 30 mLs by mouth as needed.   NON FORMULARY Regular Diet   potassium chloride SA (KLOR-CON M20) 20 MEQ tablet Take 1 tablet (20 mEq total) by mouth daily.   Sodium Phosphates (RA SALINE ENEMA RE) Place 1 Dose rectally as needed.   [DISCONTINUED] apixaban (ELIQUIS) 5 MG TABS tablet Take 1 tablet by mouth twice daily   [DISCONTINUED] carvedilol (COREG) 25 MG tablet Take 1 tablet by mouth twice daily   [DISCONTINUED] fexofenadine (ALLEGRA) 180 MG tablet Take 180 mg by mouth daily as needed for allergies.   [DISCONTINUED] furosemide (LASIX) 20 MG tablet Take 1 tablet (20 mg total) by mouth daily. TAKE 2 TABLETS BY MOUTH IN THE MORNING, AND  1  TABLET IN THE AFTERNOON   [DISCONTINUED] gabapentin (NEURONTIN) 300 MG capsule Take 300 mg by mouth at bedtime.   [DISCONTINUED] gabapentin (NEURONTIN) 300 MG capsule Take 300 mg by mouth 2 (two) times daily.   [DISCONTINUED] hydrALAZINE (APRESOLINE) 25 MG tablet Take 1 tablet (25 mg total) by mouth every 8 (eight) hours.   [DISCONTINUED] linagliptin (TRADJENTA) 5 MG TABS tablet Take 1 tablet (5 mg total) by mouth daily.   [DISCONTINUED] mupirocin ointment (BACTROBAN) 2 % 1 application 2 (two) times daily. Apply to left buttocks blister BID x 7days -furuncle   [DISCONTINUED] nitroGLYCERIN (NITROSTAT) 0.4 MG SL tablet Place 1 tablet (0.4 mg total) under the tongue every 5 (five) minutes as needed for chest pain. x3 doses as needed for chest pain   [DISCONTINUED] Potassium Chloride Crys ER (KLOR-CON M20 PO) Take 1 tablet by mouth daily.   apixaban (ELIQUIS) 5 MG TABS tablet Take 1 tablet (5 mg total) by mouth 2 (  two) times daily.   carvedilol (COREG) 25 MG tablet Take 1 tablet (25 mg total) by mouth 2 (two) times daily.   fexofenadine (ALLEGRA) 180 MG tablet Take 1 tablet (180 mg total) by mouth daily as needed for allergies.   furosemide (LASIX) 20 MG tablet Take 1 tablet (20 mg total) by mouth daily. TAKE 2 TABLETS BY MOUTH IN THE MORNING, AND  1 TABLET IN THE AFTERNOON   gabapentin (NEURONTIN) 300 MG capsule Take 1 capsule (300 mg total) by mouth at bedtime.   gabapentin (NEURONTIN) 300 MG capsule Take 1 capsule (300 mg total) by mouth 2 (two) times daily.   hydrALAZINE (APRESOLINE) 25 MG tablet Take 1 tablet (25 mg total) by mouth every 8 (eight) hours.   linagliptin (TRADJENTA) 5 MG TABS tablet Take 1 tablet (5 mg total) by mouth daily.   mupirocin ointment (BACTROBAN) 2 % Apply 1 application topically 2 (two) times daily for 5 days. Apply to left buttocks blister BID x 7days -furuncle   nitroGLYCERIN (NITROSTAT) 0.4 MG SL tablet Place 1 tablet (0.4 mg total) under the tongue every 5 (five)  minutes as needed for chest pain. x3 doses as needed for chest pain   [DISCONTINUED] cefTRIAXone 1 g in dextrose 5 % 50 mL Inject 1 g into the vein daily. Give 1 gm IM daily x 7 days. dx: PNA COVID-19   No facility-administered encounter medications on file as of 07/04/2021.    Review of Systems  GENERAL: No change in appetite, no fever, chills or weakness MOUTH and THROAT: Denies oral discomfort, gingival pain or bleeding RESPIRATORY: no SOB, DOE, wheezing, hemoptysis CARDIAC: No chest pain, edema or palpitations GI: No abdominal pain, diarrhea, constipation, heart burn, nausea or vomiting GU: Denies dysuria, frequency, hematuria or discharge NEUROLOGICAL: Denies dizziness, syncope, numbness, or headache PSYCHIATRIC: Denies feelings of depression or anxiety. No report of hallucinations, insomnia, paranoia, or agitation  Immunization History  Administered Date(s) Administered   Fluad Quad(high Dose 65+) 10/19/2019, 10/09/2020   Influenza Whole 10/04/2011   Influenza, High Dose Seasonal PF 08/25/2017, 09/22/2018   Influenza,inj,Quad PF,6+ Mos 09/03/2016   Influenza-Unspecified 10/04/2014   Pneumococcal Conjugate-13 10/21/2016   Pneumococcal Polysaccharide-23 04/14/2018   Pertinent  Health Maintenance Due  Topic Date Due   DEXA SCAN  Never done   INFLUENZA VACCINE  06/03/2021   HEMOGLOBIN A1C  12/11/2021   OPHTHALMOLOGY EXAM  04/09/2022   FOOT EXAM  05/22/2022   PNA vac Low Risk Adult  Completed   Fall Risk  03/09/2020 04/13/2019 04/14/2018 04/29/2016  Falls in the past year? 0 0 No Yes  Number falls in past yr: 0 0 - 1  Injury with Fall? 0 - - Yes  Risk Factor Category  - - - High Fall Risk  Risk for fall due to : - - - History of fall(s);Impaired balance/gait;Impaired mobility  Follow up Falls evaluation completed - - -     Vitals:   07/04/21 1537  BP: 123/72  Pulse: 69  Resp: 20  Temp: 97.7 F (36.5 C)  Weight: 153 lb 1.6 oz (69.4 kg)  Height: '5\' 4"'$  (1.626 m)   Body  mass index is 26.28 kg/m.  Physical Exam  GENERAL APPEARANCE: Well nourished. In no acute distress. Normal body habitus SKIN:  rashes on bilateral buttocks. MOUTH and THROAT: Lips are without lesions. Oral mucosa is moist and without lesions.  RESPIRATORY: Breathing is even & unlabored, BS CTAB CARDIAC: RRR, no murmur,no extra heart sounds, no edema GI:  Abdomen soft, normal BS, no masses, no tenderness NEUROLOGICAL: There is no tremor. Speech is clear. PSYCHIATRIC:  Affect and behavior are appropriate  Labs reviewed: Recent Labs    06/12/21 0255 06/13/21 0253 06/14/21 0215 06/18/21 0000  NA 136 134* 134* 136*  K 4.8 4.5 4.8 4.2  CL 105 105 106 99  CO2 20* 23 19* 25*  GLUCOSE 192* 194* 189*  --   BUN 44* 48* 52* 39*  CREATININE 0.93 1.16* 1.17* 1.1  CALCIUM 9.8 9.7 10.1 9.4   Recent Labs    06/12/21 0147 06/13/21 0253 06/14/21 0215  AST 35 24 29  ALT 19 22 34  ALKPHOS 90 78 74  BILITOT 1.2 0.6 0.7  PROT 6.1* 5.8* 6.2*  ALBUMIN 2.8* 2.6* 2.9*   Recent Labs    06/09/21 1649 06/11/21 0630 06/12/21 0147 06/13/21 0253 06/14/21 0215 06/18/21 0000  WBC 5.6   < > 8.7 9.1 9.8 12.8  NEUTROABS 3.5  --   --   --   --   --   HGB 13.5   < > 15.4* 14.6 15.5* 14.3  HCT 42.1   < > 46.0 41.9 44.9 43  MCV 93.6   < > 90.9 88.4 88.7  --   PLT 192   < > 168 184 200 149*   < > = values in this interval not displayed.   Lab Results  Component Value Date   TSH 4.270 09/03/2020   Lab Results  Component Value Date   HGBA1C 6.8 (H) 06/10/2021   Lab Results  Component Value Date   CHOL 129 10/19/2019   HDL 41.60 10/19/2019   LDLCALC 73 10/19/2019   LDLDIRECT 152.0 10/10/2013   TRIG 75.0 10/19/2019   CHOLHDL 3 10/19/2019    Significant Diagnostic Results in last 30 days:  DG Chest 1 View  Result Date: 06/09/2021 CLINICAL DATA:  SOB (shortness of breath) R06.02 (ICD-10-CM) EXAM: CHEST  1 VIEW COMPARISON:  10/30/2017. FINDINGS: Similar enlargement the cardiac silhouette.  Calcific atherosclerosis of the aorta. Similar chronically prominent right paratracheal stripe, likely vascular. Similar left subclavian approach dual lead cardiac rhythm maintenance device. Similar chronic prominence of the interstitial markings. Similar linear/streaky opacity at the left lung base, likely atelectasis/scar. No consolidation. No visible pleural effusions or pneumothorax. Right axillary clips. IMPRESSION: Chronic changes without evidence of acute cardiopulmonary disease. Electronically Signed   By: Margaretha Sheffield MD   On: 06/09/2021 17:09   CT HEAD WO CONTRAST (5MM)  Result Date: 06/10/2021 CLINICAL DATA:  Altered mental status, history of COVID-19 positivity EXAM: CT HEAD WITHOUT CONTRAST TECHNIQUE: Contiguous axial images were obtained from the base of the skull through the vertex without intravenous contrast. COMPARISON:  12/23/2014 FINDINGS: Brain: No evidence of acute infarction, hemorrhage, hydrocephalus, extra-axial collection or mass lesion/mass effect. Generalized atrophic changes are noted. Mild chronic white matter ischemic changes are seen as well. Vascular: No hyperdense vessel or unexpected calcification. Skull: Normal. Negative for fracture or focal lesion. Sinuses/Orbits: No acute finding. Other: None. IMPRESSION: Chronic atrophic and ischemic changes without acute abnormality. Electronically Signed   By: Inez Catalina M.D.   On: 06/10/2021 02:34   CT CHEST WO CONTRAST  Result Date: 06/10/2021 CLINICAL DATA:  Respiratory failure, history of COVID-19 infection EXAM: CT CHEST WITHOUT CONTRAST TECHNIQUE: Multidetector CT imaging of the chest was performed following the standard protocol without IV contrast. COMPARISON:  Chest x-ray from the previous day. FINDINGS: Cardiovascular: Limited due to the lack of IV contrast. Atherosclerotic calcifications  are noted. No aneurysmal dilatation is seen. Cardiac enlargement is noted. Pacing device is again seen. Coronary calcifications are  noted. No enlargement of the pulmonary artery is seen. Mediastinum/Nodes: Thoracic inlet is within normal limits with the exception of scattered hypodensities throughout the thyroid. No discrete nodule is seen. No significant enlargement is noted. No sizable hilar or mediastinal adenopathy is noted. The esophagus is within normal limits. Lungs/Pleura: Small right-sided pleural effusion is noted. Mild bibasilar atelectatic changes are seen. No focal confluent infiltrate is noted. Mosaic attenuation is noted consistent with air trapping. No significant edema is noted. No sizable parenchymal nodule is seen. Upper Abdomen: Visualized upper abdomen show cystic lesions within the spleen similar to that noted on prior CT and ultrasound examination consistent with cysts. Musculoskeletal: Degenerative changes of the thoracic spine are noted. No acute rib abnormality is noted. IMPRESSION: Small right-sided pleural effusion. Bibasilar atelectatic changes as well as changes of air trapping. No focal confluent infiltrate is seen. Scattered hypodensities are noted throughout the thyroid. Due to the patient's age, no follow-up recommended unless clinically warranted (ref: J Am Coll Radiol. 2015 Feb;12(2): 143-50). Aortic Atherosclerosis (ICD10-I70.0). Electronically Signed   By: Inez Catalina M.D.   On: 06/10/2021 02:42    Assessment/Plan  1. Acute hypoxemic respiratory failure (HCC) due to Covid- 19 -  O2 sat dropped  to 88% when on room air and up to 98% with O2 at 2L/min via [place]  2. Pneumonia due to COVID-19 virus -  completed Ceftriaxone X 7 days -  resolved  3. Chronic combined systolic and diastolic CHF (congestive heart failure) (HCC) - potassium chloride SA (KLOR-CON M20) 20 MEQ tablet; Take 1 tablet (20 mEq total) by mouth daily.  Dispense: 30 tablet; Refill: 0 - furosemide (LASIX) 20 MG tablet; Take 1 tablet (20 mg total) by mouth daily. TAKE 2 TABLETS BY MOUTH IN THE MORNING, AND  1 TABLET IN THE AFTERNOON   Dispense: 90 tablet; Refill: 0  4. Type 2 diabetes mellitus with diabetic neuropathy, without long-term current use of insulin (HCC) Lab Results  Component Value Date   HGBA1C 6.8 (H) 06/10/2021   - linagliptin (TRADJENTA) 5 MG TABS tablet; Take 1 tablet (5 mg total) by mouth daily.  Dispense: 30 tablet; Refill: 0 - gabapentin (NEURONTIN) 300 MG capsule; Take 1 capsule (300 mg total) by mouth at bedtime.  Dispense: 30 capsule; Refill: 0 - gabapentin (NEURONTIN) 300 MG capsule; Take 1 capsule (300 mg total) by mouth 2 (two) times daily.  Dispense: 60 capsule; Refill: 0  5. Permanent atrial fibrillation (HCC) - apixaban (ELIQUIS) 5 MG TABS tablet; Take 1 tablet (5 mg total) by mouth 2 (two) times daily.  Dispense: 60 tablet; Refill: 0 - carvedilol (COREG) 25 MG tablet; Take 1 tablet (25 mg total) by mouth 2 (two) times daily.  Dispense: 60 tablet; Refill: 0  6. Primary hypertension - hydrALAZINE (APRESOLINE) 25 MG tablet; Take 1 tablet (25 mg total) by mouth every 8 (eight) hours.  Dispense: 90 tablet; Refill: 0  7. Acute renal failure superimposed on stage 3b chronic kidney disease, unspecified acute renal failure type [facility]) Lab Results  Component Value Date   NA 136 (A) 06/18/2021   K 4.2 06/18/2021   CO2 25 (A) 06/18/2021   GLUCOSE 189 (H) 06/14/2021   BUN 39 (A) 06/18/2021   CREATININE 1.1 06/18/2021   CALCIUM 9.4 06/18/2021   GFRNONAA 46.13 06/18/2021   GFRAA 53.47 06/18/2021   -  stable  8. Acute cystitis  without hematuria -  treated with Ceftriaxone -  resolved    I have filled out patient's discharge paperwork and e-prescribed medications.  Patient will have home health PT, OT and Nurse.  DME provided: O2, wheelchair and semielectric hospital bed  Wheelchair  -patient suffered from COVID-19 which caused generalized weakness impairing her ability to perform daily activities like toileting, feeding, dressing, grooming and bathing in the home.  A cane or walker will not  resolve issue with performing activities of daily living.  A wheelchair will allow patient to safely perform daily activities.  Patient has a caregiver in the home who can provide assistance.  Semielectric hospital bed  -patient suffers from combined systolic and diastolic heart failure and has trouble breathing at night when head is elevated less than 30 degrees.  Bed wedges do not provide enough elevation to resolve breathing issues.  Shortness of breath cause patient to require frequent and immediate changes in body position which cannot be achieved with a normal bed.  Total discharge time: Greater than 30 minutes  Discharge time involved coordination of the discharge process with social worker, nursing staff and therapy department. Medical justification for home health services/DME verified.     Durenda Age, DNP, MSN, FNP-BC [facility] [phone] (Monday-Friday 8:00 a.m. - 5:00 p.m.) [phone] (after hours)

## 2021-07-05 ENCOUNTER — Encounter: Payer: Self-pay | Admitting: Internal Medicine

## 2021-07-05 ENCOUNTER — Other Ambulatory Visit: Payer: Self-pay

## 2021-07-05 ENCOUNTER — Ambulatory Visit: Payer: Medicare Other | Admitting: Internal Medicine

## 2021-07-05 VITALS — BP 102/62 | HR 72 | Temp 98.5°F

## 2021-07-05 DIAGNOSIS — R21 Rash and other nonspecific skin eruption: Secondary | ICD-10-CM

## 2021-07-05 MED ORDER — MUPIROCIN 2 % EX OINT: 1.0000 "application " | TOPICAL_OINTMENT | Freq: Two times a day (BID) | CUTANEOUS | 0 refills | Status: AC

## 2021-07-05 MED ORDER — FEXOFENADINE HCL 180 MG PO TABS: 180.0000 mg | ORAL_TABLET | Freq: Every day | ORAL | 0 refills | Status: DC | PRN

## 2021-07-05 MED ORDER — POTASSIUM CHLORIDE CRYS ER 20 MEQ PO TBCR: 20.0000 meq | EXTENDED_RELEASE_TABLET | Freq: Every day | ORAL | 0 refills | Status: DC

## 2021-07-05 MED ORDER — APIXABAN 5 MG PO TABS: 5.0000 mg | ORAL_TABLET | Freq: Two times a day (BID) | ORAL | 0 refills | Status: AC

## 2021-07-05 MED ORDER — GABAPENTIN 300 MG PO CAPS: 300.0000 mg | ORAL_CAPSULE | Freq: Every day | ORAL | 0 refills | Status: DC

## 2021-07-05 MED ORDER — CARVEDILOL 25 MG PO TABS: 25.0000 mg | ORAL_TABLET | Freq: Two times a day (BID) | ORAL | 0 refills | Status: DC

## 2021-07-05 MED ORDER — HYDRALAZINE HCL 25 MG PO TABS: 25.0000 mg | ORAL_TABLET | Freq: Three times a day (TID) | ORAL | 0 refills | Status: DC

## 2021-07-05 MED ORDER — LINAGLIPTIN 5 MG PO TABS: 5.0000 mg | ORAL_TABLET | Freq: Every day | ORAL | 0 refills | Status: DC

## 2021-07-05 MED ORDER — NITROGLYCERIN 0.4 MG SL SUBL: 0.4000 mg | SUBLINGUAL_TABLET | SUBLINGUAL | 0 refills | Status: AC | PRN

## 2021-07-05 MED ORDER — FUROSEMIDE 20 MG PO TABS: 20.0000 mg | ORAL_TABLET | Freq: Every day | ORAL | 0 refills | Status: DC

## 2021-07-05 MED ORDER — GABAPENTIN 300 MG PO CAPS: 300.0000 mg | ORAL_CAPSULE | Freq: Two times a day (BID) | ORAL | 0 refills | Status: DC

## 2021-07-05 NOTE — Progress Notes (Signed)
Acute office Visit     This visit occurred during the SARS-CoV-2 public health emergency.  Safety protocols were in place, including screening questions prior to the visit, additional usage of staff PPE, and extensive cleaning of exam room while observing appropriate contact time as indicated for disinfecting solutions.    CC/Reason for Visit: Rash on her bottom  HPI: Jessica [name] is a 87 y.o. female who is coming in today for the above mentioned reasons.  She was hospitalized from August 7 through August 12 for COVID-pneumonia and then discharged to [facility] skilled nursing facility.  She was just discharged today.  Her daughter wanted to bring her in for evaluation of a blistery rash of her bottom.  Her PCP was full.  Rash is painless.  The blisters have mostly ruptured and are in stages of healing.  Past Medical/Surgical History: Past Medical History:  Diagnosis Date   Breast cancer ([place])    CAD (coronary artery disease)    a. s/p DESx2 to mid and distal RCA in 2010.   Cholelithiasis 09/12/2013   Lap Chole on 09/14/13    Chronic combined systolic and diastolic CHF (congestive heart failure) ([place])    a. Echo 06/09/15: EF 55-60% -> 10/2017 EF 30-35% (pt and daughter did not wish to proceed with ischemic assessment - conservative rx).   CKD (chronic kidney disease), stage III (HCC)    Diabetes mellitus    NON INSULIN DEPENDENT   DJD (degenerative joint disease)    GERD (gastroesophageal reflux disease)    Hemorrhoids    History of diabetic neuropathy    Hypercholesterolemia    Hypertension    Hypertensive heart disease    Permanent atrial fibrillation ([place])    a. discovered on PPM interrogation 12/15- > eventually progressed to 100% atrial fib burden/permanent atrial fib.   SSS (sick sinus syndrome) (HCC)    a. s/p Medtronic PPM by JA for SSS and syncope 9/11.    Past Surgical History:  Procedure Laterality Date   CARDIAC CATHETERIZATION  07/05/2009   EF 55-60%    CARDIOVERSION N/A 06/11/2015   Procedure: CARDIOVERSION;  Surgeon: [name], MD;  Location: [place];  Service: Cardiovascular;  Laterality: N/A;   CHOLECYSTECTOMY N/A 09/14/2013   Procedure: LAPAROSCOPIC CHOLECYSTECTOMY WITH INTRAOPERATIVE CHOLANGIOGRAM;  Surgeon: [name], MD;  Location: [place];  Service: General;  Laterality: N/A;   COLONOSCOPY     ERCP N/A 09/13/2013   Procedure: ENDOSCOPIC RETROGRADE CHOLANGIOPANCREATOGRAPHY (ERCP);  Surgeon: [name], MD;  Location: [facility] ENDOSCOPY;  Service: Endoscopy;  Laterality: N/A;   INSERT / REPLACE / REMOVE PACEMAKER  9/11   SSS and syncope, implanted by JA (MDT)   MASTECTOMY  1992   BILATERAL WITH RECONSTRUCTION    Social History:  reports that she has never smoked. She has never used smokeless tobacco. She reports that she does not drink alcohol and does not use drugs.  Allergies: Allergies  Allergen Reactions   Lyrica [Pregabalin] Swelling    Caused weight gain   Amlodipine Swelling   Sulfonamide Derivatives Rash    Family History:  Family History  Problem Relation Age of Onset   Cancer Mother    Cancer Father      Current Outpatient Medications:    acetaminophen (TYLENOL) 325 MG tablet, Take 650 mg by mouth every 6 (six) hours as needed for fever or mild pain., Disp: , Rfl:    apixaban (ELIQUIS) 5 MG TABS tablet, Take 1 tablet by  mouth twice daily, Disp: 180 tablet, Rfl: 1   b complex vitamins tablet, Take 1 tablet by mouth daily., Disp: , Rfl:    bisacodyl (DULCOLAX) 10 MG suppository, Place 10 mg rectally as needed for moderate constipation., Disp: , Rfl:    carvedilol (COREG) 25 MG tablet, Take 1 tablet by mouth twice daily, Disp: 180 tablet, Rfl: 2   docusate sodium (COLACE) 100 MG capsule, Take 100 mg by mouth at bedtime., Disp: , Rfl:    fexofenadine (ALLEGRA) 180 MG tablet, Take 180 mg by mouth daily as needed for allergies., Disp: , Rfl:    fish oil-omega-3 fatty acids 1000 MG capsule, Take 1 g  by mouth every morning., Disp: , Rfl:    furosemide (LASIX) 20 MG tablet, Take 1 tablet (20 mg total) by mouth daily. TAKE 2 TABLETS BY MOUTH IN THE MORNING, AND  1 TABLET IN THE AFTERNOON, Disp: 270 tablet, Rfl: 3   gabapentin (NEURONTIN) 300 MG capsule, Take 300 mg by mouth at bedtime., Disp: , Rfl:    gabapentin (NEURONTIN) 300 MG capsule, Take 300 mg by mouth 3 (three) times daily., Disp: , Rfl:    guaiFENesin (ROBITUSSIN) 100 MG/5ML liquid, Give 10 ml by mouth three times a day for 2 weeks, Disp: , Rfl:    hydrALAZINE (APRESOLINE) 25 MG tablet, Take 1 tablet (25 mg total) by mouth every 8 (eight) hours., Disp: 30 tablet, Rfl: 0   linagliptin (TRADJENTA) 5 MG TABS tablet, Take 1 tablet (5 mg total) by mouth daily., Disp: 30 tablet, Rfl: 0   Magnesium Hydroxide (MILK OF MAGNESIA PO), Take 30 mLs by mouth as needed., Disp: , Rfl:    mupirocin ointment (BACTROBAN) 2 %, 1 application 2 (two) times daily. Apply to left buttocks blister BID x 7days -furuncle, Disp: , Rfl:    nitroGLYCERIN (NITROSTAT) 0.4 MG SL tablet, Place 1 tablet (0.4 mg total) under the tongue every 5 (five) minutes as needed for chest pain. x3 doses as needed for chest pain, Disp: 25 tablet, Rfl: 0   NON FORMULARY, Regular Diet, Disp: , Rfl:    Potassium Chloride Crys ER (KLOR-CON M20 PO), Take 1 tablet by mouth daily., Disp: , Rfl:    Sodium Phosphates (RA SALINE ENEMA RE), Place 1 Dose rectally as needed., Disp: , Rfl:   Review of Systems:  Constitutional: Denies fever, chills, diaphoresis, appetite change and fatigue.  HEENT: Denies photophobia, eye pain, redness, hearing loss, ear pain, congestion, sore throat, rhinorrhea, sneezing, mouth sores, trouble swallowing, neck pain, neck stiffness and tinnitus.   Respiratory: Denies SOB, DOE, cough, chest tightness,  and wheezing.   Cardiovascular: Denies chest pain, palpitations and leg swelling.  Gastrointestinal: Denies nausea, vomiting, abdominal pain, diarrhea, constipation,  blood in stool and abdominal distention.  Genitourinary: Denies dysuria, urgency, frequency, hematuria, flank pain and difficulty urinating.  Endocrine: Denies: hot or cold intolerance, sweats, changes in hair or nails, polyuria, polydipsia. Musculoskeletal: Denies myalgias, back pain, joint swelling, arthralgias and gait problem.  Skin: Denies pallor and wound.  Neurological: Denies dizziness, seizures, syncope, light-headedness, numbness and headaches.  Hematological: Denies adenopathy. Easy bruising, personal or family bleeding history  Psychiatric/Behavioral: Denies suicidal ideation, mood changes, confusion, nervousness, sleep disturbance and agitation    Physical Exam: Vitals:   07/05/21 1455  BP: 102/62  Pulse: 72  Temp: 98.5 F (36.9 C)  TempSrc: Oral  SpO2: 94%    There is no height or weight on file to calculate BMI.   Constitutional: NAD, calm,  comfortable, in wheelchair Eyes: PERRL, lids and conjunctivae normal ENMT: Mucous membranes are moist.  Skin: About 8-10 areas of what appear to have been initially a blistery rash that have ruptured and are in various stages of healing, no signs of purulent drainage, surrounding erythema. Psychiatric: Normal judgment and insight. Alert and oriented x 3. Normal mood.    Impression and Plan:  Rash -Initial etiology unclear. -Does not appear to be a typical pressure ulcer. -I wonder about possibly herpes although also not typical. -In any case they appear to be healing. -I have discussed with daughter frequent diaper changes, usage of a thick barrier cream and pillow positioning to alternate areas of pressure. -I do not believe antibiotics are necessary at this time.      Lelon Frohlich, MD [facility]

## 2021-07-09 ENCOUNTER — Ambulatory Visit: Payer: Medicare Other | Admitting: Internal Medicine

## 2021-07-09 ENCOUNTER — Inpatient Hospital Stay: Payer: Medicare Other | Admitting: Internal Medicine

## 2021-07-09 ENCOUNTER — Encounter: Payer: Self-pay | Admitting: Internal Medicine

## 2021-07-09 ENCOUNTER — Other Ambulatory Visit: Payer: Self-pay

## 2021-07-09 DIAGNOSIS — I10 Essential (primary) hypertension: Secondary | ICD-10-CM | POA: Diagnosis not present

## 2021-07-09 DIAGNOSIS — K5909 Other constipation: Secondary | ICD-10-CM

## 2021-07-09 DIAGNOSIS — U071 COVID-19: Secondary | ICD-10-CM

## 2021-07-09 DIAGNOSIS — E1140 Type 2 diabetes mellitus with diabetic neuropathy, unspecified: Secondary | ICD-10-CM

## 2021-07-09 DIAGNOSIS — N1832 Chronic kidney disease, stage 3b: Secondary | ICD-10-CM

## 2021-07-09 DIAGNOSIS — J9601 Acute respiratory failure with hypoxia: Secondary | ICD-10-CM

## 2021-07-09 DIAGNOSIS — I480 Paroxysmal atrial fibrillation: Secondary | ICD-10-CM

## 2021-07-09 DIAGNOSIS — L8990 Pressure ulcer of unspecified site, unspecified stage: Secondary | ICD-10-CM | POA: Insufficient documentation

## 2021-07-09 DIAGNOSIS — L89302 Pressure ulcer of unspecified buttock, stage 2: Secondary | ICD-10-CM

## 2021-07-09 DIAGNOSIS — I4891 Unspecified atrial fibrillation: Secondary | ICD-10-CM | POA: Insufficient documentation

## 2021-07-09 NOTE — Assessment & Plan Note (Signed)
Chronic Blood pressure well controlled Continue Coreg 25 mg twice daily, Lasix 20 mg daily, hydralazine 25 mg 3 times daily

## 2021-07-09 NOTE — Patient Instructions (Addendum)
   Medications changes include :   none    Please followup in 6 months   

## 2021-07-09 NOTE — Progress Notes (Signed)
Subjective:    Patient ID: Jessica [name], female    DOB: [birth year], 87 y.o.   MRN: [id number]  HPI The patient is here for follow up of their chronic medical problems, including A. fib, combined heart failure, hypertension, diabetes with neuropathy, hyperlipidemia, chronic kidney disease.  She is here with her daughter who provides most of the history.   Had covid - last month and was in the hospital and then rehab.  She is doing better, but still has generalized weakness-her legs are weak and her balance is still poor.  She is not walking much.  Bedsore - b/l groin and buttocks.  There was pus but that went away.  Some have bled.  Putting on bactroban.   Her daughter states she does have some bleeding when she wipes her and is unsure if it is from a bedsore or hemorrhoid.         Medications and allergies reviewed with patient and updated if appropriate.  Patient Active Problem List   Diagnosis Date Noted   Acute hypoxemic respiratory failure due to COVID-19 ([place]) 06/10/2021   Acute respiratory failure with hypoxia (HCC) 06/10/2021   Abdominal bloating 03/09/2020   Chronic constipation 03/09/2020   H/O bilateral mastectomy 10/13/2018   HX: breast cancer 10/13/2018   Permanent atrial fibrillation ([place]) 10/31/2017   Cardiac pacemaker in situ    Chronic combined systolic and diastolic CHF (congestive heart failure) ([place]) 10/12/2017   UTI (urinary tract infection) 03/28/2017   Acute-on-chronic kidney injury ([place]) 03/28/2017   Bilateral leg edema 04/29/2016   CKD (chronic kidney disease) stage 3, GFR 30-59 ml/min (HCC) 06/10/2015   CAD (coronary artery disease) 06/09/2015   Diabetic neuropathy ([place]) 07/06/2013   SSS (sick sinus syndrome) ([place]) 02/10/2013   Hypertension 07/30/2012   Pacemaker-Medtronic 07/19/2012   Benign hypertensive heart disease with heart failure ([place]) 01/28/2012   Type 2 diabetes mellitus with diabetic neuropathy, unspecified ([place]) 10/22/2010    HYPERCHOLESTEROLEMIA 10/22/2010   DEGENERATIVE JOINT DISEASE 10/22/2010    Current Outpatient Medications on File Prior to Visit  Medication Sig Dispense Refill   acetaminophen (TYLENOL) 325 MG tablet Take 650 mg by mouth every 6 (six) hours as needed for fever or mild pain.     apixaban (ELIQUIS) 5 MG TABS tablet Take 1 tablet (5 mg total) by mouth 2 (two) times daily. 60 tablet 0   b complex vitamins tablet Take 1 tablet by mouth daily.     bisacodyl (DULCOLAX) 10 MG suppository Place 10 mg rectally as needed for moderate constipation.     carvedilol (COREG) 25 MG tablet Take 1 tablet (25 mg total) by mouth 2 (two) times daily. 60 tablet 0   docusate sodium (COLACE) 100 MG capsule Take 100 mg by mouth at bedtime.     fexofenadine (ALLEGRA) 180 MG tablet Take 1 tablet (180 mg total) by mouth daily as needed for allergies. 30 tablet 0   fish oil-omega-3 fatty acids 1000 MG capsule Take 1 g by mouth every morning.     furosemide (LASIX) 20 MG tablet Take 1 tablet (20 mg total) by mouth daily. TAKE 2 TABLETS BY MOUTH IN THE MORNING, AND  1 TABLET IN THE AFTERNOON 90 tablet 0   gabapentin (NEURONTIN) 300 MG capsule Take 1 capsule (300 mg total) by mouth 2 (two) times daily. 60 capsule 0   guaiFENesin (ROBITUSSIN) 100 MG/5ML liquid Give 10 ml by mouth three times a day for 2 weeks  hydrALAZINE (APRESOLINE) 25 MG tablet Take 1 tablet (25 mg total) by mouth every 8 (eight) hours. 90 tablet 0   linagliptin (TRADJENTA) 5 MG TABS tablet Take 1 tablet (5 mg total) by mouth daily. 30 tablet 0   Magnesium Hydroxide (MILK OF MAGNESIA PO) Take 30 mLs by mouth as needed.     mupirocin ointment (BACTROBAN) 2 % Apply 1 application topically 2 (two) times daily for 5 days. Apply to left buttocks blister BID x 7days -furuncle 10 g 0   nitroGLYCERIN (NITROSTAT) 0.4 MG SL tablet Place 1 tablet (0.4 mg total) under the tongue every 5 (five) minutes as needed for chest pain. x3 doses as needed for chest pain 25  tablet 0   NON FORMULARY Regular Diet     potassium chloride SA (KLOR-CON M20) 20 MEQ tablet Take 1 tablet (20 mEq total) by mouth daily. 30 tablet 0   Sodium Phosphates (RA SALINE ENEMA RE) Place 1 Dose rectally as needed.     gabapentin (NEURONTIN) 300 MG capsule Take 1 capsule (300 mg total) by mouth at bedtime. (Patient not taking: Reported on 07/09/2021) 30 capsule 0   No current facility-administered medications on file prior to visit.    Past Medical History:  Diagnosis Date   Breast cancer ([place])    CAD (coronary artery disease)    a. s/p DESx2 to mid and distal RCA in 2010.   Cholelithiasis 09/12/2013   Lap Chole on 09/14/13    Chronic combined systolic and diastolic CHF (congestive heart failure) ([place])    a. Echo 06/09/15: EF 55-60% -> 10/2017 EF 30-35% (pt and daughter did not wish to proceed with ischemic assessment - conservative rx).   CKD (chronic kidney disease), stage III (HCC)    Diabetes mellitus    NON INSULIN DEPENDENT   DJD (degenerative joint disease)    GERD (gastroesophageal reflux disease)    Hemorrhoids    History of diabetic neuropathy    Hypercholesterolemia    Hypertension    Hypertensive heart disease    Permanent atrial fibrillation ([place])    a. discovered on PPM interrogation 12/15- > eventually progressed to 100% atrial fib burden/permanent atrial fib.   SSS (sick sinus syndrome) (HCC)    a. s/p Medtronic PPM by JA for SSS and syncope 9/11.    Past Surgical History:  Procedure Laterality Date   CARDIAC CATHETERIZATION  07/05/2009   EF 55-60%   CARDIOVERSION N/A 06/11/2015   Procedure: CARDIOVERSION;  Surgeon: [name], MD;  Location: [place];  Service: Cardiovascular;  Laterality: N/A;   CHOLECYSTECTOMY N/A 09/14/2013   Procedure: LAPAROSCOPIC CHOLECYSTECTOMY WITH INTRAOPERATIVE CHOLANGIOGRAM;  Surgeon: [name], MD;  Location: [place];  Service: General;  Laterality: N/A;   COLONOSCOPY     ERCP N/A 09/13/2013   Procedure:  ENDOSCOPIC RETROGRADE CHOLANGIOPANCREATOGRAPHY (ERCP);  Surgeon: [name], MD;  Location: [facility] ENDOSCOPY;  Service: Endoscopy;  Laterality: N/A;   INSERT / REPLACE / REMOVE PACEMAKER  9/11   SSS and syncope, implanted by JA (MDT)   MASTECTOMY  1992   BILATERAL WITH RECONSTRUCTION    Social History   Socioeconomic History   Marital status: Widowed    Spouse name: Not on file   Number of children: Not on file   Years of education: Not on file   Highest education level: Not on file  Occupational History   Not on file  Tobacco Use   Smoking status: Never   Smokeless tobacco: Never  Vaping Use   Vaping Use: Never used  Substance and Sexual Activity   Alcohol use: No    Alcohol/week: 0.0 standard drinks   Drug use: No   Sexual activity: Not on file  Other Topics Concern   Not on file  Social History Narrative   Not on file   Social Determinants of Health   Financial Resource Strain: Not on file  Food Insecurity: Not on file  Transportation Needs: Not on file  Physical Activity: Not on file  Stress: Not on file  Social Connections: Not on file    Family History  Problem Relation Age of Onset   Cancer Mother    Cancer Father     Review of Systems  Constitutional:  Negative for appetite change (eating good) and fever.  Respiratory:  Positive for cough (improving) and shortness of breath (improving). Negative for wheezing.   Cardiovascular:  Positive for leg swelling. Negative for chest pain and palpitations.  Gastrointestinal:  Positive for constipation. Negative for abdominal pain.  Neurological:  Negative for dizziness, light-headedness and headaches.  Psychiatric/Behavioral:  Negative for sleep disturbance.       Objective:   Vitals:   07/09/21 1038  BP: 108/60  Pulse: 73  Temp: 98.8 F (37.1 C)  SpO2: 95%   BP Readings from Last 3 Encounters:  07/09/21 108/60  07/05/21 102/62  07/04/21 123/72   Wt Readings from Last 3 Encounters:  07/09/21 151  lb (68.5 kg)  07/04/21 153 lb 1.6 oz (69.4 kg)  06/28/21 150 lb (68 kg)   Body mass index is 25.92 kg/m.   Physical Exam    Constitutional: Appears well-developed and well-nourished. No distress.  HENT:  Head: Normocephalic and atraumatic.  Neck: Neck supple. No tracheal deviation present. No thyromegaly present.  No cervical lymphadenopathy Cardiovascular: Normal rate, regular rhythm and normal heart sounds.   No murmur heard. No carotid bruit .  Trace bilateral lower extremity edema Pulmonary/Chest: Effort normal and breath sounds normal. No respiratory distress. No has no wheezes. No rales. Abdomen: Soft, nontender GU: No external hemorrhoid Skin: Skin is warm and dry. Not diaphoretic.  Several healing sores on bilateral buttocks-1 is slightly open with dried blood, 1 looks like it is a small ulceration.  None with active bleeding or discharge     Assessment & Plan:    See Problem List for Assessment and Plan of chronic medical problems.    This visit occurred during the SARS-CoV-2 public health emergency.  Safety protocols were in place, including screening questions prior to the visit, additional usage of staff PPE, and extensive cleaning of exam room while observing appropriate contact time as indicated for disinfecting solutions.

## 2021-07-09 NOTE — Assessment & Plan Note (Signed)
Chronic Currently in sinus rhythm Following with cardiology Continue Eliquis 5 mg twice daily, carvedilol 25 mg twice daily

## 2021-07-09 NOTE — Assessment & Plan Note (Signed)
New problem This occurred while she was in rehab Bilateral buttock and groin sores-do not really look like there pressure sores, but could be related to wearing a soiled diaper for an extended period of time in addition to pressure/being in bed Her daughter has been applying Bactroban ointment and that has been helping-they are slowly improving Her daughter is rotating her and trying to take off the pressure from the area She will continue to monitor and if they do not continue to improve she will let me know

## 2021-07-09 NOTE — Assessment & Plan Note (Addendum)
Chronic Bleeding is not likely from a source since there is not 1 in that direct area-likely related to an internal hemorrhoid Add back Metamucil daily Okay to use topical Preparation H as needed Advised daughter to call if bleeding persists

## 2021-07-09 NOTE — Assessment & Plan Note (Signed)
Chronic Stable 

## 2021-07-09 NOTE — Assessment & Plan Note (Signed)
Improved Currently on room air and oxygenation is 95% She is using oxygen at night only Lung exam normal Will try to slowly wean off oxygen

## 2021-07-09 NOTE — Assessment & Plan Note (Signed)
Chronic Blood sugars are well controlled Advised that she can stop checking her sugars on a daily basis-can you check them if there is a concern based on symptoms Continue Tradjenta 5 mg daily

## 2021-07-10 ENCOUNTER — Ambulatory Visit (INDEPENDENT_AMBULATORY_CARE_PROVIDER_SITE_OTHER): Payer: Medicare Other

## 2021-07-10 DIAGNOSIS — I495 Sick sinus syndrome: Secondary | ICD-10-CM | POA: Diagnosis not present

## 2021-07-11 LAB — CUP PACEART REMOTE DEVICE CHECK
Battery Impedance: 1909 Ohm
Battery Remaining Longevity: 37 mo
Battery Voltage: 2.76 V
Brady Statistic AP VP Percent: 0 %
Brady Statistic AP VS Percent: 0 %
Brady Statistic AS VP Percent: 33 %
Brady Statistic AS VS Percent: 66 %
Date Time Interrogation Session: 20220907201304
Implantable Lead Implant Date: 20110912
Implantable Lead Implant Date: 20110912
Implantable Lead Location: 753859
Implantable Lead Location: 753860
Implantable Lead Model: 5076
Implantable Lead Model: 5092
Implantable Pulse Generator Implant Date: 20110912
Lead Channel Impedance Value: 472 Ohm
Lead Channel Impedance Value: 572 Ohm
Lead Channel Pacing Threshold Amplitude: 0.375 V
Lead Channel Pacing Threshold Amplitude: 0.75 V
Lead Channel Pacing Threshold Pulse Width: 0.4 ms
Lead Channel Pacing Threshold Pulse Width: 0.4 ms
Lead Channel Setting Pacing Amplitude: 2 V
Lead Channel Setting Pacing Amplitude: 2.5 V
Lead Channel Setting Pacing Pulse Width: 0.4 ms
Lead Channel Setting Sensing Sensitivity: 2 mV

## 2021-07-18 NOTE — Progress Notes (Signed)
Remote pacemaker transmission.   

## 2021-07-19 ENCOUNTER — Telehealth: Payer: Self-pay

## 2021-07-19 NOTE — Telephone Encounter (Signed)
ok 

## 2021-07-19 NOTE — Telephone Encounter (Signed)
Ok for verbal 

## 2021-07-19 NOTE — Telephone Encounter (Signed)
Verbal orders needed for PT 1 week 9  Working on strength, endurance, and balance.  Jessica [name] [phone]

## 2021-07-19 NOTE — Telephone Encounter (Signed)
Jessica [name] from [place] has called for orders in regards to patient being able to be seen [facility] OT this week.  An order is needed to see the patient next week.[facility] number is [phone]

## 2021-07-22 NOTE — Telephone Encounter (Signed)
Message left today with verbals. 

## 2021-07-22 NOTE — Telephone Encounter (Signed)
Verbals left for today 

## 2021-07-24 ENCOUNTER — Telehealth: Payer: Self-pay | Admitting: Internal Medicine

## 2021-07-24 NOTE — Telephone Encounter (Signed)
   [place] Name: [place] Name: [facility] Callback Phone #: [phone] Service Requested: OT Frequency of Visits: [phone]

## 2021-07-24 NOTE — Telephone Encounter (Signed)
Message left today with verbals. 

## 2021-08-06 ENCOUNTER — Telehealth: Payer: Self-pay | Admitting: Internal Medicine

## 2021-08-06 NOTE — Telephone Encounter (Signed)
[place] Name: [facility] Agency Name: Moreen Fowler Phone #: [phone] Service Requested: Nursing Frequency of Visits: n/a  Redness on patient bottom has turned into open wounds  Would like verbal order for nurse to assess & treat wounds..okay to lvm on confidential vm

## 2021-08-06 NOTE — Telephone Encounter (Signed)
Okay for verbal order

## 2021-08-07 NOTE — Telephone Encounter (Signed)
Verbals left today. 

## 2021-08-23 ENCOUNTER — Encounter: Payer: Self-pay | Admitting: Podiatry

## 2021-08-23 ENCOUNTER — Ambulatory Visit: Payer: Medicare Other | Admitting: Podiatry

## 2021-08-23 ENCOUNTER — Other Ambulatory Visit: Payer: Self-pay

## 2021-08-23 DIAGNOSIS — L84 Corns and callosities: Secondary | ICD-10-CM

## 2021-08-23 DIAGNOSIS — M79676 Pain in unspecified toe(s): Secondary | ICD-10-CM | POA: Diagnosis not present

## 2021-08-23 DIAGNOSIS — E1142 Type 2 diabetes mellitus with diabetic polyneuropathy: Secondary | ICD-10-CM | POA: Diagnosis not present

## 2021-08-23 DIAGNOSIS — H0011 Chalazion right upper eyelid: Secondary | ICD-10-CM | POA: Insufficient documentation

## 2021-08-23 DIAGNOSIS — C44622 Squamous cell carcinoma of skin of right upper limb, including shoulder: Secondary | ICD-10-CM | POA: Insufficient documentation

## 2021-08-23 DIAGNOSIS — D485 Neoplasm of uncertain behavior of skin: Secondary | ICD-10-CM | POA: Insufficient documentation

## 2021-08-23 DIAGNOSIS — B351 Tinea unguium: Secondary | ICD-10-CM | POA: Diagnosis not present

## 2021-08-30 NOTE — Progress Notes (Signed)
  Subjective:  Patient ID: Jessica [name], female    DOB: 1926/12/11,  MRN: [id number]  87 y.o. female presents at risk foot care with history of diabetic neuropathy and callus(es) plantar aspect of left foot and painful thick toenails that are difficult to trim. Painful toenails interfere with ambulation. Aggravating factors include wearing enclosed shoe gear. Pain is relieved with periodic professional debridement. Painful calluses are aggravated when weightbearing with and without shoegear. Pain is relieved with periodic professional debridement.  Patient was hospitalized in August with COVID. She did to to rehab after discharge. She is back at home with family now.  PCP is Binnie Rail, MD , and last visit was 07/09/2021.  Allergies  Allergen Reactions   Lyrica [Pregabalin] Swelling    Caused weight gain   Amlodipine Swelling   Sulfonamide Derivatives Rash    Review of Systems: Negative except as noted in the HPI.   Objective:  Vascular Examination: CFT <3 seconds b/l LE. Palpable DP pulse(s) b/l lower extremities Palpable PT pulse(s) b/l lower extremities Pedal hair absent. Lower extremity skin temperature gradient within normal limits. No pain with calf compression b/l. No edema noted b/l LE.  Neurological Examination: Protective sensation decreased with 10 gram monofilament b/l. Vibratory sensation decreased b/l.  Dermatological Examination: Pedal integument with normal turgor, texture and tone BLE. No open wounds b/l LE. No interdigital macerations noted b/l LE. Toenails 1-5 b/l elongated, discolored, dystrophic, thickened, crumbly with subungual debris and tenderness to dorsal palpation. Hyperkeratotic lesion(s) plantar forefoot area.  No erythema, no edema, no drainage, no fluctuance.  Musculoskeletal Examination: Normal muscle strength 5/5 to all lower extremity muscle groups bilaterally. Hammertoe deformity noted 1-5 b/l. Utilizes rollator for ambulation  assistance.  Radiographs: None Assessment:   1. Pain due to onychomycosis of toenail   2. Callus   3. Diabetic peripheral neuropathy associated with type 2 diabetes mellitus ([place])    Plan:  -Examined patient. -Continue diabetic foot care principles: inspect feet daily, monitor glucose as recommended by PCP and/or Endocrinologist, and follow prescribed diet per PCP, Endocrinologist and/or dietician. -Toenails 1-5 left and 1-5 right debrided in length and girth without iatrogenic bleeding with sterile nail nipper and dremel.  -Callus(es) left foot pared utilizing sterile scalpel blade without complication or incident. Total number debrided =1. -Patient/POA to call should there be question/concern in the interim.  Return in about 3 months (around 11/23/2021).  Marzetta Board, DPM

## 2021-09-11 ENCOUNTER — Ambulatory Visit: Payer: Medicare Other

## 2021-09-11 ENCOUNTER — Telehealth: Payer: Self-pay | Admitting: Internal Medicine

## 2021-09-11 NOTE — Telephone Encounter (Signed)
Patient daughter Jessica [name] calling in  Patient fell this morning around 2:30 on her left side.. complaining of pain & soreness  Wants some advice of what they should do from nurse or provider  Has OV scheduled tomorrow w/ Dr. Jenny Reichmann for possible UTI/ Flu shot  Please call [phone]

## 2021-09-11 NOTE — Telephone Encounter (Signed)
Patient's daughter, [name] called back in reference to Mrs. Clay fall. States she is unsure she will be able to get the patient to the appointment tomorrow afternoon with Dr. Jenny Reichmann  Transferred call to team health due to concern of pain from patient's fall, and UTI and potential inability to get the patient to an appointment

## 2021-09-12 ENCOUNTER — Encounter: Payer: Self-pay | Admitting: Internal Medicine

## 2021-09-12 ENCOUNTER — Ambulatory Visit (INDEPENDENT_AMBULATORY_CARE_PROVIDER_SITE_OTHER): Payer: Medicare Other

## 2021-09-12 ENCOUNTER — Other Ambulatory Visit: Payer: Self-pay

## 2021-09-12 ENCOUNTER — Ambulatory Visit: Payer: Medicare Other | Admitting: Internal Medicine

## 2021-09-12 VITALS — BP 100/52 | HR 62 | Temp 97.6°F | Ht 64.0 in | Wt 151.0 lb

## 2021-09-12 DIAGNOSIS — R079 Chest pain, unspecified: Secondary | ICD-10-CM

## 2021-09-12 DIAGNOSIS — I10 Essential (primary) hypertension: Secondary | ICD-10-CM | POA: Diagnosis not present

## 2021-09-12 DIAGNOSIS — R300 Dysuria: Secondary | ICD-10-CM

## 2021-09-12 DIAGNOSIS — Z23 Encounter for immunization: Secondary | ICD-10-CM | POA: Diagnosis not present

## 2021-09-12 DIAGNOSIS — E1140 Type 2 diabetes mellitus with diabetic neuropathy, unspecified: Secondary | ICD-10-CM | POA: Diagnosis not present

## 2021-09-12 MED ORDER — CIPROFLOXACIN HCL 250 MG PO TABS: 250.0000 mg | ORAL_TABLET | Freq: Two times a day (BID) | ORAL | 0 refills | Status: DC

## 2021-09-12 NOTE — Telephone Encounter (Signed)
noted 

## 2021-09-12 NOTE — Patient Instructions (Signed)
Please take all new medication as prescribed - the antibiotic  Please continue all other medications as before, and refills have been done if requested.  Please have the pharmacy call with any other refills you may need.  Please keep your appointments with your specialists as you may have planned  Please go to the XRAY Department in the first floor for the x-ray testing  Please go to the LAB at the blood drawing area for the tests to be done  - just the urine testing today  You will be contacted by phone if any changes need to be made immediately.  Otherwise, you will receive a letter about your results with an explanation, but please check with MyChart first.  Please remember to sign up for MyChart if you have not done so, as this will be important to you in the future with finding out test results, communicating by private email, and scheduling acute appointments online when needed.

## 2021-09-12 NOTE — Progress Notes (Signed)
Patient ID: Jessica [name], female   DOB: September 05, 1927, 87 y.o.   MRN: [id number]        Chief Complaint: follow up dysuria with fall and left chest pain/bruising       HPI:  Jessica [name] is a 87 y.o. 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 fib on Eliquis, SSS s/p PPM, CKD stage IIIb, type 2 diabetes mellitus recent diet controlled, history of breast cancer, GERD, essential hypertension, hyperlipidemia  - was hospn aug 2022 with covid pa and UTI, then rehab stay now on home o2 2L qhs, here with daughter with 4 days onset dysuria and frequency, but this time trying to catch it earlier; seems to be maybe more severe than august actually however in terms of generalized weakness as she had a fall 2 days ago to left lateral chest in the BR with bruising and persistent at least mod pleuritic pain, constant sharp, worse to breathe or turn over in bed, but Denies urinary symptoms such as urgency, flank pain, hematuria or n/v, fever, chills, and confusion over baseline.  Coincidently, the day she fell was her last day of PT.         Wt Readings from Last 3 Encounters:  09/12/21 151 lb (68.5 kg)  07/09/21 151 lb (68.5 kg)  07/04/21 153 lb 1.6 oz (69.4 kg)   BP Readings from Last 3 Encounters:  09/12/21 (!) 100/52  07/09/21 108/60  07/05/21 102/62         Past Medical History:  Diagnosis Date   Breast cancer ([place])    CAD (coronary artery disease)    a. s/p DESx2 to mid and distal RCA in 2010.   Cholelithiasis 09/12/2013   Lap Chole on 09/14/13    Chronic combined systolic and diastolic CHF (congestive heart failure) ([place])    a. Echo 06/09/15: EF 55-60% -> 10/2017 EF 30-35% (pt and daughter did not wish to proceed with ischemic assessment - conservative rx).   CKD (chronic kidney disease), stage III (HCC)    Diabetes mellitus    NON INSULIN DEPENDENT   DJD (degenerative joint disease)     GERD (gastroesophageal reflux disease)    Hemorrhoids    History of diabetic neuropathy    Hypercholesterolemia    Hypertension    Hypertensive heart disease    Permanent atrial fibrillation ([place])    a. discovered on PPM interrogation 12/15- > eventually progressed to 100% atrial fib burden/permanent atrial fib.   SSS (sick sinus syndrome) (HCC)    a. s/p [employer]tr[employer] PPM by JA for SSS and syncope 9/11.   Past Surgical History:  Procedure Laterality Date   CARDIAC CATHETERIZATION  07/05/2009   EF 55-60%   CARDIOVERSION N/A 06/11/2015   Procedure: CARDIOVERSION;  Surgeon: [name], MD;  Location: [facility];  Service: Cardiovascular;  Laterality: N/A;   CHOLECYSTECTOMY N/A 09/14/2013   Procedure: LAPAROSCOPIC CHOLECYSTECTOMY WITH INTRAOPERATIVE CHOLANGIOGRAM;  Surgeon: [name], MD;  Location: [facility];  Service: General;  Laterality: N/A;   COLONOSCOPY     ERCP N/A 09/13/2013   Procedure: ENDOSCOPIC RETROGRADE CHOLANGIOPANCREATOGRAPHY (ERCP);  Surgeon: [name], MD;  Location: [facility]COPY;  Service: Endoscopy;  Laterality: N/A;   INSERT / REPLACE / REMOVE PACEMAKER  9/11   SSS and syncope, implanted by JA (MDT)   [place]    reports that she has  never smoked. She has never used smokeless tobacco. She reports that she does not drink alcohol and does not use drugs. family history includes Cancer in her father and mother. Allergies  Allergen Reactions   Lyrica [Pregabalin] Swelling    Caused weight gain   Amlodipine Swelling   Sulfonamide Derivatives Rash   Current Outpatient Medications on File Prior to Visit  Medication Sig Dispense Refill   acetaminophen (TYLENOL) 325 MG tablet Take 650 mg by mouth every 6 (six) hours as needed for fever or mild pain.     apixaban (ELIQUIS) 5 MG TABS tablet Take 1 tablet (5 mg total) by mouth 2 (two) times daily. 60 tablet 0   b complex vitamins tablet Take 1 tablet by mouth daily.      bisacodyl (DULCOLAX) 10 MG suppository Place 10 mg rectally as needed for moderate constipation.     carvedilol (COREG) 25 MG tablet Take 1 tablet (25 mg total) by mouth 2 (two) times daily. 60 tablet 0   docusate sodium (COLACE) 100 MG capsule Take 100 mg by mouth at bedtime.     fexofenadine (ALLEGRA) 180 MG tablet Take 1 tablet (180 mg total) by mouth daily as needed for allergies. 30 tablet 0   fish oil-omega-3 fatty acids 1000 MG capsule Take 1 g by mouth every morning.     furosemide (LASIX) 20 MG tablet Take 1 tablet (20 mg total) by mouth daily. TAKE 2 TABLETS BY MOUTH IN THE MORNING, AND  1 TABLET IN THE AFTERNOON 90 tablet 0   gabapentin (NEURONTIN) 300 MG capsule Take 1 capsule (300 mg total) by mouth 2 (two) times daily. 60 capsule 0   guaiFENesin (ROBITUSSIN) 100 MG/5ML liquid Give 10 ml by mouth three times a day for 2 weeks     hydrALAZINE (APRESOLINE) 25 MG tablet Take 1 tablet (25 mg total) by mouth every 8 (eight) hours. 90 tablet 0   linagliptin (TRADJENTA) 5 MG TABS tablet Take 1 tablet (5 mg total) by mouth daily. 30 tablet 0   lisinopril (ZESTRIL) 5 MG tablet Take 7.5 mg by mouth daily.     Magnesium Hydroxide (MILK OF MAGNESIA PO) Take 30 mLs by mouth as needed.     nitroGLYCERIN (NITROSTAT) 0.4 MG SL tablet Place 1 tablet (0.4 mg total) under the tongue every 5 (five) minutes as needed for chest pain. x3 doses as needed for chest pain 25 tablet 0   NON FORMULARY Regular Diet     potassium chloride SA (KLOR-CON M20) 20 MEQ tablet Take 1 tablet (20 mEq total) by mouth daily. 30 tablet 0   Sodium Phosphates (RA SALINE ENEMA RE) Place 1 Dose rectally as needed.     No current facility-administered medications on file prior to visit.        ROS:  All others reviewed and negative.  Objective        PE:  BP (!) 100/52 (BP Location: Left Arm, Patient Position: Sitting, Cuff Size: Large)   Pulse 62   Temp 97.6 F (36.4 C) (Oral)   Ht 5\' 4"  (1.626 m)   Wt 151 lb (68.5 kg)    LMP  (LMP Unknown)   SpO2 (!) 62%   BMI 25.92 kg/m                 Constitutional: Pt appears in NAD               HENT: Head: NCAT.  Right Ear: External ear normal.                 Left Ear: External ear normal.                Eyes: . Pupils are equal, round, and reactive to light. Conjunctivae and EOM are normal               Nose: without d/c or deformity               Neck: Neck supple. Gross normal ROM               Cardiovascular: Normal rate and regular rhythm.  ; left lateral chest wall with faint but about 2 x 4 cm area bruising with little swelling, no abrasion or redness, but tender throughout located approx t6-7-8 dermatomal levels               Pulmonary/Chest: Effort normal and breath sounds without rales or wheezing.                Abd:  Soft, mild low abd tender, no guarding or rebound, ND, + BS, no organomegaly               Neurological: Pt is alert. At baseline orientation, motor grossly intact               Skin: Skin is warm. No rashes, no other new lesions, LE edema - trace bilat               Psychiatric: Pt behavior is normal without agitation   Micro: none  Cardiac tracings I have personally interpreted today:  none  Pertinent Radiological findings (summarize): none   Lab Results  Component Value Date   WBC 12.8 06/18/2021   HGB 14.3 06/18/2021   HCT 43 06/18/2021   PLT 149 (A) 06/18/2021   GLUCOSE 189 (H) 06/14/2021   CHOL 129 10/19/2019   TRIG 75.0 10/19/2019   HDL 41.60 10/19/2019   LDLDIRECT 152.0 10/10/2013   LDLCALC 73 10/19/2019   ALT 34 06/14/2021   AST 29 06/14/2021   NA 136 (A) 06/18/2021   K 4.2 06/18/2021   CL 99 06/18/2021   CREATININE 1.1 06/18/2021   BUN 39 (A) 06/18/2021   CO2 25 (A) 06/18/2021   TSH 4.270 09/03/2020   INR 0.95 07/15/2010   HGBA1C 6.8 (H) 06/10/2021   Assessment/Plan:  Jessica [name] is a 87 y.o. White or Caucasian [1] female with  has a past medical history of Breast cancer ([place]), CAD (coronary  artery disease), Cholelithiasis (09/12/2013), Chronic combined systolic and diastolic CHF (congestive heart failure) ([place]), CKD (chronic kidney disease), stage III ([place]), Diabetes mellitus, DJD (degenerative joint disease), GERD (gastroesophageal reflux disease), Hemorrhoids, History of diabetic neuropathy, Hypercholesterolemia, Hypertension, Hypertensive heart disease, Permanent atrial fibrillation ([place]), and SSS (sick sinus syndrome) ([place]).  Dysuria Pt with high suspicion for recurrent uti - for urine and culture,  and empiric cipro course with f/u culture, consider preventive antibx in future  Chest pain With hx of recent covid pna, now more localized left lateral pleuritic pain and bruising post fall - suspect rib fx - for cxr, and rib films, pt and daughter decline other pain med but ok for tylenol prn  Type 2 diabetes mellitus with diabetic neuropathy, unspecified ([place]) Lab Results  Component Value Date   HGBA1C 6.8 (H) 06/10/2021   Mild uncontrolled but acceptable for age, pt to continue current medical treatment  tradjenta   Hypertension BP Readings from Last 3 Encounters:  09/12/21 (!) 100/52  07/09/21 108/60  07/05/21 102/62   Pt with chronically low normal BP, ok for pt to continue medical treatment coreg, hydralzaine, lasix but may need to revisit for further falls  Followup: Return if symptoms worsen or fail to improve.  Cathlean Cower, MD 09/15/2021 6:45 AM [facility]

## 2021-09-13 ENCOUNTER — Telehealth: Payer: Self-pay | Admitting: Internal Medicine

## 2021-09-13 ENCOUNTER — Ambulatory Visit: Payer: Medicare Other | Admitting: Internal Medicine

## 2021-09-13 ENCOUNTER — Encounter: Payer: Self-pay | Admitting: Internal Medicine

## 2021-09-13 LAB — URINALYSIS, ROUTINE W REFLEX MICROSCOPIC
Bilirubin Urine: NEGATIVE
Hgb urine dipstick: NEGATIVE
Ketones, ur: NEGATIVE
Nitrite: POSITIVE — AB
Specific Gravity, Urine: 1.015 (ref 1.000–1.030)
Total Protein, Urine: NEGATIVE
Urine Glucose: NEGATIVE
Urobilinogen, UA: 0.2 (ref 0.0–1.0)
pH: 6 (ref 5.0–8.0)

## 2021-09-13 NOTE — Telephone Encounter (Signed)
X-ray reviewed with patient's daughter. Will contact patient's daughter once urine results are resulted.

## 2021-09-13 NOTE — Telephone Encounter (Signed)
[facility] FYI...   Caller is from the office and needs a patient triaged. The patient fell early this morning and she is in pain and she has a possible UTI and she scheduled for tomorrow but Dtr is not sure if she can get her to appt. The office wants her triaged. She has Sx of pain on left side of Abd but Dtr does not feel anything out of place. No cuts/scrapes from the fall; fell in the bathroom. Was able to stand and pivot to the bed after falling.  Advised to see PCP within 24 hrs. Caller understood and agreed.

## 2021-09-13 NOTE — Telephone Encounter (Signed)
Patient's daughter [name] is requesting a call back to discuss patient's labs and xray results

## 2021-09-15 ENCOUNTER — Encounter: Payer: Self-pay | Admitting: Internal Medicine

## 2021-09-15 DIAGNOSIS — R300 Dysuria: Secondary | ICD-10-CM | POA: Insufficient documentation

## 2021-09-15 DIAGNOSIS — R079 Chest pain, unspecified: Secondary | ICD-10-CM | POA: Insufficient documentation

## 2021-09-15 LAB — URINE CULTURE

## 2021-09-15 NOTE — Assessment & Plan Note (Addendum)
Pt with high suspicion for recurrent uti - for urine and culture,  and empiric cipro course with f/u culture, consider preventive antibx in future

## 2021-09-15 NOTE — Assessment & Plan Note (Signed)
Lab Results  Component Value Date   HGBA1C 6.8 (H) 06/10/2021   Mild uncontrolled but acceptable for age, pt to continue current medical treatment tradjenta

## 2021-09-15 NOTE — Assessment & Plan Note (Signed)
With hx of recent covid pna, now more localized left lateral pleuritic pain and bruising post fall - suspect rib fx - for cxr, and rib films, pt and daughter decline other pain med but ok for tylenol prn

## 2021-09-15 NOTE — Assessment & Plan Note (Signed)
BP Readings from Last 3 Encounters:  09/12/21 (!) 100/52  07/09/21 108/60  07/05/21 102/62   Pt with chronically low normal BP, ok for pt to continue medical treatment coreg, hydralzaine, lasix but may need to revisit for further falls

## 2021-09-16 ENCOUNTER — Telehealth: Payer: Self-pay | Admitting: Internal Medicine

## 2021-09-16 ENCOUNTER — Other Ambulatory Visit: Payer: Self-pay | Admitting: Internal Medicine

## 2021-09-16 DIAGNOSIS — E1140 Type 2 diabetes mellitus with diabetic neuropathy, unspecified: Secondary | ICD-10-CM

## 2021-09-16 DIAGNOSIS — W19XXXD Unspecified fall, subsequent encounter: Secondary | ICD-10-CM

## 2021-09-16 DIAGNOSIS — S2232XD Fracture of one rib, left side, subsequent encounter for fracture with routine healing: Secondary | ICD-10-CM

## 2021-09-16 DIAGNOSIS — I5042 Chronic combined systolic (congestive) and diastolic (congestive) heart failure: Secondary | ICD-10-CM

## 2021-09-16 MED ORDER — TRAMADOL HCL 50 MG PO TABS: 50.0000 mg | ORAL_TABLET | Freq: Four times a day (QID) | ORAL | 0 refills | Status: DC | PRN

## 2021-09-16 NOTE — Addendum Note (Signed)
Addended by: Biagio Borg on: 09/16/2021 05:17 PM   Modules accepted: Orders

## 2021-09-16 NOTE — Telephone Encounter (Signed)
Ok I resent the rx for the antibiotic  Also ok for tramadol prn pain  Normally binding ribs is not a good idea as this promotes pneumonia and does not help the healing, which often takes at least 4-6 wks

## 2021-09-16 NOTE — Telephone Encounter (Signed)
Ok I ordered the [facility] with RN and PT per [facility]

## 2021-09-16 NOTE — Telephone Encounter (Signed)
Update prescription (request sent over by pharmacy)  [facility] as the patient hasn't begun the medicine for UTI yet  Tylenol 650 mg 2 each, twice daily  Is there something stronger? Will it affect her gabapentin that she takes daily  What else can be done for the rib, patient has not moved from the bed since Thursday 11.10.22  Can wrap a ace bandage around her ribs?  Please call [name] at [phone]

## 2021-09-16 NOTE — Telephone Encounter (Signed)
Patient's daughter notified. Patient's daughter inquiring if patient needs more in home care and therapy since she is unable to move due to injury. Patient has done therapy with [facility] in the past

## 2021-09-18 ENCOUNTER — Telehealth: Payer: Self-pay | Admitting: Internal Medicine

## 2021-09-18 ENCOUNTER — Other Ambulatory Visit: Payer: Self-pay | Admitting: Internal Medicine

## 2021-09-18 MED ORDER — CEPHALEXIN 500 MG PO CAPS
500.0000 mg | ORAL_CAPSULE | Freq: Three times a day (TID) | ORAL | 0 refills | Status: AC
Start: 2021-09-18 — End: 2021-09-28

## 2021-09-18 NOTE — Telephone Encounter (Signed)
Patient daughter [name] calling in  Patient requesting call back from [facility] patient is not reacting well to the antibiotic & wants to speak w/ nurse about it  Please call [phone]

## 2021-09-18 NOTE — Telephone Encounter (Signed)
This type of emotional issue is unlikely to be related to the cipro since I dont believe I have seen this in the past 30 yrs of treatment of many persons with cipro.   Remember side effects listed for medications are listing anything seen with patient on these medications and may not actually be from the medication   It is much more likely as in most patients to be due to the effect of the illness and UTI itself and is very common in other patients as well  I think she can take this medication fine, but given all the resistance and now delay in treatment and pt possibly getting worse, I will change to cephalexin - done erx

## 2021-09-18 NOTE — Telephone Encounter (Signed)
Patient's daughter states that patient has become disoriented and angry. Per patient's daughter, also has been violent and "belligerent". Explained to patient's daughter that UTI's in elderly can cause those symptoms. Patient's daughter thinks that the antibiotic is the cause of patient's behavior and per google, those are side effects of Cipro. Please advise if patient needs different antibiotic.

## 2021-09-18 NOTE — Telephone Encounter (Signed)
Patient's daughter notified.

## 2021-09-23 ENCOUNTER — Ambulatory Visit (HOSPITAL_BASED_OUTPATIENT_CLINIC_OR_DEPARTMENT_OTHER): Payer: Medicare Other | Admitting: Family

## 2021-09-23 ENCOUNTER — Telehealth: Payer: Self-pay | Admitting: Internal Medicine

## 2021-09-23 NOTE — Telephone Encounter (Signed)
[place] Name: [facility] Agency Name: Moreen Fowler Phone #: [phone] Service Requested: PT (examples: OT/PT/Skilled Nursing/Social Work/Speech Therapy/Wound Care) Frequency of Visits: 1wk/1x, 5wk/2x, 1wk/1x

## 2021-09-24 ENCOUNTER — Other Ambulatory Visit: Payer: Self-pay | Admitting: Internal Medicine

## 2021-09-24 DIAGNOSIS — I5042 Chronic combined systolic (congestive) and diastolic (congestive) heart failure: Secondary | ICD-10-CM

## 2021-09-24 NOTE — Telephone Encounter (Signed)
Verbals left on voicemail today.

## 2021-10-01 ENCOUNTER — Telehealth: Payer: Self-pay | Admitting: Internal Medicine

## 2021-10-01 MED ORDER — TRAMADOL HCL 50 MG PO TABS: 50.0000 mg | ORAL_TABLET | Freq: Four times a day (QID) | ORAL | 0 refills | Status: DC | PRN

## 2021-10-01 NOTE — Telephone Encounter (Signed)
Assuming this is something that is new or has her blood pressure been this low.  Is or any signs or symptoms of infection/sepsis.  Hold hydralazine for SBP less than 120  Call back with other blood pressure readings and any other concerning symptoms.

## 2021-10-01 NOTE — Telephone Encounter (Signed)
Sharee Pimple from [facility] calling in  [place] she wanted to make provider aware that patients BP was low today 81/55  Cb if needed [phone]

## 2021-10-01 NOTE — Telephone Encounter (Signed)
sent 

## 2021-10-02 ENCOUNTER — Telehealth: Payer: Self-pay | Admitting: Internal Medicine

## 2021-10-02 NOTE — Telephone Encounter (Signed)
ok 

## 2021-10-02 NOTE — Telephone Encounter (Signed)
Verbals given today. °

## 2021-10-02 NOTE — Telephone Encounter (Signed)
Home Health verbal orders-caller/Agency: [facility]/ Myrtie Cruise number: [phone]  Requesting OT/PT/Skilled nursing/Social Work/Speech: Social Work  Reason: Dance movement psychotherapist stated patient's daughter is requesting a Education officer, museum to visit patient in home.

## 2021-10-09 ENCOUNTER — Ambulatory Visit (INDEPENDENT_AMBULATORY_CARE_PROVIDER_SITE_OTHER): Payer: Medicare Other

## 2021-10-09 DIAGNOSIS — I495 Sick sinus syndrome: Secondary | ICD-10-CM | POA: Diagnosis not present

## 2021-10-11 LAB — CUP PACEART REMOTE DEVICE CHECK
Battery Impedance: 2079 Ohm
Battery Remaining Longevity: 35 mo
Battery Voltage: 2.75 V
Brady Statistic AP VP Percent: 0 %
Brady Statistic AP VS Percent: 39 %
Brady Statistic AS VP Percent: 20 %
Brady Statistic AS VS Percent: 42 %
Date Time Interrogation Session: 20221208110923
Implantable Lead Implant Date: 20110912
Implantable Lead Implant Date: 20110912
Implantable Lead Location: 753859
Implantable Lead Location: 753860
Implantable Lead Model: 5076
Implantable Lead Model: 5092
Implantable Pulse Generator Implant Date: 20110912
Lead Channel Impedance Value: 625 Ohm
Lead Channel Impedance Value: 664 Ohm
Lead Channel Pacing Threshold Amplitude: 0.5 V
Lead Channel Pacing Threshold Amplitude: 0.625 V
Lead Channel Pacing Threshold Pulse Width: 0.4 ms
Lead Channel Pacing Threshold Pulse Width: 0.4 ms
Lead Channel Setting Pacing Amplitude: 2 V
Lead Channel Setting Pacing Amplitude: 2.5 V
Lead Channel Setting Pacing Pulse Width: 0.4 ms
Lead Channel Setting Sensing Sensitivity: 2 mV

## 2021-10-18 NOTE — Progress Notes (Signed)
Remote pacemaker transmission.   

## 2021-11-06 ENCOUNTER — Telehealth: Payer: Self-pay | Admitting: Internal Medicine

## 2021-11-06 NOTE — Telephone Encounter (Signed)
Called [place] and left message - advised to hold hydralazine.  Monitor BP closely.  Call us tomorrow with update in pressures.

## 2021-11-06 NOTE — Telephone Encounter (Signed)
Jessica [name] calling in from [place] in to notify provider of patient bp  86/50 when patient laying down in bed 64/48 patient sitting up on edge of bed  CB if needed [phone]

## 2021-11-13 ENCOUNTER — Telehealth: Payer: Self-pay | Admitting: Internal Medicine

## 2021-11-13 NOTE — Telephone Encounter (Signed)
Monique from [facility] has called and is requesting to continue to see pt for 2x week- 5 weeks, for strength, gait balance training, and home safety.    Callback #- [phone]

## 2021-11-14 ENCOUNTER — Ambulatory Visit: Payer: Medicare Other | Admitting: Cardiology

## 2021-11-14 NOTE — Telephone Encounter (Signed)
Verbals given to [place] today.

## 2021-11-15 ENCOUNTER — Telehealth: Payer: Self-pay | Admitting: Internal Medicine

## 2021-11-15 NOTE — Telephone Encounter (Signed)
I believe we already discontinued the hydralazine.  Stop lisinopril, stop Lasix, decrease carvedilol to 12.5 mg twice daily.  If blood pressure continues to be low stop the carvedilol.

## 2021-11-15 NOTE — Telephone Encounter (Signed)
Called and left message for patient's daughter with recommendations.

## 2021-11-15 NOTE — Telephone Encounter (Signed)
Patient's daughter [name] requesting a call back to discuss patient's bp

## 2021-11-19 ENCOUNTER — Telehealth: Payer: Self-pay

## 2021-11-19 NOTE — Telephone Encounter (Signed)
Spoke with patient today.  Appointment made for 2:40 on Friday.  Move to 3:40 pm for patient.

## 2021-11-19 NOTE — Telephone Encounter (Signed)
Jessica [name] is calling to report BP reading 89/56 sitting in recliner. When patient stood up the reading was 96/63.  Please call [name] if any changes need to be made [phone]

## 2021-11-21 ENCOUNTER — Encounter: Payer: Self-pay | Admitting: Internal Medicine

## 2021-11-21 NOTE — Telephone Encounter (Signed)
ok 

## 2021-11-21 NOTE — Telephone Encounter (Signed)
[place] Name: [place] Name: [place] Phone #: [phone] Service Requested: verbal order for OT (plan of care approval) Frequency of Visits: 1 week 2 2 week 2 1 week 2

## 2021-11-21 NOTE — Progress Notes (Signed)
Subjective:    Patient ID: Jessica [name], female    DOB: June 12, 1927, 88 y.o.   MRN: [id number]  This visit occurred during the SARS-CoV-2 public health emergency.  Safety protocols were in place, including screening questions prior to the visit, additional usage of staff PPE, and extensive cleaning of exam room while observing appropriate contact time as indicated for disinfecting solutions.    HPI The patient is here for an acute visit.  She is here with her daughter.   Not needing oxygen since 12/3.  Appetie still good.   BP just drops sometimes.  When needing to have a BM - BP drops - 61/42 -occurred on an 12th - after having a BM the BP did go back up  121/59.  That has only happened a couple of times.  We have decreased many of her medication in the past couple of weeks due to her low BPs.     At times she has mild wheeze.  She has no edema.  She does have mild SOB.    No longer taking lasix.      Medications and allergies reviewed with patient and updated if appropriate.  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 08/23/2021   Atrial fibrillation ([place]) 07/09/2021   Bed sore 07/09/2021   Acute hypoxemic respiratory failure due to COVID-19 [facility]) 06/10/2021   Acute respiratory failure with hypoxia (HCC) 06/10/2021   Abdominal bloating 03/09/2020   Chronic constipation 03/09/2020   H/O bilateral mastectomy 10/13/2018   HX: breast cancer 10/13/2018   Cardiac pacemaker in situ    Chronic combined systolic and diastolic CHF (congestive heart failure) ([place]) 10/12/2017   UTI (urinary tract infection) 03/28/2017   Acute-on-chronic kidney injury ([place]) 03/28/2017   Bilateral leg edema 04/29/2016   CKD (chronic kidney disease) stage 3, GFR 30-59 ml/min (HCC) 06/10/2015   CAD  (coronary artery disease) 06/09/2015   Diabetic neuropathy ([place]) 07/06/2013   SSS (sick sinus syndrome) ([place]) 02/10/2013   Hypertension 07/30/2012   Pacemaker-Medtronic 07/19/2012   Benign hypertensive heart disease with heart failure ([place]) 01/28/2012   Type 2 diabetes mellitus with diabetic neuropathy, unspecified ([place]) 10/22/2010   HYPERCHOLESTEROLEMIA 10/22/2010   DEGENERATIVE JOINT DISEASE 10/22/2010    Current Outpatient Medications on File Prior to Visit  Medication Sig Dispense Refill   acetaminophen (TYLENOL) 325 MG tablet Take 650 mg by mouth every 6 (six) hours as needed for fever or mild pain.     apixaban (ELIQUIS) 5 MG TABS tablet Take 1 tablet (5 mg total) by mouth 2 (two) times daily. 60 tablet 0   b complex vitamins tablet Take 1 tablet by mouth daily.     bisacodyl (DULCOLAX) 10 MG suppository Place 10 mg rectally as needed for moderate constipation.     Bisacodyl (LAXATIVE PO) Take by mouth as needed.     carvedilol (COREG) 25 MG tablet Take 1 tablet (25 mg total) by mouth 2 (two) times daily. (Patient taking differently: Take 25 mg by mouth 2 (two) times daily. Patient taking 12.5 two times daily) 60 tablet 0   docusate sodium (COLACE) 100 MG capsule Take 100 mg by mouth at bedtime.     fish oil-omega-3 fatty acids 1000 MG capsule Take 1 g by mouth every  morning.     gabapentin (NEURONTIN) 100 MG capsule Take 100 mg by mouth at bedtime.     gabapentin (NEURONTIN) 300 MG capsule TAKE 1 CAPSULE BY MOUTH THREE TIMES DAILY 270 capsule 2   linagliptin (TRADJENTA) 5 MG TABS tablet Take 1 tablet (5 mg total) by mouth daily. 30 tablet 0   Magnesium Hydroxide (MILK OF MAGNESIA PO) Take 30 mLs by mouth as needed.     nitroGLYCERIN (NITROSTAT) 0.4 MG SL tablet Place 1 tablet (0.4 mg total) under the tongue every 5 (five) minutes as needed for chest pain. x3 doses as needed for chest pain 25 tablet 0   NON FORMULARY Regular Diet     potassium chloride SA (KLOR-CON) 20 MEQ tablet  TAKE 1  BY MOUTH ONCE DAILY 90 tablet 0   Sodium Phosphates (RA SALINE ENEMA RE) Place 1 Dose rectally as needed.     fexofenadine (ALLEGRA) 180 MG tablet Take 1 tablet (180 mg total) by mouth daily as needed for allergies. (Patient not taking: Reported on 11/22/2021) 30 tablet 0   furosemide (LASIX) 20 MG tablet Take 1 tablet (20 mg total) by mouth daily. TAKE 2 TABLETS BY MOUTH IN THE MORNING, AND  1 TABLET IN THE AFTERNOON (Patient not taking: Reported on 11/22/2021) 90 tablet 0   guaiFENesin (ROBITUSSIN) 100 MG/5ML liquid Give 10 ml by mouth three times a day for 2 weeks (Patient not taking: Reported on 11/22/2021)     lisinopril (ZESTRIL) 5 MG tablet Take 7.5 mg by mouth daily. (Patient not taking: Reported on 11/22/2021)     traMADol (ULTRAM) 50 MG tablet Take 1 tablet (50 mg total) by mouth every 6 (six) hours as needed. (Patient not taking: Reported on 11/22/2021) 30 tablet 0   No current facility-administered medications on file prior to visit.    Past Medical History:  Diagnosis Date   Breast cancer ([place])    CAD (coronary artery disease)    a. s/p DESx2 to mid and distal RCA in 2010.   Cholelithiasis 09/12/2013   Lap Chole on 09/14/13    Chronic combined systolic and diastolic CHF (congestive heart failure) ([place])    a. Echo 06/09/15: EF 55-60% -> 10/2017 EF 30-35% (pt and daughter did not wish to proceed with ischemic assessment - conservative rx).   CKD (chronic kidney disease), stage III (HCC)    Diabetes mellitus    NON INSULIN DEPENDENT   DJD (degenerative joint disease)    GERD (gastroesophageal reflux disease)    Hemorrhoids    History of diabetic neuropathy    Hypercholesterolemia    Hypertension    Hypertensive heart disease    Permanent atrial fibrillation ([place])    a. discovered on PPM interrogation 12/15- > eventually progressed to 100% atrial fib burden/permanent atrial fib.   SSS (sick sinus syndrome) (HCC)    a. s/p Medtronic PPM by JA for SSS and syncope 9/11.     Past Surgical History:  Procedure Laterality Date   CARDIAC CATHETERIZATION  07/05/2009   EF 55-60%   CARDIOVERSION N/A 06/11/2015   Procedure: CARDIOVERSION;  Surgeon: [name], MD;  Location: [place];  Service: Cardiovascular;  Laterality: N/A;   CHOLECYSTECTOMY N/A 09/14/2013   Procedure: LAPAROSCOPIC CHOLECYSTECTOMY WITH INTRAOPERATIVE CHOLANGIOGRAM;  Surgeon: [name], MD;  Location: [place];  Service: General;  Laterality: N/A;   COLONOSCOPY     ERCP N/A 09/13/2013   Procedure: ENDOSCOPIC RETROGRADE CHOLANGIOPANCREATOGRAPHY (ERCP);  Surgeon: [name], MD;  Location:  [facility];  Service: Endoscopy;  Laterality: N/A;   INSERT / REPLACE / REMOVE PACEMAKER  9/11   SSS and syncope, implanted by JA (MDT)   MASTECTOMY  1992   BILATERAL WITH RECONSTRUCTION    Social History   Socioeconomic History   Marital status: Widowed    Spouse name: Not on file   Number of children: Not on file   Years of education: Not on file   Highest education level: Not on file  Occupational History   Not on file  Tobacco Use   Smoking status: Never   Smokeless tobacco: Never  Vaping Use   Vaping Use: Never used  Substance and Sexual Activity   Alcohol use: No    Alcohol/week: 0.0 standard drinks   Drug use: No   Sexual activity: Not on file  Other Topics Concern   Not on file  Social History Narrative   Not on file   Social Determinants of Health   Financial Resource Strain: Not on file  Food Insecurity: Not on file  Transportation Needs: Not on file  Physical Activity: Not on file  Stress: Not on file  Social Connections: Not on file    Family History  Problem Relation Age of Onset   Cancer Mother    Cancer Father     Review of Systems  Constitutional:  Negative for fever.  Respiratory:  Positive for shortness of breath (mild). Negative for cough and wheezing.   Cardiovascular:  Negative for chest pain, palpitations and leg swelling.   Gastrointestinal:  Negative for abdominal pain, constipation, diarrhea and nausea.  Musculoskeletal:  Positive for gait problem.  Neurological:  Negative for light-headedness and headaches.      Objective:   Vitals:   11/22/21 1543  BP: 112/64  Pulse: 72  Temp: 98.1 F (36.7 C)  SpO2: 94%   BP Readings from Last 3 Encounters:  11/22/21 112/64  09/12/21 (!) 100/52  07/09/21 108/60   Wt Readings from Last 3 Encounters:  11/22/21 146 lb 3.2 oz (66.3 kg)  09/12/21 151 lb (68.5 kg)  07/09/21 151 lb (68.5 kg)   Body mass index is 25.1 kg/m.   Physical Exam    Constitutional: Appears well-developed and well-nourished. No distress.  Head: Normocephalic and atraumatic.  Neck: Neck supple. No tracheal deviation present. No thyromegaly present.  No cervical lymphadenopathy Cardiovascular: Normal rate, regular rhythm and normal heart sounds.  No murmur heard. No carotid bruit .  No edema Pulmonary/Chest: Effort normal and breath sounds normal. No respiratory distress. No has no wheezes. No rales.  Abdomen: soft, NT, ND Skin: Skin is warm and dry. Not diaphoretic.  Psychiatric: Normal mood and affect. Behavior is normal.       Assessment & Plan:    See Problem List for Assessment and Plan of chronic medical problems.

## 2021-11-22 ENCOUNTER — Ambulatory Visit: Payer: Medicare Other | Admitting: Internal Medicine

## 2021-11-22 ENCOUNTER — Other Ambulatory Visit: Payer: Self-pay

## 2021-11-22 VITALS — BP 112/64 | HR 72 | Temp 98.1°F | Ht 64.0 in | Wt 146.2 lb

## 2021-11-22 DIAGNOSIS — E0842 Diabetes mellitus due to underlying condition with diabetic polyneuropathy: Secondary | ICD-10-CM

## 2021-11-22 DIAGNOSIS — I5042 Chronic combined systolic (congestive) and diastolic (congestive) heart failure: Secondary | ICD-10-CM | POA: Diagnosis not present

## 2021-11-22 DIAGNOSIS — E1140 Type 2 diabetes mellitus with diabetic neuropathy, unspecified: Secondary | ICD-10-CM | POA: Diagnosis not present

## 2021-11-22 DIAGNOSIS — I10 Essential (primary) hypertension: Secondary | ICD-10-CM

## 2021-11-22 DIAGNOSIS — I480 Paroxysmal atrial fibrillation: Secondary | ICD-10-CM

## 2021-11-22 DIAGNOSIS — N1832 Chronic kidney disease, stage 3b: Secondary | ICD-10-CM

## 2021-11-22 MED ORDER — SERTRALINE HCL 25 MG PO TABS: 25.0000 mg | ORAL_TABLET | Freq: Every day | ORAL | 3 refills | Status: DC

## 2021-11-22 NOTE — Patient Instructions (Addendum)
° ° ° ° °  Blood work was ordered.       Medications changes include :   sertraline 25 mg daily

## 2021-11-22 NOTE — Telephone Encounter (Signed)
Message left for Jessica [name] today with verbals

## 2021-11-23 NOTE — Assessment & Plan Note (Signed)
Chronic BP has been dropping very low at times so medication has been decreaesd Continue coreg 12.5 mg bid  Monitor BP  Has cardiology f/u

## 2021-11-23 NOTE — Assessment & Plan Note (Signed)
Chronic Sounds to be in sinus rhythm Following with cardiology Continue eliquis 5 mg bid Continue coreg 12. 5 mg daily - had to decrease dose due to low BP Cbc, cmp

## 2021-11-23 NOTE — Assessment & Plan Note (Signed)
Chronic cmp 

## 2021-11-23 NOTE — Assessment & Plan Note (Signed)
Chronic B/l foot pain  Continue gabapentin 300 mg BID in day, 400 mg HS

## 2021-11-23 NOTE — Assessment & Plan Note (Signed)
Chronic Diet controlled a1c 

## 2021-11-23 NOTE — Assessment & Plan Note (Signed)
Chronic Appears to be euvolemic - no edema, lungs clear -- she does have SOB at times, but she is very deconditioned Not taking lasix now due to low BP -need to monitor status - may need lasix prn

## 2021-11-25 LAB — HEMOGLOBIN A1C: Hgb A1c MFr Bld: 6.7 % — ABNORMAL HIGH (ref 4.6–6.5)

## 2021-11-25 LAB — CBC WITH DIFFERENTIAL/PLATELET
Basophils Absolute: 0 10*3/uL (ref 0.0–0.1)
Basophils Relative: 0.5 % (ref 0.0–3.0)
Eosinophils Absolute: 0.1 10*3/uL (ref 0.0–0.7)
Eosinophils Relative: 1.1 % (ref 0.0–5.0)
HCT: 37.7 % (ref 36.0–46.0)
Hemoglobin: 12.6 g/dL (ref 12.0–15.0)
Lymphocytes Relative: 23.9 % (ref 12.0–46.0)
Lymphs Abs: 2.2 10*3/uL (ref 0.7–4.0)
MCHC: 33.4 g/dL (ref 30.0–36.0)
MCV: 88.2 fl (ref 78.0–100.0)
Monocytes Absolute: 0.6 10*3/uL (ref 0.1–1.0)
Monocytes Relative: 7 % (ref 3.0–12.0)
Neutro Abs: 6.1 10*3/uL (ref 1.4–7.7)
Neutrophils Relative %: 67.5 % (ref 43.0–77.0)
Platelets: 270 10*3/uL (ref 150.0–400.0)
RBC: 4.28 Mil/uL (ref 3.87–5.11)
RDW: 15.8 % — ABNORMAL HIGH (ref 11.5–15.5)
WBC: 9 10*3/uL (ref 4.0–10.5)

## 2021-11-25 LAB — BRAIN NATRIURETIC PEPTIDE: Pro B Natriuretic peptide (BNP): 860 pg/mL — ABNORMAL HIGH (ref 0.0–100.0)

## 2021-11-25 LAB — TSH: TSH: 4.96 u[IU]/mL (ref 0.35–5.50)

## 2021-11-26 LAB — COMPREHENSIVE METABOLIC PANEL
ALT: 10 U/L (ref 0–35)
AST: 15 U/L (ref 0–37)
Albumin: 3.4 g/dL — ABNORMAL LOW (ref 3.5–5.2)
Alkaline Phosphatase: 94 U/L (ref 39–117)
BUN: 24 mg/dL — ABNORMAL HIGH (ref 6–23)
CO2: 23 mEq/L (ref 19–32)
Calcium: 10.6 mg/dL — ABNORMAL HIGH (ref 8.4–10.5)
Chloride: 110 mEq/L (ref 96–112)
Creatinine, Ser: 1.06 mg/dL (ref 0.40–1.20)
GFR: 45.54 mL/min — ABNORMAL LOW (ref >60.00)
Glucose, Bld: 113 mg/dL — ABNORMAL HIGH (ref 70–99)
Potassium: 4.7 mEq/L (ref 3.5–5.1)
Sodium: 140 mEq/L (ref 135–145)
Total Bilirubin: 0.6 mg/dL (ref 0.2–1.2)
Total Protein: 6.3 g/dL (ref 6.0–8.3)

## 2021-11-27 ENCOUNTER — Telehealth: Payer: Self-pay | Admitting: *Deleted

## 2021-11-27 NOTE — Telephone Encounter (Signed)
Patient daughter, [name] called and stated that patient was Transferred from [facility] to [place] on 06/14/2021 by Ambulance.   Patient has a Emergency planning/management officer of [phone] and insurance will not pay unless they have a letter from Provider stating that it was Medically necessary. Daughter is stating that she needs a letter from Dr. Linna Darner or Jaymes Graff stating that it was medically necessary to transport patient from [facility] to [place].   Patient was DISCHARGE from [place] on 07/04/2021.  Please Advise.

## 2021-11-28 ENCOUNTER — Other Ambulatory Visit: Payer: Self-pay | Admitting: Internal Medicine

## 2021-11-28 MED ORDER — MIRTAZAPINE 7.5 MG PO TABS: 7.5000 mg | ORAL_TABLET | Freq: Every day | ORAL | 5 refills | Status: DC

## 2021-11-28 NOTE — Telephone Encounter (Signed)
Medina-Vargas, Monina C, NP  You 15 minutes ago (12:00 PM)   Hospital staff called the ambulance for patient to be transferred to [facility]. Hospital provider will need to write up reason for medical necessity.

## 2021-11-28 NOTE — Telephone Encounter (Signed)
Daughter Notified. Stated she will call the [facility]

## 2021-11-28 NOTE — Telephone Encounter (Signed)
LMOM to return call.

## 2021-12-05 ENCOUNTER — Telehealth: Payer: Self-pay | Admitting: Internal Medicine

## 2021-12-05 NOTE — Telephone Encounter (Signed)
Patient daughter [name] calling in  [place] says patient cough has not gotten any better & it sounds "deep into her chest"  Wants to know if possible to come & be seen in the morning?  Please call [phone]

## 2021-12-05 NOTE — Telephone Encounter (Signed)
BP is good - no changes.    Monitor cough - if worsens/does not improve or she develops any other symptoms they should let us know

## 2021-12-05 NOTE — Telephone Encounter (Signed)
She needs to be evaluated in person - does Judson Roch have anything ?    She should consider urgent care or other office if we can not get her in with sarah

## 2021-12-05 NOTE — Telephone Encounter (Signed)
Jessica [name] w/ [facility] informing provider pt's bp is outside of the set parameter at 121/53, caller states pt is asymptomatic and has been taking medications as prescribed  Caller states daughter informed her pt has had a cough for 2 days

## 2021-12-05 NOTE — Telephone Encounter (Signed)
Appointment made with Judson Roch and [facility] notified

## 2021-12-06 ENCOUNTER — Emergency Department (HOSPITAL_COMMUNITY): Payer: Medicare Other

## 2021-12-06 ENCOUNTER — Ambulatory Visit: Payer: Medicare Other | Admitting: Nurse Practitioner

## 2021-12-06 ENCOUNTER — Encounter: Payer: Self-pay | Admitting: Nurse Practitioner

## 2021-12-06 ENCOUNTER — Inpatient Hospital Stay (HOSPITAL_COMMUNITY)
Admission: EM | Admit: 2021-12-06 | Discharge: 2021-12-12 | DRG: 291 | Disposition: A | Discharge status: Hospice | Payer: Medicare Other | Attending: Internal Medicine | Admitting: Internal Medicine

## 2021-12-06 ENCOUNTER — Ambulatory Visit: Payer: Medicare Other | Admitting: Podiatry

## 2021-12-06 ENCOUNTER — Other Ambulatory Visit: Payer: Self-pay

## 2021-12-06 ENCOUNTER — Encounter (HOSPITAL_COMMUNITY): Payer: Self-pay

## 2021-12-06 VITALS — BP 135/78 | HR 110 | Temp 98.0°F

## 2021-12-06 DIAGNOSIS — S2249XA Multiple fractures of ribs, unspecified side, initial encounter for closed fracture: Secondary | ICD-10-CM | POA: Diagnosis present

## 2021-12-06 DIAGNOSIS — L89151 Pressure ulcer of sacral region, stage 1: Secondary | ICD-10-CM | POA: Diagnosis present

## 2021-12-06 DIAGNOSIS — I5020 Unspecified systolic (congestive) heart failure: Secondary | ICD-10-CM

## 2021-12-06 DIAGNOSIS — Z882 Allergy status to sulfonamides status: Secondary | ICD-10-CM

## 2021-12-06 DIAGNOSIS — I5023 Acute on chronic systolic (congestive) heart failure: Secondary | ICD-10-CM | POA: Diagnosis not present

## 2021-12-06 DIAGNOSIS — Z7189 Other specified counseling: Secondary | ICD-10-CM | POA: Diagnosis not present

## 2021-12-06 DIAGNOSIS — W19XXXA Unspecified fall, initial encounter: Secondary | ICD-10-CM | POA: Diagnosis present

## 2021-12-06 DIAGNOSIS — I4891 Unspecified atrial fibrillation: Secondary | ICD-10-CM

## 2021-12-06 DIAGNOSIS — I2510 Atherosclerotic heart disease of native coronary artery without angina pectoris: Secondary | ICD-10-CM | POA: Diagnosis present

## 2021-12-06 DIAGNOSIS — J9601 Acute respiratory failure with hypoxia: Secondary | ICD-10-CM | POA: Diagnosis present

## 2021-12-06 DIAGNOSIS — I5043 Acute on chronic combined systolic (congestive) and diastolic (congestive) heart failure: Secondary | ICD-10-CM | POA: Diagnosis present

## 2021-12-06 DIAGNOSIS — N183 Chronic kidney disease, stage 3 unspecified: Secondary | ICD-10-CM | POA: Diagnosis present

## 2021-12-06 DIAGNOSIS — J189 Pneumonia, unspecified organism: Secondary | ICD-10-CM | POA: Diagnosis present

## 2021-12-06 DIAGNOSIS — E7800 Pure hypercholesterolemia, unspecified: Secondary | ICD-10-CM | POA: Diagnosis present

## 2021-12-06 DIAGNOSIS — E1122 Type 2 diabetes mellitus with diabetic chronic kidney disease: Secondary | ICD-10-CM | POA: Diagnosis present

## 2021-12-06 DIAGNOSIS — S2242XA Multiple fractures of ribs, left side, initial encounter for closed fracture: Secondary | ICD-10-CM | POA: Diagnosis present

## 2021-12-06 DIAGNOSIS — R4189 Other symptoms and signs involving cognitive functions and awareness: Secondary | ICD-10-CM | POA: Diagnosis present

## 2021-12-06 DIAGNOSIS — Z7401 Bed confinement status: Secondary | ICD-10-CM

## 2021-12-06 DIAGNOSIS — I130 Hypertensive heart and chronic kidney disease with heart failure and stage 1 through stage 4 chronic kidney disease, or unspecified chronic kidney disease: Secondary | ICD-10-CM | POA: Diagnosis not present

## 2021-12-06 DIAGNOSIS — I495 Sick sinus syndrome: Secondary | ICD-10-CM

## 2021-12-06 DIAGNOSIS — Z515 Encounter for palliative care: Secondary | ICD-10-CM

## 2021-12-06 DIAGNOSIS — R413 Other amnesia: Secondary | ICD-10-CM | POA: Diagnosis present

## 2021-12-06 DIAGNOSIS — R0902 Hypoxemia: Secondary | ICD-10-CM

## 2021-12-06 DIAGNOSIS — I10 Essential (primary) hypertension: Secondary | ICD-10-CM | POA: Diagnosis present

## 2021-12-06 DIAGNOSIS — Z950 Presence of cardiac pacemaker: Secondary | ICD-10-CM | POA: Diagnosis not present

## 2021-12-06 DIAGNOSIS — Z6824 Body mass index (BMI) 24.0-24.9, adult: Secondary | ICD-10-CM

## 2021-12-06 DIAGNOSIS — I160 Hypertensive urgency: Secondary | ICD-10-CM

## 2021-12-06 DIAGNOSIS — Z888 Allergy status to other drugs, medicaments and biological substances status: Secondary | ICD-10-CM

## 2021-12-06 DIAGNOSIS — Z66 Do not resuscitate: Secondary | ICD-10-CM

## 2021-12-06 DIAGNOSIS — R778 Other specified abnormalities of plasma proteins: Secondary | ICD-10-CM | POA: Diagnosis not present

## 2021-12-06 DIAGNOSIS — R64 Cachexia: Secondary | ICD-10-CM | POA: Diagnosis present

## 2021-12-06 DIAGNOSIS — E871 Hypo-osmolality and hyponatremia: Secondary | ICD-10-CM | POA: Diagnosis present

## 2021-12-06 DIAGNOSIS — R059 Cough, unspecified: Secondary | ICD-10-CM

## 2021-12-06 DIAGNOSIS — N179 Acute kidney failure, unspecified: Secondary | ICD-10-CM | POA: Diagnosis present

## 2021-12-06 DIAGNOSIS — S2242XD Multiple fractures of ribs, left side, subsequent encounter for fracture with routine healing: Secondary | ICD-10-CM | POA: Diagnosis not present

## 2021-12-06 DIAGNOSIS — J9811 Atelectasis: Secondary | ICD-10-CM | POA: Diagnosis present

## 2021-12-06 DIAGNOSIS — Z20822 Contact with and (suspected) exposure to covid-19: Secondary | ICD-10-CM | POA: Diagnosis present

## 2021-12-06 DIAGNOSIS — N189 Chronic kidney disease, unspecified: Secondary | ICD-10-CM | POA: Diagnosis not present

## 2021-12-06 DIAGNOSIS — Z79899 Other long term (current) drug therapy: Secondary | ICD-10-CM

## 2021-12-06 DIAGNOSIS — N1832 Chronic kidney disease, stage 3b: Secondary | ICD-10-CM

## 2021-12-06 DIAGNOSIS — I4821 Permanent atrial fibrillation: Secondary | ICD-10-CM | POA: Diagnosis present

## 2021-12-06 DIAGNOSIS — R5383 Other fatigue: Secondary | ICD-10-CM

## 2021-12-06 DIAGNOSIS — Z7901 Long term (current) use of anticoagulants: Secondary | ICD-10-CM

## 2021-12-06 DIAGNOSIS — Z9049 Acquired absence of other specified parts of digestive tract: Secondary | ICD-10-CM

## 2021-12-06 DIAGNOSIS — Z8616 Personal history of COVID-19: Secondary | ICD-10-CM

## 2021-12-06 DIAGNOSIS — R627 Adult failure to thrive: Secondary | ICD-10-CM | POA: Diagnosis present

## 2021-12-06 DIAGNOSIS — K219 Gastro-esophageal reflux disease without esophagitis: Secondary | ICD-10-CM | POA: Diagnosis present

## 2021-12-06 DIAGNOSIS — Z853 Personal history of malignant neoplasm of breast: Secondary | ICD-10-CM

## 2021-12-06 DIAGNOSIS — Z9013 Acquired absence of bilateral breasts and nipples: Secondary | ICD-10-CM

## 2021-12-06 DIAGNOSIS — E1140 Type 2 diabetes mellitus with diabetic neuropathy, unspecified: Secondary | ICD-10-CM

## 2021-12-06 DIAGNOSIS — L8990 Pressure ulcer of unspecified site, unspecified stage: Secondary | ICD-10-CM | POA: Insufficient documentation

## 2021-12-06 LAB — CBC WITH DIFFERENTIAL/PLATELET
Abs Immature Granulocytes: 0.04 10*3/uL (ref 0.00–0.07)
Basophils Absolute: 0 10*3/uL (ref 0.0–0.1)
Basophils Relative: 1 %
Eosinophils Absolute: 0.1 10*3/uL (ref 0.0–0.5)
Eosinophils Relative: 2 %
HCT: 37.6 % (ref 36.0–46.0)
Hemoglobin: 12.2 g/dL (ref 12.0–15.0)
Immature Granulocytes: 1 %
Lymphocytes Relative: 13 %
Lymphs Abs: 1 10*3/uL (ref 0.7–4.0)
MCH: 30.4 pg (ref 26.0–34.0)
MCHC: 32.4 g/dL (ref 30.0–36.0)
MCV: 93.8 fL (ref 80.0–100.0)
Monocytes Absolute: 0.5 10*3/uL (ref 0.1–1.0)
Monocytes Relative: 6 %
Neutro Abs: 6.5 10*3/uL (ref 1.7–7.7)
Neutrophils Relative %: 77 %
Platelets: 274 10*3/uL (ref 150–400)
RBC: 4.01 MIL/uL (ref 3.87–5.11)
RDW: 16.5 % — ABNORMAL HIGH (ref 11.5–15.5)
WBC: 8.3 10*3/uL (ref 4.0–10.5)
nRBC: 0 % (ref 0.0–0.2)

## 2021-12-06 LAB — COMPREHENSIVE METABOLIC PANEL
ALT: 22 U/L (ref 0–44)
AST: 25 U/L (ref 15–41)
Albumin: 3.2 g/dL — ABNORMAL LOW (ref 3.5–5.0)
Alkaline Phosphatase: 112 U/L (ref 38–126)
Anion gap: 4 — ABNORMAL LOW (ref 5–15)
BUN: 21 mg/dL (ref 8–23)
CO2: 23 mmol/L (ref 22–32)
Calcium: 10.6 mg/dL — ABNORMAL HIGH (ref 8.9–10.3)
Chloride: 114 mmol/L — ABNORMAL HIGH (ref 98–111)
Creatinine, Ser: 1.06 mg/dL — ABNORMAL HIGH (ref 0.44–1.00)
GFR, Estimated: 49 mL/min — ABNORMAL LOW (ref >60)
Glucose, Bld: 126 mg/dL — ABNORMAL HIGH (ref 70–99)
Potassium: 4.7 mmol/L (ref 3.5–5.1)
Sodium: 141 mmol/L (ref 135–145)
Total Bilirubin: 1 mg/dL (ref 0.3–1.2)
Total Protein: 6.4 g/dL — ABNORMAL LOW (ref 6.5–8.1)

## 2021-12-06 LAB — TROPONIN I (HIGH SENSITIVITY)
Troponin I (High Sensitivity): 34 ng/L — ABNORMAL HIGH (ref <18)
Troponin I (High Sensitivity): 28 ng/L — ABNORMAL HIGH (ref <18)

## 2021-12-06 LAB — MRSA NEXT GEN BY PCR, NASAL: MRSA by PCR Next Gen: NOT DETECTED

## 2021-12-06 LAB — RESP PANEL BY RT-PCR (FLU A&B, COVID) ARPGX2
Influenza A by PCR: NEGATIVE
Influenza B by PCR: NEGATIVE
SARS Coronavirus 2 by RT PCR: NEGATIVE

## 2021-12-06 LAB — POC COVID19 BINAXNOW: SARS Coronavirus 2 Ag: NEGATIVE

## 2021-12-06 LAB — BRAIN NATRIURETIC PEPTIDE: B Natriuretic Peptide: 522.5 pg/mL — ABNORMAL HIGH (ref 0.0–100.0)

## 2021-12-06 MED ORDER — MAGNESIUM HYDROXIDE 400 MG/5ML PO SUSP: 5.0000 mL | Freq: Every day | ORAL | Status: DC | PRN

## 2021-12-06 MED ORDER — GABAPENTIN 100 MG PO CAPS
100.0000 mg | ORAL_CAPSULE | Freq: Every day | ORAL | Status: DC
  Administered 2021-12-06 – 2021-12-09 (×4): 100 mg via ORAL
  Filled 2021-12-06 (×4): qty 1

## 2021-12-06 MED ORDER — POTASSIUM CHLORIDE CRYS ER 20 MEQ PO TBCR
20.0000 meq | EXTENDED_RELEASE_TABLET | Freq: Every day | ORAL | Status: DC
  Administered 2021-12-07 – 2021-12-08 (×2): 20 meq via ORAL
  Filled 2021-12-06 (×3): qty 1

## 2021-12-06 MED ORDER — HYDRALAZINE HCL 20 MG/ML IJ SOLN: 5.0000 mg | INTRAMUSCULAR | Status: DC | PRN

## 2021-12-06 MED ORDER — ACETAMINOPHEN 325 MG PO TABS
650.0000 mg | ORAL_TABLET | ORAL | Status: DC | PRN
  Administered 2021-12-10 – 2021-12-12 (×2): 650 mg via ORAL
  Filled 2021-12-06 (×2): qty 2

## 2021-12-06 MED ORDER — FUROSEMIDE 10 MG/ML IJ SOLN
40.0000 mg | Freq: Once | INTRAMUSCULAR | Status: AC
  Administered 2021-12-06: 40 mg via INTRAVENOUS
  Filled 2021-12-06: qty 4

## 2021-12-06 MED ORDER — GABAPENTIN 300 MG PO CAPS
300.0000 mg | ORAL_CAPSULE | Freq: Three times a day (TID) | ORAL | Status: DC
  Administered 2021-12-06: 300 mg via ORAL
  Filled 2021-12-06: qty 1

## 2021-12-06 MED ORDER — ONDANSETRON HCL 4 MG/2ML IJ SOLN: 4.0000 mg | Freq: Four times a day (QID) | INTRAMUSCULAR | Status: DC | PRN

## 2021-12-06 MED ORDER — SODIUM CHLORIDE 0.9% FLUSH: 3.0000 mL | INTRAVENOUS | Status: DC | PRN

## 2021-12-06 MED ORDER — CARVEDILOL 12.5 MG PO TABS
12.5000 mg | ORAL_TABLET | Freq: Two times a day (BID) | ORAL | Status: DC
  Administered 2021-12-06 – 2021-12-12 (×11): 12.5 mg via ORAL
  Filled 2021-12-06 (×11): qty 1

## 2021-12-06 MED ORDER — SODIUM CHLORIDE 0.9 % IV SOLN: 250.0000 mL | INTRAVENOUS | Status: DC | PRN

## 2021-12-06 MED ORDER — GABAPENTIN 300 MG PO CAPS
300.0000 mg | ORAL_CAPSULE | Freq: Three times a day (TID) | ORAL | Status: DC
  Administered 2021-12-07: 300 mg via ORAL
  Filled 2021-12-06: qty 1

## 2021-12-06 MED ORDER — MIRTAZAPINE 7.5 MG PO TABS
3.7500 mg | ORAL_TABLET | Freq: Every day | ORAL | Status: DC
  Administered 2021-12-06 – 2021-12-11 (×6): 3.75 mg via ORAL
  Filled 2021-12-06 (×7): qty 1

## 2021-12-06 MED ORDER — APIXABAN 5 MG PO TABS
5.0000 mg | ORAL_TABLET | Freq: Two times a day (BID) | ORAL | Status: DC
  Administered 2021-12-06 – 2021-12-08 (×4): 5 mg via ORAL
  Filled 2021-12-06 (×4): qty 1

## 2021-12-06 MED ORDER — IPRATROPIUM-ALBUTEROL 0.5-2.5 (3) MG/3ML IN SOLN
3.0000 mL | Freq: Once | RESPIRATORY_TRACT | Status: AC
  Administered 2021-12-06: 3 mL via RESPIRATORY_TRACT
  Filled 2021-12-06: qty 3

## 2021-12-06 MED ORDER — SODIUM CHLORIDE 0.9% FLUSH
3.0000 mL | Freq: Two times a day (BID) | INTRAVENOUS | Status: DC
  Administered 2021-12-06 – 2021-12-12 (×11): 3 mL via INTRAVENOUS

## 2021-12-06 MED ORDER — FUROSEMIDE 10 MG/ML IJ SOLN
40.0000 mg | Freq: Two times a day (BID) | INTRAMUSCULAR | Status: DC
  Administered 2021-12-06 – 2021-12-07 (×3): 40 mg via INTRAVENOUS
  Filled 2021-12-06 (×3): qty 4

## 2021-12-06 MED ORDER — IOHEXOL 350 MG/ML SOLN
70.0000 mL | Freq: Once | INTRAVENOUS | Status: AC | PRN
  Administered 2021-12-06: 70 mL via INTRAVENOUS

## 2021-12-06 MED ORDER — BISACODYL 5 MG PO TBEC
5.0000 mg | DELAYED_RELEASE_TABLET | Freq: Every day | ORAL | Status: DC
  Administered 2021-12-06 – 2021-12-11 (×6): 5 mg via ORAL
  Filled 2021-12-06 (×6): qty 1

## 2021-12-06 NOTE — Assessment & Plan Note (Addendum)
Calcium 10.6. -Continue to monitor calcium levels with IV diuresis.

## 2021-12-06 NOTE — Assessment & Plan Note (Addendum)
Hypercalcemia/ hyponatremia  Patient underwent diuresis with good toleration, peak Cr up to 1,50 With supportive medical therapy her renal function has improved at her discharge is 1,0 with K at 4,5 and serum bicarbonate at 25. Na 140 Ca is 10.1

## 2021-12-06 NOTE — Assessment & Plan Note (Addendum)
Patient was admitted to the cardiac unit and was placed on aggressive diuresis with furosemide. Negative fluid balance was achieved with improvement of her symptoms.   Further work up with echocardiography showed  EF o f60 to 65% with moderate reduction in RV systolic function. RV systolic pressure 52 mmHg. No significant valvular disease.  Urine output 7,915 ml Systolic blood pressure 041 to 134 mmHg   Patient will continue heart failure management with carvedilol and furosemide. Discontinue midodrine at discharge.

## 2021-12-06 NOTE — Assessment & Plan Note (Addendum)
No ACS, likely from demand in the setting of CHF

## 2021-12-06 NOTE — Progress Notes (Addendum)
Subjective:  Patient ID: Jessica [name], female    DOB: 09/07/1927  Age: 88 y.o. MRN: [id number]  CC:  Chief Complaint  Patient presents with   Cough      HPI  This patient arrives today for the above. She is accompanied by her daughter whom she lives with.   Patient is alert and hard of hearing.  Her daughter tells me that her cognition is at baseline.  She tells me approximately 2 days ago she started noticing that the patient was coughing more frequently.  Patient does have a history of COVID-19 infection approximately 6 months ago.  She was hospitalized at that time and ended up in rehab facility.  She was ultimately discharged on oxygen as needed.  The patient does come in today on her at home oxygen she is wearing 2 L/min.  Prior to today the patient has not needed oxygen for around 2 months.  The daughter tells me she checked her mom's oxygen saturation this morning and it was in the 70s on room air.  The patient has not been complaining of shortness of breath or chest pain however the daughter feels like the patient appears more labored in her breathing.  The daughter also mentions physical therapy comes to the house a few times a week, and the patient has been more weak over the last few days.  The patient has not been around any sick contacts as far as the daughter knows, she did have visitors at her home last week.  She has not been tested for COVID-19 infection in the last few days.  She has a history of heart failure and atrial fibrillation. She has not been taking her lasix due to labile blood pressures. Per her daughter when the patient gets fluid overloaded she will often have leg swelling and the daughter has not noted any swelling over the last 2 days.   Past Medical History:  Diagnosis Date   Breast cancer ([place])    CAD (coronary artery disease)    a. s/p DESx2 to mid and distal RCA in 2010.   Cholelithiasis 09/12/2013   Lap Chole on 09/14/13    Chronic combined  systolic and diastolic CHF (congestive heart failure) ([place])    a. Echo 06/09/15: EF 55-60% -> 10/2017 EF 30-35% (pt and daughter did not wish to proceed with ischemic assessment - conservative rx).   CKD (chronic kidney disease), stage III (HCC)    Diabetes mellitus    NON INSULIN DEPENDENT   DJD (degenerative joint disease)    GERD (gastroesophageal reflux disease)    Hemorrhoids    History of diabetic neuropathy    Hypercholesterolemia    Hypertension    Hypertensive heart disease    Permanent atrial fibrillation ([place])    a. discovered on PPM interrogation 12/15- > eventually progressed to 100% atrial fib burden/permanent atrial fib.   SSS (sick sinus syndrome) (HCC)    a. s/p Medtronic PPM by JA for SSS and syncope 9/11.      Family History  Problem Relation Age of Onset   Cancer Mother    Cancer Father     Social History   Social History Narrative   Not on file   Social History   Tobacco Use   Smoking status: Never   Smokeless tobacco: Never  Substance Use Topics   Alcohol use: No    Alcohol/week: 0.0 standard drinks     Current Meds  Medication Sig  acetaminophen (TYLENOL) 325 MG tablet Take 650 mg by mouth every 6 (six) hours as needed for fever or mild pain.   apixaban (ELIQUIS) 5 MG TABS tablet Take 1 tablet (5 mg total) by mouth 2 (two) times daily.   b complex vitamins tablet Take 1 tablet by mouth daily.   bisacodyl (DULCOLAX) 10 MG suppository Place 10 mg rectally as needed for moderate constipation.   Bisacodyl (LAXATIVE PO) Take by mouth as needed.   carvedilol (COREG) 25 MG tablet Take 1 tablet (25 mg total) by mouth 2 (two) times daily. (Patient taking differently: Take 25 mg by mouth 2 (two) times daily. Patient taking 12.5 two times daily)   docusate sodium (COLACE) 100 MG capsule Take 100 mg by mouth at bedtime.   fish oil-omega-3 fatty acids 1000 MG capsule Take 1 g by mouth every morning.   furosemide (LASIX) 20 MG tablet Take 1 tablet (20 mg  total) by mouth daily. TAKE 2 TABLETS BY MOUTH IN THE MORNING, AND  1 TABLET IN THE AFTERNOON   gabapentin (NEURONTIN) 100 MG capsule Take 100 mg by mouth at bedtime.   gabapentin (NEURONTIN) 300 MG capsule TAKE 1 CAPSULE BY MOUTH THREE TIMES DAILY   Magnesium Hydroxide (MILK OF MAGNESIA PO) Take 30 mLs by mouth as needed.   mirtazapine (REMERON) 7.5 MG tablet Take 1 tablet (7.5 mg total) by mouth at bedtime. (Patient taking differently: Take 7.5 mg by mouth at bedtime. Patient report taking half)   nitroGLYCERIN (NITROSTAT) 0.4 MG SL tablet Place 1 tablet (0.4 mg total) under the tongue every 5 (five) minutes as needed for chest pain. x3 doses as needed for chest pain   potassium chloride SA (KLOR-CON) 20 MEQ tablet TAKE 1  BY MOUTH ONCE DAILY   Sodium Phosphates (RA SALINE ENEMA RE) Place 1 Dose rectally as needed.    ROS:  Review of Systems  Constitutional:  Positive for malaise/fatigue. Negative for fever.  Respiratory:  Positive for cough and shortness of breath (daughter has seen labored breathing).   Cardiovascular:  Negative for chest pain.  Neurological:  Positive for weakness.    Objective:   Today's Vitals: BP 135/78 (BP Location: Left Arm, Patient Position: Sitting, Cuff Size: Normal)    Pulse (!) 110    Temp 98 F (36.7 C) (Oral)    LMP  (LMP Unknown)    SpO2 96%  Vitals with BMI 12/06/2021 12/06/2021 11/22/2021  Height - - 5' 4"   Weight - - 146 lbs 3 oz  BMI - - 65.78  Systolic - 469 629  Diastolic - 78 64  Pulse 528 44 72     Physical Exam Vitals reviewed.  Constitutional:      Appearance: Normal appearance.     Comments: Patient slumped over in her wheelchair  HENT:     Head: Normocephalic and atraumatic.  Neck:     Vascular: No carotid bruit.  Cardiovascular:     Rate and Rhythm: Normal rate. Rhythm irregularly irregular.     Pulses: Normal pulses.     Heart sounds: Normal heart sounds.  Pulmonary:     Effort: Pulmonary effort is normal.     Breath sounds:  Examination of the left-lower field reveals rales. Rales present.  Musculoskeletal:     Right lower leg: No edema.     Left lower leg: No edema.  Skin:    General: Skin is warm and dry.  Neurological:     General: No focal deficit present.  Mental Status: She is oriented to person, place, and time. She is lethargic.  Psychiatric:        Mood and Affect: Mood normal.        Behavior: Behavior normal. Behavior is cooperative.        Judgment: Judgment normal.      POC Covid 19 Test: negative POC U/A: ordered, but patient unable to urinate for test today  Assessment and Plan   1. Hypoxia   2. Fatigue, unspecified type      Plan: 1.,  2.  I think most likely etiology is either acute fluid overload related to her heart failure or pneumonia.  She is quite weak and tired today in the office.  Patient's heart rate is elevated in the low 100s, she is hypoxic on room air, she does appear a bit sleepy/lethargic however is responding to verbal stimuli. I did consult with Dr. Quay Burow regarding the situation and based on the patient's age and high risk for morbidity and mortality we have recommended patient be transferred to hospital via EMS.  While patient is acutely hypoxic on room air she is saturating in the mid 90s with 2 LPM of oxygen via her own portable oxygen concentrator, however as stated above she does appear to be very weak and the daughter mentions it is quite difficult to help transfer the patient from wheelchair to car.  Daughter is aware and agreeable.   Tests ordered Orders Placed This Encounter  Procedures   DG Chest 2 View   CBC with Differential/Platelet   Comp Met (CMET)   Urinalysis with Culture, if indicated   POC COVID-19   POCT Urinalysis Dipstick      No orders of the defined types were placed in this encounter.   Patient to follow-up as scheduled, or sooner as needed.  Ailene Ards, NP

## 2021-12-06 NOTE — ED Triage Notes (Signed)
From PCP with c/o cough and SOB x 1 week, rhonchi, covid -, covid + in August.

## 2021-12-06 NOTE — Assessment & Plan Note (Addendum)
On admission hypertensive urgency now has resolved On carvedilol for blood pressure control.

## 2021-12-06 NOTE — H&P (Addendum)
History and Physical    Patient: Jessica [name] ZJI:[id number] DOB: 1927-08-17 DOA: 12/06/2021 DOS: the patient was seen and examined on 12/06/2021 PCP: Binnie Rail, MD   Patient coming from: PCP  Chief Complaint:  Chief Complaint  Patient presents with   Shortness of Breath    HPI: Jessica [name] is a 88 y.o. 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 fib on Eliquis, SSS s/p PPM, CKD stage IIIb, type 2 diabetes mellitus recent diet controlled, history of breast cancer, GERD, essential hypertension, hyperlipidemia who presents with complaints of shortness of breath over the last week.  History is obtained from the patient's daughter over the phone.  Ever since the patient was hospitalized with COVID-19 in 06/2021 she had been progressively declining.  She had been sent to [facility] rehab after her hospitalization and stayed there until November.  After getting out her daughter notes that she and significant pressure sores on her buttocks.  November she also had a fall where she was found to have left-sided rib fracture.  Daughter notes that her memory has been progressively getting worse over the last several months.  She will intermittently be there and at times he is not there at all.  She had been seen by her PCP on 1/20, where furosemide and lisinopril were discontinue due to blood pressures being low at that time 100/52.  Daughter reports that the patient's blood pressures intermittently high and intermittently low throughout the day.  Patient has not had any significant lower extremity swelling to her knowledge.  She has been coughing, but the cough had become more deep like it was down in her chest over the last 2 days.  Taking the patient to her PCP office this morning, but was advised to bring her hospital for further evaluation.  Her daughter is aware that the patient is declining and appears near the  end of her life possibly.  She is trying to prepare herself,  and would like to change her CODE STATUS to DNR.  On admission into the emergency department patient was noted to be afebrile, pulse in the 110's, blood pressures elevated up to 191/114, and O2 saturations as low as 70% on room air currently maintained on 2 L of nasal cannula oxygen.  Labs significant for BNP 522.5 and high-sensitivity troponin 34->28.   Review of Systems: unable to review all systems due to the inability of the patient to answer questions.   Past Medical History:  Diagnosis Date   Breast cancer ([place])    CAD (coronary artery disease)    a. s/p DESx2 to mid and distal RCA in 2010.   Cholelithiasis 09/12/2013   Lap Chole on 09/14/13    Chronic combined systolic and diastolic CHF (congestive heart failure) ([place])    a. Echo 06/09/15: EF 55-60% -> 10/2017 EF 30-35% (pt and daughter did not wish to proceed with ischemic assessment - conservative rx).   CKD (chronic kidney disease), stage III (HCC)    Diabetes mellitus    NON INSULIN DEPENDENT   DJD (degenerative joint disease)    GERD (gastroesophageal reflux disease)    Hemorrhoids    History of diabetic neuropathy    Hypercholesterolemia    Hypertension    Hypertensive heart disease    Permanent atrial fibrillation ([place])    a. discovered on PPM interrogation 12/15- > eventually progressed to 100% atrial fib burden/permanent atrial fib.   SSS (  sick sinus syndrome) ([place])    a. s/p [employer] PPM by JA for SSS and syncope 9/11.   Past Surgical History:  Procedure Laterality Date   CARDIAC CATHETERIZATION  07/05/2009   EF 55-60%   CARDIOVERSION N/A 06/11/2015   Procedure: CARDIOVERSION;  Surgeon: [name], MD;  Location: [place];  Service: Cardiovascular;  Laterality: N/A;   CHOLECYSTECTOMY N/A 09/14/2013   Procedure: LAPAROSCOPIC CHOLECYSTECTOMY WITH INTRAOPERATIVE CHOLANGIOGRAM;  Surgeon: [name], MD;  Location: [place];  Service: General;   Laterality: N/A;   COLONOSCOPY     ERCP N/A 09/13/2013   Procedure: ENDOSCOPIC RETROGRADE CHOLANGIOPANCREATOGRAPHY (ERCP);  Surgeon: [name], MD;  Location: [facility] ENDOSCOPY;  Service: Endoscopy;  Laterality: N/A;   INSERT / REPLACE / REMOVE PACEMAKER  9/11   SSS and syncope, implanted by JA (MDT)   MASTECTOMY  1992   BILATERAL WITH RECONSTRUCTION   Social History:  reports that she has never smoked. She has never used smokeless tobacco. She reports that she does not drink alcohol and does not use drugs.  Allergies  Allergen Reactions   Ciprofloxacin Other (See Comments)    Cipro makes patient extremely mean   Lyrica [Pregabalin] Swelling    Caused weight gain   Amlodipine Swelling   Sulfonamide Derivatives Rash    Family History  Problem Relation Age of Onset   Cancer Mother    Cancer Father     Prior to Admission medications   Medication Sig Start Date End Date Taking? Authorizing Provider  acetaminophen (TYLENOL) 325 MG tablet Take 650 mg by mouth every 6 (six) hours as needed for fever or mild pain.    [provider]  apixaban (ELIQUIS) 5 MG TABS tablet Take 1 tablet (5 mg total) by mouth 2 (two) times daily. 07/05/21   Medina-Vargas, Monina C, NP  b complex vitamins tablet Take 1 tablet by mouth daily.    [provider]  bisacodyl (DULCOLAX) 10 MG suppository Place 10 mg rectally as needed for moderate constipation.    [provider]  Bisacodyl (LAXATIVE PO) Take by mouth as needed.    [provider]  carvedilol (COREG) 25 MG tablet Take 1 tablet (25 mg total) by mouth 2 (two) times daily. Patient taking differently: Take 25 mg by mouth 2 (two) times daily. Patient taking 12.5 two times daily 07/05/21   Medina-Vargas, Monina C, NP  docusate sodium (COLACE) 100 MG capsule Take 100 mg by mouth at bedtime.    [provider]  fish oil-omega-3 fatty acids 1000 MG capsule Take 1 g by mouth every morning.    [provider]   furosemide (LASIX) 20 MG tablet Take 1 tablet (20 mg total) by mouth daily. TAKE 2 TABLETS BY MOUTH IN THE MORNING, AND  1 TABLET IN THE AFTERNOON 07/05/21   Medina-Vargas, Monina C, NP  gabapentin (NEURONTIN) 100 MG capsule Take 100 mg by mouth at bedtime.    [provider]  gabapentin (NEURONTIN) 300 MG capsule TAKE 1 CAPSULE BY MOUTH THREE TIMES DAILY 09/17/21   Binnie Rail, MD  Magnesium Hydroxide (MILK OF MAGNESIA PO) Take 30 mLs by mouth as needed.    [provider]  mirtazapine (REMERON) 7.5 MG tablet Take 1 tablet (7.5 mg total) by mouth at bedtime. Patient taking differently: Take 7.5 mg by mouth at bedtime. Patient report taking half 11/28/21   Binnie Rail, MD  nitroGLYCERIN (NITROSTAT) 0.4 MG SL tablet Place 1 tablet (0.4 mg  total) under the tongue every 5 (five) minutes as needed for chest pain. x3 doses as needed for chest pain 07/05/21   Medina-Vargas, Monina C, NP  potassium chloride SA (KLOR-CON) 20 MEQ tablet TAKE 1  BY MOUTH ONCE DAILY 09/24/21   Binnie Rail, MD  Sodium Phosphates (RA SALINE ENEMA RE) Place 1 Dose rectally as needed.    [provider]    Physical Exam: Vitals:   12/06/21 1052 12/06/21 1057 12/06/21 1100 12/06/21 1145  BP:  (!) 169/89 (!) 159/95 (!) 156/132  Pulse:  (!) 101 (!) 119 (!) 106  Resp:  (!) 26 (!) 23 19  Temp:  98.7 F (37.1 C)    TempSrc:  Axillary    SpO2: 100% 91% 96% 95%   Exam  Constitutional: Elderly female who appears to be chronically ill, but attempts to answer Eyes: Patient will not open left eye on her own, but is able to open the right eye. ENMT: Mucous membranes are moist.  Neck: normal, supple, no masses.  JVD present Respiratory: Decreased aeration with no significant wheezes or rhonchi appreciated Cardiovascular: Irregular irregular with out significant lower extremity swelling at this time. Abdomen: Protuberant abdomen.  No tenderness to palpation.  Bowel sounds present. Neurologic: CN  2-12 grossly intact.  Appears able to move all extremities Psychiatric: Alert and oriented x1   Data Reviewed:  Chest x-ray noted small pleural effusion with mild left basilar opacities.  CTA of the chest noted no pulmonary embolus, thickening of the walls of the bronchi the bilateral lower lobes concerning for acute or chronic bronchitis, small bilateral pleural effusions, displaced fractures of the left lateral sixth through 10th rib subacute to chronic cardiomegaly with three-vessel coronary artery calcifications  Assessment and Plan: Acute respiratory failure with hypoxia secondary to heart failure with reduced EF- (present on admission) Patient presents with progressively worsening shortness of breath over the last several weeks.  Initial O2 saturations noted to be as low as 70% on room air with improvement on 2 L nasal cannula oxygen.  She had been recently taken off of Lasix on 1/20 by PCP due to low blood pressure which may have likely provoked symptoms..  Last available echocardiogram noted EF of 30- 35% in 2018.  BNP was elevated at 522.5. -Admit to telemetry bed -Continuous pulse oximetry with nasal cannula oxygen maintain O2 saturation greater than 92% -Strict I&Os and daily weight -Lasix 40 mg IV twice daily.  Reassess diuresis in a.m. -Consider need to add back low-dose lisinopril -Patient is followed by cardiology Dr. [name] and [name] in the outpatient setting.  Formally consult cardiology if needed in a.m.  Hypertensive urgency- (present on admission) On admission blood pressures elevated at 191/114.  Home blood pressure regimen includes Coreg 12.5 mg twice daily. -Continue home blood pressure regimen -Hydralazine 5 mg IV prn sBP >180  Type 2 diabetes mellitus with diabetic neuropathy, unspecified ([place])- (present on admission) Diet controlled.  Last hemoglobin A1c was 6.7 in 11/2021. -Continue heart healthy and carb modified diet  Memory loss- (present on  admission) Patient's daughter notes that she has had progressive decline in her memory and cognitive abilities the last several months since having COVID.    Hypercalcemia- (present on admission) Calcium 10.6. -Continue to monitor calcium levels with IV diuresis.  Rib fractures- (present on admission) As noted on CT angiogram of the chest.  Suspect this is secondary to previous fall in 09/2021.  Elevated troponin- (present on admission) 34->28.  Suspect secondary to  demand in setting of CHF exacerbation. -Continue to monitor  Atrial fibrillation ([place])- (present on admission) Continue Coreg and apixaban  CKD (chronic kidney disease) stage 3, GFR 30-59 ml/min (HCC)- (present on admission) Creatinine stable at 1.06. -Continue to monitor kidney function daily with diuresis  SSS (sick sinus syndrome) ([place])- (present on admission) S/p PM     Advance Care Planning:   Code Status: DNR   Consults: None  Family Communication: Discussed patient's care with daughter over the phone  Severity of Illness: The appropriate patient status for this patient is INPATIENT. Inpatient status is judged to be reasonable and necessary in order to provide the required intensity of service to ensure the patient's safety. The patient's presenting symptoms, physical exam findings, and initial radiographic and laboratory data in the context of their chronic comorbidities is felt to place them at high risk for further clinical deterioration. Furthermore, it is not anticipated that the patient will be medically stable for discharge from the hospital within 2 midnights of admission.   * I certify that at the point of admission it is my clinical judgment that the patient will require inpatient hospital care spanning beyond 2 midnights from the point of admission due to high intensity of service, high risk for further deterioration and high frequency of surveillance required.*  Author: Norval Morton,  MD 12/06/2021 2:14 PM  For on call review [email or web address].

## 2021-12-06 NOTE — ED Notes (Signed)
Patient transported to CT 

## 2021-12-06 NOTE — Assessment & Plan Note (Addendum)
Patient has pace maker in place.

## 2021-12-06 NOTE — ED Provider Notes (Signed)
[facility] EMERGENCY DEPARTMENT Provider Note  CSN: [id number] Arrival date & time: 12/06/21 1049  Chief Complaint(s) Shortness of Breath  HPI Jessica [name] is a 88 y.o. female with PMH CHF, CKD 3, CAD status post MI and DES x2, permanent A. fib on Eliquis who presents emergency department for evaluation of hypoxia and shortness of breath.  Patient presents from her PCPs office due to concern for possible CHF exacerbation and new oxygen requirement.  Patient does have a prescription for home O2 and this was prescribed during a previous COVID diagnosis but she has not required it until last week where her daughter has noticed a progressive decline in the patient's ability to ambulate.  Daughter states that patient has been coughing more and was recently taken off of her diuretic medication.  Denies chest pain, abdominal pain, nausea, vomiting, fever or other systemic symptoms.  Patient was found to be 70% on room air and is maintaining oxygen saturations on 2 L.   Shortness of Breath Associated symptoms: cough    Past Medical History Past Medical History:  Diagnosis Date   Breast cancer ([place])    CAD (coronary artery disease)    a. s/p DESx2 to mid and distal RCA in 2010.   Cholelithiasis 09/12/2013   Lap Chole on 09/14/13    Chronic combined systolic and diastolic CHF (congestive heart failure) ([place])    a. Echo 06/09/15: EF 55-60% -> 10/2017 EF 30-35% (pt and daughter did not wish to proceed with ischemic assessment - conservative rx).   CKD (chronic kidney disease), stage III (HCC)    Diabetes mellitus    NON INSULIN DEPENDENT   DJD (degenerative joint disease)    GERD (gastroesophageal reflux disease)    Hemorrhoids    History of diabetic neuropathy    Hypercholesterolemia    Hypertension    Hypertensive heart disease    Permanent atrial fibrillation ([place])    a. discovered on PPM interrogation 12/15- > eventually progressed to 100% atrial fib burden/permanent  atrial fib.   SSS (sick sinus syndrome) (HCC)    a. s/p Medtronic PPM by JA for SSS and syncope 9/11.   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) 07/09/2021   Bed sore 07/09/2021   Acute hypoxemic respiratory failure due to COVID-19 [facility]) 06/10/2021   Acute respiratory failure with hypoxia (HCC) 06/10/2021   Abdominal bloating 03/09/2020   Chronic constipation 03/09/2020   H/O bilateral mastectomy 10/13/2018   HX: breast cancer 10/13/2018   Cardiac pacemaker in situ    Chronic combined systolic and diastolic CHF (congestive heart failure) ([place]) 10/12/2017   UTI (urinary tract infection) 03/28/2017   Bilateral leg edema 04/29/2016   CKD (chronic kidney disease) stage 3, GFR 30-59 ml/min (HCC) 06/10/2015   CAD (coronary artery disease) 06/09/2015   Diabetic neuropathy ([place]) 07/06/2013   SSS (sick sinus syndrome) ([place]) 02/10/2013   Hypertension 07/30/2012   Pacemaker-Medtronic 07/19/2012   Benign hypertensive heart disease with heart failure ([place]) 01/28/2012   Type 2 diabetes mellitus with diabetic neuropathy, unspecified ([place]) 10/22/2010   HYPERCHOLESTEROLEMIA 10/22/2010   DEGENERATIVE JOINT DISEASE 10/22/2010   Home Medication(s) Prior to Admission medications   Medication Sig Start Date End Date Taking? Authorizing Provider  acetaminophen (TYLENOL) 325 MG tablet Take 650 mg by mouth every 6 (  six) hours as needed for fever or mild pain.    [provider]  apixaban (ELIQUIS) 5 MG TABS tablet Take 1 tablet (5 mg total) by mouth 2 (two) times daily. 07/05/21   Medina-Vargas, Monina C, NP  b complex vitamins tablet Take 1 tablet by mouth daily.    [provider]  bisacodyl (DULCOLAX) 10 MG suppository Place 10 mg rectally as needed for moderate constipation.    [provider]   Bisacodyl (LAXATIVE PO) Take by mouth as needed.    [provider]  carvedilol (COREG) 25 MG tablet Take 1 tablet (25 mg total) by mouth 2 (two) times daily. Patient taking differently: Take 25 mg by mouth 2 (two) times daily. Patient taking 12.5 two times daily 07/05/21   Medina-Vargas, Monina C, NP  docusate sodium (COLACE) 100 MG capsule Take 100 mg by mouth at bedtime.    [provider]  fish oil-omega-3 fatty acids 1000 MG capsule Take 1 g by mouth every morning.    [provider]  furosemide (LASIX) 20 MG tablet Take 1 tablet (20 mg total) by mouth daily. TAKE 2 TABLETS BY MOUTH IN THE MORNING, AND  1 TABLET IN THE AFTERNOON 07/05/21   Medina-Vargas, Monina C, NP  gabapentin (NEURONTIN) 100 MG capsule Take 100 mg by mouth at bedtime.    [provider]  gabapentin (NEURONTIN) 300 MG capsule TAKE 1 CAPSULE BY MOUTH THREE TIMES DAILY 09/17/21   Binnie Rail, MD  Magnesium Hydroxide (MILK OF MAGNESIA PO) Take 30 mLs by mouth as needed.    [provider]  mirtazapine (REMERON) 7.5 MG tablet Take 1 tablet (7.5 mg total) by mouth at bedtime. Patient taking differently: Take 7.5 mg by mouth at bedtime. Patient report taking half 11/28/21   Binnie Rail, MD  nitroGLYCERIN (NITROSTAT) 0.4 MG SL tablet Place 1 tablet (0.4 mg total) under the tongue every 5 (five) minutes as needed for chest pain. x3 doses as needed for chest pain 07/05/21   Medina-Vargas, Monina C, NP  potassium chloride SA (KLOR-CON) 20 MEQ tablet TAKE 1  BY MOUTH ONCE DAILY 09/24/21   Binnie Rail, MD  Sodium Phosphates (RA SALINE ENEMA RE) Place 1 Dose rectally as needed.    [provider]                                                                                                                                    Past Surgical History Past Surgical History:  Procedure Laterality Date   CARDIAC CATHETERIZATION  07/05/2009   EF 55-60%   CARDIOVERSION N/A 06/11/2015    Procedure: CARDIOVERSION;  Surgeon: [name], MD;  Location: [facility];  Service: Cardiovascular;  Laterality: N/A;   CHOLECYSTECTOMY N/A 09/14/2013   Procedure: LAPAROSCOPIC CHOLECYSTECTOMY WITH INTRAOPERATIVE CHOLANGIOGRAM;  Surgeon: [name], MD;  Location: [facility];  Service: General;  Laterality: N/A;  COLONOSCOPY     ERCP N/A 09/13/2013   Procedure: ENDOSCOPIC RETROGRADE CHOLANGIOPANCREATOGRAPHY (ERCP);  Surgeon: [name], MD;  Location: [facility] ENDOSCOPY;  Service: Endoscopy;  Laterality: N/A;   INSERT / REPLACE / REMOVE PACEMAKER  9/11   SSS and syncope, implanted by JA (MDT)   MASTECTOMY  1992   BILATERAL WITH RECONSTRUCTION   Family History Family History  Problem Relation Age of Onset   Cancer Mother    Cancer Father     Social History Social History   Tobacco Use   Smoking status: Never   Smokeless tobacco: Never  Vaping Use   Vaping Use: Never used  Substance Use Topics   Alcohol use: No    Alcohol/week: 0.0 standard drinks   Drug use: No   Allergies Ciprofloxacin, Lyrica [pregabalin], Amlodipine, and Sulfonamide derivatives  Review of Systems Review of Systems  Respiratory:  Positive for cough and shortness of breath.    Physical Exam Vital Signs  I have reviewed the triage vital signs BP (!) 156/132    Pulse (!) 106    Temp 98.7 F (37.1 C) (Axillary)    Resp 19    LMP  (LMP Unknown)    SpO2 95%   Physical Exam Vitals and nursing note reviewed.  Constitutional:      General: She is not in acute distress.    Appearance: She is well-developed.  HENT:     Head: Normocephalic and atraumatic.  Eyes:     Conjunctiva/sclera: Conjunctivae normal.  Cardiovascular:     Rate and Rhythm: Normal rate and regular rhythm.     Heart sounds: No murmur heard. Pulmonary:     Effort: Pulmonary effort is normal. No respiratory distress.     Breath sounds: Wheezing present.  Abdominal:     Palpations: Abdomen is soft.     Tenderness: There is  no abdominal tenderness.  Musculoskeletal:        General: No swelling.     Cervical back: Neck supple.  Skin:    General: Skin is warm and dry.     Capillary Refill: Capillary refill takes less than 2 seconds.  Neurological:     Mental Status: She is alert.  Psychiatric:        Mood and Affect: Mood normal.    ED Results and Treatments Labs (all labs ordered are listed, but only abnormal results are displayed) Labs Reviewed  CBC WITH DIFFERENTIAL/PLATELET - Abnormal; Notable for the following components:      Result Value   RDW 16.5 (*)    All other components within normal limits  RESP PANEL BY RT-PCR (FLU A&B, COVID) ARPGX2  COMPREHENSIVE METABOLIC PANEL  BRAIN NATRIURETIC PEPTIDE  TROPONIN I (HIGH SENSITIVITY)                                                                                                                          Radiology No results found.  Pertinent labs & imaging results that  were available during my care of the patient were reviewed by me and considered in my medical decision making (see MDM for details).  Medications Ordered in ED Medications - No data to display                                                                                                                                   Procedures Procedures  (including critical care time)  Medical Decision Making / ED Course   This patient presents to the ED for concern of shortness of breath and hypoxia, this involves an extensive number of treatment options, and is a complaint that carries with it a high risk of complications and morbidity.  The differential diagnosis includes CHF exacerbation, pneumonia, PE, ACS  MDM: Patient seen emergency department for evaluation of shortness of breath.  Physical exam with mild expiratory wheezing in the left upper lobe.  Laboratory evaluation largely unremarkable, BNP elevated to 522.5, initial troponin 34, delta Trope 28.  ECG with atrial  fibrillation.  COVID and flu negative.  Chest x-ray with left pleural effusion and possible pneumonia.  CT PE with no pneumonia but bilateral small pleural effusions and atelectasis, as well as displaced left lateral rib fractures which were already previously known.  Single DuoNeb given and on reevaluation patient's wheezing resolved.  We attempted a trial of taking the patient off of her oxygen, but unfortunately she dropped her O2 saturations to 85% with tachypnea in the upper 30s.  Lasix given.  Patient will require admission for new oxygen requirement in setting of interstitial lung disease.  Patient admitted.   Additional history obtained: -Additional history obtained from daughter -External records from outside source obtained and reviewed including: Chart review including previous notes, labs, imaging, consultation notes   Lab Tests: -I ordered, reviewed, and interpreted labs.   The pertinent results include:   Labs Reviewed  CBC WITH DIFFERENTIAL/PLATELET - Abnormal; Notable for the following components:      Result Value   RDW 16.5 (*)    All other components within normal limits  RESP PANEL BY RT-PCR (FLU A&B, COVID) ARPGX2  COMPREHENSIVE METABOLIC PANEL  BRAIN NATRIURETIC PEPTIDE  TROPONIN I (HIGH SENSITIVITY)      EKG   EKG Interpretation  Date/Time:  Friday December 06 2021 11:19:25 EST Ventricular Rate:  108 PR Interval:    QRS Duration: 95 QT Interval:  311 QTC Calculation: 417 R Axis:   78 Text Interpretation: Atrial fibrillation Confirmed by Logun Colavito ([id number]) on 12/06/2021 12:27:47 PM         Imaging Studies ordered: I ordered imaging studies including chest x-ray, CT PE I independently visualized and interpreted imaging. I agree with the radiologist interpretation   Medicines ordered and prescription drug management: No orders of the defined types were placed in this encounter.   -I have reviewed the patients home medicines and have made  adjustments as needed  Critical interventions  Oxygen delivery    Cardiac Monitoring: The patient was maintained on a cardiac monitor.  I personally viewed and interpreted the cardiac monitored which showed an underlying rhythm of: A. fib  Social Determinants of Health:  Factors impacting patients care include: none   Reevaluation: After the interventions noted above, I reevaluated the patient and found that they have :stayed the same  Co morbidities that complicate the patient evaluation  Past Medical History:  Diagnosis Date   Breast cancer ([place])    CAD (coronary artery disease)    a. s/p DESx2 to mid and distal RCA in 2010.   Cholelithiasis 09/12/2013   Lap Chole on 09/14/13    Chronic combined systolic and diastolic CHF (congestive heart failure) ([place])    a. Echo 06/09/15: EF 55-60% -> 10/2017 EF 30-35% (pt and daughter did not wish to proceed with ischemic assessment - conservative rx).   CKD (chronic kidney disease), stage III (HCC)    Diabetes mellitus    NON INSULIN DEPENDENT   DJD (degenerative joint disease)    GERD (gastroesophageal reflux disease)    Hemorrhoids    History of diabetic neuropathy    Hypercholesterolemia    Hypertension    Hypertensive heart disease    Permanent atrial fibrillation ([place])    a. discovered on PPM interrogation 12/15- > eventually progressed to 100% atrial fib burden/permanent atrial fib.   SSS (sick sinus syndrome) (HCC)    a. s/p Medtronic PPM by JA for SSS and syncope 9/11.      Dispostion: I considered admission for this patient, and due to new oxygen requirement, patient will be admitted.     Final Clinical Impression(s) / ED Diagnoses Final diagnoses:  None     @PCDICTATION @    Makylie Rivere, Debe Coder, MD 12/06/21 1500

## 2021-12-06 NOTE — Assessment & Plan Note (Addendum)
Patient with progressive decline in her memory and cognitive abilities

## 2021-12-06 NOTE — Assessment & Plan Note (Addendum)
Her glucose remained well controlled during her hospitalization.,

## 2021-12-06 NOTE — TOC Progression Note (Signed)
Transition of Care [facility] - Progression Note    Patient Details  Name: Jessica [name] MRN: [id number] Date of Birth: 1927/01/14  Transition of [facility]) CM/SW Contact  Zenon Mayo, RN Phone Number: 12/06/2021, 4:48 PM  Clinical Narrative:     Transition of Care [facility]) Screening Note   Patient Details  Name: Jessica [name] Date of Birth: July 21, 1927   Transition of Care [facility]) CM/SW Contact:    Zenon Mayo, RN Phone Number: 12/06/2021, 4:48 PM    Transition of Care Department [facility]) has reviewed patient and no TOC needs have been identified at this time. We will continue to monitor patient advancement through interdisciplinary progression rounds. If new patient transition needs arise, please place a TOC consult.          Expected Discharge Plan and Services                                                 Social Determinants of Health (SDOH) Interventions    Readmission Risk Interventions No flowsheet data found.

## 2021-12-06 NOTE — Assessment & Plan Note (Addendum)
As noted on CT angiogram of the chest.  Suspect this is secondary to previous fall in 09/2021. Patient will be discharged home with hospice services.

## 2021-12-06 NOTE — Assessment & Plan Note (Addendum)
Rate control with carvedilol and anticoagulation with apixaban

## 2021-12-07 DIAGNOSIS — L8990 Pressure ulcer of unspecified site, unspecified stage: Secondary | ICD-10-CM | POA: Insufficient documentation

## 2021-12-07 LAB — BASIC METABOLIC PANEL
Anion gap: 10 (ref 5–15)
BUN: 21 mg/dL (ref 8–23)
CO2: 22 mmol/L (ref 22–32)
Calcium: 10.3 mg/dL (ref 8.9–10.3)
Chloride: 107 mmol/L (ref 98–111)
Creatinine, Ser: 1.11 mg/dL — ABNORMAL HIGH (ref 0.44–1.00)
GFR, Estimated: 47 mL/min — ABNORMAL LOW (ref >60)
Glucose, Bld: 143 mg/dL — ABNORMAL HIGH (ref 70–99)
Potassium: 4.2 mmol/L (ref 3.5–5.1)
Sodium: 139 mmol/L (ref 135–145)

## 2021-12-07 MED ORDER — GABAPENTIN 300 MG PO CAPS
300.0000 mg | ORAL_CAPSULE | Freq: Two times a day (BID) | ORAL | Status: DC
  Administered 2021-12-07 – 2021-12-10 (×5): 300 mg via ORAL
  Filled 2021-12-07 (×5): qty 1

## 2021-12-07 MED ORDER — MIDODRINE HCL 5 MG PO TABS
5.0000 mg | ORAL_TABLET | Freq: Two times a day (BID) | ORAL | Status: DC
  Administered 2021-12-07 – 2021-12-10 (×6): 5 mg via ORAL
  Filled 2021-12-07 (×7): qty 1

## 2021-12-07 NOTE — Plan of Care (Signed)
°  Problem: Clinical Measurements: Goal: Diagnostic test results will improve Outcome: Progressing Goal: Respiratory complications will improve Outcome: Progressing   Problem: Elimination: Goal: Will not experience complications related to urinary retention Outcome: Progressing   Problem: Cardiac: Goal: Ability to achieve and maintain adequate cardiopulmonary perfusion will improve Outcome: Progressing

## 2021-12-07 NOTE — Progress Notes (Signed)
Initial Nutrition Assessment  DOCUMENTATION CODES:   Not applicable  INTERVENTION:  -recommend consult to diabetes coordinator -Ensure Enlive po BID, each supplement provides 350 kcal and 20 grams of protein. -MVI with minerals daily  NUTRITION DIAGNOSIS:   Inadequate oral intake related to lethargy/confusion as evidenced by per patient/family report, meal completion < 50%  GOAL:   Patient will meet greater than or equal to 90% of their needs  MONITOR:   PO intake, Supplement acceptance, Diet advancement, Labs, Weight trends, I & O's  REASON FOR ASSESSMENT:   Malnutrition Screening Tool    ASSESSMENT:   Pt with PMH significant for CAD s/p PCI, CHF, Afib, SSS s/p PPM, CKD stg 3b, type 2 DM, h/o breast Ca, GERD, HTN, HLD admitted with acute respiratory failure with hypoxia 2/2 CHF  RD working remotely and unable to reach pt via phone. Per MD, pt is a poor historian and pt's daughter reported pt has been declining in her memory and cognitive abilities over the last several months since having COVID (August).   PO Intake: 25% x 2 recorded meals    UOP: 1970ml x24 hours I/O: -1853ml since admit  Medications: dulcolax, lasix, remeron, klor-con Labs: Recent Labs  Lab 12/06/21 1110 12/07/21 0339  NA 141 139  K 4.7 4.2  CL 114* 107  CO2 23 22  BUN 21 21  CREATININE 1.06* 1.11*  CALCIUM 10.6* 10.3  GLUCOSE 126* 143*  CBGs: [phone]    NUTRITION - FOCUSED PHYSICAL EXAM: Unable to complete at this time. Will attempt at follow-up.   Diet Order:   Diet Order             Diet heart healthy/carb modified Room service appropriate? Yes with Assist; Fluid consistency: Thin  Diet effective now                   EDUCATION NEEDS:   Not appropriate for education at this time  Skin:  Skin Assessment: Skin Integrity Issues: Skin Integrity Issues:: Stage I Stage I: sacrum  Last BM:  2/3  Height:   Ht Readings from Last 1 Encounters:  12/06/21 5\' 4"   (1.626 m)    Weight:   Wt Readings from Last 1 Encounters:  12/07/21 64.3 kg    BMI:  Body mass index is 24.33 kg/m.  Estimated Nutritional Needs:   Kcal:  1600-1800  Protein:  80-90 grams  Fluid:  >1.6L     Theone Stanley., MS, RD, LDN (she/her/hers) RD pager number and weekend/on-call pager number located in [place].

## 2021-12-07 NOTE — Evaluation (Addendum)
Physical Therapy Evaluation Patient Details Name: Jessica [name] MRN: [id number] DOB: 05-04-1927 Today's Date: 12/07/2021  History of Present Illness  Jessica [name] is a 88 y.o. female presenting 2/3 with c/o progressive SOB and hypoxic on RA. Work up revealed bilateral small pleural effusions and atelectasis. PMH includes: memory loss, CAD s/p PCI, chronic combined diastolic and systolic congestive heart failure, permanent A. fib on Eliquis, SSS s/p PPM, CKD stage IIIb, DM II recently diet controlled, history of breast cancer, GERD, HTN, and HLD.   Clinical Impression  Pt in bed upon arrival of PT, agreeable to evaluation at this time. The pt was unable to give reliable information regarding PLOF and assistance at home, is oriented to self and possibly place (states not at home). The pt did follow simple commands at times ("hold my hand" or "kick your leg") but was unable to perform sequential steps to complete bed mobility or transfers despite multimodal cues and increased processing time. The pt required totalA for bed mobility and transfer to recliner, recommend use of hoyer lift at this time. Will continue to benefit from skilled PT to address deficits in strength, endurance, activity tolerance, power, and stability, but will need HHPT after d/c. Met with daughter to confirm plan for d/c home rather than SNF, daughter feels prepared to assist with all ADLs and mobility.      Recommendations for follow up therapy are one component of a multi-disciplinary discharge planning process, led by the attending physician.  Recommendations may be updated based on patient status, additional functional criteria and insurance authorization.  Follow Up Recommendations Home health PT    Assistance Recommended at Discharge Frequent or constant Supervision/Assistance  Patient can return home with the following  Two people to help with walking and/or transfers;A lot of help with  bathing/dressing/bathroom;Assistance with cooking/housework;Assistance with feeding;Direct supervision/assist for medications management;Direct supervision/assist for financial management;Assist for transportation;Help with stairs or ramp for entrance    Equipment Recommendations  (hoyer lift)  Recommendations for Other Services       Functional Status Assessment Patient has had a recent decline in their functional status and demonstrates the ability to make significant improvements in function in a reasonable and predictable amount of time.     Precautions / Restrictions Precautions Precautions: Fall Precaution Comments: on 2L O2 during session Restrictions Weight Bearing Restrictions: No      Mobility  Bed Mobility Overal bed mobility: Needs Assistance Bed Mobility: Rolling, Sidelying to Sit Rolling: Total assist Sidelying to sit: Total assist, HOB elevated       General bed mobility comments: pt unable to functionally assist with rolling or bed mobility at this time, even with increased processing time or multimodal cues.    Transfers Overall transfer level: Needs assistance Equipment used: 1 person hand held assist Transfers: Bed to chair/wheelchair/BSC       Squat pivot transfers: Total assist     General transfer comment: totaA to complete squat pivot, pt unable to coordinate pushing through BLE to power up. bilateral blocking of feet/knees to prevent feet sliding    Ambulation/Gait               General Gait Details: pt unable to complete full stand on eval         Balance Overall balance assessment: Needs assistance Sitting-balance support: Bilateral upper extremity supported Sitting balance-Leahy Scale: Zero Sitting balance - Comments: pt leaning/falling to R and unable to correct without min-modA Postural control: Right lateral lean Standing balance support:  Bilateral upper extremity supported Standing balance-Leahy Scale: Zero Standing  balance comment: pt unable to coordinate movements to complete stand wtihout totalA from therapist                             Pertinent Vitals/Pain Pain Assessment Pain Assessment: Faces Faces Pain Scale: Hurts little more Pain Location: generalized with mobility Pain Descriptors / Indicators: Grimacing Pain Intervention(s): Limited activity within patient's tolerance, Monitored during session, Repositioned    Home Living Family/patient expects to be discharged to:: Private residence Living Arrangements: Children Available Help at Discharge: Family Type of Home: House Home Access: Level entry       Home Layout: One level Home Equipment: Conservation officer, nature (2 wheels);Wheelchair - manual;Hospital bed Additional Comments: pt daughter confirms hospital bed and WC, bathes pt in chair at baseline    Prior Function Prior Level of Function : Needs assist       Physical Assist : Mobility (physical);ADLs (physical) Mobility (physical): Bed mobility;Transfers ADLs (physical): Feeding;Grooming;Bathing;Dressing;Toileting;IADLs Mobility Comments: pt with less walking since november, [place] working on self-propelling [place] to transfer from bed-chair with daughter at baseline ADLs Comments: daughter assist. pt is incontinent and wears briefs/dipers. duaghter bathes and preps/cuts food that pt can feed herself     Hand Dominance   Dominant Hand: Right    Extremity/Trunk Assessment   Upper Extremity Assessment Upper Extremity Assessment: Generalized weakness    Lower Extremity Assessment Lower Extremity Assessment: Generalized weakness    Cervical / Trunk Assessment Cervical / Trunk Assessment: Kyphotic;Other exceptions Cervical / Trunk Exceptions: R lateral lean and R lateral flexion of neck, soft tissue ROM restrictions noted  Communication   Communication: HOH  Cognition Arousal/Alertness: Lethargic Behavior During Therapy: Flat affect Overall Cognitive Status:  History of cognitive impairments - at baseline                                 General Comments: pt lethargic but arouseable, answering basic orientation questions regarding her name and birthday, knows she is not at home. responded to all other questions with "yes" able to follow simple commands inconsistently, increased processing time        General Comments General comments (skin integrity, edema, etc.): VSS on 2L, BP 112/56 (70)    Exercises     Assessment/Plan    PT Assessment Patient needs continued PT services  PT Problem List Decreased strength;Decreased range of motion;Decreased activity tolerance;Decreased balance;Decreased mobility;Decreased coordination;Decreased cognition;Decreased safety awareness       PT Treatment Interventions DME instruction;Gait training;Functional mobility training;Therapeutic activities;Therapeutic exercise;Balance training;Patient/family education    PT Goals (Current goals can be found in the Care Plan section)  Acute Rehab PT Goals Patient Stated Goal: none stated PT Goal Formulation: With patient Time For Goal Achievement: 12/21/21 Potential to Achieve Goals: Fair    Frequency Min 3X/week        AM-PAC PT "6 Clicks" Mobility  Outcome Measure Help needed turning from your back to your side while in a flat bed without using bedrails?: Total Help needed moving from lying on your back to sitting on the side of a flat bed without using bedrails?: Total Help needed moving to and from a bed to a chair (including a wheelchair)?: Total Help needed standing up from a chair using your arms (e.g., wheelchair or bedside chair)?: Total Help needed to walk in hospital room?: Total Help  needed climbing 3-5 steps with a railing? : Total 6 Click Score: 6    End of Session Equipment Utilized During Treatment: Gait belt;Oxygen Activity Tolerance: Patient limited by lethargy Patient left: in chair;with call bell/phone within  reach;with chair alarm set Nurse Communication: Mobility status PT Visit Diagnosis: Other abnormalities of gait and mobility (R26.89);Unsteadiness on feet (R26.81);Muscle weakness (generalized) (M62.81)    Time: 0230-1720 PT Time Calculation (min) (ACUTE ONLY): 28 min   Charges:   PT Evaluation $PT Eval Moderate Complexity: 1 Mod PT Treatments $Therapeutic Activity: 8-22 mins        West Carbo, PT, DPT   Acute Rehabilitation Department Pager #: [phone]  Sandra Cockayne 12/07/2021, 11:13 AM

## 2021-12-07 NOTE — Progress Notes (Signed)
PROGRESS NOTE    Jessica [name]  ZPH:[id number] DOB: Jul 01, 1927 DOA: 12/06/2021 PCP: Binnie Rail, MD  Brief Narrative:88/F w/ h/o of CAD s/p PCI, chronic combined diastolic and systolic congestive heart failure, permanent A. fib on Eliquis, SSS s/p PPM, CKD stage IIIb, type 2 diabetes mellitus recent diet controlled, history of breast cancer, GERD, essential hypertension, hyperlipidemia who presents with complaints of shortness of breath over the last week.  He reported progressive decline both cognitively and functionally since she developed COVID in August, was taken off Lasix and lisinopril in January due to low blood pressures. In the ED she was tachycardic, blood pressure was 190 x 110, O2 sats were 70% on room air, BNP was 522, chest x-ray noted small pleural effusions, CTA chest noted bronchitis, small pleural effusion and displaced fracture of ribs 6 -10   Subjective: Feels okay overall, poor historian,  Assessment & Plan:  * Acute respiratory failure with hypoxia secondary to heart failure with reduced EF- (present on admission) Acute on chronic systolic CHF -Was taken off diuretics 2 weeks ago  -Admitted with hypoxia, fluid overload last echo with EF 30-35%, will repeat  -Continue IV Lasix today  -1.6 L, monitor urine output, BMP  -Wean O2 as tolerated, conservative management given advanced age, confusion and failure to thrive  Elevated troponin- (present on admission) No ACS, likely from demand in the setting of CHF  Hypertensive urgency- (present on admission) On admission blood pressures elevated at 191/114.  -Continue Coreg, BP is soft now, add midodrine  Atrial fibrillation ([place])- (present on admission) -Stable, continue Coreg and apixaban  Type 2 diabetes mellitus with diabetic neuropathy, unspecified ([place])- (present on admission) Diet controlled.  Last hemoglobin A1c was 6.7 in 11/2021. -Continue heart healthy and carb modified diet  CKD (chronic kidney disease)  stage 3, GFR 30-59 ml/min ([facility])- (present on admission) Creatinine stable -Monitor with diuresis  Memory loss- (present on admission) Patient's daughter notes that she has had progressive decline in her memory and cognitive abilities the last several months since having COVID.    Hypercalcemia- (present on admission) Calcium 10.6. -Continue to monitor calcium levels with IV diuresis.  Rib fractures- (present on admission) As noted on CT angiogram of the chest.  Suspect this is secondary to previous fall in 09/2021.  SSS (sick sinus syndrome) ([place])- (present on admission) S/p PM   DVT prophylaxis: Eliquis Code Status: DNR Family Communication: No family at bedside, will update daughter Disposition Plan:    Procedures:   Antimicrobials:    Objective: Vitals:   12/07/21 0035 12/07/21 0438 12/07/21 0736 12/07/21 0800  BP: (!) 128/58 (!) 104/44 100/85 100/85  Pulse: 62  65 69  Resp: 20 20 (!) 21 16  Temp: 99.2 F (37.3 C) 98.2 F (36.8 C) 98 F (36.7 C) 98 F (36.7 C)  TempSrc: Oral Oral Oral   SpO2: 96% 94% 92% 91%  Weight:  64.3 kg    Height:        Intake/Output Summary (Last 24 hours) at 12/07/2021 1043 Last data filed at 12/07/2021 0900 Gross per 24 hour  Intake 243 ml  Output 1950 ml  Net -1707 ml   Filed Weights   12/06/21 1635 12/07/21 0438  Weight: 65.9 kg 64.3 kg    Examination:  General exam: Chronically ill pleasant female sitting up in bed, eating breakfast, awake alert oriented to self and partly to place only, moderate cognitive deficits CVS: S1-S2, regular rate rhythm Lungs: Decreased breath sounds bases otherwise  clear Abdomen: Soft, obese, nontender, bowel sounds present Extremities: Trace edema Skin: No rashes on exposed skin Psychiatry:  Mood & affect appropriate.     Data Reviewed:   CBC: Recent Labs  Lab 12/06/21 1110  WBC 8.3  NEUTROABS 6.5  HGB 12.2  HCT 37.6  MCV 93.8  PLT 056   Basic Metabolic Panel: Recent Labs   Lab 12/06/21 1110 12/07/21 0339  NA 141 139  K 4.7 4.2  CL 114* 107  CO2 23 22  GLUCOSE 126* 143*  BUN 21 21  CREATININE 1.06* 1.11*  CALCIUM 10.6* 10.3   GFR: Estimated Creatinine Clearance: 27.9 mL/min (A) (by C-G formula based on SCr of 1.11 mg/dL (H)). Liver Function Tests: Recent Labs  Lab 12/06/21 1110  AST 25  ALT 22  ALKPHOS 112  BILITOT 1.0  PROT 6.4*  ALBUMIN 3.2*   No results for input(s): LIPASE, AMYLASE in the last 168 hours. No results for input(s): AMMONIA in the last 168 hours. Coagulation Profile: No results for input(s): INR, PROTIME in the last 168 hours. Cardiac Enzymes: No results for input(s): CKTOTAL, CKMB, CKMBINDEX, TROPONINI in the last 168 hours. BNP (last 3 results) Recent Labs    11/25/21 1506  PROBNP 860.0*   HbA1C: No results for input(s): HGBA1C in the last 72 hours. CBG: No results for input(s): GLUCAP in the last 168 hours. Lipid Profile: No results for input(s): CHOL, HDL, LDLCALC, TRIG, CHOLHDL, LDLDIRECT in the last 72 hours. Thyroid Function Tests: No results for input(s): TSH, T4TOTAL, FREET4, T3FREE, THYROIDAB in the last 72 hours. Anemia Panel: No results for input(s): VITAMINB12, FOLATE, FERRITIN, TIBC, IRON, RETICCTPCT in the last 72 hours. Urine analysis:    Component Value Date/Time   COLORURINE YELLOW 09/12/2021 1553   APPEARANCEUR CLEAR 09/12/2021 1553   LABSPEC 1.015 09/12/2021 1553   PHURINE 6.0 09/12/2021 1553   GLUCOSEU NEGATIVE 09/12/2021 1553   HGBUR NEGATIVE 09/12/2021 1553   BILIRUBINUR NEGATIVE 09/12/2021 1553   BILIRUBINUR negative 03/09/2020 1138   KETONESUR NEGATIVE 09/12/2021 1553   PROTEINUR 30 (A) 06/09/2021 2305   UROBILINOGEN 0.2 09/12/2021 1553   NITRITE Positive (A) 09/12/2021 1553   LEUKOCYTESUR Moderate (A) 09/12/2021 1553   Sepsis Labs: @LABRCNTIP (procalcitonin:4,lacticidven:4)  ) Recent Results (from the past 240 hour(s))  Resp Panel by RT-PCR (Flu A&B, Covid) Nasopharyngeal  Swab     Status: None   Collection Time: 12/06/21 12:06 PM   Specimen: Nasopharyngeal Swab; Nasopharyngeal(NP) swabs in vial transport medium  Result Value Ref Range Status   SARS Coronavirus 2 by RT PCR NEGATIVE NEGATIVE Final    Comment: (NOTE) SARS-CoV-2 target nucleic acids are NOT DETECTED.  The SARS-CoV-2 RNA is generally detectable in upper respiratory specimens during the acute phase of infection. The lowest concentration of SARS-CoV-2 viral copies this assay can detect is 138 copies/mL. A negative result does not preclude SARS-Cov-2 infection and should not be used as the sole basis for treatment or other patient management decisions. A negative result may occur with  improper specimen collection/handling, submission of specimen other than nasopharyngeal swab, presence of viral mutation(s) within the areas targeted by this assay, and inadequate number of viral copies(<138 copies/mL). A negative result must be combined with clinical observations, patient history, and epidemiological information. The expected result is Negative.  Fact Sheet for Patients:  EntrepreneurPulse.com.au  Fact Sheet for Healthcare Providers:  IncredibleEmployment.be  This test is no t yet approved or cleared by the [place] and  has been authorized for  detection and/or diagnosis of SARS-CoV-2 by FDA under an Emergency Use Authorization (EUA). This EUA will remain  in effect (meaning this test can be used) for the duration of the COVID-19 declaration under Section 564(b)(1) of the Act, 21 U.S.C.section 360bbb-3(b)(1), unless the authorization is terminated  or revoked sooner.       Influenza A by PCR NEGATIVE NEGATIVE Final   Influenza B by PCR NEGATIVE NEGATIVE Final    Comment: (NOTE) The Xpert Xpress SARS-CoV-2/FLU/RSV plus assay is intended as an aid in the diagnosis of influenza from Nasopharyngeal swab specimens and should not be used as a sole  basis for treatment. Nasal washings and aspirates are unacceptable for Xpert Xpress SARS-CoV-2/FLU/RSV testing.  Fact Sheet for Patients: EntrepreneurPulse.com.au  Fact Sheet for Healthcare Providers: IncredibleEmployment.be  This test is not yet approved or cleared by the [place] FDA and has been authorized for detection and/or diagnosis of SARS-CoV-2 by FDA under an Emergency Use Authorization (EUA). This EUA will remain in effect (meaning this test can be used) for the duration of the COVID-19 declaration under Section 564(b)(1) of the Act, 21 U.S.C. section 360bbb-3(b)(1), unless the authorization is terminated or revoked.  Performed at [facility], [place] [address], [place], [place] [address]   MRSA Next Gen by PCR, Nasal     Status: None   Collection Time: 12/06/21  4:37 PM   Specimen: Nasal Mucosa; Nasal Swab  Result Value Ref Range Status   MRSA by PCR Next Gen NOT DETECTED NOT DETECTED Final    Comment: (NOTE) The GeneXpert MRSA Assay (FDA approved for NASAL specimens only), is one component of a comprehensive MRSA colonization surveillance program. It is not intended to diagnose MRSA infection nor to guide or monitor treatment for MRSA infections. Test performance is not FDA approved in patients less than 6 years old. Performed at [facility], [place] [address], [place], [place] [address]      Radiology Studies: CT Angio Chest PE W and/or Wo Contrast  Result Date: 12/06/2021 CLINICAL DATA:  Shortness of breath EXAM: CT ANGIOGRAPHY CHEST WITH CONTRAST TECHNIQUE: Multidetector CT imaging of the chest was performed using the standard protocol during bolus administration of intravenous contrast. Multiplanar CT image reconstructions and MIPs were obtained to evaluate the vascular anatomy. RADIATION DOSE REDUCTION: This exam was performed according to the departmental dose-optimization program which includes automated  exposure control, adjustment of the mA and/or kV according to patient size and/or use of iterative reconstruction technique. CONTRAST:  16mL OMNIPAQUE IOHEXOL 350 MG/ML SOLN COMPARISON:  Chest CT dated 06/10/2021. FINDINGS: Cardiovascular: Some of the most peripheral segmental and subsegmental pulmonary artery branches are difficult to definitively characterize due to patient breathing motion artifact, however, there is no pulmonary embolism identified within the main, lobar or segmental pulmonary arteries bilaterally. Cardiomegaly. No significant pericardial effusion. Three-vessel coronary artery calcifications. No thoracic aortic aneurysm. Diffuse aortic atherosclerosis. Mediastinum/Nodes: No mass or enlarged lymph nodes are seen within the mediastinum. Esophagus is unremarkable. Trachea and central bronchi are unremarkable. Lungs/Pleura: Bibasilar atelectasis and/or chronic interstitial lung disease. Pronounced thickening of the walls of the bronchi to the bilateral lower lobes. Small bilateral pleural effusions. No evidence of consolidating pneumonia.  No pneumothorax. Upper Abdomen: No acute findings. Musculoskeletal: Displaced fractures of the LEFT lateral sixth through tenth ribs, subacute to chronic based on appearance. Review of the MIP images confirms the above findings. IMPRESSION: 1. No pulmonary embolism seen. 2. Pronounced thickening of the walls of the bronchi to  the bilateral lower lobes, suggesting acute or chronic bronchitis. No evidence of consolidating pneumonia. 3. Small bilateral pleural effusions. 4. Bibasilar atelectasis, likely superimposed on chronic interstitial lung disease. 5. Displaced fractures of the LEFT lateral sixth through tenth ribs, subacute to chronic based on appearance. 6. Cardiomegaly. Three-vessel coronary artery calcifications. Aortic Atherosclerosis (ICD10-I70.0). Electronically Signed   By: Franki Cabot M.D.   On: 12/06/2021 13:29   DG Chest Portable 1 View  Result  Date: 12/06/2021 CLINICAL DATA:  r/o PNA EXAM: PORTABLE CHEST 1 VIEW COMPARISON:  September 12, 2021. FINDINGS: Small left pleural effusion. Overlying left basilar opacities. No visible pneumothorax. Biapical pleuroparenchymal scarring. Cardiomediastinal silhouette is similar to prior. Left subclavian approach dual lead cardiac mediastinal iced. Right axillary and/or chest wall clips. IMPRESSION: Small left pleural effusion. Overlying left basilar opacities could represent atelectasis and/or pneumonia. Electronically Signed   By: Margaretha Sheffield M.D.   On: 12/06/2021 11:51     Scheduled Meds:  apixaban  5 mg Oral BID   bisacodyl  5 mg Oral QHS   carvedilol  12.5 mg Oral BID   furosemide  40 mg Intravenous BID   gabapentin  100 mg Oral QHS   gabapentin  300 mg Oral TID WC   mirtazapine  3.75 mg Oral QHS   potassium chloride SA  20 mEq Oral Daily   sodium chloride flush  3 mL Intravenous Q12H   Continuous Infusions:  sodium chloride       LOS: 1 day    Time spent:  47min  Domenic Polite, MD [facility]   12/07/2021, 10:43 AM

## 2021-12-08 ENCOUNTER — Inpatient Hospital Stay (HOSPITAL_COMMUNITY): Payer: Medicare Other

## 2021-12-08 DIAGNOSIS — I5023 Acute on chronic systolic (congestive) heart failure: Secondary | ICD-10-CM

## 2021-12-08 LAB — ECHOCARDIOGRAM COMPLETE
AR max vel: 1.75 cm2
AV Peak grad: 9.7 mmHg
Ao pk vel: 1.56 m/s
Area-P 1/2: 3.13 cm2
Height: 64 in
P 1/2 time: 527 msec
S' Lateral: 2.4 cm
Weight: 2299.84 oz

## 2021-12-08 LAB — EXPECTORATED SPUTUM ASSESSMENT W GRAM STAIN, RFLX TO RESP C

## 2021-12-08 LAB — BASIC METABOLIC PANEL
Anion gap: 10 (ref 5–15)
BUN: 33 mg/dL — ABNORMAL HIGH (ref 8–23)
CO2: 24 mmol/L (ref 22–32)
Calcium: 9.9 mg/dL (ref 8.9–10.3)
Chloride: 104 mmol/L (ref 98–111)
Creatinine, Ser: 1.5 mg/dL — ABNORMAL HIGH (ref 0.44–1.00)
GFR, Estimated: 32 mL/min — ABNORMAL LOW (ref >60)
Glucose, Bld: 124 mg/dL — ABNORMAL HIGH (ref 70–99)
Potassium: 4.5 mmol/L (ref 3.5–5.1)
Sodium: 138 mmol/L (ref 135–145)

## 2021-12-08 MED ORDER — APIXABAN 2.5 MG PO TABS
2.5000 mg | ORAL_TABLET | Freq: Two times a day (BID) | ORAL | Status: DC
  Administered 2021-12-08 – 2021-12-09 (×2): 2.5 mg via ORAL
  Filled 2021-12-08 (×2): qty 1

## 2021-12-08 NOTE — Evaluation (Signed)
Occupational Therapy Evaluation Patient Details Name: Jessica [name] MRN: [id number] DOB: 11/29/1926 Today's Date: 12/08/2021   History of Present Illness Jessica [name] is a 88 y.o. female presenting 2/3 with c/o progressive SOB and hypoxic on RA. Work up revealed bilateral small pleural effusions and atelectasis. PMH includes: memory loss, CAD s/p PCI, chronic combined diastolic and systolic congestive heart failure, permanent A. fib on Eliquis, SSS s/p PPM, CKD stage IIIb, DM II recently diet controlled, history of breast cancer, GERD, HTN, and HLD.   Clinical Impression   88 yo female admitted for above detailed illness management. Per chart review pt is dependent for ADLs at baseline. She has experienced a decline in her mobility since the end of last year when she was diagnosed with COVID-19. Pt participates in evaluation by answering simple questions and participating in transfer to EOB. She is unable to provide hx given baseline cognitive deficits but she does know she lives with her daughter. Chart review used to determine PLOF for participation in ADLs and mobility. Would recommend continued OT with goal of providing caregiver training to minimize pt fall risk, injury to caregiver and to increase pt participation in ADLs while increasing activity tolerance. Caregiver may benefit from [place] at home as well as referral for CNA services to assist with pt ADLs. Will follow acutely.     Recommendations for follow up therapy are one component of a multi-disciplinary discharge planning process, led by the attending physician.  Recommendations may be updated based on patient status, additional functional criteria and insurance authorization.   Follow Up Recommendations  Other (comment) (CNA services to assist with bathing and care at home to decrease caregiver burden)    Assistance Recommended at Discharge Frequent or constant Supervision/Assistance  Patient can return home with the following  Direct supervision/assist for medications management;Direct supervision/assist for financial management;Assistance with cooking/housework;Assistance with feeding;A lot of help with bathing/dressing/bathroom;Two people to help with walking and/or transfers    Functional Status Assessment  Patient has had a recent decline in their functional status and/or demonstrates limited ability to make significant improvements in function in a reasonable and predictable amount of time  Equipment Recommendations  Other (comment) (hoyer lift)       Precautions / Restrictions Precautions Precautions: Fall      Mobility Bed Mobility Overal bed mobility: Needs Assistance Bed Mobility: Sidelying to Sit   Sidelying to sit: Max assist       General bed mobility comments:  (elevated HOB, pt able to progress BLEs towards EOB, assist for pulling trunk upright using UEs for support)    Transfers Overall transfer level: Needs assistance Equipment used: 1 person hand held assist               General transfer comment: not attempted based on pt's difficulty progressing to EOB, pt requesting to "go back to bed" shortly after able to position to acheive indep sitting balance      Balance Overall balance assessment: Needs assistance Sitting-balance support: Feet supported, Bilateral upper extremity supported Sitting balance-Leahy Scale: Poor   Postural control: Right lateral lean                 ADL either performed or assessed with clinical judgement   ADL Overall ADL's : Needs assistance/impaired;At baseline Eating/Feeding: Set up;Bed level (based on SLP eval recommend 1:1 to monitor bite size and other swallow precautions, pt declined tray offered as she states she already ate, left tray at bedside)  Grooming: Dance movement psychotherapist;Bed level;Supervision/safety   Upper Body Bathing: Maximal assistance;Total assistance   Lower Body Bathing: Maximal assistance;Total assistance   Upper Body  Dressing : Maximal assistance;Bed level   Lower Body Dressing: Total assistance   Toilet Transfer: Total assistance   Toileting- Clothing Manipulation and Hygiene: Total assistance       Functional mobility during ADLs: Maximal assistance       Vision Ability to See in Adequate Light: 0 Adequate Patient Visual Report: No change from baseline Vision Assessment?: No apparent visual deficits            Pertinent Vitals/Pain Pain Assessment Pain Assessment: No/denies pain        Extremity/Trunk Assessment Upper Extremity Assessment Upper Extremity Assessment: Generalized weakness (ROM WFL, kyphosis limiting overhead reaching)       Cervical / Trunk Assessment Cervical / Trunk Assessment: Kyphotic;Other exceptions   Communication Communication Communication: HOH;Other (comment) (L ear better than R ear based on observation)   Cognition Arousal/Alertness: Awake/alert Behavior During Therapy: Flat affect Overall Cognitive Status: History of cognitive impairments - at baseline                                 General Comments: pt asleep on arrival but alerts to voice, HOH limiting cognitive assessment but pt reports not knowing where she is currently, does report her daughter went home, OT using PT evaluation for further information given pt poor historian     General Comments  bandage to R forearm, not thin skin and high risk for breakdown, did not assess bottom            Home Living Family/patient expects to be discharged to:: Private residence Living Arrangements: Children Available Help at Discharge: Family Type of Home: House Home Access: Level entry     Home Layout: One level     Bathroom Shower/Tub: Sponge bathes at baseline         Home Equipment: Conservation officer, nature (2 wheels);Wheelchair - manual;Hospital bed          Prior Functioning/Environment Prior Level of Function : Needs assist       Physical Assist : Mobility  (physical);ADLs (physical) Mobility (physical): Bed mobility;Transfers ADLs (physical): Feeding;Grooming;Bathing;Dressing;Toileting;IADLs Mobility Comments: pt with less walking since november, [place] working on self-propelling [place] to transfer from bed-chair with daughter at baseline ADLs Comments: daughter assist. pt is incontinent and wears briefs/dipers. daughter bathes and preps/cuts food that pt can feed herself        OT Problem List: Decreased strength;Decreased activity tolerance;Impaired balance (sitting and/or standing)      OT Treatment/Interventions: Energy conservation;Self-care/ADL training;Therapeutic activities;Patient/family education    OT Goals(Current goals can be found in the care plan section) Acute Rehab OT Goals Time For Goal Achievement: 12/22/21 Potential to Achieve Goals: Poor ADL Goals Pt Will Perform Eating: with modified independence;with caregiver independent in assisting Pt Will Perform Upper Body Bathing: with min guard assist;with caregiver independent in assisting;sitting  OT Frequency: Min 1X/week       AM-PAC OT "6 Clicks" Daily Activity     Outcome Measure Help from another person eating meals?: A Little Help from another person taking care of personal grooming?: A Little Help from another person toileting, which includes using toliet, bedpan, or urinal?: Total Help from another person bathing (including washing, rinsing, drying)?: Total Help from another person to put on and taking off regular upper body clothing?: Total Help from  another person to put on and taking off regular lower body clothing?: Total 6 Click Score: 10   End of Session    Activity Tolerance: Patient limited by fatigue;No increased pain Patient left: with bed alarm set;in bed  OT Visit Diagnosis: Unsteadiness on feet (R26.81);Muscle weakness (generalized) (M62.81);Other abnormalities of gait and mobility (R26.89)                Time: 0034-9179 OT Time Calculation  (min): 16 min Charges:  OT General Charges $OT Visit: 1 Visit OT Evaluation $OT Eval Low Complexity: 1 Low    Kasandra Knudsen, OTR/L 12/08/2021, 12:26 PM

## 2021-12-08 NOTE — Progress Notes (Signed)
PROGRESS NOTE    Jessica [name]  GGY:[id number] DOB: 1927/06/21 DOA: 12/06/2021 PCP: Binnie Rail, MD  Brief Narrative:88/F w/ h/o of CAD s/p PCI, chronic combined diastolic and systolic congestive heart failure, permanent A. fib on Eliquis, SSS s/p PPM, CKD stage IIIb, type 2 diabetes mellitus recent diet controlled, history of breast cancer, GERD, essential hypertension, hyperlipidemia who presents with complaints of shortness of breath over the last week.  He reported progressive decline both cognitively and functionally since she developed COVID in August, was taken off Lasix and lisinopril in January due to low blood pressures. In the ED she was tachycardic, blood pressure was 190 x 110, O2 sats were 70% on room air, BNP was 522, chest x-ray noted small pleural effusions, CTA chest noted bronchitis, small pleural effusion and displaced fracture of ribs 6 -10   Subjective: -Breathing overall better, still with productive cough  Assessment & Plan:  * Acute respiratory failure with hypoxia secondary to heart failure with reduced EF- (present on admission) Acute on chronic systolic CHF -Was taken off diuretics 2 weeks ago  -Admitted with hypoxia, fluid overload last echo in 2018 with EF 30-35%, repeat pending -Diuresed with IV Lasix, she is 2.2 L negative -Creatinine bumped to 1.5, will hold diuretics today -Wean O2 as tolerated, conservative management given advanced age, confusion and failure to thrive -In addition,?  Dysphagia, will check SLP eval  Elevated troponin- (present on admission) No ACS, likely from demand in the setting of CHF  Hypertensive urgency- (present on admission) On admission blood pressures elevated at 191/114.  -Continue Coreg, BP is soft now, started on midodrine yesterday, now stable monitor  Atrial fibrillation ([place])- (present on admission) -Stable, continue Coreg and apixaban  Type 2 diabetes mellitus with diabetic neuropathy, unspecified ([place])-  (present on admission) Diet controlled.  Last hemoglobin A1c was 6.7 in 11/2021. -Continue heart healthy and carb modified diet  CKD (chronic kidney disease) stage 3, GFR 30-59 ml/min (HCC)- (present on admission) Creatinine stable -Monitor with diuresis  Memory loss- (present on admission) Patient's daughter notes that she has had progressive decline in her memory and cognitive abilities the last several months since having COVID.  -With ongoing failure to thrive, discussed consideration of palliative care/hospice services after discharge   Hypercalcemia- (present on admission) Calcium 10.6. -Continue to monitor calcium levels with IV diuresis.  Rib fractures- (present on admission) As noted on CT angiogram of the chest.  Suspect this is secondary to previous fall in 09/2021.  SSS (sick sinus syndrome) ([place])- (present on admission) S/p PM   DVT prophylaxis: Eliquis Code Status: DNR Family Communication: Discussed with daughter at bedside Disposition Plan: Home in 1 to 2 days   Procedures:   Antimicrobials:    Objective: Vitals:   12/08/21 0443 12/08/21 0444 12/08/21 1005 12/08/21 1054  BP:    (!) 110/56  Pulse:  72  72  Resp: 20   (!) 22  Temp: 98.7 F (37.1 C)  98.3 F (36.8 C) 98.5 F (36.9 C)  TempSrc: Oral  Oral Oral  SpO2:  91% 92% 97%  Weight:      Height:        Intake/Output Summary (Last 24 hours) at 12/08/2021 1155 Last data filed at 12/08/2021 1053 Gross per 24 hour  Intake 820 ml  Output 750 ml  Net 70 ml   Filed Weights   12/06/21 1635 12/07/21 0438 12/08/21 0006  Weight: 65.9 kg 64.3 kg 65.2 kg    Examination:  General exam: Pleasant chronically ill female sitting up in bed, awake alert, oriented to self and partly to place, moderate cognitive deficits CVS: S1-S2, regular rate rhythm Lungs: Decreased breath sounds to bases otherwise clear Abdomen: Soft, obese, nontender, bowel sounds present Extremities: No edema Skin: No rashes on  exposed skin Psychiatry:  Mood & affect appropriate.     Data Reviewed:   CBC: Recent Labs  Lab 12/06/21 1110  WBC 8.3  NEUTROABS 6.5  HGB 12.2  HCT 37.6  MCV 93.8  PLT 761   Basic Metabolic Panel: Recent Labs  Lab 12/06/21 1110 12/07/21 0339 12/08/21 0320  NA 141 139 138  K 4.7 4.2 4.5  CL 114* 107 104  CO2 23 22 24   GLUCOSE 126* 143* 124*  BUN 21 21 33*  CREATININE 1.06* 1.11* 1.50*  CALCIUM 10.6* 10.3 9.9   GFR: Estimated Creatinine Clearance: 20.7 mL/min (A) (by C-G formula based on SCr of 1.5 mg/dL (H)). Liver Function Tests: Recent Labs  Lab 12/06/21 1110  AST 25  ALT 22  ALKPHOS 112  BILITOT 1.0  PROT 6.4*  ALBUMIN 3.2*   No results for input(s): LIPASE, AMYLASE in the last 168 hours. No results for input(s): AMMONIA in the last 168 hours. Coagulation Profile: No results for input(s): INR, PROTIME in the last 168 hours. Cardiac Enzymes: No results for input(s): CKTOTAL, CKMB, CKMBINDEX, TROPONINI in the last 168 hours. BNP (last 3 results) Recent Labs    11/25/21 1506  PROBNP 860.0*   HbA1C: No results for input(s): HGBA1C in the last 72 hours. CBG: No results for input(s): GLUCAP in the last 168 hours. Lipid Profile: No results for input(s): CHOL, HDL, LDLCALC, TRIG, CHOLHDL, LDLDIRECT in the last 72 hours. Thyroid Function Tests: No results for input(s): TSH, T4TOTAL, FREET4, T3FREE, THYROIDAB in the last 72 hours. Anemia Panel: No results for input(s): VITAMINB12, FOLATE, FERRITIN, TIBC, IRON, RETICCTPCT in the last 72 hours. Urine analysis:    Component Value Date/Time   COLORURINE YELLOW 09/12/2021 1553   APPEARANCEUR CLEAR 09/12/2021 1553   LABSPEC 1.015 09/12/2021 1553   PHURINE 6.0 09/12/2021 1553   GLUCOSEU NEGATIVE 09/12/2021 1553   HGBUR NEGATIVE 09/12/2021 1553   BILIRUBINUR NEGATIVE 09/12/2021 1553   BILIRUBINUR negative 03/09/2020 [place] 09/12/2021 1553   PROTEINUR 30 (A) 06/09/2021 2305    UROBILINOGEN 0.2 09/12/2021 1553   NITRITE Positive (A) 09/12/2021 1553   LEUKOCYTESUR Moderate (A) 09/12/2021 1553   Sepsis Labs: @LABRCNTIP (procalcitonin:4,lacticidven:4)  ) Recent Results (from the past 240 hour(s))  Resp Panel by RT-PCR (Flu A&B, Covid) Nasopharyngeal Swab     Status: None   Collection Time: 12/06/21 12:06 PM   Specimen: Nasopharyngeal Swab; Nasopharyngeal(NP) swabs in vial transport medium  Result Value Ref Range Status   SARS Coronavirus 2 by RT PCR NEGATIVE NEGATIVE Final    Comment: (NOTE) SARS-CoV-2 target nucleic acids are NOT DETECTED.  The SARS-CoV-2 RNA is generally detectable in upper respiratory specimens during the acute phase of infection. The lowest concentration of SARS-CoV-2 viral copies this assay can detect is 138 copies/mL. A negative result does not preclude SARS-Cov-2 infection and should not be used as the sole basis for treatment or other patient management decisions. A negative result may occur with  improper specimen collection/handling, submission of specimen other than nasopharyngeal swab, presence of viral mutation(s) within the areas targeted by this assay, and inadequate number of viral copies(<138 copies/mL). A negative result must be combined with clinical observations, patient history,  and epidemiological information. The expected result is Negative.  Fact Sheet for Patients:  EntrepreneurPulse.com.au  Fact Sheet for Healthcare Providers:  IncredibleEmployment.be  This test is no t yet approved or cleared by the [place] FDA and  has been authorized for detection and/or diagnosis of SARS-CoV-2 by FDA under an Emergency Use Authorization (EUA). This EUA will remain  in effect (meaning this test can be used) for the duration of the COVID-19 declaration under Section 564(b)(1) of the Act, 21 U.S.C.section 360bbb-3(b)(1), unless the authorization is terminated  or revoked sooner.        Influenza A by PCR NEGATIVE NEGATIVE Final   Influenza B by PCR NEGATIVE NEGATIVE Final    Comment: (NOTE) The Xpert Xpress SARS-CoV-2/FLU/RSV plus assay is intended as an aid in the diagnosis of influenza from Nasopharyngeal swab specimens and should not be used as a sole basis for treatment. Nasal washings and aspirates are unacceptable for Xpert Xpress SARS-CoV-2/FLU/RSV testing.  Fact Sheet for Patients: EntrepreneurPulse.com.au  Fact Sheet for Healthcare Providers: IncredibleEmployment.be  This test is not yet approved or cleared by the [place] FDA and has been authorized for detection and/or diagnosis of SARS-CoV-2 by FDA under an Emergency Use Authorization (EUA). This EUA will remain in effect (meaning this test can be used) for the duration of the COVID-19 declaration under Section 564(b)(1) of the Act, 21 U.S.C. section 360bbb-3(b)(1), unless the authorization is terminated or revoked.  Performed at [facility], [place] [address] [place], [place] [address]   MRSA Next Gen by PCR, Nasal     Status: None   Collection Time: 12/06/21  4:37 PM   Specimen: Nasal Mucosa; Nasal Swab  Result Value Ref Range Status   MRSA by PCR Next Gen NOT DETECTED NOT DETECTED Final    Comment: (NOTE) The GeneXpert MRSA Assay (FDA approved for NASAL specimens only), is one component of a comprehensive MRSA colonization surveillance program. It is not intended to diagnose MRSA infection nor to guide or monitor treatment for MRSA infections. Test performance is not FDA approved in patients less than 66 years old. Performed at [facility], [place] [address] [place], [place] [address]   Expectorated Sputum Assessment w Gram Stain, Rflx to Resp Cult     Status: None   Collection Time: 12/07/21  6:26 PM   Specimen: Expectorated Sputum  Result Value Ref Range Status   Specimen Description EXPECTORATED SPUTUM  Final   Special Requests  EXPECTORATED SPUTUM  Final   Sputum evaluation   Final    THIS SPECIMEN IS ACCEPTABLE FOR SPUTUM CULTURE Performed at [facility], [place] [address], [place], [place] [address]    Report Status 12/08/2021 FINAL  Final  Culture, Respiratory w Gram Stain     Status: None (Preliminary result)   Collection Time: 12/07/21  6:26 PM  Result Value Ref Range Status   Specimen Description EXPECTORATED SPUTUM  Final   Special Requests EXPECTORATED SPUTUM Reflexed from [id number]  Final   Gram Stain   Final    ABUNDANT WBC PRESENT,BOTH PMN AND MONONUCLEAR FEW GRAM POSITIVE COCCI RARE GRAM VARIABLE ROD RARE YEAST Performed at [facility], [place] [address] [place], [place] [address]    Culture PENDING  Incomplete   Report Status PENDING  Incomplete     Radiology Studies: CT Angio Chest PE W and/or Wo Contrast  Result Date: 12/06/2021 CLINICAL DATA:  Shortness of breath EXAM: CT ANGIOGRAPHY CHEST WITH CONTRAST TECHNIQUE: Multidetector  CT imaging of the chest was performed using the standard protocol during bolus administration of intravenous contrast. Multiplanar CT image reconstructions and MIPs were obtained to evaluate the vascular anatomy. RADIATION DOSE REDUCTION: This exam was performed according to the departmental dose-optimization program which includes automated exposure control, adjustment of the mA and/or kV according to patient size and/or use of iterative reconstruction technique. CONTRAST:  73mL OMNIPAQUE IOHEXOL 350 MG/ML SOLN COMPARISON:  Chest CT dated 06/10/2021. FINDINGS: Cardiovascular: Some of the most peripheral segmental and subsegmental pulmonary artery branches are difficult to definitively characterize due to patient breathing motion artifact, however, there is no pulmonary embolism identified within the main, lobar or segmental pulmonary arteries bilaterally. Cardiomegaly. No significant pericardial effusion. Three-vessel coronary artery calcifications. No thoracic aortic  aneurysm. Diffuse aortic atherosclerosis. Mediastinum/Nodes: No mass or enlarged lymph nodes are seen within the mediastinum. Esophagus is unremarkable. Trachea and central bronchi are unremarkable. Lungs/Pleura: Bibasilar atelectasis and/or chronic interstitial lung disease. Pronounced thickening of the walls of the bronchi to the bilateral lower lobes. Small bilateral pleural effusions. No evidence of consolidating pneumonia.  No pneumothorax. Upper Abdomen: No acute findings. Musculoskeletal: Displaced fractures of the LEFT lateral sixth through tenth ribs, subacute to chronic based on appearance. Review of the MIP images confirms the above findings. IMPRESSION: 1. No pulmonary embolism seen. 2. Pronounced thickening of the walls of the bronchi to the bilateral lower lobes, suggesting acute or chronic bronchitis. No evidence of consolidating pneumonia. 3. Small bilateral pleural effusions. 4. Bibasilar atelectasis, likely superimposed on chronic interstitial lung disease. 5. Displaced fractures of the LEFT lateral sixth through tenth ribs, subacute to chronic based on appearance. 6. Cardiomegaly. Three-vessel coronary artery calcifications. Aortic Atherosclerosis (ICD10-I70.0). Electronically Signed   By: Franki Cabot M.D.   On: 12/06/2021 13:29     Scheduled Meds:  apixaban  5 mg Oral BID   bisacodyl  5 mg Oral QHS   carvedilol  12.5 mg Oral BID   gabapentin  100 mg Oral QHS   gabapentin  300 mg Oral BID   midodrine  5 mg Oral BID WC   mirtazapine  3.75 mg Oral QHS   potassium chloride SA  20 mEq Oral Daily   sodium chloride flush  3 mL Intravenous Q12H   Continuous Infusions:  sodium chloride       LOS: 2 days    Time spent:  70min  Domenic Polite, MD [facility]   12/08/2021, 11:55 AM

## 2021-12-08 NOTE — Progress Notes (Signed)
Echocardiogram 2D Echocardiogram has been performed.  Jefferey Pica 12/08/2021, 11:50 AM

## 2021-12-08 NOTE — TOC Progression Note (Addendum)
Transition of Care [facility]) - Progression Note    Patient Details  Name: Jessica [name] MRN: [id number] Date of Birth: 30-Jan-1927  [facility] CM/SW Contact  Zenon Mayo, RN Phone Number: 12/08/2021, 3:31 PM  Clinical Narrative:    NCM spoke with daughter Jessica [name], patient lives with her.  Jessica [name] is her support person and is there 24/7 every day. Jessica [name] states she also has a close friend who will come to sit with her if needs to go to a MD apt or grocery store. Jessica [name] states they have a transfer chair, hospital bed, bsc, walker, rollator, shower stool, and home oxygen 2 liters ( [employer]) and a w/chairk.  Jessica [name] states she will transport her home by car at discharge. She is active with [facility] for HHPT, [place] and would like to continue with them. NCM confirmed with Levada Dy with [facility] just signed off 2 or 3 weeks ago. Will need orders.   Expected Discharge Plan: [place] Barriers to Discharge: Continued Medical Work up  Expected Discharge Plan and Services Expected Discharge Plan: [place] In-house Referral: NA Discharge Planning Services: CM Consult Post Acute Care Choice: Home Health, Resumption of Svcs/PTA Provider Living arrangements for the past 2 months: Single Family Home                   DME Agency: NA       HH Arranged: PT, OT HH Agency:  ([facility]) Date [place]: 12/08/21 Time [place]: 1530 Representative spoke with at [place]: [place] ([place]) Interventions    Readmission Risk Interventions No flowsheet data found.

## 2021-12-08 NOTE — Evaluation (Signed)
Clinical/Bedside Swallow Evaluation Patient Details  Name: Jessica [name] MRN: [id number] Date of Birth: 02-12-1927  Today's Date: 12/08/2021 Time: SLP Start Time (ACUTE ONLY): 1039 SLP Stop Time (ACUTE ONLY): 2482 SLP Time Calculation (min) (ACUTE ONLY): 14 min  Past Medical History:  Past Medical History:  Diagnosis Date   Breast cancer ([place])    CAD (coronary artery disease)    a. s/p DESx2 to mid and distal RCA in 2010.   Cholelithiasis 09/12/2013   Lap Chole on 09/14/13    Chronic combined systolic and diastolic CHF (congestive heart failure) ([place])    a. Echo 06/09/15: EF 55-60% -> 10/2017 EF 30-35% (pt and daughter did not wish to proceed with ischemic assessment - conservative rx).   CKD (chronic kidney disease), stage III (HCC)    Diabetes mellitus    NON INSULIN DEPENDENT   DJD (degenerative joint disease)    GERD (gastroesophageal reflux disease)    Hemorrhoids    History of diabetic neuropathy    Hypercholesterolemia    Hypertension    Hypertensive heart disease    Permanent atrial fibrillation ([place])    a. discovered on PPM interrogation 12/15- > eventually progressed to 100% atrial fib burden/permanent atrial fib.   SSS (sick sinus syndrome) (HCC)    a. s/p [employer] PPM by JA for SSS and syncope 9/11.   Past Surgical History:  Past Surgical History:  Procedure Laterality Date   CARDIAC CATHETERIZATION  07/05/2009   EF 55-60%   CARDIOVERSION N/A 06/11/2015   Procedure: CARDIOVERSION;  Surgeon: [name], MD;  Location: [facility];  Service: Cardiovascular;  Laterality: N/A;   CHOLECYSTECTOMY N/A 09/14/2013   Procedure: LAPAROSCOPIC CHOLECYSTECTOMY WITH INTRAOPERATIVE CHOLANGIOGRAM;  Surgeon: [name], MD;  Location: [facility];  Service: General;  Laterality: N/A;   COLONOSCOPY     ERCP N/A 09/13/2013   Procedure: ENDOSCOPIC RETROGRADE CHOLANGIOPANCREATOGRAPHY (ERCP);  Surgeon: [name], MD;  Location: [facility] ENDOSCOPY;  Service: Endoscopy;  Laterality:  N/A;   INSERT / REPLACE / REMOVE PACEMAKER  9/11   SSS and syncope, implanted by JA (MDT)   MASTECTOMY  1992   BILATERAL WITH RECONSTRUCTION   HPI:  Jessica [name] is a 88 y.o. female presenting 2/3 with c/o progressive SOB and hypoxic on RA. Work up revealed bilateral small pleural effusions and atelectasis. PMH includes: memory loss, CAD s/p PCI, chronic combined diastolic and systolic congestive heart failure, permanent A. fib on Eliquis, SSS s/p PPM, CKD stage IIIb, DM II recently diet controlled, history of breast cancer, GERD, HTN, and HLD.    Assessment / Plan / Recommendation  Clinical Impression  Pt presents with functional swallowing despite dementia.  Oral mechanism exam was normal; dentures in place. She was able to follow oral motor commands.  There were no focal deficits.  Ms. Blankenbaker demonstrated active and thorough mastication of crackers and peaches with no residue post-swallow.  There was occasional oral holding, not unusual with cognitive deficits, requiring a cue to swallow.  Swallow response was palpable and there were no s/s of aspiration with mixed solid and liquid consistencies.  Recommend continuing current diet; provide tray set-up and supervision with meals to remind pt to swallow when she holds items in her mouth.  She does not need f/u SLP; our service will sign off. SLP Visit Diagnosis: Dysphagia, oral phase (R13.11)    Aspiration Risk  No limitations    Diet Recommendation   Regular solids, thin liquids  Medication Administration: Whole meds  with liquid - if having difficulty, give with applesauce   Other  Recommendations Oral Care Recommendations: Oral care BID    Recommendations for follow up therapy are one component of a multi-disciplinary discharge planning process, led by the attending physician.  Recommendations may be updated based on patient status, additional functional criteria and insurance authorization.  Follow up Recommendations No SLP follow up       Assistance Recommended at Discharge    Functional Status Assessment    Frequency and Duration            Prognosis        Swallow Study   General HPI: Jessica [name] is a 88 y.o. female presenting 2/3 with c/o progressive SOB and hypoxic on RA. Work up revealed bilateral small pleural effusions and atelectasis. PMH includes: memory loss, CAD s/p PCI, chronic combined diastolic and systolic congestive heart failure, permanent A. fib on Eliquis, SSS s/p PPM, CKD stage IIIb, DM II recently diet controlled, history of breast cancer, GERD, HTN, and HLD. Type of Study: Bedside Swallow Evaluation Previous Swallow Assessment: no Diet Prior to this Study: Regular;Thin liquids Temperature Spikes Noted: No Respiratory Status: Nasal cannula History of Recent Intubation: No Behavior/Cognition: Alert;Cooperative;Pleasant mood;Confused Oral Cavity Assessment: Within Functional Limits Oral Care Completed by SLP: No Oral Cavity - Dentition: Dentures, top Vision: Functional for self-feeding Self-Feeding Abilities: Needs assist;Able to feed self Patient Positioning: Upright in bed Baseline Vocal Quality: Normal Volitional Cough: Strong Volitional Swallow: Able to elicit    Oral/Motor/Sensory Function Overall Oral Motor/Sensory Function: Within functional limits   Ice Chips Ice chips: Within functional limits   Thin Liquid Thin Liquid: Within functional limits    Nectar Thick Nectar Thick Liquid: Not tested   Honey Thick Honey Thick Liquid: Not tested   Puree Puree: Within functional limits   Solid     Solid: Within functional limits      Jessica [name] 12/08/2021,10:57 AM  Jessica [name], [place] Office number [phone] Pager [phone]

## 2021-12-08 NOTE — Plan of Care (Signed)
  Problem: Education: Goal: Knowledge of General Education information will improve Description: Including pain rating scale, medication(s)/side effects and non-pharmacologic comfort measures Outcome: Progressing   Problem: Clinical Measurements: Goal: Ability to maintain clinical measurements within normal limits will improve Outcome: Progressing Goal: Diagnostic test results will improve Outcome: Progressing   

## 2021-12-09 ENCOUNTER — Inpatient Hospital Stay (HOSPITAL_COMMUNITY): Payer: Medicare Other

## 2021-12-09 DIAGNOSIS — J189 Pneumonia, unspecified organism: Secondary | ICD-10-CM | POA: Diagnosis not present

## 2021-12-09 LAB — CBC
HCT: 36.4 % (ref 36.0–46.0)
Hemoglobin: 12.1 g/dL (ref 12.0–15.0)
MCH: 30.8 pg (ref 26.0–34.0)
MCHC: 33.2 g/dL (ref 30.0–36.0)
MCV: 92.6 fL (ref 80.0–100.0)
Platelets: 206 10*3/uL (ref 150–400)
RBC: 3.93 MIL/uL (ref 3.87–5.11)
RDW: 16.1 % — ABNORMAL HIGH (ref 11.5–15.5)
WBC: 6.6 10*3/uL (ref 4.0–10.5)
nRBC: 0 % (ref 0.0–0.2)

## 2021-12-09 LAB — BASIC METABOLIC PANEL
Anion gap: 8 (ref 5–15)
BUN: 43 mg/dL — ABNORMAL HIGH (ref 8–23)
CO2: 22 mmol/L (ref 22–32)
Calcium: 9.4 mg/dL (ref 8.9–10.3)
Chloride: 103 mmol/L (ref 98–111)
Creatinine, Ser: 1.28 mg/dL — ABNORMAL HIGH (ref 0.44–1.00)
GFR, Estimated: 39 mL/min — ABNORMAL LOW (ref >60)
Glucose, Bld: 128 mg/dL — ABNORMAL HIGH (ref 70–99)
Potassium: 4.7 mmol/L (ref 3.5–5.1)
Sodium: 133 mmol/L — ABNORMAL LOW (ref 135–145)

## 2021-12-09 MED ORDER — APIXABAN 5 MG PO TABS
5.0000 mg | ORAL_TABLET | Freq: Two times a day (BID) | ORAL | Status: DC
  Administered 2021-12-09 – 2021-12-12 (×6): 5 mg via ORAL
  Filled 2021-12-09 (×6): qty 1

## 2021-12-09 MED ORDER — GUAIFENESIN ER 600 MG PO TB12
600.0000 mg | ORAL_TABLET | Freq: Two times a day (BID) | ORAL | Status: DC
  Administered 2021-12-09 – 2021-12-12 (×7): 600 mg via ORAL
  Filled 2021-12-09 (×7): qty 1

## 2021-12-09 MED ORDER — SODIUM CHLORIDE 0.9 % IV SOLN
1.0000 g | INTRAVENOUS | Status: DC
  Administered 2021-12-09 – 2021-12-12 (×4): 1 g via INTRAVENOUS
  Filled 2021-12-09 (×4): qty 10

## 2021-12-09 MED ORDER — IPRATROPIUM-ALBUTEROL 0.5-2.5 (3) MG/3ML IN SOLN
3.0000 mL | Freq: Three times a day (TID) | RESPIRATORY_TRACT | Status: DC
  Administered 2021-12-10 – 2021-12-12 (×8): 3 mL via RESPIRATORY_TRACT
  Filled 2021-12-09 (×8): qty 3

## 2021-12-09 MED ORDER — AZITHROMYCIN 250 MG PO TABS
250.0000 mg | ORAL_TABLET | Freq: Every day | ORAL | Status: DC
  Administered 2021-12-09 – 2021-12-12 (×4): 250 mg via ORAL
  Filled 2021-12-09 (×4): qty 1

## 2021-12-09 MED ORDER — FUROSEMIDE 40 MG PO TABS
40.0000 mg | ORAL_TABLET | Freq: Every day | ORAL | Status: DC
  Administered 2021-12-09 – 2021-12-10 (×2): 40 mg via ORAL
  Filled 2021-12-09 (×2): qty 1

## 2021-12-09 MED ORDER — IPRATROPIUM-ALBUTEROL 0.5-2.5 (3) MG/3ML IN SOLN
3.0000 mL | Freq: Four times a day (QID) | RESPIRATORY_TRACT | Status: DC
  Administered 2021-12-09 (×2): 3 mL via RESPIRATORY_TRACT
  Filled 2021-12-09 (×2): qty 3

## 2021-12-09 NOTE — Progress Notes (Signed)
Heart Failure Navigator Progress Note  Assessed for Heart & Vascular TOC clinic readiness.  Patient does not meet criteria due to significant confusion. Pt unable to answer if she lives at home--unsure if she is able to hear me. Not able to have conversation, ?confusion vs hearing difficulties.  Noted audible rattle with inhale, wet weak cough. No plans for HV [facility] clinic upon discharge.   Navigator available for reassessment of patient.   Pricilla Holm, MSN, RN Heart Failure Nurse Navigator [phone]

## 2021-12-09 NOTE — Progress Notes (Signed)
PROGRESS NOTE    Jessica [name]  LOV:[id number] DOB: Aug 12, 1927 DOA: 12/06/2021 PCP: Binnie Rail, MD  Brief Narrative:88/F w/ h/o of CAD s/p PCI, chronic combined diastolic and systolic congestive heart failure, permanent A. fib on Eliquis, SSS s/p PPM, CKD stage IIIb, type 2 diabetes mellitus recent diet controlled, history of breast cancer, GERD, essential hypertension, hyperlipidemia who presents with complaints of shortness of breath over the last week.  He reported progressive decline both cognitively and functionally since she developed COVID in August, was taken off Lasix and lisinopril in January due to low blood pressures. In the ED she was tachycardic, blood pressure was 190 x 110, O2 sats were 70% on room air, BNP was 522, chest x-ray noted small pleural effusions, CTA chest noted bronchitis, small pleural effusion and displaced fracture of ribs 6 -10 -Improved with diuretics, hospital course complicated by worsening productive cough, low-grade fever, repeat chest x-ray notable for left lower lobe infiltrate, started antibiotics 2/6   Subjective: -Continues to have significant congested cough  Assessment & Plan:  * Acute respiratory failure with hypoxia secondary to heart failure with reduced EF- (present on admission) Acute on chronic systolic CHF -Was taken off diuretics 2 weeks ago  -Admitted with hypoxia, fluid overload last echo in 2018 with EF 30-35%, repeat pending -Diuresed with IV Lasix, she is 2.2 L negative -Creatinine bumped to 1.5, diuretics held yesterday and then resumed -Also suspect a component of pneumonia, low-grade temps with ongoing productive cough and diffuse lower lung rhonchi, repeat chest x-ray concerning for pneumonia will add ceftriaxone, azithromycin  CAP (community acquired pneumonia) - Start ABX, ceftriaxone/azithromycin, add DuoNebs -Add Mucinex -Status post SLP eval yesterday, no concerns for dysphagia noted  Elevated troponin- (present on  admission) No ACS, likely from demand in the setting of CHF  Hypertensive urgency- (present on admission) On admission blood pressures elevated at 191/114.  -Continue Coreg, BP is soft now, started on midodrine yesterday, now stable monitor  Atrial fibrillation ([place])- (present on admission) -Stable, continue Coreg and apixaban  Type 2 diabetes mellitus with diabetic neuropathy, unspecified ([place])- (present on admission) Diet controlled.  Last hemoglobin A1c was 6.7 in 11/2021. -Continue heart healthy and carb modified diet  CKD (chronic kidney disease) stage 3, GFR 30-59 ml/min (HCC)- (present on admission) Creatinine stable -Monitor with diuresis  Memory loss- (present on admission) Patient's daughter notes that she has had progressive decline in her memory and cognitive abilities the last several months since having COVID.  -With ongoing failure to thrive, discussed consideration of palliative care/hospice services after discharge   Hypercalcemia- (present on admission) Calcium 10.6. -Continue to monitor calcium levels with IV diuresis.  Rib fractures- (present on admission) As noted on CT angiogram of the chest.  Suspect this is secondary to previous fall in 09/2021.  SSS (sick sinus syndrome) ([name])- (present on admission) S/p PM   DVT prophylaxis: Eliquis Code Status: DNR Family Communication: No family at bedside, called and updated daughter Disposition Plan: Home in 48 hours if stable   Procedures:   Antimicrobials:    Objective: Vitals:   12/08/21 1926 12/09/21 0430 12/09/21 0922 12/09/21 1149  BP: (!) 129/57 (!) 115/55  (!) 100/50  Pulse: 71 81  61  Resp: 20 20  19   Temp: 99.6 F (37.6 C) 97.9 F (36.6 C)  99.2 F (37.3 C)  TempSrc: Oral Oral  Oral  SpO2: 90% 94% 96% 90%  Weight:  64.1 kg    Height:  Intake/Output Summary (Last 24 hours) at 12/09/2021 1503 Last data filed at 12/09/2021 1249 Gross per 24 hour  Intake 620 ml  Output 300 ml  Net  320 ml   Filed Weights   12/07/21 0438 12/08/21 0006 12/09/21 0430  Weight: 64.3 kg 65.2 kg 64.1 kg    Examination:  General exam: Pleasant chronically ill female sitting up in bed, awake alert, oriented to self and partly to place, moderate cognitive deficits CVS: S1-S2, regular rate rhythm Lungs: Decreased breath sounds to bases otherwise clear Abdomen: Soft, obese, nontender, bowel sounds present Extremities: No edema Skin: No rashes on exposed skin Psychiatry:  Mood & affect appropriate.     Data Reviewed:   CBC: Recent Labs  Lab 12/06/21 1110 12/09/21 0205  WBC 8.3 6.6  NEUTROABS 6.5  --   HGB 12.2 12.1  HCT 37.6 36.4  MCV 93.8 92.6  PLT 274 026   Basic Metabolic Panel: Recent Labs  Lab 12/06/21 1110 12/07/21 0339 12/08/21 0320 12/09/21 0205  NA 141 139 138 133*  K 4.7 4.2 4.5 4.7  CL 114* 107 104 103  CO2 23 22 24 22   GLUCOSE 126* 143* 124* 128*  BUN 21 21 33* 43*  CREATININE 1.06* 1.11* 1.50* 1.28*  CALCIUM 10.6* 10.3 9.9 9.4   GFR: Estimated Creatinine Clearance: 24.2 mL/min (A) (by C-G formula based on SCr of 1.28 mg/dL (H)). Liver Function Tests: Recent Labs  Lab 12/06/21 1110  AST 25  ALT 22  ALKPHOS 112  BILITOT 1.0  PROT 6.4*  ALBUMIN 3.2*   No results for input(s): LIPASE, AMYLASE in the last 168 hours. No results for input(s): AMMONIA in the last 168 hours. Coagulation Profile: No results for input(s): INR, PROTIME in the last 168 hours. Cardiac Enzymes: No results for input(s): CKTOTAL, CKMB, CKMBINDEX, TROPONINI in the last 168 hours. BNP (last 3 results) Recent Labs    11/25/21 1506  PROBNP 860.0*   HbA1C: No results for input(s): HGBA1C in the last 72 hours. CBG: No results for input(s): GLUCAP in the last 168 hours. Lipid Profile: No results for input(s): CHOL, HDL, LDLCALC, TRIG, CHOLHDL, LDLDIRECT in the last 72 hours. Thyroid Function Tests: No results for input(s): TSH, T4TOTAL, FREET4, T3FREE, THYROIDAB in the  last 72 hours. Anemia Panel: No results for input(s): VITAMINB12, FOLATE, FERRITIN, TIBC, IRON, RETICCTPCT in the last 72 hours. Urine analysis:    Component Value Date/Time   COLORURINE YELLOW 09/12/2021 1553   APPEARANCEUR CLEAR 09/12/2021 1553   LABSPEC 1.015 09/12/2021 1553   PHURINE 6.0 09/12/2021 1553   GLUCOSEU NEGATIVE 09/12/2021 1553   HGBUR NEGATIVE 09/12/2021 1553   BILIRUBINUR NEGATIVE 09/12/2021 1553   BILIRUBINUR negative 03/09/2020 [place] 09/12/2021 1553   PROTEINUR 30 (A) 06/09/2021 2305   UROBILINOGEN 0.2 09/12/2021 1553   NITRITE Positive (A) 09/12/2021 1553   LEUKOCYTESUR Moderate (A) 09/12/2021 1553   Sepsis Labs: @LABRCNTIP (procalcitonin:4,lacticidven:4)  ) Recent Results (from the past 240 hour(s))  Resp Panel by RT-PCR (Flu A&B, Covid) Nasopharyngeal Swab     Status: None   Collection Time: 12/06/21 12:06 PM   Specimen: Nasopharyngeal Swab; Nasopharyngeal(NP) swabs in vial transport medium  Result Value Ref Range Status   SARS Coronavirus 2 by RT PCR NEGATIVE NEGATIVE Final    Comment: (NOTE) SARS-CoV-2 target nucleic acids are NOT DETECTED.  The SARS-CoV-2 RNA is generally detectable in upper respiratory specimens during the acute phase of infection. The lowest concentration of SARS-CoV-2 viral copies this assay  can detect is 138 copies/mL. A negative result does not preclude SARS-Cov-2 infection and should not be used as the sole basis for treatment or other patient management decisions. A negative result may occur with  improper specimen collection/handling, submission of specimen other than nasopharyngeal swab, presence of viral mutation(s) within the areas targeted by this assay, and inadequate number of viral copies(<138 copies/mL). A negative result must be combined with clinical observations, patient history, and epidemiological information. The expected result is Negative.  Fact Sheet for Patients:   EntrepreneurPulse.com.au  Fact Sheet for Healthcare Providers:  IncredibleEmployment.be  This test is no t yet approved or cleared by the [place] FDA and  has been authorized for detection and/or diagnosis of SARS-CoV-2 by FDA under an Emergency Use Authorization (EUA). This EUA will remain  in effect (meaning this test can be used) for the duration of the COVID-19 declaration under Section 564(b)(1) of the Act, 21 U.S.C.section 360bbb-3(b)(1), unless the authorization is terminated  or revoked sooner.       Influenza A by PCR NEGATIVE NEGATIVE Final   Influenza B by PCR NEGATIVE NEGATIVE Final    Comment: (NOTE) The Xpert Xpress SARS-CoV-2/FLU/RSV plus assay is intended as an aid in the diagnosis of influenza from Nasopharyngeal swab specimens and should not be used as a sole basis for treatment. Nasal washings and aspirates are unacceptable for Xpert Xpress SARS-CoV-2/FLU/RSV testing.  Fact Sheet for Patients: EntrepreneurPulse.com.au  Fact Sheet for Healthcare Providers: IncredibleEmployment.be  This test is not yet approved or cleared by the [place] FDA and has been authorized for detection and/or diagnosis of SARS-CoV-2 by FDA under an Emergency Use Authorization (EUA). This EUA will remain in effect (meaning this test can be used) for the duration of the COVID-19 declaration under Section 564(b)(1) of the Act, 21 U.S.C. section 360bbb-3(b)(1), unless the authorization is terminated or revoked.  Performed at [facility], [place] [address], [place], [place] [address]   MRSA Next Gen by PCR, Nasal     Status: None   Collection Time: 12/06/21  4:37 PM   Specimen: Nasal Mucosa; Nasal Swab  Result Value Ref Range Status   MRSA by PCR Next Gen NOT DETECTED NOT DETECTED Final    Comment: (NOTE) The GeneXpert MRSA Assay (FDA approved for NASAL specimens only), is one component of a  comprehensive MRSA colonization surveillance program. It is not intended to diagnose MRSA infection nor to guide or monitor treatment for MRSA infections. Test performance is not FDA approved in patients less than 63 years old. Performed at [facility], [place] [address] [place], [place] [address]   Expectorated Sputum Assessment w Gram Stain, Rflx to Resp Cult     Status: None   Collection Time: 12/07/21  6:26 PM   Specimen: Expectorated Sputum  Result Value Ref Range Status   Specimen Description EXPECTORATED SPUTUM  Final   Special Requests EXPECTORATED SPUTUM  Final   Sputum evaluation   Final    THIS SPECIMEN IS ACCEPTABLE FOR SPUTUM CULTURE Performed at [facility], [place] [address], [place], [place] [address]    Report Status 12/08/2021 FINAL  Final  Culture, Respiratory w Gram Stain     Status: None (Preliminary result)   Collection Time: 12/07/21  6:26 PM  Result Value Ref Range Status   Specimen Description EXPECTORATED SPUTUM  Final   Special Requests EXPECTORATED SPUTUM Reflexed from [id number]  Final   Gram Stain   Final  ABUNDANT WBC PRESENT,BOTH PMN AND MONONUCLEAR FEW GRAM POSITIVE COCCI RARE GRAM VARIABLE ROD RARE YEAST    Culture   Final    CULTURE REINCUBATED FOR BETTER GROWTH Performed at [facility], [place] [address], [place], [place] [address]    Report Status PENDING  Incomplete     Radiology Studies: DG CHEST PORT 1 VIEW  Result Date: 12/09/2021 CLINICAL DATA:  Cough EXAM: PORTABLE CHEST 1 VIEW COMPARISON:  12/06/2021 FINDINGS: Left lower lobe airspace disease. Bilateral upper lobe interstitial thickening which may reflect developing interstitial infection or interstitial edema. No pleural effusion or pneumothorax. Stable cardiomediastinal silhouette. Dual lead cardiac pacemaker. Thoracic aortic atherosclerosis. No acute osseous abnormality. IMPRESSION: 1. Left lower lobe airspace disease concerning for pneumonia. 2. Bilateral upper lobe  interstitial thickening which may reflect developing interstitial infection or interstitial edema. Electronically Signed   By: Kathreen Devoid M.D.   On: 12/09/2021 09:22   ECHOCARDIOGRAM COMPLETE  Result Date: 12/08/2021    ECHOCARDIOGRAM REPORT   Patient Name:   Jessica [name] Date of Exam: 12/08/2021 Medical Rec #:  [id number]      Height:       64.0 in Accession #:    [id number]     Weight:       143.7 lb Date of Birth:  1927/08/23      BSA:          1.700 m Patient Age:    87 years       BP:           110/56 mmHg Patient Gender: F              HR:           75 bpm. Exam Location:  Inpatient Procedure: 2D Echo Indications:    CHF  History:        Patient has prior history of Echocardiogram examinations, most                 recent 10/15/2017. Arrythmias:Atrial Fibrillation; Risk                 Factors:Diabetes.  Sonographer:    Jefferey Pica Referring Phys: [id number] RONDELL A SMITH IMPRESSIONS  1. Left ventricular ejection fraction, by estimation, is 60 to 65%. The left ventricle has normal function. The left ventricle has no regional wall motion abnormalities. There is mild left ventricular hypertrophy. Left ventricular diastolic function could not be evaluated.  2. Right ventricular systolic function is moderately reduced. The right ventricular size is mildly enlarged. There is moderately elevated pulmonary artery systolic pressure. The estimated right ventricular systolic pressure is 12.8 mmHg.  3. Left atrial size was mildly dilated.  4. The mitral valve is abnormal. Trivial mitral valve regurgitation.  5. The aortic valve is tricuspid. Aortic valve regurgitation is trivial. Aortic valve sclerosis/calcification is present, without any evidence of aortic stenosis.  6. The inferior vena cava is normal in size with <50% respiratory variability, suggesting right atrial pressure of 8 mmHg. Comparison(s): No prior Echocardiogram. FINDINGS  Left Ventricle: Left ventricular ejection fraction, by estimation, is 60 to  65%. The left ventricle has normal function. The left ventricle has no regional wall motion abnormalities. The left ventricular internal cavity size was normal in size. There is  mild left ventricular hypertrophy. Left ventricular diastolic function could not be evaluated due to atrial fibrillation. Left ventricular diastolic function could not be evaluated. Right Ventricle: The right ventricular size is mildly enlarged. No increase in  right ventricular wall thickness. Right ventricular systolic function is moderately reduced. There is moderately elevated pulmonary artery systolic pressure. The tricuspid regurgitant velocity is 3.47 m/s, and with an assumed right atrial pressure of 8 mmHg, the estimated right ventricular systolic pressure is 09.8 mmHg. Left Atrium: Left atrial size was mildly dilated. Right Atrium: Right atrial size was normal in size. Pericardium: There is no evidence of pericardial effusion. Mitral Valve: The mitral valve is abnormal. There is mild thickening of the mitral valve leaflet(s). Mild mitral annular calcification. Trivial mitral valve regurgitation. Tricuspid Valve: The tricuspid valve is grossly normal. Tricuspid valve regurgitation is mild. Aortic Valve: The aortic valve is tricuspid. Aortic valve regurgitation is trivial. Aortic regurgitation PHT measures 527 msec. Aortic valve sclerosis/calcification is present, without any evidence of aortic stenosis. Aortic valve peak gradient measures 9.7 mmHg. Pulmonic Valve: The pulmonic valve was grossly normal. Pulmonic valve regurgitation is trivial. Aorta: The aortic root and ascending aorta are structurally normal, with no evidence of dilitation. Venous: The inferior vena cava is normal in size with less than 50% respiratory variability, suggesting right atrial pressure of 8 mmHg. IAS/Shunts: No atrial level shunt detected by color flow Doppler. Additional Comments: A device lead is visualized.  LEFT VENTRICLE PLAX 2D LVIDd:         3.40  cm LVIDs:         2.40 cm LV PW:         1.10 cm LV IVS:        1.00 cm LVOT diam:     2.00 cm LV SV:         51 LV SV Index:   30 LVOT Area:     3.14 cm  RIGHT VENTRICLE            IVC RV Basal diam:  2.90 cm    IVC diam: 1.90 cm RV S prime:     4.28 cm/s TAPSE (M-mode): 0.9 cm LEFT ATRIUM             Index        RIGHT ATRIUM           Index LA diam:        4.80 cm 2.82 cm/m   RA Area:     15.10 cm LA Vol (A2C):   52.9 ml 31.12 ml/m  RA Volume:   39.40 ml  23.17 ml/m LA Vol (A4C):   57.1 ml 33.59 ml/m LA Biplane Vol: 59.3 ml 34.88 ml/m  AORTIC VALVE                 PULMONIC VALVE AV Area (Vmax): 1.75 cm     PV Vmax:       0.80 m/s AV Vmax:        155.80 cm/s  PV Peak grad:  2.6 mmHg AV Peak Grad:   9.7 mmHg LVOT Vmax:      86.83 cm/s LVOT Vmean:     54.667 cm/s LVOT VTI:       0.163 m AI PHT:         527 msec  AORTA Ao Root diam: 2.80 cm Ao Asc diam:  3.20 cm MITRAL VALVE                TRICUSPID VALVE MV Area (PHT): 3.13 cm     TR Peak grad:   48.2 mmHg MV Decel Time: 242 msec     TR Vmax:        347.00 cm/s  MV E velocity: 129.00 cm/s                             SHUNTS                             Systemic VTI:  0.16 m                             Systemic Diam: 2.00 cm Lyman Bishop MD Electronically signed by Lyman Bishop MD Signature Date/Time: 12/08/2021/11:58:45 AM    Final      Scheduled Meds:  apixaban  5 mg Oral BID   azithromycin  250 mg Oral Daily   bisacodyl  5 mg Oral QHS   carvedilol  12.5 mg Oral BID   furosemide  40 mg Oral Daily   gabapentin  100 mg Oral QHS   gabapentin  300 mg Oral BID   guaiFENesin  600 mg Oral BID   ipratropium-albuterol  3 mL Nebulization QID   midodrine  5 mg Oral BID WC   mirtazapine  3.75 mg Oral QHS   sodium chloride flush  3 mL Intravenous Q12H   Continuous Infusions:  sodium chloride     cefTRIAXone (ROCEPHIN)  IV 1 g (12/09/21 1042)     LOS: 3 days    Time spent:  61min  Domenic Polite, MD [facility]   12/09/2021, 3:03 PM

## 2021-12-09 NOTE — Assessment & Plan Note (Addendum)
Left lower lobe pneumonia, and placed on antibiotic therapy with ceftriaxone/azithromycin.  Speech was consulted for swallow evaluation, with recommendations of regular solids and thin liquids with aspiration precautions.   Continue antibiotic therapy with Augmentin.

## 2021-12-09 NOTE — Progress Notes (Signed)
Physical Therapy Treatment Patient Details Name: Jessica [name] MRN: [id number] DOB: 1927-05-04 Today's Date: 12/09/2021   History of Present Illness Jessica [name] is a 88 y.o. female presenting 2/3 with c/o progressive SOB and hypoxic on RA. Work up revealed bilateral small pleural effusions and atelectasis. PMH includes: memory loss, CAD s/p PCI, chronic combined diastolic and systolic congestive heart failure, permanent A. fib on Eliquis, SSS s/p PPM, CKD stage IIIb, DM II recently diet controlled, history of breast cancer, GERD, HTN, and HLD.    PT Comments    Pt sleeping upon PT arrival to room, wakes easily but remains drowsy with flat affect throughout session. Pt tolerated  EOB sitting x10 minutes with challenges to static and dynamic sitting balance (LE and UE exercise, lateral elbow propping, facilitating upright posture) fairly well, requires quite a bit of physical assist to perform given preference for R lateral leaning. Pt overall requiring total assist for mobility at this time, could not progress to standing or OOB given poor tolerance for activity and weakness. PT encouraging hoyer lift and hospital bed if pt d/c home, PT would recommend palliative consult at this time.    Recommendations for follow up therapy are one component of a multi-disciplinary discharge planning process, led by the attending physician.  Recommendations may be updated based on patient status, additional functional criteria and insurance authorization.  Follow Up Recommendations  Home health PT (encourage palliative consult)     Assistance Recommended at Discharge Frequent or constant Supervision/Assistance  Patient can return home with the following Two people to help with walking and/or transfers;A lot of help with bathing/dressing/bathroom;Assistance with cooking/housework;Assistance with feeding;Direct supervision/assist for medications management;Direct supervision/assist for financial management;Assist  for transportation;Help with stairs or ramp for entrance   Equipment Recommendations  Hospital bed;Other (comment) (hoyer lift)    Recommendations for Other Services       Precautions / Restrictions Precautions Precautions: Fall Precaution Comments: on 2L O2 during session Restrictions Weight Bearing Restrictions: No     Mobility  Bed Mobility Overal bed mobility: Needs Assistance Bed Mobility: Supine to Sit, Sit to Supine     Supine to sit: Total assist Sit to supine: Total assist   General bed mobility comments: total assist for trunk and LE management, scooting to/from EOB, and boost up in bed upon return to supine.    Transfers                   General transfer comment: nt given difficulty with EOB sitting    Ambulation/Gait                   Stairs             Wheelchair Mobility    Modified Rankin (Stroke Patients Only)       Balance Overall balance assessment: Needs assistance Sitting-balance support: Feet supported, Bilateral upper extremity supported Sitting balance-Leahy Scale: Poor Sitting balance - Comments: R lateral leaning with heavy kyphosis; posterior leaning with dynamic activity Postural control: Right lateral lean, Posterior lean                                  Cognition Arousal/Alertness: Awake/alert Behavior During Therapy: Flat affect Overall Cognitive Status: History of cognitive impairments - at baseline  General Comments: pt sleeping upon arrival to room with dentures on chest, wakes easily. Pt follows one-step commands well with increased time, is difficult to motivate but agreeable with encouragement.        Exercises General Exercises - Upper Extremity Shoulder Flexion: AAROM, Both, 10 reps, Seated (<90 degrees given shoulder discomfort) General Exercises - Lower Extremity Long Arc Quad: AAROM, Both, 10 reps, Seated (with posterior truncal  support)    General Comments General comments (skin integrity, edema, etc.): pt with alarm for irregular HR and pair PVCs noted during session and at rest pre- and post-session. RN notified via secure chat, as pt's RN was in a code upon PT exit from pt room.      Pertinent Vitals/Pain Pain Assessment Pain Assessment: Faces Faces Pain Scale: Hurts a little bit Pain Location: generalized with mobility Pain Descriptors / Indicators: Grimacing Pain Intervention(s): Limited activity within patient's tolerance, Monitored during session, Repositioned    Home Living                          Prior Function            PT Goals (current goals can now be found in the care plan section) Acute Rehab PT Goals Patient Stated Goal: none stated PT Goal Formulation: With patient Time For Goal Achievement: 12/21/21 Potential to Achieve Goals: Fair Progress towards PT goals: Not progressing toward goals - comment (total assist)    Frequency    Min 3X/week      PT Plan Current plan remains appropriate    Co-evaluation              AM-PAC PT "6 Clicks" Mobility   Outcome Measure  Help needed turning from your back to your side while in a flat bed without using bedrails?: Total Help needed moving from lying on your back to sitting on the side of a flat bed without using bedrails?: Total Help needed moving to and from a bed to a chair (including a wheelchair)?: Total Help needed standing up from a chair using your arms (e.g., wheelchair or bedside chair)?: Total Help needed to walk in hospital room?: Total Help needed climbing 3-5 steps with a railing? : Total 6 Click Score: 6    End of Session Equipment Utilized During Treatment: Oxygen Activity Tolerance: Patient limited by fatigue Patient left: in bed;with call bell/phone within reach;with bed alarm set;Other (comment) (positioned in semi-chair position, difficult to achieve given pt kyphosis and intolerance to  upright sitting) Nurse Communication: Mobility status PT Visit Diagnosis: Other abnormalities of gait and mobility (R26.89);Unsteadiness on feet (R26.81);Muscle weakness (generalized) (M62.81)     Time: 8325-4982 PT Time Calculation (min) (ACUTE ONLY): 24 min  Charges:  $Therapeutic Activity: 8-22 mins $Neuromuscular Re-education: 8-22 mins                     Stacie Glaze, PT DPT Acute Rehabilitation Services Pager [phone]  Office [phone]    Roxine Caddy E Ruffin Pyo 12/09/2021, 4:50 PM

## 2021-12-09 NOTE — Plan of Care (Signed)
  Problem: Coping: Goal: Level of anxiety will decrease Outcome: Progressing   Problem: Pain Managment: Goal: General experience of comfort will improve Outcome: Progressing   Problem: Safety: Goal: Ability to remain free from injury will improve Outcome: Progressing   

## 2021-12-10 DIAGNOSIS — Z7189 Other specified counseling: Secondary | ICD-10-CM

## 2021-12-10 DIAGNOSIS — I5043 Acute on chronic combined systolic (congestive) and diastolic (congestive) heart failure: Secondary | ICD-10-CM

## 2021-12-10 DIAGNOSIS — R627 Adult failure to thrive: Secondary | ICD-10-CM

## 2021-12-10 DIAGNOSIS — Z8616 Personal history of COVID-19: Secondary | ICD-10-CM

## 2021-12-10 LAB — CULTURE, RESPIRATORY W GRAM STAIN: Culture: NORMAL

## 2021-12-10 LAB — BASIC METABOLIC PANEL
Anion gap: 9 (ref 5–15)
BUN: 52 mg/dL — ABNORMAL HIGH (ref 8–23)
CO2: 22 mmol/L (ref 22–32)
Calcium: 9.3 mg/dL (ref 8.9–10.3)
Chloride: 103 mmol/L (ref 98–111)
Creatinine, Ser: 1.39 mg/dL — ABNORMAL HIGH (ref 0.44–1.00)
GFR, Estimated: 36 mL/min — ABNORMAL LOW (ref >60)
Glucose, Bld: 98 mg/dL (ref 70–99)
Potassium: 5 mmol/L (ref 3.5–5.1)
Sodium: 134 mmol/L — ABNORMAL LOW (ref 135–145)

## 2021-12-10 LAB — CBC
HCT: 34.7 % — ABNORMAL LOW (ref 36.0–46.0)
Hemoglobin: 11.2 g/dL — ABNORMAL LOW (ref 12.0–15.0)
MCH: 29.9 pg (ref 26.0–34.0)
MCHC: 32.3 g/dL (ref 30.0–36.0)
MCV: 92.5 fL (ref 80.0–100.0)
Platelets: 182 10*3/uL (ref 150–400)
RBC: 3.75 MIL/uL — ABNORMAL LOW (ref 3.87–5.11)
RDW: 16 % — ABNORMAL HIGH (ref 11.5–15.5)
WBC: 5.4 10*3/uL (ref 4.0–10.5)
nRBC: 0 % (ref 0.0–0.2)

## 2021-12-10 MED ORDER — SODIUM CHLORIDE 0.9 % IV SOLN: INTRAVENOUS | Status: AC

## 2021-12-10 MED ORDER — SODIUM CHLORIDE 0.9 % IV BOLUS
500.0000 mL | Freq: Once | INTRAVENOUS | Status: AC
  Administered 2021-12-10: 500 mL via INTRAVENOUS

## 2021-12-10 MED ORDER — MIDODRINE HCL 5 MG PO TABS
10.0000 mg | ORAL_TABLET | Freq: Two times a day (BID) | ORAL | Status: DC
  Administered 2021-12-11 (×2): 10 mg via ORAL
  Filled 2021-12-10 (×2): qty 2

## 2021-12-10 MED ORDER — GABAPENTIN 300 MG PO CAPS: 300.0000 mg | ORAL_CAPSULE | Freq: Every day | ORAL | Status: DC

## 2021-12-10 NOTE — Progress Notes (Signed)
Occupational Therapy Treatment Patient Details Name: Jessica [name] MRN: [id number] DOB: 06-02-1927 Today's Date: 12/10/2021   History of present illness Jessica [name] is a 88 y.o. female presenting 2/3 with c/o progressive SOB and hypoxic on RA. Work up revealed bilateral small pleural effusions and atelectasis. PMH includes: memory loss, CAD s/p PCI, chronic combined diastolic and systolic congestive heart failure, permanent A. fib on Eliquis, SSS s/p PPM, CKD stage IIIb, DM II recently diet controlled, history of breast cancer, GERD, HTN, and HLD.   OT comments  Pt making steady progress towards OT goals this session. Pt continues to present with impaired balance, decreased activity tolerance, and generalized deconditioning . Pt currently requires MAX A for UB ADLs and total A for LB ADLs. MOD - MAX A +2 for ADL transfer with Rw. Pt would continue to benefit from skilled occupational therapy while admitted and after d/c to address the below listed limitations in order to improve overall functional mobility and facilitate independence with BADL participation. DC plan remains appropriate, will follow acutely per POC.      Recommendations for follow up therapy are one component of a multi-disciplinary discharge planning process, led by the attending physician.  Recommendations may be updated based on patient status, additional functional criteria and insurance authorization.    Follow Up Recommendations  Other (comment) (CNA services to assist with bathing and dressing at home, and to decrease caregiver burden of care)    Assistance Recommended at Discharge Frequent or constant Supervision/Assistance  Patient can return home with the following  Direct supervision/assist for medications management;Direct supervision/assist for financial management;Assistance with cooking/housework;Assistance with feeding;A lot of help with bathing/dressing/bathroom;Two people to help with walking and/or transfers    Equipment Recommendations  Other (comment) (hoyer lift)    Recommendations for Other Services      Precautions / Restrictions Precautions Precautions: Fall Precaution Comments: on 2L O2 during session Restrictions Weight Bearing Restrictions: No       Mobility Bed Mobility Overal bed mobility: Needs Assistance Bed Mobility: Supine to Sit     Supine to sit: Max assist, +2 for physical assistance, HOB elevated     General bed mobility comments: pt intiating maneuvering BLEs to EOB but needed MAX A +2 to scoot hips forward with use of bed pad and elevate trunk into sitting    Transfers Overall transfer level: Needs assistance Equipment used: Rolling walker (2 wheels) Transfers: Sit to/from Stand, Bed to chair/wheelchair/BSC Sit to Stand: Max assist, +2 physical assistance Stand pivot transfers: Max assist, +2 physical assistance         General transfer comment: pt sit>stand from EOB with MAX A+2, once pt in standing pt initiating steps to recliner with rw and MOD A +2 for rw mgmt and balance. pts daughter reports at home they would stand pivot to w/c     Balance Overall balance assessment: Needs assistance Sitting-balance support: Feet supported, Bilateral upper extremity supported Sitting balance-Leahy Scale: Poor Sitting balance - Comments: R lateral leaning with heavy kyphosis, pt able to maintain upright posture with min guard assist for safety Postural control: Right lateral lean, Posterior lean Standing balance support: Bilateral upper extremity supported Standing balance-Leahy Scale: Poor Standing balance comment: reliant on external support and BUE support on RW                           ADL either performed or assessed with clinical judgement   ADL Overall ADL's :  Needs assistance/impaired;At baseline                 Upper Body Dressing : Maximal assistance;Sitting Upper Body Dressing Details (indicate cue type and reason): to don new  gown from EOB Lower Body Dressing: Bed level;Total assistance Lower Body Dressing Details (indicate cue type and reason): to don new socks from bed level Toilet Transfer: Maximal assistance;+2 for physical assistance;Rolling walker (2 wheels);Moderate assistance Toilet Transfer Details (indicate cue type and reason): stand pivot from EOB>Recliner with Rw, MAX A +2 to stand MOD A +2 once in standing to take steps         Functional mobility during ADLs: Maximal assistance;+2 for physical assistance;+2 for safety/equipment;Rolling walker (2 wheels) General ADL Comments: pt continues to present with decreased activity tolerance, impaired balance, and  generalized deconditioning    Extremity/Trunk Assessment Upper Extremity Assessment Upper Extremity Assessment: Generalized weakness   Lower Extremity Assessment Lower Extremity Assessment: Defer to PT evaluation   Cervical / Trunk Assessment Cervical / Trunk Assessment: Kyphotic    Vision Ability to See in Adequate Light: 0 Adequate Patient Visual Report: No change from baseline Vision Assessment?: No apparent visual deficits   Perception Perception Perception: Within Functional Limits   Praxis Praxis Praxis: Intact    Cognition Arousal/Alertness: Awake/alert Behavior During Therapy: Flat affect Overall Cognitive Status: History of cognitive impairments - at baseline                                 General Comments: pt HOH but following all commands, pts daughter present during session motivating pt        Exercises      Shoulder Instructions       General Comments pt on 2L [place] with spo2 WFL, pts daughter present during session reporting that they have all needed DME at home    Pertinent Vitals/ Pain       Pain Assessment Pain Assessment: Faces Faces Pain Scale: No hurt  Home Living                                          Prior Functioning/Environment               Frequency  Min 1X/week        Progress Toward Goals  OT Goals(current goals can now be found in the care plan section)  Progress towards OT goals: Progressing toward goals  Acute Rehab OT Goals Time For Goal Achievement: 12/22/21 Potential to Achieve Goals: [place] Discharge plan remains appropriate;Frequency remains appropriate    Co-evaluation                 AM-PAC OT "6 Clicks" Daily Activity     Outcome Measure   Help from another person eating meals?: A Little Help from another person taking care of personal grooming?: A Little Help from another person toileting, which includes using toliet, bedpan, or urinal?: A Lot Help from another person bathing (including washing, rinsing, drying)?: A Lot Help from another person to put on and taking off regular upper body clothing?: A Lot Help from another person to put on and taking off regular lower body clothing?: A Lot 6 Click Score: 14    End of Session Equipment Utilized During Treatment: Gait belt;Rolling walker (2 wheels)  OT Visit  Diagnosis: Unsteadiness on feet (R26.81);Muscle weakness (generalized) (M62.81);Other abnormalities of gait and mobility (R26.89)   Activity Tolerance Patient tolerated treatment well   Patient Left in chair;with call bell/phone within reach;with chair alarm set;with family/visitor present   Nurse Communication Mobility status;Other (comment) (sit up on for only 1 hour, +2 stedy back to bed)        Time: 0938-1829 OT Time Calculation (min): 20 min  Charges: OT General Charges $OT Visit: 1 Visit OT Treatments $Self Care/Home Management : 8-22 mins  Harley Alto., COTA/L Acute Rehabilitation Services [phone]   Precious Haws 12/10/2021, 10:32 AM

## 2021-12-10 NOTE — Care Management Important Message (Signed)
Important Message  Patient Details  Name: Jessica [name] MRN: [id number] Date of Birth: October 16, 1927   Medicare Important Message Given:  Yes     Shelda Altes 12/10/2021, 9:21 AM

## 2021-12-10 NOTE — Consult Note (Signed)
° °                                                                            °Consultation Note °Date: 12/10/2021  ° °Patient Name: Jessica [name]  °DOB: 01/24/1927  MRN: [id number]  Age / Sex: 88 y.o., female  °PCP: Burns, Stacy J, MD °Referring Physician: Joseph, Preetha, MD ° °Reason for Consultation: Goals of care ° °HPI/Patient Profile: 88 y.o. 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 Chest xray with small pleural effusions. Initially improved with diuresis, however, subsequently developed cough, fever and chest xray showing  L lower lobe infiltrate, and started on antibiotics.  Palliative medicine consulted for GOC due to patient's recent overall decline in status. ° °Primary Decision Maker °HCPOA- daughter- Teresa Ireland ° °Discussion: °Chart reviewed including labs and imaging. Eveny is sitting up awake. Complains of pain in her buttocks. She is grimacing and appears miserable. Audible wet cough. °Met with daughter Teresa at bedside.  °Teresa shares that Kennidi has been in overall decline since November. She currently is bedbound and requires assistance with all ADL's. Her appetite has been decreased. Her cognitive status has also been in decline with intermittent confusion.  °Teresa shares that she feels Kalaya is at end of life.  °Her hopes are to get her mom home and keep her comfortable. Teresa is Denika's only child, but she has been taking care of her full time.  °We discussed the difference in ongoing aggressive medical interventions, antibiotics, rehospitalization vs transition to full comfort measures only. We discussed that comfort measures would include medication for shortness of breath, pain,  anxiety. Other measures not contributing to comfort such as labs, IV fluids, IV antibiotics would be discontinued. Patient would be supported through dying process with comfort.   °Hospice services and philosophy were discussed ° ° °SUMMARY OF RECOMMENDATIONS °-Failure to thrive in the setting of heart failure, recent Covid infection, multiple comorbidities °-Teresa is not yet ready to shift to full comfort, however, ultimately the end goal is to get her Mom home with Hospice  °-Plan to continue current interventions in efforts to best medically maximize Patsye and then d/c home with hospice- however, if Lillyauna were to decompensate or not progress- would transition to comfort in hospital  ° °Code Status/Advance Care Planning: °DNR ° ° °Prognosis:   °Unable to determine - if transitioned to comfort at this point would anticipate less than 2 weeks, however, if she stabilizes than likely months ° °Discharge Planning: To Be Determined ° °Primary Diagnoses: °Present on Admission: ° Elevated troponin ° Acute respiratory failure with hypoxia secondary to heart failure with reduced EF ° SSS (sick sinus syndrome) (HCC) ° Pacemaker-[employer] ° Type 2 diabetes mellitus with diabetic neuropathy, unspecified (HCC) ° Hypertensive urgency ° Atrial fibrillation (HCC) ° CKD (chronic kidney disease) stage 3, GFR 30-59 ml/min (HCC) ° Rib fractures ° Hypercalcemia ° Memory loss ° DNR (do not resuscitate) ° (Resolved) Acute   on chronic systolic CHF (congestive heart failure) (HCC) ° Acute on chronic combined systolic and diastolic CHF (congestive heart failure) (HCC) ° ° °Review of Systems  °Unable to perform ROS: Acuity of condition  ° °Physical Exam °Vitals and nursing note reviewed.  °Pulmonary:  °   Comments: Wet cough, rate and effort increased °Neurological:  °   Mental Status: She is alert.  °   Comments: HOH  ° ° °Vital Signs: BP (!) 104/55    Pulse 74    Temp 98.3 °F (36.8 °C) (Oral)    Resp 20    Ht 5' 4" (1.626 m)    Wt 63.6 kg     LMP  (LMP Unknown)    SpO2 96%    BMI 24.07 kg/m²  °Pain Scale: 0-10 °  °Pain Score: 0-No pain ° ° °SpO2: SpO2: 96 % °O2 Device:SpO2: 96 % °O2 Flow Rate: .O2 Flow Rate (L/min): 3 L/min ° °IO: Intake/output summary:  °Intake/Output Summary (Last 24 hours) at 12/10/2021 1306 °Last data filed at 12/10/2021 0846 °Gross per 24 hour  °Intake 433 ml  °Output 550 ml  °Net -117 ml  ° ° °LBM: Last BM Date: 12/06/21 °Baseline Weight: Weight: 65.9 kg °Most recent weight: Weight: 63.6 kg     °Palliative Assessment/Data: PPS: 20% ° ° ° ° ° ° °Thank you for this consult. Palliative medicine will continue to follow and assist as needed.  °Time Total: 120 minutes ° °Greater than 50%  of this time was spent counseling and coordinating care related to the above assessment and plan. ° °Signed by: °Kasie Mahan, AGNP-C °Palliative Medicine ° °  °Please contact Palliative Medicine Team phone at [phone] for questions and concerns.  °For individual provider: See Amion ° ° ° ° ° ° ° ° ° ° ° ° ° ° °

## 2021-12-10 NOTE — Progress Notes (Signed)
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 fib on Eliquis, SSS s/p PPM, CKD stage IIIb, type 2 diabetes mellitus recent diet controlled, history of breast cancer, GERD, essential hypertension, hyperlipidemia who presents with complaints of shortness of breath over the last week.  He reported progressive decline both cognitively and functionally since she developed COVID in August, was taken off Lasix and lisinopril in January due to low blood pressures. In the ED she was tachycardic, blood pressure was 190 x 110, O2 sats were 70% on room air, BNP was 522, chest x-ray noted small pleural effusions, CTA chest noted bronchitis, small pleural effusion and displaced fracture of ribs 6 -10 -Improved with diuretics, hospital course complicated by worsening productive cough, low-grade fever, repeat chest x-ray notable for left lower lobe infiltrate, started antibiotics 2/6   Subjective: -Remains tired, coughing frequently, discussed with daughter at bedside who is concerned about her ongoing decline  Assessment & Plan:  * Acute respiratory failure with hypoxia secondary to heart failure with reduced EF- (present on admission) Acute on chronic systolic CHF -Was taken off diuretics 2 weeks ago  -Admitted with hypoxia, fluid overload last echo in 2018 with EF 30-35%, repeat echo with improvement to 93%, diastolic dysfunction -Diuresed with IV Lasix, she is 2.2 L negative -Creatinine bumped to 1.5, diuretics held briefly and then resumed -Also suspect a component of pneumonia, low-grade temps with ongoing productive cough and diffuse lower lung rhonchi, repeat chest x-ray concerning for pneumonia, started ceftriaxone and azithromycin yesterday, SLP eval completed, no concerns for overt aspiration  CAP (community acquired  pneumonia) - See above, day 2 of ceftriaxone/azithromycin, continue DuoNebs -Add Mucinex -Status post SLP eval, no concerns for dysphagia noted  Elevated troponin- (present on admission) No ACS, likely from demand in the setting of CHF  Hypertensive urgency- (present on admission) On admission blood pressures elevated at 191/114.  -Continue Coreg, BP is soft now, started on low-dose midodrine this admission for low BP  Atrial fibrillation ([place])- (present on admission) -Stable, continue Coreg and apixaban  Type 2 diabetes mellitus with diabetic neuropathy, unspecified ([place])- (present on admission) Diet controlled.  Last hemoglobin A1c was 6.7 in 11/2021. -Continue heart healthy and carb modified diet  CKD (chronic kidney disease) stage 3, GFR 30-59 ml/min (HCC)- (present on admission) Creatinine stable -Monitor with diuresis  Goals of care, counseling/discussion Daughter reports ongoing functional and cognitive decline in the last 6 months -She is DNR, will consult palliative care for goals of care, discussed consideration of hospice in the future  Memory loss- (present on admission) - Daughter reported progressive decline in her memory and cognitive abilities   Hypercalcemia- (present on admission) Calcium 10.6. -Continue to monitor calcium levels with IV diuresis.  Rib fractures- (present on admission) As noted on CT angiogram of the chest.  Suspect this is secondary to previous fall in 09/2021.  SSS (sick sinus syndrome) ([place])- (present on admission) S/p PM   DVT prophylaxis: Eliquis Code Status: DNR Family Communication: Discussed with daughter Jessica [name] Disposition Plan: Home in 48 hours if stable   Procedures:   Antimicrobials:    Objective: Vitals:   12/10/21 0410 12/10/21 0500 12/10/21 0832 12/10/21 0836  BP: (!) 112/57   117/66  Pulse: 65   76  Resp:      Temp: 98.3 F (36.8 C)  TempSrc: Oral     SpO2: 91% 93% 94% 96%  Weight: 63.6 kg     Height:         Intake/Output Summary (Last 24 hours) at 12/10/2021 1150 Last data filed at 12/10/2021 0846 Gross per 24 hour  Intake 613 ml  Output 550 ml  Net 63 ml   Filed Weights   12/08/21 0006 12/09/21 0430 12/10/21 0410  Weight: 65.2 kg 64.1 kg 63.6 kg    Examination:  General exam: Pleasant chronically ill female sitting up in bed, awake alert, oriented to self and partly to place, moderate cognitive deficits CVS: S1-S2, regular rate rhythm Lungs: Scattered rhonchi both bases Abdomen: Soft, obese, nontender, bowel sounds present Extremities: No edema Skin: No rashes on exposed skin Psychiatry: Flat affect    Data Reviewed:   CBC: Recent Labs  Lab 12/06/21 1110 12/09/21 0205 12/10/21 0239  WBC 8.3 6.6 5.4  NEUTROABS 6.5  --   --   HGB 12.2 12.1 11.2*  HCT 37.6 36.4 34.7*  MCV 93.8 92.6 92.5  PLT 274 206 616   Basic Metabolic Panel: Recent Labs  Lab 12/06/21 1110 12/07/21 0339 12/08/21 0320 12/09/21 0205 12/10/21 0239  NA 141 139 138 133* 134*  K 4.7 4.2 4.5 4.7 5.0  CL 114* 107 104 103 103  CO2 23 22 24 22 22   GLUCOSE 126* 143* 124* 128* 98  BUN 21 21 33* 43* 52*  CREATININE 1.06* 1.11* 1.50* 1.28* 1.39*  CALCIUM 10.6* 10.3 9.9 9.4 9.3   GFR: Estimated Creatinine Clearance: 22.3 mL/min (A) (by C-G formula based on SCr of 1.39 mg/dL (H)). Liver Function Tests: Recent Labs  Lab 12/06/21 1110  AST 25  ALT 22  ALKPHOS 112  BILITOT 1.0  PROT 6.4*  ALBUMIN 3.2*   No results for input(s): LIPASE, AMYLASE in the last 168 hours. No results for input(s): AMMONIA in the last 168 hours. Coagulation Profile: No results for input(s): INR, PROTIME in the last 168 hours. Cardiac Enzymes: No results for input(s): CKTOTAL, CKMB, CKMBINDEX, TROPONINI in the last 168 hours. BNP (last 3 results) Recent Labs    11/25/21 1506  PROBNP 860.0*   HbA1C: No results for input(s): HGBA1C in the last 72 hours. CBG: No results for input(s): GLUCAP in the last 168  hours. Lipid Profile: No results for input(s): CHOL, HDL, LDLCALC, TRIG, CHOLHDL, LDLDIRECT in the last 72 hours. Thyroid Function Tests: No results for input(s): TSH, T4TOTAL, FREET4, T3FREE, THYROIDAB in the last 72 hours. Anemia Panel: No results for input(s): VITAMINB12, FOLATE, FERRITIN, TIBC, IRON, RETICCTPCT in the last 72 hours. Urine analysis:    Component Value Date/Time   COLORURINE YELLOW 09/12/2021 1553   APPEARANCEUR CLEAR 09/12/2021 1553   LABSPEC 1.015 09/12/2021 1553   PHURINE 6.0 09/12/2021 1553   GLUCOSEU NEGATIVE 09/12/2021 1553   HGBUR NEGATIVE 09/12/2021 1553   BILIRUBINUR NEGATIVE 09/12/2021 1553   BILIRUBINUR negative 03/09/2020 1138   KETONESUR NEGATIVE 09/12/2021 1553   PROTEINUR 30 (A) 06/09/2021 2305   UROBILINOGEN 0.2 09/12/2021 1553   NITRITE Positive (A) 09/12/2021 1553   LEUKOCYTESUR Moderate (A) 09/12/2021 1553   Sepsis Labs: @LABRCNTIP (procalcitonin:4,lacticidven:4)  ) Recent Results (from the past 240 hour(s))  Resp Panel by RT-PCR (Flu A&B, Covid) Nasopharyngeal Swab     Status: None   Collection Time: 12/06/21 12:06 PM   Specimen: Nasopharyngeal Swab; Nasopharyngeal(NP) swabs in vial transport medium  Result Value Ref Range Status   SARS Coronavirus 2 by RT PCR NEGATIVE  NEGATIVE Final    Comment: (NOTE) SARS-CoV-2 target nucleic acids are NOT DETECTED.  The SARS-CoV-2 RNA is generally detectable in upper respiratory specimens during the acute phase of infection. The lowest concentration of SARS-CoV-2 viral copies this assay can detect is 138 copies/mL. A negative result does not preclude SARS-Cov-2 infection and should not be used as the sole basis for treatment or other patient management decisions. A negative result may occur with  improper specimen collection/handling, submission of specimen other than nasopharyngeal swab, presence of viral mutation(s) within the areas targeted by this assay, and inadequate number of  viral copies(<138 copies/mL). A negative result must be combined with clinical observations, patient history, and epidemiological information. The expected result is Negative.  Fact Sheet for Patients:  EntrepreneurPulse.com.au  Fact Sheet for Healthcare Providers:  IncredibleEmployment.be  This test is no t yet approved or cleared by the [place] FDA and  has been authorized for detection and/or diagnosis of SARS-CoV-2 by FDA under an Emergency Use Authorization (EUA). This EUA will remain  in effect (meaning this test can be used) for the duration of the COVID-19 declaration under Section 564(b)(1) of the Act, 21 U.S.C.section 360bbb-3(b)(1), unless the authorization is terminated  or revoked sooner.       Influenza A by PCR NEGATIVE NEGATIVE Final   Influenza B by PCR NEGATIVE NEGATIVE Final    Comment: (NOTE) The Xpert Xpress SARS-CoV-2/FLU/RSV plus assay is intended as an aid in the diagnosis of influenza from Nasopharyngeal swab specimens and should not be used as a sole basis for treatment. Nasal washings and aspirates are unacceptable for Xpert Xpress SARS-CoV-2/FLU/RSV testing.  Fact Sheet for Patients: EntrepreneurPulse.com.au  Fact Sheet for Healthcare Providers: IncredibleEmployment.be  This test is not yet approved or cleared by the [place] FDA and has been authorized for detection and/or diagnosis of SARS-CoV-2 by FDA under an Emergency Use Authorization (EUA). This EUA will remain in effect (meaning this test can be used) for the duration of the COVID-19 declaration under Section 564(b)(1) of the Act, 21 U.S.C. section 360bbb-3(b)(1), unless the authorization is terminated or revoked.  Performed at [facility], [place] [address] [place], [place] [address]   MRSA Next Gen by PCR, Nasal     Status: None   Collection Time: 12/06/21  4:37 PM   Specimen: Nasal Mucosa; Nasal  Swab  Result Value Ref Range Status   MRSA by PCR Next Gen NOT DETECTED NOT DETECTED Final    Comment: (NOTE) The GeneXpert MRSA Assay (FDA approved for NASAL specimens only), is one component of a comprehensive MRSA colonization surveillance program. It is not intended to diagnose MRSA infection nor to guide or monitor treatment for MRSA infections. Test performance is not FDA approved in patients less than 86 years old. Performed at [facility], [place] [address], [place], [place] [address]   Expectorated Sputum Assessment w Gram Stain, Rflx to Resp Cult     Status: None   Collection Time: 12/07/21  6:26 PM   Specimen: Expectorated Sputum  Result Value Ref Range Status   Specimen Description EXPECTORATED SPUTUM  Final   Special Requests EXPECTORATED SPUTUM  Final   Sputum evaluation   Final    THIS SPECIMEN IS ACCEPTABLE FOR SPUTUM CULTURE Performed at [facility], [place] [address] [place], [place] [address]    Report Status 12/08/2021 FINAL  Final  Culture, Respiratory w Gram Stain     Status: None   Collection Time: 12/07/21  6:26 PM  Result Value Ref Range Status   Specimen Description EXPECTORATED SPUTUM  Final   Special Requests EXPECTORATED SPUTUM Reflexed from [id number]  Final   Gram Stain   Final    ABUNDANT WBC PRESENT,BOTH PMN AND MONONUCLEAR FEW GRAM POSITIVE COCCI RARE GRAM VARIABLE ROD RARE YEAST    Culture   Final    FEW Normal respiratory flora-no Staph aureus or Pseudomonas seen Performed at [place] [address] [place], [place] [address]    Report Status 12/10/2021 FINAL  Final     Radiology Studies: DG CHEST PORT 1 VIEW  Result Date: 12/09/2021 CLINICAL DATA:  Cough EXAM: PORTABLE CHEST 1 VIEW COMPARISON:  12/06/2021 FINDINGS: Left lower lobe airspace disease. Bilateral upper lobe interstitial thickening which may reflect developing interstitial infection or interstitial edema. No pleural effusion or pneumothorax. Stable  cardiomediastinal silhouette. Dual lead cardiac pacemaker. Thoracic aortic atherosclerosis. No acute osseous abnormality. IMPRESSION: 1. Left lower lobe airspace disease concerning for pneumonia. 2. Bilateral upper lobe interstitial thickening which may reflect developing interstitial infection or interstitial edema. Electronically Signed   By: Kathreen Devoid M.D.   On: 12/09/2021 09:22     Scheduled Meds:  apixaban  5 mg Oral BID   azithromycin  250 mg Oral Daily   bisacodyl  5 mg Oral QHS   carvedilol  12.5 mg Oral BID   furosemide  40 mg Oral Daily   gabapentin  100 mg Oral QHS   gabapentin  300 mg Oral BID   guaiFENesin  600 mg Oral BID   ipratropium-albuterol  3 mL Nebulization TID   midodrine  5 mg Oral BID WC   mirtazapine  3.75 mg Oral QHS   sodium chloride flush  3 mL Intravenous Q12H   Continuous Infusions:  sodium chloride     cefTRIAXone (ROCEPHIN)  IV 1 g (12/10/21 1050)     LOS: 4 days    Time spent:  83min  Domenic Polite, MD [facility]   12/10/2021, 11:50 AM

## 2021-12-10 NOTE — Progress Notes (Signed)
Mobility Specialist Progress Note:   12/10/21 1100  Mobility  Activity Transferred from chair to bed  Level of Assistance Maximum assist, patient does 25-49% (+2)  Assistive Device Front wheel walker  Distance Ambulated (ft) 3 ft  Activity Response Tolerated fair  $Mobility charge 1 Mobility   RN requesting assistance to transfer pt back to bed. Pt requiring mod-maxA throughout transfer. Pt back in bed with all needs met.   Nelta Numbers Mobility Specialist  Phone [phone]

## 2021-12-10 NOTE — Assessment & Plan Note (Addendum)
Daughter reports ongoing functional and cognitive decline in the last 6 months -She is DNR, will consult palliative care for goals of care, discussed consideration of hospice in the future

## 2021-12-11 DIAGNOSIS — I10 Essential (primary) hypertension: Secondary | ICD-10-CM

## 2021-12-11 DIAGNOSIS — J189 Pneumonia, unspecified organism: Secondary | ICD-10-CM

## 2021-12-11 LAB — BASIC METABOLIC PANEL
Anion gap: 9 (ref 5–15)
BUN: 48 mg/dL — ABNORMAL HIGH (ref 8–23)
CO2: 25 mmol/L (ref 22–32)
Calcium: 9.1 mg/dL (ref 8.9–10.3)
Chloride: 103 mmol/L (ref 98–111)
Creatinine, Ser: 1.19 mg/dL — ABNORMAL HIGH (ref 0.44–1.00)
GFR, Estimated: 43 mL/min — ABNORMAL LOW (ref >60)
Glucose, Bld: 93 mg/dL (ref 70–99)
Potassium: 4.3 mmol/L (ref 3.5–5.1)
Sodium: 137 mmol/L (ref 135–145)

## 2021-12-11 LAB — CBC
HCT: 34.1 % — ABNORMAL LOW (ref 36.0–46.0)
Hemoglobin: 11.2 g/dL — ABNORMAL LOW (ref 12.0–15.0)
MCH: 30.6 pg (ref 26.0–34.0)
MCHC: 32.8 g/dL (ref 30.0–36.0)
MCV: 93.2 fL (ref 80.0–100.0)
Platelets: 177 10*3/uL (ref 150–400)
RBC: 3.66 MIL/uL — ABNORMAL LOW (ref 3.87–5.11)
RDW: 15.9 % — ABNORMAL HIGH (ref 11.5–15.5)
WBC: 5 10*3/uL (ref 4.0–10.5)
nRBC: 0 % (ref 0.0–0.2)

## 2021-12-11 NOTE — Progress Notes (Signed)
Physical Therapy Treatment Patient Details Name: Jessica [name] MRN: [id number] DOB: [birth year] Today's Date: 12/11/2021   History of Present Illness Jessica [name] is a 88 y.o. female presenting 2/3 with c/o progressive SOB and hypoxic on RA. Work up revealed bilateral small pleural effusions and atelectasis. PMH includes: memory loss, CAD s/p PCI, chronic combined diastolic and systolic congestive heart failure, permanent A. fib on Eliquis, SSS s/p PPM, CKD stage IIIb, DM II recently diet controlled, history of breast cancer, GERD, HTN, and HLD.    PT Comments    Pt received in supine, A&O x0 and agreeable to therapy session with encouragement. Pt able to transfer to EOB with up to +2 maxA and needing assist for seated balance at trunk. Pt able to stand at bedside with +2 max assist x2 trials, pt unable to weight shift in stance and refusing transfer to chair due to fear of falls/confusion. Pt performed AAROM LE exercises in supine/seated postures. Pt with area of redness and pain on L heel and RN notified, heels floated for comfort. Pt continues to benefit from PT services to progress toward functional mobility goals.   Recommendations for follow up therapy are one component of a multi-disciplinary discharge planning process, led by the attending physician.  Recommendations may be updated based on patient status, additional functional criteria and insurance authorization.  Follow Up Recommendations  Home health PT (encourage palliative consult)     Assistance Recommended at Discharge Frequent or constant Supervision/Assistance  Patient can return home with the following Two people to help with walking and/or transfers;A lot of help with bathing/dressing/bathroom;Assistance with cooking/housework;Assistance with feeding;Direct supervision/assist for medications management;Direct supervision/assist for financial management;Assist for transportation;Help with stairs or ramp for entrance    Equipment Recommendations  Hospital bed;Other (comment) (mechanical lift)    Recommendations for Other Services       Precautions / Restrictions Precautions Precautions: Fall Precaution Comments: on 2L O2 during session Restrictions Weight Bearing Restrictions: No     Mobility  Bed Mobility Overal bed mobility: Needs Assistance Bed Mobility: Supine to Sit Rolling: Min assist, +2 for safety/equipment Sidelying to sit: Max assist, +2 for safety/equipment, HOB elevated   Sit to supine: Total assist, +2 for physical assistance   General bed mobility comments: pt intiating maneuvering BLEs to EOB but needed MAX A +2 to scoot hips forward with use of bed pad and elevate trunk into sitting. Pt able to prop up partially with LUE when sitting up on R EOB    Transfers Overall transfer level: Needs assistance Equipment used: 2 person hand held assist Transfers: Sit to/from Stand, Bed to chair/wheelchair/BSC Sit to Stand: Max assist, +2 physical assistance, From elevated surface           General transfer comment: pt sit>stand from EOB with MAX A+2 via face to face technique with bilateral feet/knees blocked. Once standing, pt with posterior bias and unable to weight shift for lateral steps, stood ~30 sec. Pt c/o fear of falls. On second attempt, pt standing ~20 seconds then refuses further transfers or pivot to chair and c/o fear of falls.    Ambulation/Gait             Pre-gait activities: pt unable to weight shift in stance, heavy posterior lean     Stairs             Wheelchair Mobility    Modified Rankin (Stroke Patients Only)       Balance Overall balance assessment: Needs assistance Sitting-balance  support: Feet supported, Bilateral upper extremity supported Sitting balance-Leahy Scale: Poor Sitting balance - Comments: R lateral leaning with heavy kyphosis and needs mod/maxA for upright posture; after initial reaching activity at EOB pt with improved  balance needing min guard for safety Postural control: Right lateral lean, Posterior lean Standing balance support: Bilateral upper extremity supported Standing balance-Leahy Scale: Zero Standing balance comment: +2 maxA via bilateral HHA and posterior lean throughout.                            Cognition Arousal/Alertness: Awake/alert Behavior During Therapy: Flat affect, Restless, Impulsive Overall Cognitive Status: History of cognitive impairments - at baseline                                 General Comments: pt confused and impulsive, removing lines/covers and A&O x0. Pt with poor command following today, restless and tangential, not seeming to understand therapist questions, difficult to re-orient to task. She follows multimodal cues at times.        Exercises General Exercises - Upper Extremity Shoulder Flexion: AROM, Both, 5 reps, Seated General Exercises - Lower Extremity Long Arc Quad: AROM, AAROM, Both, 10 reps, Seated Other Exercises Other Exercises: seated BLE AAROM: hip flexion x10 reps ea Other Exercises: lateral leans with elbow taps x 3 reps Other Exercises: STS x 2 trials for BLE strengthening, emphasis on upright posture    General Comments General comments (skin integrity, edema, etc.): SpO2 WFL on 2L O2 [place], pt HR 70's bpm. Pt anxious and frequently pulling off lines/leads.      Pertinent Vitals/Pain Pain Assessment Pain Assessment: Faces Faces Pain Scale: Hurts little more Pain Location: L heel (noted to be red, RN notified) Pain Descriptors / Indicators: Grimacing Pain Intervention(s): Limited activity within patient's tolerance, Monitored during session, Repositioned, Other (comment) (heels floated, RN notified she may need foam dressing over heel)     PT Goals (current goals can now be found in the care plan section) Acute Rehab PT Goals Patient Stated Goal: to go home PT Goal Formulation: With patient Time For Goal  Achievement: 12/21/21 Progress towards PT goals: Progressing toward goals    Frequency    Min 3X/week      PT Plan Current plan remains appropriate       AM-PAC PT "6 Clicks" Mobility   Outcome Measure  Help needed turning from your back to your side while in a flat bed without using bedrails?: A Lot Help needed moving from lying on your back to sitting on the side of a flat bed without using bedrails?: Total Help needed moving to and from a bed to a chair (including a wheelchair)?: Total Help needed standing up from a chair using your arms (e.g., wheelchair or bedside chair)?: Total Help needed to walk in hospital room?: Total Help needed climbing 3-5 steps with a railing? : Total 6 Click Score: 7    End of Session Equipment Utilized During Treatment: Gait belt;Oxygen Activity Tolerance: Patient limited by fatigue;Other (comment) (pt cognitive deficit and expressed fear of falls limiting mobility progress) Patient left: in bed;with call bell/phone within reach;with bed alarm set;Other (comment) (initially with bed in chair position due to increased secretions and BUE elevated for comfort but pt too restless and unsafe, trying to get OOB so leaned back further for safety per RN request; heels floated) Nurse Communication: Mobility status;Need for  lift equipment;Other (comment) (L heel pain/redness) PT Visit Diagnosis: Other abnormalities of gait and mobility (R26.89);Unsteadiness on feet (R26.81);Muscle weakness (generalized) (M62.81)     Time: 1901-2224 PT Time Calculation (min) (ACUTE ONLY): 25 min  Charges:  $Therapeutic Exercise: 8-22 mins $Therapeutic Activity: 8-22 mins                     Jessica [name] P., PTA Acute Rehabilitation Services Pager: [phone] Office: [place] 12/11/2021, 5:12 PM

## 2021-12-11 NOTE — Hospital Course (Addendum)
Mrs. Kovarik was admitted to the hospital with the working diagnosis of acute decompensated heart failure.  Complicated with left lower lobe pneumonia not present on admission.   88 yo female with the past medical history of CAD, heart failure, atrial fibrillation, CKD stage 3b, T2DM, breast cancer, HTN and dyslipidemia who presented with dyspnea. Patient had COVID 19 on 06/2021 since then she had a rapid decline in her health, progressive weakness, falls and decrease cognitive function. 01/20 her primary care provider discontinued furosemide and lisinopril due to hypotension. Her family noted her to be more dyspneic for the last 2 days, she was evaluated by her primary care and referred to the hospital for further evaluation. On her initial physical examination her blood pressure was 191/114, HR 110, oxygen saturation 70% on room air, RR 23. Lungs with decrease air movement, heart with S1 and S2 irregularly irregular, abdomen protuberant and no lower extremity edema.   Na 141, K 4,7, CL 114, bicarb 23, glucose 126, bun 21 and cr 1,0  Bnp 522 Wbc 8,3, hgb 12,2 and hct 37,6 plt 274  Sars covid 19 negative   Chest radiograph with hilar vascular congestion, no infiltrates. Ct chest with no pulmonary embolism, bilateral ground glass opacities, small bilateral pleural effusions. Left lateral rib fractures.   EKG 108 bpm, normal axis, normal qtc, atrial fibrillation rhythm, ST depression on led II, III, AVF, V4 to V6 with no significant T wave changes.   Patient was placed on furosemide with improvement in her symptoms.   Follow up chest film with worsening infiltrate at the left lower lobe and started on antibiotic therapy (02/06).  Patient continue to be very weak and deconditioned, palliative care services were consulted.   Patient has been stable, and plan to continue care at home under hospice services.

## 2021-12-11 NOTE — Assessment & Plan Note (Addendum)
Continue local care  Stage 1 pressure ulcer at the sacrum present on admission.

## 2021-12-11 NOTE — Assessment & Plan Note (Signed)
CODE status changed to DNR, patient will be discharged with hospice services.

## 2021-12-11 NOTE — Plan of Care (Signed)

## 2021-12-11 NOTE — TOC Progression Note (Signed)
Transition of Care [facility]) - Progression Note    Patient Details  Name: Jessica [name] MRN: [id number] Date of Birth: June 08, 1927  [facility]) CM/SW Contact  Zenon Mayo, RN Phone Number: 12/11/2021, 5:25 PM  Clinical Narrative:    NCM received consult for home with hospice, NCM called patient's daughter, [name], no answer, left vm for return call.   Expected Discharge Plan: [place] Barriers to Discharge: Continued Medical Work up  Expected Discharge Plan and Services Expected Discharge Plan: [place] In-house Referral: NA Discharge Planning Services: CM Consult Post Acute Care Choice: Home Health, Resumption of Svcs/PTA Provider Living arrangements for the past 2 months: Single Family Home                   DME Agency: NA       HH Arranged: PT, OT HH Agency:  ([place]) Date [place]: 12/08/21 Time [place]: 1530 Representative spoke with at [place]: [place] ([place]) Interventions    Readmission Risk Interventions No flowsheet data found.

## 2021-12-11 NOTE — Progress Notes (Signed)
Daily Progress Note   Patient Name: Jessica [name]       Date: 12/11/2021 DOB: 1927/03/02  Age: 88 y.o. MRN#: [id number] Attending Physician: Tawni Millers Primary Care Physician: Binnie Rail, MD Admit Date: 12/06/2021  Reason for Consultation/Follow-up: Establishing goals of care  Patient Profile/HPI: 88 y.o. 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 Chest xray with small pleural effusions. Initially improved with diuresis, however, subsequently developed cough, fever and chest xray showing  L lower lobe infiltrate, and started on antibiotics.  Palliative medicine consulted for [place] due to patient's recent overall decline in status.  Subjective: Chart reviewed including progress notes, labs, and imaging.  [name] is sitting up in bed, awake, alert. Reports no pain, but having some shortness of breath. No family at bedside.  [name] for followup.  [name] reports that while her Mom has been more awake, she continues not to eat or drink, she is also confused and saying that she's in a motel and she needs to get out. Discussed [name]'s lab results from today. Answered [name]'s questions related to Hospice services and equipment. [name] continues to endorse plan for stabilizing as best possible then discharging home with Hospice.   Review of Systems  Respiratory:  Positive for shortness of breath.     Physical Exam Vitals and nursing note reviewed.  Constitutional:       Appearance: She is ill-appearing.     Comments: Frail, cachectic  Neurological:     Mental Status: She is alert.     Comments: Hard of hearing            Vital Signs: BP 134/71 (BP Location: Left Arm)    Pulse 74    Temp (!) 97.5 F (36.4 C) (Oral)    Resp 14    Ht 5\' 4"  (1.626 m)    Wt 66.3 kg    LMP  (LMP Unknown)    SpO2 96%    BMI 25.09 kg/m  SpO2: SpO2: 96 % O2 Device: O2 Device: Nasal Cannula O2 Flow  Rate: O2 Flow Rate (L/min): 2 L/min  Intake/output summary:  Intake/Output Summary (Last 24 hours) at 12/11/2021 1459 Last data filed at 12/11/2021 1329 Gross per 24 hour  Intake 1737.05 ml  Output 750 ml  Net 987.05 ml   LBM: Last BM Date: 12/06/21 Baseline Weight: Weight: 65.9 kg Most recent weight: Weight: 66.3 kg  CBC    Component Value Date/Time   WBC 5.0 12/11/2021 0302   RBC 3.66 (L) 12/11/2021 0302   HGB 11.2 (L) 12/11/2021 0302   HGB 13.2 09/03/2020 1637   HCT 34.1 (L) 12/11/2021 0302   HCT 39.3 09/03/2020 1637   PLT 177 12/11/2021 0302   PLT 223 09/03/2020 1637   MCV 93.2 12/11/2021 0302   MCV 92 09/03/2020 1637   MCH 30.6 12/11/2021 0302   MCHC 32.8 12/11/2021 0302   RDW 15.9 (H) 12/11/2021 0302   RDW 13.4 09/03/2020 1637   LYMPHSABS 1.0 12/06/2021 1110   LYMPHSABS 1.8 12/27/2018 1457   MONOABS 0.5 12/06/2021 1110   EOSABS 0.1 12/06/2021 1110   EOSABS 0.1 12/27/2018 1457   BASOSABS 0.0 12/06/2021 1110   BASOSABS 0.1 12/27/2018 1457   CMP Latest Ref Rng & Units 12/11/2021 12/10/2021 12/09/2021  Glucose 70 - 99 mg/dL 93 98 128(H)  BUN 8 - 23 mg/dL 48(H) 52(H) 43(H)  Creatinine 0.44 - 1.00 mg/dL 1.19(H) 1.39(H) 1.28(H)  Sodium 135 - 145 mmol/L 137 134(L) 133(L)  Potassium 3.5 - 5.1 mmol/L 4.3 5.0 4.7  Chloride 98 - 111 mmol/L 103 103 103  CO2 22 - 32 mmol/L 25 22 22   Calcium 8.9 - 10.3 mg/dL 9.1 9.3 9.4  Total Protein 6.5 - 8.1 g/dL - - -  Total Bilirubin 0.3 - 1.2 mg/dL - - -  Alkaline Phos 38 - 126 U/L - - -  AST 15 - 41 U/L - - -  ALT 0 - 44 U/L - -  -            Palliative Assessment/Data: PPS:  20%      Patient Active Problem List   Diagnosis Date Noted   Goals of care, counseling/discussion 12/10/2021   CAP (community acquired pneumonia) 12/09/2021   Pressure injury of skin 12/07/2021   Elevated troponin 12/06/2021   Rib fractures 12/06/2021   Hypercalcemia 12/06/2021   Memory loss 12/06/2021   DNR (do not resuscitate) 12/06/2021   Chest pain 09/15/2021   Neoplasm of uncertain behavior of skin 08/23/2021   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 08/23/2021   Atrial fibrillation ([place]) 07/09/2021   Bed sore 07/09/2021   Acute hypoxemic respiratory failure due to COVID-19 ([place]) 06/10/2021   Acute respiratory failure with hypoxia secondary to heart failure with reduced EF 06/10/2021   Abdominal bloating 03/09/2020   Chronic constipation 03/09/2020   H/O bilateral mastectomy 10/13/2018   HX: breast cancer 10/13/2018   Cardiac pacemaker in situ    Acute on chronic combined systolic and diastolic CHF (congestive heart failure) ([employer]) 10/12/2017   UTI (urinary tract infection) 03/28/2017   Bilateral leg edema 04/29/2016   CKD (chronic kidney disease) stage 3, GFR 30-59 ml/min (HCC) 06/10/2015   CAD (coronary artery disease) 06/09/2015   Diabetic neuropathy ([place]) 07/06/2013   SSS (sick sinus syndrome) ([place]) 02/10/2013   Hypertensive urgency 07/30/2012   Pacemaker-Medtronic 07/19/2012   Benign hypertensive heart disease with heart failure ([place]) 01/28/2012   Type 2 diabetes mellitus with diabetic neuropathy, unspecified ([place]) 10/22/2010   HYPERCHOLESTEROLEMIA 10/22/2010   DEGENERATIVE JOINT  DISEASE 10/22/2010    Palliative Care Assessment & Plan    Assessment/Recommendations/Plan  Failure to thrive in the setting of heart failure, recent Covid, recovery complicated by pneumonia Continue current care Stabilize and discharge home with hospice   Code Status: DNR  Prognosis:  < 6 months  Discharge  Planning: Home with Hospice  Care plan was discussed with patient's daughter, [name].   Thank you for allowing the Palliative Medicine Team to assist in the care of this patient.  Greater than 50%  of this time was spent counseling and coordinating care related to the above assessment and plan.  Mariana Kaufman, AGNP-C Palliative Medicine   Please contact Palliative Medicine Team phone at [phone] for questions and concerns.

## 2021-12-11 NOTE — Progress Notes (Signed)
Progress Note   Patient: Jessica [name] YYT:[id number] DOB: 01/05/1927 DOA: 12/06/2021     5 DOS: the patient was seen and examined on 12/11/2021   Brief hospital course: Jessica [name] was admitted to the hospital with the working diagnosis of acute decompensated heart failure.   88 yo female with the past medical history of CAD, heart failure, atrial fibrillation, CKD stage 3b, T2DM, breast cancer, HTN and dyslipidemia who presented with dyspnea. Patient had COVID 19 on 06/2021 since then she had a rapid decline in her health, progressive weakness, falls and decrease cognitive function. 01/20 her primary care provider discontinued furosemide and lisinopril due to hypotension. Her family noted her to be more dyspneic for the last 2 days, she was evaluated by her primary care and referred to the hospital for further evaluation. On her initial physical examination her blood pressure was 191/114, HR 110, oxygen saturation 70% on room air, RR 23. Lungs with decrease air movement, heart with S1 and S2 irregularly irregular, abdomen protuberant and no lower extremity edema.   Na 141, K 4,7, CL 114, bicarb 23, glucose 126, bun 21 and cr 1,0  Bnp 522 Wbc 8,3, hgb 12,2 and hct 37,6 plt 274  Sars covid 19 negative   Chest radiograph with hilar vascular congestion, no infiltrates. Ct chest with no pulmonary embolism, bilateral ground glass opacities, small bilateral pleural effusions. Left lateral rib fractures.   EKG 108 bpm, normal axis, normal qtc, atrial fibrillation rhythm, ST depression on led II, III, AVF, V4 to V6 with no significant T wave changes.   Patient was placed on furosemide with improvement in her symptoms.   Follow up chest film with worsening infiltrate at the left lower lobe and started on antibiotic therapy (02/06).   Assessment and Plan: * Acute respiratory failure with hypoxia secondary to heart failure with reduced EF- (present on admission)  EF 30-35%, repeat echo with improvement  to 27%, diastolic dysfunction  Urine output 5,170 ml Systolic blood pressure 017 to 134 mmHg   Clinically with improved volume status, continue with carvedilol, and midodrine.  Holding on furosemide at this point.   CAP (community acquired pneumonia) Patient with rhonchi, very weak and deconditioned. Suspected left lower lobe pneumonia, plan to continue antibiotic therapy with ceftriaxone/azithromycin.  Continue with aspiration precautions.   Atrial fibrillation ([place])- (present on admission) Continue rate control with carvedilol for rate control and continue anticoagulation with apixaban    Essential hypertension- (present on admission) On admission hypertensive urgency now has resolved Continue blood pressure control with carvedilol.   Type 2 diabetes mellitus with diabetic neuropathy, unspecified ([place])- (present on admission) Patient is tolerating po well.  Fasting glucose this am 93,    SSS (sick sinus syndrome) ([place])- (present on admission) Patient has pace maker in place.   Acute kidney injury superimposed on chronic kidney disease ([place])- (present on admission) Hypercalcemia.  Renal function with serum cr at 1,19 with K at 4,3 and serum bicarbonate at 25 Plan to continue close follow up on renal function and electrolytes.  Calcium today is 9,1  Goals of care, counseling/discussion Daughter reports ongoing functional and cognitive decline in the last 6 months -She is DNR, will consult palliative care for goals of care, discussed consideration of hospice in the future  Pressure injury of skin Continue local care   DNR (do not resuscitate)- (present on admission) CODE status changed to DNR, patient will be discharged with hospice services.   Memory loss- (present on admission) Patient with  progressive decline in her memory and cognitive abilities   Hypercalcemia Calcium 10.6. -Continue to monitor calcium levels with IV diuresis.  Rib fractures- (present on  admission) As noted on CT angiogram of the chest.  Suspect this is secondary to previous fall in 09/2021. Patient will be discharged home with hospice services.   Elevated troponin No ACS, likely from demand in the setting of CHF        Subjective: patient with no nausea or vomiting, her dyspnea has been improving, limited history due to cognitive impairment.   Physical Exam: Vitals:   12/11/21 0518 12/11/21 0827 12/11/21 1251 12/11/21 1412  BP: (!) 111/48  134/71   Pulse:   74   Resp: 20  14   Temp: (!) 97.4 F (36.3 C)  (!) 97.5 F (36.4 C)   TempSrc: Oral  Oral   SpO2: 95% 98% 95% 96%  Weight:      Height:       Neurology patient is awake and alert, positive confusion but no agitation  ENT positive pallor  Cardiovascular heart with S1 and S2 present and rhythmic, positive murmur at at apex systolic No JVD  No lower extremity edema Respiratory, patient with bilateral rhonchi and rales but not wheezing  Abdomen soft and non tender  Data Reviewed:  Reviewed   Family Communication: no family at the bedside   Disposition: Status is: Inpatient Remains inpatient appropriate because: management of respiratory failure           Planned Discharge Destination:  home with hospice      Author: Tawni Millers, MD 12/11/2021 4:04 PM  For on call review [email or web address].

## 2021-12-12 ENCOUNTER — Telehealth: Payer: Self-pay | Admitting: Internal Medicine

## 2021-12-12 ENCOUNTER — Other Ambulatory Visit (HOSPITAL_COMMUNITY): Payer: Self-pay

## 2021-12-12 DIAGNOSIS — N179 Acute kidney failure, unspecified: Secondary | ICD-10-CM

## 2021-12-12 DIAGNOSIS — Z7189 Other specified counseling: Secondary | ICD-10-CM

## 2021-12-12 DIAGNOSIS — N189 Chronic kidney disease, unspecified: Secondary | ICD-10-CM

## 2021-12-12 DIAGNOSIS — I5023 Acute on chronic systolic (congestive) heart failure: Secondary | ICD-10-CM

## 2021-12-12 LAB — BASIC METABOLIC PANEL
Anion gap: 9 (ref 5–15)
BUN: 37 mg/dL — ABNORMAL HIGH (ref 8–23)
CO2: 25 mmol/L (ref 22–32)
Calcium: 10.1 mg/dL (ref 8.9–10.3)
Chloride: 106 mmol/L (ref 98–111)
Creatinine, Ser: 1.04 mg/dL — ABNORMAL HIGH (ref 0.44–1.00)
GFR, Estimated: 50 mL/min — ABNORMAL LOW (ref >60)
Glucose, Bld: 131 mg/dL — ABNORMAL HIGH (ref 70–99)
Potassium: 4.5 mmol/L (ref 3.5–5.1)
Sodium: 140 mmol/L (ref 135–145)

## 2021-12-12 LAB — CBC
HCT: 36.5 % (ref 36.0–46.0)
Hemoglobin: 11.8 g/dL — ABNORMAL LOW (ref 12.0–15.0)
MCH: 29.5 pg (ref 26.0–34.0)
MCHC: 32.3 g/dL (ref 30.0–36.0)
MCV: 91.3 fL (ref 80.0–100.0)
Platelets: 210 10*3/uL (ref 150–400)
RBC: 4 MIL/uL (ref 3.87–5.11)
RDW: 15.5 % (ref 11.5–15.5)
WBC: 6.3 10*3/uL (ref 4.0–10.5)
nRBC: 0 % (ref 0.0–0.2)

## 2021-12-12 MED ORDER — FUROSEMIDE 20 MG PO TABS
20.0000 mg | ORAL_TABLET | Freq: Every day | ORAL | 0 refills | Status: AC
  Filled 2021-12-12: qty 30, 30d supply, fill #0

## 2021-12-12 MED ORDER — CARVEDILOL 12.5 MG PO TABS
12.5000 mg | ORAL_TABLET | Freq: Two times a day (BID) | ORAL | 0 refills | Status: AC
  Filled 2021-12-12: qty 60, 30d supply, fill #0

## 2021-12-12 MED ORDER — MIRTAZAPINE 7.5 MG PO TABS: 3.7500 mg | ORAL_TABLET | Freq: Every day | ORAL | 0 refills | Status: AC

## 2021-12-12 MED ORDER — AMOXICILLIN-POT CLAVULANATE 875-125 MG PO TABS: 1.0000 | ORAL_TABLET | Freq: Two times a day (BID) | ORAL | Status: DC

## 2021-12-12 MED ORDER — FUROSEMIDE 20 MG PO TABS: 20.0000 mg | ORAL_TABLET | Freq: Every day | ORAL | Status: DC

## 2021-12-12 MED ORDER — MIDODRINE HCL 10 MG PO TABS
10.0000 mg | ORAL_TABLET | Freq: Two times a day (BID) | ORAL | 0 refills | Status: DC
  Filled 2021-12-12: qty 60, 30d supply, fill #0

## 2021-12-12 MED ORDER — AMOXICILLIN-POT CLAVULANATE 875-125 MG PO TABS
1.0000 | ORAL_TABLET | Freq: Two times a day (BID) | ORAL | 0 refills | Status: AC
  Filled 2021-12-12: qty 6, 3d supply, fill #0

## 2021-12-12 NOTE — Care Management Important Message (Signed)
Important Message  Patient Details  Name: Jessica [name] MRN: [id number] Date of Birth: Apr 15, 1927   Medicare Important Message Given:  Yes     Shelda Altes 12/12/2021, 11:37 AM

## 2021-12-12 NOTE — Progress Notes (Addendum)
[facility]) Hospital Liaison Note   Received request from Transitions of Care Manager, Wendi Maya., for hospice services at home after discharge. Chart and patient information under review by [facility] physician. Hospice eligibility approved.   Spoke with daughter/Teresa  to initiate education related to hospice philosophy, services, and team approach to care. [name]  verbalized understanding of information given. Per discussion, the plan is for patient to discharge home via TBD once cleared to DC.    DME needs discussed. Patient has the following equipment in the home (Through [employer]): [place] [facility] Bed Wheelchair O2 (concentrator & Education officer, museum) Shower Chair  [facility] Patient requests the following equipment for delivery: [employer]  Address verified and is correct in the chart. [name] is the family member to contact to arrange time of equipment delivery.    Please send signed and completed DNR home with patient/family. Please provide prescriptions at discharge as needed to ensure ongoing symptom management.    [facility] information and contact numbers given to family & above information shared with TOC.   Please call with any questions/concerns.    Thank you for the opportunity to participate in this patient's care.   Daphene Calamity, MSW [facility] Liaison  [phone]

## 2021-12-12 NOTE — TOC Initial Note (Signed)
Transition of Care [facility]) - Initial/Assessment Note    Patient Details  Name: Jessica [name] MRN: [id number] Date of Birth: [birth year]  Transition of Care [facility]) CM/SW Contact:    Zenon Mayo, RN Phone Number: 12/12/2021, 10:07 AM  Clinical Narrative:                 NCM received call from [name], the daughter , stating she  would like [facility],  NCM made referral to [facility] with [facility].  [name] states they have a hospital bed, transport chair, wchair, shower chair, oxygen with [employer](2) liters , BSC.  NCM confirmed address that patient will be going to. [name] is not sure if she wants patient to go home by ambulance, she states she wants a letter from the doctor stating that it is medically necessary so the insurance would pay for it.   Expected Discharge Plan: [place] Barriers to Discharge: Continued Medical Work up   Patient Goals and CMS Choice Patient states their goals for this hospitalization and ongoing recovery are:: return home with daughter CMS Medicare.gov Compare Post Acute Care list provided to:: Patient Represenative (must comment) Choice offered to / list presented to : Adult Children  Expected Discharge Plan and Services Expected Discharge Plan: [place] In-house Referral: NA Discharge Planning Services: CM Consult Post Acute Care Choice: Home Health, Resumption of Svcs/PTA Provider Living arrangements for the past 2 months: Single Family Home                   DME Agency: NA       HH Arranged: PT, OT HH Agency:  ([place]) Date [place]: 12/08/21 Time [place]: 1530 Representative spoke with at [place]: [place] Arrangements/Services Living arrangements for the past 2 months: [place] Lives with:: Adult Children Patient language and need for interpreter reviewed:: Yes Do you feel safe going back to the place where you live?: Yes      Need for  Family Participation in Patient Care: Yes (Comment) Care giver support system in place?: Yes (comment) Current home services: DME, Home PT, Home OT (has transfer chair, hosp bed, bsc, rollator, shower stool, oxygen 2 liters ([employer]), w/chair.  Active with [facility] for PT, OT) Criminal Activity/Legal Involvement Pertinent to Current Situation/Hospitalization: No - Comment as needed  Activities of Daily Living Home Assistive Devices/Equipment: None ADL Screening (condition at time of admission) Patient's cognitive ability adequate to safely complete daily activities?: No Is the patient deaf or have difficulty hearing?: No Does the patient have difficulty seeing, even when wearing glasses/contacts?: No Does the patient have difficulty concentrating, remembering, or making decisions?: No Patient able to express need for assistance with ADLs?: Yes Does the patient have difficulty dressing or bathing?: Yes Independently performs ADLs?: No Does the patient have difficulty walking or climbing stairs?: Yes Weakness of Legs: Both Weakness of Arms/Hands: Both  Permission Sought/Granted                  Emotional Assessment         Alcohol / Substance Use: Not Applicable Psych Involvement: No (comment)  Admission diagnosis:  Hypoxia [R09.02] Heart failure with reduced ejection fraction (Home) [I50.20] Patient Active Problem List   Diagnosis Date Noted   Goals of care, counseling/discussion 12/10/2021   CAP (community acquired pneumonia) 12/09/2021   Pressure injury of skin 12/07/2021   Elevated troponin 12/06/2021   Rib fractures 12/06/2021  Hypercalcemia 12/06/2021   Memory loss 12/06/2021   DNR (do not resuscitate) 12/06/2021   Chest pain 09/15/2021   Neoplasm of uncertain behavior of skin 08/23/2021   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 08/23/2021   Atrial fibrillation ([place]) 07/09/2021   Bed sore 07/09/2021   Acute hypoxemic respiratory failure due to COVID-19  ([place]) 06/10/2021   Acute respiratory failure with hypoxia secondary to heart failure with reduced EF 06/10/2021   Abdominal bloating 03/09/2020   Chronic constipation 03/09/2020   H/O bilateral mastectomy 10/13/2018   HX: breast cancer 10/13/2018   Cardiac pacemaker in situ    Acute on chronic combined systolic and diastolic CHF (congestive heart failure) ([place]) 10/12/2017   UTI (urinary tract infection) 03/28/2017   Bilateral leg edema 04/29/2016   Acute kidney injury superimposed on chronic kidney disease ([place]) 06/10/2015   CAD (coronary artery disease) 06/09/2015   Diabetic neuropathy ([place]) 07/06/2013   SSS (sick sinus syndrome) ([place]) 02/10/2013   Essential hypertension 07/30/2012   Pacemaker-Medtronic 07/19/2012   Benign hypertensive heart disease with heart failure ([place]) 01/28/2012   Type 2 diabetes mellitus with diabetic neuropathy, unspecified ([place]) 10/22/2010   HYPERCHOLESTEROLEMIA 10/22/2010   HYPERTENSION 10/22/2010   DEGENERATIVE JOINT DISEASE 10/22/2010   PCP:  Binnie Rail, MD Pharmacy:   [facility] ([place] [place] - [address] [place] ([place])  [id number] Phone: [phone] Fax: [phone]     Social Determinants of Health ([place]) Interventions    Readmission Risk Interventions No flowsheet data found.

## 2021-12-12 NOTE — Telephone Encounter (Signed)
Patient is being discharged from hospital today - Patients family is requesting Dr. Billey Gosling to serve as her hospital attending.  [facility] needs to know if Dr. Quay Burow is willing to do this.  Please advise.

## 2021-12-12 NOTE — Discharge Summary (Signed)
Physician Discharge Summary   Patient: Jessica [name] MRN: [id number] DOB: 1927-01-31  Admit date:     12/06/2021  Discharge date: 12/12/21  Discharge Physician: Jimmy Picket Ajeet Casasola   PCP: Binnie Rail, MD   Recommendations at discharge:    Patient will continue taking Augmentin for 3 more days Resume taking furosemide 20 mg daily Follow renal function in 7 days. Continue carvedilol for heart failure management.   I spoke over the phone with the patient's daughter about patient's  condition, plan of care, prognosis and all questions were addressed.   Discharge Diagnoses: Principal Problem:   Acute respiratory failure with hypoxia secondary to heart failure with reduced EF Active Problems:   CAP (community acquired pneumonia)   Atrial fibrillation (HCC)   Type 2 diabetes mellitus with diabetic neuropathy, unspecified (HCC)   Essential hypertension   SSS (sick sinus syndrome) (HCC)   Acute kidney injury superimposed on chronic kidney disease ([place])   Rib fractures   Memory loss   DNR (do not resuscitate)   Pressure injury of skin   Goals of care, counseling/discussion  Resolved Problems:   Acute on chronic systolic CHF (congestive heart failure) [facility])   Hospital Course: Jessica [name] was admitted to the hospital with the working diagnosis of acute decompensated heart failure.  Complicated with left lower lobe pneumonia not present on admission.   88 yo female with the past medical history of CAD, heart failure, atrial fibrillation, CKD stage 3b, T2DM, breast cancer, HTN and dyslipidemia who presented with dyspnea. Patient had COVID 19 on 06/2021 since then she had a rapid decline in her health, progressive weakness, falls and decrease cognitive function. 01/20 her primary care provider discontinued furosemide and lisinopril due to hypotension. Her family noted her to be more dyspneic for the last 2 days, she was evaluated by her primary care and referred to the hospital for  further evaluation. On her initial physical examination her blood pressure was 191/114, HR 110, oxygen saturation 70% on room air, RR 23. Lungs with decrease air movement, heart with S1 and S2 irregularly irregular, abdomen protuberant and no lower extremity edema.   Na 141, K 4,7, CL 114, bicarb 23, glucose 126, bun 21 and cr 1,0  Bnp 522 Wbc 8,3, hgb 12,2 and hct 37,6 plt 274  Sars covid 19 negative   Chest radiograph with hilar vascular congestion, no infiltrates. Ct chest with no pulmonary embolism, bilateral ground glass opacities, small bilateral pleural effusions. Left lateral rib fractures.   EKG 108 bpm, normal axis, normal qtc, atrial fibrillation rhythm, ST depression on led II, III, AVF, V4 to V6 with no significant T wave changes.   Patient was placed on furosemide with improvement in her symptoms.   Follow up chest film with worsening infiltrate at the left lower lobe and started on antibiotic therapy (02/06).  Patient continue to be very weak and deconditioned, palliative care services were consulted.   Patient has been stable, and plan to continue care at home under hospice services.   Assessment and Plan: * Acute respiratory failure with hypoxia secondary to heart failure with reduced EF- (present on admission) Patient was admitted to the cardiac unit and was placed on aggressive diuresis with furosemide. Negative fluid balance was achieved with improvement of her symptoms.   Further work up with echocardiography showed  EF o f60 to 65% with moderate reduction in RV systolic function. RV systolic pressure 52 mmHg. No significant valvular disease.  Urine output 0,263 ml Systolic  blood pressure 111 to 134 mmHg   Patient will continue heart failure management with carvedilol and furosemide. Discontinue midodrine at discharge.   CAP (community acquired pneumonia) Left lower lobe pneumonia, and placed on antibiotic therapy with ceftriaxone/azithromycin.  Speech was  consulted for swallow evaluation, with recommendations of regular solids and thin liquids with aspiration precautions.   Continue antibiotic therapy with Augmentin.   Atrial fibrillation ([place])- (present on admission) Rate control with carvedilol and anticoagulation with apixaban    Essential hypertension- (present on admission) On admission hypertensive urgency now has resolved On carvedilol for blood pressure control.   Type 2 diabetes mellitus with diabetic neuropathy, unspecified ([place])- (present on admission) Her glucose remained well controlled during her hospitalization.,    SSS (sick sinus syndrome) ([place])- (present on admission) Patient has pace maker in place.   Acute kidney injury superimposed on chronic kidney disease ([place])- (present on admission) Hypercalcemia/ hyponatremia  Patient underwent diuresis with good toleration, peak Cr up to 1,50 With supportive medical therapy her renal function has improved at her discharge is 1,0 with K at 4,5 and serum bicarbonate at 25. Na 140 Ca is 10.1  Goals of care, counseling/discussion Daughter reports ongoing functional and cognitive decline in the last 6 months -She is DNR, will consult palliative care for goals of care, discussed consideration of hospice in the future  Pressure injury of skin Continue local care  Stage 1 pressure ulcer at the sacrum present on admission.   DNR (do not resuscitate)- (present on admission) CODE status changed to DNR, patient will be discharged with hospice services.   Memory loss- (present on admission) Patient with progressive decline in her memory and cognitive abilities   Hypercalcemia Calcium 10.6. -Continue to monitor calcium levels with IV diuresis.  Rib fractures- (present on admission) As noted on CT angiogram of the chest.  Suspect this is secondary to previous fall in 09/2021. Patient will be discharged home with hospice services.   Elevated troponin No ACS, likely from  demand in the setting of CHF           Consultants: palliative care  Procedures performed:  none   Disposition: Home with hospice  Diet recommendation:  Discharge Diet Orders (From admission, onward)     Start     Ordered   12/12/21 0000  Diet - low sodium heart healthy        12/12/21 1304           Cardiac diet  DISCHARGE MEDICATION: Allergies as of 12/12/2021       Reactions   Ciprofloxacin Other (See Comments)   Cipro makes patient extremely mean   Lyrica [pregabalin] Swelling   Caused weight gain   Amlodipine Swelling   Sulfonamide Derivatives Rash        Medication List     STOP taking these medications    potassium chloride SA 20 MEQ tablet Commonly known as: KLOR-CON M       TAKE these medications    acetaminophen 500 MG tablet Commonly known as: TYLENOL Take 500 mg by mouth every 6 (six) hours as needed for moderate pain.   amoxicillin-clavulanate 875-125 MG tablet Commonly known as: AUGMENTIN Take 1 tablet by mouth every 12 (twelve) hours for 3 days.   apixaban 5 MG Tabs tablet Commonly known as: Eliquis Take 1 tablet (5 mg total) by mouth 2 (two) times daily.   b complex vitamins tablet Take 1 tablet by mouth daily.   bisacodyl 5 MG EC tablet  Commonly known as: DULCOLAX Take 5 mg by mouth at bedtime.   carvedilol 12.5 MG tablet Commonly known as: COREG Take 1 tablet (12.5 mg total) by mouth 2 (two) times daily. What changed:  medication strength how much to take   Echinacea 650 MG Caps Take 650 mg by mouth daily.   fish oil-omega-3 fatty acids 1000 MG capsule Take 1 g by mouth every morning.   furosemide 20 MG tablet Commonly known as: LASIX Take 1 tablet (20 mg total) by mouth daily. What changed: additional instructions   gabapentin 100 MG capsule Commonly known as: NEURONTIN Take 100 mg by mouth at bedtime.   gabapentin 300 MG capsule Commonly known as: NEURONTIN TAKE 1 CAPSULE BY MOUTH THREE TIMES DAILY    MILK OF MAGNESIA PO Take 30 mLs by mouth daily as needed (constipation).   mirtazapine 7.5 MG tablet Commonly known as: REMERON Take 0.5 tablets (3.75 mg total) by mouth at bedtime.   nitroGLYCERIN 0.4 MG SL tablet Commonly known as: NITROSTAT Place 1 tablet (0.4 mg total) under the tongue every 5 (five) minutes as needed for chest pain. x3 doses as needed for chest pain What changed: additional instructions        Follow-up Information     [place] [place] Follow up.   Specialty: Home Health Services Why: The new name is [facility] [place] will contact you with apt times. Contact information: [address] [place] [address] [phone]         [facility] Follow up.   Specialty: Hospice and Palliative Medicine Why: home hospice Contact information: [place]        Binnie Rail, MD Follow up in 1 week(s).   Specialty: Internal Medicine Contact information: [place] [address] [phone]                 Discharge Exam: Danley Danker Weights   12/10/21 0410 12/11/21 0042 12/12/21 0640  Weight: 63.6 kg 66.3 kg 66.3 kg   Neurology patient is awake and alert, positive confusion but not agitation  ENT no pallor or icterus Cardiovascular with S1 and S2 present and rhythmic with no gallops or murmurs No JVD No lower extremity edema Respiratory with no wheezing, bilateral rhonchi, no rales Abdomen soft and non tender  Condition at discharge: stable  The results of significant diagnostics from this hospitalization (including imaging, microbiology, ancillary and laboratory) are listed below for reference.   Imaging Studies: CT Angio Chest PE W and/or Wo Contrast  Result Date: 12/06/2021 CLINICAL DATA:  Shortness of breath EXAM: CT ANGIOGRAPHY CHEST WITH CONTRAST TECHNIQUE:  Multidetector CT imaging of the chest was performed using the standard protocol during bolus administration of intravenous contrast. Multiplanar CT image reconstructions and MIPs were obtained to evaluate the vascular anatomy. RADIATION DOSE REDUCTION: This exam was performed according to the departmental dose-optimization program which includes automated exposure control, adjustment of the mA and/or kV according to patient size and/or use of iterative reconstruction technique. CONTRAST:  14mL OMNIPAQUE IOHEXOL 350 MG/ML SOLN COMPARISON:  Chest CT dated 06/10/2021. FINDINGS: Cardiovascular: Some of the most peripheral segmental and subsegmental pulmonary artery branches are difficult to definitively characterize due to patient breathing motion artifact, however, there is no pulmonary embolism identified within the main, lobar or segmental pulmonary arteries bilaterally. Cardiomegaly. No significant pericardial effusion. Three-vessel coronary artery calcifications. No thoracic aortic aneurysm. Diffuse  aortic atherosclerosis. Mediastinum/Nodes: No mass or enlarged lymph nodes are seen within the mediastinum. Esophagus is unremarkable. Trachea and central bronchi are unremarkable. Lungs/Pleura: Bibasilar atelectasis and/or chronic interstitial lung disease. Pronounced thickening of the walls of the bronchi to the bilateral lower lobes. Small bilateral pleural effusions. No evidence of consolidating pneumonia.  No pneumothorax. Upper Abdomen: No acute findings. Musculoskeletal: Displaced fractures of the LEFT lateral sixth through tenth ribs, subacute to chronic based on appearance. Review of the MIP images confirms the above findings. IMPRESSION: 1. No pulmonary embolism seen. 2. Pronounced thickening of the walls of the bronchi to the bilateral lower lobes, suggesting acute or chronic bronchitis. No evidence of consolidating pneumonia. 3. Small bilateral pleural effusions. 4. Bibasilar atelectasis, likely superimposed  on chronic interstitial lung disease. 5. Displaced fractures of the LEFT lateral sixth through tenth ribs, subacute to chronic based on appearance. 6. Cardiomegaly. Three-vessel coronary artery calcifications. Aortic Atherosclerosis (ICD10-I70.0). Electronically Signed   By: Franki Cabot M.D.   On: 12/06/2021 13:29   DG CHEST PORT 1 VIEW  Result Date: 12/09/2021 CLINICAL DATA:  Cough EXAM: PORTABLE CHEST 1 VIEW COMPARISON:  12/06/2021 FINDINGS: Left lower lobe airspace disease. Bilateral upper lobe interstitial thickening which may reflect developing interstitial infection or interstitial edema. No pleural effusion or pneumothorax. Stable cardiomediastinal silhouette. Dual lead cardiac pacemaker. Thoracic aortic atherosclerosis. No acute osseous abnormality. IMPRESSION: 1. Left lower lobe airspace disease concerning for pneumonia. 2. Bilateral upper lobe interstitial thickening which may reflect developing interstitial infection or interstitial edema. Electronically Signed   By: Kathreen Devoid M.D.   On: 12/09/2021 09:22   DG Chest Portable 1 View  Result Date: 12/06/2021 CLINICAL DATA:  r/o PNA EXAM: PORTABLE CHEST 1 VIEW COMPARISON:  September 12, 2021. FINDINGS: Small left pleural effusion. Overlying left basilar opacities. No visible pneumothorax. Biapical pleuroparenchymal scarring. Cardiomediastinal silhouette is similar to prior. Left subclavian approach dual lead cardiac mediastinal iced. Right axillary and/or chest wall clips. IMPRESSION: Small left pleural effusion. Overlying left basilar opacities could represent atelectasis and/or pneumonia. Electronically Signed   By: Margaretha Sheffield M.D.   On: 12/06/2021 11:51   ECHOCARDIOGRAM COMPLETE  Result Date: 12/08/2021    ECHOCARDIOGRAM REPORT   Patient Name:   Aiza MONQUIE FULGHAM Date of Exam: 12/08/2021 Medical Rec #:  [id number]      Height:       64.0 in Accession #:    [id number]     Weight:       143.7 lb Date of Birth:  11/20/1926      BSA:           1.700 m Patient Age:    30 years       BP:           110/56 mmHg Patient Gender: F              HR:           75 bpm. Exam Location:  Inpatient Procedure: 2D Echo Indications:    CHF  History:        Patient has prior history of Echocardiogram examinations, most                 recent 10/15/2017. Arrythmias:Atrial Fibrillation; Risk                 Factors:Diabetes.  Sonographer:    Jefferey Pica Referring Phys: [id number] RONDELL A SMITH IMPRESSIONS  1. Left ventricular ejection fraction, by estimation, is 60 to 65%. The left  ventricle has normal function. The left ventricle has no regional wall motion abnormalities. There is mild left ventricular hypertrophy. Left ventricular diastolic function could not be evaluated.  2. Right ventricular systolic function is moderately reduced. The right ventricular size is mildly enlarged. There is moderately elevated pulmonary artery systolic pressure. The estimated right ventricular systolic pressure is 46.9 mmHg.  3. Left atrial size was mildly dilated.  4. The mitral valve is abnormal. Trivial mitral valve regurgitation.  5. The aortic valve is tricuspid. Aortic valve regurgitation is trivial. Aortic valve sclerosis/calcification is present, without any evidence of aortic stenosis.  6. The inferior vena cava is normal in size with <50% respiratory variability, suggesting right atrial pressure of 8 mmHg. Comparison(s): No prior Echocardiogram. FINDINGS  Left Ventricle: Left ventricular ejection fraction, by estimation, is 60 to 65%. The left ventricle has normal function. The left ventricle has no regional wall motion abnormalities. The left ventricular internal cavity size was normal in size. There is  mild left ventricular hypertrophy. Left ventricular diastolic function could not be evaluated due to atrial fibrillation. Left ventricular diastolic function could not be evaluated. Right Ventricle: The right ventricular size is mildly enlarged. No increase in right  ventricular wall thickness. Right ventricular systolic function is moderately reduced. There is moderately elevated pulmonary artery systolic pressure. The tricuspid regurgitant velocity is 3.47 m/s, and with an assumed right atrial pressure of 8 mmHg, the estimated right ventricular systolic pressure is 62.9 mmHg. Left Atrium: Left atrial size was mildly dilated. Right Atrium: Right atrial size was normal in size. Pericardium: There is no evidence of pericardial effusion. Mitral Valve: The mitral valve is abnormal. There is mild thickening of the mitral valve leaflet(s). Mild mitral annular calcification. Trivial mitral valve regurgitation. Tricuspid Valve: The tricuspid valve is grossly normal. Tricuspid valve regurgitation is mild. Aortic Valve: The aortic valve is tricuspid. Aortic valve regurgitation is trivial. Aortic regurgitation PHT measures 527 msec. Aortic valve sclerosis/calcification is present, without any evidence of aortic stenosis. Aortic valve peak gradient measures 9.7 mmHg. Pulmonic Valve: The pulmonic valve was grossly normal. Pulmonic valve regurgitation is trivial. Aorta: The aortic root and ascending aorta are structurally normal, with no evidence of dilitation. Venous: The inferior vena cava is normal in size with less than 50% respiratory variability, suggesting right atrial pressure of 8 mmHg. IAS/Shunts: No atrial level shunt detected by color flow Doppler. Additional Comments: A device lead is visualized.  LEFT VENTRICLE PLAX 2D LVIDd:         3.40 cm LVIDs:         2.40 cm LV PW:         1.10 cm LV IVS:        1.00 cm LVOT diam:     2.00 cm LV SV:         51 LV SV Index:   30 LVOT Area:     3.14 cm  RIGHT VENTRICLE            IVC RV Basal diam:  2.90 cm    IVC diam: 1.90 cm RV S prime:     4.28 cm/s TAPSE (M-mode): 0.9 cm LEFT ATRIUM             Index        RIGHT ATRIUM           Index LA diam:        4.80 cm 2.82 cm/m   RA Area:     15.10 cm LA Vol (  A2C):   52.9 ml 31.12 ml/m  RA  Volume:   39.40 ml  23.17 ml/m LA Vol (A4C):   57.1 ml 33.59 ml/m LA Biplane Vol: 59.3 ml 34.88 ml/m  AORTIC VALVE                 PULMONIC VALVE AV Area (Vmax): 1.75 cm     PV Vmax:       0.80 m/s AV Vmax:        155.80 cm/s  PV Peak grad:  2.6 mmHg AV Peak Grad:   9.7 mmHg LVOT Vmax:      86.83 cm/s LVOT Vmean:     54.667 cm/s LVOT VTI:       0.163 m AI PHT:         527 msec  AORTA Ao Root diam: 2.80 cm Ao Asc diam:  3.20 cm MITRAL VALVE                TRICUSPID VALVE MV Area (PHT): 3.13 cm     TR Peak grad:   48.2 mmHg MV Decel Time: 242 msec     TR Vmax:        347.00 cm/s MV E velocity: 129.00 cm/s                             SHUNTS                             Systemic VTI:  0.16 m                             Systemic Diam: 2.00 cm Lyman Bishop MD Electronically signed by Lyman Bishop MD Signature Date/Time: 12/08/2021/11:58:45 AM    Final     Microbiology: Results for orders placed or performed during the hospital encounter of 12/06/21  Resp Panel by RT-PCR (Flu A&B, Covid) Nasopharyngeal Swab     Status: None   Collection Time: 12/06/21 12:06 PM   Specimen: Nasopharyngeal Swab; Nasopharyngeal(NP) swabs in vial transport medium  Result Value Ref Range Status   SARS Coronavirus 2 by RT PCR NEGATIVE NEGATIVE Final    Comment: (NOTE) SARS-CoV-2 target nucleic acids are NOT DETECTED.  The SARS-CoV-2 RNA is generally detectable in upper respiratory specimens during the acute phase of infection. The lowest concentration of SARS-CoV-2 viral copies this assay can detect is 138 copies/mL. A negative result does not preclude SARS-Cov-2 infection and should not be used as the sole basis for treatment or other patient management decisions. A negative result may occur with  improper specimen collection/handling, submission of specimen other than nasopharyngeal swab, presence of viral mutation(s) within the areas targeted by this assay, and inadequate number of viral copies(<138 copies/mL). A  negative result must be combined with clinical observations, patient history, and epidemiological information. The expected result is Negative.  Fact Sheet for Patients:  EntrepreneurPulse.com.au  Fact Sheet for Healthcare Providers:  IncredibleEmployment.be  This test is no t yet approved or cleared by the [place] FDA and  has been authorized for detection and/or diagnosis of SARS-CoV-2 by FDA under an Emergency Use Authorization (EUA). This EUA will remain  in effect (meaning this test can be used) for the duration of the COVID-19 declaration under Section 564(b)(1) of the Act, 21 U.S.C.section 360bbb-3(b)(1), unless the authorization is terminated  or revoked sooner.  Influenza A by PCR NEGATIVE NEGATIVE Final   Influenza B by PCR NEGATIVE NEGATIVE Final    Comment: (NOTE) The Xpert Xpress SARS-CoV-2/FLU/RSV plus assay is intended as an aid in the diagnosis of influenza from Nasopharyngeal swab specimens and should not be used as a sole basis for treatment. Nasal washings and aspirates are unacceptable for Xpert Xpress SARS-CoV-2/FLU/RSV testing.  Fact Sheet for Patients: EntrepreneurPulse.com.au  Fact Sheet for Healthcare Providers: IncredibleEmployment.be  This test is not yet approved or cleared by the [place] FDA and has been authorized for detection and/or diagnosis of SARS-CoV-2 by FDA under an Emergency Use Authorization (EUA). This EUA will remain in effect (meaning this test can be used) for the duration of the COVID-19 declaration under Section 564(b)(1) of the Act, 21 U.S.C. section 360bbb-3(b)(1), unless the authorization is terminated or revoked.  Performed at [facility], [place] [address] [place], [place] [address]   MRSA Next Gen by PCR, Nasal     Status: None   Collection Time: 12/06/21  4:37 PM   Specimen: Nasal Mucosa; Nasal Swab  Result Value Ref Range  Status   MRSA by PCR Next Gen NOT DETECTED NOT DETECTED Final    Comment: (NOTE) The GeneXpert MRSA Assay (FDA approved for NASAL specimens only), is one component of a comprehensive MRSA colonization surveillance program. It is not intended to diagnose MRSA infection nor to guide or monitor treatment for MRSA infections. Test performance is not FDA approved in patients less than 72 years old. Performed at [facility], [place] [address] [place], [place] [address]   Expectorated Sputum Assessment w Gram Stain, Rflx to Resp Cult     Status: None   Collection Time: 12/07/21  6:26 PM   Specimen: Expectorated Sputum  Result Value Ref Range Status   Specimen Description EXPECTORATED SPUTUM  Final   Special Requests EXPECTORATED SPUTUM  Final   Sputum evaluation   Final    THIS SPECIMEN IS ACCEPTABLE FOR SPUTUM CULTURE Performed at [facility], [place] [address], [place], [place] [address]    Report Status 12/08/2021 FINAL  Final  Culture, Respiratory w Gram Stain     Status: None   Collection Time: 12/07/21  6:26 PM  Result Value Ref Range Status   Specimen Description EXPECTORATED SPUTUM  Final   Special Requests EXPECTORATED SPUTUM Reflexed from [id number]  Final   Gram Stain   Final    ABUNDANT WBC PRESENT,BOTH PMN AND MONONUCLEAR FEW GRAM POSITIVE COCCI RARE GRAM VARIABLE ROD RARE YEAST    Culture   Final    FEW Normal respiratory flora-no Staph aureus or Pseudomonas seen Performed at [facility], [place] [address], [place], [place] [address]    Report Status 12/10/2021 FINAL  Final    Labs: CBC: Recent Labs  Lab 12/06/21 1110 12/09/21 0205 12/10/21 0239 12/11/21 0302 12/12/21 0218  WBC 8.3 6.6 5.4 5.0 6.3  NEUTROABS 6.5  --   --   --   --   HGB 12.2 12.1 11.2* 11.2* 11.8*  HCT 37.6 36.4 34.7* 34.1* 36.5  MCV 93.8 92.6 92.5 93.2 91.3  PLT 274 206 182 177 030   Basic Metabolic Panel: Recent Labs  Lab 12/08/21 0320 12/09/21 0205 12/10/21 0239  12/11/21 0302 12/12/21 0218  NA 138 133* 134* 137 140  K 4.5 4.7 5.0 4.3 4.5  CL 104 103 103 103 106  CO2 24 22 22 25 25   GLUCOSE 124* 128* 98  93 131*  BUN 33* 43* 52* 48* 37*  CREATININE 1.50* 1.28* 1.39* 1.19* 1.04*  CALCIUM 9.9 9.4 9.3 9.1 10.1   Liver Function Tests: Recent Labs  Lab 12/06/21 1110  AST 25  ALT 22  ALKPHOS 112  BILITOT 1.0  PROT 6.4*  ALBUMIN 3.2*   CBG: No results for input(s): GLUCAP in the last 168 hours.  Discharge time spent: greater than 30 minutes.  Signed: Tawni Millers, MD [facility] 12/12/2021

## 2021-12-12 NOTE — Progress Notes (Addendum)
Daily Progress Note   Patient Name: Jessica [name]       Date: 12/12/2021 DOB: 1927/02/05  Age: 88 y.o. MRN#: [id number] Attending Physician: Tawni Millers Primary Care Physician: Binnie Rail, MD Admit Date: 12/06/2021  Reason for Consultation/Follow-up: Establishing goals of care  Patient Profile/HPI: 88 y.o. 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 Chest xray with small pleural effusions. Initially improved with diuresis, however, subsequently developed cough, fever and chest xray showing  L lower lobe infiltrate, and started on antibiotics.  Palliative medicine consulted for Jessica [name] due to patient's recent overall decline in status.  Subjective: Chart reviewed including progress notes, labs, and imaging.  Jessica [name] is sitting up in her chair, notes that her bottom hurts. Also has a bothersome cough.  She's pleasantly confused- she believed I was her niece (I'm not)- but it gave her comfort so I didn't correct her.  Noted plans for discharge with Hospice today.   Review of Systems  Respiratory:  Positive for shortness of breath.     Physical Exam Vitals and nursing note reviewed.  Constitutional:      Appearance: She is ill-appearing.     Comments: Frail, cachectic  Neurological:     Mental Status: She is alert.     Comments: Hard of hearing            Vital Signs: BP (!) 152/64 (BP Location: Left Arm)    Pulse 77    Temp 97.7 F (36.5 C) (Oral)    Resp 16    Ht 5\' 4"  (1.626 m)     Wt 66.3 kg    LMP  (LMP Unknown)    SpO2 96%    BMI 25.09 kg/m  SpO2: SpO2: 96 % O2 Device: O2 Device: Nasal Cannula O2 Flow Rate: O2 Flow Rate (L/min): 2 L/min  Intake/output summary:  Intake/Output Summary (Last 24 hours) at 12/12/2021 1652 Last data filed at 12/12/2021 1355 Gross per 24 hour  Intake 560 ml  Output 975 ml  Net -415 ml  LBM: Last BM Date: 12/11/21 Baseline Weight: Weight: 65.9 kg Most recent weight: Weight: 66.3 kg  CBC    Component Value Date/Time   WBC 6.3 12/12/2021 0218   RBC 4.00 12/12/2021 0218   HGB 11.8 (L) 12/12/2021 0218   HGB 13.2 09/03/2020 1637   HCT 36.5 12/12/2021 0218   HCT 39.3 09/03/2020 1637   PLT 210 12/12/2021 0218   PLT 223 09/03/2020 1637   MCV 91.3 12/12/2021 0218   MCV 92 09/03/2020 1637   MCH 29.5 12/12/2021 0218   MCHC 32.3 12/12/2021 0218   RDW 15.5 12/12/2021 0218   RDW 13.4 09/03/2020 1637   LYMPHSABS 1.0 12/06/2021 1110   LYMPHSABS 1.8 12/27/2018 1457   MONOABS 0.5 12/06/2021 1110   EOSABS 0.1 12/06/2021 1110   EOSABS 0.1 12/27/2018 1457   BASOSABS 0.0 12/06/2021 1110   BASOSABS 0.1 12/27/2018 1457   CMP Latest Ref Rng & Units 12/12/2021 12/11/2021 12/10/2021  Glucose 70 - 99 mg/dL 131(H) 93 98  BUN 8 - 23 mg/dL 37(H) 48(H) 52(H)  Creatinine 0.44 - 1.00 mg/dL 1.04(H) 1.19(H) 1.39(H)  Sodium 135 - 145 mmol/L 140 137 134(L)  Potassium 3.5 - 5.1 mmol/L 4.5 4.3 5.0  Chloride 98 - 111 mmol/L 106 103 103  CO2 22 - 32 mmol/L 25 25 22   Calcium 8.9 - 10.3 mg/dL 10.1 9.1 9.3  Total Protein 6.5 - 8.1 g/dL - - -  Total Bilirubin 0.3 - 1.2 mg/dL - - -  Alkaline Phos 38 - 126 U/L - - -  AST 15 - 41 U/L - - -  ALT 0 - 44 U/L - - -            Palliative Assessment/Data: PPS:  20%      Patient Active Problem List   Diagnosis Date Noted   Goals of care, counseling/discussion 12/10/2021   CAP (community acquired pneumonia) 12/09/2021   Pressure injury of skin 12/07/2021   Elevated troponin 12/06/2021   Rib fractures  12/06/2021   Hypercalcemia 12/06/2021   Memory loss 12/06/2021   DNR (do not resuscitate) 12/06/2021   Chest pain 09/15/2021   Neoplasm of uncertain behavior of skin 08/23/2021   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 08/23/2021   Atrial fibrillation ([place]) 07/09/2021   Bed sore 07/09/2021   Acute hypoxemic respiratory failure due to COVID-19 ([place]) 06/10/2021   Acute respiratory failure with hypoxia secondary to heart failure with reduced EF 06/10/2021   Abdominal bloating 03/09/2020   Chronic constipation 03/09/2020   H/O bilateral mastectomy 10/13/2018   HX: breast cancer 10/13/2018   Cardiac pacemaker in situ    Acute on chronic combined systolic and diastolic CHF (congestive heart failure) ([place]) 10/12/2017   UTI (urinary tract infection) 03/28/2017   Bilateral leg edema 04/29/2016   Acute kidney injury superimposed on chronic kidney disease ([place]) 06/10/2015   CAD (coronary artery disease) 06/09/2015   Diabetic neuropathy ([place]) 07/06/2013   SSS (sick sinus syndrome) ([place]) 02/10/2013   Essential hypertension 07/30/2012   Pacemaker-[employer] 07/19/2012   Benign hypertensive heart disease with heart failure ([place]) 01/28/2012   Type 2 diabetes mellitus with diabetic neuropathy, unspecified ([place]) 10/22/2010   HYPERCHOLESTEROLEMIA 10/22/2010   HYPERTENSION 10/22/2010   DEGENERATIVE JOINT DISEASE 10/22/2010    Palliative Care Assessment & Plan    Assessment/Recommendations/Plan  Failure to thrive in the setting of heart failure, recent Covid, recovery complicated by pneumonia Continue current care Plan for discharge home with Hospice today   Code  Status: DNR  Prognosis:  < 6 months  Discharge Planning: Home with Hospice  Care plan was discussed with patient's daughter, Jessica [name].   Thank you for allowing the Palliative Medicine Team to assist in the care of this patient.  Greater than 50%  of this time was spent counseling and coordinating care related to the  above assessment and plan.  Jessica [name], AGNP-C Palliative Medicine   Please contact Palliative Medicine Team phone at [phone] for questions and concerns.

## 2021-12-12 NOTE — Plan of Care (Signed)
°  Problem: Clinical Measurements: Goal: Ability to maintain clinical measurements within normal limits will improve Outcome: Progressing  Problem: Clinical Measurements: Goal: Diagnostic test results will improve Outcome: Progressing   Problem: Clinical Measurements: Goal: Respiratory complications will improve Outcome: Progressing   Problem: Clinical Measurements: Goal: Cardiovascular complication will be avoided Outcome: Progressing   Problem: Activity: Goal: Risk for activity intolerance will decrease Outcome: Progressing   Problem: Nutrition: Goal: Adequate nutrition will be maintained Outcome: Progressing   Problem: Coping: Goal: Level of anxiety will decrease Outcome: Progressing   Problem: Pain Managment: Goal: General experience of comfort will improve Outcome: Progressing   Problem: Safety: Goal: Ability to remain free from injury will improve Outcome: Progressing   Problem: Skin Integrity: Goal: Risk for impaired skin integrity will decrease Outcome: Progressing   Problem: Cardiac: Goal: Ability to achieve and maintain adequate cardiopulmonary perfusion will improve Outcome: Progressing

## 2021-12-12 NOTE — Progress Notes (Signed)
Mobility Specialist Progress Note:   12/12/21 1545  Mobility  Range of Motion/Exercises All extremities;Passive;Active  Assistive Device None  Activity Response Tolerated well  $Mobility charge 1 Mobility   Pt agreeable to bed exercises this afternoon. Performed multiple bed level exercises with all extremities. Pt tolerated well, repositioned in bed with bed alarm on.   Nelta Numbers Mobility Specialist  Phone [phone]

## 2021-12-12 NOTE — Progress Notes (Signed)
Patient d/c home per order d/c instruction explain  via phone and copy given to daughter by PTAR.

## 2021-12-12 NOTE — Telephone Encounter (Signed)
Spoke with Tammy today.

## 2021-12-12 NOTE — TOC Transition Note (Signed)
Transition of Care [facility]) - CM/SW Discharge Note   Patient Details  Name: Jessica [name] MRN: [id number] Date of Birth: 04-Aug-1927  [facility]) CM/SW Contact:  Zenon Mayo, RN Phone Number: 12/12/2021, 1:03 PM   Clinical Narrative:    Patient is for dc today, home with hospice via ptar, which is scheduled for 3 pm transport.  NCM spoke with [name] and she requested pick up for 3 pm.  Fabio Pierce with [facility] is aware.  DNR in packet and signe, letter stating ambulance is medical necessity is in packet also on the unit.    Final next level of care: Home w Hospice Care Barriers to Discharge: No Barriers Identified   Patient Goals and CMS Choice Patient states their goals for this hospitalization and ongoing recovery are:: home with hospice CMS Medicare.gov Compare Post Acute Care list provided to:: Patient Represenative (must comment) Choice offered to / list presented to : Adult Children  Discharge Placement                       Discharge Plan and Services In-house Referral: NA Discharge Planning Services: CM Consult Post Acute Care Choice: Home Health, Resumption of Svcs/PTA Provider            DME Agency: NA       HH Arranged: RN [place] Agency:  ([place]) Date [place]: 12/12/21 Time [place]: 1300 Representative spoke with at [place]: Dietitian  Social Determinants of Health ([place]) Interventions     Readmission Risk Interventions No flowsheet data found.

## 2021-12-12 NOTE — Telephone Encounter (Signed)
yes

## 2021-12-13 ENCOUNTER — Telehealth: Payer: Self-pay | Admitting: Internal Medicine

## 2021-12-13 NOTE — Telephone Encounter (Signed)
Jessica [name] w/ [facility] inquiring if provider  recommends pt continuing furosemide (LASIX) 20 MG tablet caller states pt is not retaining fluid  Caller states pt is very congested , weak, and has a cough  Caller inquiring if pt should receive a nebulizer w/ albuterol or if provider has any other suggestions  Phone [phone]- Jessica [name]

## 2021-12-13 NOTE — Telephone Encounter (Signed)
She probably should continue the Lasix at least 3 times a week-that is why she was in the hospital and referred if she goes without any she will be fluid overloaded very quickly.  I would recommend her daughter sees if they can move up her cardiology appointment.  Okay to try a nebulizer.  We can consider doing blood work to reevaluate her kidney function and BNP to measure degree of heart failure.

## 2021-12-13 NOTE — Telephone Encounter (Signed)
Spoke with Jessica [name] today

## 2021-12-17 ENCOUNTER — Other Ambulatory Visit (HOSPITAL_COMMUNITY): Payer: Self-pay

## 2022-01-01 DEATH — deceased

## 2022-01-07 ENCOUNTER — Ambulatory Visit: Payer: Medicare Other | Admitting: Internal Medicine

## 2022-02-05 ENCOUNTER — Ambulatory Visit: Payer: Medicare Other | Admitting: Cardiology
